# Patient Record
Sex: Male | Born: 1970 | Race: White | Hispanic: No | Marital: Single
Health system: Southern US, Academic
[De-identification: ages and names within clinical notes are randomized; demographics above are authoritative.]

## PROBLEM LIST (undated history)

## (undated) ENCOUNTER — Ambulatory Visit

## (undated) ENCOUNTER — Encounter

## (undated) ENCOUNTER — Encounter: Attending: Orthopaedic Surgery | Primary: Orthopaedic Surgery

## (undated) ENCOUNTER — Telehealth

## (undated) ENCOUNTER — Encounter: Attending: Rheumatology | Primary: Rheumatology

## (undated) ENCOUNTER — Encounter
Attending: Student in an Organized Health Care Education/Training Program | Primary: Student in an Organized Health Care Education/Training Program

## (undated) ENCOUNTER — Ambulatory Visit: Payer: PRIVATE HEALTH INSURANCE

## (undated) ENCOUNTER — Encounter: Attending: Internal Medicine | Primary: Internal Medicine

## (undated) ENCOUNTER — Telehealth
Attending: Student in an Organized Health Care Education/Training Program | Primary: Student in an Organized Health Care Education/Training Program

## (undated) ENCOUNTER — Ambulatory Visit: Attending: Rheumatology | Primary: Rheumatology

## (undated) ENCOUNTER — Ambulatory Visit: Attending: Critical Care Medicine | Primary: Critical Care Medicine

## (undated) ENCOUNTER — Ambulatory Visit
Payer: MEDICARE | Attending: Student in an Organized Health Care Education/Training Program | Primary: Student in an Organized Health Care Education/Training Program

## (undated) ENCOUNTER — Telehealth: Attending: Orthopaedic Surgery | Primary: Orthopaedic Surgery

## (undated) ENCOUNTER — Ambulatory Visit: Attending: Orthopaedic Surgery | Primary: Orthopaedic Surgery

## (undated) ENCOUNTER — Ambulatory Visit: Payer: MEDICARE

## (undated) ENCOUNTER — Telehealth: Attending: Ambulatory Care | Primary: Ambulatory Care

## (undated) ENCOUNTER — Ambulatory Visit: Attending: Pharmacist | Primary: Pharmacist

## (undated) ENCOUNTER — Encounter: Attending: Otolaryngology | Primary: Otolaryngology

## (undated) ENCOUNTER — Encounter: Attending: Ambulatory Care | Primary: Ambulatory Care

## (undated) ENCOUNTER — Ambulatory Visit: Payer: Medicaid (Managed Care)

## (undated) ENCOUNTER — Ambulatory Visit: Payer: MEDICAID | Attending: Medical | Primary: Medical

## (undated) ENCOUNTER — Encounter: Payer: MEDICARE | Attending: Internal Medicine | Primary: Internal Medicine

## (undated) ENCOUNTER — Ambulatory Visit: Payer: MEDICARE | Attending: Orthopaedic Surgery | Primary: Orthopaedic Surgery

## (undated) ENCOUNTER — Encounter: Attending: Critical Care Medicine | Primary: Critical Care Medicine

## (undated) ENCOUNTER — Ambulatory Visit
Attending: Student in an Organized Health Care Education/Training Program | Primary: Student in an Organized Health Care Education/Training Program

## (undated) ENCOUNTER — Ambulatory Visit: Attending: Ambulatory Care | Primary: Ambulatory Care

## (undated) ENCOUNTER — Ambulatory Visit: Payer: MEDICARE | Attending: Internal Medicine | Primary: Internal Medicine

## (undated) DIAGNOSIS — M0520 Rheumatoid vasculitis with rheumatoid arthritis of unspecified site: Secondary | ICD-10-CM

## (undated) DIAGNOSIS — F419 Anxiety disorder, unspecified: Secondary | ICD-10-CM

## (undated) DIAGNOSIS — B1920 Unspecified viral hepatitis C without hepatic coma: Secondary | ICD-10-CM

## (undated) DIAGNOSIS — T7840XA Allergy, unspecified, initial encounter: Secondary | ICD-10-CM

## (undated) DIAGNOSIS — J449 Chronic obstructive pulmonary disease, unspecified: Secondary | ICD-10-CM

## (undated) DIAGNOSIS — M8700 Idiopathic aseptic necrosis of unspecified bone: Secondary | ICD-10-CM

## (undated) DIAGNOSIS — F1010 Alcohol abuse, uncomplicated: Secondary | ICD-10-CM

## (undated) DIAGNOSIS — G4730 Sleep apnea, unspecified: Secondary | ICD-10-CM

## (undated) DIAGNOSIS — I4891 Unspecified atrial fibrillation: Secondary | ICD-10-CM

## (undated) DIAGNOSIS — F1910 Other psychoactive substance abuse, uncomplicated: Secondary | ICD-10-CM

## (undated) DIAGNOSIS — E785 Hyperlipidemia, unspecified: Secondary | ICD-10-CM

## (undated) DIAGNOSIS — K219 Gastro-esophageal reflux disease without esophagitis: Secondary | ICD-10-CM

## (undated) DIAGNOSIS — G8929 Other chronic pain: Secondary | ICD-10-CM

## (undated) DIAGNOSIS — R569 Unspecified convulsions: Secondary | ICD-10-CM

## (undated) DIAGNOSIS — J45909 Unspecified asthma, uncomplicated: Secondary | ICD-10-CM

## (undated) DIAGNOSIS — Z87898 Personal history of other specified conditions: Secondary | ICD-10-CM

## (undated) DIAGNOSIS — M549 Dorsalgia, unspecified: Secondary | ICD-10-CM

## (undated) DIAGNOSIS — G629 Polyneuropathy, unspecified: Secondary | ICD-10-CM

## (undated) DIAGNOSIS — F32A Depression, unspecified: Secondary | ICD-10-CM

## (undated) DIAGNOSIS — I10 Essential (primary) hypertension: Secondary | ICD-10-CM

## (undated) DIAGNOSIS — I714 Abdominal aortic aneurysm, without rupture, unspecified: Secondary | ICD-10-CM

## (undated) DIAGNOSIS — F329 Major depressive disorder, single episode, unspecified: Secondary | ICD-10-CM

## (undated) DIAGNOSIS — Z5189 Encounter for other specified aftercare: Secondary | ICD-10-CM

## (undated) HISTORY — PX: APPENDECTOMY: SHX54

## (undated) HISTORY — DX: Encounter for other specified aftercare: Z51.89

## (undated) HISTORY — PX: CARPAL TUNNEL RELEASE: SHX101

## (undated) HISTORY — PX: CERVICAL FUSION: SHX112

## (undated) HISTORY — DX: Hyperlipidemia, unspecified: E78.5

## (undated) HISTORY — DX: Abdominal aortic aneurysm, without rupture, unspecified: I71.40

## (undated) HISTORY — DX: Sleep apnea, unspecified: G47.30

## (undated) HISTORY — DX: Chronic obstructive pulmonary disease, unspecified: J44.9

## (undated) HISTORY — DX: Allergy, unspecified, initial encounter: T78.40XA

## (undated) MED ORDER — BD TUBERCULIN SYRINGE 1 ML 25 GAUGE X 5/8": each | 0 refills | 0 days

---
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
## 1998-10-08 ENCOUNTER — Emergency Department (HOSPITAL_COMMUNITY): Admission: EM | Admit: 1998-10-08 | Discharge: 1998-10-08 | Payer: Self-pay | Admitting: Emergency Medicine

## 1998-10-08 ENCOUNTER — Encounter: Payer: Self-pay | Admitting: Emergency Medicine

## 1998-10-15 ENCOUNTER — Encounter: Payer: Self-pay | Admitting: Emergency Medicine

## 1998-10-15 ENCOUNTER — Emergency Department (HOSPITAL_COMMUNITY): Admission: EM | Admit: 1998-10-15 | Discharge: 1998-10-15 | Payer: Self-pay | Admitting: Emergency Medicine

## 1999-05-22 ENCOUNTER — Emergency Department (HOSPITAL_COMMUNITY): Admission: EM | Admit: 1999-05-22 | Discharge: 1999-05-22 | Payer: Self-pay | Admitting: Emergency Medicine

## 1999-05-22 ENCOUNTER — Encounter: Payer: Self-pay | Admitting: Emergency Medicine

## 1999-05-23 ENCOUNTER — Emergency Department (HOSPITAL_COMMUNITY): Admission: EM | Admit: 1999-05-23 | Discharge: 1999-05-23 | Payer: Self-pay | Admitting: *Deleted

## 2003-09-19 ENCOUNTER — Emergency Department (HOSPITAL_COMMUNITY): Admission: EM | Admit: 2003-09-19 | Discharge: 2003-09-19 | Payer: Self-pay | Admitting: Emergency Medicine

## 2005-03-31 ENCOUNTER — Emergency Department (HOSPITAL_COMMUNITY): Admission: AD | Admit: 2005-03-31 | Discharge: 2005-03-31 | Payer: Self-pay | Admitting: Family Medicine

## 2006-03-06 ENCOUNTER — Ambulatory Visit: Payer: Self-pay | Admitting: Internal Medicine

## 2006-03-06 ENCOUNTER — Inpatient Hospital Stay (HOSPITAL_COMMUNITY): Admission: EM | Admit: 2006-03-06 | Discharge: 2006-03-07 | Payer: Self-pay | Admitting: Family Medicine

## 2006-03-07 ENCOUNTER — Encounter: Payer: Self-pay | Admitting: Cardiology

## 2006-03-07 HISTORY — PX: CARDIOVERSION: SHX1299

## 2006-04-14 ENCOUNTER — Ambulatory Visit: Payer: Self-pay | Admitting: Internal Medicine

## 2006-06-15 ENCOUNTER — Emergency Department (HOSPITAL_COMMUNITY): Admission: EM | Admit: 2006-06-15 | Discharge: 2006-06-15 | Payer: Self-pay | Admitting: Emergency Medicine

## 2007-12-09 ENCOUNTER — Emergency Department (HOSPITAL_COMMUNITY): Admission: EM | Admit: 2007-12-09 | Discharge: 2007-12-09 | Payer: Self-pay | Admitting: Emergency Medicine

## 2008-07-06 ENCOUNTER — Emergency Department (HOSPITAL_COMMUNITY): Admission: EM | Admit: 2008-07-06 | Discharge: 2008-07-06 | Payer: Self-pay | Admitting: Family Medicine

## 2009-06-08 DIAGNOSIS — I4891 Unspecified atrial fibrillation: Secondary | ICD-10-CM | POA: Insufficient documentation

## 2009-06-26 ENCOUNTER — Emergency Department (HOSPITAL_COMMUNITY): Admission: EM | Admit: 2009-06-26 | Discharge: 2009-06-26 | Payer: Self-pay | Admitting: Emergency Medicine

## 2009-08-16 ENCOUNTER — Emergency Department (HOSPITAL_COMMUNITY): Admission: EM | Admit: 2009-08-16 | Discharge: 2009-08-16 | Payer: Self-pay | Admitting: Emergency Medicine

## 2009-10-25 ENCOUNTER — Emergency Department (HOSPITAL_COMMUNITY): Admission: EM | Admit: 2009-10-25 | Discharge: 2009-10-25 | Payer: Self-pay | Admitting: Emergency Medicine

## 2009-10-27 ENCOUNTER — Emergency Department (HOSPITAL_BASED_OUTPATIENT_CLINIC_OR_DEPARTMENT_OTHER): Admission: EM | Admit: 2009-10-27 | Discharge: 2009-10-27 | Payer: Self-pay | Admitting: Emergency Medicine

## 2009-10-27 ENCOUNTER — Ambulatory Visit: Payer: Self-pay | Admitting: Diagnostic Radiology

## 2009-11-06 ENCOUNTER — Telehealth (INDEPENDENT_AMBULATORY_CARE_PROVIDER_SITE_OTHER): Payer: Self-pay

## 2009-12-10 ENCOUNTER — Emergency Department (HOSPITAL_COMMUNITY): Admission: EM | Admit: 2009-12-10 | Discharge: 2009-12-10 | Payer: Self-pay | Admitting: Emergency Medicine

## 2010-01-10 ENCOUNTER — Emergency Department (HOSPITAL_COMMUNITY)
Admission: EM | Admit: 2010-01-10 | Discharge: 2010-01-10 | Payer: Self-pay | Source: Home / Self Care | Admitting: Emergency Medicine

## 2010-02-09 ENCOUNTER — Ambulatory Visit: Payer: Self-pay | Admitting: Internal Medicine

## 2010-02-09 ENCOUNTER — Encounter: Payer: Self-pay | Admitting: Internal Medicine

## 2010-02-09 DIAGNOSIS — F17200 Nicotine dependence, unspecified, uncomplicated: Secondary | ICD-10-CM | POA: Insufficient documentation

## 2010-02-09 DIAGNOSIS — F1910 Other psychoactive substance abuse, uncomplicated: Secondary | ICD-10-CM | POA: Insufficient documentation

## 2010-02-09 HISTORY — DX: Other psychoactive substance abuse, uncomplicated: F19.10

## 2010-02-13 ENCOUNTER — Encounter: Payer: Self-pay | Admitting: Internal Medicine

## 2010-03-15 NOTE — Progress Notes (Signed)
  Phone Note Other Incoming   Request: Send information Summary of Call: Request for records received from Disability Determination Services. Forwarded to [employer].

## 2010-03-15 NOTE — Assessment & Plan Note (Signed)
Summary: ec6  Medications Added ASPIRIN 325 MG  TABS (ASPIRIN) as needed BENADRYL 25 MG CAPS (DIPHENHYDRAMINE HCL) 1 tab by mouth at bedtime        History of Present Illness: patient is a 40 year old with a history of atrial fibrillation in past (2008, underwent D/C cardioverisoin).  Also has a history of cocain use, pain pilss.  Has been through rehab.  Admits to using cocaine recently. The patient presents for continued care.  He notes occasional fast heart beat.  Longest lasts 30 minutes.  No dizziness.   No signif SOB.  No chest pains.  Still smoking.  Drinks 12 beers per day.  Current Medications (verified): 1)  Aspirin 325 Mg  Tabs (Aspirin) .... As Needed 2)  Benadryl 25 Mg Caps (Diphenhydramine Hcl) .[name].. 1 Tab By Mouth At Bedtime  Allergies: No Known Drug Allergies  Past History:  Past Medical History: Last updated: 06/08/2009  Atrial Fibrillation  alcohol abuse  cocaine abuse   Current Problems:  FIBRILLATION, ATRIAL (ICD-427.31)  Past Surgical History: Last updated: 06/08/2009 NONE  Family History: Last updated: 06/08/2009  Negative for A fib.  Social History: Last updated: 06/08/2009 Tobacco Use - Yes. 2010, trying to quit Alcohol Use - yes  heavy drinker 2010 Drug Use - yes,  last cocaine use 6-7 months ago May 2010  Review of Systems       All systems reviewed.  Neg  to the above problem except as noted above.  Vital Signs:  Patient profile:   40 year old male Height:      68 inches Weight:      169 pounds BMI:     25.79 Pulse rate:   100 / minute Resp:     14 per minute BP sitting:   145 / 85  (left arm)  Vitals Entered By: Kem Parkinson (February 09, 2010 2:53 PM)  Physical Exam  Additional Exam:  Patient is in NAD HEENT:  Normocephalic, atraumatic. EOMI, PERRLA.  Neck: JVP is normal. No thyromegaly. No bruits.  Lungs: clear to auscultation. No rales no wheezes.  Heart: Regular rate and rhythm. Normal S1, S2. No S3.   No  significant murmurs. PMI not displaced.  Abdomen:  Supple, nontender. Normal bowel sounds. No masses. No hepatomegaly.  Extremities:   Good distal pulses throughout. No lower extremity edema.  Musculoskeletal :moving all extremities.  Neuro:   alert and oriented x3.    EKG  Procedure date:  02/09/2010  Findings:      Sinus rhythm.   99 bpm.  Anterior MI  Impression & Recommendations:  Problem # 1:  FIBRILLATION, ATRIAL (ICD-427.31) Patient with infrequent spells by history.  I would keep him on ASA alone.  Cut back on ETOH.  No cocaine. His updated medication list for this problem includes:    Aspirin 325 Mg Tabs (Aspirin) .[name]... As needed  Problem # 2:  SUBSTANCE ABUSE (ICD-305.90) Counselled.  Problem # 3:  TOBACCO ABUSE (ICD-305.1) Counselled on cessation.  Patient Instructions: 1)  Availble as needed.  Otherwise f/u in 1 year.

## 2010-03-27 ENCOUNTER — Emergency Department (HOSPITAL_COMMUNITY): Payer: Self-pay

## 2010-03-27 ENCOUNTER — Emergency Department (HOSPITAL_COMMUNITY)
Admission: EM | Admit: 2010-03-27 | Discharge: 2010-03-28 | Disposition: A | Discharge status: Home / Self Care | Payer: Self-pay | Attending: Emergency Medicine | Admitting: Emergency Medicine

## 2010-03-27 DIAGNOSIS — F329 Major depressive disorder, single episode, unspecified: Secondary | ICD-10-CM | POA: Insufficient documentation

## 2010-03-27 DIAGNOSIS — R059 Cough, unspecified: Secondary | ICD-10-CM | POA: Insufficient documentation

## 2010-03-27 DIAGNOSIS — I4891 Unspecified atrial fibrillation: Secondary | ICD-10-CM | POA: Insufficient documentation

## 2010-03-27 DIAGNOSIS — R05 Cough: Secondary | ICD-10-CM | POA: Insufficient documentation

## 2010-03-27 DIAGNOSIS — R079 Chest pain, unspecified: Secondary | ICD-10-CM | POA: Insufficient documentation

## 2010-03-27 DIAGNOSIS — F3289 Other specified depressive episodes: Secondary | ICD-10-CM | POA: Insufficient documentation

## 2010-03-27 DIAGNOSIS — R1013 Epigastric pain: Secondary | ICD-10-CM | POA: Insufficient documentation

## 2010-03-27 DIAGNOSIS — E871 Hypo-osmolality and hyponatremia: Secondary | ICD-10-CM | POA: Insufficient documentation

## 2010-03-27 DIAGNOSIS — R0989 Other specified symptoms and signs involving the circulatory and respiratory systems: Secondary | ICD-10-CM | POA: Insufficient documentation

## 2010-03-27 DIAGNOSIS — Z8619 Personal history of other infectious and parasitic diseases: Secondary | ICD-10-CM | POA: Insufficient documentation

## 2010-03-27 DIAGNOSIS — M549 Dorsalgia, unspecified: Secondary | ICD-10-CM | POA: Insufficient documentation

## 2010-03-27 DIAGNOSIS — R197 Diarrhea, unspecified: Secondary | ICD-10-CM | POA: Insufficient documentation

## 2010-03-27 DIAGNOSIS — R0609 Other forms of dyspnea: Secondary | ICD-10-CM | POA: Insufficient documentation

## 2010-03-27 DIAGNOSIS — G8929 Other chronic pain: Secondary | ICD-10-CM | POA: Insufficient documentation

## 2010-03-27 DIAGNOSIS — F1910 Other psychoactive substance abuse, uncomplicated: Secondary | ICD-10-CM | POA: Insufficient documentation

## 2010-03-27 DIAGNOSIS — R112 Nausea with vomiting, unspecified: Secondary | ICD-10-CM | POA: Insufficient documentation

## 2010-03-27 LAB — BASIC METABOLIC PANEL
CO2: 15 mEq/L — ABNORMAL LOW (ref 19–32)
Calcium: 8.7 mg/dL (ref 8.4–10.5)
Chloride: 93 mEq/L — ABNORMAL LOW (ref 96–112)
Creatinine, Ser: 0.94 mg/dL (ref 0.4–1.5)
GFR calc Af Amer: >60 mL/min (ref >60)
GFR calc non Af Amer: >60 mL/min (ref >60)
Sodium: 127 mEq/L — ABNORMAL LOW (ref 135–145)

## 2010-03-27 LAB — HEPATIC FUNCTION PANEL
ALT: 45 U/L (ref 0–53)
AST: 41 U/L — ABNORMAL HIGH (ref 0–37)
Albumin: 4.2 g/dL (ref 3.5–5.2)
Alkaline Phosphatase: 57 U/L (ref 39–117)
Total Bilirubin: 0.7 mg/dL (ref 0.3–1.2)

## 2010-03-27 LAB — POCT I-STAT, CHEM 8
Calcium, Ion: 0.95 mmol/L — ABNORMAL LOW (ref 1.12–1.32)
Chloride: 101 mEq/L (ref 96–112)
Glucose, Bld: 79 mg/dL (ref 70–99)
HCT: 57 % — ABNORMAL HIGH (ref 39.0–52.0)
Hemoglobin: 19.4 g/dL — ABNORMAL HIGH (ref 13.0–17.0)

## 2010-03-27 LAB — RAPID URINE DRUG SCREEN, HOSP PERFORMED
Barbiturates: NOT DETECTED
Benzodiazepines: NOT DETECTED
Cocaine: POSITIVE — AB

## 2010-03-27 LAB — APTT: aPTT: 26 seconds (ref 24–37)

## 2010-03-27 LAB — ACETAMINOPHEN LEVEL: Acetaminophen (Tylenol), Serum: <10 ug/mL — ABNORMAL LOW (ref 10–30)

## 2010-03-27 LAB — DIFFERENTIAL
Basophils Absolute: 0 10*3/uL (ref 0.0–0.1)
Basophils Relative: 0 % (ref 0–1)
Neutrophils Relative %: 80 % — ABNORMAL HIGH (ref 43–77)

## 2010-03-27 LAB — CBC
MCH: 33.8 pg (ref 26.0–34.0)
Platelets: 192 10*3/uL (ref 150–400)
RDW: 12.8 % (ref 11.5–15.5)

## 2010-03-27 LAB — POCT CARDIAC MARKERS: CKMB, poc: 1 ng/mL (ref 1.0–8.0)

## 2010-03-27 LAB — PROTIME-INR: Prothrombin Time: 12.4 seconds (ref 11.6–15.2)

## 2010-03-28 LAB — URINALYSIS, ROUTINE W REFLEX MICROSCOPIC
Bilirubin Urine: NEGATIVE
Ketones, ur: 15 mg/dL — AB
Nitrite: NEGATIVE
Urine Glucose, Fasting: NEGATIVE mg/dL
Urobilinogen, UA: 1 mg/dL (ref 0.0–1.0)
pH: 5.5 (ref 5.0–8.0)

## 2010-04-24 LAB — URINALYSIS, ROUTINE W REFLEX MICROSCOPIC
Glucose, UA: NEGATIVE mg/dL
Hgb urine dipstick: NEGATIVE
pH: 7.5 (ref 5.0–8.0)

## 2010-04-24 LAB — DIFFERENTIAL
Lymphocytes Relative: 20 % (ref 12–46)
Monocytes Relative: 8 % (ref 3–12)
Neutro Abs: 8 10*3/uL — ABNORMAL HIGH (ref 1.7–7.7)
Neutrophils Relative %: 70 % (ref 43–77)

## 2010-04-24 LAB — COMPREHENSIVE METABOLIC PANEL
ALT: 30 U/L (ref 0–53)
Alkaline Phosphatase: 65 U/L (ref 39–117)
Creatinine, Ser: 0.83 mg/dL (ref 0.4–1.5)
GFR calc Af Amer: >60 mL/min (ref >60)
Potassium: 3.7 mEq/L (ref 3.5–5.1)
Total Protein: 7.5 g/dL (ref 6.0–8.3)

## 2010-04-24 LAB — CBC: WBC: 11.4 10*3/uL — ABNORMAL HIGH (ref 4.0–10.5)

## 2010-04-25 LAB — URINALYSIS, ROUTINE W REFLEX MICROSCOPIC
Bilirubin Urine: NEGATIVE
Glucose, UA: NEGATIVE mg/dL
Hgb urine dipstick: NEGATIVE
Ketones, ur: NEGATIVE mg/dL
Protein, ur: NEGATIVE mg/dL
Urobilinogen, UA: 0.2 mg/dL (ref 0.0–1.0)

## 2010-04-25 LAB — POCT CARDIAC MARKERS
CKMB, poc: <1 ng/mL — ABNORMAL LOW (ref 1.0–8.0)
Myoglobin, poc: 36.7 ng/mL (ref 12–200)
Troponin i, poc: <0.05 ng/mL (ref 0.00–0.09)

## 2010-04-25 LAB — DIFFERENTIAL
Basophils Absolute: 0 10*3/uL (ref 0.0–0.1)
Basophils Relative: 0 % (ref 0–1)
Eosinophils Absolute: 0.3 10*3/uL (ref 0.0–0.7)
Eosinophils Relative: 4 % (ref 0–5)
Lymphocytes Relative: 22 % (ref 12–46)
Monocytes Absolute: 0.7 10*3/uL (ref 0.1–1.0)

## 2010-04-25 LAB — CBC
HCT: 50.5 % (ref 39.0–52.0)
MCH: 33.3 pg (ref 26.0–34.0)
MCHC: 34.7 g/dL (ref 30.0–36.0)
MCV: 96 fL (ref 78.0–100.0)
Platelets: 188 10*3/uL (ref 150–400)
RDW: 13 % (ref 11.5–15.5)
WBC: 9.2 10*3/uL (ref 4.0–10.5)

## 2010-04-25 LAB — POCT I-STAT, CHEM 8
Chloride: 108 mEq/L (ref 96–112)
Creatinine, Ser: 1.1 mg/dL (ref 0.4–1.5)
Glucose, Bld: 107 mg/dL — ABNORMAL HIGH (ref 70–99)
HCT: 53 % — ABNORMAL HIGH (ref 39.0–52.0)
Hemoglobin: 18 g/dL — ABNORMAL HIGH (ref 13.0–17.0)
Potassium: 4 mEq/L (ref 3.5–5.1)
Sodium: 139 mEq/L (ref 135–145)

## 2010-04-25 LAB — RAPID URINE DRUG SCREEN, HOSP PERFORMED
Barbiturates: NOT DETECTED
Benzodiazepines: NOT DETECTED

## 2010-04-26 LAB — COMPREHENSIVE METABOLIC PANEL
AST: 33 U/L (ref 0–37)
Albumin: 4.1 g/dL (ref 3.5–5.2)
Alkaline Phosphatase: 66 U/L (ref 39–117)
Alkaline Phosphatase: 60 U/L (ref 39–117)
BUN: 5 mg/dL — ABNORMAL LOW (ref 6–23)
BUN: 14 mg/dL (ref 6–23)
CO2: 25 mEq/L (ref 19–32)
Chloride: 108 mEq/L (ref 96–112)
Chloride: 106 mEq/L (ref 96–112)
GFR calc Af Amer: >60 mL/min (ref >60)
GFR calc non Af Amer: >60 mL/min (ref >60)
Glucose, Bld: 79 mg/dL (ref 70–99)
Potassium: 3.9 mEq/L (ref 3.5–5.1)
Potassium: 4.1 mEq/L (ref 3.5–5.1)
Total Bilirubin: 0.6 mg/dL (ref 0.3–1.2)
Total Bilirubin: 0.7 mg/dL (ref 0.3–1.2)

## 2010-04-26 LAB — URINALYSIS, ROUTINE W REFLEX MICROSCOPIC
Bilirubin Urine: NEGATIVE
Hgb urine dipstick: NEGATIVE
Nitrite: NEGATIVE
Protein, ur: NEGATIVE mg/dL
Urobilinogen, UA: 0.2 mg/dL (ref 0.0–1.0)

## 2010-04-26 LAB — DIFFERENTIAL
Basophils Absolute: 0.2 10*3/uL — ABNORMAL HIGH (ref 0.0–0.1)
Basophils Relative: 3 % — ABNORMAL HIGH (ref 0–1)
Monocytes Absolute: 0.6 10*3/uL (ref 0.1–1.0)
Neutro Abs: 3.8 10*3/uL (ref 1.7–7.7)
Neutrophils Relative %: 51 % (ref 43–77)

## 2010-04-26 LAB — POCT CARDIAC MARKERS
CKMB, poc: 2 ng/mL (ref 1.0–8.0)
Troponin i, poc: <0.05 ng/mL (ref 0.00–0.09)

## 2010-04-26 LAB — CBC
HCT: 46.5 % (ref 39.0–52.0)
MCH: 33.1 pg (ref 26.0–34.0)
MCV: 94.5 fL (ref 78.0–100.0)
MCV: 93 fL (ref 78.0–100.0)
Platelets: 210 10*3/uL (ref 150–400)
RBC: 4.92 MIL/uL (ref 4.22–5.81)
RBC: 5.16 MIL/uL (ref 4.22–5.81)
WBC: 7.5 10*3/uL (ref 4.0–10.5)
WBC: 10.1 10*3/uL (ref 4.0–10.5)

## 2010-04-26 LAB — POCT TOXICOLOGY PANEL

## 2010-04-26 LAB — RAPID URINE DRUG SCREEN, HOSP PERFORMED: Barbiturates: NOT DETECTED

## 2010-05-22 LAB — POCT URINALYSIS DIP (DEVICE)
Protein, ur: NEGATIVE mg/dL
Urobilinogen, UA: 0.2 mg/dL (ref 0.0–1.0)

## 2010-05-22 LAB — GLUCOSE, CAPILLARY: Glucose-Capillary: 100 mg/dL — ABNORMAL HIGH (ref 70–99)

## 2010-06-29 NOTE — Assessment & Plan Note (Signed)
[facility]                            CARDIOLOGY OFFICE NOTE   NAME:Sick, MCCOY TESTA                     MRN:          [id number]  DATE:04/14/2006                            DOB:          27-Sep-1970    IDENTIFICATION:  Mr. Usery is a 40 year old gentleman who was admitted  in January to [facility] for atrial fibrillation.  He has a  history of polysubstance abuse.  Woke up feeling bad.  Came to the  emergency room, found to be in atrial fibrillation with a rate of 148  beats per minute.  He actually underwent cardioversion electrically for  this to sinus rhythm.  Sent home on Diltiazem.   In the interval, he has felt very well.  He has cut back on his smoking  and also on his drinking.  He is only drinking 12 beers per day at most,  previously quite a bit more.  He denies palpitations.  No dizziness.  No  significant shortness of breath.  He works in Data processing manager.  Is  active.   PAST MEDICAL HISTORY:  1. Tobacco use.  2. Atrial fibrillation.  3. History of cocaine use, post rehab 10 years ago.  4. ETOH abuse, cutting back.   ALLERGIES:  None.   FAMILY HISTORY:  Negative for A fib.   PHYSICAL EXAMINATION:  GENERAL:  On exam, the patient is in no distress.  VITAL SIGNS:  Blood pressure 126/81, pulse 78 and regular.  Weight 206.  NECK:  JVP is normal.  No thyromegaly.  No bruits.  LUNGS:  Clear to auscultation.  CARDIAC:  Regular rate and rhythm.  S1 and S2.  No S3.  No murmurs.  ABDOMEN:  Benign.  EXTREMITIES:  No edema.  Good pulses.    Echocardiogram done on January 25th:  Normal LV function.  Left atrium  is upper limits of normal.  RV function normal.  No significant valvular  disease.   A 12-lead EKG today, sinus rhythm at 76 beats per minute.  LVH by  voltage.   IMPRESSION:  1. Atrial fibrillation:  Patient had an alcohol binge before      admission.  He has cut significantly back on this.  I would still      keep  him on aspirin and Diltiazem for now.  At some point, may      consider backing off, but he is tolerating it well.  2. Health-care maintenance:  Will check a fasting lipid panel at his      convenience.  I encouraged him to stay active again.  I applauded      him on his tobacco and alcohol      reduction.  3. Follow up in January, sooner if problems develop.     Pricilla Riffle, MD, [facility]  Electronically Signed    PVR/MedQ  DD: 04/14/2006  DT: 04/15/2006  Job #: [id number]

## 2010-06-29 NOTE — Op Note (Signed)
NAMEJAIVIAN, BATTAGLINI              ACCOUNT NO.:  [id number]   MEDICAL RECORD NO.:  [id number]          PATIENT TYPE:  INP   LOCATION:  6533                         FACILITY:  [facility]   PHYSICIAN:  Luis Abed, MD, FACCDATE OF BIRTH:  [birth year]   DATE OF PROCEDURE:  DATE OF DISCHARGE:                               OPERATIVE REPORT   PROCEDURE PERFORMED:  Cardioversion.   The patient is to have cardioversion today and this was all range  appropriately.  Anesthesia was present.  The patient was given 250 mg of  IV Pentothal.  Anterior and posterior pads were in place.  The biphasic  defibrillator was used.  The patient received 100 joules of energy and  converted to normal sinus rhythm.  The patient tolerated the procedure  well.  He has converted to sinus rhythm.      Luis Abed, MD, [facility]  Electronically Signed     JDK/MEDQ  D:  03/07/2006  T:  03/07/2006  Job:  [id number]   cc:   Pricilla Riffle, MD, [facility]

## 2010-06-29 NOTE — Discharge Summary (Signed)
Blake Arnold, STREY NO.:  [id number]   MEDICAL RECORD NO.:  [id number]          PATIENT TYPE:  INP   LOCATION:  6533                         FACILITY:  [facility]   PHYSICIAN:  Salvadore Farber, MD  DATE OF BIRTH:  04-16-1970   DATE OF ADMISSION:  03/06/2006  DATE OF DISCHARGE:  03/07/2006                               DISCHARGE SUMMARY   PRIMARY CARDIOLOGIST:  Pricilla Riffle, MD, [facility].   The patient does not have a primary care [place].   PRINCIPAL DIAGNOSIS:  Atrial fibrillation with rapid ventricular  response.   SECONDARY DIAGNOSES:  1. Ongoing tobacco abuse, currently smoking one pack per day with a 50      pack-year history.  2. EtOH abuse, currently drinking one to two fifths of liquor per      night during the week and one to two cases on the weekend.  3. History of cocaine abuse, status post rehabilitation approximately      10 years ago.   ALLERGIES:  NO KNOWN DRUG ALLERGIES.   PROCEDURE:  Successful DC cardioversion and 2-D echocardiogram.   HISTORY OF PRESENT ILLNESS:  A 40 year old Caucasian male with prior  history of polysubstance abuse, last using cocaine about 10 -11 years  ago.  He continues to drink and smoke fairly heavily.  He awoke at 6:30  a.m. on the morning of March 06, 2006, with tachy palpitations, mild  chest pain, and shortness of breath.  He thought perhaps he was hungover  from drinking the night before and went into the shower and felt fairly  lightheaded.  Symptoms persisted for approximately 6 hours, prompting  him to present to the [facility] ED, where he was found to be in AFib  with rapid ventricular response at a rate of 148 beats per minute.  He  was treated with IV diltiazem, followed by IV diltiazem infusion, with  rates down into the 90s.  Despite improved rate control, he remained  symptomatic.  He was admitted for further evaluation.   HOSPITAL COURSE:  We placed him on Lovenox therapy and maintained IV  diltiazem and also added low dose oral beta blocker.  With this, his  rate came down into the 50s and 60s.  He was less symptomatic with rate  controlled.  A 2-D echocardiogram was performed on the morning of  March 07, 2006, revealing normal LV size and function without valvular  abnormalities and the decision was made to pursue cardioversion.  As  duration was less than 48 hours and he has been anticoagulated, he did  not require a TEE.  Cardioversion was performed with 1-100 joules  biphasic defibrillation successfully converting the patient to sinus  rhythm.  Anesthesia was on hand throughout.  Post cardioversion ECG  reveals sinus rhythm with a first-degree AV block.  The patient offers  no additional complaints of chest pain, shortness of breath,  palpitations or lightheadedness.  He is being discharged home today in  satisfactory condition.   The patient has been counseled on importance of both smoking and alcohol  cessation.  We have asked social  work to see him.  However, the patient  prefers to be discharged rather than wait to see social work.  He says  he is aware of what outpatient resources are available to him and will  pursue alcohol cessation through those resources.  He is willing to  accept a prescription for Chantix therapy.   As Mr. Ruz is less than 41 years of age without a prior history of  diabetes, stroke, vascular disease, or heart failure, he will not  require long-term Coumadin anticoagulation at this point and will plan  to use aspirin only.   DISCHARGE LABORATORY:  Hemoglobin 15.6, hematocrit 45.5, WBC 7.7,  platelets 232, MCV 90.6.  Sodium 141, potassium 3.5, chloride 108, CO2  27, BUN 13, creatinine 0.9, glucose 124.  PT 13.4, INR 1, PTT 34.  Total  bilirubin 0.6, alkaline phosphatase 69, AST 17, ALT 38, albumin 3.2.  Cardiac enzymes negative x3.  Total cholesterol 154, triglycerides 201,  HDL 35, LDL 79, calcium 8.9, magnesium 2.1.  TSH pending.   Free T4 is  pending   DISPOSITION:  The patient is being discharged home today in good  condition.   FOLLOWUP PLANS AND APPOINTMENTS:  1. The patient is asked to obtain primary care followup.  He does not      currently have a primary care physician.  2. We have arranged for him to follow up with Dr. Dietrich Pates on March      3 at 4 p.m.   DISCHARGE MEDICATIONS:  1. Aspirin 325 mg daily.  2. Diltiazem ER 240 mg daily.   PENDING LABORATORY STUDIES:  PFTs are pending.   DURATION DISCHARGE ENCOUNTER:  Forty-five minutes including physician  time.      Nicolasa Ducking, ANP      Salvadore Farber, MD  Electronically Signed    CB/MEDQ  D:  03/07/2006  T:  03/07/2006  Job:  [id number]

## 2010-06-29 NOTE — Consult Note (Signed)
NAME:  Blake Arnold, Blake Arnold NO.:  [id number]   MEDICAL RECORD NO.:  [id number]          PATIENT TYPE:  EMS   LOCATION:  MAJO                         FACILITY:  [facility]   PHYSICIAN:  Pricilla Riffle, MD, FACCDATE OF BIRTH:  08-08-1970   DATE OF CONSULTATION:  DATE OF DISCHARGE:                                 CONSULTATION   PRIMARY CARE PHYSICIAN:  The patient has none.   PRIMARY CARDIOLOGIST:  The patient is new to [facility] cardiology, being  seen by Dr. Dietrich Pates.   PATIENT PROFILE:  A 40 year old Caucasian male with history of cocaine,  alcohol and tobacco abuse (no cocaine x10 years) who presented this  afternoon with tachy palpitations found to be in a-fib with RVR.  1. A-fib with RVR.  2. Tobacco abuse.      a.     Approximately 50 pack-year history, is currently smoking one       pack per day, down from 2 to 3 packs a day.  3. History of cocaine abuse, status post rehab approximately 10 years      ago.      a.     Patient reports that he had chest pain and tachy       palpitations approximately 10 years ago in the setting of cocaine       use and was told that he may have had a small MI.  No ischemic       evaluation performed per patient.  4. ETOH abuse, currently drinking 1 to 2 fifths of with liquor per      night during the week and one to two cases of beer on the weekend.   HISTORY OF PRESENT ILLNESS:  A 40 year old Caucasian male with a history  of cocaine use and questionable history of MI approximately 10 to 11  years ago in the setting of cocaine use.  At that time, he had tachy  palpitations and chest pain.  Per patient, there was no ischemic  evaluation.  Over the years, he went to rehab for cocaine and has been  off cocaine for about 10 years.  He continues to smoke and drink  however.  He drank fairly heavily last night and this morning of awoke  at 6:30 a.m. with tachy palpitations.  He went into a shower thinking  that he was hung over and had  lightheaded and dizziness, mild chest pain  and shortness of breath.  Symptoms persisted for approximately 6 hours,  prompting him to present to the [facility] ED at about 12 or 12:30.  He  was found to be a-fib with rapid ventricular response at a rate of 148  beats per minute.  He is treated with diltiazem 20 mg IV push and  followed by diltiazem infusion at 15 mg an hour.  His rate is now down  into the low 100s and even in the 90s at times.  He feels better,  although notes some mild palpitations with mild dyspnea.  He had some  chest discomfort associated with a with his rapid response earlier, but  this  has now resolved.  He denies any PND, orthopnea, syncope, edema or  early satiety.   ALLERGIES:  NO KNOWN DRUG ALLERGIES.   HOME MEDICATIONS:  None.  In the ED, he received diltiazem as outlined  above.   FAMILY HISTORY:  Mother is age 13 and has a history of MI.  Father is  alive and well at age 13, does not know much about his health history.  He has 7brothers and 3 sisters.  All are alive and well.   SOCIAL HISTORY:  He lives in [place] with his mother.  He is  refurbishes office furniture for a living.  He has been smoking since  the age of 30, and average 2 to 3 packs per day over that period of  time, currently smoking about a pack a day.  He drinks 1 to 2 fifths of  liquor per night during the week and then will drink 1 to 2 cases of  beer on the weekends.  He last used cocaine about 10 years ago.  He does  not routinely exercise.   REVIEW OF SYSTEMS:  Positive for mild chest pain and shortness of breath  associated with tachy palpitations.  Positive for lightheadedness.  All  other systems reviewed and negative.   PHYSICAL EXAM:  VITAL SIGNS:  Temperature 97.6, heart rate is currently  103, down from 148.  Respirations 16.  Blood pressure is 103/62, pulse  ox 97% on 2 liters per minute.  GENERAL:  Pleasant white male in no acute distress, awake, alert and   oriented x3.  NECK:  Normal carotid upstrokes.  No bruits or JVD.  LUNGS:  Respirations regular, unlabored, clear to auscultation.  CARDIAC:  Regular S1, S2. Irregularly irregular S1, S2.  No S3, S4 or  murmurs.  ABDOMEN:  Round, soft, nontender, nondistended.  Bowel sounds present  x4.  EXTREMITIES:  Warm, dry, pink.  No clubbing, cyanosis or edema.  Dorsalis pedis, posterior tibial pulses 2+ and equal bilaterally.   ACCESSORY CLINICAL FINDINGS:  EKG shows a-fib at a rate of 132 beats per  minute with a normal axis, no acute ST-T changes.  Repeat EKG shows a-  fib at 97 beats per minute.   LAB WORK:  Hemoglobin 16.2, hematocrit 47.2, WBC 10.1, platelets 252,  sodium 141,1 potassium 4.1, chloride 108, CO2 26.2, BUN 19, creatinine  1.1, glucose 80, CK-MB 1.3, troponin-I less than 0.05.  BNP 61.   ASSESSMENT/PLAN:  1. Atrial fibrillation with RPR, onset approximately 6:30 a.m.  Rate      has decreased with IV diltiazem to the low 120s and even to the 90s      at times.  Plan to admit and cycle enzymes, as he did have some      chest pain.  Check 2-D echocardiogram to rule out structural      abnormality, as well as TFTs and magnesium.  His other electrolytes      are within normal limits.  Will add Lovenox and beta blocker, and      if he does not convert by the a.m., will have  to strongly consider      DC CV, as he presents with onset less than 48 hours.  There is no      history of CAD, CVA, hypertension, diabetes, CHF, and his age is      less than 43 and thus likely does not need long-term Coumadin.      However, may need short term  Coumadin if cardioverted.  His ETOH      abuse may be prohibited with regards to the use of Coumadin.  2. ETOH abuse.  Cessation strongly advised.  He says he is feeling a      little edgy now.  Will write for DT prophylaxis and ask for social      work to provide with outpatient resource. 3. Tobacco abuse.  Smoking cessation strongly advised.  He  says he      knows he needs to quit.  Will write for a nicotine patch and ask      for a cessation consult.      Nicolasa Ducking, ANP      Pricilla Riffle, MD, [facility]  Electronically Signed    CB/MEDQ  D:  03/06/2006  T:  03/06/2006  Job:  [id number]

## 2010-07-31 ENCOUNTER — Emergency Department (HOSPITAL_COMMUNITY)
Admission: EM | Admit: 2010-07-31 | Discharge: 2010-08-01 | Disposition: A | Discharge status: Home / Self Care | Payer: Self-pay | Attending: Emergency Medicine | Admitting: Emergency Medicine

## 2010-07-31 DIAGNOSIS — I4891 Unspecified atrial fibrillation: Secondary | ICD-10-CM | POA: Insufficient documentation

## 2010-07-31 DIAGNOSIS — F1910 Other psychoactive substance abuse, uncomplicated: Secondary | ICD-10-CM | POA: Insufficient documentation

## 2010-07-31 DIAGNOSIS — F17200 Nicotine dependence, unspecified, uncomplicated: Secondary | ICD-10-CM | POA: Insufficient documentation

## 2010-07-31 LAB — ETHANOL: Alcohol, Ethyl (B): 16 mg/dL — ABNORMAL HIGH (ref 0–11)

## 2010-07-31 LAB — URINALYSIS, ROUTINE W REFLEX MICROSCOPIC
Hgb urine dipstick: NEGATIVE
Ketones, ur: NEGATIVE mg/dL
Protein, ur: NEGATIVE mg/dL
Urobilinogen, UA: 0.2 mg/dL (ref 0.0–1.0)

## 2010-07-31 LAB — CBC
HCT: 47.2 % (ref 39.0–52.0)
MCHC: 35.6 g/dL (ref 30.0–36.0)
RDW: 12.2 % (ref 11.5–15.5)

## 2010-07-31 LAB — COMPREHENSIVE METABOLIC PANEL
ALT: 28 U/L (ref 0–53)
AST: 19 U/L (ref 0–37)
Albumin: 3.7 g/dL (ref 3.5–5.2)
Alkaline Phosphatase: 63 U/L (ref 39–117)
BUN: 4 mg/dL — ABNORMAL LOW (ref 6–23)
Chloride: 101 mEq/L (ref 96–112)
Potassium: 3.8 mEq/L (ref 3.5–5.1)
Sodium: 139 mEq/L (ref 135–145)
Total Bilirubin: 0.4 mg/dL (ref 0.3–1.2)

## 2010-07-31 LAB — DIFFERENTIAL
Basophils Absolute: 0.1 10*3/uL (ref 0.0–0.1)
Basophils Relative: 1 % (ref 0–1)
Eosinophils Relative: 1 % (ref 0–5)
Monocytes Absolute: 0.6 10*3/uL (ref 0.1–1.0)

## 2010-07-31 LAB — RAPID URINE DRUG SCREEN, HOSP PERFORMED
Opiates: NOT DETECTED
Tetrahydrocannabinol: NOT DETECTED

## 2010-08-04 ENCOUNTER — Emergency Department (HOSPITAL_COMMUNITY)
Admission: EM | Admit: 2010-08-04 | Discharge: 2010-08-05 | Disposition: A | Discharge status: Home / Self Care | Payer: Self-pay | Attending: Emergency Medicine | Admitting: Emergency Medicine

## 2010-08-04 DIAGNOSIS — F1010 Alcohol abuse, uncomplicated: Secondary | ICD-10-CM | POA: Insufficient documentation

## 2010-08-04 DIAGNOSIS — F17200 Nicotine dependence, unspecified, uncomplicated: Secondary | ICD-10-CM | POA: Insufficient documentation

## 2010-08-04 DIAGNOSIS — Z8619 Personal history of other infectious and parasitic diseases: Secondary | ICD-10-CM | POA: Insufficient documentation

## 2010-08-04 LAB — URINALYSIS, ROUTINE W REFLEX MICROSCOPIC
Bilirubin Urine: NEGATIVE
Hgb urine dipstick: NEGATIVE
Ketones, ur: NEGATIVE mg/dL
Nitrite: NEGATIVE
Protein, ur: NEGATIVE mg/dL
Urobilinogen, UA: 0.2 mg/dL (ref 0.0–1.0)
pH: 6.5 (ref 5.0–8.0)

## 2010-08-04 LAB — DIFFERENTIAL
Basophils Absolute: 0 10*3/uL (ref 0.0–0.1)
Basophils Relative: 1 % (ref 0–1)
Eosinophils Relative: 1 % (ref 0–5)
Monocytes Absolute: 0.6 10*3/uL (ref 0.1–1.0)
Monocytes Relative: 7 % (ref 3–12)

## 2010-08-04 LAB — COMPREHENSIVE METABOLIC PANEL
AST: 23 U/L (ref 0–37)
Albumin: 4.1 g/dL (ref 3.5–5.2)
Alkaline Phosphatase: 63 U/L (ref 39–117)
BUN: 6 mg/dL (ref 6–23)
CO2: 26 mEq/L (ref 19–32)
Chloride: 100 mEq/L (ref 96–112)
Creatinine, Ser: 0.69 mg/dL (ref 0.50–1.35)
GFR calc non Af Amer: >60 mL/min (ref >60)
Potassium: 3.3 mEq/L — ABNORMAL LOW (ref 3.5–5.1)
Total Bilirubin: 0.4 mg/dL (ref 0.3–1.2)

## 2010-08-04 LAB — ETHANOL: Alcohol, Ethyl (B): <11 mg/dL (ref 0–11)

## 2010-08-04 LAB — RAPID URINE DRUG SCREEN, HOSP PERFORMED
Barbiturates: NOT DETECTED
Cocaine: NOT DETECTED
Opiates: NOT DETECTED

## 2010-08-04 LAB — CBC
MCH: 32 pg (ref 26.0–34.0)
MCHC: 34.8 g/dL (ref 30.0–36.0)
RDW: 12.5 % (ref 11.5–15.5)

## 2010-08-06 ENCOUNTER — Emergency Department (HOSPITAL_COMMUNITY)
Admission: EM | Admit: 2010-08-06 | Discharge: 2010-08-06 | Disposition: A | Discharge status: Home / Self Care | Payer: Self-pay | Attending: Emergency Medicine | Admitting: Emergency Medicine

## 2010-08-06 ENCOUNTER — Emergency Department (HOSPITAL_COMMUNITY): Payer: Self-pay

## 2010-08-06 DIAGNOSIS — Z8619 Personal history of other infectious and parasitic diseases: Secondary | ICD-10-CM | POA: Insufficient documentation

## 2010-08-06 DIAGNOSIS — M549 Dorsalgia, unspecified: Secondary | ICD-10-CM | POA: Insufficient documentation

## 2010-08-06 DIAGNOSIS — Z79899 Other long term (current) drug therapy: Secondary | ICD-10-CM | POA: Insufficient documentation

## 2010-08-06 DIAGNOSIS — I4891 Unspecified atrial fibrillation: Secondary | ICD-10-CM | POA: Insufficient documentation

## 2010-08-06 DIAGNOSIS — G8929 Other chronic pain: Secondary | ICD-10-CM | POA: Insufficient documentation

## 2010-08-06 DIAGNOSIS — R079 Chest pain, unspecified: Secondary | ICD-10-CM | POA: Insufficient documentation

## 2010-08-06 LAB — CBC
HCT: 43.8 % (ref 39.0–52.0)
MCH: 32.9 pg (ref 26.0–34.0)
MCHC: 35.6 g/dL (ref 30.0–36.0)
MCV: 92.4 fL (ref 78.0–100.0)
Platelets: 171 10*3/uL (ref 150–400)
RDW: 12.5 % (ref 11.5–15.5)

## 2010-08-06 LAB — DIFFERENTIAL
Eosinophils Absolute: 0.2 10*3/uL (ref 0.0–0.7)
Eosinophils Relative: 2 % (ref 0–5)
Lymphocytes Relative: 22 % (ref 12–46)
Lymphs Abs: 2.2 10*3/uL (ref 0.7–4.0)
Monocytes Absolute: 0.7 10*3/uL (ref 0.1–1.0)
Monocytes Relative: 7 % (ref 3–12)

## 2010-08-06 LAB — POCT I-STAT, CHEM 8
Calcium, Ion: 1.17 mmol/L (ref 1.12–1.32)
Chloride: 104 mEq/L (ref 96–112)
Creatinine, Ser: 0.9 mg/dL (ref 0.50–1.35)
Glucose, Bld: 97 mg/dL (ref 70–99)
HCT: 47 % (ref 39.0–52.0)
Potassium: 3.8 mEq/L (ref 3.5–5.1)

## 2010-08-06 LAB — CK TOTAL AND CKMB (NOT AT ARMC): Relative Index: INVALID (ref 0.0–2.5)

## 2010-08-21 ENCOUNTER — Emergency Department (HOSPITAL_COMMUNITY): Payer: Self-pay

## 2010-08-21 ENCOUNTER — Inpatient Hospital Stay (HOSPITAL_COMMUNITY)
Admission: EM | Admit: 2010-08-21 | Discharge: 2010-08-24 | DRG: 066 | Discharge status: Against Medical Advice | Payer: Self-pay | Attending: Family Medicine | Admitting: Family Medicine

## 2010-08-21 DIAGNOSIS — I6350 Cerebral infarction due to unspecified occlusion or stenosis of unspecified cerebral artery: Principal | ICD-10-CM | POA: Diagnosis present

## 2010-08-21 DIAGNOSIS — Z7982 Long term (current) use of aspirin: Secondary | ICD-10-CM

## 2010-08-21 DIAGNOSIS — I4891 Unspecified atrial fibrillation: Secondary | ICD-10-CM | POA: Diagnosis present

## 2010-08-21 DIAGNOSIS — F17200 Nicotine dependence, unspecified, uncomplicated: Secondary | ICD-10-CM | POA: Diagnosis present

## 2010-08-21 DIAGNOSIS — F1020 Alcohol dependence, uncomplicated: Secondary | ICD-10-CM | POA: Diagnosis present

## 2010-08-21 DIAGNOSIS — F1010 Alcohol abuse, uncomplicated: Secondary | ICD-10-CM

## 2010-08-21 LAB — RAPID URINE DRUG SCREEN, HOSP PERFORMED
Barbiturates: NOT DETECTED
Benzodiazepines: NOT DETECTED
Cocaine: NOT DETECTED
Opiates: NOT DETECTED

## 2010-08-21 LAB — CK TOTAL AND CKMB (NOT AT ARMC)
Relative Index: 2.3 (ref 0.0–2.5)
Total CK: 173 U/L (ref 7–232)

## 2010-08-21 LAB — COMPREHENSIVE METABOLIC PANEL
ALT: 30 U/L (ref 0–53)
AST: 27 U/L (ref 0–37)
Albumin: 3.9 g/dL (ref 3.5–5.2)
CO2: 24 mEq/L (ref 19–32)
Calcium: 8.8 mg/dL (ref 8.4–10.5)
Chloride: 106 mEq/L (ref 96–112)
Creatinine, Ser: 0.61 mg/dL (ref 0.50–1.35)
GFR calc non Af Amer: >60 mL/min (ref >60)
Sodium: 143 mEq/L (ref 135–145)

## 2010-08-21 LAB — URINALYSIS, ROUTINE W REFLEX MICROSCOPIC
Bilirubin Urine: NEGATIVE
Glucose, UA: NEGATIVE mg/dL
Hgb urine dipstick: NEGATIVE
Ketones, ur: NEGATIVE mg/dL
Specific Gravity, Urine: 1.012 (ref 1.005–1.030)
pH: 5 (ref 5.0–8.0)

## 2010-08-21 LAB — DIFFERENTIAL
Eosinophils Relative: 1 % (ref 0–5)
Lymphocytes Relative: 29 % (ref 12–46)
Lymphs Abs: 2.6 10*3/uL (ref 0.7–4.0)
Monocytes Relative: 7 % (ref 3–12)

## 2010-08-21 LAB — POCT I-STAT, CHEM 8
BUN: 3 mg/dL — ABNORMAL LOW (ref 6–23)
Calcium, Ion: 1.06 mmol/L — ABNORMAL LOW (ref 1.12–1.32)
Chloride: 106 mEq/L (ref 96–112)
Glucose, Bld: 84 mg/dL (ref 70–99)
HCT: 52 % (ref 39.0–52.0)
Potassium: 3.7 mEq/L (ref 3.5–5.1)

## 2010-08-21 LAB — CBC
HCT: 47.1 % (ref 39.0–52.0)
MCH: 33.3 pg (ref 26.0–34.0)
MCV: 91.8 fL (ref 78.0–100.0)
RBC: 5.13 MIL/uL (ref 4.22–5.81)
RDW: 13.3 % (ref 11.5–15.5)
WBC: 8.7 10*3/uL (ref 4.0–10.5)

## 2010-08-21 LAB — GLUCOSE, CAPILLARY: Glucose-Capillary: 91 mg/dL (ref 70–99)

## 2010-08-21 LAB — APTT: aPTT: 27 seconds (ref 24–37)

## 2010-08-21 LAB — TROPONIN I: Troponin I: <0.3 ng/mL (ref <0.30)

## 2010-08-22 DIAGNOSIS — I6789 Other cerebrovascular disease: Secondary | ICD-10-CM

## 2010-08-22 LAB — CARDIAC PANEL(CRET KIN+CKTOT+MB+TROPI)
CK, MB: 2.6 ng/mL (ref 0.3–4.0)
Relative Index: 2.3 (ref 0.0–2.5)
Total CK: 115 U/L (ref 7–232)
Troponin I: <0.3 ng/mL (ref <0.30)

## 2010-08-22 LAB — LIPID PANEL
Cholesterol: 172 mg/dL (ref 0–200)
HDL: 73 mg/dL (ref >39)
Triglycerides: 67 mg/dL (ref <150)
VLDL: 13 mg/dL (ref 0–40)

## 2010-08-22 LAB — COMPREHENSIVE METABOLIC PANEL
AST: 26 U/L (ref 0–37)
Albumin: 3.2 g/dL — ABNORMAL LOW (ref 3.5–5.2)
BUN: 12 mg/dL (ref 6–23)
Calcium: 8.7 mg/dL (ref 8.4–10.5)
Chloride: 101 mEq/L (ref 96–112)
Creatinine, Ser: 0.75 mg/dL (ref 0.50–1.35)
GFR calc non Af Amer: >60 mL/min (ref >60)
Total Bilirubin: 0.8 mg/dL (ref 0.3–1.2)

## 2010-08-22 LAB — APTT: aPTT: 28 seconds (ref 24–37)

## 2010-08-22 LAB — CBC
HCT: 45.7 % (ref 39.0–52.0)
Hemoglobin: 15.9 g/dL (ref 13.0–17.0)
MCV: 93.5 fL (ref 78.0–100.0)
RBC: 4.89 MIL/uL (ref 4.22–5.81)
RDW: 13.4 % (ref 11.5–15.5)
WBC: 7.4 10*3/uL (ref 4.0–10.5)

## 2010-08-22 LAB — HEMOGLOBIN A1C: Mean Plasma Glucose: 97 mg/dL (ref <117)

## 2010-08-22 LAB — PROTIME-INR: INR: 1 (ref 0.00–1.49)

## 2010-08-23 ENCOUNTER — Inpatient Hospital Stay (HOSPITAL_COMMUNITY): Payer: Self-pay

## 2010-08-23 LAB — BASIC METABOLIC PANEL
BUN: 8 mg/dL (ref 6–23)
Chloride: 100 mEq/L (ref 96–112)
Creatinine, Ser: 0.75 mg/dL (ref 0.50–1.35)
Glucose, Bld: 120 mg/dL — ABNORMAL HIGH (ref 70–99)
Potassium: 3.7 mEq/L (ref 3.5–5.1)

## 2010-08-24 LAB — CBC
HCT: 46.7 % (ref 39.0–52.0)
Hemoglobin: 16.3 g/dL (ref 13.0–17.0)
MCH: 32.3 pg (ref 26.0–34.0)
MCHC: 34.9 g/dL (ref 30.0–36.0)
RDW: 13 % (ref 11.5–15.5)

## 2010-08-24 LAB — BASIC METABOLIC PANEL
BUN: 7 mg/dL (ref 6–23)
Calcium: 9 mg/dL (ref 8.4–10.5)
Creatinine, Ser: 0.6 mg/dL (ref 0.50–1.35)
GFR calc non Af Amer: >60 mL/min (ref >60)
Glucose, Bld: 94 mg/dL (ref 70–99)

## 2010-08-28 ENCOUNTER — Emergency Department (HOSPITAL_COMMUNITY)
Admission: EM | Admit: 2010-08-28 | Discharge: 2010-08-28 | Disposition: A | Discharge status: Home / Self Care | Payer: Self-pay | Attending: Emergency Medicine | Admitting: Emergency Medicine

## 2010-08-28 DIAGNOSIS — Z79899 Other long term (current) drug therapy: Secondary | ICD-10-CM | POA: Insufficient documentation

## 2010-08-28 DIAGNOSIS — F17200 Nicotine dependence, unspecified, uncomplicated: Secondary | ICD-10-CM | POA: Insufficient documentation

## 2010-08-28 DIAGNOSIS — F1020 Alcohol dependence, uncomplicated: Secondary | ICD-10-CM | POA: Insufficient documentation

## 2010-08-28 DIAGNOSIS — Z7982 Long term (current) use of aspirin: Secondary | ICD-10-CM | POA: Insufficient documentation

## 2010-08-28 DIAGNOSIS — B1920 Unspecified viral hepatitis C without hepatic coma: Secondary | ICD-10-CM | POA: Insufficient documentation

## 2010-08-28 DIAGNOSIS — I4891 Unspecified atrial fibrillation: Secondary | ICD-10-CM | POA: Insufficient documentation

## 2010-08-28 DIAGNOSIS — I10 Essential (primary) hypertension: Secondary | ICD-10-CM | POA: Insufficient documentation

## 2010-08-28 DIAGNOSIS — R209 Unspecified disturbances of skin sensation: Secondary | ICD-10-CM | POA: Insufficient documentation

## 2010-08-28 DIAGNOSIS — Z8673 Personal history of transient ischemic attack (TIA), and cerebral infarction without residual deficits: Secondary | ICD-10-CM | POA: Insufficient documentation

## 2010-08-28 LAB — BASIC METABOLIC PANEL
BUN: 9 mg/dL (ref 6–23)
Calcium: 8.9 mg/dL (ref 8.4–10.5)
Creatinine, Ser: 0.74 mg/dL (ref 0.50–1.35)
GFR calc non Af Amer: >60 mL/min (ref >60)
Glucose, Bld: 99 mg/dL (ref 70–99)

## 2010-08-31 ENCOUNTER — Emergency Department (HOSPITAL_COMMUNITY)
Admission: EM | Admit: 2010-08-31 | Discharge: 2010-09-01 | Discharge status: Against Medical Advice | Payer: Self-pay | Attending: Emergency Medicine | Admitting: Emergency Medicine

## 2010-08-31 DIAGNOSIS — I10 Essential (primary) hypertension: Secondary | ICD-10-CM | POA: Insufficient documentation

## 2010-08-31 DIAGNOSIS — F1910 Other psychoactive substance abuse, uncomplicated: Secondary | ICD-10-CM | POA: Insufficient documentation

## 2010-08-31 DIAGNOSIS — Z8619 Personal history of other infectious and parasitic diseases: Secondary | ICD-10-CM | POA: Insufficient documentation

## 2010-08-31 DIAGNOSIS — Z8673 Personal history of transient ischemic attack (TIA), and cerebral infarction without residual deficits: Secondary | ICD-10-CM | POA: Insufficient documentation

## 2010-08-31 LAB — DIFFERENTIAL
Basophils Absolute: 0.1 10*3/uL (ref 0.0–0.1)
Basophils Relative: 1 % (ref 0–1)
Eosinophils Absolute: 0.2 10*3/uL (ref 0.0–0.7)
Eosinophils Relative: 2 % (ref 0–5)
Lymphocytes Relative: 24 % (ref 12–46)
Monocytes Absolute: 1 10*3/uL (ref 0.1–1.0)

## 2010-08-31 LAB — ETHANOL
Alcohol, Ethyl (B): 248 mg/dL — ABNORMAL HIGH (ref 0–11)
Alcohol, Ethyl (B): <11 mg/dL (ref 0–11)

## 2010-08-31 LAB — RAPID URINE DRUG SCREEN, HOSP PERFORMED: Benzodiazepines: NOT DETECTED

## 2010-08-31 LAB — CBC
MCH: 32.3 pg (ref 26.0–34.0)
MCV: 94.7 fL (ref 78.0–100.0)
Platelets: 251 10*3/uL (ref 150–400)
RBC: 5.14 MIL/uL (ref 4.22–5.81)

## 2010-08-31 LAB — COMPREHENSIVE METABOLIC PANEL
Albumin: 4 g/dL (ref 3.5–5.2)
BUN: 8 mg/dL (ref 6–23)
Chloride: 103 mEq/L (ref 96–112)
Creatinine, Ser: 0.73 mg/dL (ref 0.50–1.35)
GFR calc non Af Amer: >60 mL/min (ref >60)
Total Bilirubin: 0.3 mg/dL (ref 0.3–1.2)

## 2010-08-31 LAB — SALICYLATE LEVEL: Salicylate Lvl: <2 mg/dL — ABNORMAL LOW (ref 2.8–20.0)

## 2010-09-04 NOTE — Consult Note (Signed)
  NAMEADIAN, JABLONOWSKI NO.:  [id number]  MEDICAL RECORD NO.:  [id number]  LOCATION:                                 FACILITY:  PHYSICIAN:  Levert Feinstein, MD          DATE OF BIRTH:  05/25/1970  DATE OF CONSULTATION: DATE OF DISCHARGE:                                CONSULTATION   Consult is from Dr. Carleene Cooper.  CHIEF COMPLAINT:  Left-sided weakness and numbness.  HISTORY OF PRESENT ILLNESS:  The patient is a 40 year old right-handed Caucasian male, presented to the hospital with acute onset of left side numbness.  He has past medical history of alcohol abuse, cocaine abuse, reported most recent use 4 days ago, also has a history of paroxysmal atrial fibrillation, yesterday around 8:30 p.m., while drinking beers, he noted sudden onset of left facial arm, trunk, leg numbness, he went to sleep afterwards, wake up in the morning, noticed consistent numbness, also mild slurred speech, and weakness of left hand, prompted his ER visit.  PERTINENT REVIEW OF SYSTEM:  He denied chest pain.  Has bifrontal headache.  PAST MEDICAL HISTORY:  Paroxysmal atrial fibrillation and was told related to his alcohol and cocaine use, was not treated, hepatitis C, and multi substance abuse including prescription medications, cocaine, and alcohol.  MEDICATIONS:  Clonazepam p.r.n.  SURGICAL HISTORY:  None.  FAMILY HISTORY:  Noncontributory.  SOCIAL HISTORY:  He is unemployed, smoke a pack a day, drink beers.  PHYSICAL EXAMINATION:  VITAL SIGNS:  Temperature is afebrile, blood pressure 129/90, heart rate of 89, respirations 18. GENERAL:  He is awake, alert, anxious, following commands. CARDIAC:  Regular rate and rhythm. HEENT:  Cranial nerves II-XII.  Pupils are equal, round, and reactive to light.  Extraocular movements were full.  Facial sensation and strength was normal.  Uvula and tongue midline.  Head turning and shoulder shrugging were normal and symmetric. MOTOR:   He has mild to weak grip of his left hand. SENSORY:  Decreased light touch on the left side.  No dysmetria.  Deep tendon reflexes were brisk and symmetric.  Plantar responses were flexor.  CT of the brain without contrast, no acute lesion.  Chest x-ray was negative.  LABORATORY:  UDS was negative.  Alcohol level was high at 299.  CBC shows elevated hemoglobin 17.1.  Normal CMP.  CPK was 84.  ASSESSMENT/PLAN:  A 40 year old right-handed, Caucasian male, with history of alcohol abuse, cocaine abuse, and now presenting with left side weakness, numbness, likely right internal capsule/thalamus versus right subcortical white matter small vessel disease. 1. MRI of the brain. 2. MRA of the brain. 3. Ultrasound of carotid artery. 4. Echocardiogram. 5. Aspirin 81 mg. 6. Alcohol withdrawal precaution. 7. Laboratory evaluation for stroke stratification.     Levert Feinstein, MD     YY/MEDQ  D:  08/22/2010  T:  08/22/2010  Job:  [id number]  Electronically Signed by Levert Feinstein MD on 09/04/2010 08:42:42 AM

## 2010-09-11 ENCOUNTER — Emergency Department (HOSPITAL_COMMUNITY): Payer: Self-pay

## 2010-09-11 ENCOUNTER — Emergency Department (HOSPITAL_COMMUNITY)
Admission: EM | Admit: 2010-09-11 | Discharge: 2010-09-11 | Disposition: A | Discharge status: Home / Self Care | Payer: Self-pay | Attending: Emergency Medicine | Admitting: Emergency Medicine

## 2010-09-11 DIAGNOSIS — F1010 Alcohol abuse, uncomplicated: Secondary | ICD-10-CM | POA: Insufficient documentation

## 2010-09-11 DIAGNOSIS — F1910 Other psychoactive substance abuse, uncomplicated: Secondary | ICD-10-CM | POA: Insufficient documentation

## 2010-09-11 DIAGNOSIS — F17200 Nicotine dependence, unspecified, uncomplicated: Secondary | ICD-10-CM | POA: Insufficient documentation

## 2010-09-11 DIAGNOSIS — R079 Chest pain, unspecified: Secondary | ICD-10-CM | POA: Insufficient documentation

## 2010-09-11 DIAGNOSIS — I4891 Unspecified atrial fibrillation: Secondary | ICD-10-CM | POA: Insufficient documentation

## 2010-09-11 DIAGNOSIS — I10 Essential (primary) hypertension: Secondary | ICD-10-CM | POA: Insufficient documentation

## 2010-09-11 DIAGNOSIS — F1020 Alcohol dependence, uncomplicated: Secondary | ICD-10-CM | POA: Insufficient documentation

## 2010-09-11 LAB — CBC
HCT: 46 % (ref 39.0–52.0)
Hemoglobin: 15.8 g/dL (ref 13.0–17.0)
MCH: 32.4 pg (ref 26.0–34.0)
MCHC: 34.3 g/dL (ref 30.0–36.0)
MCV: 94.3 fL (ref 78.0–100.0)
Platelets: 225 10*3/uL (ref 150–400)
RBC: 4.88 MIL/uL (ref 4.22–5.81)
RDW: 13.5 % (ref 11.5–15.5)
WBC: 11.8 10*3/uL — ABNORMAL HIGH (ref 4.0–10.5)

## 2010-09-11 LAB — COMPREHENSIVE METABOLIC PANEL
ALT: 40 U/L (ref 0–53)
AST: 36 U/L (ref 0–37)
Calcium: 9.3 mg/dL (ref 8.4–10.5)
GFR calc Af Amer: >60 mL/min (ref >60)
Sodium: 139 mEq/L (ref 135–145)
Total Protein: 8.3 g/dL (ref 6.0–8.3)

## 2010-09-11 LAB — ETHANOL
Alcohol, Ethyl (B): 204 mg/dL — ABNORMAL HIGH (ref 0–11)
Alcohol, Ethyl (B): 75 mg/dL — ABNORMAL HIGH (ref 0–11)

## 2010-09-11 LAB — DIFFERENTIAL
Basophils Absolute: 0 10*3/uL (ref 0.0–0.1)
Basophils Relative: 0 % (ref 0–1)
Eosinophils Absolute: 0.1 10*3/uL (ref 0.0–0.7)
Eosinophils Relative: 1 % (ref 0–5)
Lymphocytes Relative: 17 % (ref 12–46)
Lymphs Abs: 2 10*3/uL (ref 0.7–4.0)
Monocytes Absolute: 0.8 10*3/uL (ref 0.1–1.0)
Monocytes Relative: 7 % (ref 3–12)
Neutro Abs: 8.8 10*3/uL — ABNORMAL HIGH (ref 1.7–7.7)
Neutrophils Relative %: 75 % (ref 43–77)

## 2010-09-11 LAB — RAPID URINE DRUG SCREEN, HOSP PERFORMED
Amphetamines: NOT DETECTED
Benzodiazepines: NOT DETECTED
Opiates: NOT DETECTED

## 2010-09-11 NOTE — Discharge Summary (Signed)
Blake Arnold, SCHUELLER              ACCOUNT NO.:  [id number]  MEDICAL RECORD NO.:  [id number]  LOCATION:  3036                         FACILITY:  [facility]  PHYSICIAN:  Leighton Roach Maureena Dabbs, M.D.DATE OF BIRTH:  October 20, 1970  DATE OF ADMISSION:  08/21/2010 DATE OF DISCHARGE:  08/24/2010                              DISCHARGE SUMMARY   DISCHARGE DIAGNOSES: 1. Left-sided weakness. 2. Alcohol drug abuse. 3. History of atrial fibrillation.  DISCHARGE MEDICATIONS:  We recommended aspirin - 81 mg daily, but the patient left against medical advice.  CONSULTS:  Neurology with Dr. Terrace Arabia, we ordered MRI, MRA, carotid ultrasound, and echo.  LABORATORY DATA:  Hemoglobin 17.7 after hydration 15.9.  Urine drug screen negative.  INR 0.98.  Sodium 142, potassium 3.7.  PERTINENT STUDIES: 1. Chest x-ray with no acute cardiopulmonary abnormality. 2. CT of the head without contrast, stable.  Normal contrast CT     appearance of the brain. 3. MRI no acute intracranial abnormality. Age nonspecific     white matter signal changes without evidence of new or previous     cortical infarction.  4. MRA, negative intracranial MRA except for     tortuosity of the vertebral or vascular function. 5. Echo with mild left ventricular hypertrophy, systolic function was     normal with an ejection fraction of 55%-60%, wall motion was normal     and no cardiac source of emboli. 6. Carotid Dopplers with no significant intracranial carotid artery     stenosis demonstrated.  Vertebrals are patent with antegrade flow.  BRIEF HOSPITAL COURSE:  This is a 40 year old male with a history of alcohol and drug abuse that came with weakness of his left upper and lower extremities. 1. Left-sided weakness.  The patient was positive on physical exam of     4/5 strength on left arm and leg and overall sensation decreased.     There was no pronator drift and coordination was intact.  The     patient was on stroke protocol, all  procedures were negative for     ischemia or bleeding.  The patient wanted to go home and left AMA. 2. Alcohol/drug addiction.  The patient drinks on daily basis until he     "passes out" and does use cocaine and pills.  He wanted to get     inpatient rehabilitation.  This was starting to be set up     by social work but the patient left. 3. The patient reports history of AFib in the past with no treatment     or followup.  EKG positive for ectopic atrial rhythm.       The patient was on aspirin while hospitalized, not     a good candidate for anticoagulation due to his alcoholism and     increased risk of falls/hemorrhage.  DISCHARGE INSTRUCTIONS:  There were no discharge instructions as the patient left AMA.    ______________________________ Wayne Both, MD   ______________________________ Etta Grandchild, M.D.    DP/MEDQ  D:  09/01/2010  T:  09/01/2010  Job:  [id number]  Electronically Signed by Lillia Abed DE LA PAZ  on 09/02/2010 01:29:31 AM Electronically Signed by  Zamirah Denny M.D. on 09/11/2010 01:50:08 PM

## 2010-09-18 NOTE — H&P (Signed)
NAMEFODAY, CONE NO.:  [id number]  MEDICAL RECORD NO.:  [id number]  LOCATION:  3036                         FACILITY:  [facility]  PHYSICIAN:  Santiago Bumpers. Hensel, M.D.DATE OF BIRTH:  14-Nov-1970  DATE OF ADMISSION:  08/21/2010 DATE OF DISCHARGE:                             HISTORY & PHYSICAL   PCP:  The patient is unassigned.  ZO:[id number] weakness.  HISTORY OF PRESENT ILLNESS:  A 40 year old male with the history of being a heavy drinker and AFib in the past with poor f/u. After an episode of binge drinking yesterday, he noticed some weakness on his left side accompanied with some chest pain.  His brother gave him clonidine and he went to sleep.  In the morning today after another drink, he felt worse about his left side weakness now including his arm and leg, this is associated with dizziness no nausea or vomitting. No chest pain today, no shortness of breath, no fever or chills,  PAST MEDICAL HISTORY:  AFib that was cardioverted by the patient comments.  He mentioned he had used aspirin but is not compliant.  PAST SURGICAL HISTORY:  None.  SOCIAL HISTORY:  The patient lives with his mother.  He does not have any occupation.  He smokes tobacco 1 pack a day for 25 years.  He drinks alcohol, liquor at first now beer per patient until he passes out on a daily basis.  DRUGS:  The patient takes cocaine crack and last time that he took that was 6 days ago.  Also, the patient abuses Percocet, Lyrica, and Xanax.  FAMILY HISTORY: 1. He lives with his mother.  She has history of heart attack in the past. 2. His father lives and he is healthy.  REVIEW OF SYSTEMS: Please refer to HPI  ALLERGIES:  NKDA.  PHYSICAL EXAMINATION:  VITAL SIGNS:  Pulse 98, respirations 18, blood pressure 120/90. GENERAL:  NAD. CARDIOVASCULAR:  RRR.  No murmurs. LUNGS:  Breath sounds normal bilaterally.  No rales. ABDOMEN:  Soft, nontender, nondistended. EXTREMITIES:  No  edema. NEURO:  Oriented x3, 4/5 strength on left upper and lower extremities. Coordination:  Finger-to-nose and finger-to-finger normal. Rapid alternate movements normal.We could no deambulate the patient due to excesive dizziness.  LABORATORY DATA AND STUDIES:  Sodium 142, potassium 3.7, chloride 106, bicarb 23, BUN 3, creatinine 0.90, glucose 84. Hemoglobin 17.7, white count 8.7, and platelets 228.  INR 0.88 and PT 27 seconds normal.  Pertinent studies:  CT of the head that showed stable and normal. Noncontrast CT appearance of the brain.  ASSESSMENT AND PLAN:  This is a 40 year old male with a history of alcohol abuse that is admitted after having left-sided weakness. 1. Stroke.  Positive neurologic findings for more than 24 hours.  No     meningeal findings and CT negative.  We will start stroke protocol. 2. History of Atrial fibrillation.  Not compliant, no f/u.     We will order EKG and stratified him accordingly with Chads2     score. 3. Alcohol and drug abuser.  The patient wants to go to inpatient     rehab.  We will involve social worker to help [place] in this  matter and     he was started on CIWA protocol. 4. Chest pain negative today, but as part of stroke protocol, we will     run cardiac enzymes x3. 5. Fluids, electrolytes, nutrition.  Heart-healthy diet. 6. Prophylaxis. Lovenox. 7. Disposition.  Pending and patient improvement.    ______________________________ Wayne Both, MD   ______________________________ Santiago Bumpers Leveda Anna, M.D.    DP/MEDQ  D:  08/21/2010  T:  08/22/2010  Job:  [id number]  Electronically Signed by Delorse Lek PAZ  on 09/11/2010 09:40:44 PM Electronically Signed by Doralee Albino M.D. on 09/18/2010 10:04:31 AM

## 2010-09-24 ENCOUNTER — Emergency Department (HOSPITAL_BASED_OUTPATIENT_CLINIC_OR_DEPARTMENT_OTHER)
Admission: EM | Admit: 2010-09-24 | Discharge: 2010-09-25 | Disposition: A | Discharge status: Home / Self Care | Payer: Self-pay | Attending: Emergency Medicine | Admitting: Emergency Medicine

## 2010-09-24 DIAGNOSIS — F17200 Nicotine dependence, unspecified, uncomplicated: Secondary | ICD-10-CM | POA: Insufficient documentation

## 2010-09-24 DIAGNOSIS — F1010 Alcohol abuse, uncomplicated: Secondary | ICD-10-CM

## 2010-09-24 HISTORY — DX: Unspecified atrial fibrillation: I48.91

## 2010-09-24 HISTORY — DX: Dorsalgia, unspecified: M54.9

## 2010-09-24 HISTORY — DX: Other chronic pain: G89.29

## 2010-09-24 HISTORY — DX: Unspecified viral hepatitis C without hepatic coma: B19.20

## 2010-09-24 LAB — CBC
HCT: 48.2 % (ref 39.0–52.0)
MCV: 92.7 fL (ref 78.0–100.0)
Platelets: 185 10*3/uL (ref 150–400)
RBC: 5.2 MIL/uL (ref 4.22–5.81)
WBC: 8.7 10*3/uL (ref 4.0–10.5)

## 2010-09-24 LAB — COMPREHENSIVE METABOLIC PANEL
ALT: 32 U/L (ref 0–53)
AST: 34 U/L (ref 0–37)
Alkaline Phosphatase: 65 U/L (ref 39–117)
CO2: 23 mEq/L (ref 19–32)
Chloride: 98 mEq/L (ref 96–112)
Creatinine, Ser: 0.6 mg/dL (ref 0.50–1.35)
GFR calc non Af Amer: >60 mL/min (ref >60)
Potassium: 4 mEq/L (ref 3.5–5.1)
Total Bilirubin: 0.5 mg/dL (ref 0.3–1.2)

## 2010-09-24 LAB — URINALYSIS, ROUTINE W REFLEX MICROSCOPIC
Bilirubin Urine: NEGATIVE
Glucose, UA: NEGATIVE mg/dL
Hgb urine dipstick: NEGATIVE
Ketones, ur: NEGATIVE mg/dL
Protein, ur: NEGATIVE mg/dL
pH: 6 (ref 5.0–8.0)

## 2010-09-24 LAB — RAPID URINE DRUG SCREEN, HOSP PERFORMED
Amphetamines: NOT DETECTED
Barbiturates: NOT DETECTED
Benzodiazepines: NOT DETECTED
Tetrahydrocannabinol: NOT DETECTED

## 2010-09-24 LAB — ETHANOL: Alcohol, Ethyl (B): 103 mg/dL — ABNORMAL HIGH (ref 0–11)

## 2010-09-24 MED ORDER — LORAZEPAM 1 MG PO TABS
1.0000 mg | ORAL_TABLET | Freq: Once | ORAL | Status: AC
  Administered 2010-09-24: 1 mg via ORAL
  Filled 2010-09-24: qty 1

## 2010-09-24 NOTE — ED Notes (Signed)
Pt requested assist with ETOH abuse-last drank case of beer this am

## 2010-09-24 NOTE — ED Notes (Signed)
Pt resting quietly.

## 2010-09-24 NOTE — ED Notes (Signed)
[facility] working on admit to in pt facility, pt cont to rest w/o complaint

## 2010-09-24 NOTE — ED Provider Notes (Signed)
History     CSN: [id number] Arrival date & time: 09/24/2010  3:11 PM  Chief Complaint  Patient presents with  . Alcohol Intoxication   Patient is a 40 y.o. male presenting with intoxication. The history is provided by the patient.  Alcohol Intoxication This is a new problem. The current episode started more than 1 month ago. The problem occurs constantly. The problem has been unchanged. The symptoms are aggravated by drinking. He has tried nothing for the symptoms. The treatment provided no relief.  Alcohol Intoxication This is a new problem. The current episode started more than 1 month ago. The problem occurs constantly. The problem has been unchanged. The symptoms are aggravated by drinking. He has tried nothing for the symptoms. The treatment provided no relief.  Pt request admission to an alcohol detox facility.  Pt was at [facility]  A month ago.  Pt reports he wants to go back.    [facility] here to see pt and [facility] has agreed to accept   Past Medical History  Diagnosis Date  . Hepatitis C   . Chronic back pain   . A-fib     History reviewed. No pertinent past surgical history.  No family history on file.  History  Substance Use Topics  . Smoking status: Current Everyday Smoker  . Smokeless tobacco: Not on file  . Alcohol Use: 14.4 oz/week    24 Cans of beer per week      Review of Systems  Psychiatric/Behavioral: Negative for suicidal ideas, hallucinations and self-injury.  All other systems reviewed and are negative.    Physical Exam  BP 144/94  Pulse 92  Temp(Src) 98 F (36.7 C) (Oral)  Resp 16  Ht 5\' 8"  (1.727 m)  Wt 175 lb (79.379 kg)  BMI 26.61 kg/m2  SpO2 97%  Physical Exam  Nursing note and vitals reviewed. Constitutional: He is oriented to person, place, and time. He appears well-developed and well-nourished.  HENT:  Head: Normocephalic and atraumatic.  Eyes: Conjunctivae and EOM are normal. Pupils are equal, round, and reactive to  light.  Neck: Normal range of motion. Neck supple.  Cardiovascular: Normal rate.   Pulmonary/Chest: Effort normal.  Abdominal: Soft.  Musculoskeletal: Normal range of motion.  Neurological: He is alert and oriented to person, place, and time. He has normal reflexes.  Skin: Skin is warm and dry.  Psychiatric: He has a normal mood and affect.    ED Course  Procedures  Pt to go to [facility] for alcohol treatment.     Results for orders placed during the hospital encounter of 09/24/10  CBC      Component Value Range   WBC 8.7  4.0 - 10.5 (K/uL)   RBC 5.20  4.22 - 5.81 (MIL/uL)   Hemoglobin 17.3 (*) 13.0 - 17.0 (g/dL)   HCT 62.1  30.8 - 65.7 (%)   MCV 92.7  78.0 - 100.0 (fL)   MCH 33.3  26.0 - 34.0 (pg)   MCHC 35.9  30.0 - 36.0 (g/dL)   RDW 84.6  96.2 - 95.2 (%)   Platelets 185  150 - 400 (K/uL)  COMPREHENSIVE METABOLIC PANEL      Component Value Range   Sodium 136  135 - 145 (mEq/L)   Potassium 4.0  3.5 - 5.1 (mEq/L)   Chloride 98  96 - 112 (mEq/L)   CO2 23  19 - 32 (mEq/L)   Glucose, Bld 75  70 - 99 (mg/dL)   BUN 5 (*)  6 - 23 (mg/dL)   Creatinine, Ser 9.14  0.50 - 1.35 (mg/dL)   Calcium 9.6  8.4 - 78.2 (mg/dL)   Total Protein 8.5 (*) 6.0 - 8.3 (g/dL)   Albumin 4.2  3.5 - 5.2 (g/dL)   AST 34  0 - 37 (U/L)   ALT 32  0 - 53 (U/L)   Alkaline Phosphatase 65  39 - 117 (U/L)   Total Bilirubin 0.5  0.3 - 1.2 (mg/dL)   GFR calc non Af Amer >60  >60 (mL/min)   GFR calc Af Amer >60  >60 (mL/min)  ETHANOL      Component Value Range   Alcohol, Ethyl (B) 103 (*) 0 - 11 (mg/dL)  URINALYSIS, ROUTINE W REFLEX MICROSCOPIC      Component Value Range   Color, Urine YELLOW  YELLOW    Appearance CLEAR  CLEAR    Specific Gravity, Urine 1.004 (*) 1.005 - 1.030    pH 6.0  5.0 - 8.0    Glucose, UA NEGATIVE  NEGATIVE (mg/dL)   Hgb urine dipstick NEGATIVE  NEGATIVE    Bilirubin Urine NEGATIVE  NEGATIVE    Ketones, ur NEGATIVE  NEGATIVE (mg/dL)   Protein, ur NEGATIVE  NEGATIVE (mg/dL)    Urobilinogen, UA 0.2  0.0 - 1.0 (mg/dL)   Nitrite NEGATIVE  NEGATIVE    Leukocytes, UA NEGATIVE  NEGATIVE   URINE RAPID DRUG SCREEN (HOSP PERFORMED)      Component Value Range   Opiates NONE DETECTED  NONE DETECTED    Cocaine NONE DETECTED  NONE DETECTED    Benzodiazepines NONE DETECTED  NONE DETECTED    Amphetamines NONE DETECTED  NONE DETECTED    Tetrahydrocannabinol NONE DETECTED  NONE DETECTED    Barbiturates NONE DETECTED  NONE DETECTED    Dg Chest 2 View  09/11/2010  *RADIOLOGY REPORT*  Clinical Data: Chest pain and shortness of breath.  Smoker.  CHEST - 2 VIEW  Comparison: 08/21/2010  Findings: The heart size and pulmonary vascularity are normal. The lungs appear clear and expanded without focal air space disease or consolidation. No blunting of the costophrenic angles.  No significant change since previous study.  IMPRESSION: No evidence of active pulmonary disease.  Original Report Authenticated By: Marlon Pel, M.D.    9:22 AM Pt's case reviewed.  He has a long history of alcohol and substance abuse.  He also has had a recent stroke and has a history of atrial fibrillations.  At present he is resting peacefully, waiting for placement in an alcohol treatment program. Carleene Cooper III, M.D.   3:57 PM Pt being considered for admission at a facility in [facility]  He is medically cleared for psychiatric treatment. Osvaldo Human, M.D.    Medical screening examination/treatment/procedure(s) were performed by non-physician practitioner and as supervising physician I was immediately available for consultation/collaboration.  I participated in arranging his transfer to a detox facility.  Osvaldo Human, M.D.   [place], [place] 09/24/10 1740  [place], [place] 09/24/10 1805  Carleene Cooper III, MD 09/25/10 [phone]

## 2010-09-25 MED ORDER — LORAZEPAM 1 MG PO TABS
1.0000 mg | ORAL_TABLET | Freq: Once | ORAL | Status: AC
  Administered 2010-09-25: 1 mg via ORAL

## 2010-09-25 MED ORDER — LORAZEPAM 1 MG PO TABS
ORAL_TABLET | ORAL | Status: AC
  Administered 2010-09-25: 1 mg via ORAL
  Filled 2010-09-25: qty 1

## 2010-09-25 MED ORDER — LORAZEPAM 1 MG PO TABS
1.0000 mg | ORAL_TABLET | Freq: Once | ORAL | Status: AC
  Administered 2010-09-25 (×2): 1 mg via ORAL

## 2010-09-25 NOTE — ED Notes (Signed)
[facility] called and spoke with charge nurse Lawson Fiscal. Attempting to find placement for pt.

## 2010-09-25 NOTE — ED Notes (Signed)
Jamie with [facility] in department.

## 2010-09-25 NOTE — ED Provider Notes (Signed)
History     CSN: [id number] Arrival date & time: 09/24/2010  3:11 PM  Chief Complaint  Patient presents with  . Alcohol Intoxication   HPI  Past Medical History  Diagnosis Date  . Hepatitis C   . Chronic back pain   . A-fib     History reviewed. No pertinent past surgical history.  No family history on file.  History  Substance Use Topics  . Smoking status: Current Everyday Smoker  . Smokeless tobacco: Not on file  . Alcohol Use: 14.4 oz/week    24 Cans of beer per week      Review of Systems  Physical Exam  BP 132/86  Pulse 91  Temp(Src) 98 F (36.7 C) (Oral)  Resp 16  Ht 5\' 8"  (1.727 m)  Wt 175 lb (79.379 kg)  BMI 26.61 kg/m2  SpO2 100%  Physical Exam  ED Course  Procedures  MDM Medical screening examination/treatment/procedure(s) were performed by non-physician practitioner and as supervising physician I was immediately available for consultation/collaboration. Awaiting placement by [facility] for etoh detox. No SI/HI     Forbes Cellar, MD 09/25/10 0010

## 2010-09-25 NOTE — ED Notes (Signed)
Pt given cereal and milk per pt request.

## 2010-09-25 NOTE — ED Notes (Signed)
Pt continues to wait for inpt placement for detox. No complaints noted or voiced.

## 2010-09-25 NOTE — ED Notes (Signed)
Per Misty Stanley from [facility] they will provide transport for pt- report called to Rosanna Randy, RN

## 2010-09-25 NOTE — ED Notes (Signed)
Janie from [employer] called and sts she will be here to meet with pt about 10:30 today.

## 2010-09-25 NOTE — ED Notes (Signed)
Call received from Bishop with [facility] requesting additional info from patient on past history of stroke. Pt reports that he was diagnosed with a TIA a couple of months ago - pt denies any deficits or current symptoms- Asher Muir updated on pt's report-

## 2010-09-25 NOTE — ED Notes (Signed)
Call placed to [facility] in regards to pt's wait for an inpt bed. Spoke to Peebles who states that he is waiting on a return call from [facility] and will let us know plan of care. Pt made aware.

## 2010-09-25 NOTE — ED Notes (Signed)
paper

## 2010-09-25 NOTE — ED Notes (Signed)
Spoke with Misty Stanley from [facility]- pt has been accepted at [facility] but does not have transportation at this time- they will call back and advise when pt has transportation

## 2010-09-25 NOTE — ED Notes (Signed)
Update that pt is medically cleared per EDP Davidson faxed to [facility] [phone]- fax confirmation received

## 2010-09-25 NOTE — ED Notes (Signed)
rec'd call from [facility], labs faxed per request for possible admission.

## 2010-09-25 NOTE — ED Notes (Signed)
Patient is resting comfortably. Pt informed [facility] will be here. No needs at this time.

## 2010-09-25 NOTE — ED Notes (Signed)
Spoke with Misty Stanley from [facility]- states pt is accepted at [facility]- contact # for pt's family given to Misty Stanley to arrange transport- Mother's cell # [phone] or [phone]

## 2010-09-25 NOTE — ED Notes (Signed)
Pt sts he has been "in and out of detox several times for the past 2 months". Pt sts he was at [facility] and left after 3 days. Pt sts his drinking has "gotten worse" and wants to stop drinking. Pt sts he is a pack a day smoker and occasional cocaine user. Pt behavior is appropriate and cooperative. Pt denies SI/HI.

## 2010-09-25 NOTE — ED Notes (Signed)
Blake Arnold, [facility] here to transport pt to [facility]

## 2010-09-25 NOTE — ED Notes (Signed)
Pt requested/given ativan

## 2010-11-10 ENCOUNTER — Emergency Department (HOSPITAL_BASED_OUTPATIENT_CLINIC_OR_DEPARTMENT_OTHER)
Admission: EM | Admit: 2010-11-10 | Discharge: 2010-11-11 | Disposition: A | Discharge status: Other Acute Inpatient Hospital | Payer: Self-pay | Attending: Emergency Medicine | Admitting: Emergency Medicine

## 2010-11-10 ENCOUNTER — Encounter (HOSPITAL_BASED_OUTPATIENT_CLINIC_OR_DEPARTMENT_OTHER): Payer: Self-pay | Admitting: *Deleted

## 2010-11-10 ENCOUNTER — Other Ambulatory Visit: Payer: Self-pay

## 2010-11-10 DIAGNOSIS — F1010 Alcohol abuse, uncomplicated: Secondary | ICD-10-CM | POA: Insufficient documentation

## 2010-11-10 DIAGNOSIS — F17200 Nicotine dependence, unspecified, uncomplicated: Secondary | ICD-10-CM | POA: Insufficient documentation

## 2010-11-10 DIAGNOSIS — G8929 Other chronic pain: Secondary | ICD-10-CM | POA: Insufficient documentation

## 2010-11-10 LAB — DIFFERENTIAL
Eosinophils Relative: 2 % (ref 0–5)
Lymphocytes Relative: 30 % (ref 12–46)
Lymphs Abs: 2.3 10*3/uL (ref 0.7–4.0)
Monocytes Absolute: 0.6 10*3/uL (ref 0.1–1.0)

## 2010-11-10 LAB — CBC
HCT: 47.7 % (ref 39.0–52.0)
MCH: 32.4 pg (ref 26.0–34.0)
MCV: 91.9 fL (ref 78.0–100.0)
RBC: 5.19 MIL/uL (ref 4.22–5.81)
RDW: 13 % (ref 11.5–15.5)
WBC: 7.6 10*3/uL (ref 4.0–10.5)

## 2010-11-10 LAB — COMPREHENSIVE METABOLIC PANEL
BUN: 7 mg/dL (ref 6–23)
CO2: 23 mEq/L (ref 19–32)
Calcium: 9 mg/dL (ref 8.4–10.5)
Creatinine, Ser: 0.5 mg/dL (ref 0.50–1.35)
GFR calc Af Amer: >60 mL/min (ref >60)
GFR calc non Af Amer: >60 mL/min (ref >60)
Glucose, Bld: 85 mg/dL (ref 70–99)

## 2010-11-10 LAB — RAPID URINE DRUG SCREEN, HOSP PERFORMED
Benzodiazepines: NOT DETECTED
Cocaine: NOT DETECTED
Opiates: NOT DETECTED

## 2010-11-10 NOTE — ED Notes (Signed)
Patient states he does not know who dropped him off her at the hospital and has been drinking liquor for some time, poor historian, VSS, pleasant, wants to sleep, no c/o of pain at this time

## 2010-11-10 NOTE — ED Provider Notes (Signed)
History     CSN: [id number] Arrival date & time: 11/10/2010  5:35 PM  Chief Complaint  Patient presents with  . Alcohol Intoxication    (Consider location/radiation/quality/duration/timing/severity/associated sxs/prior treatment) Patient is a 40 y.o. male presenting with intoxication.  Alcohol Intoxication This is a recurrent problem. The current episode started today. The problem occurs constantly. The problem has been gradually worsening. Pertinent negatives include no abdominal pain or fever. The symptoms are aggravated by drinking. He has tried nothing for the symptoms. The treatment provided moderate relief.  Pt has been in a treatment program recently.  Pt is here for medical clearance.  (Pt states he doesn't know where he is or why he is here)   Past Medical History  Diagnosis Date  . Hepatitis C   . Chronic back pain   . A-fib     History reviewed. No pertinent past surgical history.  No family history on file.  History  Substance Use Topics  . Smoking status: Current Everyday Smoker  . Smokeless tobacco: Not on file  . Alcohol Use: 14.4 oz/week    24 Cans of beer per week      Review of Systems  Constitutional: Negative for fever.  Gastrointestinal: Negative for abdominal pain.  All other systems reviewed and are negative.    Allergies  Codeine and Hydrocodone  Home Medications   Current Outpatient Rx  Name Route Sig Dispense Refill  . ASPIRIN 81 MG PO TABS Oral Take 81 mg by mouth daily.      [name] FOLIC ACID 1 MG PO TABS Oral Take 1 mg by mouth daily.      . THIAMINE HCL 100 MG PO TABS Oral Take 100 mg by mouth daily.        BP 117/81  Pulse 86  Temp(Src) 97.5 F (36.4 C) (Oral)  Resp 17  SpO2 98%  Physical Exam  Nursing note and vitals reviewed. Constitutional: He appears well-developed and well-nourished.  HENT:  Head: Normocephalic and atraumatic.  Eyes: Conjunctivae and EOM are normal. Pupils are equal, round, and reactive to light.    Neck: Normal range of motion. Neck supple.  Cardiovascular: Normal rate.   Abdominal: Soft.  Musculoskeletal: Normal range of motion.  Neurological: He is alert.  Skin: Skin is warm and dry.  Psychiatric:       intoxicated    ED Course  Procedures (including critical care time)   Labs Reviewed  CBC  DIFFERENTIAL  ETHANOL  COMPREHENSIVE METABOLIC PANEL  URINE RAPID DRUG SCREEN (HOSP PERFORMED)   No results found.   No diagnosis found.    MDM  Pt's alcohol is 419.  Pt will be observed until decreased alcohol level.  [facility] assessed pt at home and here.  They will return in am to try to place pt.   Date: 11/10/2010  Rate: 79  Rhythm: normal sinus rhythm  QRS Axis: normal  Intervals: normal  ST/T Wave abnormalities: normal  Conduction Disutrbances:none  Narrative Interpretation:   Old EKG Reviewed: unchanged       Langston Masker, [place] 11/10/10 2215  Langston Masker, [place] 11/15/10 1458

## 2010-11-11 ENCOUNTER — Encounter (HOSPITAL_BASED_OUTPATIENT_CLINIC_OR_DEPARTMENT_OTHER): Payer: Self-pay

## 2010-11-11 ENCOUNTER — Inpatient Hospital Stay (HOSPITAL_COMMUNITY)
Admission: RE | Admit: 2010-11-11 | Discharge: 2010-11-13 | DRG: 897 | Disposition: A | Discharge status: Home / Self Care | Payer: PRIVATE HEALTH INSURANCE | Source: Ambulatory Visit | Attending: Psychiatry | Admitting: Psychiatry

## 2010-11-11 DIAGNOSIS — I4891 Unspecified atrial fibrillation: Secondary | ICD-10-CM

## 2010-11-11 DIAGNOSIS — F1210 Cannabis abuse, uncomplicated: Secondary | ICD-10-CM

## 2010-11-11 DIAGNOSIS — G8929 Other chronic pain: Secondary | ICD-10-CM

## 2010-11-11 DIAGNOSIS — F1110 Opioid abuse, uncomplicated: Secondary | ICD-10-CM

## 2010-11-11 DIAGNOSIS — M549 Dorsalgia, unspecified: Secondary | ICD-10-CM

## 2010-11-11 DIAGNOSIS — Z6379 Other stressful life events affecting family and household: Secondary | ICD-10-CM

## 2010-11-11 DIAGNOSIS — F329 Major depressive disorder, single episode, unspecified: Secondary | ICD-10-CM

## 2010-11-11 DIAGNOSIS — F3289 Other specified depressive episodes: Secondary | ICD-10-CM

## 2010-11-11 DIAGNOSIS — R45851 Suicidal ideations: Secondary | ICD-10-CM

## 2010-11-11 DIAGNOSIS — F1020 Alcohol dependence, uncomplicated: Principal | ICD-10-CM

## 2010-11-11 DIAGNOSIS — Z818 Family history of other mental and behavioral disorders: Secondary | ICD-10-CM

## 2010-11-11 DIAGNOSIS — B1920 Unspecified viral hepatitis C without hepatic coma: Secondary | ICD-10-CM

## 2010-11-11 DIAGNOSIS — F411 Generalized anxiety disorder: Secondary | ICD-10-CM

## 2010-11-11 LAB — ETHANOL: Alcohol, Ethyl (B): 93 mg/dL — ABNORMAL HIGH (ref 0–11)

## 2010-11-11 MED ORDER — NICOTINE 21 MG/24HR TD PT24
21.0000 mg | MEDICATED_PATCH | Freq: Every day | TRANSDERMAL | Status: DC
  Filled 2010-11-11: qty 1

## 2010-11-11 MED ORDER — LORAZEPAM 1 MG PO TABS
2.0000 mg | ORAL_TABLET | ORAL | Status: DC | PRN
  Administered 2010-11-11 (×2): 2 mg via ORAL
  Filled 2010-11-11 (×2): qty 2

## 2010-11-11 MED ORDER — NICOTINE 14 MG/24HR TD PT24
MEDICATED_PATCH | TRANSDERMAL | Status: AC
  Administered 2010-11-11: 10:00:00 via TRANSDERMAL
  Filled 2010-11-11: qty 1

## 2010-11-11 MED ORDER — IBUPROFEN 400 MG PO TABS
600.0000 mg | ORAL_TABLET | Freq: Three times a day (TID) | ORAL | Status: DC | PRN
  Administered 2010-11-11: 600 mg via ORAL
  Filled 2010-11-11: qty 1

## 2010-11-11 MED ORDER — ONDANSETRON HCL 8 MG PO TABS: 4.0000 mg | ORAL_TABLET | Freq: Three times a day (TID) | ORAL | Status: DC | PRN

## 2010-11-11 MED ORDER — ONDANSETRON 4 MG PO TBDP
ORAL_TABLET | ORAL | Status: AC
  Administered 2010-11-11: 10:00:00
  Filled 2010-11-11: qty 1

## 2010-11-11 NOTE — ED Notes (Signed)
Called mobile crisis and requested callback regarding placement of patient

## 2010-11-11 NOTE — ED Provider Notes (Addendum)
Care the patient was taken over at approximately midnight.  The patient is resting comfortably in his bed through the night.  When spoke to the patient at this time he currently reports feeling good has no complaints.  He does report he doesn't have any recollection of coming to the emergency department last time.  He does report suicidal thoughts about suicide attempts.  He reports long-term use of EtOH.  He continues to express a desire for alcohol detox.  The mobile crisis team will return to the emergency department this morning to help with disposition.  Repeat alcohol level obtained at this time.  Care transfer to oncoming physician to be provided at 7 AM [id number].   Results for orders placed during the hospital encounter of 11/10/10  ETHANOL      Component Value Range   Alcohol, Ethyl (B) 419 (*) 0 - 11 (mg/dL)  CBC      Component Value Range   WBC 7.6  4.0 - 10.5 (K/uL)   RBC 5.19  4.22 - 5.81 (MIL/uL)   Hemoglobin 16.8  13.0 - 17.0 (g/dL)   HCT 16.1  09.6 - 04.5 (%)   MCV 91.9  78.0 - 100.0 (fL)   MCH 32.4  26.0 - 34.0 (pg)   MCHC 35.2  30.0 - 36.0 (g/dL)   RDW 40.9  81.1 - 91.4 (%)   Platelets 152  150 - 400 (K/uL)  DIFFERENTIAL      Component Value Range   Neutrophils Relative 60  43 - 77 (%)   Neutro Abs 4.5  1.7 - 7.7 (K/uL)   Lymphocytes Relative 30  12 - 46 (%)   Lymphs Abs 2.3  0.7 - 4.0 (K/uL)   Monocytes Relative 8  3 - 12 (%)   Monocytes Absolute 0.6  0.1 - 1.0 (K/uL)   Eosinophils Relative 2  0 - 5 (%)   Eosinophils Absolute 0.2  0.0 - 0.7 (K/uL)   Basophils Relative 0  0 - 1 (%)   Basophils Absolute 0.0  0.0 - 0.1 (K/uL)  COMPREHENSIVE METABOLIC PANEL      Component Value Range   Sodium 141  135 - 145 (mEq/L)   Potassium 3.7  3.5 - 5.1 (mEq/L)   Chloride 104  96 - 112 (mEq/L)   CO2 23  19 - 32 (mEq/L)   Glucose, Bld 85  70 - 99 (mg/dL)   BUN 7  6 - 23 (mg/dL)   Creatinine, Ser 7.82  0.50 - 1.35 (mg/dL)   Calcium 9.0  8.4 - 95.6 (mg/dL)   Total Protein 8.3   6.0 - 8.3 (g/dL)   Albumin 4.0  3.5 - 5.2 (g/dL)   AST 48 (*) 0 - 37 (U/L)   ALT 49  0 - 53 (U/L)   Alkaline Phosphatase 67  39 - 117 (U/L)   Total Bilirubin 0.3  0.3 - 1.2 (mg/dL)   GFR calc non Af Amer >60  >60 (mL/min)   GFR calc Af Amer >60  >60 (mL/min)  URINE RAPID DRUG SCREEN (HOSP PERFORMED)      Component Value Range   Opiates NONE DETECTED  NONE DETECTED    Cocaine NONE DETECTED  NONE DETECTED    Benzodiazepines NONE DETECTED  NONE DETECTED    Amphetamines NONE DETECTED  NONE DETECTED    Tetrahydrocannabinol NONE DETECTED  NONE DETECTED    Barbiturates NONE DETECTED  NONE DETECTED      Lyanne Co, MD 11/11/10 [phone]  Supervising physician  for Ms. Trisha Mangle was Dr. Mikle Bosworth, MD 11/11/10 [phone]

## 2010-11-11 NOTE — ED Provider Notes (Signed)
Patient sleeping but when awoken he has baseline tremor He reports long history of alcohol abuse We'll start Ativan while waiting placement He did admit to suicidality to previous physician, but not currently suicidal Waiting placement by mobile crisis Patient stable at this time  Joya Gaskins, MD 11/11/10 [phone]

## 2010-11-11 NOTE — ED Provider Notes (Signed)
History     CSN: [id number] Arrival date & time: 11/10/2010  5:35 PM  Chief Complaint  Patient presents with  . Alcohol Intoxication    (Consider location/radiation/quality/duration/timing/severity/associated sxs/prior treatment) HPI  Past Medical History  Diagnosis Date  . Hepatitis C   . Chronic back pain   . A-fib     History reviewed. No pertinent past surgical history.  History reviewed. No pertinent family history.  History  Substance Use Topics  . Smoking status: Current Everyday Smoker  . Smokeless tobacco: Not on file  . Alcohol Use: 14.4 oz/week    24 Cans of beer per week      Review of Systems  Allergies  Codeine and Hydrocodone  Home Medications  No current outpatient prescriptions on file.  BP 139/86  Pulse 89  Temp(Src) 98.3 F (36.8 C) (Oral)  Resp 16  SpO2 98%  Physical Exam  ED Course  Procedures (including critical care time)  Labs Reviewed  ETHANOL - Abnormal; Notable for the following:    Alcohol, Ethyl (B) 419 (*)    All other components within normal limits  COMPREHENSIVE METABOLIC PANEL - Abnormal; Notable for the following:    AST 48 (*)    All other components within normal limits  ETHANOL - Abnormal; Notable for the following:    Alcohol, Ethyl (B) 93 (*)    All other components within normal limits  CBC  DIFFERENTIAL  URINE RAPID DRUG SCREEN (HOSP PERFORMED)   No results found.   1. Alcohol abuse       MDM  Medical screening examination/treatment/procedure(s) were performed by non-physician practitioner and as supervising physician I was immediately available for consultation/collaboration.         Nat Christen, MD 11/11/10 [phone]

## 2010-11-11 NOTE — ED Notes (Signed)
Gave patient something to drink 

## 2010-11-11 NOTE — ED Notes (Signed)
Patient states that his mother is coming to pick him up, no thoughts or plan to hurt himself at this time, nor weapons in the house, given ativan prior to discharge

## 2010-11-12 DIAGNOSIS — F1020 Alcohol dependence, uncomplicated: Secondary | ICD-10-CM

## 2010-11-12 DIAGNOSIS — F1920 Other psychoactive substance dependence, uncomplicated: Secondary | ICD-10-CM

## 2010-11-13 LAB — COMPREHENSIVE METABOLIC PANEL
ALT: 35
AST: 37 U/L (ref 0–37)
AST: 22
BUN: 7 mg/dL (ref 6–23)
CO2: 29 mEq/L (ref 19–32)
CO2: 26
Calcium: 9.2
Chloride: 103 mEq/L (ref 96–112)
Creatinine, Ser: 0.75 mg/dL (ref 0.50–1.35)
GFR calc Af Amer: >60
GFR calc non Af Amer: >90 mL/min (ref >90)
GFR calc non Af Amer: >60
Glucose, Bld: 101 mg/dL — ABNORMAL HIGH (ref 70–99)
Sodium: 139
Total Bilirubin: 1 mg/dL (ref 0.3–1.2)
Total Protein: 7.2

## 2010-11-13 LAB — URINALYSIS, ROUTINE W REFLEX MICROSCOPIC
Bilirubin Urine: NEGATIVE
Glucose, UA: NEGATIVE
Hgb urine dipstick: NEGATIVE
Ketones, ur: NEGATIVE
pH: 5.5

## 2010-11-13 LAB — DIFFERENTIAL
Eosinophils Absolute: 0.2
Eosinophils Relative: 1
Lymphs Abs: 1.6
Monocytes Absolute: 0.4
Monocytes Relative: 4

## 2010-11-13 LAB — CBC
MCHC: 34.1
RBC: 5.04

## 2010-11-21 NOTE — Assessment & Plan Note (Signed)
Blake Arnold, Blake Arnold NO.:  [id number]  MEDICAL RECORD NO.:  [id number]  LOCATION:  0302                          FACILITY:  [facility]  PHYSICIAN:  Orson Aloe, MD       DATE OF BIRTH:  04-May-1970  DATE OF ADMISSION:  11/11/2010 DATE OF DISCHARGE:                      PSYCHIATRIC ADMISSION ASSESSMENT   HISTORY OF PRESENT ILLNESS:  This is a 40 year old Caucasian male.  The patient got a call from his ex "old lady" that "set him off."  He adds "I have been clear for 7 months at a time and had no contact with her or her family.  I called her father and her new boyfriend called me back twice and that was it.  I went looking for a liquor store supply.".  PAST PSYCHIATRIC:  The patient states he has had upward of 20 detoxes. He stayed 25 days at [facility] recently and 12 days at [facility].  He was recommended to go to IOP three times and never went because he relapsed or got enjailed.  AA meetings are just a place for him to hook up with old drinking buddies and get back into drinking.  SOCIAL HISTORY:  He is single, never married.  He has been in and out of a relationship with this particular lady for 13 years and has had only 2 months sober together with her.  FAMILY HISTORY:  His brother's daughter has bipolar disorder.  His paternal aunt has schizophrenia and all of his brothers had alcohol and drug problems.  His sisters are not afflicted with any kind of substance abuse problems.  ALCOHOL AND DRUG HISTORY:  He began using caffeine at age 38, nicotine at age 30, cannabis at age 9, alcohol age 51 to 67, cocaine between age 44 and 55, benzodiazepines at age 55, painkillers at age 61 but has never used heroin or any inhalants.  He does not have a primary care provider.  MEDICAL PROBLEMS:  He has hepatitis C, atrial fibrillation and chronic back pain.  MEDICATIONS:  He is on Prozac 40 mg, Neurontin what he believes to be 25 mg twice a day, Antabuse as well as  Pepcid.  ALLERGIES:  Unspecified.  POSITIVE FINDINGS:  The patient's BAL was 419.  His CBC was within normal limits.  CMP is not retrievable from the Epic system at this time.  Mental status:  The patient wanted to hurt himself because he had been drinking the night before he came in.  He denies now any suicidal or homicidal ideations.  Denies hallucination, illusion, delusions.  He had good eye contact.  He had clear, goal-directed thoughts.  He was able to focus well in a group and individual settings.  He has natural conversational speech volume, rate and tone.  He is oriented x4 and had recent remote memory that is intact.  His judgment was unable to sustain sobriety without significant help or supervision or a structured setting.  His insight is very limited.  DIAGNOSES:  Axis I:  Polysubstance abuse alcohol, cocaine and opiates. Axis II:  Deferred. Axis III:  Hepatitis C, atrial fibrillation and chronic back pain. Axis IV:  Moderate with housing and economic problems. Axis V:  Basic 35.          ______________________________ Orson Aloe, MD     EW/MEDQ  D:  11/13/2010  T:  11/13/2010  Job:  [id number]  Electronically Signed by Orson Aloe  on 11/21/2010 08:15:13 PM

## 2010-11-24 NOTE — ED Provider Notes (Signed)
Medical screening examination/treatment/procedure(s) were performed by non-physician practitioner and as supervising physician I was immediately available for consultation/collaboration.  Nat Christen, MD 11/24/10 [phone]

## 2010-12-05 NOTE — Discharge Summary (Signed)
NAME:  BLANCA, THORNTON NO.:  [id number]  MEDICAL RECORD NO.:  [id number]  LOCATION:                                 FACILITY:  PHYSICIAN:  Orson Aloe, MD       DATE OF BIRTH:  1970-07-28  DATE OF ADMISSION: DATE OF DISCHARGE:                              DISCHARGE SUMMARY   Blake Arnold is a 40 year old Caucasian male.  The patient got a call from his "ex old lady that set me off".  He adds "I have been clear for 7 months at a time and had no contact with her or her family.  I called her family and her boyfriend called me back twice and that was it.  I went looking for a liquor store supply".  POSITIVE FINDINGS:  BAL was 419 on admission.  On arrival at the emergency room his CBC was within normal limits.  CMP was not retrievable.  DIAGNOSES:  AXIS I:  On admission was polysubstance abuse, alcohol, cocaine and opiates. AXIS II:  Deferred. AXIS III:  Hepatitis C, atrial fibrillation, chronic back pain. AXIS IV:  Moderate with  and economic problems. AXIS V:  35.  The patient was admitted and started on a Librium detox, which he completed without success.  He received hepatitis A and B, third shots and get the record from [facility] is what we ordered.  Also reports CMP was within normal limits.  He was instructed to call the health department at 7:00 a.m. to get the A/V vaccinations.  The number was given to him.  Discharged home.  On progress notes it was noted that he was discharged home to his mother's home and picked up by his uncle.  CONDITION ON DISCHARGE:  The patient denies suicidal or homicidal ideation, denied hallucinations, illusions or delusions.  He had good eye contact and able to focus adequately, one-to-one group settings, had clear goal-directed thoughts.  He was alert and oriented x4.  His recent and remote memory intact.  Natural conversation, speech, volume, rate and tone.  His judgment was able to construct his own discharge  plan that seems safe and insight was improved slightly from admission.  DIAGNOSES AT DISCHARGE:  AXIS I:  Polysubstance abuse alcohol, cocaine and opiates. AXIS II:  Deferred. AXIS III:  Hepatitis C, atrial fibrillation, chronic back pain. AXIS IV:  Moderate housing, economic and psychosocial issues related to substance abuse. AXIS V:  50.  Also was instructed to follow up in the a.m.  RECOMMENDATION:  Resume typical diet, resume typical activity.  Continue Librium twice on second, twice on the third, once in the morning of the fourth as directed 25 mg, multivitamins, thiamine, nicotine patches, Neurontin/gabapentin continue his own that he had at home.  Prozac, he is supposed to be on this medicine but he does not know the doses, he states he has received samples from [facility] and to continue those samples. Also he was given Extra Strength Tylenol.  He is to follow up at [facility] on [address] on the fourth at 8:00 a.m. as well as call the health department at 7:30 a.m. at [phone] and when he is at [facility]  he is supposed to ask for IOP at [facility].          ______________________________ Orson Aloe, MD     EW/MEDQ  D:  11/30/2010  T:  11/30/2010  Job:  [id number]  Electronically Signed by Orson Aloe  on 12/05/2010 10:09:18 AM

## 2010-12-14 ENCOUNTER — Emergency Department (HOSPITAL_COMMUNITY)
Admission: EM | Admit: 2010-12-14 | Discharge: 2010-12-15 | Disposition: A | Discharge status: Home / Self Care | Payer: Self-pay | Attending: Emergency Medicine | Admitting: Emergency Medicine

## 2010-12-14 ENCOUNTER — Emergency Department (HOSPITAL_COMMUNITY): Payer: Self-pay

## 2010-12-14 DIAGNOSIS — I4891 Unspecified atrial fibrillation: Secondary | ICD-10-CM | POA: Insufficient documentation

## 2010-12-14 DIAGNOSIS — Z79899 Other long term (current) drug therapy: Secondary | ICD-10-CM | POA: Insufficient documentation

## 2010-12-14 DIAGNOSIS — R569 Unspecified convulsions: Secondary | ICD-10-CM | POA: Insufficient documentation

## 2010-12-14 DIAGNOSIS — R0602 Shortness of breath: Secondary | ICD-10-CM | POA: Insufficient documentation

## 2010-12-14 DIAGNOSIS — R079 Chest pain, unspecified: Secondary | ICD-10-CM | POA: Insufficient documentation

## 2010-12-14 DIAGNOSIS — T424X5A Adverse effect of benzodiazepines, initial encounter: Secondary | ICD-10-CM | POA: Insufficient documentation

## 2010-12-14 DIAGNOSIS — F1410 Cocaine abuse, uncomplicated: Secondary | ICD-10-CM | POA: Insufficient documentation

## 2010-12-14 DIAGNOSIS — R404 Transient alteration of awareness: Secondary | ICD-10-CM | POA: Insufficient documentation

## 2010-12-14 DIAGNOSIS — F1010 Alcohol abuse, uncomplicated: Secondary | ICD-10-CM | POA: Insufficient documentation

## 2010-12-14 LAB — URINALYSIS, ROUTINE W REFLEX MICROSCOPIC
Nitrite: NEGATIVE
Specific Gravity, Urine: 1.005 (ref 1.005–1.030)
Urobilinogen, UA: 0.2 mg/dL (ref 0.0–1.0)
pH: 5 (ref 5.0–8.0)

## 2010-12-14 LAB — DIFFERENTIAL
Lymphocytes Relative: 41 % (ref 12–46)
Lymphs Abs: 2.8 10*3/uL (ref 0.7–4.0)
Monocytes Absolute: 0.8 10*3/uL (ref 0.1–1.0)
Monocytes Relative: 11 % (ref 3–12)
Neutro Abs: 3.1 10*3/uL (ref 1.7–7.7)
Neutrophils Relative %: 44 % (ref 43–77)

## 2010-12-14 LAB — CBC
HCT: 50.1 % (ref 39.0–52.0)
Hemoglobin: 16.9 g/dL (ref 13.0–17.0)
MCH: 33.5 pg (ref 26.0–34.0)
MCV: 99.2 fL (ref 78.0–100.0)
RBC: 5.05 MIL/uL (ref 4.22–5.81)

## 2010-12-14 LAB — PROTIME-INR: Prothrombin Time: 12.9 seconds (ref 11.6–15.2)

## 2010-12-14 LAB — RAPID URINE DRUG SCREEN, HOSP PERFORMED
Benzodiazepines: POSITIVE — AB
Cocaine: POSITIVE — AB
Opiates: NOT DETECTED

## 2010-12-15 LAB — ETHANOL: Alcohol, Ethyl (B): 83 mg/dL — ABNORMAL HIGH (ref 0–11)

## 2010-12-15 LAB — COMPREHENSIVE METABOLIC PANEL
ALT: 85 U/L — ABNORMAL HIGH (ref 0–53)
Chloride: 103 mEq/L (ref 96–112)
GFR calc non Af Amer: >90 mL/min (ref >90)
Glucose, Bld: 92 mg/dL (ref 70–99)
Potassium: 3.6 mEq/L (ref 3.5–5.1)
Sodium: 142 mEq/L (ref 135–145)
Total Protein: 7.5 g/dL (ref 6.0–8.3)

## 2011-01-11 ENCOUNTER — Encounter: Payer: Self-pay | Admitting: Internal Medicine

## 2011-01-14 ENCOUNTER — Ambulatory Visit: Payer: Self-pay | Admitting: Internal Medicine

## 2011-01-22 ENCOUNTER — Encounter: Payer: Self-pay | Admitting: Internal Medicine

## 2011-01-22 ENCOUNTER — Other Ambulatory Visit: Payer: Self-pay

## 2011-01-22 ENCOUNTER — Emergency Department (HOSPITAL_COMMUNITY): Payer: Self-pay

## 2011-01-22 ENCOUNTER — Encounter (HOSPITAL_COMMUNITY): Payer: Self-pay | Admitting: *Deleted

## 2011-01-22 ENCOUNTER — Inpatient Hospital Stay (HOSPITAL_COMMUNITY)
Admission: EM | Admit: 2011-01-22 | Discharge: 2011-01-25 | DRG: 309 | Disposition: A | Discharge status: Home / Self Care | Payer: Self-pay | Attending: Internal Medicine | Admitting: Internal Medicine

## 2011-01-22 DIAGNOSIS — F1010 Alcohol abuse, uncomplicated: Secondary | ICD-10-CM

## 2011-01-22 DIAGNOSIS — B1920 Unspecified viral hepatitis C without hepatic coma: Secondary | ICD-10-CM | POA: Diagnosis present

## 2011-01-22 DIAGNOSIS — J219 Acute bronchiolitis, unspecified: Secondary | ICD-10-CM | POA: Diagnosis present

## 2011-01-22 DIAGNOSIS — R7402 Elevation of levels of lactic acid dehydrogenase (LDH): Secondary | ICD-10-CM | POA: Diagnosis present

## 2011-01-22 DIAGNOSIS — F10239 Alcohol dependence with withdrawal, unspecified: Secondary | ICD-10-CM

## 2011-01-22 DIAGNOSIS — R079 Chest pain, unspecified: Secondary | ICD-10-CM

## 2011-01-22 DIAGNOSIS — F17200 Nicotine dependence, unspecified, uncomplicated: Secondary | ICD-10-CM | POA: Diagnosis present

## 2011-01-22 DIAGNOSIS — F1910 Other psychoactive substance abuse, uncomplicated: Secondary | ICD-10-CM | POA: Diagnosis present

## 2011-01-22 DIAGNOSIS — F10931 Alcohol use, unspecified with withdrawal delirium: Secondary | ICD-10-CM | POA: Diagnosis present

## 2011-01-22 DIAGNOSIS — T65891A Toxic effect of other specified substances, accidental (unintentional), initial encounter: Secondary | ICD-10-CM | POA: Diagnosis present

## 2011-01-22 DIAGNOSIS — R7401 Elevation of levels of liver transaminase levels: Secondary | ICD-10-CM | POA: Diagnosis present

## 2011-01-22 DIAGNOSIS — J40 Bronchitis, not specified as acute or chronic: Secondary | ICD-10-CM | POA: Diagnosis present

## 2011-01-22 DIAGNOSIS — F10231 Alcohol dependence with withdrawal delirium: Secondary | ICD-10-CM | POA: Diagnosis present

## 2011-01-22 DIAGNOSIS — T510X4A Toxic effect of ethanol, undetermined, initial encounter: Secondary | ICD-10-CM | POA: Diagnosis present

## 2011-01-22 DIAGNOSIS — F1020 Alcohol dependence, uncomplicated: Secondary | ICD-10-CM | POA: Diagnosis present

## 2011-01-22 DIAGNOSIS — I4891 Unspecified atrial fibrillation: Principal | ICD-10-CM | POA: Diagnosis present

## 2011-01-22 HISTORY — DX: Anxiety disorder, unspecified: F41.9

## 2011-01-22 HISTORY — DX: Polyneuropathy, unspecified: G62.9

## 2011-01-22 HISTORY — DX: Other psychoactive substance abuse, uncomplicated: F19.10

## 2011-01-22 HISTORY — DX: Depression, unspecified: F32.A

## 2011-01-22 HISTORY — DX: Personal history of other specified conditions: Z87.898

## 2011-01-22 HISTORY — DX: Gastro-esophageal reflux disease without esophagitis: K21.9

## 2011-01-22 HISTORY — DX: Major depressive disorder, single episode, unspecified: F32.9

## 2011-01-22 HISTORY — DX: Essential (primary) hypertension: I10

## 2011-01-22 LAB — COMPREHENSIVE METABOLIC PANEL
ALT: 111 U/L — ABNORMAL HIGH (ref 0–53)
AST: 99 U/L — ABNORMAL HIGH (ref 0–37)
Albumin: 3.8 g/dL (ref 3.5–5.2)
Alkaline Phosphatase: 61 U/L (ref 39–117)
CO2: 24 mEq/L (ref 19–32)
Chloride: 104 mEq/L (ref 96–112)
Creatinine, Ser: 0.62 mg/dL (ref 0.50–1.35)
GFR calc non Af Amer: >90 mL/min (ref >90)
Potassium: 3.6 mEq/L (ref 3.5–5.1)
Total Bilirubin: 0.2 mg/dL — ABNORMAL LOW (ref 0.3–1.2)

## 2011-01-22 LAB — PROTIME-INR
INR: 0.94 (ref 0.00–1.49)
Prothrombin Time: 12.8 seconds (ref 11.6–15.2)

## 2011-01-22 LAB — RAPID URINE DRUG SCREEN, HOSP PERFORMED
Amphetamines: NOT DETECTED
Barbiturates: NOT DETECTED
Opiates: NOT DETECTED
Tetrahydrocannabinol: NOT DETECTED

## 2011-01-22 LAB — CBC
MCV: 97.4 fL (ref 78.0–100.0)
Platelets: 147 10*3/uL — ABNORMAL LOW (ref 150–400)
RBC: 4.95 MIL/uL (ref 4.22–5.81)
RDW: 12.8 % (ref 11.5–15.5)
WBC: 5.2 10*3/uL (ref 4.0–10.5)

## 2011-01-22 LAB — CARDIAC PANEL(CRET KIN+CKTOT+MB+TROPI)
CK, MB: 2.2 ng/mL (ref 0.3–4.0)
Relative Index: INVALID (ref 0.0–2.5)
Troponin I: <0.3 ng/mL (ref <0.30)

## 2011-01-22 LAB — PHOSPHORUS: Phosphorus: 3.8 mg/dL (ref 2.3–4.6)

## 2011-01-22 MED ORDER — LORAZEPAM 1 MG PO TABS
1.0000 mg | ORAL_TABLET | Freq: Four times a day (QID) | ORAL | Status: DC | PRN
  Administered 2011-01-22: 1 mg via ORAL
  Filled 2011-01-22: qty 1

## 2011-01-22 MED ORDER — THERA M PLUS PO TABS: 1.0000 | ORAL_TABLET | Freq: Every day | ORAL | Status: DC

## 2011-01-22 MED ORDER — LORAZEPAM 2 MG/ML IJ SOLN: 1.0000 mg | Freq: Four times a day (QID) | INTRAMUSCULAR | Status: DC | PRN

## 2011-01-22 MED ORDER — LORAZEPAM 1 MG PO TABS
1.0000 mg | ORAL_TABLET | Freq: Four times a day (QID) | ORAL | Status: DC | PRN
  Administered 2011-01-23 – 2011-01-24 (×4): 1 mg via ORAL
  Filled 2011-01-22 (×4): qty 1

## 2011-01-22 MED ORDER — SODIUM CHLORIDE 0.9 % IV SOLN
INTRAVENOUS | Status: DC
  Administered 2011-01-22 – 2011-01-23 (×2): via INTRAVENOUS

## 2011-01-22 MED ORDER — GABAPENTIN 300 MG PO CAPS
600.0000 mg | ORAL_CAPSULE | Freq: Two times a day (BID) | ORAL | Status: DC
  Administered 2011-01-22 – 2011-01-25 (×6): 600 mg via ORAL
  Filled 2011-01-22 (×8): qty 2

## 2011-01-22 MED ORDER — THERA M PLUS PO TABS
1.0000 | ORAL_TABLET | Freq: Every day | ORAL | Status: DC
  Administered 2011-01-22: 1 via ORAL
  Filled 2011-01-22 (×2): qty 1

## 2011-01-22 MED ORDER — THIAMINE HCL 100 MG/ML IJ SOLN
100.0000 mg | Freq: Every day | INTRAMUSCULAR | Status: DC
  Administered 2011-01-24: 100 mg via INTRAVENOUS
  Filled 2011-01-22 (×3): qty 2

## 2011-01-22 MED ORDER — VITAMIN B-1 100 MG PO TABS
100.0000 mg | ORAL_TABLET | Freq: Every day | ORAL | Status: DC
  Administered 2011-01-23 – 2011-01-25 (×2): 100 mg via ORAL
  Filled 2011-01-22 (×4): qty 1

## 2011-01-22 MED ORDER — ONDANSETRON HCL 4 MG/2ML IJ SOLN: 4.0000 mg | Freq: Four times a day (QID) | INTRAMUSCULAR | Status: DC | PRN

## 2011-01-22 MED ORDER — GABAPENTIN 600 MG PO TABS: 600.0000 mg | ORAL_TABLET | Freq: Two times a day (BID) | ORAL | Status: DC

## 2011-01-22 MED ORDER — FOLIC ACID 1 MG PO TABS
1.0000 mg | ORAL_TABLET | Freq: Every day | ORAL | Status: DC
  Administered 2011-01-23 – 2011-01-25 (×3): 1 mg via ORAL
  Filled 2011-01-22 (×3): qty 1

## 2011-01-22 MED ORDER — THIAMINE HCL 100 MG/ML IJ SOLN
Freq: Once | INTRAVENOUS | Status: DC
  Filled 2011-01-22: qty 1000

## 2011-01-22 MED ORDER — THIAMINE HCL 100 MG/ML IJ SOLN
Freq: Once | INTRAVENOUS | Status: AC
  Administered 2011-01-22: 13:00:00 via INTRAVENOUS
  Filled 2011-01-22: qty 1000

## 2011-01-22 MED ORDER — BACITRACIN-NEOMYCIN-POLYMYXIN 400-5-5000 EX OINT
1.0000 "application " | TOPICAL_OINTMENT | Freq: Three times a day (TID) | CUTANEOUS | Status: DC
  Administered 2011-01-22 – 2011-01-25 (×5): 1 via TOPICAL
  Filled 2011-01-22: qty 1

## 2011-01-22 MED ORDER — FLUOXETINE HCL 20 MG PO CAPS
40.0000 mg | ORAL_CAPSULE | Freq: Every day | ORAL | Status: DC
  Administered 2011-01-22 – 2011-01-25 (×4): 40 mg via ORAL
  Filled 2011-01-22 (×4): qty 2

## 2011-01-22 MED ORDER — CHLORDIAZEPOXIDE HCL 25 MG PO CAPS
25.0000 mg | ORAL_CAPSULE | Freq: Two times a day (BID) | ORAL | Status: DC
  Administered 2011-01-22 – 2011-01-25 (×6): 25 mg via ORAL
  Filled 2011-01-22 (×6): qty 1

## 2011-01-22 MED ORDER — ONDANSETRON HCL 4 MG PO TABS: 4.0000 mg | ORAL_TABLET | Freq: Four times a day (QID) | ORAL | Status: DC | PRN

## 2011-01-22 NOTE — H&P (Signed)
Hospital Admission Note Date: 01/22/2011  PCP: No primary provider on file.  Chief Complaint: He wants to detox from alcohol, chest pain.  History of Present Illness: This is a 40 year old with past medical history of alcohol abuse, hepatitis C, Seizure  from alcohol withdrawal. He presented to the emergency department because he want to quit drinking alcohol. His last alcohol drink was at 4 AM the day of admission. He is also complaining of chest pain, specially when he cough. He has cough for last 2 weeks. He relates night sweat. He is tremorous.   Allergies: Codeine; Hydrocodone; and Percocet Past Medical History  Diagnosis Date  . Hepatitis C   . Chronic back pain   . A-fib   . Depression   . Anxiety   . Hepatitis C     history of  . Chronic back pain   . Acid reflux   . History of urinary frequency   . Peripheral neuropathy     hands and feet  . Hypertension    Prior to Admission medications   Medication Sig Start Date End Date Taking? Authorizing Provider  chlordiazePOXIDE (LIBRIUM) 25 MG capsule Take 25 mg by mouth 3 (three) times daily.     Yes Historical Provider, MD  FLUoxetine (PROZAC) 40 MG capsule Take 40 mg by mouth daily.     Yes Historical Provider, MD  gabapentin (NEURONTIN) 600 MG tablet Take 600 mg by mouth 2 (two) times daily.     Yes Historical Provider, MD  guaiFENesin (ROBITUSSIN) 100 MG/5ML SOLN Take 20 mLs by mouth every 4 (four) hours as needed. For cough    Yes Historical Provider, MD  ibuprofen (ADVIL,MOTRIN) 200 MG tablet Take 400 mg by mouth every 6 (six) hours as needed. For pain    Yes Historical Provider, MD  neomycin-bacitracin-polymyxin (NEOSPORIN) ointment Apply 1 application topically 3 (three) times daily. Applied to tattoo on back    Yes Historical Provider, MD   Past Surgical History  Procedure Date  . Cardioversion 03/07/2006   History reviewed. No pertinent family history. History   Social History  . Marital Status: Single   Spouse Name: N/A    Number of Children: N/A  . Years of Education: N/A   Occupational History  . Not on file.   Social History Main Topics  . Smoking status: Current Everyday Smoker -- 1.0 packs/day for 25 years    Types: Cigarettes  . Smokeless tobacco: Never Used  . Alcohol Use: 14.4 oz/week    24 Cans of beer per week  . Drug Use: Yes    Special: Cocaine, "Crack" cocaine  . Sexually Active: No   Other Topics Concern  . Not on file   Social History Narrative  . No narrative on file   Review of Systems: Pertinent items are noted in HPI. Physical Exam: Filed Vitals:   01/22/11 1022 01/22/11 1128  BP: 141/86 123/76  Pulse: 89   Temp: 97.9 F (36.6 C) 97.7 F (36.5 C)  TempSrc: Oral Oral  Resp: 18 22  SpO2: 98%    BP 123/76  Pulse 89  Temp(Src) 97.7 F (36.5 C) (Oral)  Resp 22  SpO2 98%  General Appearance:    Alert, cooperative, no distress, appears stated age  Head:    Normocephalic, without obvious abnormality, atraumatic  Eyes:    PERRL, conjunctiva/corneas clear, EOM's intact, fundi    benign, both eyes       Ears:    Normal TM's and  external ear canals, both ears  Nose:   Nares normal, septum midline, mucosa normal, no drainage    or sinus tenderness  Throat:   Lips, mucosa, and tongue normal; teeth and gums normal  Neck:   Supple, symmetrical, trachea midline, no adenopathy;       thyroid:  No enlargement/tenderness/nodules; no carotid   bruit or JVD  Back:     Symmetric, no curvature, ROM normal, no CVA tenderness  Lungs:    rhonchi bilaterally, respirations unlabored  Chest wall:    No tenderness or deformity  Heart:    Regular rate and rhythm, S1 and S2 normal, no murmur, rub   or gallop  Abdomen:     Soft, non-tender, bowel sounds active all four quadrants,    no masses, no organomegaly        Extremities:   Extremities normal, atraumatic, no cyanosis or edema  Pulses:   2+ and symmetric all extremities  Skin:   Skin color, texture, turgor  normal, no rashes or lesions     Neurologic:   CNII-XII intact. Normal strength, sensation and reflexes      Throughout, fine tremors of hands.   Lab results:  [facility] 01/22/11 1115  NA 141  K 3.6  CL 104  CO2 24  GLUCOSE 99  BUN 4*  CREATININE 0.62  CALCIUM 9.0  MG --  PHOS --    Basename 01/22/11 1115  AST 99*  ALT 111*  ALKPHOS 61  BILITOT 0.2*  PROT 7.8  ALBUMIN 3.8    Basename 01/22/11 1115  WBC 5.2  NEUTROABS --  HGB 16.9  HCT 48.2  MCV 97.4  PLT 147*   Imaging results:  No results found. Other results: ZOX:WRUEA, no st elevation.    Patient Active Hospital Problem List  FIBRILLATION, ATRIAL (06/08/2009) EKG done earlier today showed sinus rhythm. Patient is not on any medication. I he  needs  medication for rate control we can consider Cardizem. Avoid B- blockers due to to his history of cocaine abuse.  TOBACCO ABUSE (02/09/2010)  I will consult smoking cessation counseling.   Alcohol abuse (01/22/2011)/Withdrawal.   Patient started to have some signs of alcohol withdrawal ,  tremors of his hands.  I will continue with thiamine and folate . I will continue with Librium  25 mg by mouth twice a day. I will continue with CIWA protocol. I asked emergency department Physician to consult psychiatric.  He has significant history of seizure related to alcohol withdrawal. Seizure precaution.   Chest pain (01/22/2011) UDS was negative for cocaine. I will check cardiac enzymes, chest x-ray. Chest pain is probably related to cough.   Transaminases: Probably related to alcohol use and history of hepatitis C. I will check HIV.   Florentina Marquart M.D. [facility] [phone] 01/22/2011, 1:27 PM

## 2011-01-22 NOTE — ED Notes (Signed)
Bed:[facility]<BR> Expected date:01/22/11<BR> Expected time:<BR> Means of arrival:Ambulance<BR> Comments:<BR> Medical Clearance

## 2011-01-22 NOTE — ED Notes (Signed)
Pt states last drink 4am today, requests detox and assist w ETOH abuse. No tremors at this time, reports poss seizure last pm. MD requests room for poss med admit.

## 2011-01-22 NOTE — Progress Notes (Signed)
[facility] with assist from [facility] CM but pt confirms he did not attend the appointment.  CM confirmed with [facility] that pt is eligible for indigent medication assistance upon discharge if needed.  Pt states agreed to referral to [facility] for possible assist with [facility] and given list of [place] self pay pcps Pt interested in going to [facility] for follow up care if unable to return to [facility]

## 2011-01-22 NOTE — ED Notes (Signed)
HIV test added to blood already in lab per Daisy, phleb

## 2011-01-22 NOTE — ED Provider Notes (Signed)
History     CSN: [id number] Arrival date & time: 01/22/2011  9:44 AM   First MD Initiated Contact with Patient 01/22/11 [phone]      Chief Complaint  Patient presents with  . Medical Clearance  . Delirium Tremens (DTS)    (Consider location/radiation/quality/duration/timing/severity/associated sxs/prior treatment) The history is provided by the patient.   patient is requesting detox off alcohol. He states he drinks about a fifth of liquor or 7 40s a day. He last drank at about 4 in the morning. He states he started to feel withdrawal period. He states that he has had seizures with this withdrawal in the past. She's not suicidal or homicidal. He states he's had nausea and vomiting. States this is more chronic in onset associated with this withdrawal. States he also has a history of atrial fibrillation and states he feels as if he is in A. fib right now. He states he also has hepatitis C, he's not sure how he got up. He states he is not aware of other liver problems though. He states he has had some seizures are not associated with this withdrawal period he states he is mostly on Librium, but cannot afford. Past Medical History  Diagnosis Date  . Hepatitis C   . Chronic back pain   . A-fib     History reviewed. No pertinent past surgical history.  No family history on file.  History  Substance Use Topics  . Smoking status: Current Everyday Smoker  . Smokeless tobacco: Not on file  . Alcohol Use: 14.4 oz/week    24 Cans of beer per week      Review of Systems  Constitutional: Negative for activity change and appetite change.  HENT: Negative for neck stiffness.   Eyes: Negative for pain.  Respiratory: Negative for chest tightness and shortness of breath.   Cardiovascular: Negative for chest pain and leg swelling.  Gastrointestinal: Positive for nausea. Negative for vomiting, abdominal pain and diarrhea.  Genitourinary: Negative for flank pain.  Musculoskeletal: Negative for  back pain.  Skin: Negative for rash.  Neurological: Positive for tremors. Negative for weakness, numbness and headaches.  Psychiatric/Behavioral: Negative for behavioral problems and confusion.    Allergies  Codeine; Hydrocodone; and Percocet  Home Medications   Current Outpatient Rx  Name Route Sig Dispense Refill  . CHLORDIAZEPOXIDE HCL 25 MG PO CAPS Oral Take 25 mg by mouth 3 (three) times daily.      [name] FLUOXETINE HCL 40 MG PO CAPS Oral Take 40 mg by mouth daily.      [name] GABAPENTIN 600 MG PO TABS Oral Take 600 mg by mouth 2 (two) times daily.      . GUAIFENESIN 100 MG/5ML PO SOLN Oral Take 20 mLs by mouth every 4 (four) hours as needed. For cough     . IBUPROFEN 200 MG PO TABS Oral Take 400 mg by mouth every 6 (six) hours as needed. For pain     . BACITRACIN-NEOMYCIN-POLYMYXIN [id number] EX OINT Topical Apply 1 application topically 3 (three) times daily. Applied to tattoo on back       BP 123/76  Pulse 89  Temp(Src) 97.7 F (36.5 C) (Oral)  Resp 22  SpO2 98%  Physical Exam  Nursing note and vitals reviewed. Constitutional: He is oriented to person, place, and time. He appears well-developed and well-nourished.  HENT:  Head: Normocephalic and atraumatic.  Eyes: EOM are normal. Pupils are equal, round, and reactive to light.  Neck: Normal range of  motion. Neck supple.  Cardiovascular: Normal rate and normal heart sounds.   No murmur heard.      irregular pulse  Pulmonary/Chest: Effort normal and breath sounds normal.  Abdominal: Soft. Bowel sounds are normal. He exhibits no distension and no mass. There is no tenderness. There is no rebound and no guarding.  Musculoskeletal: Normal range of motion. He exhibits no edema.  Neurological: He is alert and oriented to person, place, and time. No cranial nerve deficit.  Skin: Skin is warm and dry.  Psychiatric: He has a normal mood and affect.    ED Course  Procedures (including critical care time)  Labs Reviewed  CBC -  Abnormal; Notable for the following:    MCH 34.1 (*)    Platelets 147 (*)    All other components within normal limits  COMPREHENSIVE METABOLIC PANEL - Abnormal; Notable for the following:    BUN 4 (*)    AST 99 (*)    ALT 111 (*)    Total Bilirubin 0.2 (*)    All other components within normal limits  ETHANOL - Abnormal; Notable for the following:    Alcohol, Ethyl (B) 223 (*)    All other components within normal limits  PROTIME-INR  URINE RAPID DRUG SCREEN (HOSP PERFORMED)   No results found.   1. Alcohol withdrawal     Date: 01/22/2011  Rate: 80  Rhythm: normal sinus rhythm  QRS Axis: normal  Intervals: normal  ST/T Wave abnormalities: normal  Conduction Disutrbances:none  Narrative Interpretation:   Old EKG Reviewed: unchanged     MDM  Patient has had alcohol abuse for years. He is requesting detox. He states he drinks about a fifth a day. He has had seizures in the past. He last drank about 4 in the morning. He states he started to feel more anxious. Laboratories overall reassuring him somewhat stable. Due to the history of seizures he'll be admitted to medicine.        Juliet Rude. Rubin Payor, MD 01/22/11 1249

## 2011-01-22 NOTE — ED Notes (Signed)
Pt given a sprite and then taken to restroom.  Seizure precautions initiated.

## 2011-01-22 NOTE — Progress Notes (Signed)
ED CM noted CM consult from Admission RN Pt having trouble paying bills ED CM spoke with pt who confirmed he is having trouble paying his hospital and home bills.  CM discussed referral to financial counselor and sw referral Pt agreed to referrals to financial counselor and sw.  Pt states he has applied to social services and has been denied for disability.  Has another appointment at DSS on 01/28/11.

## 2011-01-22 NOTE — ED Notes (Signed)
Pt brought in by [facility]. Requesting detox from ETOH. C/o tremors.

## 2011-01-22 NOTE — Progress Notes (Signed)
ED CM spoke with [place] at [facility], [employer] [address]. Made eligibility appointment for Friday February 29 1428 & one time follow up appointment for Friday March 29, 2011 at  1415 with Dr Clelia Croft Upon d/c please Fax d/c summary to [phone] per [facility]

## 2011-01-22 NOTE — ED Notes (Signed)
Report attempted and unable to take

## 2011-01-23 ENCOUNTER — Encounter (HOSPITAL_COMMUNITY): Payer: Self-pay | Admitting: *Deleted

## 2011-01-23 ENCOUNTER — Other Ambulatory Visit: Payer: Self-pay

## 2011-01-23 LAB — COMPREHENSIVE METABOLIC PANEL
ALT: 90 U/L — ABNORMAL HIGH (ref 0–53)
AST: 68 U/L — ABNORMAL HIGH (ref 0–37)
CO2: 28 mEq/L (ref 19–32)
Calcium: 9.1 mg/dL (ref 8.4–10.5)
Chloride: 104 mEq/L (ref 96–112)
Creatinine, Ser: 0.82 mg/dL (ref 0.50–1.35)
GFR calc Af Amer: >90 mL/min (ref >90)
GFR calc non Af Amer: >90 mL/min (ref >90)
Glucose, Bld: 96 mg/dL (ref 70–99)
Sodium: 137 mEq/L (ref 135–145)
Total Bilirubin: 0.9 mg/dL (ref 0.3–1.2)

## 2011-01-23 LAB — CBC
Hemoglobin: 15.2 g/dL (ref 13.0–17.0)
MCH: 33.2 pg (ref 26.0–34.0)
MCHC: 33.6 g/dL (ref 30.0–36.0)
Platelets: 127 10*3/uL — ABNORMAL LOW (ref 150–400)

## 2011-01-23 LAB — CARDIAC PANEL(CRET KIN+CKTOT+MB+TROPI)
CK, MB: 2.2 ng/mL (ref 0.3–4.0)
Troponin I: <0.3 ng/mL (ref <0.30)

## 2011-01-23 MED ORDER — AZITHROMYCIN 250 MG PO TABS
250.0000 mg | ORAL_TABLET | Freq: Every day | ORAL | Status: DC
  Administered 2011-01-24 – 2011-01-25 (×2): 250 mg via ORAL
  Filled 2011-01-23 (×3): qty 1

## 2011-01-23 MED ORDER — AZITHROMYCIN 500 MG PO TABS
500.0000 mg | ORAL_TABLET | Freq: Every day | ORAL | Status: AC
  Administered 2011-01-23: 500 mg via ORAL
  Filled 2011-01-23: qty 1

## 2011-01-23 NOTE — Progress Notes (Signed)
Subjective: No specific complaints.  Interested in wanting to be detoxed off of alcohol.  Objective: Vital signs in last 24 hours: Filed Vitals:   01/23/11 0029 01/23/11 0548 01/23/11 0800 01/23/11 1412  BP: 134/77 136/91 136/91 126/90  Pulse: 74 57 62 61  Temp: 97.8 F (36.6 C) 98.3 F (36.8 C)  98 F (36.7 C)  TempSrc: Oral Oral  Oral  Resp: 18 18  20   Height: 5\' 8"  (1.727 m)     Weight: 78.1 kg (172 lb 2.9 oz)     SpO2: 96% 100%  100%   Weight change:   Intake/Output Summary (Last 24 hours) at 01/23/11 1708 Last data filed at 01/23/11 1500  Gross per 24 hour  Intake 1378.75 ml  Output    900 ml  Net 478.75 ml    Physical Exam: General: Awake, Oriented, No acute distress. HEENT: EOMI. Neck: Supple CV: S1 and S2 Lungs: Clear to ascultation bilaterally Abdomen: Soft, Nontender, Nondistended, +bowel sounds. Ext: Good pulses. Trace edema.   Lab Results:  [facility] 01/23/11 0500 01/22/11 2120 01/22/11 1115  NA 137 -- 141  K 3.9 -- 3.6  CL 104 -- 104  CO2 28 -- 24  GLUCOSE 96 -- 99  BUN 9 -- 4*  CREATININE 0.82 -- 0.62  CALCIUM 9.1 -- 9.0  MG -- 1.8 --  PHOS -- 3.8 --    Basename 01/23/11 0500 01/22/11 1115  AST 68* 99*  ALT 90* 111*  ALKPHOS 61 61  BILITOT 0.9 0.2*  PROT 6.5 7.8  ALBUMIN 3.2* 3.8   No results found for this basename: LIPASE:2,AMYLASE:2 in the last 72 hours  Basename 01/23/11 0500 01/22/11 1115  WBC 6.2 5.2  NEUTROABS -- --  HGB 15.2 16.9  HCT 45.3 48.2  MCV 98.9 97.4  PLT 127* 147*    Basename 01/23/11 0500 01/22/11 2120 01/22/11 1115  CKTOTAL 68 81 110  CKMB 2.2 2.2 2.3  CKMBINDEX -- -- --  TROPONINI <0.30 <0.30 <0.30   No components found with this basename: POCBNP:3 No results found for this basename: DDIMER:2 in the last 72 hours No results found for this basename: HGBA1C:2 in the last 72 hours No results found for this basename: CHOL:2,HDL:2,LDLCALC:2,TRIG:2,CHOLHDL:2,LDLDIRECT:2 in the last 72 hours No results found  for this basename: TSH,T4TOTAL,FREET3,T3FREE,THYROIDAB in the last 72 hours No results found for this basename: VITAMINB12:2,FOLATE:2,FERRITIN:2,TIBC:2,IRON:2,RETICCTPCT:2 in the last 72 hours  Micro Results: No results found for this or any previous visit (from the past 240 hour(s)).  Studies/Results: Dg Chest 2 View  01/22/2011  *RADIOLOGY REPORT*  Clinical Data: Cough and chest pain.  CHEST - 2 VIEW  Comparison: Chest x-ray 12/14/2010.  Findings: The cardiac silhouette, mediastinal and hilar contours are within normal limits and stable.  The lungs are clear.  No pleural effusion.  The bony thorax is intact.  IMPRESSION: No acute cardiopulmonary findings.  Original Report Authenticated By: P. Loralie Champagne, M.D.    Medications: I have reviewed the patient's current medications. Scheduled Meds:   . chlordiazePOXIDE  25 mg Oral BID WC  . FLUoxetine  40 mg Oral Daily  . folic acid  1 mg Oral Daily  . gabapentin  600 mg Oral BID  . neomycin-bacitracin-polymyxin  1 application Topical TID  . thiamine  100 mg Oral Daily   Or  . thiamine  100 mg Intravenous Daily  . DISCONTD: gabapentin  600 mg Oral BID  . DISCONTD: multivitamins ther. w/minerals  1 tablet Oral Daily  . DISCONTD:  general admission iv infusion   Intravenous Once   Continuous Infusions:   . sodium chloride 125 mL/hr at 01/23/11 0600   PRN Meds:.LORazepam, LORazepam, ondansetron (ZOFRAN) IV, ondansetron  Assessment/Plan: 1. FIBRILLATION, ATRIAL, currently in sinus rhythm.  Also not on any medication at this time.  Rate controlled.  Would continue to avoid beta blockers given patient's history of cocaine abuse.  2.  Polysubstance abuse.  Patient is interested in alcohol cessation.  3. Tobacco use.  Counseled on cessation.  4. Chest pain.  Ruled out for acute coronary syndrome.  Troponins negative.  Suspect is likely due to cough.  5.  Cough.  Will start the patient on azithromycin for possible bronchitis.  6.   Transaminitis.  Likely due to alcohol use and history of hepatitis C.  7.  Alcohol abuse.  Currently on CIWA protocol.  Psychiatry consultation to help make further recommendations.  8. Disposition pending.   LOS: 1 day  Katlynn Naser A, MD 01/23/2011, 5:08 PM
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
## 2011-01-24 NOTE — Progress Notes (Signed)
01/24/11 Neshia Mckenzie RN,BSN NCM [phone].QUALIFIES FOR INDIGENT FUNDS,INFORMED OF PHARMACY POLICY.WILL NEED 2 SETS SCRIPTS.ALSO PROVIDED W[facility].ELIGIBILITY/[facility] APPT SET.

## 2011-01-24 NOTE — Discharge Planning (Signed)
Discharge instructions given to pt, verbalized understanding, left the unit in stable condition. 

## 2011-01-24 NOTE — Progress Notes (Signed)
Subjective: No specific complaints.  Still has intermittent cough.  Objective: Vital signs in last 24 hours: Filed Vitals:   01/23/11 1412 01/23/11 2121 01/24/11 0542 01/24/11 1437  BP: 126/90 130/86 127/90 136/89  Pulse: 61 61 73 80  Temp: 98 F (36.7 C) 98.5 F (36.9 C) 98.1 F (36.7 C) 97.6 F (36.4 C)  TempSrc: Oral Oral Oral Oral  Resp: 20 18 18 18   Height:      Weight:      SpO2: 100% 100% 100% 99%   Weight change:   Intake/Output Summary (Last 24 hours) at 01/24/11 1724 Last data filed at 01/24/11 1300  Gross per 24 hour  Intake   3480 ml  Output   3525 ml  Net    -45 ml    Physical Exam: General: Awake, Oriented, No acute distress. HEENT: EOMI. Neck: Supple CV: S1 and S2 Lungs: Clear to ascultation bilaterally Abdomen: Soft, Nontender, Nondistended, +bowel sounds. Ext: Good pulses. Trace edema.   Lab Results:  [facility] 01/23/11 0500 01/22/11 2120 01/22/11 1115  NA 137 -- 141  K 3.9 -- 3.6  CL 104 -- 104  CO2 28 -- 24  GLUCOSE 96 -- 99  BUN 9 -- 4*  CREATININE 0.82 -- 0.62  CALCIUM 9.1 -- 9.0  MG -- 1.8 --  PHOS -- 3.8 --    Basename 01/23/11 0500 01/22/11 1115  AST 68* 99*  ALT 90* 111*  ALKPHOS 61 61  BILITOT 0.9 0.2*  PROT 6.5 7.8  ALBUMIN 3.2* 3.8   No results found for this basename: LIPASE:2,AMYLASE:2 in the last 72 hours  Basename 01/23/11 0500 01/22/11 1115  WBC 6.2 5.2  NEUTROABS -- --  HGB 15.2 16.9  HCT 45.3 48.2  MCV 98.9 97.4  PLT 127* 147*    Basename 01/23/11 0500 01/22/11 2120 01/22/11 1115  CKTOTAL 68 81 110  CKMB 2.2 2.2 2.3  CKMBINDEX -- -- --  TROPONINI <0.30 <0.30 <0.30   No components found with this basename: POCBNP:3 No results found for this basename: DDIMER:2 in the last 72 hours No results found for this basename: HGBA1C:2 in the last 72 hours No results found for this basename: CHOL:2,HDL:2,LDLCALC:2,TRIG:2,CHOLHDL:2,LDLDIRECT:2 in the last 72 hours No results found for this basename:  TSH,T4TOTAL,FREET3,T3FREE,THYROIDAB in the last 72 hours No results found for this basename: VITAMINB12:2,FOLATE:2,FERRITIN:2,TIBC:2,IRON:2,RETICCTPCT:2 in the last 72 hours  Micro Results: No results found for this or any previous visit (from the past 240 hour(s)).  Studies/Results: No results found.  Medications: I have reviewed the patient's current medications. Scheduled Meds:    . azithromycin  500 mg Oral Daily   Followed by  . azithromycin  250 mg Oral Daily  . chlordiazePOXIDE  25 mg Oral BID WC  . FLUoxetine  40 mg Oral Daily  . folic acid  1 mg Oral Daily  . gabapentin  600 mg Oral BID  . neomycin-bacitracin-polymyxin  1 application Topical TID  . thiamine  100 mg Oral Daily   Or  . thiamine  100 mg Intravenous Daily   Continuous Infusions:    . DISCONTD: sodium chloride 125 mL/hr at 01/23/11 0600   PRN Meds:.LORazepam, LORazepam, ondansetron (ZOFRAN) IV, ondansetron  Assessment/Plan: 1. History of Arnold.fib. Stable. Not on any medication at this time.  Rate controlled.  Would continue to avoid beta blockers given patient's history of cocaine abuse.  2.  Polysubstance abuse.  Patient is interested in alcohol cessation.  3. Tobacco use.  Counseled on cessation.  4. Chest  pain.  Ruled out for acute coronary syndrome.  Troponins negative.  Suspect is likely due to cough.  5.  Cough.  Will start the patient on azithromycin for possible bronchitis.  6.  Transaminitis.  Likely due to alcohol use and history of hepatitis C.  7.  Alcohol abuse.  Currently on CIWA protocol.  Appreciate Child psychotherapist input.  8. Disposition pending.  Consider discharge tomorrow with outpatient mental health treatment.   LOS: 2 days  Blake Kamer A, MD 01/24/2011, 5:24 PM

## 2011-01-24 NOTE — Progress Notes (Signed)
UR review completed. 

## 2011-01-25 ENCOUNTER — Encounter (HOSPITAL_COMMUNITY): Payer: Self-pay | Admitting: Internal Medicine

## 2011-01-25 DIAGNOSIS — F10239 Alcohol dependence with withdrawal, unspecified: Secondary | ICD-10-CM

## 2011-01-25 DIAGNOSIS — F1910 Other psychoactive substance abuse, uncomplicated: Secondary | ICD-10-CM

## 2011-01-25 DIAGNOSIS — J219 Acute bronchiolitis, unspecified: Secondary | ICD-10-CM | POA: Diagnosis present

## 2011-01-25 HISTORY — DX: Other psychoactive substance abuse, uncomplicated: F19.10

## 2011-01-25 MED ORDER — FOLIC ACID 1 MG PO TABS: 1.0000 mg | ORAL_TABLET | Freq: Every day | ORAL | Status: DC

## 2011-01-25 MED ORDER — CHLORDIAZEPOXIDE HCL 25 MG PO CAPS: 25.0000 mg | ORAL_CAPSULE | Freq: Every day | ORAL | Status: AC

## 2011-01-25 MED ORDER — AZITHROMYCIN 250 MG PO TABS: 250.0000 mg | ORAL_TABLET | Freq: Every day | ORAL | Status: DC

## 2011-01-25 MED ORDER — ASPIRIN 81 MG PO TBEC: 81.0000 mg | DELAYED_RELEASE_TABLET | Freq: Every day | ORAL | Status: DC

## 2011-01-25 MED ORDER — FLUOXETINE HCL 40 MG PO CAPS: 40.0000 mg | ORAL_CAPSULE | Freq: Every day | ORAL | Status: DC

## 2011-01-25 MED ORDER — THIAMINE HCL 100 MG PO TABS: 100.0000 mg | ORAL_TABLET | Freq: Every day | ORAL | Status: DC

## 2011-01-25 NOTE — Progress Notes (Signed)
Pt reported feeling better today and stated that he's ready to d/c.  Provided Pt with outpt tx information for mental health and offered to schedule an appt on his behalf.  Pt declined, stating that he needs to coordinate this with his mom who will be providing transportation for him.  Pt to be d/c'd today.  CSW to sign off.  Providence Crosby, LCSWA Clinical Social Work [phone]

## 2011-01-25 NOTE — Progress Notes (Signed)
Discharge instructions given to pt, verbalized understanding. Left the unit in stable condition. 
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
## 2011-01-25 NOTE — Progress Notes (Signed)
Subjective: No specific complaints, wants to go home today.  Objective: Vital signs in last 24 hours: Filed Vitals:   01/24/11 0542 01/24/11 1437 01/24/11 2137 01/25/11 0532  BP: 127/90 136/89 133/89 132/87  Pulse: 73 80 70 65  Temp: 98.1 F (36.7 C) 97.6 F (36.4 C) 97.7 F (36.5 C) 97.3 F (36.3 C)  TempSrc: Oral Oral Oral Oral  Resp: 18 18 16 18   Height:      Weight:      SpO2: 100% 99% 99% 98%   Weight change:   Intake/Output Summary (Last 24 hours) at 01/25/11 1224 Last data filed at 01/24/11 1300  Gross per 24 hour  Intake    240 ml  Output    700 ml  Net   -460 ml    Physical Exam: General: Awake, Oriented, No acute distress. HEENT: EOMI. Neck: Supple CV: S1 and S2 Lungs: Clear to ascultation bilaterally Abdomen: Soft, Nontender, Nondistended, +bowel sounds. Ext: Good pulses. Trace edema.   Lab Results:  Basename 01/23/11 0500 01/22/11 2120  NA 137 --  K 3.9 --  CL 104 --  CO2 28 --  GLUCOSE 96 --  BUN 9 --  CREATININE 0.82 --  CALCIUM 9.1 --  MG -- 1.8  PHOS -- 3.8    Basename 01/23/11 0500  AST 68*  ALT 90*  ALKPHOS 61  BILITOT 0.9  PROT 6.5  ALBUMIN 3.2*   No results found for this basename: LIPASE:2,AMYLASE:2 in the last 72 hours  Basename 01/23/11 0500  WBC 6.2  NEUTROABS --  HGB 15.2  HCT 45.3  MCV 98.9  PLT 127*    Basename 01/23/11 0500 01/22/11 2120  CKTOTAL 68 81  CKMB 2.2 2.2  CKMBINDEX -- --  TROPONINI <0.30 <0.30   No components found with this basename: POCBNP:3 No results found for this basename: DDIMER:2 in the last 72 hours No results found for this basename: HGBA1C:2 in the last 72 hours No results found for this basename: CHOL:2,HDL:2,LDLCALC:2,TRIG:2,CHOLHDL:2,LDLDIRECT:2 in the last 72 hours No results found for this basename: TSH,T4TOTAL,FREET3,T3FREE,THYROIDAB in the last 72 hours No results found for this basename: VITAMINB12:2,FOLATE:2,FERRITIN:2,TIBC:2,IRON:2,RETICCTPCT:2 in the last 72  hours  Micro Results: No results found for this or any previous visit (from the past 240 hour(s)).  Studies/Results: No results found.  Medications: I have reviewed the patient's current medications. Scheduled Meds:    . azithromycin  250 mg Oral Daily  . chlordiazePOXIDE  25 mg Oral BID WC  . FLUoxetine  40 mg Oral Daily  . folic acid  1 mg Oral Daily  . gabapentin  600 mg Oral BID  . neomycin-bacitracin-polymyxin  1 application Topical TID  . thiamine  100 mg Oral Daily   Or  . thiamine  100 mg Intravenous Daily   Continuous Infusions:   PRN Meds:.LORazepam, LORazepam, ondansetron (ZOFRAN) IV, ondansetron  Assessment/Plan: 1. History of a.fib. Stable. Not on any medication at this time.  Rate controlled.  Would continue to avoid beta blockers given patient's history of cocaine abuse.  2.  Polysubstance abuse.  Patient is interested in alcohol cessation.  3. Tobacco use.  Counseled on cessation.  4. Chest pain.  Ruled out for acute coronary syndrome.  Troponins negative.  Suspect is likely due to cough.  5.  Cough.  On azithromycin for possible bronchitis.  6.  Transaminitis.  Likely due to alcohol use and history of hepatitis C.  7.  Alcohol abuse.  Currently on CIWA protocol.  Appreciate Child psychotherapist  input.  8. Disposition. Discharge home outpatient mental health treatment.   LOS: 3 days  Nasiya Pascual A, MD 01/25/2011, 12:24 PM

## 2011-01-25 NOTE — Discharge Summary (Signed)
Discharge Summary  Blake Arnold MR#: [id number]  DOB:12-May-1970  Date of Admission: 01/22/2011 Date of Discharge: 01/25/2011  Patient's PCP: No primary provider on file.  Attending Physician:Jashun Puertas A  Consults: Dr. Rogers Blocker - Psychiatry.  Discharge Diagnoses: Active Problems:  FIBRILLATION, ATRIAL  TOBACCO ABUSE  Alcohol abuse  Chest pain Bronchitis Polysubstance abuse  Brief Admitting History and Physical 40 year old male with history of alcohol abuse, hepatitis C, seizure from alcohol withdrawal presented on 01/22/2011 with complaints of wanting detox from alcohol and chest pain.  Discharge Medications Current Discharge Medication List    START taking these medications   Details  aspirin 81 MG EC tablet Take 1 tablet (81 mg total) by mouth daily. Swallow whole. Qty: 30 tablet, Refills: 0    azithromycin (ZITHROMAX) 250 MG tablet Take 1 tablet (250 mg total) by mouth daily. Qty: 4 each, Refills: 0    folic acid (FOLVITE) 1 MG tablet Take 1 tablet (1 mg total) by mouth daily. Qty: 30 tablet, Refills: 0    thiamine 100 MG tablet Take 1 tablet (100 mg total) by mouth daily. Qty: 30 tablet, Refills: 0      CONTINUE these medications which have CHANGED   Details  chlordiazePOXIDE (LIBRIUM) 25 MG capsule Take 1 capsule (25 mg total) by mouth at bedtime. For 4 days. Qty: 4 capsule, Refills: 0    FLUoxetine (PROZAC) 40 MG capsule Take 1 capsule (40 mg total) by mouth daily. Qty: 30 capsule, Refills: 0      CONTINUE these medications which have NOT CHANGED   Details  gabapentin (NEURONTIN) 600 MG tablet Take 600 mg by mouth 2 (two) times daily.      guaiFENesin (ROBITUSSIN) 100 MG/5ML SOLN Take 20 mLs by mouth every 4 (four) hours as needed. For cough     ibuprofen (ADVIL,MOTRIN) 200 MG tablet Take 400 mg by mouth every 6 (six) hours as needed. For pain     neomycin-bacitracin-polymyxin (NEOSPORIN) ointment Apply 1 application topically 3 (three) times  daily. Applied to tattoo on back         Hospital Course: 1. History of a.fib. Not on any medication at this time. Rate controlled.  Patient was in sinus during the course of hospital stay.  Would avoid beta blockers in the near future given patient's history of cocaine abuse.  Patient will be on baby aspirin at discharge.  Patient has low risk factors to be started on anticoagulation.  Patient also has alcohol usage as a result will not be a good candidate for Coumadin regardless.  2. Polysubstance abuse.  Patient was evaluated by psychiatry and he expressed interest in alcohol cessation.  With the help of social worker and psychiatry will arrange for outpatient mental health followup.  3. Tobacco use. Counseled on cessation.   4. Chest pain. Ruled out for acute coronary syndrome. Troponins negative. Suspect is likely due to cough.   5. Cough. On azithromycin for bronchitis.  Will complete a five-day course of azithromycin, prescription for azithromycin given at discharge.  6. Transaminitis. Likely due to alcohol use and history of hepatitis C.   7. Alcohol abuse. Currently on CIWA protocol. Appreciate Child psychotherapist and psychiatry input, management as indicated above.  Thiamine and folate given at discharge.   Day of Discharge BP 132/87  Pulse 65  Temp(Src) 97.3 F (36.3 C) (Oral)  Resp 18  Ht 5\' 8"  (1.727 m)  Wt 78.1 kg (172 lb 2.9 oz)  BMI 26.18 kg/m2  SpO2 98%  No  results found for this or any previous visit (from the past 48 hour(s)).  Dg Chest 2 View  01/22/2011  *RADIOLOGY REPORT*  Clinical Data: Cough and chest pain.  CHEST - 2 VIEW  Comparison: Chest x-ray 12/14/2010.  Findings: The cardiac silhouette, mediastinal and hilar contours are within normal limits and stable.  The lungs are clear.  No pleural effusion.  The bony thorax is intact.  IMPRESSION: No acute cardiopulmonary findings.  Original Report Authenticated By: P. Loralie Champagne, M.D.     Disposition: Home  with outpatient followup to mental health clinic to be arranged by patient.  Patient declined Child psychotherapist helping arrange for the followup appointment.  Diet: Heart healthy.  Activity: Resume as tolerate.   Follow-up Appts: Discharge Orders    Future Orders Please Complete By Expires   Diet - low sodium heart healthy      Increase activity slowly      Discharge instructions      Comments:   Followup with outpatient mental health clinic. Please call to arrange for followup. Take librium daily at bedtime for sleep for 4 days then discontinue.      TESTS THAT NEED FOLLOW-UP None  Time spent on discharge, talking to the patient, and coordinating care: 25 mins.   Signed: Cristal Ford, MD 01/25/2011, 12:35 PM

## 2011-02-18 ENCOUNTER — Encounter: Payer: Self-pay | Admitting: Internal Medicine

## 2011-04-17 ENCOUNTER — Other Ambulatory Visit: Payer: Self-pay

## 2011-04-17 ENCOUNTER — Encounter (HOSPITAL_COMMUNITY): Payer: Self-pay | Admitting: Emergency Medicine

## 2011-04-17 ENCOUNTER — Emergency Department (HOSPITAL_COMMUNITY)
Admission: EM | Admit: 2011-04-17 | Discharge: 2011-04-19 | Disposition: A | Discharge status: Other Acute Inpatient Hospital | Payer: Self-pay | Attending: Emergency Medicine | Admitting: Emergency Medicine

## 2011-04-17 DIAGNOSIS — Z7982 Long term (current) use of aspirin: Secondary | ICD-10-CM | POA: Insufficient documentation

## 2011-04-17 DIAGNOSIS — F1010 Alcohol abuse, uncomplicated: Secondary | ICD-10-CM | POA: Insufficient documentation

## 2011-04-17 DIAGNOSIS — R10816 Epigastric abdominal tenderness: Secondary | ICD-10-CM | POA: Insufficient documentation

## 2011-04-17 DIAGNOSIS — R079 Chest pain, unspecified: Secondary | ICD-10-CM | POA: Insufficient documentation

## 2011-04-17 DIAGNOSIS — F17200 Nicotine dependence, unspecified, uncomplicated: Secondary | ICD-10-CM | POA: Insufficient documentation

## 2011-04-17 DIAGNOSIS — I4891 Unspecified atrial fibrillation: Secondary | ICD-10-CM | POA: Insufficient documentation

## 2011-04-17 DIAGNOSIS — R109 Unspecified abdominal pain: Secondary | ICD-10-CM | POA: Insufficient documentation

## 2011-04-17 HISTORY — DX: Alcohol abuse, uncomplicated: F10.10

## 2011-04-17 LAB — COMPREHENSIVE METABOLIC PANEL
AST: 30 U/L (ref 0–37)
Albumin: 3.7 g/dL (ref 3.5–5.2)
Alkaline Phosphatase: 76 U/L (ref 39–117)
BUN: 9 mg/dL (ref 6–23)
Chloride: 107 mEq/L (ref 96–112)
Potassium: 4.1 mEq/L (ref 3.5–5.1)
Sodium: 143 mEq/L (ref 135–145)
Total Protein: 8 g/dL (ref 6.0–8.3)

## 2011-04-17 LAB — CBC
HCT: 47.9 % (ref 39.0–52.0)
MCHC: 35.1 g/dL (ref 30.0–36.0)
Platelets: 229 10*3/uL (ref 150–400)
RDW: 14.1 % (ref 11.5–15.5)
WBC: 6.1 10*3/uL (ref 4.0–10.5)

## 2011-04-17 LAB — URINALYSIS, ROUTINE W REFLEX MICROSCOPIC
Glucose, UA: NEGATIVE mg/dL
Hgb urine dipstick: NEGATIVE
Ketones, ur: NEGATIVE mg/dL
Protein, ur: NEGATIVE mg/dL
Urobilinogen, UA: 1 mg/dL (ref 0.0–1.0)

## 2011-04-17 LAB — RAPID URINE DRUG SCREEN, HOSP PERFORMED
Amphetamines: NOT DETECTED
Benzodiazepines: NOT DETECTED
Tetrahydrocannabinol: NOT DETECTED

## 2011-04-17 LAB — ETHANOL: Alcohol, Ethyl (B): 322 mg/dL — ABNORMAL HIGH (ref 0–11)

## 2011-04-17 LAB — URINE MICROSCOPIC-ADD ON

## 2011-04-17 MED ORDER — LORAZEPAM 1 MG PO TABS
2.0000 mg | ORAL_TABLET | Freq: Once | ORAL | Status: AC
  Administered 2011-04-17: 2 mg via ORAL
  Filled 2011-04-17: qty 2

## 2011-04-17 MED ORDER — ONDANSETRON HCL 8 MG PO TABS
4.0000 mg | ORAL_TABLET | Freq: Three times a day (TID) | ORAL | Status: DC | PRN
  Administered 2011-04-18: 4 mg via ORAL
  Filled 2011-04-17: qty 1
  Filled 2011-04-17: qty 2

## 2011-04-17 MED ORDER — NICOTINE 21 MG/24HR TD PT24
21.0000 mg | MEDICATED_PATCH | Freq: Every day | TRANSDERMAL | Status: DC
  Administered 2011-04-17 – 2011-04-19 (×2): 21 mg via TRANSDERMAL
  Filled 2011-04-17 (×2): qty 1

## 2011-04-17 MED ORDER — IBUPROFEN 200 MG PO TABS
600.0000 mg | ORAL_TABLET | Freq: Three times a day (TID) | ORAL | Status: DC | PRN
  Administered 2011-04-18 – 2011-04-19 (×3): 600 mg via ORAL
  Filled 2011-04-17 (×3): qty 3

## 2011-04-17 MED ORDER — LORAZEPAM 1 MG PO TABS
1.0000 mg | ORAL_TABLET | ORAL | Status: DC | PRN
  Administered 2011-04-18 – 2011-04-19 (×3): 1 mg via ORAL
  Filled 2011-04-17 (×3): qty 1

## 2011-04-17 MED ORDER — ASPIRIN EC 81 MG PO TBEC
81.0000 mg | DELAYED_RELEASE_TABLET | Freq: Every day | ORAL | Status: DC
  Administered 2011-04-17 – 2011-04-19 (×3): 81 mg via ORAL
  Filled 2011-04-17 (×3): qty 1

## 2011-04-17 NOTE — ED Notes (Signed)
Patient wanting detox from alcohol; patient was referred here by family services for detox.  Patient had staring seizure an hour ago; last alcoholic drink around an hour ago.
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
## 2011-04-17 NOTE — ED Notes (Signed)
Patient states have been drinking for years 5th of vodka almost every day last drank today along with using cocaine one day ago.  Patient calm cooperative sent year for placement for long term treatment.  States last detox was 4-5 months ago. Airway intact bilateral equal chest rise and fall.

## 2011-04-17 NOTE — ED Provider Notes (Signed)
History     CSN: [id number]  Arrival date & time 04/17/11  1642   First MD Initiated Contact with Patient 04/17/11 1731      Chief Complaint  Patient presents with  . Medical Clearance     Patient is a 41 y.o. male presenting with alcohol problem. The history is provided by the patient.  Alcohol Problem This is a chronic problem. The current episode started more than 1 week ago. The problem occurs daily. The problem has been gradually worsening. Associated symptoms include chest pain and abdominal pain. The symptoms are aggravated by nothing. The symptoms are relieved by nothing.  nothing improves his symptoms Nothing worsens his symptoms  PT presents for ETOH detox.  He reports 20 yr h/o ETOH abuse.  He reports that his last drink was over 10 hrs ago. He reports he was told he had a seizure today.  He reports h/o seizures.   He also reports abdominal pain and also chest pain, but he reports chest pain is mostly when smoking cigarettes   He has h/o atrial fibrillation but not currently on therapy He admits to recent cocaine use He also reports he is on house arrest and has ankle bracelet Past Medical History  Diagnosis Date  . Hepatitis C   . Chronic back pain   . A-fib   . Depression   . Anxiety   . Hepatitis C     history of  . Chronic back pain   . Acid reflux   . History of urinary frequency   . Peripheral neuropathy     hands and feet  . Hypertension   . Polysubstance abuse 01/25/2011  . Alcohol abuse     Past Surgical History  Procedure Date  . Cardioversion 03/07/2006    History reviewed. No pertinent family history.  History  Substance Use Topics  . Smoking status: Current Everyday Smoker -- 1.0 packs/day for 25 years    Types: Cigarettes  . Smokeless tobacco: Never Used  . Alcohol Use: 14.4 oz/week    24 Cans of beer per week      Review of Systems  Cardiovascular: Positive for chest pain.  Gastrointestinal: Positive for abdominal pain.  All  other systems reviewed and are negative.    Allergies  Codeine; Hydrocodone; and Percocet  Home Medications   Current Outpatient Rx  Name Route Sig Dispense Refill  . ASPIRIN EC 81 MG PO TBEC Oral Take 81 mg by mouth daily.      BP 117/76  Pulse 111  Temp(Src) 98.2 F (36.8 C) (Oral)  Resp 16  SpO2 98%  Physical Exam CONSTITUTIONAL: Well developed/well nourished, pt sleeping, no distress, easily arousable HEAD AND FACE: Normocephalic/atraumatic EYES: EOMI/PERRL ENMT: Mucous membranes moist, no oral trauma noted, no tongue laceration noted NECK: supple no meningeal signs SPINE:entire spine nontender, No bruising/crepitance/stepoffs noted to spine CV: S1/S2 noted, no murmurs/rubs/gallops noted LUNGS: Lungs are clear to auscultation bilaterally, no apparent distress ABDOMEN: soft, epigastric tenderness that is mild, no rebound or guarding GU:no cva tenderness NEURO: Pt is awake/alert, moves all extremitiesx4.  No tremor EXTREMITIES: pulses normal, full ROM.  Ankle bracelet noted.  No signs of trauma SKIN: warm, color normal PSYCH: no abnormalities of mood noted  ED Course  Procedures   Labs Reviewed  COMPREHENSIVE METABOLIC PANEL - Abnormal; Notable for the following:    Glucose, Bld 110 (*)    All other components within normal limits  ETHANOL - Abnormal; Notable for the following:  Alcohol, Ethyl (B) 322 (*)    All other components within normal limits  URINE RAPID DRUG SCREEN (HOSP PERFORMED) - Abnormal; Notable for the following:    Cocaine POSITIVE (*)    All other components within normal limits  URINALYSIS, ROUTINE W REFLEX MICROSCOPIC - Abnormal; Notable for the following:    Leukocytes, UA MODERATE (*)    All other components within normal limits  CBC  URINE MICROSCOPIC-ADD ON   6:42 PM Pt with long h/o ETOH use.  He reports last drink was this AM.  He reports seizure, but none available on my exam to corroborate this.  He was admitted previously for  withdrawal and seizure.  He also reports chest pain with smoking cigarettes, no CP at this time  7:29 PM D/w ACT Will see patient If he sobers overnight and no signs of withdrawal, may be amenable to outpatient management    MDM  Nursing notes reviewed and considered in documentation All labs/vitals reviewed and considered Previous records reviewed and considered        Date: 04/17/2011  Rate: 88  Rhythm: normal sinus rhythm  QRS Axis: normal  Intervals: normal  ST/T Wave abnormalities: nonspecific ST changes  Conduction Disutrbances:none     Joya Gaskins, MD 04/17/11 1930
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
## 2011-04-18 NOTE — ED Notes (Signed)
Ordered food tray for patient. Patient verbalized understanding. Resting comfortably on stretcher. Ax4.

## 2011-04-18 NOTE — ED Notes (Signed)
Patient accepted to [facility] has to be on cardiac monitor for 24 hours.

## 2011-04-18 NOTE — ED Notes (Signed)
Called house coverage able to take patient to 4100 for a shower.  Called Security to assist.

## 2011-04-18 NOTE — ED Notes (Signed)
Patient awake watching TV. Ax4 calm cooperative. Stated when asked if patient wants to shower.  Will eat first then shower. Food tray given to patient.
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
## 2011-04-18 NOTE — ED Notes (Signed)
Patient took shower without incident.

## 2011-04-18 NOTE — ED Provider Notes (Signed)
Discussed with ACT. Patient will be poorly run at behavioral health again this morning.  Juliet Rude. Rubin Payor, MD 04/18/11 [phone]

## 2011-04-18 NOTE — ED Notes (Signed)
Patient resting comfortably on stretcher. Ax4 Bed locked on lowest position. Call light in place.

## 2011-04-18 NOTE — ED Notes (Signed)
Food tray ordered

## 2011-04-19 ENCOUNTER — Inpatient Hospital Stay (HOSPITAL_COMMUNITY)
Admission: AD | Admit: 2011-04-19 | Discharge: 2011-04-23 | DRG: 897 | Disposition: A | Discharge status: Home / Self Care | Payer: PRIVATE HEALTH INSURANCE | Source: Ambulatory Visit | Attending: Psychiatry | Admitting: Psychiatry

## 2011-04-19 DIAGNOSIS — F411 Generalized anxiety disorder: Secondary | ICD-10-CM

## 2011-04-19 DIAGNOSIS — Z560 Unemployment, unspecified: Secondary | ICD-10-CM

## 2011-04-19 DIAGNOSIS — I4891 Unspecified atrial fibrillation: Secondary | ICD-10-CM

## 2011-04-19 DIAGNOSIS — F3289 Other specified depressive episodes: Secondary | ICD-10-CM

## 2011-04-19 DIAGNOSIS — Z7982 Long term (current) use of aspirin: Secondary | ICD-10-CM

## 2011-04-19 DIAGNOSIS — Z653 Problems related to other legal circumstances: Secondary | ICD-10-CM

## 2011-04-19 DIAGNOSIS — G8929 Other chronic pain: Secondary | ICD-10-CM

## 2011-04-19 DIAGNOSIS — Z6379 Other stressful life events affecting family and household: Secondary | ICD-10-CM

## 2011-04-19 DIAGNOSIS — F329 Major depressive disorder, single episode, unspecified: Secondary | ICD-10-CM

## 2011-04-19 DIAGNOSIS — Z888 Allergy status to other drugs, medicaments and biological substances status: Secondary | ICD-10-CM

## 2011-04-19 DIAGNOSIS — M549 Dorsalgia, unspecified: Secondary | ICD-10-CM

## 2011-04-19 DIAGNOSIS — B1920 Unspecified viral hepatitis C without hepatic coma: Secondary | ICD-10-CM

## 2011-04-19 DIAGNOSIS — J219 Acute bronchiolitis, unspecified: Secondary | ICD-10-CM

## 2011-04-19 DIAGNOSIS — F1020 Alcohol dependence, uncomplicated: Principal | ICD-10-CM

## 2011-04-19 DIAGNOSIS — K219 Gastro-esophageal reflux disease without esophagitis: Secondary | ICD-10-CM

## 2011-04-19 DIAGNOSIS — F17200 Nicotine dependence, unspecified, uncomplicated: Secondary | ICD-10-CM

## 2011-04-19 DIAGNOSIS — G609 Hereditary and idiopathic neuropathy, unspecified: Secondary | ICD-10-CM

## 2011-04-19 DIAGNOSIS — F1910 Other psychoactive substance abuse, uncomplicated: Secondary | ICD-10-CM | POA: Diagnosis present

## 2011-04-19 DIAGNOSIS — F1010 Alcohol abuse, uncomplicated: Secondary | ICD-10-CM

## 2011-04-19 MED ORDER — ACETAMINOPHEN 325 MG PO TABS
650.0000 mg | ORAL_TABLET | Freq: Four times a day (QID) | ORAL | Status: DC | PRN
  Administered 2011-04-19 – 2011-04-21 (×4): 650 mg via ORAL

## 2011-04-19 MED ORDER — CHLORDIAZEPOXIDE HCL 25 MG PO CAPS: 25.0000 mg | ORAL_CAPSULE | Freq: Every day | ORAL | Status: DC

## 2011-04-19 MED ORDER — ASPIRIN 81 MG PO CHEW
81.0000 mg | CHEWABLE_TABLET | Freq: Every day | ORAL | Status: DC
  Administered 2011-04-20 – 2011-04-23 (×4): 81 mg via ORAL
  Filled 2011-04-19 (×6): qty 1

## 2011-04-19 MED ORDER — MAGNESIUM HYDROXIDE 400 MG/5ML PO SUSP: 30.0000 mL | Freq: Every day | ORAL | Status: DC | PRN

## 2011-04-19 MED ORDER — CHLORDIAZEPOXIDE HCL 25 MG PO CAPS
25.0000 mg | ORAL_CAPSULE | ORAL | Status: AC
  Administered 2011-04-22 – 2011-04-23 (×2): 25 mg via ORAL
  Filled 2011-04-19 (×2): qty 1

## 2011-04-19 MED ORDER — CHLORDIAZEPOXIDE HCL 25 MG PO CAPS
50.0000 mg | ORAL_CAPSULE | Freq: Once | ORAL | Status: AC
  Administered 2011-04-19: 50 mg via ORAL
  Filled 2011-04-19: qty 2

## 2011-04-19 MED ORDER — THIAMINE HCL 100 MG/ML IJ SOLN: 100.0000 mg | Freq: Once | INTRAMUSCULAR | Status: DC

## 2011-04-19 MED ORDER — ALUM & MAG HYDROXIDE-SIMETH 200-200-20 MG/5ML PO SUSP
30.0000 mL | ORAL | Status: DC | PRN
  Administered 2011-04-21: 30 mL via ORAL

## 2011-04-19 MED ORDER — CHLORDIAZEPOXIDE HCL 25 MG PO CAPS
25.0000 mg | ORAL_CAPSULE | Freq: Four times a day (QID) | ORAL | Status: AC
  Administered 2011-04-19 – 2011-04-21 (×6): 25 mg via ORAL
  Filled 2011-04-19 (×6): qty 1

## 2011-04-19 MED ORDER — CHLORDIAZEPOXIDE HCL 25 MG PO CAPS
25.0000 mg | ORAL_CAPSULE | Freq: Three times a day (TID) | ORAL | Status: AC
  Administered 2011-04-21 – 2011-04-22 (×3): 25 mg via ORAL
  Filled 2011-04-19 (×3): qty 1

## 2011-04-19 MED ORDER — HYDROXYZINE HCL 25 MG PO TABS
25.0000 mg | ORAL_TABLET | Freq: Four times a day (QID) | ORAL | Status: AC | PRN
  Administered 2011-04-19: 25 mg via ORAL

## 2011-04-19 MED ORDER — VITAMIN B-1 100 MG PO TABS
100.0000 mg | ORAL_TABLET | Freq: Every day | ORAL | Status: DC
Start: 2011-04-20 — End: 2011-04-23
  Administered 2011-04-20 – 2011-04-23 (×4): 100 mg via ORAL
  Filled 2011-04-19 (×6): qty 1

## 2011-04-19 MED ORDER — ADULT MULTIVITAMIN W/MINERALS CH
1.0000 | ORAL_TABLET | Freq: Every day | ORAL | Status: DC
  Administered 2011-04-20 – 2011-04-23 (×4): 1 via ORAL
  Filled 2011-04-19 (×6): qty 1

## 2011-04-19 MED ORDER — ONDANSETRON 4 MG PO TBDP: 4.0000 mg | ORAL_TABLET | Freq: Four times a day (QID) | ORAL | Status: AC | PRN

## 2011-04-19 MED ORDER — NICOTINE 21 MG/24HR TD PT24
21.0000 mg | MEDICATED_PATCH | Freq: Every day | TRANSDERMAL | Status: DC
  Administered 2011-04-20 – 2011-04-23 (×4): 21 mg via TRANSDERMAL
  Filled 2011-04-19 (×7): qty 1

## 2011-04-19 MED ORDER — HYDROXYZINE HCL 50 MG PO TABS
50.0000 mg | ORAL_TABLET | Freq: Every evening | ORAL | Status: DC | PRN
  Administered 2011-04-20 – 2011-04-23 (×3): 50 mg via ORAL

## 2011-04-19 MED ORDER — CHLORDIAZEPOXIDE HCL 25 MG PO CAPS
25.0000 mg | ORAL_CAPSULE | Freq: Four times a day (QID) | ORAL | Status: AC | PRN
  Administered 2011-04-20 – 2011-04-21 (×2): 25 mg via ORAL
  Filled 2011-04-19: qty 1

## 2011-04-19 MED ORDER — LOPERAMIDE HCL 2 MG PO CAPS
2.0000 mg | ORAL_CAPSULE | ORAL | Status: AC | PRN
  Administered 2011-04-22: 4 mg via ORAL
  Administered 2011-04-22: 2 mg via ORAL

## 2011-04-19 NOTE — Progress Notes (Signed)
Pt is a 41 year old Caucasion male admitted to the care of Dr. Koren Shiver for detox from ETOH.  Pt reports drinking at least a fifth of liquor daily as well as beer.  He occasionally uses cocaine.  Pt came into the ED after having a "staring seizure".  Pt reports that he has had these before and that sometimes they are worse than the last one he had.  Pt has a history of Hep C, Afib, GERD.  He was cooperative on admission with minimal signs of withdrawal at present.  Pt appears motivated at this time for treatment, he has been experiencing blackouts and this has really began to concern him.  Pt lists list mother, Blake Arnold, as his emergency contact person [phone]

## 2011-04-19 NOTE — Tx Team (Signed)
Initial Interdisciplinary Treatment Plan  PATIENT STRENGTHS: (choose at least two) Ability for insight Active sense of humor Average or above average intelligence General fund of knowledge Motivation for treatment/growth Supportive family/friends  PATIENT STRESSORS: Financial difficulties Health problems Legal issue Marital or family conflict Substance abuse   PROBLEM LIST: Problem List/Patient Goals Date to be addressed Date deferred Reason deferred Estimated date of resolution  Substance abuse (ETOH) 04/19/11                                                      DISCHARGE CRITERIA:  Improved stabilization in mood, thinking, and/or behavior Need for constant or close observation no longer present Verbal commitment to aftercare and medication compliance Withdrawal symptoms are absent or subacute and managed without 24-hour nursing intervention  PRELIMINARY DISCHARGE PLAN: Attend 12-step recovery group Outpatient therapy  PATIENT/FAMIILY INVOLVEMENT: This treatment plan has been presented to and reviewed with the patient, [name].  The patient and family have been given the opportunity to ask questions and make suggestions.  Juliann Pares 04/19/2011, 8:56 PM

## 2011-04-19 NOTE — ED Notes (Signed)
Reg/no sharps b'fast requested

## 2011-04-19 NOTE — BH Assessment (Signed)
Assessment Note   Blake Arnold is an 41 y.o. male. Blake Arnold is an 41 y.o. male that was reassessed this day. Pt presents requesting detox for alcohol. Pt denies SI/HI/psychosis. Per last clinician on shift, pt referral sent to [facility] to run for possible admission. Called [facility] to check on referral status, and per [facility], pt accepted by PA Shelda Jakes if pt has a cardiac monitor on for 24 hrs. Consulted with EDP Jeraldine Loots, who agreed to order cardiac monitor for 24 hrs. Per last clinician on call, pt cannot got detox at any other facility, as he has a house arrest bracelet on his ankle. Writer called [employer] to determine if this was the case, or if pt could be referred to other facilities for detox in the meantime, as pt may be accepted by another facility sooner. [employer] to call back. Completed reassessment, assessment notification and faxed to [facility] to log. Updated ED staff.  Updated:Pt accepted to [facility] .This Writer assigned to Follow up and complete support paperwork and precert. Consulted with EDP Dr.Tucich who is agreeable to pt transfer to [facility]. Pt currently awaiting transfer to [facility].    Axis I: Substance Induced Mood Disorder, Alcohol Dependence Axis II: Deferred Axis III:  Past Medical History  Diagnosis Date  . Hepatitis C   . Chronic back pain   . A-fib   . Depression   . Anxiety   . Hepatitis C     history of  . Chronic back pain   . Acid reflux   . History of urinary frequency   . Peripheral neuropathy     hands and feet  . Hypertension   . Polysubstance abuse 01/25/2011  . Alcohol abuse    Axis IV: other psychosocial or environmental problems, problems related to legal system/crime and problems related to social environment Axis V: 21-30 behavior considerably influenced by delusions or hallucinations OR serious impairment in judgment, communication OR inability to function in almost all areas  Past Medical History:  Past Medical History  Diagnosis Date  . Hepatitis C   .  Chronic back pain   . A-fib   . Depression   . Anxiety   . Hepatitis C     history of  . Chronic back pain   . Acid reflux   . History of urinary frequency   . Peripheral neuropathy     hands and feet  . Hypertension   . Polysubstance abuse 01/25/2011  . Alcohol abuse     Past Surgical History  Procedure Date  . Cardioversion 03/07/2006    Family History: No family history on file.  Social History:  reports that he has been smoking Cigarettes.  He has a 25 pack-year smoking history. He has never used smokeless tobacco. He reports that he drinks about 14.4 ounces of alcohol per week. He reports that he uses illicit drugs (Cocaine and "Crack" cocaine).  Additional Social History:    Allergies:  Allergies  Allergen Reactions  . Codeine Swelling  . Hydrocodone Swelling  . Percocet (Oxycodone-Acetaminophen) Swelling    Home Medications:  Medications Prior to Admission  Medication Dose Route Frequency Provider Last Rate Last Dose  . aspirin EC tablet 81 mg  81 mg Oral Daily Joya Gaskins, MD   81 mg at 04/19/11 1407  . ibuprofen (ADVIL,MOTRIN) tablet 600 mg  600 mg Oral Q8H PRN Joya Gaskins, MD   600 mg at 04/19/11 0853  . LORazepam (ATIVAN) tablet 1 mg  1  mg Oral Q1H PRN Joya Gaskins, MD   1 mg at 04/19/11 0853  . nicotine (NICODERM CQ - dosed in mg/24 hours) patch 21 mg  21 mg Transdermal Daily Joya Gaskins, MD   21 mg at 04/19/11 0856  . ondansetron (ZOFRAN) tablet 4 mg  4 mg Oral Q8H PRN Joya Gaskins, MD   4 mg at 04/18/11 1610   Medications Prior to Admission  Medication Sig Dispense Refill  . aspirin EC 81 MG tablet Take 81 mg by mouth daily.        OB/GYN Status:  No LMP for male patient.  General Assessment Data Location of Assessment: [facility] ED ACT Assessment: Yes                                                   Disposition:  Disposition Disposition of Patient: Inpatient treatment program Type of inpatient  treatment program: Adult Patient referred to: Other (Comment)  On Site Evaluation by:   Reviewed with Physician:     Bjorn Pippin 04/19/2011 4:15 PM

## 2011-04-19 NOTE — ED Notes (Signed)
Pt states that he wants his parole officer to have his number on the chart. [phone]

## 2011-04-19 NOTE — ED Notes (Signed)
Dinner tray delivered.

## 2011-04-19 NOTE — ED Notes (Signed)
Dinner tray ordered, regular nonsharp 

## 2011-04-20 DIAGNOSIS — F1020 Alcohol dependence, uncomplicated: Principal | ICD-10-CM

## 2011-04-20 NOTE — Progress Notes (Signed)
Patient ID: Blake Arnold, male   DOB: [birth year], 41 y.o.   MRN: [id number] Pt. attended and participated in aftercare planning group. Pt. accepted information on suicide prevention, warning signs to look for with suicide and crisis line numbers to use. The pt. agreed to call crisis line numbers if having warning signs or having thoughts of suicide. Pt. listed their current anxiety level as high.  Pt. accepted AA meeting schedule.

## 2011-04-20 NOTE — Progress Notes (Signed)
Pt bright on approach, having some withdrawal symptoms but does not want prn.  Pt pleased that he will be tapering off of the librium, states it makes him feel tired.  Positive for group, denies SI/HI/hallucinations.  No complaints voiced.  Support and encouragement offered, will continue to monitor.

## 2011-04-20 NOTE — Progress Notes (Signed)
[facility] Group Notes:  (Counselor/Nursing/MHT/Case Management/Adjunct)  04/20/2011 1:15 PM  Type of Therapy:  Group Therapy, Dance/Movement Therapy   Participation Level:  Did Not Attend    Clennon Nasca  
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
## 2011-04-20 NOTE — H&P (Signed)
Psychiatric Admission Assessment Adult  Patient Identification:  Blake Arnold Date of Evaluation:  04/20/2011 41 yo SWM has an ankle bracelet L   History of Present Illness: Was drinking and PO brought him to [employer]. Due to needing detox he was sent to [facility] ED. ETOH was 322 and UDS +cocaine.He has had withdrawal seizures recently  And may have had one prior to going to ED. Says first one was 6-8 mos ago when he was admitted to [facility]. Today he wants to get better as his Hep C scares him and he doesn't want to continue to worry his mother.    Past Psychiatric History: Alcohol issues for at least 20 years. Many rehabs including [facility] etc.  Substance Abuse History:  Social History: Went to 10th grade never married has no children. 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 Currently receiving unemployment but it will finish soon.     reports that he has been smoking Cigarettes.  He has a 25 pack-year smoking history. He has never used smokeless tobacco. He reports that he drinks about 14.4 ounces of alcohol per week. He reports that he uses illicit drugs (Cocaine and "Crack" cocaine).  Family Psych History: Most of his brothers have alcohol issues has 7 some are full some are half.   Past Medical History:     Past Medical History  Diagnosis Date  . Hepatitis C   . Chronic back pain   . A-fib   . Depression   . Anxiety   . Hepatitis C     history of  . Chronic back pain   . Acid reflux   . History of urinary frequency   . Peripheral neuropathy     hands and feet  . Hypertension   . Polysubstance abuse 01/25/2011  . Alcohol abuse        Past Surgical History  Procedure Date  . Cardioversion 03/07/2006    Allergies:  Allergies  Allergen Reactions  . Codeine Swelling  . Hydrocodone Swelling  . Percocet (Oxycodone-Acetaminophen) Swelling    Current Medications:  Prior to Admission medications   Medication Sig Start Date End  Date Taking? Authorizing Provider  aspirin EC 81 MG tablet Take 81 mg by mouth daily.    Historical Provider, MD    Mental Status Examination/Evaluation: Objective:  Appearance: Neat  Psychomotor Activity:  Normal  Eye Contact::  Good  Speech:  Normal Rate  Volume:  Normal  Mood: no withdrawal    Affect:  Congruent  Thought Process:clear rational goal oriented     Orientation:  Full  Thought Content:  No AVH or psychosis   Suicidal Thoughts:  No  Homicidal Thoughts:  No  Judgement:  Fair  Insight:  Fair    DIAGNOSIS:    AXIS I alcohol dependence  AXIS II Deferred  AXIS III See medical history.  AXIS IV economic problems, educational problems, housing problems, occupational problems, problems related to legal system/crime and problems with primary support group court date 05/23/11  AXIS V 31-40 impairment in reality testing when drinking      Treatment Plan Summary: Admit for safety & stabilization  Help to withdraw using Librium protocol  Agree with H&P from ED

## 2011-04-20 NOTE — Progress Notes (Signed)
Pt is quiet and withdrawn on unit.  Attending groups with minimal participation.  Rates depression and hopelessness at 5.  Withdrawal symptoms of tremors,chilling,cravings and agitation reported and pt is compliant with scheduled protocol with no prn's requested.  Reports fair sleep, good appetite and low energy. Support and encouragement given and 15' checks cont for safety.
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
## 2011-04-21 DIAGNOSIS — F1910 Other psychoactive substance abuse, uncomplicated: Secondary | ICD-10-CM

## 2011-04-21 NOTE — Progress Notes (Signed)
Pt is out in milieu interacting with peers and attending groups.  Cont to have moderate withdrawal of tremors,chilling,cravings and "ringing in ears". Compliant with scheduled medication protocol and no prn's requested.  Rates depression at 5 and hopelessness at 3.  Denies SI. Good group participation.  Is being proactive with d/c plans and desires long term tx.  Support offered and 15' checks cont for safety.

## 2011-04-21 NOTE — H&P (Signed)
Patient was seen along with the PA and reviewed the chart. Agree with the treatment plan.  

## 2011-04-21 NOTE — Progress Notes (Signed)
  KOHLER PELLERITO is a 41 y.o. male [id number] [birth year]  04/19/2011 Active Problems:  Polysubstance abuse   Mental Status: Sleepy seen in room . Denies SI/HI/AVH .  Subjective/Objective: Declines prns for withdrawal. No major issues with withdrawal.    Filed Vitals:   04/21/11 0813  BP: 127/84  Pulse: 88  Temp: 98.1 F (36.7 C)  Resp: 20    Lab Results:   BMET    Component Value Date/Time   NA 143 04/17/2011 1750   K 4.1 04/17/2011 1750   CL 107 04/17/2011 1750   CO2 25 04/17/2011 1750   GLUCOSE 110* 04/17/2011 1750   BUN 9 04/17/2011 1750   CREATININE 0.70 04/17/2011 1750   CALCIUM 8.8 04/17/2011 1750   GFRNONAA >90 04/17/2011 1750   GFRAA >90 04/17/2011 1750    Medications:  Scheduled:     . aspirin  81 mg Oral Daily  . chlordiazePOXIDE  25 mg Oral QID   Followed by  . chlordiazePOXIDE  25 mg Oral TID   Followed by  . chlordiazePOXIDE  25 mg Oral BH-qamhs   Followed by  . chlordiazePOXIDE  25 mg Oral Daily  . mulitivitamin with minerals  1 tablet Oral Daily  . nicotine  21 mg Transdermal Q0600  . thiamine  100 mg Intramuscular Once  . thiamine  100 mg Oral Daily     PRN Meds acetaminophen, alum & mag hydroxide-simeth, chlordiazePOXIDE, hydrOXYzine, hydrOXYzine, loperamide, magnesium hydroxide, ondansetron  Plan: Work with PO tomorrow about what treatment goals they have in mind. Ankle bracelet limits options.   Hector Taft,MICKIE D. 04/21/2011

## 2011-04-21 NOTE — Progress Notes (Signed)
Pt sitting in room, pleasant on approach.  Complaint of gas, no other complaints voiced.  Pt denies SI/HI/hallucinations.  Denies any acute withdrawal symptoms at this time.  Support and encouragement offered, medication given as ordered, will continue to monitor.

## 2011-04-21 NOTE — Progress Notes (Signed)
Patient ID: Blake Arnold, male   DOB: 1970/07/11, 41 y.o.   MRN: [id number]  [facility] Group Notes:  (Counselor/Nursing/MHT/Case Management/Adjunct)  04/21/2011 1:15 PM  Type of Therapy:  Group Therapy, Dance/Movement Therapy   Participation Level:  Minimal  Participation Quality:  Appropriate  Affect:  Appropriate  Cognitive:  Oriented  Insight:  Limited  Engagement in Group:  Limited  Engagement in Therapy:  Limited  Modes of Intervention:  Clarification, Problem-solving, Role-play, Socialization and Support  Summary of Progress/Problems:Therapist explored with group methods of handling problems by means of establishing a healthy strategy through the use of positive affirmations.  Patient stated that they will utilize the positive affirmation of "You're as sick as your secrets".                Rhunette Croft

## 2011-04-22 DIAGNOSIS — Z653 Problems related to other legal circumstances: Secondary | ICD-10-CM

## 2011-04-22 NOTE — Progress Notes (Signed)
Pt. Pleasant and cooperative.   Denies SI/HI and denies A/V hallucinations.  Pt. Focused on going to a long term treatment facility.  Encouragement and support given.

## 2011-04-22 NOTE — Progress Notes (Signed)
[facility] MD Progress Note  04/22/2011 3:26 PM  Diagnosis:  Axis I: Alcohol Abuse  ADL's:  Intact  Sleep: Fair  Appetite:  Fair  Suicidal Ideation:  denies Homicidal Ideation:  denies  AEB (as evidenced by): Subjective: Pt. States he would like to be discharged today or tomorrow sighting that he has no withdrawal symptoms currently.  He also states he has an appointment with his Case worker [name]. At [phone].  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  He reports his only symptoms are of sedation from the detox medication. Mental Status Examination/Evaluation: Objective:  Appearance: Disheveled  Eye Contact::  Good  Speech:  Clear and Coherent  Volume:  Normal  Mood:  Anxious  Affect:  Appropriate  Thought Process:  Goal Directed  Orientation:  Full  Thought Content:  WDL  Suicidal Thoughts:  No  Homicidal Thoughts:  No  Memory:  Immediate;   Fair  Judgement:  Poor  Insight:  Lacking  Psychomotor Activity:  Normal  Concentration:  Fair  Recall:  Fair  Akathisia:  No  Handed:    AIMS (if indicated):     Assets:  Desire for Improvement  Sleep:  Number of Hours: 6    Vital Signs:Blood pressure 115/78, pulse 77, temperature 97.4 F (36.3 C), temperature source Oral, resp. rate 16, height 5\' 8"  (1.727 m), weight 80.74 kg (178 lb). Current Medications: Current Facility-Administered Medications  Medication Dose Route Frequency Provider Last Rate Last Dose  . acetaminophen (TYLENOL) tablet 650 mg  650 mg Oral Q6H PRN Nehemiah Settle, MD   650 mg at 04/21/11 2129  . alum & mag hydroxide-simeth (MAALOX/MYLANTA) 200-200-20 MG/5ML suspension 30 mL  30 mL Oral Q4H PRN Nehemiah Settle, MD   30 mL at 04/21/11 1951  . aspirin chewable tablet 81 mg  81 mg Oral Daily Nehemiah Settle, MD   81 mg at 04/22/11 0831  . chlordiazePOXIDE (LIBRIUM) capsule 25 mg  25 mg Oral Q6H PRN Nehemiah Settle, MD   25 mg  at 04/21/11 2130  . chlordiazePOXIDE (LIBRIUM) capsule 25 mg  25 mg Oral TID Nehemiah Settle, MD   25 mg at 04/22/11 0831   Followed by  . chlordiazePOXIDE (LIBRIUM) capsule 25 mg  25 mg Oral BH-qamhs Nehemiah Settle, MD       Followed by  . chlordiazePOXIDE (LIBRIUM) capsule 25 mg  25 mg Oral Daily Nehemiah Settle, MD      . hydrOXYzine (ATARAX/VISTARIL) tablet 25 mg  25 mg Oral Q6H PRN Nehemiah Settle, MD   25 mg at 04/19/11 2140  . hydrOXYzine (ATARAX/VISTARIL) tablet 50 mg  50 mg Oral QHS PRN Nehemiah Settle, MD   50 mg at 04/21/11 2129  . loperamide (IMODIUM) capsule 2-4 mg  2-4 mg Oral PRN Nehemiah Settle, MD   2 mg at 04/22/11 0833  . magnesium hydroxide (MILK OF MAGNESIA) suspension 30 mL  30 mL Oral Daily PRN Nehemiah Settle, MD      . mulitivitamin with minerals tablet 1 tablet  1 tablet Oral Daily Nehemiah Settle, MD   1 tablet at 04/22/11 0831  . nicotine (NICODERM CQ - dosed in mg/24 hours) patch 21 mg  21 mg Transdermal Q0600 Nehemiah Settle, MD   21 mg at 04/22/11 0602  . ondansetron (ZOFRAN-ODT) disintegrating tablet 4 mg  4 mg Oral Q6H PRN Randal Buba  Jonnalagadda, MD      . thiamine (B-1) injection 100 mg  100 mg Intramuscular Once Nehemiah Settle, MD      . thiamine (VITAMIN B-1) tablet 100 mg  100 mg Oral Daily Nehemiah Settle, MD   100 mg at 04/22/11 0831    Lab Results: No results found for this or any previous visit (from the past 48 hour(s)).   CIWA:  CIWA-Ar Total: 1  COWS:        Plan:  Blake Arnold 04/22/2011, 3:26 PM

## 2011-04-22 NOTE — Progress Notes (Signed)
[facility] Group Notes:  (Counselor/Nursing/MHT/Case Management/Adjunct)  04/22/2011 1:07 PM  Type of Therapy:  Group Therapy  Participation Level:  Minimal  Participation Quality:  Appropriate, Attentive and Sharing  Affect:  Depressed  Cognitive:  Alert and Oriented  Insight:  Limited  Engagement in Group:  Limited  Engagement in Therapy:  Limited  Modes of Intervention:  Clarification, Socialization and Support  Summary of Progress/Problems:  Blake Arnold shared his biggest obstacles to recovery historically have included habits, negative thinking and emotional relationships (ie, girlfriend). Blake Arnold shared also that not going to meetings has been pointed out by others to play a role in relapse.  Patient was attentive to entire discussion   Blake Arnold 04/22/2011,   Blake Arnold Group Notes:  (Counselor/Nursing/MHT/Case Management/Adjunct)  04/22/2011 1:40 PM  Type of Therapy:  Group Therapy  Participation Level: Active  Participation Quality:  Appropriate and Sharing  Affect:  Appropriate  Cognitive:  Alert and Appropriate  Insight:  Good  Engagement in Group:  Good  Modes of Intervention:  Education, Socialization and Support  Summary of Progress/Problems:  Counselor facilitated group usually presented by volunteer from [employer] ([employer] [place]).  Discussion included multiple classes and support groups offered by [facility].  [name] shared that he had in fact used the Mobile Crisis Management Service before and was meet by patient and professional people at his home who were able to get him into treatment.     Blake Arnold 04/22/2011, 1:46 PM
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
## 2011-04-23 NOTE — Progress Notes (Signed)
04/23/2011         Time: 1415      Group Topic/Focus: The focus of this group is on discussing various styles of communication and communicating assertively using 'I' (feeling) statements.  Participation Level: Active  Participation Quality: Appropriate, Attentive  Affect: Blunted  Cognitive: Oriented   Additional Comments: Patient reports looking forward to discharge today.    Velora Horstman 04/23/2011 4:13 PM

## 2011-04-23 NOTE — Treatment Plan (Signed)
Interdisciplinary Treatment Plan Update (Adult)  Date: 04/23/2011  Time Reviewed: 7:53 AM   Progress in Treatment: Attending groups: Yes Participating in groups: Yes Taking medication as prescribed: Yes Tolerating medication: Yes   Family/Significant other contact made:  None identified Patient understands diagnosis:  Yes  As evidenced by asking for help with detox Discussing patient identified problems/goals with staff:  Yes  See below Medical problems stabilized or resolved:  Yes Denies suicidal/homicidal ideation: Yes  In tx team Issues/concerns per patient self-inventory:  None noted  Requests d/c Other:  New problem(s) identified: N/A  Reason for Continuation of Hospitalization: Other; describe D/C today  Interventions implemented related to continuation of hospitalization:  None  Additional comments:  Estimated length of stay:  D/C today  Discharge Plan: Return home, follow up Family Services  New goal(s): N/A  Review of initial/current patient goals per problem list:   1.  Goal(s):Safely detox from alcohol  Met:  Yes  Target date:3/12  As evidenced ZH:YQMVHQIO in CIWA to 0, stable vitals  2.  Goal (s):Iddentify comprehensive  Met:  Yes  Target date:3/12  As evidenced NG:EXBM report  3.  Goal(s):  Met:  Yes  Target date:  As evidenced by:  4.  Goal(s):  Met:  Yes  Target date:  As evidenced by:  Attendees: Patient:  Blake Arnold 04/23/2011 7:53 AM  Family:     Physician:  Lupe Carney 04/23/2011 7:53 AM   Nursing: Roswell Miners   04/23/2011 7:53 AM   Case Manager:  Richelle Ito, LCSW 04/23/2011 7:53 AM   Counselor:  Ronda Fairly, LCSWA 04/23/2011 7:53 AM   Other:     Other:     Other:     Other:      Scribe for Treatment Team:   Ida Rogue, 04/23/2011 7:53 AM  r

## 2011-04-23 NOTE — Progress Notes (Signed)
Pt requesting med to help him get back to sleep.  His roommate is having a difficult detox and is restless in the room, so it woke up pt.  Pt denies any other concerns or discomfort at this time.  Safety maintained with q15 minute checks.

## 2011-04-23 NOTE — Progress Notes (Signed)
Patient ID: Blake Arnold, male   DOB: 1971-02-03, 41 y.o.   MRN: [id number] He has been up and about and to groups, interacting with peers and staff. He is going to be discharged this afternoon.   Has been pleasant and cooperative.

## 2011-04-23 NOTE — Progress Notes (Signed)
Pt. Denies SI,HI, & AVH. D/C instructions given. Pt. D/c,d home with mother. Belongings returned. Pt. Verbalized understanding of all d/c instructions.

## 2011-04-23 NOTE — Progress Notes (Signed)
[facility] Case Management Discharge Plan:  Will you be returning to the same living situation after discharge: Yes,  home At discharge, do you have transportation home?:Yes,  bus pass Do you have the ability to pay for your medications:Yes,  no meds  Interagency Information:     Release of information consent forms completed and in the chart;  Patient's signature needed at discharge.  Patient to Follow up at:  Follow-up Information    Follow up with [facility] on 04/23/2011. (Today for whatever time you rescheduled for)    Contact information:   [address] [phone]         Patient denies SI/HI:   Yes,  yes    Safety Planning and Suicide Prevention discussed:  Yes,  yes  Barrier to discharge identified:No.  Summary and Recommendations:   Blake Arnold 04/23/2011, 11:39 AM
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
## 2011-04-25 NOTE — Discharge Summary (Signed)
Physician Discharge Summary Note  Patient:  Blake Arnold is an 41 y.o., male MRN:  [id number] DOB:  [birth year] Patient phone:  [phone] (home)  Patient address:   [address]  [place] [address],   Date of Admission:  04/19/2011 Date of Discharge: 04/24/2011  Reason for Admission: Detox Discharge Diagnoses: Active Problems:  Legal problem  Polysubstance abuse   Axis Diagnosis:   AXIS I: Alcohol Dependence; Cocaine Abuse; HepC; Hx Afib; Hx w/d seizures   AXIS II:  deferred AXIS III:   Past Medical History  Diagnosis Date  . Hepatitis C   . Chronic back pain   . A-fib   . Depression   . Anxiety   . Hepatitis C     history of  . Chronic back pain   . Acid reflux   . History of urinary frequency   . Peripheral neuropathy     hands and feet  . Hypertension   . Polysubstance abuse 01/25/2011  . Alcohol abuse    AXIS IV: problems with primary support  AXIS V:  GAF: 55 Level of Care:  Outpatient Hospital Course:  The patient was admitted for stabilization and crisis management.  He was seen by MD, PAC, RN, Social worker, Case Manager.  His physical problems were identified and treated appropriately.  Emilio had an uncomplicated detox and did well with the medication.  He worked with a Surveyor, quantity to facilitate his release from [facility]. Jameil wanted to have residential rehab when he was discharged but due to his current charges and his ankle bracelet for pending charges, he ws not accepted into any facility.  He responded well to medication, crisis management, and supportive therapy. After meeting with the treatment team Robbi felt that he would be better served if he went before the judge in 3 days and asked for the monitor to be removed.  He would reapply after his monitor was removed and showed no evidence that discharging him would not be a problem. The patient was seen by the treatment team and felt to be readily able to be comfortable without  medications at this time. Consults:  none Significant Diagnostic Studies:  none Discharge Vitals:   Blood pressure 135/89, pulse 88, temperature 96.4 F (35.8 C), temperature source Oral, resp. rate 18, height 5\' 8"  (1.727 m), weight 80.74 kg (178 lb). Mental Status Exam: See Mental Status Examination and Suicide Risk Assessment completed by Attending Physician prior to discharge.  Discharge destination:  Home Is patient on multiple antipsychotic therapies at discharge:  no   Has Patient had three or more failed trials of antipsychotic monotherapy by history:  no Recommended Plan for Multiple Antipsychotic Therapies:none  Discharge Orders    Future Orders Please Complete By Expires   Diet - low sodium heart healthy      Increase activity slowly      Discharge instructions      Comments:   Keep all scheduled appointments as planned to get your medications.     Medication List  As of 04/25/2011 11:28 PM   TAKE these medications      Indication    aspirin EC 81 MG tablet   Take 81 mg by mouth daily.            Follow-up Information    Follow up with [facility] on 04/23/2011. (Today for whatever time you rescheduled for)    Contact information:   [address] [phone]         Follow-up recommendations:  Pt. Is encouraged to keep in touch with his  Engineer, drilling and to follow up at his regular health care provider as documented. Comments: none  Signed: [name] T. Cortlandt Capuano PAC For Dr. Nadara Mode Kuroski=Mazzei 04/25/2011, 11:28 PM

## 2011-04-26 NOTE — Progress Notes (Signed)
Patient Discharge Instructions:  Psychiatric Admission Assessment Note Provided,  04/26/2011 Discharge Summary Note Provided,   04/26/2011 After Visit Summary (AVS) Provided,  04/26/2011 Face Sheet Provided, 04/26/2011 Faxed/Sent to the Next Level Care provider:  04/26/2011  Faxed to [facility] @ [phone]  Wandra Scot, 04/26/2011, 12:35 PM

## 2011-11-29 IMAGING — CR DG CHEST 2V
3 series · 3 of 3 positions shown · non-contrast
Comparison: 09/11/2010

CLINICAL DATA: Left-sided chest pain, shortness of breath.

CHEST - 2 VIEW

[w chest lat (1 of 2)]
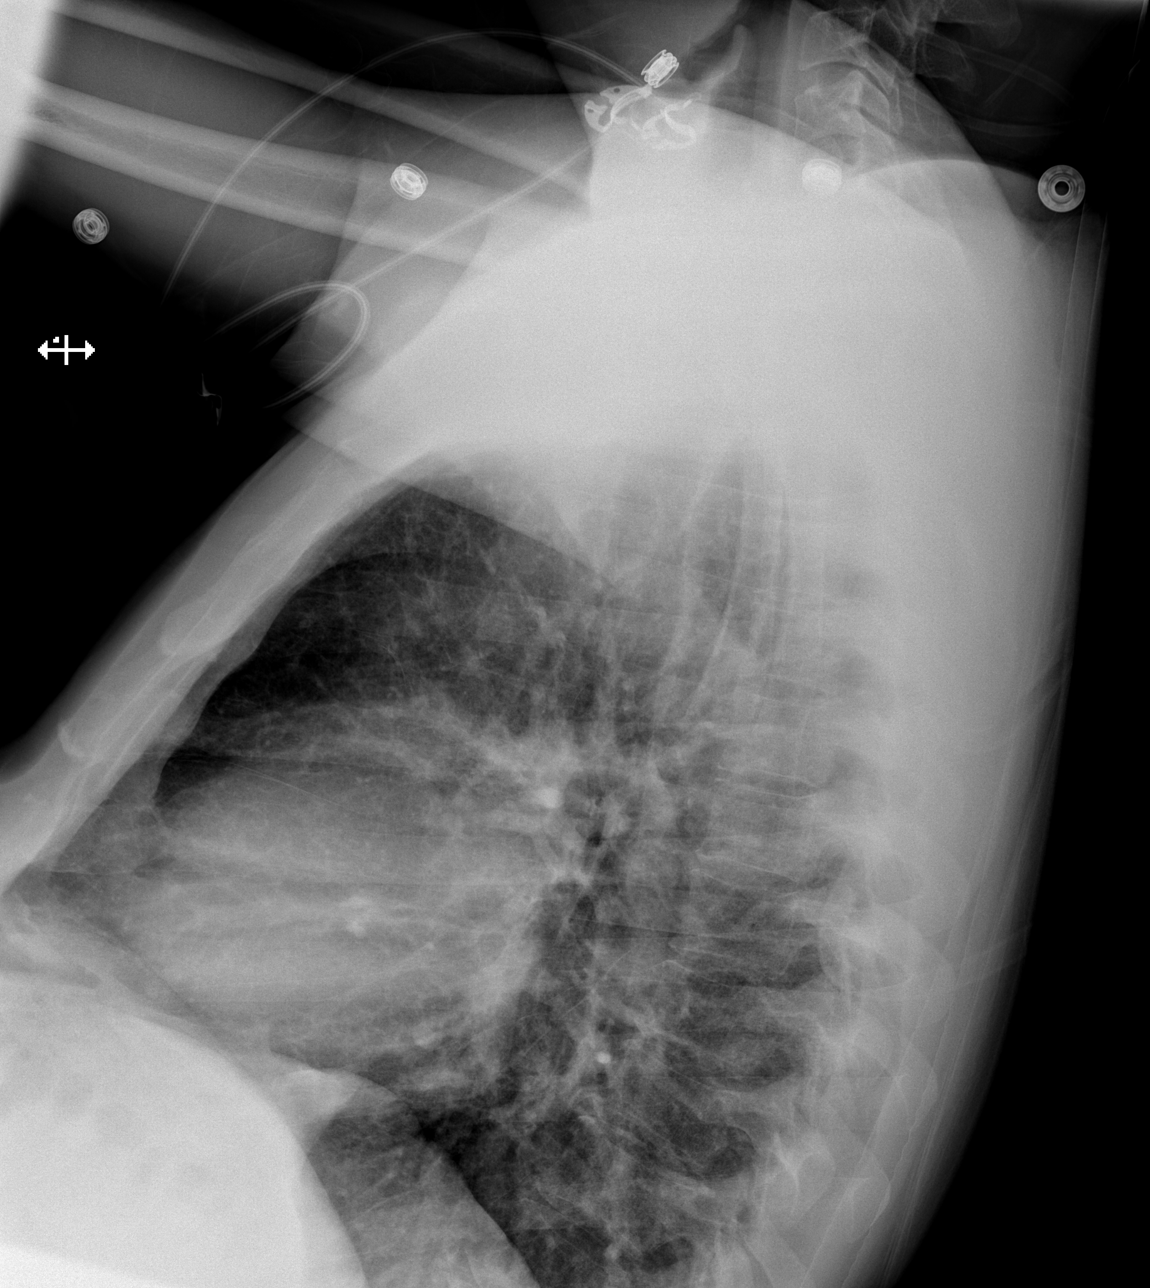

[w chest lat (2 of 2)]
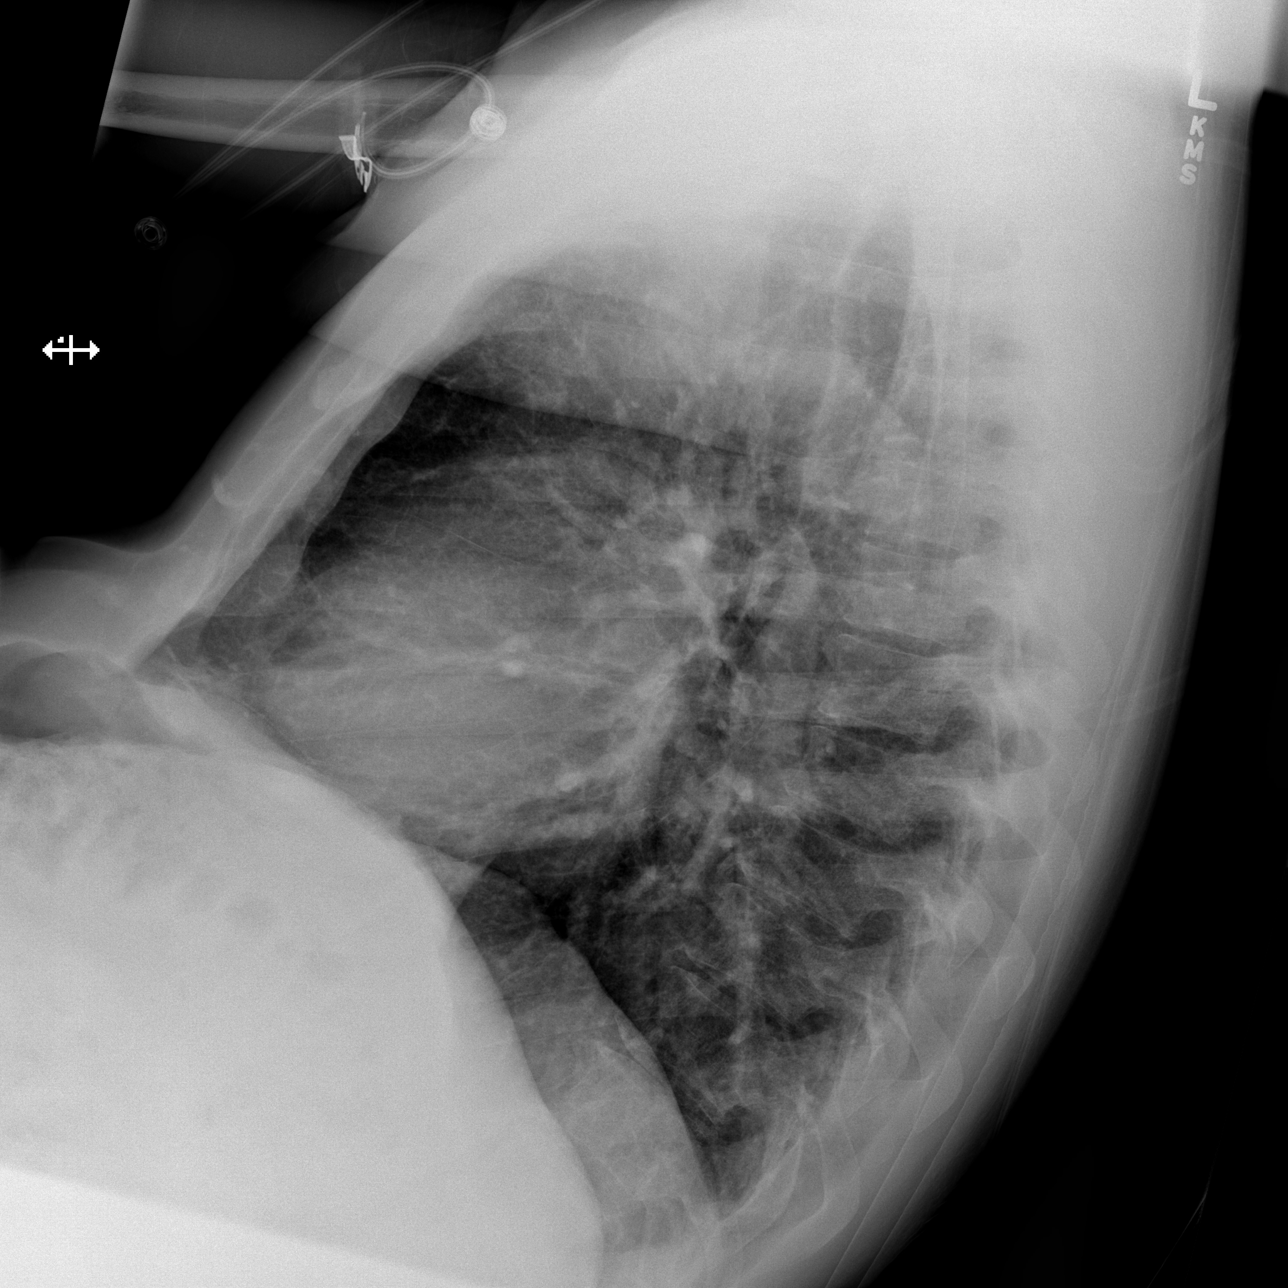

[x chest ap]
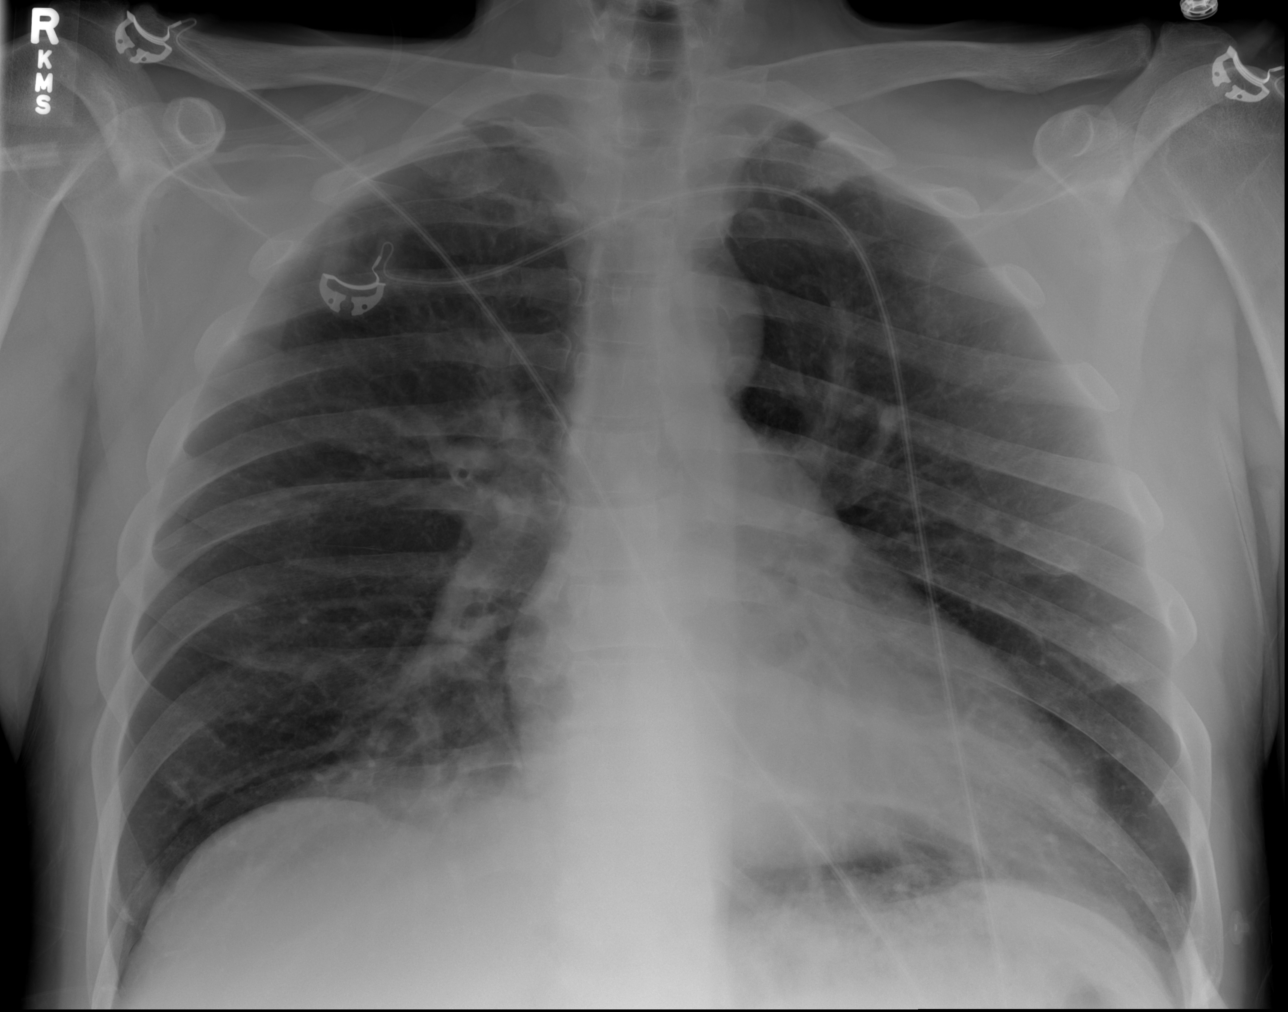

[3 of 3 positions shown; findings below may reference images not displayed]

FINDINGS: Interstitial prominence and mild right middle lung
opacity.  No pleural effusion or pneumothorax. Heart size upper
normal limits to mildly enlarged, with mild central
congestion/peribronchial fullness.  Mediastinal contours otherwise
within normal limits. No acute osseous abnormality identified.
IMPRESSION: Interstitial prominence with mild right middle lung opacity;
atelectasis versus infiltrate.

Heart size upper normal limits to mildly enlarged, with mild
central vascular fullness.

## 2011-11-29 IMAGING — CT CT HEAD W/O CM
2 series · 16 of 30 positions shown, 20 images · non-contrast
Comparison: Previous CT and MR examinations.

CLINICAL DATA: Seizure.

CT HEAD WITHOUT CONTRAST
TECHNIQUE: Contiguous axial images were obtained from the base of
the skull through the vertex without contrast.

[Series 2: head w/o · axial · non-contrast · 0.43mm/px · z∈[-174,-49]mm · 13 of 31 slices shown, 17 images]
[im 3/31  brain]
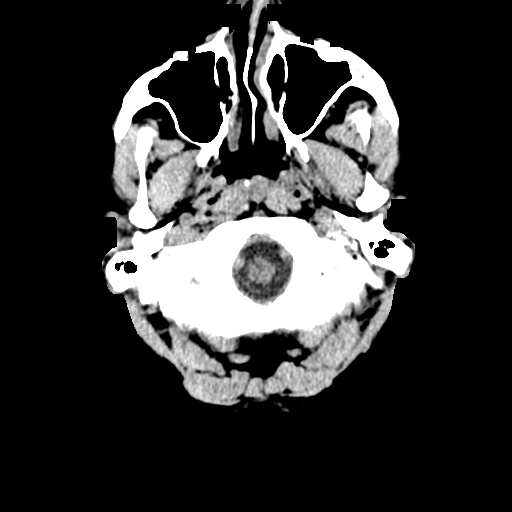
[im 3/31  bone]
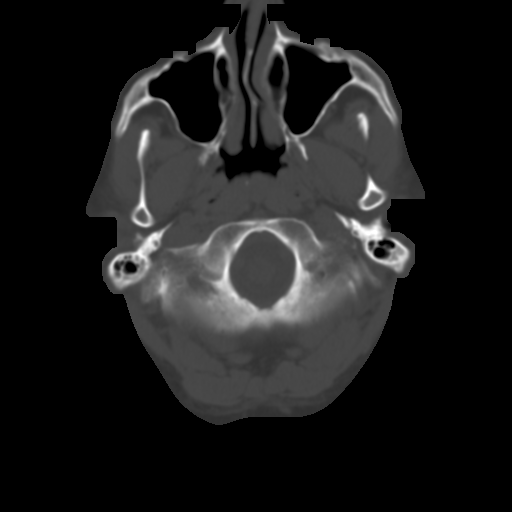
[im 5/31  brain]
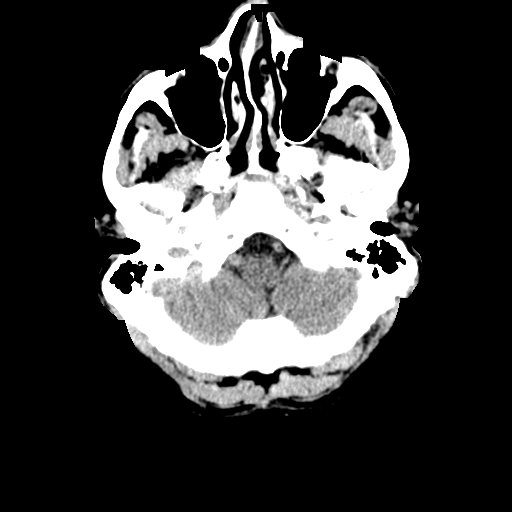
[im 7/31  brain]
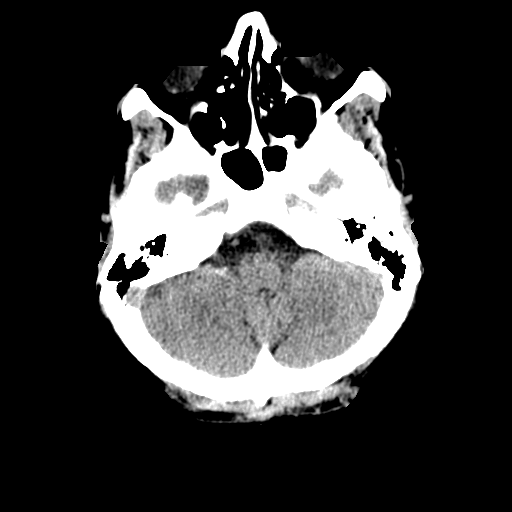
[im 9/31  brain]
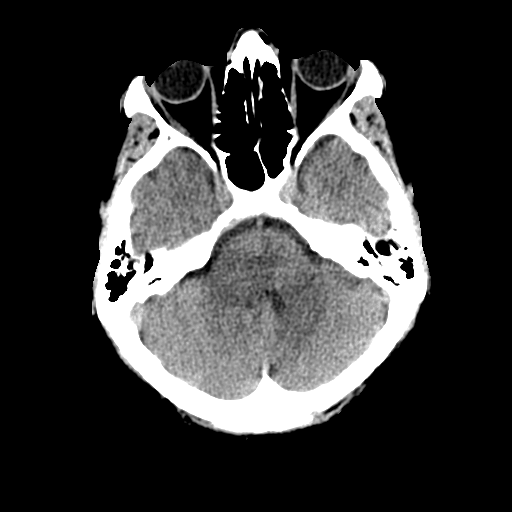
[im 11/31  brain]
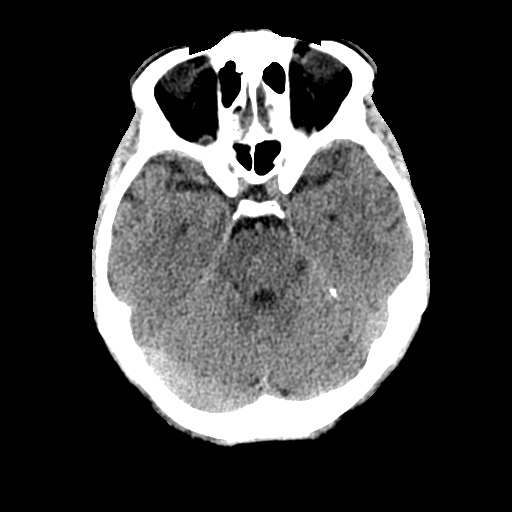
[im 11/31  bone]
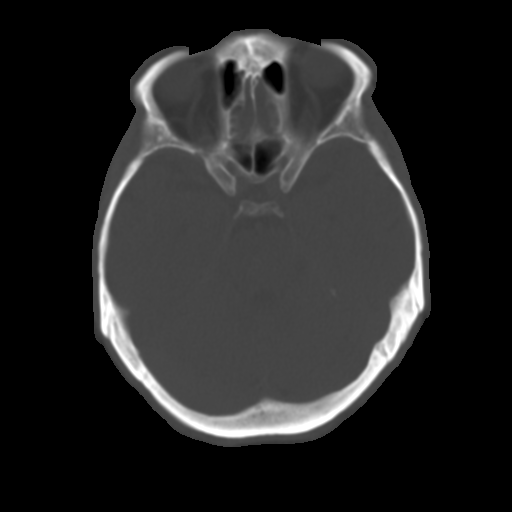
[im 13/31  brain]
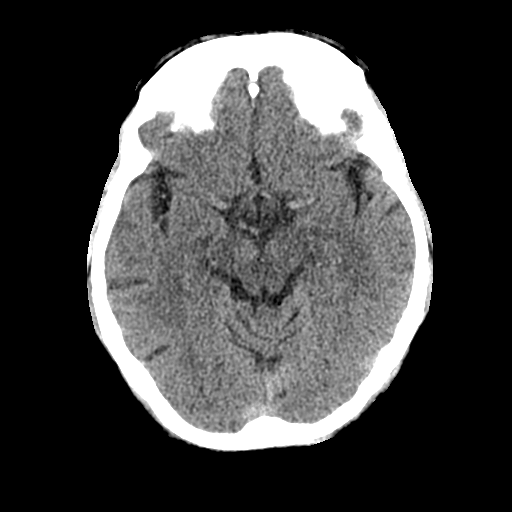
[im 16/31  brain]
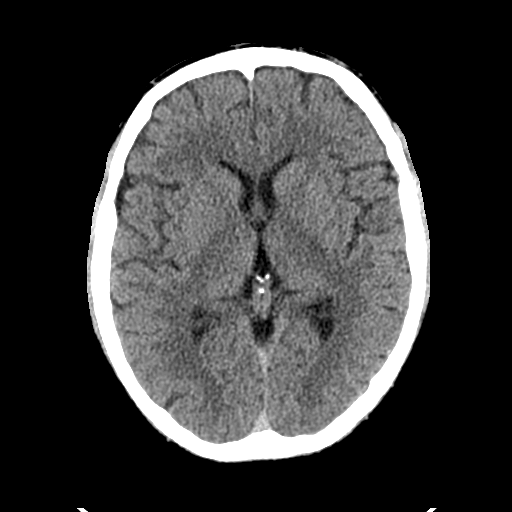
[im 18/31  brain]
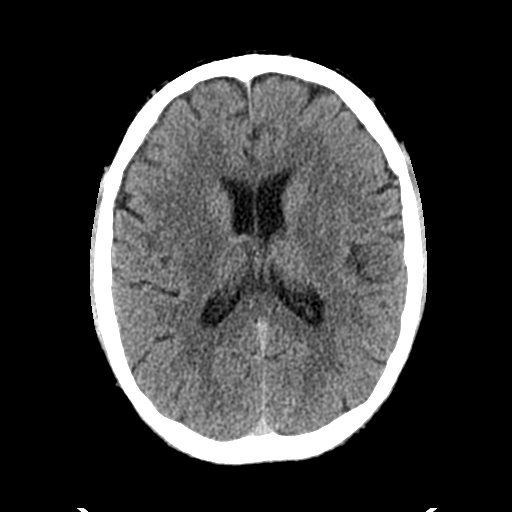
[im 20/31  brain]
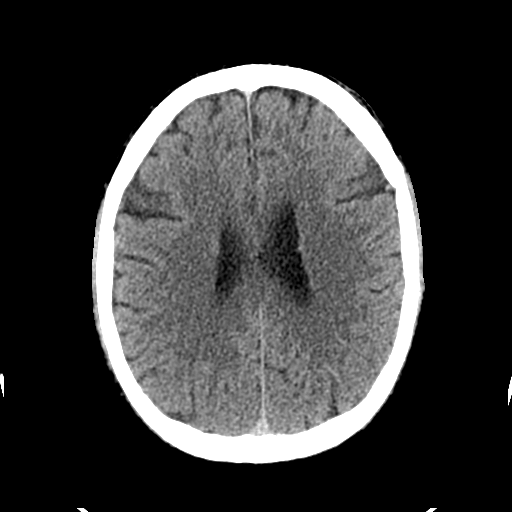
[im 20/31  bone]
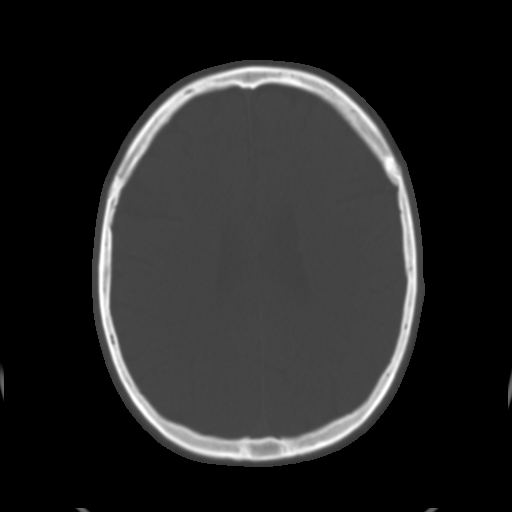
[im 22/31  brain]
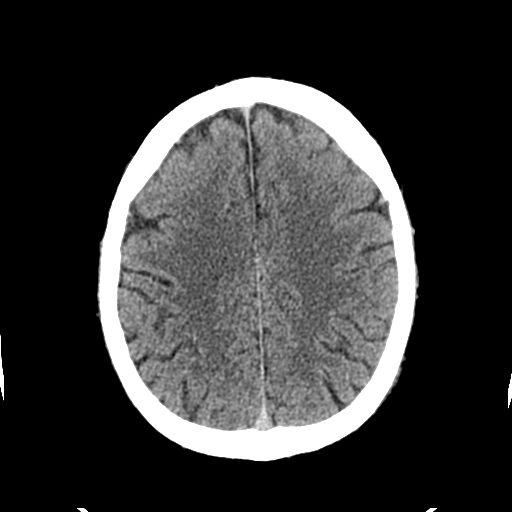
[im 24/31  brain]
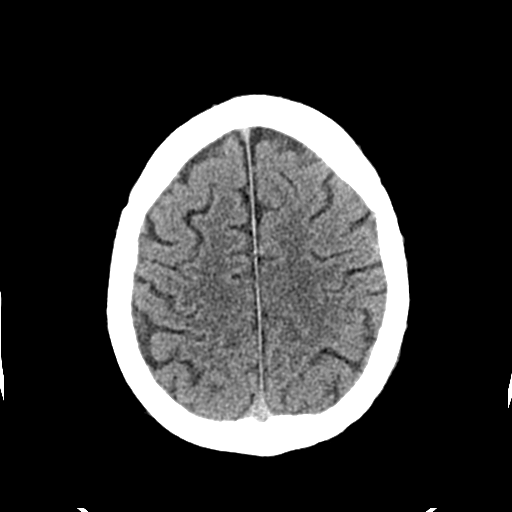
[im 26/31  brain]
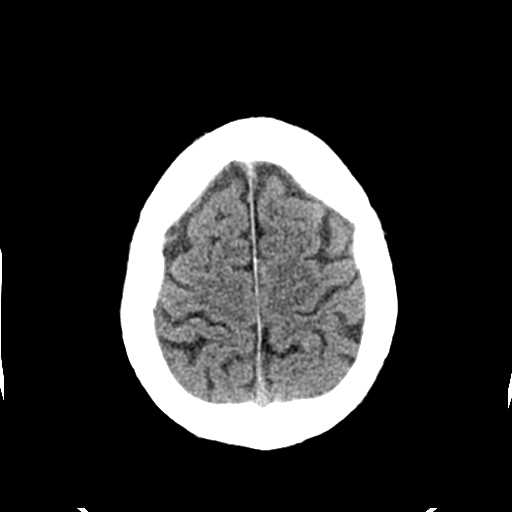
[im 28/31  brain]
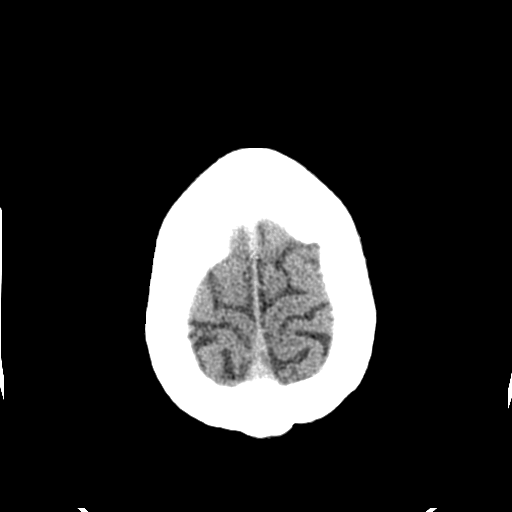
[im 28/31  bone]
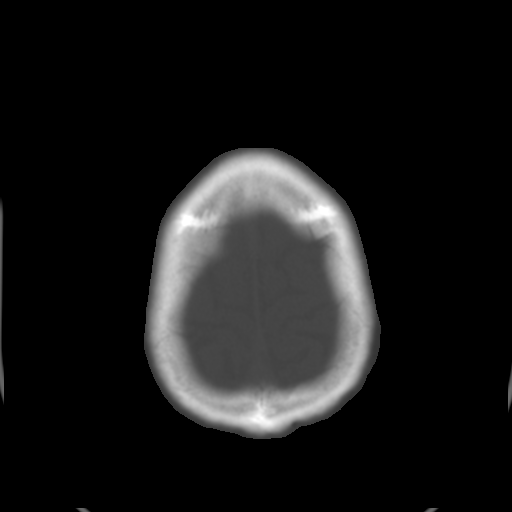

[Series 3: bone windows · axial · 0.43mm/px · z∈[-174,-134]mm · 3 of 31 slices shown]
[im 3/31  bone]
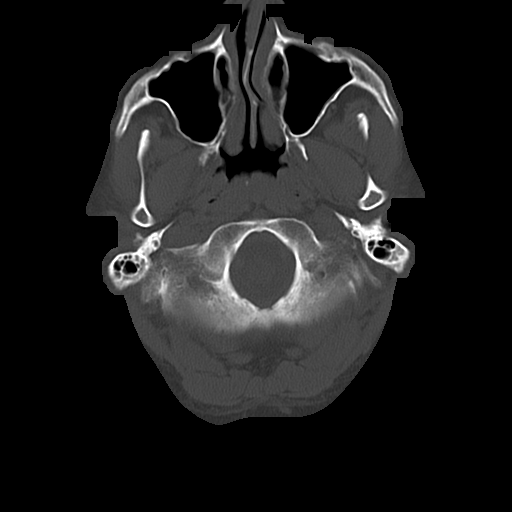
[im 7/31  bone]
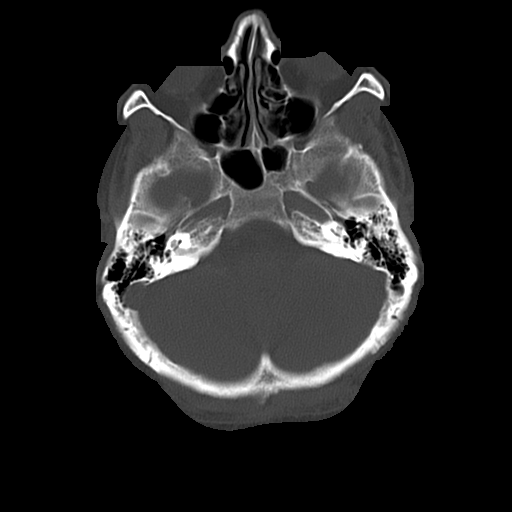
[im 11/31  bone]
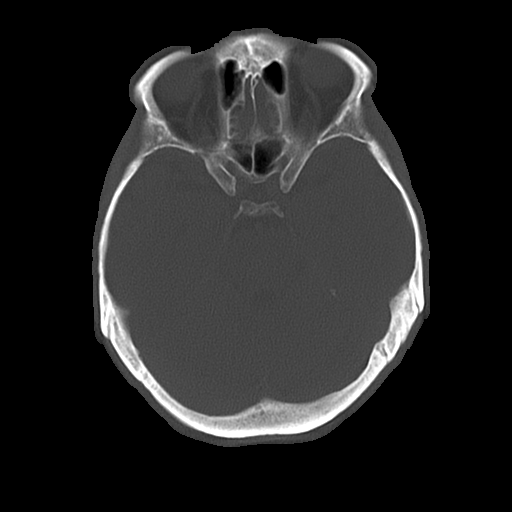

[16 of 30 positions shown; findings below may reference images not displayed]

FINDINGS: The ventricles and subarachnoid spaces remain slightly
prominent for the age of the patient.  Otherwise, normal appearing
cerebral hemispheres and posterior fossa structures.  Normal size
and position of the ventricles.  No intracranial hemorrhage, mass
lesion or CT evidence of acute infarction.
IMPRESSION: Stable minimal atrophy.  No acute abnormality.

## 2012-01-07 IMAGING — CR DG CHEST 2V
2 series · 2 of 2 positions shown · non-contrast
Comparison: Chest x-ray 12/14/2010.

CLINICAL DATA: Cough and chest pain.

CHEST - 2 VIEW

[w chest pa]
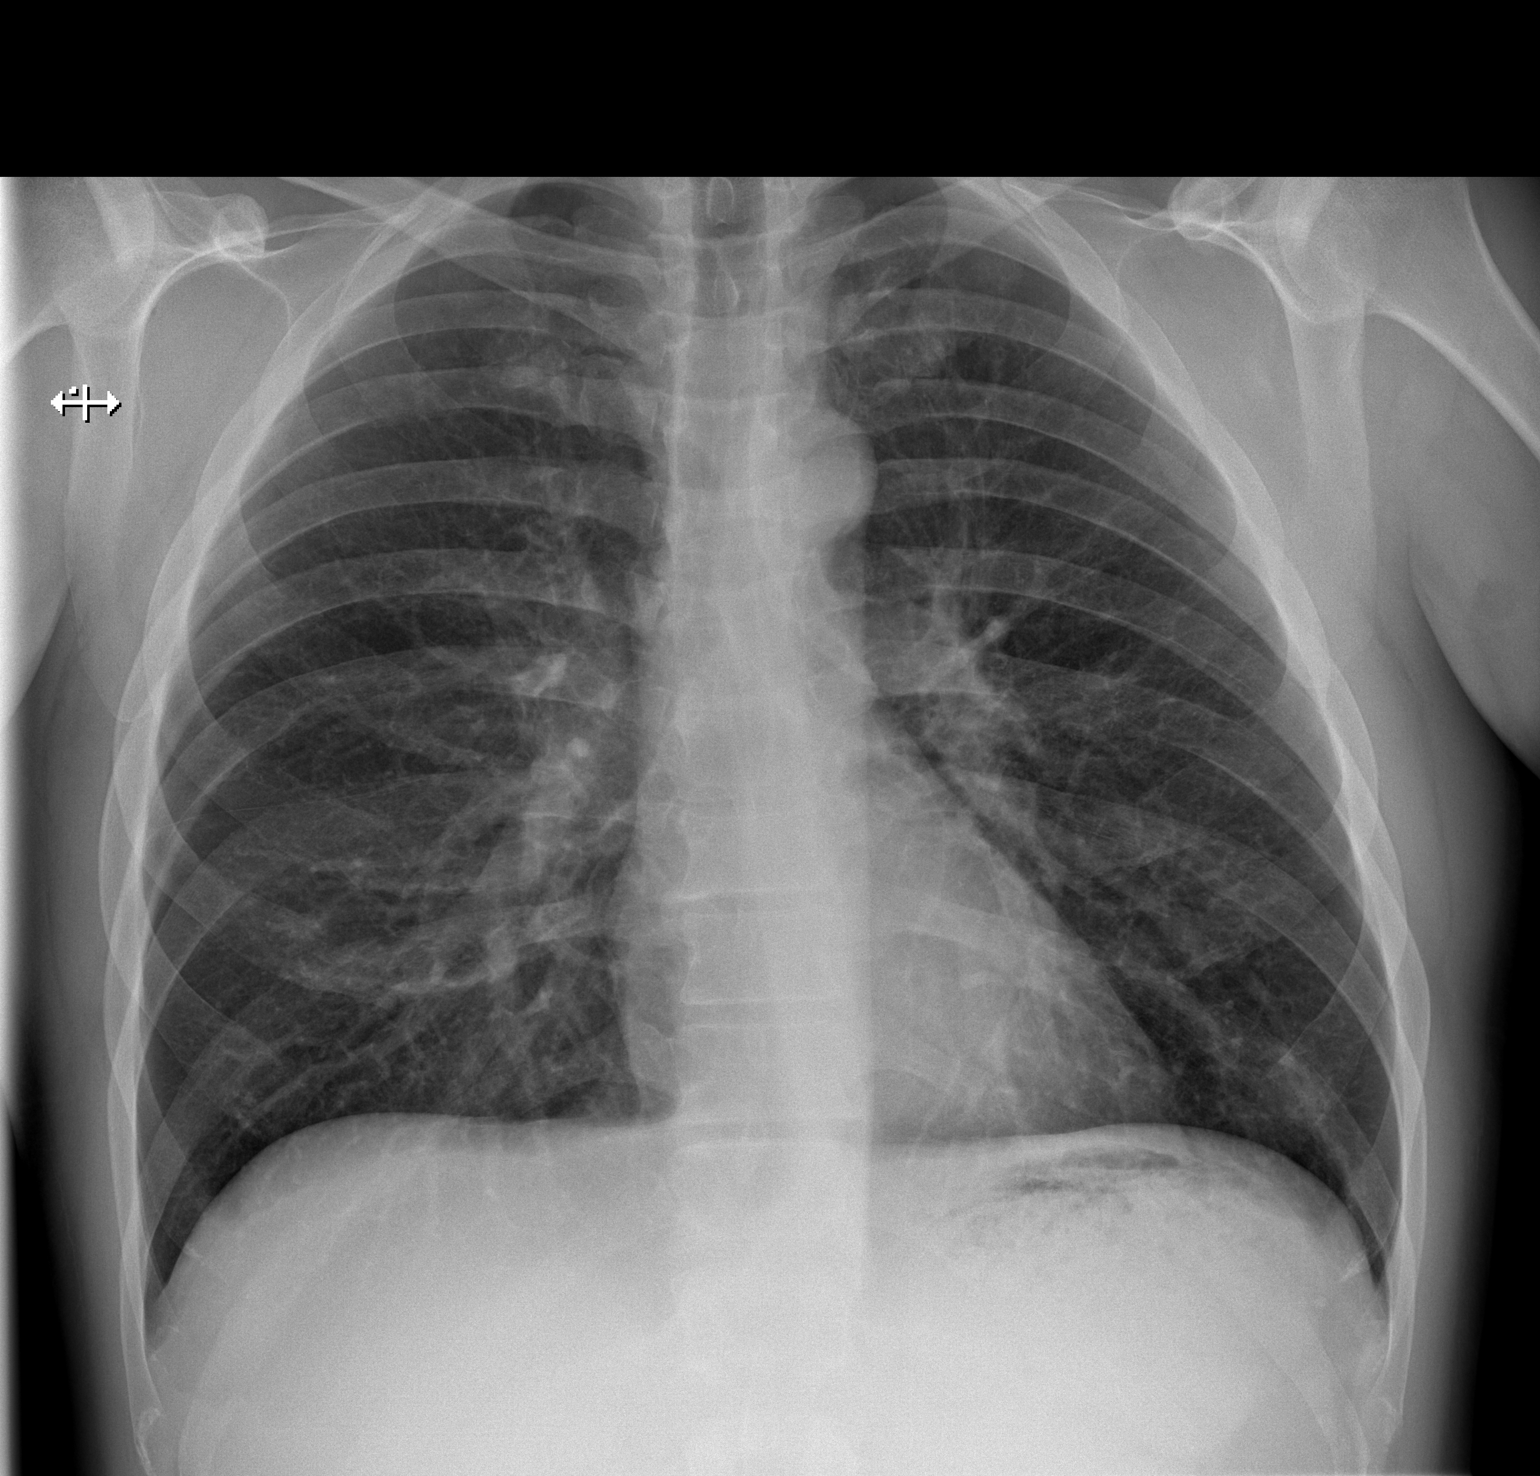

[w chest lat]
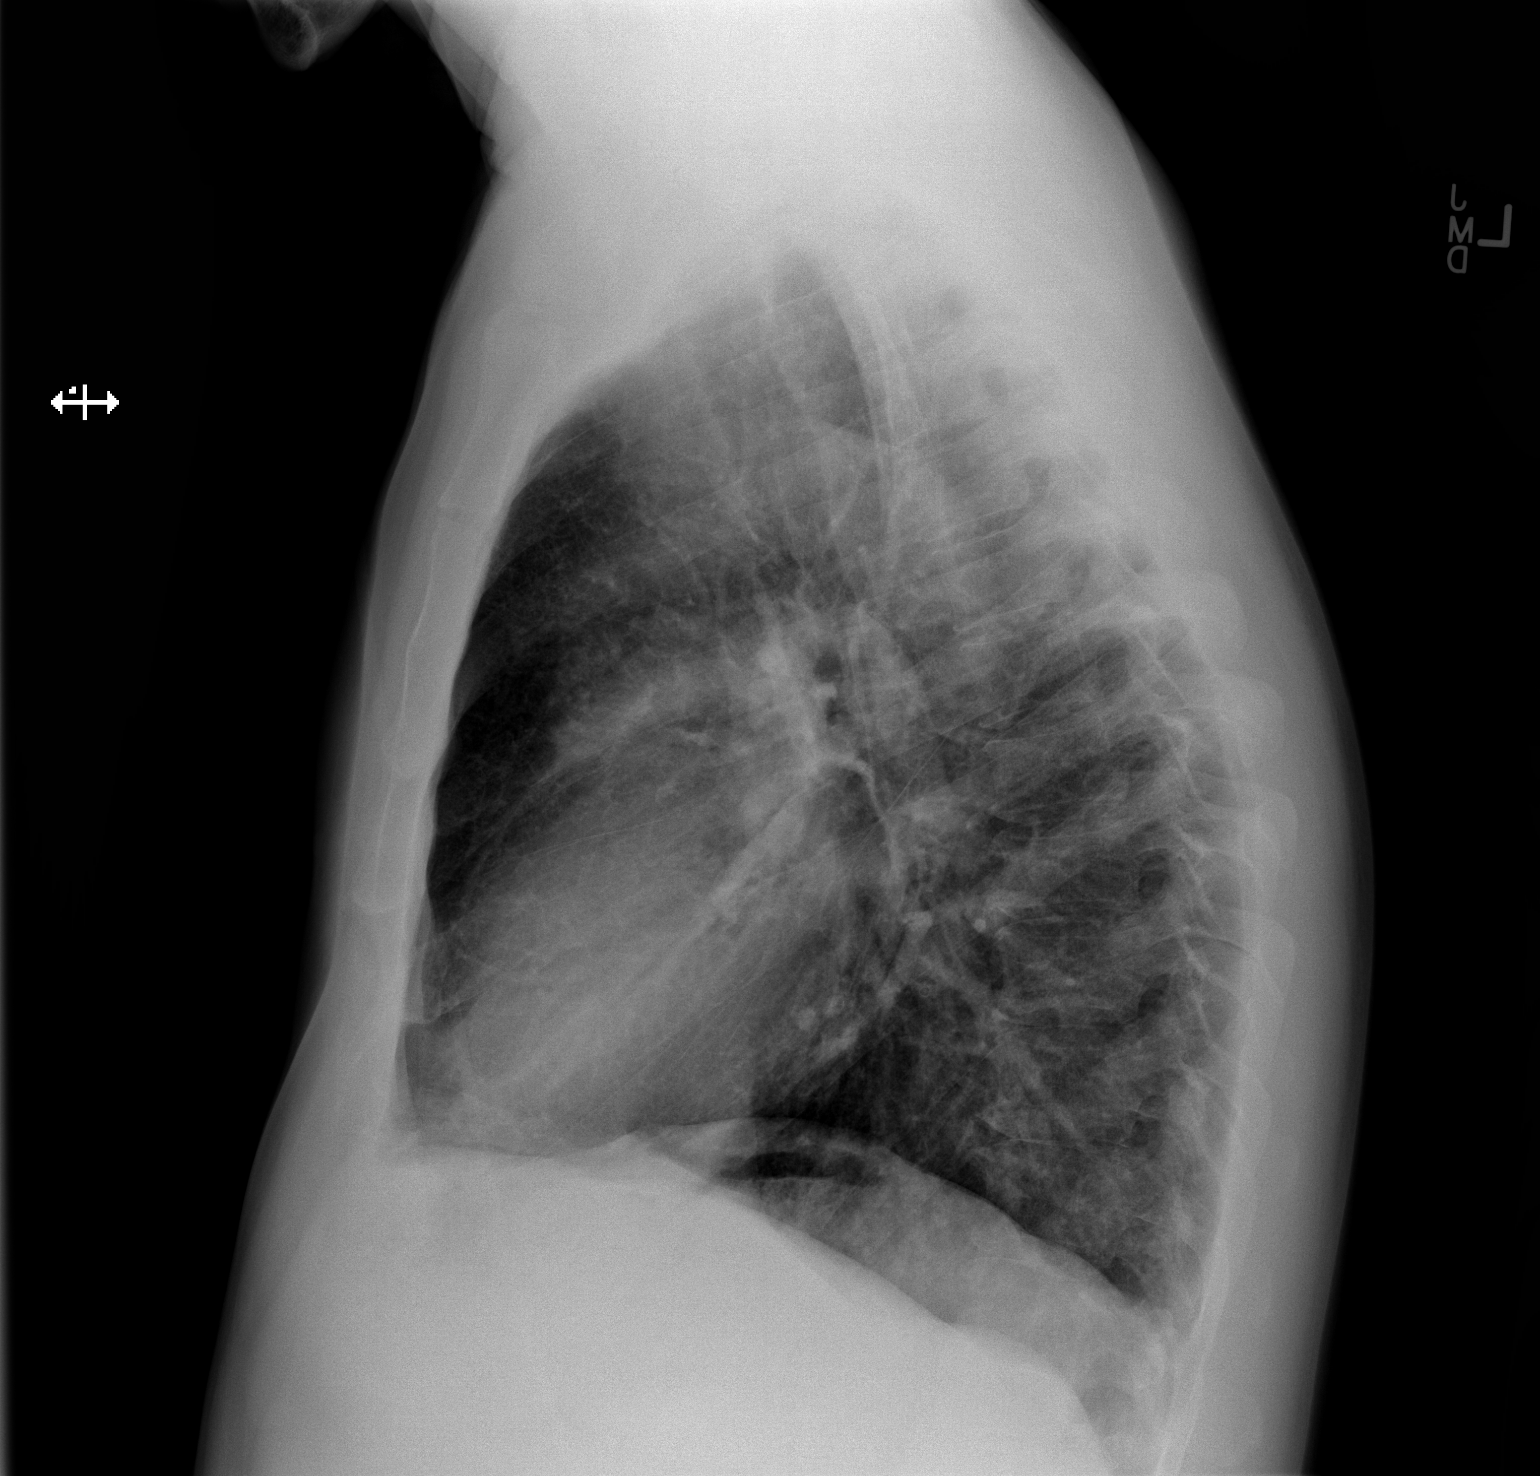

[2 of 2 positions shown; findings below may reference images not displayed]

FINDINGS: The cardiac silhouette, mediastinal and hilar contours
are within normal limits and stable.  The lungs are clear.  No
pleural effusion.  The bony thorax is intact.
IMPRESSION: No acute cardiopulmonary findings.

## 2012-02-24 ENCOUNTER — Encounter (HOSPITAL_COMMUNITY): Payer: Self-pay | Admitting: *Deleted

## 2012-02-24 ENCOUNTER — Emergency Department (HOSPITAL_COMMUNITY)
Admission: EM | Admit: 2012-02-24 | Discharge: 2012-02-25 | Disposition: A | Discharge status: Home Health | Payer: Self-pay | Attending: Emergency Medicine | Admitting: Emergency Medicine

## 2012-02-24 DIAGNOSIS — I10 Essential (primary) hypertension: Secondary | ICD-10-CM | POA: Insufficient documentation

## 2012-02-24 DIAGNOSIS — F1021 Alcohol dependence, in remission: Secondary | ICD-10-CM | POA: Insufficient documentation

## 2012-02-24 DIAGNOSIS — Z79899 Other long term (current) drug therapy: Secondary | ICD-10-CM | POA: Insufficient documentation

## 2012-02-24 DIAGNOSIS — Z8669 Personal history of other diseases of the nervous system and sense organs: Secondary | ICD-10-CM | POA: Insufficient documentation

## 2012-02-24 DIAGNOSIS — Z8719 Personal history of other diseases of the digestive system: Secondary | ICD-10-CM | POA: Insufficient documentation

## 2012-02-24 DIAGNOSIS — F3289 Other specified depressive episodes: Secondary | ICD-10-CM | POA: Insufficient documentation

## 2012-02-24 DIAGNOSIS — G40909 Epilepsy, unspecified, not intractable, without status epilepticus: Secondary | ICD-10-CM | POA: Insufficient documentation

## 2012-02-24 DIAGNOSIS — Z8679 Personal history of other diseases of the circulatory system: Secondary | ICD-10-CM | POA: Insufficient documentation

## 2012-02-24 DIAGNOSIS — F411 Generalized anxiety disorder: Secondary | ICD-10-CM | POA: Insufficient documentation

## 2012-02-24 DIAGNOSIS — G8929 Other chronic pain: Secondary | ICD-10-CM | POA: Insufficient documentation

## 2012-02-24 DIAGNOSIS — R109 Unspecified abdominal pain: Secondary | ICD-10-CM | POA: Insufficient documentation

## 2012-02-24 DIAGNOSIS — Z7982 Long term (current) use of aspirin: Secondary | ICD-10-CM | POA: Insufficient documentation

## 2012-02-24 DIAGNOSIS — F17200 Nicotine dependence, unspecified, uncomplicated: Secondary | ICD-10-CM | POA: Insufficient documentation

## 2012-02-24 DIAGNOSIS — F329 Major depressive disorder, single episode, unspecified: Secondary | ICD-10-CM | POA: Insufficient documentation

## 2012-02-24 DIAGNOSIS — M549 Dorsalgia, unspecified: Secondary | ICD-10-CM | POA: Insufficient documentation

## 2012-02-24 DIAGNOSIS — Z8619 Personal history of other infectious and parasitic diseases: Secondary | ICD-10-CM | POA: Insufficient documentation

## 2012-02-24 HISTORY — DX: Unspecified convulsions: R56.9

## 2012-02-24 LAB — CBC WITH DIFFERENTIAL/PLATELET
Basophils Absolute: 0.1 10*3/uL (ref 0.0–0.1)
Eosinophils Absolute: 0.2 10*3/uL (ref 0.0–0.7)
Eosinophils Relative: 2 % (ref 0–5)
HCT: 47.3 % (ref 39.0–52.0)
Hemoglobin: 16.2 g/dL (ref 13.0–17.0)
Lymphocytes Relative: 33 % (ref 12–46)
Lymphs Abs: 3 10*3/uL (ref 0.7–4.0)
MCHC: 34.2 g/dL (ref 30.0–36.0)
Neutro Abs: 5 10*3/uL (ref 1.7–7.7)
Neutrophils Relative %: 56 % (ref 43–77)
RBC: 5.21 MIL/uL (ref 4.22–5.81)
WBC: 9 10*3/uL (ref 4.0–10.5)

## 2012-02-24 LAB — COMPREHENSIVE METABOLIC PANEL
ALT: 34 U/L (ref 0–53)
BUN: 13 mg/dL (ref 6–23)
CO2: 25 mEq/L (ref 19–32)
Calcium: 9.6 mg/dL (ref 8.4–10.5)
Creatinine, Ser: 0.95 mg/dL (ref 0.50–1.35)
GFR calc Af Amer: >90 mL/min (ref >90)
GFR calc non Af Amer: >90 mL/min (ref >90)
Glucose, Bld: 88 mg/dL (ref 70–99)
Sodium: 140 mEq/L (ref 135–145)
Total Protein: 7.5 g/dL (ref 6.0–8.3)

## 2012-02-24 LAB — URINALYSIS, MICROSCOPIC ONLY
Bilirubin Urine: NEGATIVE
Hgb urine dipstick: NEGATIVE
Ketones, ur: NEGATIVE mg/dL
Nitrite: NEGATIVE
pH: 8 (ref 5.0–8.0)

## 2012-02-24 LAB — LIPASE, BLOOD: Lipase: 47 U/L (ref 11–59)

## 2012-02-24 NOTE — ED Notes (Signed)
Pt is seeking tx for hepatitis C.  He states he was told when he was in rehab from drugs/etoh that he had hep C and he continues to have R sided abd.  Denies nv, but states diarrhea.

## 2012-02-24 NOTE — ED Notes (Signed)
Pt states that he is supposed to be on dilantin and is trying to find a place for medical care

## 2012-02-25 ENCOUNTER — Emergency Department (HOSPITAL_COMMUNITY): Payer: Self-pay

## 2012-02-25 ENCOUNTER — Encounter (HOSPITAL_COMMUNITY): Payer: Self-pay | Admitting: Radiology

## 2012-02-25 MED ORDER — DICYCLOMINE HCL 20 MG PO TABS: 20.0000 mg | ORAL_TABLET | Freq: Two times a day (BID) | ORAL | Status: DC

## 2012-02-25 MED ORDER — ESOMEPRAZOLE MAGNESIUM 40 MG PO CPDR: 40.0000 mg | DELAYED_RELEASE_CAPSULE | Freq: Every day | ORAL | Status: DC

## 2012-02-25 MED ORDER — PHENYTOIN SODIUM EXTENDED 100 MG PO CAPS: 100.0000 mg | ORAL_CAPSULE | Freq: Three times a day (TID) | ORAL | Status: DC

## 2012-02-25 MED ORDER — HYDROCODONE-ACETAMINOPHEN 5-325 MG PO TABS
1.0000 | ORAL_TABLET | Freq: Once | ORAL | Status: AC
  Administered 2012-02-25: 1 via ORAL
  Filled 2012-02-25: qty 1

## 2012-02-25 NOTE — ED Notes (Signed)
Patient transported to CT 

## 2012-02-25 NOTE — ED Notes (Signed)
States he is feeling better and is ready to go home.  Asked for note because his missed his "mandatory Meeting"

## 2012-02-25 NOTE — ED Provider Notes (Addendum)
History     CSN: [id number]  Arrival date & time 02/24/12  1730   First MD Initiated Contact with Patient 02/24/12 2352      Chief Complaint  Patient presents with  . Abdominal Pain    (Consider location/radiation/quality/duration/timing/severity/associated sxs/prior treatment) Patient is a 42 y.o. male presenting with abdominal pain. The history is provided by the patient.  Abdominal Pain The primary symptoms of the illness include abdominal pain. Episode onset: ago. The onset of the illness was gradual. The problem has been gradually worsening.  Associated medical issues comments: hep c.    Past Medical History  Diagnosis Date  . Hepatitis C   . Chronic back pain   . A-fib   . Depression   . Anxiety   . Hepatitis C     history of  . Chronic back pain   . Acid reflux   . History of urinary frequency   . Peripheral neuropathy     hands and feet  . Hypertension   . Polysubstance abuse 01/25/2011  . Alcohol abuse   . Seizures     Past Surgical History  Procedure Date  . Cardioversion 03/07/2006    No family history on file.  History  Substance Use Topics  . Smoking status: Current Every Day Smoker -- 1.0 packs/day for 25 years    Types: Cigarettes  . Smokeless tobacco: Never Used  . Alcohol Use: 14.4 oz/week    24 Cans of beer per week     Comment: stopped 05/20/2011      Review of Systems  Gastrointestinal: Positive for abdominal pain.  All other systems reviewed and are negative.    Allergies  Review of patient's allergies indicates no known allergies.  Home Medications   Current Outpatient Rx  Name  Route  Sig  Dispense  Refill  . ASPIRIN EC 81 MG PO TBEC   Oral   Take 81 mg by mouth daily.         [name] PHENYTOIN SODIUM EXTENDED 100 MG PO CAPS   Oral   Take 300 mg by mouth 3 (three) times daily.           BP 135/84  Pulse 101  Temp 98.5 F (36.9 C) (Oral)  Resp 18  SpO2 97%  Physical Exam  Abdominal:      ED Course    Procedures (including critical care time)  Labs Reviewed  URINALYSIS, MICROSCOPIC ONLY - Abnormal; Notable for the following:    APPearance CLOUDY (*)     All other components within normal limits  COMPREHENSIVE METABOLIC PANEL  CBC WITH DIFFERENTIAL  LIPASE, BLOOD   No results found.   No diagnosis found.    MDM  + abd pain,  Will reassess  Ct neg.  Will dc to fu        Tishie Altmann Lytle Michaels, MD 02/25/12 4098  Rosanne Ashing, MD 02/25/12 1191

## 2012-03-28 ENCOUNTER — Other Ambulatory Visit: Payer: Self-pay

## 2012-04-01 ENCOUNTER — Emergency Department (HOSPITAL_COMMUNITY)
Admission: EM | Admit: 2012-04-01 | Discharge: 2012-04-02 | Disposition: A | Discharge status: Home / Self Care | Payer: Self-pay | Attending: Emergency Medicine | Admitting: Emergency Medicine

## 2012-04-01 ENCOUNTER — Encounter (HOSPITAL_COMMUNITY): Payer: Self-pay | Admitting: Emergency Medicine

## 2012-04-01 DIAGNOSIS — Z8619 Personal history of other infectious and parasitic diseases: Secondary | ICD-10-CM | POA: Insufficient documentation

## 2012-04-01 DIAGNOSIS — G8929 Other chronic pain: Secondary | ICD-10-CM | POA: Insufficient documentation

## 2012-04-01 DIAGNOSIS — F1910 Other psychoactive substance abuse, uncomplicated: Secondary | ICD-10-CM | POA: Insufficient documentation

## 2012-04-01 DIAGNOSIS — Z8679 Personal history of other diseases of the circulatory system: Secondary | ICD-10-CM | POA: Insufficient documentation

## 2012-04-01 DIAGNOSIS — F3289 Other specified depressive episodes: Secondary | ICD-10-CM | POA: Insufficient documentation

## 2012-04-01 DIAGNOSIS — F1010 Alcohol abuse, uncomplicated: Secondary | ICD-10-CM | POA: Insufficient documentation

## 2012-04-01 DIAGNOSIS — I10 Essential (primary) hypertension: Secondary | ICD-10-CM | POA: Insufficient documentation

## 2012-04-01 DIAGNOSIS — G40909 Epilepsy, unspecified, not intractable, without status epilepticus: Secondary | ICD-10-CM | POA: Insufficient documentation

## 2012-04-01 DIAGNOSIS — Z8669 Personal history of other diseases of the nervous system and sense organs: Secondary | ICD-10-CM | POA: Insufficient documentation

## 2012-04-01 DIAGNOSIS — M549 Dorsalgia, unspecified: Secondary | ICD-10-CM | POA: Insufficient documentation

## 2012-04-01 DIAGNOSIS — F17200 Nicotine dependence, unspecified, uncomplicated: Secondary | ICD-10-CM | POA: Insufficient documentation

## 2012-04-01 DIAGNOSIS — Z79899 Other long term (current) drug therapy: Secondary | ICD-10-CM | POA: Insufficient documentation

## 2012-04-01 LAB — URINALYSIS, ROUTINE W REFLEX MICROSCOPIC
Bilirubin Urine: NEGATIVE
Glucose, UA: NEGATIVE mg/dL
Hgb urine dipstick: NEGATIVE
Ketones, ur: NEGATIVE mg/dL
Protein, ur: NEGATIVE mg/dL

## 2012-04-01 LAB — COMPREHENSIVE METABOLIC PANEL
Albumin: 3.7 g/dL (ref 3.5–5.2)
BUN: 10 mg/dL (ref 6–23)
Calcium: 8.9 mg/dL (ref 8.4–10.5)
Creatinine, Ser: 0.71 mg/dL (ref 0.50–1.35)
GFR calc Af Amer: >90 mL/min (ref >90)
Glucose, Bld: 82 mg/dL (ref 70–99)
Total Protein: 7.5 g/dL (ref 6.0–8.3)

## 2012-04-01 LAB — CBC WITH DIFFERENTIAL/PLATELET
Basophils Relative: 1 % (ref 0–1)
Eosinophils Absolute: 0.2 10*3/uL (ref 0.0–0.7)
Eosinophils Relative: 1 % (ref 0–5)
Hemoglobin: 16.5 g/dL (ref 13.0–17.0)
Lymphs Abs: 3.3 10*3/uL (ref 0.7–4.0)
MCH: 31.2 pg (ref 26.0–34.0)
MCHC: 35.4 g/dL (ref 30.0–36.0)
MCV: 88.1 fL (ref 78.0–100.0)
Monocytes Relative: 6 % (ref 3–12)
Neutrophils Relative %: 62 % (ref 43–77)
RBC: 5.29 MIL/uL (ref 4.22–5.81)

## 2012-04-01 LAB — RAPID URINE DRUG SCREEN, HOSP PERFORMED
Amphetamines: NOT DETECTED
Benzodiazepines: NOT DETECTED
Cocaine: NOT DETECTED
Opiates: NOT DETECTED
Tetrahydrocannabinol: NOT DETECTED

## 2012-04-01 LAB — ETHANOL: Alcohol, Ethyl (B): 58 mg/dL — ABNORMAL HIGH (ref 0–11)

## 2012-04-01 MED ORDER — THIAMINE HCL 100 MG/ML IJ SOLN: 100.0000 mg | Freq: Every day | INTRAMUSCULAR | Status: DC

## 2012-04-01 MED ORDER — IBUPROFEN 600 MG PO TABS: 600.0000 mg | ORAL_TABLET | Freq: Three times a day (TID) | ORAL | Status: DC | PRN

## 2012-04-01 MED ORDER — LORAZEPAM 1 MG PO TABS
1.0000 mg | ORAL_TABLET | Freq: Four times a day (QID) | ORAL | Status: DC | PRN
  Administered 2012-04-01: 1 mg via ORAL
  Filled 2012-04-01: qty 1

## 2012-04-01 MED ORDER — NICOTINE 21 MG/24HR TD PT24
21.0000 mg | MEDICATED_PATCH | Freq: Every day | TRANSDERMAL | Status: DC
  Filled 2012-04-01: qty 1

## 2012-04-01 MED ORDER — LORAZEPAM 2 MG/ML IJ SOLN: 1.0000 mg | Freq: Four times a day (QID) | INTRAMUSCULAR | Status: DC | PRN

## 2012-04-01 MED ORDER — VITAMIN B-1 100 MG PO TABS
100.0000 mg | ORAL_TABLET | Freq: Every day | ORAL | Status: DC
  Administered 2012-04-01: 100 mg via ORAL
  Filled 2012-04-01: qty 1

## 2012-04-01 MED ORDER — ADULT MULTIVITAMIN W/MINERALS CH
1.0000 | ORAL_TABLET | Freq: Every day | ORAL | Status: DC
  Administered 2012-04-01: 1 via ORAL
  Filled 2012-04-01: qty 1

## 2012-04-01 MED ORDER — ONDANSETRON HCL 4 MG PO TABS
4.0000 mg | ORAL_TABLET | Freq: Three times a day (TID) | ORAL | Status: DC | PRN
  Administered 2012-04-01: 4 mg via ORAL
  Filled 2012-04-01: qty 1

## 2012-04-01 MED ORDER — ACETAMINOPHEN 325 MG PO TABS
650.0000 mg | ORAL_TABLET | ORAL | Status: DC | PRN
  Administered 2012-04-01: 650 mg via ORAL
  Filled 2012-04-01: qty 2

## 2012-04-01 MED ORDER — ALUM & MAG HYDROXIDE-SIMETH 200-200-20 MG/5ML PO SUSP: 30.0000 mL | ORAL | Status: DC | PRN

## 2012-04-01 MED ORDER — FOLIC ACID 1 MG PO TABS
1.0000 mg | ORAL_TABLET | Freq: Every day | ORAL | Status: DC
  Administered 2012-04-01: 1 mg via ORAL
  Filled 2012-04-01: qty 1

## 2012-04-01 MED ORDER — ZOLPIDEM TARTRATE 5 MG PO TABS: 5.0000 mg | ORAL_TABLET | Freq: Every evening | ORAL | Status: DC | PRN

## 2012-04-01 NOTE — ED Notes (Signed)
Pt states he wants to detox from ETOH, states last drink 3 hours ago, 40oz beer and he drank a 1/5th of liqueur this morning.  Pt states he drinks at least a 1/5th of liqueur everyday.  Pt states last sober from May 20, 2011 until about a month ago.  Pt states he had a lot of stressors at that time, including currently being on parole.  Pt states thoughts of SI "come and go because of the alcohol"

## 2012-04-01 NOTE — ED Provider Notes (Signed)
History     CSN: [id number]  Arrival date & time 04/01/12  1884   First MD Initiated Contact with Patient 04/01/12 2059      Chief Complaint  Patient presents with  . Medical Clearance    detox    (Consider location/radiation/quality/duration/timing/severity/associated sxs/prior treatment) The history is provided by the patient.   patient here requesting detox and alcohol abuse. Has been drinking liquor as well as beer every day. States he does feel suicidal when his trunk. Denies any history of suicide attempt in the past. Does have a history of alcohol abuse. No homicidal ideations. No auditory or visual hallucinations. Notes some increased depression. Patient is currently on parole   Past Medical History  Diagnosis Date  . Hepatitis C   . Chronic back pain   . A-fib   . Depression   . Anxiety   . Hepatitis C     history of  . Chronic back pain   . Acid reflux   . History of urinary frequency   . Peripheral neuropathy     hands and feet  . Hypertension   . Polysubstance abuse 01/25/2011  . Alcohol abuse   . Seizures     Past Surgical History  Procedure Laterality Date  . Cardioversion  03/07/2006    History reviewed. No pertinent family history.  History  Substance Use Topics  . Smoking status: Current Every Day Smoker -- 1.00 packs/day for 25 years    Types: Cigarettes  . Smokeless tobacco: Never Used  . Alcohol Use: 0.0 oz/week     Comment: 1/5th liqueur x22mo      Review of Systems  All other systems reviewed and are negative.    Allergies  Review of patient's allergies indicates no known allergies.  Home Medications   Current Outpatient Rx  Name  Route  Sig  Dispense  Refill  . aspirin EC 81 MG tablet   Oral   Take 81 mg by mouth daily.         . phenytoin (DILANTIN) 100 MG ER capsule   Oral   Take 300 mg by mouth daily.            BP 148/88  Pulse 78  Temp(Src) 98.7 F (37.1 C) (Oral)  Resp 18  Ht 5\' 8"  (1.727 m)  Wt 200 lb  (90.719 kg)  BMI 30.42 kg/m2  SpO2 97%  Physical Exam  Nursing note and vitals reviewed. Constitutional: He is oriented to person, place, and time. He appears well-developed and well-nourished.  Non-toxic appearance. No distress.  HENT:  Head: Normocephalic and atraumatic.  Eyes: Conjunctivae, EOM and lids are normal. Pupils are equal, round, and reactive to light.  Neck: Normal range of motion. Neck supple. No tracheal deviation present. No mass present.  Cardiovascular: Normal rate, regular rhythm and normal heart sounds.  Exam reveals no gallop.   No murmur heard. Pulmonary/Chest: Effort normal and breath sounds normal. No stridor. No respiratory distress. He has no decreased breath sounds. He has no wheezes. He has no rhonchi. He has no rales.  Abdominal: Soft. Normal appearance and bowel sounds are normal. He exhibits no distension. There is no tenderness. There is no rebound and no CVA tenderness.  Musculoskeletal: Normal range of motion. He exhibits no edema and no tenderness.  Neurological: He is alert and oriented to person, place, and time. He has normal strength. No cranial nerve deficit or sensory deficit. GCS eye subscore is 4. GCS verbal subscore is 5.  GCS motor subscore is 6.  Skin: Skin is warm and dry. No abrasion and no rash noted.  Psychiatric: His speech is normal and behavior is normal. His affect is blunt. Thought content is not paranoid and not delusional. He expresses no homicidal and no suicidal ideation. He expresses no suicidal plans.    ED Course  Procedures (including critical care time)  Labs Reviewed  CBC WITH DIFFERENTIAL  COMPREHENSIVE METABOLIC PANEL  URINE RAPID DRUG SCREEN (HOSP PERFORMED)  URINALYSIS, ROUTINE W REFLEX MICROSCOPIC  ETHANOL  PHENYTOIN LEVEL, TOTAL   No results found.   No diagnosis found.    MDM  patient to be medically cleared and to be seen by behavior health        Toy Baker, MD 04/01/12 2119

## 2012-04-02 ENCOUNTER — Encounter (HOSPITAL_COMMUNITY): Payer: Self-pay

## 2012-04-02 ENCOUNTER — Inpatient Hospital Stay (HOSPITAL_COMMUNITY)
Admission: EM | Admit: 2012-04-02 | Discharge: 2012-04-07 | DRG: 897 | Disposition: A | Discharge status: Home / Self Care | Payer: Federal, State, Local not specified - Other | Source: Intra-hospital | Attending: Psychiatry | Admitting: Psychiatry

## 2012-04-02 ENCOUNTER — Encounter (HOSPITAL_COMMUNITY): Payer: Self-pay | Admitting: *Deleted

## 2012-04-02 DIAGNOSIS — I4891 Unspecified atrial fibrillation: Secondary | ICD-10-CM | POA: Diagnosis present

## 2012-04-02 DIAGNOSIS — F10239 Alcohol dependence with withdrawal, unspecified: Principal | ICD-10-CM | POA: Diagnosis present

## 2012-04-02 DIAGNOSIS — Z79899 Other long term (current) drug therapy: Secondary | ICD-10-CM

## 2012-04-02 DIAGNOSIS — F329 Major depressive disorder, single episode, unspecified: Secondary | ICD-10-CM | POA: Diagnosis present

## 2012-04-02 DIAGNOSIS — I10 Essential (primary) hypertension: Secondary | ICD-10-CM | POA: Diagnosis present

## 2012-04-02 DIAGNOSIS — G609 Hereditary and idiopathic neuropathy, unspecified: Secondary | ICD-10-CM | POA: Diagnosis present

## 2012-04-02 DIAGNOSIS — F1020 Alcohol dependence, uncomplicated: Secondary | ICD-10-CM | POA: Diagnosis present

## 2012-04-02 DIAGNOSIS — F10939 Alcohol use, unspecified with withdrawal, unspecified: Principal | ICD-10-CM | POA: Diagnosis present

## 2012-04-02 MED ORDER — TRAZODONE HCL 50 MG PO TABS
50.0000 mg | ORAL_TABLET | Freq: Every evening | ORAL | Status: DC | PRN
  Administered 2012-04-02 – 2012-04-06 (×5): 50 mg via ORAL
  Filled 2012-04-02: qty 1
  Filled 2012-04-02: qty 14
  Filled 2012-04-02 (×4): qty 1

## 2012-04-02 MED ORDER — CHLORDIAZEPOXIDE HCL 25 MG PO CAPS
25.0000 mg | ORAL_CAPSULE | Freq: Every day | ORAL | Status: AC
  Administered 2012-04-05: 25 mg via ORAL
  Filled 2012-04-02: qty 1

## 2012-04-02 MED ORDER — ACETAMINOPHEN 325 MG PO TABS
650.0000 mg | ORAL_TABLET | Freq: Four times a day (QID) | ORAL | Status: DC | PRN
  Administered 2012-04-04: 650 mg via ORAL

## 2012-04-02 MED ORDER — CHLORDIAZEPOXIDE HCL 25 MG PO CAPS
25.0000 mg | ORAL_CAPSULE | Freq: Three times a day (TID) | ORAL | Status: AC
  Administered 2012-04-03 (×3): 25 mg via ORAL
  Filled 2012-04-02 (×3): qty 1

## 2012-04-02 MED ORDER — THIAMINE HCL 100 MG/ML IJ SOLN
100.0000 mg | Freq: Once | INTRAMUSCULAR | Status: AC
  Administered 2012-04-02: 100 mg via INTRAMUSCULAR

## 2012-04-02 MED ORDER — ONDANSETRON 4 MG PO TBDP: 4.0000 mg | ORAL_TABLET | Freq: Four times a day (QID) | ORAL | Status: AC | PRN

## 2012-04-02 MED ORDER — NICOTINE 21 MG/24HR TD PT24
21.0000 mg | MEDICATED_PATCH | Freq: Every day | TRANSDERMAL | Status: DC
  Administered 2012-04-02: 21 mg via TRANSDERMAL
  Filled 2012-04-02 (×4): qty 1

## 2012-04-02 MED ORDER — PHENYTOIN SODIUM EXTENDED 100 MG PO CAPS
300.0000 mg | ORAL_CAPSULE | Freq: Every day | ORAL | Status: DC
  Administered 2012-04-02: 300 mg via ORAL
  Filled 2012-04-02: qty 3

## 2012-04-02 MED ORDER — CHLORDIAZEPOXIDE HCL 25 MG PO CAPS
25.0000 mg | ORAL_CAPSULE | ORAL | Status: AC
  Administered 2012-04-04 (×2): 25 mg via ORAL
  Filled 2012-04-02 (×2): qty 1

## 2012-04-02 MED ORDER — MAGNESIUM HYDROXIDE 400 MG/5ML PO SUSP: 30.0000 mL | Freq: Every day | ORAL | Status: DC | PRN

## 2012-04-02 MED ORDER — VITAMIN B-1 100 MG PO TABS
100.0000 mg | ORAL_TABLET | Freq: Every day | ORAL | Status: DC
  Administered 2012-04-03 – 2012-04-07 (×5): 100 mg via ORAL
  Filled 2012-04-02 (×7): qty 1

## 2012-04-02 MED ORDER — LOPERAMIDE HCL 2 MG PO CAPS: 2.0000 mg | ORAL_CAPSULE | ORAL | Status: AC | PRN

## 2012-04-02 MED ORDER — CHLORDIAZEPOXIDE HCL 25 MG PO CAPS
25.0000 mg | ORAL_CAPSULE | Freq: Four times a day (QID) | ORAL | Status: AC
  Administered 2012-04-02 (×3): 25 mg via ORAL
  Filled 2012-04-02 (×3): qty 1

## 2012-04-02 MED ORDER — ALUM & MAG HYDROXIDE-SIMETH 200-200-20 MG/5ML PO SUSP: 30.0000 mL | ORAL | Status: DC | PRN

## 2012-04-02 MED ORDER — ASPIRIN EC 81 MG PO TBEC
81.0000 mg | DELAYED_RELEASE_TABLET | Freq: Every day | ORAL | Status: DC
  Administered 2012-04-02 – 2012-04-07 (×6): 81 mg via ORAL
  Filled 2012-04-02 (×2): qty 1
  Filled 2012-04-02: qty 14
  Filled 2012-04-02 (×5): qty 1

## 2012-04-02 MED ORDER — PHENYTOIN SODIUM EXTENDED 100 MG PO CAPS
300.0000 mg | ORAL_CAPSULE | Freq: Every day | ORAL | Status: DC
  Administered 2012-04-02 – 2012-04-07 (×6): 300 mg via ORAL
  Filled 2012-04-02 (×7): qty 3
  Filled 2012-04-02: qty 42

## 2012-04-02 MED ORDER — ADULT MULTIVITAMIN W/MINERALS CH
1.0000 | ORAL_TABLET | Freq: Every day | ORAL | Status: DC
  Administered 2012-04-02 – 2012-04-07 (×6): 1 via ORAL
  Filled 2012-04-02 (×8): qty 1

## 2012-04-02 MED ORDER — HYDROXYZINE HCL 25 MG PO TABS
25.0000 mg | ORAL_TABLET | Freq: Four times a day (QID) | ORAL | Status: AC | PRN
  Administered 2012-04-03: 25 mg via ORAL

## 2012-04-02 MED ORDER — CHLORDIAZEPOXIDE HCL 25 MG PO CAPS
25.0000 mg | ORAL_CAPSULE | Freq: Four times a day (QID) | ORAL | Status: AC | PRN
  Administered 2012-04-03 – 2012-04-04 (×2): 25 mg via ORAL
  Filled 2012-04-02 (×2): qty 1

## 2012-04-02 MED ORDER — CHLORDIAZEPOXIDE HCL 25 MG PO CAPS
50.0000 mg | ORAL_CAPSULE | Freq: Once | ORAL | Status: AC
  Administered 2012-04-02: 50 mg via ORAL
  Filled 2012-04-02: qty 2

## 2012-04-02 NOTE — [facility] Counselor (Signed)
Patient accepted by Donell Sievert, PA to Dr. Dub Mikes and will be going into 300-1.

## 2012-04-02 NOTE — Progress Notes (Signed)
[facility] LCSW Group Therapy  04/02/2012 2:21 PM  Type of Therapy:  Group Therapy  Participation Level:  Minimal  Participation Quality:  Appropriate and Attentive  Affect:  Blunted  Cognitive:  Alert and Oriented  Insight:  Developing/Improving  Engagement in Therapy:  Limited  Modes of Intervention:  Education, Exploration, Rapport Building and Support  Summary of Progress/Problems: Topic-Balance: The topic for group was balance in life. Pt participated in the discussion about when their life was in balance and out of balance and how this feels. Pt discussed ways to get back in balance and short term goals they can work on to get where they want to be.  Pt attended group and listened attentively.  He only spoke once prompted however, was very open with his disclosure.  Pt chose the card "One thing you could do to improve your life."  He stated that he has done this recently in separating from his girlfriend.  He reported that they engaged substance abuse behaviors together and that there was no way that they could be together and sober.  Pt showed insight saying that he has gone through this process long enough has come to the conclusion that "being with her and staying alive and sober is not possible." Pt shared that he relapsed on ETOH after being clean for 9 months.  He reports that he plans to move in with his mother following d/c in order to live in a clean environment.  Geoffry Bannister L 04/02/2012, 2:21 PM

## 2012-04-02 NOTE — Progress Notes (Addendum)
8:07am- original paperwork given to RN.  Per ACT, pt accepted at [facility] by Donell Sievert, PA to Dr. Dub Mikes ([phone]).  CSW completed support paperwork and faxed to [facility].  Pt signed consent to release for his mother, Leilani Able and also signed voluntary admission and consent for tx form.  CSW faxed pre-cert to [facility] and received a fax confirmation.  CSW made EDP and RN aware.  Vickii Penna, LCSWA [phone]  Clinical Social Work

## 2012-04-02 NOTE — [facility] Suicide Risk Assessment (Signed)
Suicide Risk Assessment  Admission Assessment     Nursing information obtained from:  Patient Demographic factors:  Male;Unemployed Current Mental Status:  NA Loss Factors:  Decrease in vocational status;Loss of significant relationship Historical Factors:  Family history of mental illness or substance abuse Risk Reduction Factors:  Sense of responsibility to family;Living with another person, especially a relative;Positive social support;Positive therapeutic relationship  CLINICAL FACTORS:   Alcohol/Substance Abuse/Dependencies  COGNITIVE FEATURES THAT CONTRIBUTE TO RISK:  Closed-mindedness Thought constriction (tunnel vision)    SUICIDE RISK:   Moderate:  Frequent suicidal ideation with limited intensity, and duration, some specificity in terms of plans, no associated intent, good self-control, limited dysphoria/symptomatology, some risk factors present, and identifiable protective factors, including available and accessible social support.  PLAN OF CARE: Supportive approach/coping skills/relapse prevention                              Address the co morbdities  I certify that inpatient services furnished can reasonably be expected to improve the patient's condition.  Bevelyn Arriola A 04/02/2012, 2:35 PM

## 2012-04-02 NOTE — H&P (Signed)
Psychiatric Admission Assessment Adult  Patient Identification:  Blake Arnold Date of Evaluation:  04/02/2012 Chief Complaint:  Alcohol Dependence History of Present Illness:: He was here a year ago. After he was discharged he went to prison for previous charges. He was there for 6 months. He got out Nov 12. First drink Christmas Day then February. Last two weeks a fifth of liquor a day plus.Staying with his mother. He is on parole.  Elements:  Location:  in patient. Quality:  Unable to function. Severity:  moderate to severe. Timing:  every day. Duration:  every day. Context:  alcohol dependent underlying depression. Associated Signs/Synptoms: Depression Symptoms:  depressed mood, suicidal thoughts without plan, anxiety, loss of energy/fatigue, disturbed sleep, (Hypo) Manic Symptoms:  Impulsivity, Labiality of Mood, Anxiety Symptoms:  Excessive Worry, Psychotic Symptoms:  Denies PTSD Symptoms: NA  Psychiatric Specialty Exam: Physical Exam  Review of Systems  Constitutional: Negative.   HENT: Positive for neck pain.   Eyes: Positive for blurred vision.  Respiratory:       Smoking one pack  Cardiovascular: Positive for chest pain and palpitations.  Gastrointestinal: Positive for nausea.  Genitourinary: Positive for urgency.  Musculoskeletal: Positive for back pain and joint pain.  Skin: Negative.   Neurological: Positive for tremors, seizures and headaches.  Endo/Heme/Allergies: Negative.   Psychiatric/Behavioral: Positive for suicidal ideas and substance abuse. The patient is nervous/anxious and has insomnia.     There were no vitals taken for this visit.There is no weight on file to calculate BMI.  General Appearance: Disheveled  Eye Solicitor::  Fair  Speech:  Clear and Coherent  Volume:  Normal  Mood:  Anxious, Depressed and worried  Affect:  Appropriate  Thought Process:  Coherent and Goal Directed  Orientation:  Full (Time, Place, and Person)  Thought  Content:  worries, concerns  Suicidal Thoughts:  Yes.  without intent/plan  Homicidal Thoughts:  No  Memory:  Immediate;   Fair Recent;   Fair Remote;   Fair  Judgement:  Fair  Insight:  superficial  Psychomotor Activity:  Restlessness  Concentration:  Fair  Recall:  Fair  Akathisia:  No  Handed:  Right  AIMS (if indicated):     Assets:  Desire for Improvement  Sleep:       Past Psychiatric History: Diagnosis:  Hospitalizations: [facility] for detox  Outpatient Care: [facility]  Substance Abuse Care: [facility], [facility], [facility]  Self-Mutilation:  Suicidal Attempts:  Violent Behaviors:   Past Medical History:   Past Medical History  Diagnosis Date  . Hepatitis C   . Chronic back pain   . A-fib   . Depression   . Anxiety   . Hepatitis C     history of  . Chronic back pain   . Acid reflux   . History of urinary frequency   . Peripheral neuropathy     hands and feet  . Hypertension   . Polysubstance abuse 01/25/2011  . Alcohol abuse   . Seizures    Seizure History:  withdrawal but also not alcohol related Allergies:  No Known Allergies PTA Medications: Prescriptions prior to admission  Medication Sig Dispense Refill  . aspirin EC 81 MG tablet Take 81 mg by mouth daily.      . phenytoin (DILANTIN) 100 MG ER capsule Take 300 mg by mouth daily.         Previous Psychotropic Medications:  Medication/Dose  Prozac, Zoloft, Neurontin  Substance Abuse History in the last 12 months:  yes  Consequences of Substance Abuse: Medical Consequences:  seizures Legal Consequences:  DWI, Braking and entering Family Consequences:  separated Blackouts:   Withdrawal Symptoms:   Nausea Tremors Vomiting seeing things  Social History:  reports that he has been smoking Cigarettes.  He has a 25 pack-year smoking history. He has never used smokeless tobacco. He reports that  drinks alcohol. He reports that he uses illicit drugs ("Crack" cocaine and  Cocaine). Additional Social History:                      Current Place of Residence:  Living with mother Place of Birth:   Family Members: Marital Status:  Single Children:  Sons:  Daughters: Relationships: Education:  10 th grade, then alcohol Educational Problems/Performance: Religious Beliefs/Practices: History of Abuse (Emotional/Phsycial/Sexual) Occupational Experiences; Community education officer History:  None. Legal History: Multiple charges Hobbies/Interests:  Family History:  No family history on file. Alcohol and Drugs, Depression, Anxiety  Results for orders placed during the hospital encounter of 04/01/12 (from the past 72 hour(s))  URINE RAPID DRUG SCREEN (HOSP PERFORMED)     Status: None   Collection Time    04/01/12  8:39 PM      Result Value Range   Opiates NONE DETECTED  NONE DETECTED   Cocaine NONE DETECTED  NONE DETECTED   Benzodiazepines NONE DETECTED  NONE DETECTED   Amphetamines NONE DETECTED  NONE DETECTED   Tetrahydrocannabinol NONE DETECTED  NONE DETECTED   Barbiturates NONE DETECTED  NONE DETECTED   Comment:            DRUG SCREEN FOR MEDICAL PURPOSES     ONLY.  IF CONFIRMATION IS NEEDED     FOR ANY PURPOSE, NOTIFY LAB     WITHIN 5 DAYS.                LOWEST DETECTABLE LIMITS     FOR URINE DRUG SCREEN     Drug Class       Cutoff (ng/mL)     Amphetamine      1000     Barbiturate      200     Benzodiazepine   200     Tricyclics       300     Opiates          300     Cocaine          300     THC              50  URINALYSIS, ROUTINE W REFLEX MICROSCOPIC     Status: None   Collection Time    04/01/12  8:39 PM      Result Value Range   Color, Urine YELLOW  YELLOW   APPearance CLEAR  CLEAR   Specific Gravity, Urine 1.005  1.005 - 1.030   pH 6.0  5.0 - 8.0   Glucose, UA NEGATIVE  NEGATIVE mg/dL   Hgb urine dipstick NEGATIVE  NEGATIVE   Bilirubin Urine NEGATIVE  NEGATIVE   Ketones, ur NEGATIVE  NEGATIVE mg/dL   Protein, ur NEGATIVE   NEGATIVE mg/dL   Urobilinogen, UA 0.2  0.0 - 1.0 mg/dL   Nitrite NEGATIVE  NEGATIVE   Leukocytes, UA NEGATIVE  NEGATIVE   Comment: MICROSCOPIC NOT DONE ON URINES WITH NEGATIVE PROTEIN, BLOOD, LEUKOCYTES, NITRITE, OR GLUCOSE <1000 mg/dL.  CBC WITH DIFFERENTIAL     Status: Abnormal  Collection Time    04/01/12  9:00 PM      Result Value Range   WBC 11.0 (*) 4.0 - 10.5 K/uL   RBC 5.29  4.22 - 5.81 MIL/uL   Hemoglobin 16.5  13.0 - 17.0 g/dL   HCT 96.0  45.4 - 09.8 %   MCV 88.1  78.0 - 100.0 fL   MCH 31.2  26.0 - 34.0 pg   MCHC 35.4  30.0 - 36.0 g/dL   RDW 11.9  14.7 - 82.9 %   Platelets 219  150 - 400 K/uL   Neutrophils Relative 62  43 - 77 %   Neutro Abs 6.8  1.7 - 7.7 K/uL   Lymphocytes Relative 30  12 - 46 %   Lymphs Abs 3.3  0.7 - 4.0 K/uL   Monocytes Relative 6  3 - 12 %   Monocytes Absolute 0.7  0.1 - 1.0 K/uL   Eosinophils Relative 1  0 - 5 %   Eosinophils Absolute 0.2  0.0 - 0.7 K/uL   Basophils Relative 1  0 - 1 %   Basophils Absolute 0.1  0.0 - 0.1 K/uL  COMPREHENSIVE METABOLIC PANEL     Status: None   Collection Time    04/01/12  9:00 PM      Result Value Range   Sodium 139  135 - 145 mEq/L   Potassium 3.8  3.5 - 5.1 mEq/L   Chloride 101  96 - 112 mEq/L   CO2 24  19 - 32 mEq/L   Glucose, Bld 82  70 - 99 mg/dL   BUN 10  6 - 23 mg/dL   Creatinine, Ser 5.62  0.50 - 1.35 mg/dL   Calcium 8.9  8.4 - 13.0 mg/dL   Total Protein 7.5  6.0 - 8.3 g/dL   Albumin 3.7  3.5 - 5.2 g/dL   AST 24  0 - 37 U/L   ALT 31  0 - 53 U/L   Alkaline Phosphatase 72  39 - 117 U/L   Total Bilirubin 0.3  0.3 - 1.2 mg/dL   GFR calc non Af Amer >90  >90 mL/min   GFR calc Af Amer >90  >90 mL/min   Comment:            The eGFR has been calculated     using the CKD EPI equation.     This calculation has not been     validated in all clinical     situations.     eGFR's persistently     <90 mL/min signify     possible Chronic Kidney Disease.  ETHANOL     Status: Abnormal   Collection Time     04/01/12  9:00 PM      Result Value Range   Alcohol, Ethyl (B) 58 (*) 0 - 11 mg/dL   Comment:            LOWEST DETECTABLE LIMIT FOR     SERUM ALCOHOL IS 11 mg/dL     FOR MEDICAL PURPOSES ONLY  PHENYTOIN LEVEL, TOTAL     Status: Abnormal   Collection Time    04/01/12  9:00 PM      Result Value Range   Phenytoin Lvl <2.5 (*) 10.0 - 20.0 ug/mL   Psychological Evaluations:  Assessment:   AXIS I:  Alcohol Dependence/Withdrawal, Depressive Disorder NOS AXIS II:  Deferred AXIS III:   Past Medical History  Diagnosis Date  . Hepatitis C   .  Chronic back pain   . A-fib   . Depression   . Anxiety   . Hepatitis C     history of  . Chronic back pain   . Acid reflux   . History of urinary frequency   . Peripheral neuropathy     hands and feet  . Hypertension   . Polysubstance abuse 01/25/2011  . Alcohol abuse   . Seizures    AXIS IV:  economic problems, housing problems, occupational problems and problems related to legal system/crime AXIS V:  51-60 moderate symptoms  Treatment Plan/Recommendations:  Supportive approach/coping skills/relapse prevention                                                                 [facility] Detox/reassess co morbidities  Treatment Plan Summary: Daily contact with patient to assess and evaluate symptoms and progress in treatment Medication management Current Medications:  No current facility-administered medications for this encounter.    Observation Level/Precautions:  Detox  Laboratory:  AS per the ED  Psychotherapy:  Individual/group  Medications:  [facility] Detox, reassess co morbidites  Consultations:    Discharge Concerns:    Estimated LOS: 7 days  Other:     I certify that inpatient services furnished can reasonably be expected to improve the patient's condition.   Kimori Tartaglia A 2/20/20149:33 AM

## 2012-04-02 NOTE — Progress Notes (Signed)
Recreation Therapy Notes   Date: 02.20.2014  Time: 3:00pm     Group Topic/Focus: Leisure Education  Participation Level: Active  Participation Quality: Appropriate  Affect: Flat  Cognitive: Appropriate   Additional Comments: Patient with peers played On Deck, a card game combining charades and pictionary. Patient successfully acted out or drew leisure and recreation activities for peers to guess. Patient participated in group discussion regarding using positive recreation and leisure as a coping mechanism post discharge.  Marykay Lex Sarahjane Matherly, LRT/CTRS       Nikiah Goin L 04/02/2012 4:01 PM

## 2012-04-02 NOTE — ED Notes (Signed)
Spoke to Lab about Phenytoin level, stated it should be running at [facility] at this time.

## 2012-04-02 NOTE — Progress Notes (Signed)
D   Pt has been appropriate and pleasant   He attended group and has interacted appropriately with others  Pt complains of some tremors and mild anxiety and problems sleeping  A   Verbal support given  Medications administered and effectiveness monitored   Q 15 min checks R   Pt safe at present

## 2012-04-02 NOTE — Tx Team (Signed)
Initial Interdisciplinary Treatment Plan  PATIENT STRENGTHS: (choose at least two) Ability for insight Communication skills General fund of knowledge Motivation for treatment/growth  PATIENT STRESSORS: Marital or family conflict Medication change or noncompliance Occupational concerns Substance abuse   PROBLEM LIST: Problem List/Patient Goals Date to be addressed Date deferred Reason deferred Estimated date of resolution  Substance Abuse      Depression                                                 DISCHARGE CRITERIA:  Improved stabilization in mood, thinking, and/or behavior Need for constant or close observation no longer present Verbal commitment to aftercare and medication compliance  PRELIMINARY DISCHARGE PLAN: Attend aftercare/continuing care group  PATIENT/FAMIILY INVOLVEMENT: This treatment plan has been presented to and reviewed with the patient, Blake Arnold, and/or family member, .  The patient and family have been given the opportunity to ask questions and make suggestions.  [name] 04/02/2012, 10:11 AM

## 2012-04-02 NOTE — BH Assessment (Signed)
Assessment Note   Blake Arnold is a 42 y.o. male presenting [facility] for [facility] from alcohol.  Pt denies SI/HI/Psych.  Pt consumes 1/5 of Vodka and 1-40oz beer, daily, last intake was 04/01/12.  Pt says he 's been drinking heavily x65month.  Pt told this Clinical research associate that he purposely broke the law so that he could be incarcerated in order to receive treatment for his alcoholism.  Pt says he's been out of prison for 4 mos and has been drinking since his release.  Pt c/o w/d sxs: nausea, headache and mild hallucinations(says he thought he say bugs crawling on the wall).  Pt suffers from seizures due to withdrawals and blackouts, last seizure was 1 wk ago. Pt says he is prescribed dilantin but doesn't take it because he can't afford it.  Pt has inpt hx with [facility], [facility], [facility], [facility], [facility](2004-2012).  Pt has current legal charges, however on probation for B&E and Larceny.  This Clinical research associate contacted [facility] regarding possible inpt admission for pt, per Dennie Bible, sue to seizure w/d hx and no meds, pt will not be accepted for detox treatment.    Axis I: Alcohol Dependence  Axis II: Deferred Axis III:  Past Medical History  Diagnosis Date  . Hepatitis C   . Chronic back pain   . A-fib   . Depression   . Anxiety   . Hepatitis C     history of  . Chronic back pain   . Acid reflux   . History of urinary frequency   . Peripheral neuropathy     hands and feet  . Hypertension   . Polysubstance abuse 01/25/2011  . Alcohol abuse   . Seizures    Axis IV: other psychosocial or environmental problems, problems related to legal system/crime, problems related to social environment and problems with primary support group Axis V: 51-60 moderate symptoms  Past Medical History:  Past Medical History  Diagnosis Date  . Hepatitis C   . Chronic back pain   . A-fib   . Depression   . Anxiety   . Hepatitis C     history of  . Chronic back pain   . Acid reflux   . History of urinary frequency   . Peripheral  neuropathy     hands and feet  . Hypertension   . Polysubstance abuse 01/25/2011  . Alcohol abuse   . Seizures     Past Surgical History  Procedure Laterality Date  . Cardioversion  03/07/2006    Family History: History reviewed. No pertinent family history.  Social History:  reports that he has been smoking Cigarettes.  He has a 25 pack-year smoking history. He has never used smokeless tobacco. He reports that  drinks alcohol. He reports that he uses illicit drugs ("Crack" cocaine and Cocaine).  Additional Social History:  Alcohol / Drug Use Pain Medications: See MAR  Prescriptions: See MAR  Over the Counter: See MAR History of alcohol / drug use?: Yes Longest period of sobriety (when/how long): Only when in detox treatment Negative Consequences of Use: Personal relationships;Legal;Work / School;Financial Withdrawal Symptoms: Nausea / Vomiting (Headache; Hallucinations ) Substance #1 Name of Substance 1: Alcohol--Vodka; Beer  1 - Age of First Use: 14 YOM  1 - Amount (size/oz): 1/5 Vodka; 1-40oz Beer 1 - Frequency: Daily  1 - Duration: On-going  1 - Last Use / Amount: 04/01/12  CIWA: CIWA-Ar BP: 135/83 mmHg Pulse Rate: 83 Nausea and Vomiting: mild nausea with no vomiting  Tactile Disturbances: none Tremor: no tremor Auditory Disturbances: not present Paroxysmal Sweats: no sweat visible Visual Disturbances: very mild sensitivity Anxiety: no anxiety, at ease Headache, Fullness in Head: mild Agitation: normal activity Orientation and Clouding of Sensorium: oriented and can do serial additions CIWA-Ar Total: 4 COWS:    Allergies: No Known Allergies  Home Medications:  (Not in a hospital admission)  OB/GYN Status:  No LMP for male patient.  General Assessment Data [facility] of Assessment: [facility] Living Arrangements: Alone Can pt return to current living arrangement?: Yes Admission Status: Voluntary Is patient capable of signing voluntary admission?: Yes Transfer  from: Acute Hospital Referral Source: MD  Education Status Is patient currently in school?: No Current Grade: None  Highest grade of school patient has completed: None  Name of school: None  Contact person: None   Risk to self Suicidal Ideation: No Suicidal Intent: No Is patient at risk for suicide?: No Suicidal Plan?: No Access to Means: No What has been your use of drugs/alcohol within the last 12 months?: Abusing: alcohol  Previous Attempts/Gestures: No How many times?: 0 Other Self Harm Risks: None  Triggers for Past Attempts: None known Intentional Self Injurious Behavior: None Family Suicide History: No Recent stressful life event(s): Other (Comment);Legal Issues (Chronic SA) Persecutory voices/beliefs?: No Depression: Yes Depression Symptoms: Loss of interest in usual pleasures Substance abuse history and/or treatment for substance abuse?: Yes Suicide prevention information given to non-admitted patients: Not applicable  Risk to Others Homicidal Ideation: No Thoughts of Harm to Others: No Current Homicidal Intent: No Current Homicidal Plan: No Access to Homicidal Means: No Identified Victim: None  History of harm to others?: No Assessment of Violence: None Noted Violent Behavior Description: None  Does patient have access to weapons?: No Criminal Charges Pending?: No Does patient have a court date: No  Psychosis Hallucinations: Visual Delusions: None noted  Mental Status Report Appear/Hygiene: Disheveled Eye Contact: Fair Motor Activity: Unremarkable Speech: Logical/coherent Level of Consciousness: Alert Mood:  (Appropriate ) Affect: Appropriate to circumstance Anxiety Level: None Thought Processes: Coherent;Relevant Judgement: Unimpaired Orientation: Person;Place;Time;Situation Obsessive Compulsive Thoughts/Behaviors: None  Cognitive Functioning Concentration: Normal Memory: Recent Intact;Remote Intact IQ: Average Insight: Fair Impulse  Control: Fair Appetite: Good Weight Loss: 0 Weight Gain: 0 Sleep: No Change Total Hours of Sleep: 8 Vegetative Symptoms: None  ADLScreening [facility] Assessment Services) Patient's cognitive ability adequate to safely complete daily activities?: Yes Patient able to express need for assistance with ADLs?: Yes Independently performs ADLs?: Yes (appropriate for developmental age)  Abuse/Neglect [facility]) Physical Abuse: Denies Verbal Abuse: Denies Sexual Abuse: Denies  Prior Inpatient Therapy Prior Inpatient Therapy: Yes Prior Therapy Dates: 2004-2012 Prior Therapy Facilty/Provider(s): [facility], [facility], [facility], [facility], [facility] Reason for Treatment: Detox/Rehab  Prior Outpatient Therapy Prior Outpatient Therapy: No Prior Therapy Dates: None  Prior Therapy Facilty/Provider(s): None  Reason for Treatment: None   ADL Screening (condition at time of admission) Patient's cognitive ability adequate to safely complete daily activities?: Yes Patient able to express need for assistance with ADLs?: Yes Independently performs ADLs?: Yes (appropriate for developmental age) Weakness of Legs: None Weakness of Arms/Hands: None  Home Assistive Devices/Equipment Home Assistive Devices/Equipment: None  Therapy Consults (therapy consults require a physician order) PT Evaluation Needed: No OT Evalulation Needed: No SLP Evaluation Needed: No Abuse/Neglect Assessment (Assessment to be complete while patient is alone) Physical Abuse: Denies Verbal Abuse: Denies Sexual Abuse: Denies Exploitation of patient/patient's resources: Denies Self-Neglect: Denies Values / Beliefs Cultural Requests During Hospitalization: None Spiritual Requests During  Hospitalization: None Consults Spiritual Care Consult Needed: No Social Work Consult Needed: No Merchant navy officer (For Healthcare) Advance Directive: Patient does not have advance directive;Patient would not like information Pre-existing out of facility DNR order  (yellow form or pink MOST form): No Nutrition Screen- [facility] Patient's home diet: Regular Have you recently lost weight without trying?: No Have you been eating poorly because of a decreased appetite?: No Malnutrition Screening Tool Score: 0  Additional Information 1:1 In Past 12 Months?: No CIRT Risk: No Elopement Risk: No Does patient have medical clearance?: Yes     Disposition:  Disposition Disposition of Patient: Inpatient treatment program;Referred to [facility] ) Type of inpatient treatment program: Adult Patient referred to: Other (Comment) [facility] )  On Site Evaluation by:   Reviewed with Physician:     Murrell Redden 04/02/2012 2:31 AM

## 2012-04-02 NOTE — Progress Notes (Signed)
Patient appropriate and cooperative during admission assessment. Patient states 'I am here because I had been clean and sober for 10 months until about a month ago when I started drinking and using drugs." Patient verbalizes "I started catching myself getting up and drinking first thing in the morning and I knew I needed to stop." Patient verbalizes "I was drinking up to one half gallon of liquor a day, I was also using cocaine, pain medications and lyrica without a prescription." Patient states he was recently "in prison for 6 months for breaking and entering and strong arm robbery." Patient verbalizes "now I am on probation and I am stressed out because I cannot find a job." Patient states "I have not been taking my seizure medication because I cannot afford it and I feel like it causes bad side effects." Patient verbalizes he had a seizure "about a week and a half ago." Patient states an additional stressor is he recently broke up with girlfriend of 13 years. Patient would like to have long term treatment if possible. Patient had been living with sister who is "addicted to pain medications," patient plans to live with mother once discharged from [facility] Patient denies SI, HI and AVH. Patient advised of increased risk for falls r/t seizure history, patient verbalizes understanding. Patient oriented to room/unit/staff. Patient verbalizes no questions/concerns at this time. Patient safe on unit with Q15 minute checks for safety. Will continue to monitor.

## 2012-04-02 NOTE — ED Provider Notes (Addendum)
Patient sleeping on AM rounds.  Filed Vitals:   04/02/12 0216  BP: 135/83  Pulse: 83  Temp:   Resp:    Placement pending  Gerhard Munch, MD 04/02/12 0755  7:57 AM Patient accepted to [facility] - Dr. Franchot Gallo, MD 04/02/12 [phone]

## 2012-04-03 NOTE — Progress Notes (Signed)
[facility] LCSW Group Therapy  04/03/2012 2:49 PM  Type of Therapy:  Group Therapy  Participation Level:  Active  Participation Quality:  Appropriate and Attentive  Affect:  Blunted, Depressed and Flat  Cognitive:  Alert, Appropriate and Oriented  Insight:  Limited  Engagement in Therapy:  Engaged  Modes of Intervention:  Discussion, Education, Problem-solving and Support  Summary of Progress/Problems:RELAPSE: The topic for today was feelings about relapse. Pt discussed what relapse prevention is to them and identified triggers that they are on the path to relapse. Pt processed their feeling towards relapse and was able to relate to peers. Pt discussed coping skills that can be used for relapse prevention.    Pt attended d/c planning group.  He shared that his perception of relapse is to go back to using substances.  He identified being around family members and friends that use as a trigger.  He also processed that a desire to numb feelings of anger and resentment as a trigger for relapse.  Pt processed that he has a strong connection to his family however, he needs to distance himself from them in order to stay on the path to recovery.  Pt identified a lack of outside activities like work or hobbies an excess free time as a contributor to his relapse.  SW encouraged pt to think of productive ways to utilize his time to strengthen his changes to stay sober.   Denita Lun L 04/03/2012, 2:49 PM

## 2012-04-03 NOTE — Progress Notes (Signed)
  D) Patient pleasant and cooperative upon my assessment. Patient completed Patient Self Inventory, reports slept "okay," and  appetite is "good." Patient rates depression as   4/10, patient rates hopeless feelings as 6 /10. Patient endorses passive SI, contracts verbally for safety with staff. Patient denies HI, denies A/V hallucinations.   A) Patient offered support and encouragement, patient encouraged to discuss feelings/concerns with staff. Patient verbalized understanding. Patient monitored Q15 minutes for safety. Patient met with MD  to discuss today's goals and plan of care.  R) Patient visible in milieu, attending groups in day room and meals in dining room. Patient appropriate with staff and peers.   Patient taking medications as ordered. Patient has a plan to "not drink" moving forward. Will continue to monitor.
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
## 2012-04-03 NOTE — Progress Notes (Signed)
Adult Psychoeducational Group Note  Date:  04/03/2012 Time:  9:26 PM  Group Topic/Focus:  AA group  Participation Level:  Minimal  Participation Quality:  Appropriate  Affect:  Appropriate  Cognitive:  Alert  Insight: Appropriate  Engagement in Group:  Improving  Modes of Intervention:  Support  Additional Comments:    Flonnie Hailstone 04/03/2012, 9:26 PM

## 2012-04-03 NOTE — Progress Notes (Signed)
Patient did attend the evening karaoke group.  

## 2012-04-03 NOTE — Treatment Plan (Signed)
Interdisciplinary Treatment Plan Update  Date Reviewed: 04/03/2012  Time Reviewed: 10:39 AM  Progress in Treatment:  Attending groups: Yes  Participating in groups: Yes  Taking medication as prescribed: Yes  Tolerating medication: Yes  Family/Significant other contact made: Not yet  Patient understands diagnosis: Yes, as evidenced by seeking detox from alcohol. Discussing patient identified problems/goals with staff: Yes, in aftercare planning group.   Medical problems stabilized or resolved: Yes  Denies suicidal/homicidal ideation: Yes, in aftercare planning group, upon admission, Blake Arnold did not voice SI. Patient has not harmed self or others: Yes  For review of initial/current patient goals, please see plan of care.  Estimated Length of Stay: 3-5 days  Reasons for Continued Hospitalization:  Detox Questionable medication trial for depression Development of comprehensive aftercare plan--explores aftercare treatment. New Problems/Goals identified: n/a Discharge Plan or Barriers: no plan in place at this. Researching referrals for long-term treatment.  Additional Comments: Blake Arnold has long history of i/p rehab admissions: [facility], [facility], [facility], [facility], [facility] and was recently released from prison approximately  four months ago, where he quickly relapsed. Blake Arnold is experiencing extreme difficulties in finding work due to felony record and is interested in long-term treatment outside of [place] if possible.  Attendees:  Signature:  Geoffery Lyons, MD 04/03/2012 10:39 AM   Signature:    Signature: Brittney RN 04/03/2012 10:39 AM  Signature:       Signature:   Signature: Herbert Seta Venancio Chenier MSW Intern 04/03/2012 10:39 AM  Signature: Foye Clock MSW Intern  04/03/2012 10:39AM  Signature:    Signature:    Signature:    Signature:    Signature:    Scribe for Treatment Team:  Trula Slade, 04/03/2012 10:39 AM

## 2012-04-03 NOTE — Progress Notes (Signed)
D.  Pt pleasant and appropriate on approach, denies complaints other than insomnia and some anxiety.  Pt was positive for evening AA group.  Interacting appropriately within the milieu.  Denies SI/HI/hallucinations at this time.  A.  Support and encouragement offered, medications given as ordered  R.  Will continue to monitor.

## 2012-04-03 NOTE — Progress Notes (Signed)
[facility] LCSW Aftercare Discharge Planning Group Note  04/03/2012 9:36 AM  Participation Quality:  Appropriate  Affect:  Depressed  Cognitive:  Appropriate  Insight:  Engaged  Engagement in Group:  Engaged  Modes of Intervention:  Confrontation, Discussion, Problem-solving, Socialization and Support  Summary of Progress/Problems: Blake Arnold explained that he is ready for long-term treatment for his alcohol addiction and would like to go as far from [facility] as possible. His family are addicts as well and he feels that they are not good for him to be around (they live in [place]). He rated depression as 3, anxiety as 5, hopelessness is "high." Pt feels that it is impossible for him to get a job since he has a felony on his record. Blake Arnold also expressed that he feels passive SI but can contract for safety.   Blake Arnold, Blake Arnold 04/03/2012, 9:36 AM
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
## 2012-04-03 NOTE — Progress Notes (Signed)
Adult Psychoeducational Group Note  Date:  04/03/2012 Time:  10:48 AM  Group Topic/Focus:  Relapse Prevention Planning:   The focus of this group is to define relapse and discuss the need for planning to combat relapse.  Participation Level:  Active  Participation Quality:  Attentive  Affect:  Appropriate  Cognitive:  Alert  Insight: Good  Engagement in Group:  Engaged  Modes of Intervention:  Discussion, Education and Support  Additional Comments:  Pts goal for today is to get to know self better. Pt states he came in for recovery from alcohol and depression.  Blake Arnold T 04/03/2012, 10:48 AM

## 2012-04-04 LAB — PHENYTOIN LEVEL, TOTAL: Phenytoin Lvl: 4.4 ug/mL — ABNORMAL LOW (ref 10.0–20.0)

## 2012-04-04 MED ORDER — FLUOXETINE HCL 20 MG PO CAPS
20.0000 mg | ORAL_CAPSULE | Freq: Every day | ORAL | Status: DC
  Administered 2012-04-04 – 2012-04-07 (×4): 20 mg via ORAL
  Filled 2012-04-04 (×5): qty 1
  Filled 2012-04-04: qty 14
  Filled 2012-04-04 (×3): qty 1

## 2012-04-04 NOTE — Progress Notes (Signed)
Patient did attend the evening speaker AA meeting.  

## 2012-04-04 NOTE — Progress Notes (Addendum)
Patient ID: Blake Arnold, male   DOB: Oct 12, 1970, 42 y.o.   MRN: [id number] D: Pt is awake and active on the unit this AM. Pt endorses passive SI but he is able to contract for safety. Pt is participating in the milieu with encouragement and is cooperative with staff. Pt rates their depression at 4 and hopelessness at 6. Pt's most recent CIWA score was 14. Writer administered PRN medications. Pt writes that he wishes to stay sober after discharge, but he insists on being admitted to a long term facility rather than attend AA meetings independently. Writer encouraged pt to participate with AA and reminded pt that many long term programs have a recovery piece that is required. Pt seems to avoid taking responsibility for his own sobriety by stating that "nothing works" and has excuses as to why he cannot stay sober.   A: Writer utilized therapeutic communication, encouraged pt to discuss feelings with staff and administered medication per MD orders. Writer also encouraged pt to attend groups.  R: Pt is attending groups and tolerating medications well. Writer will continue to monitor. 15 minute checks are ongoing for safety.

## 2012-04-04 NOTE — Clinical Social Work Note (Signed)
[facility] Group Notes:  (Clinical Social Work)  04/04/2012     10-11AM  Summary of Progress/Problems:   The main focus of today's process group was for the patient to identify ways in which they have in the past sabotaged their own recovery. Motivational Interviewing was utilized to ask the group members what they get out of their substance use, and what they want to change.  The Stages of Change were explained, and members identified where they currently are with regard to stages of change.  The patient did not join the group until toward the end, and expressed that he drinks alcohol to take away his pain.  He had 10 months clean recently, but later admitted this is because he was in jail 3 months followed by prison 6 months.  He is going to an outpatient program called [facility]" and states he wants long-term rehab now.  He stated "I'd rather be dead than drinking."  Type of Therapy:  Group Therapy - Process   Participation Level:  Active  Participation Quality:  Attentive and Sharing  Affect:  Blunted and Irritable  Cognitive:  Oriented  Insight:  Engaged  Engagement in Therapy:  Engaged  Modes of Intervention:  Education, Teacher, English as a foreign language, Exploration, Discussion, Motivational Interviewing   Ambrose Mantle, LCSW 04/04/2012, 12:10 PM

## 2012-04-04 NOTE — Progress Notes (Signed)
[facility] Group Notes:  (Nursing/MHT/Case Management/Adjunct)  Date:  04/04/2012  Time:  4:14 PM  Type of Therapy:  Group Therapy  Participation Level:  Did Not Attend  Participation Quality:  Did not Attend  Affect:  Did not Attend  Cognitive:  Did not Attend  Insight:  None  Engagement in Group:  Did not Attend  Modes of Intervention:  Did not Attend  Summary of Progress/Problems: Pt. Was resting in bed and did not attend.  Sondra Come 04/04/2012, 4:14 PM

## 2012-04-04 NOTE — Progress Notes (Signed)
[facility], [place] MD Progress Note  04/04/2012 1:01 PM Blake Arnold  MRN:  [id number] Subjective:  Blake Arnold reports that overall he is doing well. He denies any real withdrawal symptoms, but comments that if he was to leave today he would likely drink. He endorses some anxiety and depression, and rates them both as a 4 on a scale of 1-10 where 10 is the worst. He denies any suicidal or homicidal ideation. He denies any auditory or visual hallucinations. He reports that prior to going to jail he was on Prozac, and he would like to resume that as he felt it helped his depression.  Diagnosis:   Axis I: Alcohol Dependence, Withdrawal, Major Depression, Mood Disorder NOS Axis II: Deferred Axis III:  Past Medical History  Diagnosis Date  . Hepatitis C   . Chronic back pain   . A-fib   . Depression   . Anxiety   . Hepatitis C     history of  . Chronic back pain   . Acid reflux   . History of urinary frequency   . Peripheral neuropathy     hands and feet  . Hypertension   . Polysubstance abuse 01/25/2011  . Alcohol abuse   . Seizures     ADL's:  Intact  Sleep: Good  Appetite:  Good  Suicidal Ideation:  Patient denies any thought, plan, or intent Homicidal Ideation:  Patient denies any thought, plan, or intent AEB (as evidenced by):  Psychiatric Specialty Exam: Review of Systems  Constitutional: Negative.   HENT: Negative.   Eyes: Negative.   Respiratory: Negative.   Gastrointestinal: Negative.   Genitourinary: Negative.   Musculoskeletal: Negative.   Skin: Negative.   Neurological: Positive for seizures.  Psychiatric/Behavioral: Positive for depression and substance abuse. Negative for suicidal ideas and hallucinations. The patient is nervous/anxious. The patient does not have insomnia.     Blood pressure 128/76, pulse 98, temperature 97.6 F (36.4 C), temperature source Oral, resp. rate 18, height 5\' 7"  (1.702 m), weight 88.451 kg (195 lb), SpO2 97.00%.Body mass index is 30.53  kg/(m^2).  General Appearance: Disheveled  Eye Contact::  Minimal  Speech:  Clear and Coherent  Volume:  Normal  Mood:  Anxious and Dysphoric  Affect:  Congruent  Thought Process:  Tangential  Orientation:  Full (Time, Place, and Person)  Thought Content:  WDL  Suicidal Thoughts:  No  Homicidal Thoughts:  No  Memory:  Immediate;   Good Recent;   Good Remote;   Fair  Judgement:  Fair  Insight:  Fair  Psychomotor Activity:  Normal  Concentration:  Good  Recall:  Good  Akathisia:  No  Handed:  Right  AIMS (if indicated):     Assets:  Communication Skills Desire for Improvement  Sleep:  Number of Hours: 6.5   Current Medications: Current Facility-Administered Medications  Medication Dose Route Frequency Provider Last Rate Last Dose  . acetaminophen (TYLENOL) tablet 650 mg  650 mg Oral Q6H PRN Sanjuana Kava, NP   650 mg at 04/04/12 1044  . alum & mag hydroxide-simeth (MAALOX/MYLANTA) 200-200-20 MG/5ML suspension 30 mL  30 mL Oral Q4H PRN Sanjuana Kava, NP      . aspirin EC tablet 81 mg  81 mg Oral Daily Sanjuana Kava, NP   81 mg at 04/04/12 0845  . chlordiazePOXIDE (LIBRIUM) capsule 25 mg  25 mg Oral Q6H PRN Sanjuana Kava, NP   25 mg at 04/04/12 1044  . chlordiazePOXIDE (LIBRIUM) capsule 25  mg  25 mg Oral BH-qamhs Sanjuana Kava, NP   25 mg at 04/04/12 0845   Followed by  . [START ON 04/05/2012] chlordiazePOXIDE (LIBRIUM) capsule 25 mg  25 mg Oral Daily Sanjuana Kava, NP      . hydrOXYzine (ATARAX/VISTARIL) tablet 25 mg  25 mg Oral Q6H PRN Sanjuana Kava, NP   25 mg at 04/03/12 2134  . loperamide (IMODIUM) capsule 2-4 mg  2-4 mg Oral PRN Sanjuana Kava, NP      . magnesium hydroxide (MILK OF MAGNESIA) suspension 30 mL  30 mL Oral Daily PRN Sanjuana Kava, NP      . multivitamin with minerals tablet 1 tablet  1 tablet Oral Daily Sanjuana Kava, NP   1 tablet at 04/04/12 0845  . ondansetron (ZOFRAN-ODT) disintegrating tablet 4 mg  4 mg Oral Q6H PRN Sanjuana Kava, NP      . phenytoin  (DILANTIN) ER capsule 300 mg  300 mg Oral Daily Sanjuana Kava, NP   300 mg at 04/04/12 0845  . thiamine (VITAMIN B-1) tablet 100 mg  100 mg Oral Daily Sanjuana Kava, NP   100 mg at 04/04/12 0845  . traZODone (DESYREL) tablet 50 mg  50 mg Oral QHS PRN Sanjuana Kava, NP   50 mg at 04/03/12 2134    Lab Results: No results found for this or any previous visit (from the past 48 hour(s)).  Physical Findings: AIMS: Facial and Oral Movements Muscles of Facial Expression: None, normal Lips and Perioral Area: None, normal Jaw: None, normal Tongue: None, normal,Extremity Movements Upper (arms, wrists, hands, fingers): None, normal Lower (legs, knees, ankles, toes): None, normal, Trunk Movements Neck, shoulders, hips: None, normal, Overall Severity Severity of abnormal movements (highest score from questions above): None, normal Incapacitation due to abnormal movements: None, normal Patient's awareness of abnormal movements (rate only patient's report): No Awareness, Dental Status Current problems with teeth and/or dentures?: No Does patient usually wear dentures?: No  CIWA:  CIWA-Ar Total: 14 COWS:     Treatment Plan Summary: Daily contact with patient to assess and evaluate symptoms and progress in treatment Medication management Referral for appropriate followup care We will order a Dilantin level. We will resume his Prozac at 20 mg daily.  Plan:  Medical Decision Making Problem Points:  Established problem, stable/improving (1) and Review of psycho-social stressors (1) Data Points:  Review or order medicine tests (1) Review of medication regiment & side effects (2) Review of new medications or change in dosage (2)  I certify that inpatient services furnished can reasonably be expected to improve the patient's condition.   Blake Arnold 04/04/2012, 1:01 PM

## 2012-04-04 NOTE — Progress Notes (Signed)
Adult Psychoeducational Group Note  Date: 04/04/2012  Time: 0910  Group Topic/Focus:  Goals Group: The focus of this group is to help patients establish daily goals to achieve during treatment and discuss how the patient can incorporate goal setting into their daily lives to aide in recovery.  Participation Level: Did Not Attend  Participation Quality:  Affect:  Cognitive:  Insight:  Engagement in Group:  Modes of Intervention:  Additional Comments:  Schamberg, Eric Sean  04/04/2012, 10:37 AM  

## 2012-04-05 MED ORDER — NALTREXONE HCL 50 MG PO TABS
25.0000 mg | ORAL_TABLET | Freq: Once | ORAL | Status: AC
  Administered 2012-04-05: 25 mg via ORAL
  Filled 2012-04-05: qty 1

## 2012-04-05 MED ORDER — NALTREXONE HCL 50 MG PO TABS
50.0000 mg | ORAL_TABLET | Freq: Every day | ORAL | Status: DC
  Administered 2012-04-06 – 2012-04-07 (×2): 50 mg via ORAL
  Filled 2012-04-05: qty 14
  Filled 2012-04-05 (×3): qty 1

## 2012-04-05 NOTE — Progress Notes (Signed)
Patient ID: Blake Arnold, male   DOB: [birth year], 42 y.o.   MRN: [id number]   D: Pt observed sleeping in bed with eyes closed. RR even and unlabored. No distress noted  .  A: Q 15 minute checks were done for safety.  R: safety maintained on unit.

## 2012-04-05 NOTE — Progress Notes (Signed)
D:  Patient's self inventory sheet, patient has fair sleep, good appetite, low energy level, improving attention span.  Rated depression and hopelessness #4.  Has experienced tremors, chilling, cravings, agitation in past 24 hours.  SI off/on, contracts for safety.  Has been lightheaded, pain, dizzy, headache in past 24 hours.  Worst pain #5.  After discharge, "no drinking.  I want long term treatment in [facility]."  No discharge plans.  No problems taking medications after discharge. A:  Staff will monitor every 15 minutes for safety.  Emotional support and encouragement given patient.  Scheduled medications administered per MD order. R:  Denied HI.   Denied A/V hallucinations.  SI off/on, contracts for safety.  Patient remains safe on unit.

## 2012-04-05 NOTE — Progress Notes (Signed)
[facility] MD Progress Note  04/05/2012 10:04 AM Blake Arnold  MRN:  [id number] Subjective:  Blake Arnold is lying in the bed this morning about 10:00. He reports that he continues to have regular withdrawal symptoms, any cravings alcohol every day. He also endorses that he has suicidal thoughts daily, but denies that he has any plan or any intention on harming himself. He denies any homicidal ideations or auditory or visual hallucinations. He reports that his sleep is okay and his appetite is good. He is tolerating the Prozac but was resumed yesterday without problem. He hopes to go to a long-term treatment facility. He is willing to try a medication for cravings.  Diagnosis:   Axis I: Alcohol Dependence, Withdrawal, Major Depression, Mood Disorder NOS Axis II: Deferred Axis III:  Past Medical History  Diagnosis Date  . Hepatitis C   . Chronic back pain   . A-fib   . Depression   . Anxiety   . Hepatitis C     history of  . Chronic back pain   . Acid reflux   . History of urinary frequency   . Peripheral neuropathy     hands and feet  . Hypertension   . Polysubstance abuse 01/25/2011  . Alcohol abuse   . Seizures     ADL's:  Intact  Sleep: Fair  Appetite:  Good  Suicidal Ideation:  Patient endorses thoughts, but denies any plan or intent. Homicidal Ideation:  Patient denies thoughts, plan, or intent AEB (as evidenced by):  Psychiatric Specialty Exam: Review of Systems  Constitutional: Negative.   HENT: Negative.   Eyes: Negative.   Respiratory: Negative.   Gastrointestinal: Negative.   Genitourinary: Negative.   Musculoskeletal: Negative.   Skin: Negative.   Neurological: Positive for tremors.  Endo/Heme/Allergies: Negative.   Psychiatric/Behavioral: Positive for depression, suicidal ideas and substance abuse. Negative for hallucinations. The patient is nervous/anxious.     Blood pressure 115/78, pulse 84, temperature 97.6 F (36.4 C), temperature source Oral, resp.  rate 20, height 5\' 7"  (1.702 m), weight 88.451 kg (195 lb), SpO2 97.00%.Body mass index is 30.53 kg/(m^2).  General Appearance: Disheveled  Eye Contact::  Minimal  Speech:  Clear and Coherent  Volume:  Normal  Mood:  Anxious and Depressed  Affect:  Congruent  Thought Process:  Linear  Orientation:  Full (Time, Place, and Person)  Thought Content:  WDL  Suicidal Thoughts:  Yes.  without intent/plan  Homicidal Thoughts:  No  Memory:  Immediate;   Good Recent;   Good Remote;   Good  Judgement:  Fair  Insight:  Fair  Psychomotor Activity:  Normal  Concentration:  Good  Recall:  Good  Akathisia:  No  Handed:  Right  AIMS (if indicated):     Assets:  Communication Skills Desire for Improvement  Sleep:  Number of Hours: 6.5   Current Medications: Current Facility-Administered Medications  Medication Dose Route Frequency Provider Last Rate Last Dose  . acetaminophen (TYLENOL) tablet 650 mg  650 mg Oral Q6H PRN Sanjuana Kava, NP   650 mg at 04/04/12 1044  . alum & mag hydroxide-simeth (MAALOX/MYLANTA) 200-200-20 MG/5ML suspension 30 mL  30 mL Oral Q4H PRN Sanjuana Kava, NP      . aspirin EC tablet 81 mg  81 mg Oral Daily Sanjuana Kava, NP   81 mg at 04/05/12 0847  . chlordiazePOXIDE (LIBRIUM) capsule 25 mg  25 mg Oral Q6H PRN Sanjuana Kava, NP   25 mg  at 04/04/12 1044  . FLUoxetine (PROZAC) capsule 20 mg  20 mg Oral Daily Jorje Guild, PA-C   20 mg at 04/05/12 0848  . hydrOXYzine (ATARAX/VISTARIL) tablet 25 mg  25 mg Oral Q6H PRN Sanjuana Kava, NP   25 mg at 04/03/12 2134  . loperamide (IMODIUM) capsule 2-4 mg  2-4 mg Oral PRN Sanjuana Kava, NP      . magnesium hydroxide (MILK OF MAGNESIA) suspension 30 mL  30 mL Oral Daily PRN Sanjuana Kava, NP      . multivitamin with minerals tablet 1 tablet  1 tablet Oral Daily Sanjuana Kava, NP   1 tablet at 04/05/12 0848  . ondansetron (ZOFRAN-ODT) disintegrating tablet 4 mg  4 mg Oral Q6H PRN Sanjuana Kava, NP      . phenytoin (DILANTIN) ER capsule  300 mg  300 mg Oral Daily Sanjuana Kava, NP   300 mg at 04/05/12 0849  . thiamine (VITAMIN B-1) tablet 100 mg  100 mg Oral Daily Sanjuana Kava, NP   100 mg at 04/05/12 0848  . traZODone (DESYREL) tablet 50 mg  50 mg Oral QHS PRN Sanjuana Kava, NP   50 mg at 04/04/12 2153    Lab Results:  Results for orders placed during the hospital encounter of 04/02/12 (from the past 48 hour(s))  PHENYTOIN LEVEL, TOTAL     Status: Abnormal   Collection Time    04/04/12  7:22 PM      Result Value Range   Phenytoin Lvl 4.4 (*) 10.0 - 20.0 ug/mL    Physical Findings: AIMS: Facial and Oral Movements Muscles of Facial Expression: None, normal Lips and Perioral Area: None, normal Jaw: None, normal Tongue: None, normal,Extremity Movements Upper (arms, wrists, hands, fingers): None, normal Lower (legs, knees, ankles, toes): None, normal, Trunk Movements Neck, shoulders, hips: None, normal, Overall Severity Severity of abnormal movements (highest score from questions above): None, normal Incapacitation due to abnormal movements: None, normal Patient's awareness of abnormal movements (rate only patient's report): No Awareness, Dental Status Current problems with teeth and/or dentures?: No Does patient usually wear dentures?: No  CIWA:  CIWA-Ar Total: 1 COWS:  COWS Total Score: 1  Treatment Plan Summary: Daily contact with patient to assess and evaluate symptoms and progress in treatment Medication management Research possible treatment facilities for long-term treatment . We will start him on naltrexone for alcohol cravings We will follow his Dilantin level as Prozac may inhibit its metabolism. Plan:  Medical Decision Making Problem Points:  Established problem, stable/improving (1), Review of last therapy session (1) and Review of psycho-social stressors (1) Data Points:  Review or order clinical lab tests (1) Review of medication regiment & side effects (2) Review of new medications or change in  dosage (2)  I certify that inpatient services furnished can reasonably be expected to improve the patient's condition.   Deniqua Perry 04/05/2012, 10:04 AM
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
## 2012-04-05 NOTE — Progress Notes (Addendum)
Patient ID: Blake Arnold, male   DOB: 03-03-1970, 42 y.o.   MRN: [id number] D)  Has been out on the hall this evening, watching tv, attended group, has been pleasant and cooperative.  Stated he felt good that he hadn't let his relapse go on for too long and didn't plan to "go down that road again".  Felt trazadone may be all he needed to sleep and help the mild anxiety he felt, no other c/o's voiced. Denies SI/HI. A)  Will continue to monitor q 15 minutes for safety,continue POC. R)  Safety maintained, feeling positive about detox.

## 2012-04-06 NOTE — Progress Notes (Signed)
D:  Patient reported that he slept ok and his appetite is good.  He is rating depression and hopelessness at 4/10.  He states that he is having tremors and sweating at night.  He has passive SI, but contracts for safety.  He denies HI/AVH at this time.  He is attending groups and is interacting appropriately with staff and other patients. A:  Medication administered as ordered.  Safety checks q 15 minutes.  Emotional support provided.' R:  Safety maintained on unit.

## 2012-04-06 NOTE — Progress Notes (Signed)
[facility] LCSW Aftercare Discharge Planning Group Note  04/06/2012 8:45 AM  Participation Quality: Appropriate  Affect: Appropriate  Cognitive:  Alert and oriented  Insight: Developing  Engagement in Group:  Good  Modes of Intervention: Orientation, rapport building, exploration, clarification and support  Summary of Progress/Problems:  Pt denies both suicidal ideation and homicidal ideation.  On a scale of 1 to 10 with ten being the most ever experienced, the patient rates depression at a 3 to 4;  and anxiety at a 3 to 4. Patient reports that recently he was released from prison and has had difficulty finding employment.  "I think I got bored and started drinking agin but wanted to stop it before I started using pills."  Patient is interested in referral to [facility].   Clide Dales

## 2012-04-06 NOTE — Progress Notes (Signed)
Recreation Therapy Notes   Date: 02.24.2014  Time: 2:45pm  Location: [address] Day Room   Group Topic/Focus: Musician (AAA/T)   Participation Level:  Active   Participation Quality:  Appropriate   Affect:  Appropriate   Cognitive:  Appropriate   Additional Comments: AAA/T group session 02.24.2014 included AAA dog team. Patient with peers visited with dog team. Patient got to pet [place], the dog. Patient interacted appropriately with dog team, peers, and LRT.   Marykay Lex Sharmaine Bain, LRT/CTRS   Jearl Klinefelter 04/06/2012 3:57 PM

## 2012-04-06 NOTE — Progress Notes (Signed)
D.  Pt. Denies SI/HI and denies A/V hallucinations. Reports that he was sober for 10 months before he relapsed.  Reports that he is feeling better and hopes to help others. A.  Encouragement and support given. R.  Pt. Receptive.

## 2012-04-06 NOTE — Progress Notes (Signed)
Patient did attend the evening speaker AA meeting.  

## 2012-04-06 NOTE — Progress Notes (Signed)
[facility] LCSW Group Therapy - Late Entry  04/06/2012 1:15 PM  Type of Therapy:  Group Therapy 1:15 to 2:30 PM  Participation Level:  Limited  Participation Quality:  Appropriate  Affect: Appropriate  Cognitive: Alert and Oriented  Insight: Developing Improving  Engagement in Therapy: Limited  Modes of Intervention: Orientation, exploration, problem solving, discussion and support  Summary of Progress/Problems:  Group discussion focused on obstacles individual patients may face upon discharge.  Patients were able to identify their obstacles and process how they might better deal with the obstacles as they are likely to confront them again.  Blake Arnold shared that he feels he can avoid drinking/drugging again by avoiding friends who use; yet when guided he was able to process how that has not worked before.  Patient encouraged to explore resistance to treatment program discharge.   Clide Dales

## 2012-04-06 NOTE — Progress Notes (Signed)
Adult Psychoeducational Group Note  Date:  04/06/2012 Time:  10:53 AM  Group Topic/Focus: SMART goals group. Group focused on how to create a smart goal. Leader went over each aspect and explained them in detail. Pts prompted to fill out form and then asked to share what their goal is for the day.  Participation Level:  Minimal  Participation Quality:  Attentive  Affect:  Appropriate  Cognitive:  Oriented  Insight: Good  Engagement in Group:  Engaged  Modes of Intervention:  Discussion and Education  Additional Comments:  Pt indicated that he is here for alcohol dependance.  Icis Budreau T 04/06/2012, 10:53 AM

## 2012-04-06 NOTE — Progress Notes (Signed)
Patient ID: [name], male   DOB: 10-Mar-1970, 42 y.o.   MRN: [id number] [facility] MD Progress Note  04/06/2012 12:23 PM ZEDRICK SPRINGSTEEN  MRN:  [id number]  Subjective:  Mr. Ishler reports today that he came in for alcohol detoxification treatment after being sober x 10 months. He states that it was life stressors that drove him to drinking again. He adds that he is a chronic alcoholic who always crave alcohol. Rated his depression at #3, Mr. Debord denies any SIHI, AVH. He states that what will help keep him away from alcohol after discharge will be staying busy at all times. He plans on going back to school after discharge since he can't get anyone to hire due him for a job because he is a felon. Denies any withdrawal symptoms.  Diagnosis:   Axis I: Alcohol Dependence, Withdrawal, Major Depression, Mood Disorder NOS Axis II: Deferred Axis III:  Past Medical History  Diagnosis Date  . Hepatitis C   . Chronic back pain   . A-fib   . Depression   . Anxiety   . Hepatitis C     history of  . Chronic back pain   . Acid reflux   . History of urinary frequency   . Peripheral neuropathy     hands and feet  . Hypertension   . Polysubstance abuse 01/25/2011  . Alcohol abuse   . Seizures     ADL's:  Intact  Sleep: Good  Appetite:  Good  Suicidal Ideation:  Patient endorses thoughts, but denies any plan or intent.  Homicidal Ideation:  Patient denies thoughts, plan, or intent  AEB (as evidenced by): Per patient's reports.  Psychiatric Specialty Exam: Review of Systems  Constitutional: Negative.   HENT: Negative.   Eyes: Negative.   Respiratory: Negative.   Gastrointestinal: Negative.   Genitourinary: Negative.   Musculoskeletal: Negative.   Skin: Negative.   Neurological: Positive for tremors.  Endo/Heme/Allergies: Negative.   Psychiatric/Behavioral: Positive for depression, suicidal ideas and substance abuse. Negative for hallucinations. The patient is nervous/anxious.      Blood pressure 119/78, pulse 89, temperature 97.8 F (36.6 C), temperature source Oral, resp. rate 18, height 5\' 7"  (1.702 m), weight 88.451 kg (195 lb), SpO2 97.00%.Body mass index is 30.53 kg/(m^2).  General Appearance: Disheveled  Eye Contact::  Minimal  Speech:  Clear and Coherent  Volume:  Normal  Mood:  Anxious and Depressed  Affect:  Congruent  Thought Process:  Linear  Orientation:  Full (Time, Place, and Person)  Thought Content:  WDL  Suicidal Thoughts:  Yes.  without intent/plan  Homicidal Thoughts:  No  Memory:  Immediate;   Good Recent;   Good Remote;   Good  Judgement:  Fair  Insight:  Fair  Psychomotor Activity:  Normal  Concentration:  Good  Recall:  Good  Akathisia:  No  Handed:  Right  AIMS (if indicated):     Assets:  Communication Skills Desire for Improvement  Sleep:  Number of Hours: 6   Current Medications: Current Facility-Administered Medications  Medication Dose Route Frequency Provider Last Rate Last Dose  . acetaminophen (TYLENOL) tablet 650 mg  650 mg Oral Q6H PRN Sanjuana Kava, NP   650 mg at 04/04/12 1044  . alum & mag hydroxide-simeth (MAALOX/MYLANTA) 200-200-20 MG/5ML suspension 30 mL  30 mL Oral Q4H PRN Sanjuana Kava, NP      . aspirin EC tablet 81 mg  81 mg Oral Daily Sanjuana Kava, NP  81 mg at 04/06/12 0749  . FLUoxetine (PROZAC) capsule 20 mg  20 mg Oral Daily Jorje Guild, PA-C   20 mg at 04/06/12 0749  . magnesium hydroxide (MILK OF MAGNESIA) suspension 30 mL  30 mL Oral Daily PRN Sanjuana Kava, NP      . multivitamin with minerals tablet 1 tablet  1 tablet Oral Daily Sanjuana Kava, NP   1 tablet at 04/06/12 0749  . naltrexone (DEPADE) tablet 50 mg  50 mg Oral Daily Jorje Guild, PA-C   50 mg at 04/06/12 0749  . phenytoin (DILANTIN) ER capsule 300 mg  300 mg Oral Daily Sanjuana Kava, NP   300 mg at 04/06/12 0749  . thiamine (VITAMIN B-1) tablet 100 mg  100 mg Oral Daily Sanjuana Kava, NP   100 mg at 04/06/12 0749  . traZODone (DESYREL)  tablet 50 mg  50 mg Oral QHS PRN Sanjuana Kava, NP   50 mg at 04/05/12 2231    Lab Results:  Results for orders placed during the hospital encounter of 04/02/12 (from the past 48 hour(s))  PHENYTOIN LEVEL, TOTAL     Status: Abnormal   Collection Time    04/04/12  7:22 PM      Result Value Range   Phenytoin Lvl 4.4 (*) 10.0 - 20.0 ug/mL    Physical Findings: AIMS: Facial and Oral Movements Muscles of Facial Expression: None, normal Lips and Perioral Area: None, normal Jaw: None, normal Tongue: None, normal,Extremity Movements Upper (arms, wrists, hands, fingers): None, normal Lower (legs, knees, ankles, toes): None, normal, Trunk Movements Neck, shoulders, hips: None, normal, Overall Severity Severity of abnormal movements (highest score from questions above): None, normal Incapacitation due to abnormal movements: None, normal Patient's awareness of abnormal movements (rate only patient's report): No Awareness, Dental Status Current problems with teeth and/or dentures?: No Does patient usually wear dentures?: No  CIWA:  CIWA-Ar Total: 0 COWS:  COWS Total Score: 1  Treatment Plan Summary: Daily contact with patient to assess and evaluate symptoms and progress in treatment Medication management Research possible treatment facilities for long-term treatment .  Plan: Supportive approach/coping skills/relapse prevention. Encouraged out of room, participation in group sessions and application of coping skills when distressed. Will continue to monitor response to/adverse effects of medications in use to assure effectiveness. Continue to monitor mood, behavior and interaction with staff and other patients. Continue current plan of care.  Medical Decision Making Problem Points:  Established problem, stable/improving (1), Review of last therapy session (1) and Review of psycho-social stressors (1) Data Points:  Review or order clinical lab tests (1) Review of medication regiment &  side effects (2) Review of new medications or change in dosage (2)  I certify that inpatient services furnished can reasonably be expected to improve the patient's condition.   Armandina Stammer I 04/06/2012, 12:23 PM

## 2012-04-06 NOTE — Tx Team (Signed)
Interdisciplinary Treatment Plan Update (Adult)  Date: 04/06/2012   Time Reviewed: 9:59 AM   Progress in Treatment:  Attending groups: Yes  Participating in groups: Yes  Taking medication as prescribed: Yes  Tolerating medication: Yes  Family/Significant othe contact made: No  Patient understands diagnosis: Yes  Discussing patient identified problems/goals with staff: Yes  Medical problems stabilized or resolved: Yes  Denies suicidal/homicidal ideation: Yes  Patient has not harmed self or Others: Yes   New problem(s) identified: None Identified   Discharge Plan or Barriers: CSW referring patient to [facility].   Additional comments: N/A   Reason for Continuation of Hospitalization:  Medication stabilization  Withdrawal symptoms   Estimated length of stay: 2-3 days   For review of initial/current patient goals, please see plan of care.   Attendees:  Patient:    Family:    Physician: Geoffery Lyons  04/06/2012 9:59 AM   Nursing: Roswell Miners, RN  04/06/2012 9:59 AM   Clinical Social Worker Ronda Fairly  04/06/2012 9:59 AM   Other: Chinita Greenland, RN  04/06/2012 9:59 AM   Other: Linton Rump, RN  04/06/2012 9:59 AM   Other: Olivia Mackie, Psych Intern  04/06/2012 9:59 AM   Other:  04/06/2012 9:59 AM    Scribe for Treatment Team:  Carney Bern, LCSWA 04/06/2012 9:59 AM

## 2012-04-07 MED ORDER — FLUOXETINE HCL 20 MG PO CAPS
20.0000 mg | ORAL_CAPSULE | Freq: Every day | ORAL | Status: DC
Start: 2012-04-07 — End: 2012-04-07

## 2012-04-07 MED ORDER — TRAZODONE HCL 50 MG PO TABS: 50.0000 mg | ORAL_TABLET | Freq: Every evening | ORAL | Status: DC | PRN

## 2012-04-07 MED ORDER — ASPIRIN EC 81 MG PO TBEC: 81.0000 mg | DELAYED_RELEASE_TABLET | Freq: Every day | ORAL | Status: DC

## 2012-04-07 MED ORDER — NALTREXONE HCL 50 MG PO TABS: 50.0000 mg | ORAL_TABLET | Freq: Every day | ORAL | Status: DC

## 2012-04-07 MED ORDER — FLUOXETINE HCL 20 MG PO CAPS
20.0000 mg | ORAL_CAPSULE | Freq: Every day | ORAL | Status: DC
Start: 2012-04-07 — End: 2012-06-06

## 2012-04-07 MED ORDER — PHENYTOIN SODIUM EXTENDED 100 MG PO CAPS: 300.0000 mg | ORAL_CAPSULE | Freq: Every day | ORAL | Status: DC

## 2012-04-07 NOTE — Progress Notes (Signed)
[facility] LCSW Aftercare Discharge Planning Group Note  04/07/2012 8:45 AM  Participation Quality:  Appropriate  Affect:  Appropriate  Cognitive:  Alert and Oriented  Insight:  Improving  Engagement in Group:  Engaged  Modes of Intervention:  Clarification, Exploration, Rapport Building and Support  Summary of Progress/Problems: Pt denies suicidal ideation and homicidal ideation.  On a scale of 1 to 10 with ten being the most ever experienced, the patient rates depression at a 1 and anxiety at a 1. Patient continues to report no interest in further treatment.  Patient request letter for his probation officer, will make total of  3 copies for other service providers he requests, GEO instructor and Voc Rehab case worker   Clide Dales 04/07/2012, 12:48 PM

## 2012-04-07 NOTE — Progress Notes (Signed)
Pt was discharged home today.  He denied any S/I H/I or A/V hallucinations.    He was given f/u appointment, rx, sample medications, hotline info booklet, bus pass, and letter provided by the case manager.  He voiced understanding to all instructions provided.  He declined the need for smoking cessation materials.  He removed his nicotine patch before he left.

## 2012-04-07 NOTE — Progress Notes (Signed)
D: Pt in bed resting with eyes closed. Respirations even and unlabored. Pt appears to be in no signs of distress at this time. A: Q15min checks remains for this pt. R: Pt remains safe at this time.   

## 2012-04-07 NOTE — Discharge Summary (Addendum)
Physician Discharge Summary Note  Patient:  Blake Arnold is an 42 y.o., male MRN:  [id number] DOB:  June 04, 1970 Patient phone:  [phone] (home)  Patient address:   [address] [place] [address],   Date of Admission:  04/02/2012  Date of Discharge: 04/07/12  Reason for Admission:  Alcohol dependence  Discharge Diagnoses: Active Problems:   Alcohol dependence   Alcohol withdrawal   Major depression  Review of Systems  Constitutional: Negative.   HENT: Negative.   Eyes: Negative.   Respiratory: Negative.   Cardiovascular: Negative.   Gastrointestinal: Negative.   Genitourinary: Negative.   Musculoskeletal: Negative.   Skin: Negative.   Neurological: Negative.   Endo/Heme/Allergies: Negative.   Psychiatric/Behavioral: Positive for depression (Stabilized with medication prior to discharge) and substance abuse. Negative for suicidal ideas and hallucinations. The patient has insomnia (Stabilized with medication prior to discharge). The patient is not nervous/anxious.    Axis Diagnosis:   AXIS I:  Alcohol dependence, Major depression AXIS II:  Deferred AXIS III:   Past Medical History  Diagnosis Date  . Hepatitis C   . Chronic back pain   . A-fib   . Depression   . Anxiety   . Hepatitis C     history of  . Chronic back pain   . Acid reflux   . History of urinary frequency   . Peripheral neuropathy     hands and feet  . Hypertension   . Polysubstance abuse 01/25/2011  . Alcohol abuse   . Seizures    AXIS IV:  other psychosocial or environmental problems AXIS V:  63  Level of Care:  OP  Hospital Course:  He was here a year ago. After he was discharged he went to prison for previous charges. He was there for 6 months. He got out Nov 12. First drink Christmas Day then February. Last two weeks a fifth of liquor a day plus.Staying with his mother. He is on parole.  Upon admission into this hospital and after admission assessment/evaluation, it was  determined that Mr. Artesia was intoxicated and need detoxification treatment to stabilize his system and combat the withdrawal symptoms of alcohol. He was then started on Librium protocol for his alcohol detoxification. He was also enrolled in group counseling sessions and activities to learn coping skills that should help him after discharge to cope better and manage his substance abuse issues for a much longer sobriety. He also was enrolled/attended in the AA/NA meetings being offered and held on this unit. He has some previous and or identifiable medical conditions that required treatment and or monitoring. He received medication management for all those health issues as well. He was monitored closely for any potential problems that may arise as a result of and or during detoxification treatment. Patient tolerated his treatment regimen and detoxification treatment without any significant adverse effects and or reactions presented.  Besides the Librium treatment protocol, Mr. Umscheid also was ordered and received Fluoxetine 20 mg daily for depression, Naltrexone 50 mg for prevention of alcohol abuse and Trazodone 50 mg Q bedtime for sleep. He attended treatment team meeting this am and met with the treatment team members. His symptoms, substance abuse issues, response to to treatment and discharge plans discussed. Patient endorsed that he is doing well and stable for discharge to pursue the next phase of his substance abuse treatment. It was then agreed upon between patient and the team that he will be discharged to his home. However, he will  continue psychiatric care on outpatient basis at [facility] here in [place] on 04/09/12 between the hours of 08:00 am and 12:00 pm. Patient is instructed that this is a walk-in appointment and should make every effort to make this appointment timely. The address, date and time for this appointment provided for patient in writing.  He was encouraged to join/attend AA/NA  meetings being offered and held in his community. Mr. Krupka is instructed to get a trusted sponsor from the advise of others or from whomever within the AA/NA meetings seems to make sense, and has a proven track record, and will hold him responsible for his sobriety, and both expects and insists on total his abstinence from alcohol.    Upon discharge, patient adamantly denies suicidal, homicidal ideations, auditory, visual hallucinations, delusional thinking, paranoia and or withdrawal symptoms. Patient left [facility] with all personal belongings in no apparent distress. He received 2 weeks worth samples of his discharge medications. Transportation per family.   Consults:  None  Significant Diagnostic Studies:  labs: CBC with diff, CMP, UDS, Toxicology tests  Discharge Vitals:   Blood pressure 126/77, pulse 85, temperature 97.7 F (36.5 C), temperature source Oral, resp. rate 16, height 5\' 7"  (1.702 m), weight 88.451 kg (195 lb), SpO2 97.00%. Body mass index is 30.53 kg/(m^2). Lab Results:   Results for orders placed during the hospital encounter of 04/02/12 (from the past 72 hour(s))  PHENYTOIN LEVEL, TOTAL     Status: Abnormal   Collection Time    04/04/12  7:22 PM      Result Value Range   Phenytoin Lvl 4.4 (*) 10.0 - 20.0 ug/mL  PHENYTOIN LEVEL, TOTAL     Status: Abnormal   Collection Time    04/06/12  7:34 PM      Result Value Range   Phenytoin Lvl 5.6 (*) 10.0 - 20.0 ug/mL    Physical Findings: AIMS: Facial and Oral Movements Muscles of Facial Expression: None, normal Lips and Perioral Area: None, normal Jaw: None, normal Tongue: None, normal,Extremity Movements Upper (arms, wrists, hands, fingers): None, normal Lower (legs, knees, ankles, toes): None, normal, Trunk Movements Neck, shoulders, hips: None, normal, Overall Severity Severity of abnormal movements (highest score from questions above): None, normal Incapacitation due to abnormal movements: None, normal Patient's  awareness of abnormal movements (rate only patient's report): No Awareness, Dental Status Current problems with teeth and/or dentures?: No Does patient usually wear dentures?: No  CIWA:  CIWA-Ar Total: 0 COWS:  COWS Total Score: 1  Psychiatric Specialty Exam: See Psychiatric Specialty Exam and Suicide Risk Assessment completed by Attending Physician prior to discharge. In full contact with reality. There are no suicidal ideas plans or intent. His mood is euthymic. His affect is appropriate Discharge destination:  Home  Is patient on multiple antipsychotic therapies at discharge:  No   Has Patient had three or more failed trials of antipsychotic monotherapy by history:  No  Recommended Plan for Multiple Antipsychotic Therapies: NA     Medication List    TAKE these medications     Indication   aspirin EC 81 MG tablet  Take 1 tablet (81 mg total) by mouth daily. Blood thinner for heart health   Indication:  Blood thinner     FLUoxetine 20 MG capsule  Commonly known as:  PROZAC  Take 1 capsule (20 mg total) by mouth daily. For depression   Indication:  Major Depressive Disorder     naltrexone 50 MG tablet  Commonly known as:  DEPADE  Take 1 tablet (50 mg total) by mouth daily. For prevention of alcohol abuse   Indication:  Excessive Use of Alcohol     phenytoin 100 MG ER capsule  Commonly known as:  DILANTIN  Take 3 capsules (300 mg total) by mouth daily. For seizure disorder   Indication:  Tonic-Clonic Seizures     traZODone 50 MG tablet  Commonly known as:  DESYREL  Take 1 tablet (50 mg total) by mouth at bedtime as needed for sleep or depression. For depression/sleep   Indication:  Trouble Sleeping, Major Depressive Disorder       Follow-up Information   Follow up with [facility]. (Followup at [facility] clinic on Thursday 04/09/12 between 8AM & 12PM OR 1:15PM  and 4:15PM; you will have assessment that day after which they will schedule you for medication  management)    Contact information:   [address] [place] [address] [phone]      Follow-up recommendations: Activity:  as tolerated Other:  Keep all scheduled follow-up appointments as recommended.    Comments:  Take all your medications as prescribed by your mental healthcare provider. Report any adverse effects and or reactions from your medicines to your outpatient provider promptly. Patient is instructed and cautioned to not engage in alcohol and or illegal drug use while on prescription medicines. In the event of worsening symptoms, patient is instructed to call the crisis hotline, 911 and or go to the nearest ED for appropriate evaluation and treatment of symptoms. Follow-up with your primary care provider for your other medical issues, concerns and or health care needs.    Total Discharge Time:  Greater than 30 minutes  Signed: Armandina Stammer I 04/07/2012, 1:05 PM

## 2012-04-07 NOTE — [facility] Suicide Risk Assessment (Signed)
Suicide Risk Assessment  Discharge Assessment     Demographic Factors:  Male and Caucasian  Mental Status Per Nursing Assessment::   On Admission:  NA  Current Mental Status by Physician: Denies suicidal ideas, plans or intent. He is in full contact with reality. His mood is euthymic, his affect is appropriate. He is wanting to be discharged today. He is going to be going home, staying with his mother. He plans to eventually get into a long term program   Loss Factors: Legal issues and Financial problems/change in socioeconomic status  Historical Factors: In full contact with reality. There are no suicidal ideas, plans or intent. His mood is euthymic. His affect is appropriate. He is willing and motivated to pursue further outpatient treatment. He is committed to long term abstinence.   Risk Reduction Factors:   Sense of responsibility to family, Living with another person, especially a relative and Positive social support  Continued Clinical Symptoms:  Depression:   Comorbid alcohol abuse/dependence Alcohol/Substance Abuse/Dependencies  Cognitive Features That Contribute To Risk: None identified   Suicide Risk:  Minimal: No identifiable suicidal ideation.  Patients presenting with no risk factors but with morbid ruminations; may be classified as minimal risk based on the severity of the depressive symptoms  Discharge Diagnoses:   AXIS I:  Alcohol Dependence/withdrawal, Major Depression AXIS II:  Deferred AXIS III:   Past Medical History  Diagnosis Date  . Hepatitis C   . Chronic back pain   . A-fib   . Depression   . Anxiety   . Hepatitis C     history of  . Chronic back pain   . Acid reflux   . History of urinary frequency   . Peripheral neuropathy     hands and feet  . Hypertension   . Polysubstance abuse 01/25/2011  . Alcohol abuse   . Seizures    AXIS IV:  other psychosocial or environmental problems AXIS V:  61-70 mild symptoms  Plan Of Care/Follow-up  recommendations:  Activity:  As tolerated Diet:  regular Follow up outpatient basis Is patient on multiple antipsychotic therapies at discharge:  No   Has Patient had three or more failed trials of antipsychotic monotherapy by history:  No  Recommended Plan for Multiple Antipsychotic Therapies: N/A   Blake Arnold A 04/07/2012, 12:48 PM

## 2012-04-07 NOTE — Progress Notes (Signed)
Adult Psychoeducational Group Note  Date:  04/07/2012 Time:  1:04 PM  Group Topic/Focus:  Recovery Goals:   The focus of this group is to identify appropriate goals for recovery and establish a plan to achieve them.  Participation Level:  Active  Participation Quality:  Appropriate, Attentive and Sharing  Affect:  Appropriate  Cognitive:  Alert and Appropriate  Insight: Appropriate  Engagement in Group:  Engaged  Modes of Intervention:  Discussion  Additional Comments:  Pt was appropriate and sharing while attending group. Pt stated that he thinks of the word "Healed" when talking about recovery. Pt also stated that a obstacle between him and recovery is the 13 year relationship that he was in and has to now end because he wants to change and she does not.   Sharyn Lull 04/07/2012, 1:04 PM

## 2012-04-10 NOTE — Progress Notes (Signed)
Patient Discharge Instructions:  After Visit Summary (AVS):   Faxed to:  04/10/12 Discharge Summary Note:   Faxed to:  04/10/12 Psychiatric Admission Assessment Note:   Faxed to:  04/10/12 Suicide Risk Assessment - Discharge Assessment:   Faxed to:  04/10/12 Faxed/Sent to the Next Level Care provider:  04/10/12 Faxed to [facility] @ [phone]  Jerelene Redden, 04/10/2012, 4:14 PM

## 2012-06-06 ENCOUNTER — Emergency Department (HOSPITAL_COMMUNITY)
Admission: EM | Admit: 2012-06-06 | Discharge: 2012-06-07 | Disposition: A | Discharge status: Other Acute Inpatient Hospital | Payer: Self-pay | Attending: Emergency Medicine | Admitting: Emergency Medicine

## 2012-06-06 ENCOUNTER — Encounter (HOSPITAL_COMMUNITY): Payer: Self-pay | Admitting: Physical Medicine and Rehabilitation

## 2012-06-06 DIAGNOSIS — R109 Unspecified abdominal pain: Secondary | ICD-10-CM | POA: Insufficient documentation

## 2012-06-06 DIAGNOSIS — F1010 Alcohol abuse, uncomplicated: Secondary | ICD-10-CM | POA: Insufficient documentation

## 2012-06-06 DIAGNOSIS — F17200 Nicotine dependence, unspecified, uncomplicated: Secondary | ICD-10-CM | POA: Insufficient documentation

## 2012-06-06 DIAGNOSIS — F1910 Other psychoactive substance abuse, uncomplicated: Secondary | ICD-10-CM | POA: Insufficient documentation

## 2012-06-06 DIAGNOSIS — Z8719 Personal history of other diseases of the digestive system: Secondary | ICD-10-CM | POA: Insufficient documentation

## 2012-06-06 DIAGNOSIS — F329 Major depressive disorder, single episode, unspecified: Secondary | ICD-10-CM | POA: Insufficient documentation

## 2012-06-06 DIAGNOSIS — F3289 Other specified depressive episodes: Secondary | ICD-10-CM | POA: Insufficient documentation

## 2012-06-06 DIAGNOSIS — I10 Essential (primary) hypertension: Secondary | ICD-10-CM | POA: Insufficient documentation

## 2012-06-06 DIAGNOSIS — Z8669 Personal history of other diseases of the nervous system and sense organs: Secondary | ICD-10-CM | POA: Insufficient documentation

## 2012-06-06 DIAGNOSIS — F1020 Alcohol dependence, uncomplicated: Secondary | ICD-10-CM

## 2012-06-06 DIAGNOSIS — Z79899 Other long term (current) drug therapy: Secondary | ICD-10-CM | POA: Insufficient documentation

## 2012-06-06 DIAGNOSIS — Z7982 Long term (current) use of aspirin: Secondary | ICD-10-CM | POA: Insufficient documentation

## 2012-06-06 DIAGNOSIS — M549 Dorsalgia, unspecified: Secondary | ICD-10-CM | POA: Insufficient documentation

## 2012-06-06 DIAGNOSIS — G40909 Epilepsy, unspecified, not intractable, without status epilepticus: Secondary | ICD-10-CM | POA: Insufficient documentation

## 2012-06-06 DIAGNOSIS — F411 Generalized anxiety disorder: Secondary | ICD-10-CM | POA: Insufficient documentation

## 2012-06-06 DIAGNOSIS — Z8679 Personal history of other diseases of the circulatory system: Secondary | ICD-10-CM | POA: Insufficient documentation

## 2012-06-06 DIAGNOSIS — Z8619 Personal history of other infectious and parasitic diseases: Secondary | ICD-10-CM | POA: Insufficient documentation

## 2012-06-06 LAB — COMPREHENSIVE METABOLIC PANEL
AST: 28 U/L (ref 0–37)
BUN: 9 mg/dL (ref 6–23)
CO2: 24 mEq/L (ref 19–32)
Calcium: 9.3 mg/dL (ref 8.4–10.5)
Creatinine, Ser: 0.77 mg/dL (ref 0.50–1.35)
GFR calc non Af Amer: >90 mL/min (ref >90)
Total Bilirubin: 0.4 mg/dL (ref 0.3–1.2)

## 2012-06-06 LAB — CBC WITH DIFFERENTIAL/PLATELET
Basophils Absolute: 0.1 10*3/uL (ref 0.0–0.1)
Basophils Relative: 1 % (ref 0–1)
Eosinophils Relative: 2 % (ref 0–5)
HCT: 49.3 % (ref 39.0–52.0)
Hemoglobin: 17.9 g/dL — ABNORMAL HIGH (ref 13.0–17.0)
MCHC: 36.3 g/dL — ABNORMAL HIGH (ref 30.0–36.0)
MCV: 90 fL (ref 78.0–100.0)
Monocytes Absolute: 0.6 10*3/uL (ref 0.1–1.0)
Monocytes Relative: 7 % (ref 3–12)
RDW: 14 % (ref 11.5–15.5)

## 2012-06-06 LAB — RAPID URINE DRUG SCREEN, HOSP PERFORMED
Barbiturates: NOT DETECTED
Benzodiazepines: NOT DETECTED

## 2012-06-06 LAB — PHENYTOIN LEVEL, TOTAL: Phenytoin Lvl: 0.6 ug/mL — ABNORMAL LOW (ref 10.0–20.0)

## 2012-06-06 LAB — ETHANOL: Alcohol, Ethyl (B): 52 mg/dL — ABNORMAL HIGH (ref 0–11)

## 2012-06-06 LAB — SALICYLATE LEVEL: Salicylate Lvl: <2 mg/dL — ABNORMAL LOW (ref 2.8–20.0)

## 2012-06-06 LAB — ACETAMINOPHEN LEVEL: Acetaminophen (Tylenol), Serum: <15 ug/mL (ref 10–30)

## 2012-06-06 MED ORDER — FLUOXETINE HCL 20 MG PO CAPS
20.0000 mg | ORAL_CAPSULE | Freq: Every day | ORAL | Status: DC
  Administered 2012-06-06 – 2012-06-07 (×2): 20 mg via ORAL
  Filled 2012-06-06 (×2): qty 1

## 2012-06-06 MED ORDER — ADULT MULTIVITAMIN W/MINERALS CH
1.0000 | ORAL_TABLET | Freq: Every day | ORAL | Status: DC
  Administered 2012-06-06 – 2012-06-07 (×2): 1 via ORAL
  Filled 2012-06-06 (×2): qty 1

## 2012-06-06 MED ORDER — IBUPROFEN 200 MG PO TABS
600.0000 mg | ORAL_TABLET | Freq: Three times a day (TID) | ORAL | Status: DC | PRN
  Administered 2012-06-06 – 2012-06-07 (×3): 600 mg via ORAL
  Filled 2012-06-06 (×2): qty 3

## 2012-06-06 MED ORDER — LORAZEPAM 1 MG PO TABS: 0.0000 mg | ORAL_TABLET | Freq: Two times a day (BID) | ORAL | Status: DC

## 2012-06-06 MED ORDER — ONDANSETRON HCL 8 MG PO TABS
4.0000 mg | ORAL_TABLET | Freq: Three times a day (TID) | ORAL | Status: DC | PRN
  Administered 2012-06-07: 4 mg via ORAL
  Filled 2012-06-06: qty 2

## 2012-06-06 MED ORDER — PHENYTOIN SODIUM EXTENDED 100 MG PO CAPS
300.0000 mg | ORAL_CAPSULE | Freq: Every day | ORAL | Status: DC
  Administered 2012-06-06 – 2012-06-07 (×2): 300 mg via ORAL
  Filled 2012-06-06 (×2): qty 3

## 2012-06-06 MED ORDER — TRAZODONE HCL 50 MG PO TABS: 50.0000 mg | ORAL_TABLET | Freq: Every evening | ORAL | Status: DC | PRN

## 2012-06-06 MED ORDER — VITAMIN B-1 100 MG PO TABS
100.0000 mg | ORAL_TABLET | Freq: Every day | ORAL | Status: DC
  Administered 2012-06-06 – 2012-06-07 (×2): 100 mg via ORAL
  Filled 2012-06-06 (×2): qty 1

## 2012-06-06 MED ORDER — FOLIC ACID 1 MG PO TABS
1.0000 mg | ORAL_TABLET | Freq: Every day | ORAL | Status: DC
  Administered 2012-06-06 – 2012-06-07 (×2): 1 mg via ORAL
  Filled 2012-06-06 (×2): qty 1

## 2012-06-06 MED ORDER — THIAMINE HCL 100 MG/ML IJ SOLN: 100.0000 mg | Freq: Every day | INTRAMUSCULAR | Status: DC

## 2012-06-06 MED ORDER — ASPIRIN EC 81 MG PO TBEC
81.0000 mg | DELAYED_RELEASE_TABLET | Freq: Every day | ORAL | Status: DC
  Administered 2012-06-06 – 2012-06-07 (×2): 81 mg via ORAL
  Filled 2012-06-06 (×2): qty 1

## 2012-06-06 MED ORDER — LORAZEPAM 1 MG PO TABS
0.0000 mg | ORAL_TABLET | Freq: Four times a day (QID) | ORAL | Status: DC
  Administered 2012-06-06 – 2012-06-07 (×4): 1 mg via ORAL
  Filled 2012-06-06 (×4): qty 1

## 2012-06-06 MED ORDER — LORAZEPAM 2 MG/ML IJ SOLN: 1.0000 mg | Freq: Four times a day (QID) | INTRAMUSCULAR | Status: DC | PRN

## 2012-06-06 MED ORDER — LORAZEPAM 1 MG PO TABS
1.0000 mg | ORAL_TABLET | Freq: Four times a day (QID) | ORAL | Status: DC | PRN
  Administered 2012-06-07 (×2): 1 mg via ORAL
  Filled 2012-06-06 (×2): qty 1

## 2012-06-06 MED ORDER — SODIUM CHLORIDE 0.9 % IV SOLN
1000.0000 mg | Freq: Once | INTRAVENOUS | Status: AC
  Administered 2012-06-06: 1000 mg via INTRAVENOUS
  Filled 2012-06-06: qty 20

## 2012-06-06 NOTE — BH Assessment (Signed)
[facility] Assessment Progress Note   This clinician sent referral material to [facility].  Brenda from [facility] called back at 23:32 and said that they could not take patient because of his seizure d/o.  Clinician to try [facility] to see if they have any bed availability.

## 2012-06-06 NOTE — [facility] Group Notes (Signed)
Patient declined at [facility]. Writer spoke to [facility] and there is bed aviability. Incoming staff will need to send referral information to [facility].

## 2012-06-06 NOTE — ED Notes (Signed)
Pt up at desk making phone call 

## 2012-06-06 NOTE — BH Assessment (Addendum)
Assessment Note      Patient is a 42 year old white male that requesting detox from alcohol.   Patient reports that he drinks 6-8 40 ounce beers daily.  Patient reports that his last drink was last night.  Patient reports that he has been drinking  heavily for 3 weeks.  Patient has a BAL of 52.  Patient UDS was positive for cocaine.   Patient reports that he has been addicted to alcohol for over 10 years. Patient reports that he tried cocaine yesterday.  Patient reports that he only tried cocaine because he was drunk.   Patient reports that his longest period of sobriety was for one year in 2013.  Patient reports that he has been in several treatment facilities from 2010 until 2013.  Patient reports withdrawal symptoms that include agitation, sweats, mild anxiety, sweats, irritability, cramps and weakness.  Patient reports a past history of mental illness.  Patient reports that he has been diagnosed with Depressive Disorder and prescribed medication in 2013 from [place].  Patient reports that he has been non compliant with taking his medication.  Patient denies SI/HI.  Patient denies psychosis.  Patient denies any prior psychiatric hospitalizations.  Patient reports feelings of depression and hopelessness.  Patient reports that he is currently on probation. Patient has a CIWA score of 8.          Axis I: Alcohol Dependence and Depressive Disorder  Axis II: Deferred Axis III:  Past Medical History  Diagnosis Date  . Hepatitis C   . Chronic back pain   . A-fib   . Depression   . Anxiety   . Hepatitis C     history of  . Chronic back pain   . Acid reflux   . History of urinary frequency   . Peripheral neuropathy     hands and feet  . Hypertension   . Polysubstance abuse 01/25/2011  . Alcohol abuse   . Seizures    Axis IV: economic problems, housing problems, occupational problems, other psychosocial or environmental problems, problems related to legal system/crime, problems related to  social environment, problems with access to health care services and problems with primary support group Axis V: 31-40 impairment in reality testing  Past Medical History:  Past Medical History  Diagnosis Date  . Hepatitis C   . Chronic back pain   . A-fib   . Depression   . Anxiety   . Hepatitis C     history of  . Chronic back pain   . Acid reflux   . History of urinary frequency   . Peripheral neuropathy     hands and feet  . Hypertension   . Polysubstance abuse 01/25/2011  . Alcohol abuse   . Seizures     Past Surgical History  Procedure Laterality Date  . Cardioversion  03/07/2006    Family History: History reviewed. No pertinent family history.  Social History:  reports that he has been smoking Cigarettes.  He has a 25 pack-year smoking history. He has never used smokeless tobacco. He reports that  drinks alcohol. He reports that he uses illicit drugs ("Crack" cocaine and Cocaine).  Additional Social History:  Alcohol / Drug Use Pain Medications: See Mar Prescriptions: See Mar Over the Counter: See Mar  History of alcohol / drug use?: Yes Longest period of sobriety (when/how long): 1 year (2013) Negative Consequences of Use: Financial;Legal;Personal relationships;Work / Programmer, multimedia Withdrawal Symptoms: Agitation;Cramps;Fever / Chills;Irritability;Nausea / Vomiting;Patient aware of relationship between  substance abuse and physical/medical complications;Sweats;Weakness Substance #1 Name of Substance 1: Alcohol 1 - Age of First Use: 14 1 - Amount (size/oz): 7 (40oz) of beer daily  1 - Frequency: Daily 1 - Duration: For the past year  1 - Last Use / Amount: Yesterday   CIWA: CIWA-Ar BP: 124/75 mmHg Pulse Rate: 89 Nausea and Vomiting: mild nausea with no vomiting Tactile Disturbances: none Tremor: no tremor Auditory Disturbances: not present Paroxysmal Sweats: barely perceptible sweating, palms moist Visual Disturbances: not present Anxiety: mildly  anxious Headache, Fullness in Head: moderate Agitation: two Orientation and Clouding of Sensorium: oriented and can do serial additions CIWA-Ar Total: 8 COWS:    Allergies: No Known Allergies  Home Medications:  (Not in a hospital admission)  OB/GYN Status:  No LMP for male patient.  General Assessment Data Location of Assessment: [facility] ED ACT Assessment: Yes Living Arrangements: Alone Can pt return to current living arrangement?: Yes Admission Status: Voluntary Is patient capable of signing voluntary admission?: Yes Transfer from: Acute Hospital Referral Source: Self/Family/Friend     Risk to self Suicidal Ideation: No Suicidal Intent: No Is patient at risk for suicide?: No Suicidal Plan?: No Access to Means: No What has been your use of drugs/alcohol within the last 12 months?: Alcohol Previous Attempts/Gestures: No How many times?: 0 Other Self Harm Risks: No Triggers for Past Attempts: None known Intentional Self Injurious Behavior: None Family Suicide History: No Recent stressful life event(s): Job Loss;Financial Problems;Legal Issues;Other (Comment) Persecutory voices/beliefs?: No Depression: Yes Depression Symptoms: Fatigue;Guilt;Loss of interest in usual pleasures;Feeling worthless/self pity Substance abuse history and/or treatment for substance abuse?: Yes Suicide prevention information given to non-admitted patients: Not applicable  Risk to Others Homicidal Ideation: No Thoughts of Harm to Others: No Current Homicidal Intent: No Current Homicidal Plan: No Access to Homicidal Means: No Identified Victim: None  History of harm to others?: No Assessment of Violence: None Noted Violent Behavior Description: none  Does patient have access to weapons?: No Criminal Charges Pending?: No Does patient have a court date: No  Psychosis Hallucinations: None noted Delusions: None noted  Mental Status Report Appear/Hygiene: Disheveled Eye Contact: Fair Motor  Activity: Freedom of movement Speech: Logical/coherent Level of Consciousness: Alert Mood: Depressed Affect: Depressed Anxiety Level: None Thought Processes: Coherent Judgement: Unimpaired Orientation: Person;Place;Time;Situation Obsessive Compulsive Thoughts/Behaviors: None  Cognitive Functioning Concentration: Decreased Memory: Recent Intact;Remote Intact IQ: Average Insight: Fair Impulse Control: Poor Appetite: Poor Weight Loss: 0 Weight Gain: 0 Sleep: Decreased Total Hours of Sleep: 5 Vegetative Symptoms: Decreased grooming  ADLScreening [facility] Assessment Services) Patient's cognitive ability adequate to safely complete daily activities?: Yes Patient able to express need for assistance with ADLs?: Yes Independently performs ADLs?: Yes (appropriate for developmental age)  Abuse/Neglect [facility]) Physical Abuse: Denies Verbal Abuse: Denies Sexual Abuse: Denies  Prior Inpatient Therapy Prior Inpatient Therapy: Yes Prior Therapy Dates: 2010,2011,2012 Prior Therapy Facilty/Provider(s): [facility]; [facility]; [facility]; [facility], Reason for Treatment: SA  Prior Outpatient Therapy Prior Outpatient Therapy: Yes Prior Therapy Dates: ongoing Prior Therapy Facilty/Provider(s): [facility]  Reason for Treatment: Medication Management   ADL Screening (condition at time of admission) Patient's cognitive ability adequate to safely complete daily activities?: Yes Patient able to express need for assistance with ADLs?: Yes Independently performs ADLs?: Yes (appropriate for developmental age)       Abuse/Neglect Assessment (Assessment to be complete while patient is alone) Physical Abuse: Denies Verbal Abuse: Denies Sexual Abuse: Denies Values / Beliefs Cultural Requests During Hospitalization: None Spiritual Requests During Hospitalization: None  Additional Information 1:1 In Past 12 Months?: No CIRT Risk: No Elopement Risk: No Does patient have medical clearance?: Yes      Disposition: Pending [facility] Disposition Initial Assessment Completed for this Encounter: Yes Disposition of Patient: Referred to Patient referred to: Other (Comment)  On Site Evaluation by:   Reviewed with Physician:     Phillip Heal LaVerne 06/06/2012 4:08 PM

## 2012-06-06 NOTE — ED Provider Notes (Signed)
History     CSN: [id number]  Arrival date & time 06/06/12  1103   First MD Initiated Contact with Patient 06/06/12 1113      Chief Complaint  Patient presents with  . Alcohol Intoxication    (Consider location/radiation/quality/duration/timing/severity/associated sxs/prior treatment) Patient is a 41 y.o. male presenting with intoxication. The history is provided by the patient.  Alcohol Intoxication This is a chronic problem. Pertinent negatives include no chest pain, no abdominal pain, no headaches and no shortness of breath.   Patient presents requesting detox off of alcohol. He states he drinks 6-8 40 ounce beers a day he last drank last night. His previous history of withdrawal seizures and possibly other seizures 2. He states his hepatitis C.he denies other drug use currently. States that he previously abused Percocet. He has some depression but no suicidal thoughts. He's been draining heavily for 3 weeks.  Past Medical History  Diagnosis Date  . Hepatitis C   . Chronic back pain   . A-fib   . Depression   . Anxiety   . Hepatitis C     history of  . Chronic back pain   . Acid reflux   . History of urinary frequency   . Peripheral neuropathy     hands and feet  . Hypertension   . Polysubstance abuse 01/25/2011  . Alcohol abuse   . Seizures     Past Surgical History  Procedure Laterality Date  . Cardioversion  03/07/2006    History reviewed. No pertinent family history.  History  Substance Use Topics  . Smoking status: Current Every Day Smoker -- 1.00 packs/day for 25 years    Types: Cigarettes  . Smokeless tobacco: Never Used  . Alcohol Use: 0.0 oz/week     Comment: 1/5 Vodka; 1-40oz Daily       Review of Systems  Constitutional: Negative for activity change and appetite change.  HENT: Negative for neck stiffness.   Eyes: Negative for pain.  Respiratory: Negative for chest tightness and shortness of breath.   Cardiovascular: Negative for chest pain  and leg swelling.  Gastrointestinal: Negative for nausea, vomiting, abdominal pain and diarrhea.  Genitourinary: Positive for flank pain.  Musculoskeletal: Positive for back pain.  Skin: Negative for rash.  Neurological: Negative for weakness, numbness and headaches.  Psychiatric/Behavioral: Negative for behavioral problems.    Allergies  Review of patient's allergies indicates no known allergies.  Home Medications   Current Outpatient Rx  Name  Route  Sig  Dispense  Refill  . aspirin EC 81 MG tablet   Oral   Take 81 mg by mouth daily.         [name] FLUoxetine (PROZAC) 20 MG capsule   Oral   Take 20 mg by mouth daily.         . traZODone (DESYREL) 50 MG tablet   Oral   Take 1 tablet (50 mg total) by mouth at bedtime as needed for sleep or depression. For depression/sleep   30 tablet   0     BP 124/75  Pulse 89  Temp(Src) 98.1 F (36.7 C) (Oral)  Resp 18  SpO2 93%  Physical Exam  Nursing note and vitals reviewed. Constitutional: He is oriented to person, place, and time. He appears well-developed and well-nourished.  HENT:  Head: Normocephalic and atraumatic.  Eyes: EOM are normal. Pupils are equal, round, and reactive to light.  Neck: Normal range of motion. Neck supple.  Cardiovascular: Normal rate, regular rhythm and  normal heart sounds.   No murmur heard. Pulmonary/Chest: Effort normal and breath sounds normal.  Abdominal: Soft. Bowel sounds are normal. He exhibits no distension and no mass. There is no tenderness. There is no rebound and no guarding.  Musculoskeletal: Normal range of motion. He exhibits no edema.  Neurological: He is alert and oriented to person, place, and time. No cranial nerve deficit.  Skin: Skin is warm and dry.  Psychiatric: He has a normal mood and affect.    ED Course  Procedures (including critical care time)  Labs Reviewed  CBC WITH DIFFERENTIAL - Abnormal; Notable for the following:    Hemoglobin 17.9 (*)    MCHC 36.3 (*)     All other components within normal limits  COMPREHENSIVE METABOLIC PANEL - Abnormal; Notable for the following:    Glucose, Bld 108 (*)    All other components within normal limits  ETHANOL - Abnormal; Notable for the following:    Alcohol, Ethyl (B) 52 (*)    All other components within normal limits  SALICYLATE LEVEL - Abnormal; Notable for the following:    Salicylate Lvl <2.0 (*)    All other components within normal limits  URINE RAPID DRUG SCREEN (HOSP PERFORMED) - Abnormal; Notable for the following:    Cocaine POSITIVE (*)    All other components within normal limits  PHENYTOIN LEVEL, TOTAL - Abnormal; Notable for the following:    Phenytoin Lvl 0.6 (*)    All other components within normal limits  ACETAMINOPHEN LEVEL   No results found.   1. Alcohol dependence       MDM  Patient requesting alcohol detox. Also cocaine positive. He said previous withdrawal seizures but appears to be doing well at this time. He was started on the CIWA protocol and will be seen by ACT team. He is not suicidal        Juliet Rude. Rubin Payor, MD 06/06/12 1545

## 2012-06-06 NOTE — ED Notes (Signed)
Pt presents to department for evaluation of detox from ETOH. States he drinks (8) 40ox beers a day. Recent ETOH use last night, also states recent use of cocaine. Pt anxious and shaking upon arrival to ED. Pt is alert and oriented x4.

## 2012-06-07 ENCOUNTER — Encounter (HOSPITAL_COMMUNITY): Payer: Self-pay | Admitting: Unknown Physician Specialty

## 2012-06-07 NOTE — ED Notes (Signed)
Pt requested a happy meal with sprite to drink. Pt given a happy meal, sprite and a cup of ice.

## 2012-06-07 NOTE — BH Assessment (Signed)
Assessment Note   Blake Arnold is an 42 y.o. male. Reassessment of pt awaiting detox placement.  Pt continues to report withdrawal symptoms and CIWA is 13, as pt is reporting that he feels things crawling on his arm currently.  Pt also reports history or alcohol related siiezures, most recently 3 months ago.  Pt continues to report drinking 7-8 40 oz beers daily for past 3-4 weeks.  Pt reports stressors of contact with ex wife and inability to find employment due to 3 prior felony convictions.  Pt was in prison not too long ago.  Pt reports depression but denies SI/HI/AV.  Axis I: alcohol dependence Axis II: Deferred Axis III:  Past Medical History  Diagnosis Date  . Hepatitis C   . Chronic back pain   . A-fib   . Depression   . Anxiety   . Hepatitis C     history of  . Chronic back pain   . Acid reflux   . History of urinary frequency   . Peripheral neuropathy     hands and feet  . Hypertension   . Polysubstance abuse 01/25/2011  . Alcohol abuse   . Seizures    Axis IV: economic problems and problems with primary support group Axis V: 41-50 serious symptoms  Past Medical History:  Past Medical History  Diagnosis Date  . Hepatitis C   . Chronic back pain   . A-fib   . Depression   . Anxiety   . Hepatitis C     history of  . Chronic back pain   . Acid reflux   . History of urinary frequency   . Peripheral neuropathy     hands and feet  . Hypertension   . Polysubstance abuse 01/25/2011  . Alcohol abuse   . Seizures     Past Surgical History  Procedure Laterality Date  . Cardioversion  03/07/2006    Family History: History reviewed. No pertinent family history.  Social History:  reports that he has been smoking Cigarettes.  He has a 25 pack-year smoking history. He has never used smokeless tobacco. He reports that  drinks alcohol. He reports that he uses illicit drugs ("Crack" cocaine and Cocaine).  Additional Social History:  Alcohol / Drug Use Pain  Medications: Pt denies Prescriptions: Pt denies Over the Counter: Pt denies History of alcohol / drug use?: Yes Longest period of sobriety (when/how long): 1 month: Feb 2014 Negative Consequences of Use: Financial;Legal;Personal relationships Withdrawal Symptoms: Seizures;Tremors;Sweats;Blackouts Date of most recent seizure: 3 months ago Substance #1 Name of Substance 1: alcohol 1 - Age of First Use: 14 1 - Amount (size/oz): 7-8 40 oz beers 1 - Frequency: daily 1 - Duration: 3-4 weeks 1 - Last Use / Amount: 4/25, 8 40 oz beers Substance #2 Name of Substance 2: cocaine 2 - Age of First Use: 15 2 - Amount (size/oz): Pt denies regular use 2 - Last Use / Amount: 2 weeks ago, 1 line  CIWA: CIWA-Ar BP: 121/77 mmHg Pulse Rate: 85 Nausea and Vomiting: mild nausea with no vomiting Tactile Disturbances: moderately severe hallucinations Tremor: no tremor Auditory Disturbances: not present Paroxysmal Sweats: two Visual Disturbances: moderate sensitivity Anxiety: mildly anxious Headache, Fullness in Head: mild Agitation: normal activity Orientation and Clouding of Sensorium: oriented and can do serial additions CIWA-Ar Total: 13 COWS:    Allergies: No Known Allergies  Home Medications:  (Not in a hospital admission)  OB/GYN Status:  No LMP for male patient.  General  Assessment Data Location of Assessment: [facility] ED ACT Assessment: Yes Living Arrangements: Parent Can pt return to current living arrangement?: Yes Admission Status: Voluntary Is patient capable of signing voluntary admission?: Yes Transfer from: Acute Hospital Referral Source: Self/Family/Friend     Risk to self Suicidal Ideation: No Suicidal Intent: No Is patient at risk for suicide?: No Suicidal Plan?: No Access to Means: No What has been your use of drugs/alcohol within the last 12 months?: current significant alcohol use Previous Attempts/Gestures: No How many times?: 0 Other Self Harm Risks:  No Triggers for Past Attempts: None known Intentional Self Injurious Behavior: None Family Suicide History: No Recent stressful life event(s): Conflict (Comment);Financial Problems (problems with ex-wife, unable to find job due to criminal re) Persecutory voices/beliefs?: No Depression: Yes Depression Symptoms: Despondent;Isolating;Fatigue;Guilt;Loss of interest in usual pleasures;Feeling worthless/self pity;Feeling angry/irritable Substance abuse history and/or treatment for substance abuse?: Yes Suicide prevention information given to non-admitted patients: Not applicable  Risk to Others Homicidal Ideation: No Thoughts of Harm to Others: No Current Homicidal Intent: No Current Homicidal Plan: No Access to Homicidal Means: No Identified Victim: None  History of harm to others?: No Assessment of Violence: None Noted Violent Behavior Description: none  Does patient have access to weapons?: No Criminal Charges Pending?: No Does patient have a court date: No  Psychosis Hallucinations: None noted Delusions: None noted  Mental Status Report Appear/Hygiene: Other (Comment) (casual) Eye Contact: Good Motor Activity: Unremarkable Speech: Logical/coherent Level of Consciousness: Alert Mood: Other (Comment) (pleasant, cooperative) Affect: Appropriate to circumstance Anxiety Level: Minimal Thought Processes: Coherent;Relevant Judgement: Unimpaired Orientation: Person;Place;Time;Situation Obsessive Compulsive Thoughts/Behaviors: None  Cognitive Functioning Concentration: Normal Memory: Recent Intact;Remote Intact IQ: Average Insight: Good Impulse Control: Poor Appetite: Good Weight Loss: 0 Weight Gain: 0 Sleep: No Change Total Hours of Sleep: 5 Vegetative Symptoms: None  ADLScreening [facility] Assessment Services) Patient's cognitive ability adequate to safely complete daily activities?: Yes Patient able to express need for assistance with ADLs?: Yes Independently performs  ADLs?: Yes (appropriate for developmental age)  Abuse/Neglect [facility]) Physical Abuse: Denies Verbal Abuse: Denies Sexual Abuse: Denies  Prior Inpatient Therapy Prior Inpatient Therapy: Yes Prior Therapy Dates: 2010,2011,2012 Prior Therapy Facilty/Provider(s): [facility]; [facility]; [facility]; [facility], Reason for Treatment: SA  Prior Outpatient Therapy Prior Outpatient Therapy: Yes Prior Therapy Dates: ongoing Prior Therapy Facilty/Provider(s): [facility]  Reason for Treatment: Medication Management   ADL Screening (condition at time of admission) Patient's cognitive ability adequate to safely complete daily activities?: Yes Patient able to express need for assistance with ADLs?: Yes Independently performs ADLs?: Yes (appropriate for developmental age) Weakness of Legs: None Weakness of Arms/Hands: None  Home Assistive Devices/Equipment Home Assistive Devices/Equipment: None    Abuse/Neglect Assessment (Assessment to be complete while patient is alone) Physical Abuse: Denies Verbal Abuse: Denies Sexual Abuse: Denies Exploitation of patient/patient's resources: Denies Self-Neglect: Denies Values / Beliefs Cultural Requests During Hospitalization: None Spiritual Requests During Hospitalization: None   Advance Directives (For Healthcare) Advance Directive: Patient does not have advance directive;Patient would like information Patient requests advance directive information: Advance directive packet given Nutrition Screen- [facility] Adult/[facility]/AP Patient's home diet: Regular  Additional Information 1:1 In Past 12 Months?: No CIRT Risk: No Elopement Risk: No Does patient have medical clearance?: Yes     Disposition:  Disposition Initial Assessment Completed for this Encounter: Yes Disposition of Patient: Referred to Patient referred to: Other (Comment)  On Site Evaluation by:   Reviewed with Physician:     Lorri Frederick 06/07/2012 9:46 PM

## 2012-06-07 NOTE — [facility] Counselor (Signed)
Blake Arnold, ACT counselor at [facility], submitted Pt for admission to [facility]. Gave clinical report to Dr. Ceasar Mons who accepted Pt to the service of Dr. Jola Baptist, room 300-1. Notified Blake Arnold of acceptance.  Harlin Rain Patsy Baltimore, LPC, [facility] Assessment Counselor

## 2012-06-07 NOTE — ED Notes (Signed)
Called report to [place] at [facility]. Security paged for transport.

## 2012-06-08 ENCOUNTER — Encounter (HOSPITAL_COMMUNITY): Payer: Self-pay

## 2012-06-08 ENCOUNTER — Inpatient Hospital Stay (HOSPITAL_COMMUNITY)
Admission: AD | Admit: 2012-06-08 | Discharge: 2012-06-11 | DRG: 897 | Disposition: A | Discharge status: Home / Self Care | Payer: No Typology Code available for payment source | Source: Intra-hospital | Attending: Psychiatry | Admitting: Psychiatry

## 2012-06-08 DIAGNOSIS — F1410 Cocaine abuse, uncomplicated: Secondary | ICD-10-CM

## 2012-06-08 DIAGNOSIS — M549 Dorsalgia, unspecified: Secondary | ICD-10-CM | POA: Diagnosis present

## 2012-06-08 DIAGNOSIS — I10 Essential (primary) hypertension: Secondary | ICD-10-CM | POA: Diagnosis present

## 2012-06-08 DIAGNOSIS — F329 Major depressive disorder, single episode, unspecified: Secondary | ICD-10-CM | POA: Diagnosis present

## 2012-06-08 DIAGNOSIS — I4891 Unspecified atrial fibrillation: Secondary | ICD-10-CM | POA: Diagnosis present

## 2012-06-08 DIAGNOSIS — Z79899 Other long term (current) drug therapy: Secondary | ICD-10-CM

## 2012-06-08 DIAGNOSIS — G8929 Other chronic pain: Secondary | ICD-10-CM | POA: Diagnosis present

## 2012-06-08 DIAGNOSIS — F10239 Alcohol dependence with withdrawal, unspecified: Secondary | ICD-10-CM

## 2012-06-08 DIAGNOSIS — B1920 Unspecified viral hepatitis C without hepatic coma: Secondary | ICD-10-CM | POA: Diagnosis present

## 2012-06-08 DIAGNOSIS — F1020 Alcohol dependence, uncomplicated: Principal | ICD-10-CM | POA: Diagnosis present

## 2012-06-08 MED ORDER — CHLORDIAZEPOXIDE HCL 25 MG PO CAPS: 25.0000 mg | ORAL_CAPSULE | Freq: Every day | ORAL | Status: DC

## 2012-06-08 MED ORDER — ACETAMINOPHEN 325 MG PO TABS
650.0000 mg | ORAL_TABLET | Freq: Four times a day (QID) | ORAL | Status: DC | PRN
  Administered 2012-06-08: 650 mg via ORAL

## 2012-06-08 MED ORDER — CHLORDIAZEPOXIDE HCL 25 MG PO CAPS
25.0000 mg | ORAL_CAPSULE | Freq: Three times a day (TID) | ORAL | Status: AC
  Administered 2012-06-09 – 2012-06-10 (×3): 25 mg via ORAL
  Filled 2012-06-08 (×3): qty 1

## 2012-06-08 MED ORDER — THIAMINE HCL 100 MG/ML IJ SOLN
100.0000 mg | Freq: Once | INTRAMUSCULAR | Status: AC
  Administered 2012-06-08: 100 mg via INTRAMUSCULAR

## 2012-06-08 MED ORDER — MAGNESIUM HYDROXIDE 400 MG/5ML PO SUSP: 30.0000 mL | Freq: Every day | ORAL | Status: DC | PRN

## 2012-06-08 MED ORDER — ASPIRIN 81 MG PO CHEW
81.0000 mg | CHEWABLE_TABLET | Freq: Every day | ORAL | Status: DC
  Administered 2012-06-08 – 2012-06-11 (×4): 81 mg via ORAL
  Filled 2012-06-08 (×7): qty 1

## 2012-06-08 MED ORDER — ONDANSETRON 4 MG PO TBDP: 4.0000 mg | ORAL_TABLET | Freq: Four times a day (QID) | ORAL | Status: AC | PRN

## 2012-06-08 MED ORDER — ALUM & MAG HYDROXIDE-SIMETH 200-200-20 MG/5ML PO SUSP
30.0000 mL | ORAL | Status: DC | PRN
  Administered 2012-06-10: 30 mL via ORAL

## 2012-06-08 MED ORDER — TRAZODONE HCL 50 MG PO TABS
50.0000 mg | ORAL_TABLET | Freq: Every evening | ORAL | Status: DC | PRN
  Administered 2012-06-08 – 2012-06-10 (×4): 50 mg via ORAL
  Filled 2012-06-08: qty 1
  Filled 2012-06-08: qty 14
  Filled 2012-06-08 (×3): qty 1

## 2012-06-08 MED ORDER — LOPERAMIDE HCL 2 MG PO CAPS: 2.0000 mg | ORAL_CAPSULE | ORAL | Status: AC | PRN

## 2012-06-08 MED ORDER — FLUOXETINE HCL 20 MG PO CAPS
20.0000 mg | ORAL_CAPSULE | Freq: Every day | ORAL | Status: DC
  Administered 2012-06-08: 20 mg via ORAL
  Filled 2012-06-08 (×3): qty 1

## 2012-06-08 MED ORDER — VITAMIN B-1 100 MG PO TABS
100.0000 mg | ORAL_TABLET | Freq: Every day | ORAL | Status: DC
  Administered 2012-06-09 – 2012-06-11 (×3): 100 mg via ORAL
  Filled 2012-06-08 (×5): qty 1

## 2012-06-08 MED ORDER — CHLORDIAZEPOXIDE HCL 25 MG PO CAPS
25.0000 mg | ORAL_CAPSULE | ORAL | Status: AC
  Administered 2012-06-10 – 2012-06-11 (×2): 25 mg via ORAL
  Filled 2012-06-08 (×2): qty 1

## 2012-06-08 MED ORDER — CHLORDIAZEPOXIDE HCL 25 MG PO CAPS
25.0000 mg | ORAL_CAPSULE | Freq: Four times a day (QID) | ORAL | Status: AC
  Administered 2012-06-08 – 2012-06-09 (×6): 25 mg via ORAL
  Filled 2012-06-08 (×6): qty 1

## 2012-06-08 MED ORDER — CHLORDIAZEPOXIDE HCL 25 MG PO CAPS: 25.0000 mg | ORAL_CAPSULE | Freq: Four times a day (QID) | ORAL | Status: AC | PRN

## 2012-06-08 MED ORDER — CHLORDIAZEPOXIDE HCL 25 MG PO CAPS
25.0000 mg | ORAL_CAPSULE | Freq: Once | ORAL | Status: AC
  Administered 2012-06-08: 25 mg via ORAL
  Filled 2012-06-08: qty 1

## 2012-06-08 MED ORDER — HYDROXYZINE HCL 25 MG PO TABS
25.0000 mg | ORAL_TABLET | Freq: Four times a day (QID) | ORAL | Status: AC | PRN
  Administered 2012-06-08: 25 mg via ORAL

## 2012-06-08 MED ORDER — ADULT MULTIVITAMIN W/MINERALS CH
1.0000 | ORAL_TABLET | Freq: Every day | ORAL | Status: DC
  Administered 2012-06-08 – 2012-06-11 (×4): 1 via ORAL
  Filled 2012-06-08 (×7): qty 1

## 2012-06-08 NOTE — Progress Notes (Signed)
Pt attended the latter half of the AA group after first refusing to go.

## 2012-06-08 NOTE — [facility] Group Notes (Signed)
[facility] LCSW Group Therapy  06/08/2012 1:15 PM  Type of Therapy:  Group Therapy 1:15 to 2:30 PM  Participation Level:  Did Not Attend    Blake Arnold  

## 2012-06-08 NOTE — Tx Team (Signed)
Initial Interdisciplinary Treatment Plan  PATIENT STRENGTHS: (choose at least two) Ability for insight Active sense of humor Motivation for treatment/growth Supportive family/friends  PATIENT STRESSORS: Financial difficulties Legal issue Medication change or noncompliance   PROBLEM LIST: Problem List/Patient Goals Date to be addressed Date deferred Reason deferred Estimated date of resolution  Etoh abuse 06/08/2012     Depression                  Goal- Pt wants to find treatment for his alcohol and depression, plans to continue with [facility] and family services. Patient reports [facility] a state program has been helpful in the past. Patient reports he would like to find employment.                               DISCHARGE CRITERIA:  Ability to meet basic life and health needs Adequate post-discharge living arrangements Improved stabilization in mood, thinking, and/or behavior Medical problems require only outpatient monitoring Motivation to continue treatment in a less acute level of care Need for constant or close observation no longer present Safe-care adequate arrangements made Withdrawal symptoms are absent or subacute and managed without 24-hour nursing intervention  PRELIMINARY DISCHARGE PLAN: Outpatient therapy  PATIENT/FAMIILY INVOLVEMENT: This treatment plan has been presented to and reviewed with the patient, [name], and/or family member.  The patient and family have been given the opportunity to ask questions and make suggestions.  Floyce Stakes 06/08/2012, 12:42 AM

## 2012-06-08 NOTE — B[facility] Counselor (Signed)
Adult Psychosocial Assessment Update Interdisciplinary Team  Previous [facility] admissions/discharges:  Admissions Discharges  Date: 04/02/12 Date:04/07/12  Date: Date:  Date: Date:  Date: Date:  Date: Date:   Changes since the last Psychosocial Assessment (including adherence to outpatient mental health and/or substance abuse treatment, situational issues contributing to decompensation and/or relapse). Patient was discharged in February of this year to followup at [facility] after refusing  Options of inpatient or IOP. Patient reports he did follow up at [facility] and relapses on  Alcohol in mid March. He also goes to [facility] service of the [place] where he sees  Malachi Pro for therapy. He has been working odd jobs to support self and drinking 7+  40 oz beers daily. Patient reports he has a PO officer and is also attending GO program     Discharge Plan 1. Will you be returning to the same living situation after discharge?   Yes: No:  X    If no, what is your plan?    Patient wants to go to treatment before returning home       2. Would you like a referral for services when you are discharged? Yes:  X   If yes, for what services?  No:       Patient reports desire to go to [facility]; also reports PO and GO instructor are both   Trying to get him admitted.     Summary and Recommendations (to be completed by the evaluator) Patient is 42 YO unemployeed seperated caucasian male admitted with diagnosis of   Alcohol Dependence and Depressive Disorder. Patient will benefit from crisis stabili-  Zation, medication evaluation, group therapy and psychoeducation in addition to case  Management for discharge planning.                 Signature:  Clide Dales, 06/09/2012 12:20 PM

## 2012-06-08 NOTE — Progress Notes (Signed)
Patient admitted voluntarily requesting ETOH detox. Patient reports that he drinks daily and averages 6-7 (40 oz). Patient reports he has been non compliant with his medication reports that it makes him feel very jittery. Patient reports feeling depressed due to his unemployment. Patient reoprted that his trigger for drinking recently was that his ex-wife contacted him. Patient is currently on probation and is aware that he is here. Patient was pleasant and cooperative. Patient denies si/hi/a/v hallucinations. Medical hx include Hep C, htn, seizures, chronic back pain, a-fib, and hx of urinary frequency. Patient oriented to unit, meal provided and medications given. Safety maintained with 15 min checks.

## 2012-06-08 NOTE — H&P (Signed)
Psychiatric Admission Assessment Adult  Patient Identification:  Blake Arnold Date of Evaluation:  06/08/2012 Chief Complaint:  ETOH DEPENDENCE History of Present Illness:: Let this unit February the 25 th. Staid abstinent for two or three weeks. We to [facility], the [facility] program.Talked to his partner she told him she anted to be back together. She was drinking and using drugs. States that this interaction and a friend asking him to drink "just one airplane bottle.." build up to 7-8 40 ounces a day. States he was hiding it from his family. Eventually they found out. He wen to the [facility] program blew positive. He might be violated. He states the probation officer is willing to work with him. Last drink Friday night. States he did a line of cocaine. States he felt he was ready to have a seizure when he was in the ED. Has had withdrawal seizures before, Went off the Dilantin. He was "shaky," thought it was the Prozac or the Trazodone, went off both.  Elements:  Location:  in patient. Quality:  unbale to function. Severity:  moderate to severe. Timing:  every day. Duration: last four weeks Context: Alcohol dependent, relapsed Associated Signs/Synptoms: Depression Symptoms:  depressed mood, anhedonia, insomnia, fatigue, anxiety, panic attacks, disturbed sleep, (Hypo) Manic Symptoms:  Denies Anxiety Symptoms:  Excessive Worry, Panic Symptoms, Psychotic Symptoms:  Denies PTSD Symptoms: Denies   Psychiatric Specialty Exam: Physical Exam  Review of Systems  Constitutional: Positive for diaphoresis.  HENT: Positive for neck pain.   Eyes: Positive for blurred vision.  Respiratory: Positive for cough.        Pack a day  Cardiovascular: Positive for chest pain and palpitations.  Gastrointestinal: Positive for nausea.  Genitourinary: Negative.   Musculoskeletal: Positive for back pain and joint pain.  Skin: Negative.   Neurological: Positive for headaches.  Endo/Heme/Allergies: Negative.    Psychiatric/Behavioral: Positive for depression and substance abuse. The patient is nervous/anxious and has insomnia.     Blood pressure 143/88, pulse 76, temperature 98 F (36.7 C), temperature source Oral, resp. rate 22, height 5\' 8"  (1.727 m), weight 88.451 kg (195 lb).Body mass index is 29.66 kg/(m^2).  General Appearance: Disheveled  Eye Solicitor::  Fair  Speech:  Clear and Coherent and Slow  Volume:  Decreased  Mood:  Anxious and Depressed  Affect:  Restricted  Thought Process:  Coherent and Goal Directed  Orientation:  Full (Time, Place, and Person)  Thought Content:  worreis, concerns  Suicidal Thoughts:  No  Homicidal Thoughts:  No  Memory:  Immediate;   Fair  Judgement:  Fair  Insight:  Shallow  Psychomotor Activity:  Restlessness  Concentration:  Fair  Recall:  Fair  Akathisia:  No  Handed:  Right  AIMS (if indicated):     Assets:  Desire for Improvement  Sleep:  Number of Hours: 3.5    Past Psychiatric History: Diagnosis: Alcohol Dependence, Cocaine Abuse, Major Depression, Anxiety Disorder NOS  Hospitalizations: [facility]  Outpatient Care: [facility]  Substance Abuse Care: [facility], [facility], [facility], [facility]  Self-Mutilation: Denies  Suicidal Attempts:Denies  Violent Behaviors:When drinking or doing cocaine   Past Medical History:   Past Medical History  Diagnosis Date  . Hepatitis C   . Chronic back pain   . A-fib   . Depression   . Anxiety   . Hepatitis C     history of  . Chronic back pain   . Acid reflux   . History of urinary frequency   . Peripheral neuropathy  hands and feet  . Hypertension   . Polysubstance abuse 01/25/2011  . Alcohol abuse   . Seizures    Seizure History:  withdrawal, was on Dilantin Allergies:  No Known Allergies PTA Medications: Prescriptions prior to admission  Medication Sig Dispense Refill  . aspirin EC 81 MG tablet Take 81 mg by mouth daily.      [name] FLUoxetine (PROZAC) 20 MG capsule Take 20 mg by mouth daily.      .  traZODone (DESYREL) 50 MG tablet Take 1 tablet (50 mg total) by mouth at bedtime as needed for sleep or depression. For depression/sleep  30 tablet  0    Previous Psychotropic Medications:  Medication/Dose  Prozac, Trazodone, Dilantin               Substance Abuse History in the last 12 months:  yes  Consequences of Substance Abuse: Legal Consequences:  DWI, other drug related Withdrawal Symptoms:   Diaphoresis Headaches Nausea Tremors Vomiting  Social History:  reports that he has been smoking Cigarettes.  He has a 25 pack-year smoking history. He has never used smokeless tobacco. He reports that  drinks alcohol. He reports that he uses illicit drugs ("Crack" cocaine and Cocaine). Additional Social History:                      Current Place of Residence:   Place of Birth:   Family Members: Marital Status:  common law marriage Children: Common in Social worker wife's kids  Sons:  Daughters: Relationships: Education:  10 th grade Educational Problems/Performance: Religious Beliefs/Practices: History of Abuse (Emotional/Phsycial/Sexual) Occupational Experiences; Holiday representative, office Chartered certified accountant History:  None. Legal History:B and E, Larceny, Violation of Probation ( 6 months prison) Hobbies/Interests:  Family History:  History reviewed. No pertinent family history.  Results for orders placed during the hospital encounter of 06/06/12 (from the past 72 hour(s))  CBC WITH DIFFERENTIAL     Status: Abnormal   Collection Time    06/06/12 11:22 AM      Result Value Range   WBC 9.1  4.0 - 10.5 K/uL   RBC 5.48  4.22 - 5.81 MIL/uL   Hemoglobin 17.9 (*) 13.0 - 17.0 g/dL   HCT 16.1  09.6 - 04.5 %   MCV 90.0  78.0 - 100.0 fL   MCH 32.7  26.0 - 34.0 pg   MCHC 36.3 (*) 30.0 - 36.0 g/dL   RDW 40.9  81.1 - 91.4 %   Platelets 237  150 - 400 K/uL   Neutrophils Relative 60  43 - 77 %   Neutro Abs 5.5  1.7 - 7.7 K/uL   Lymphocytes Relative 30  12 - 46 %    Lymphs Abs 2.7  0.7 - 4.0 K/uL   Monocytes Relative 7  3 - 12 %   Monocytes Absolute 0.6  0.1 - 1.0 K/uL   Eosinophils Relative 2  0 - 5 %   Eosinophils Absolute 0.2  0.0 - 0.7 K/uL   Basophils Relative 1  0 - 1 %   Basophils Absolute 0.1  0.0 - 0.1 K/uL  COMPREHENSIVE METABOLIC PANEL     Status: Abnormal   Collection Time    06/06/12 11:22 AM      Result Value Range   Sodium 135  135 - 145 mEq/L   Potassium 4.3  3.5 - 5.1 mEq/L   Chloride 98  96 - 112 mEq/L   CO2 24  19 - 32 mEq/L  Glucose, Bld 108 (*) 70 - 99 mg/dL   BUN 9  6 - 23 mg/dL   Creatinine, Ser 1.61  0.50 - 1.35 mg/dL   Calcium 9.3  8.4 - 09.6 mg/dL   Total Protein 8.3  6.0 - 8.3 g/dL   Albumin 4.1  3.5 - 5.2 g/dL   AST 28  0 - 37 U/L   ALT 33  0 - 53 U/L   Alkaline Phosphatase 82  39 - 117 U/L   Total Bilirubin 0.4  0.3 - 1.2 mg/dL   GFR calc non Af Amer >90  >90 mL/min   GFR calc Af Amer >90  >90 mL/min   Comment:            The eGFR has been calculated     using the CKD EPI equation.     This calculation has not been     validated in all clinical     situations.     eGFR's persistently     <90 mL/min signify     possible Chronic Kidney Disease.  ETHANOL     Status: Abnormal   Collection Time    06/06/12 11:22 AM      Result Value Range   Alcohol, Ethyl (B) 52 (*) 0 - 11 mg/dL   Comment:            LOWEST DETECTABLE LIMIT FOR     SERUM ALCOHOL IS 11 mg/dL     FOR MEDICAL PURPOSES ONLY  ACETAMINOPHEN LEVEL     Status: None   Collection Time    06/06/12 11:22 AM      Result Value Range   Acetaminophen (Tylenol), Serum <15.0  10 - 30 ug/mL   Comment:            THERAPEUTIC CONCENTRATIONS VARY     SIGNIFICANTLY. A RANGE OF 10-30     ug/mL MAY BE AN EFFECTIVE     CONCENTRATION FOR MANY PATIENTS.     HOWEVER, SOME ARE BEST TREATED     AT CONCENTRATIONS OUTSIDE THIS     RANGE.     ACETAMINOPHEN CONCENTRATIONS     >150 ug/mL AT 4 HOURS AFTER     INGESTION AND >50 ug/mL AT 12     HOURS AFTER  INGESTION ARE     OFTEN ASSOCIATED WITH TOXIC     REACTIONS.  SALICYLATE LEVEL     Status: Abnormal   Collection Time    06/06/12 11:22 AM      Result Value Range   Salicylate Lvl <2.0 (*) 2.8 - 20.0 mg/dL  PHENYTOIN LEVEL, TOTAL     Status: Abnormal   Collection Time    06/06/12 11:22 AM      Result Value Range   Phenytoin Lvl 0.6 (*) 10.0 - 20.0 ug/mL  URINE RAPID DRUG SCREEN (HOSP PERFORMED)     Status: Abnormal   Collection Time    06/06/12 11:31 AM      Result Value Range   Opiates NONE DETECTED  NONE DETECTED   Cocaine POSITIVE (*) NONE DETECTED   Benzodiazepines NONE DETECTED  NONE DETECTED   Amphetamines NONE DETECTED  NONE DETECTED   Tetrahydrocannabinol NONE DETECTED  NONE DETECTED   Barbiturates NONE DETECTED  NONE DETECTED   Comment:            DRUG SCREEN FOR MEDICAL PURPOSES     ONLY.  IF CONFIRMATION IS NEEDED     FOR ANY PURPOSE, NOTIFY LAB  WITHIN 5 DAYS.                LOWEST DETECTABLE LIMITS     FOR URINE DRUG SCREEN     Drug Class       Cutoff (ng/mL)     Amphetamine      1000     Barbiturate      200     Benzodiazepine   200     Tricyclics       300     Opiates          300     Cocaine          300     THC              50   Psychological Evaluations:  Assessment:   AXIS I:  Alcohol Dependence, withdrawal, cocaine abuse, depressive disorder NOS AXIS II:  Deferred AXIS III:   Past Medical History  Diagnosis Date  . Hepatitis C   . Chronic back pain   . A-fib   . Depression   . Anxiety   . Hepatitis C     history of  . Chronic back pain   . Acid reflux   . History of urinary frequency   . Peripheral neuropathy     hands and feet  . Hypertension   . Polysubstance abuse 01/25/2011  . Alcohol abuse   . Seizures    AXIS IV:  other psychosocial or environmental problems, problems related to legal system/crime and problems with primary support group AXIS V:  41-50 serious symptoms  Treatment Plan/Recommendations:  Supportive  approach/coping skills/relapse prevention                                                                 Reassess the comorbidities  Treatment Plan Summary: Daily contact with patient to assess and evaluate symptoms and progress in treatment Medication management Current Medications:  Current Facility-Administered Medications  Medication Dose Route Frequency Provider Last Rate Last Dose  . acetaminophen (TYLENOL) tablet 650 mg  650 mg Oral Q6H PRN Larena Sox, MD   650 mg at 06/08/12 0102  . alum & mag hydroxide-simeth (MAALOX/MYLANTA) 200-200-20 MG/5ML suspension 30 mL  30 mL Oral Q4H PRN Larena Sox, MD      . aspirin chewable tablet 81 mg  81 mg Oral Daily Larena Sox, MD   81 mg at 06/08/12 0811  . chlordiazePOXIDE (LIBRIUM) capsule 25 mg  25 mg Oral Q6H PRN Larena Sox, MD      . chlordiazePOXIDE (LIBRIUM) capsule 25 mg  25 mg Oral QID Larena Sox, MD   25 mg at 06/08/12 9629   Followed by  . [START ON 06/09/2012] chlordiazePOXIDE (LIBRIUM) capsule 25 mg  25 mg Oral TID Larena Sox, MD       Followed by  . [START ON 06/10/2012] chlordiazePOXIDE (LIBRIUM) capsule 25 mg  25 mg Oral BH-qamhs Larena Sox, MD       Followed by  . [START ON 06/12/2012] chlordiazePOXIDE (LIBRIUM) capsule 25 mg  25 mg Oral Daily Larena Sox, MD      . FLUoxetine (PROZAC) capsule 20 mg  20 mg Oral Daily Larena Sox, MD  20 mg at 06/08/12 0812  . hydrOXYzine (ATARAX/VISTARIL) tablet 25 mg  25 mg Oral Q6H PRN Larena Sox, MD   25 mg at 06/08/12 0102  . loperamide (IMODIUM) capsule 2-4 mg  2-4 mg Oral PRN Larena Sox, MD      . magnesium hydroxide (MILK OF MAGNESIA) suspension 30 mL  30 mL Oral Daily PRN Larena Sox, MD      . multivitamin with minerals tablet 1 tablet  1 tablet Oral Daily Larena Sox, MD   1 tablet at 06/08/12 [phone]  . ondansetron (ZOFRAN-ODT) disintegrating tablet 4 mg  4 mg Oral Q6H PRN Larena Sox, MD      . Melene Muller ON  06/09/2012] thiamine (VITAMIN B-1) tablet 100 mg  100 mg Oral Daily Larena Sox, MD      . traZODone (DESYREL) tablet 50 mg  50 mg Oral QHS PRN Larena Sox, MD   50 mg at 06/08/12 0101    Observation Level/Precautions:  Detox 15 minute checks  Laboratory:  As per the ED  Psychotherapy:  Individual/group  Medications:  Librium Detox protocol, reassess co morbidites  Consultations:    Discharge Concerns:    Estimated LOS: 3-5 days  Other:     I certify that inpatient services furnished can reasonably be expected to improve the patient's condition.   Gennie Dib A 4/28/201411:35 AM

## 2012-06-08 NOTE — BHH Suicide Risk Assessment (Signed)
Suicide Risk Assessment  Admission Assessment     Nursing information obtained from:  Patient Demographic factors:  Male;Caucasian;Low socioeconomic status;Unemployed Current Mental Status:  NA Loss Factors:  Decrease in vocational status;Loss of significant relationship;Decline in physical health;Legal issues;Financial problems / change in socioeconomic status Historical Factors:  Family history of mental illness or substance abuse Risk Reduction Factors:  Sense of responsibility to family;Religious beliefs about death;Living with another person, especially a relative;Positive social support;Positive coping skills or problem solving skills  CLINICAL FACTORS:   Alcohol/Substance Abuse/Dependencies  COGNITIVE FEATURES THAT CONTRIBUTE TO RISK:  Closed-mindedness Polarized thinking Thought constriction (tunnel vision)    SUICIDE RISK:   Moderate:  Frequent suicidal ideation with limited intensity, and duration, some specificity in terms of plans, no associated intent, good self-control, limited dysphoria/symptomatology, some risk factors present, and identifiable protective factors, including available and accessible social support.  PLAN OF CARE: Supportive approach/coping skills/relapse prevention                               Reassess co morbidities  I certify that inpatient services furnished can reasonably be expected to improve the patient's condition.  Domenik Trice A 06/08/2012, 3:43 PM

## 2012-06-08 NOTE — Progress Notes (Signed)
Adult Psychoeducational Group Note  Date:  06/08/2012 Time:  1:40 PM  Group Topic/Focus:  Self Care:   The focus of this group is to help patients understand the importance of self-care in order to improve or restore emotional, physical, spiritual, interpersonal, and financial health.  Participation Level:  Active  Participation Quality:  Appropriate, Attentive and Sharing  Affect:  Appropriate  Cognitive:  Appropriate  Insight: Appropriate and Good  Engagement in Group:  Engaged  Modes of Intervention:  Activity, Discussion and Education  Additional Comments:  Pt. Participated in self-care assessment activity and recognized his strengths and areas for growth. Pt. Recognized changes he could make to improve his self-care.   Ruta Hinds [facility] 06/08/2012, 1:40 PM

## 2012-06-08 NOTE — [facility] Group Notes (Signed)
[facility] LCSW Aftercare Discharge Planning Group Note   06/08/2012 8:45 AM  Participation Quality:  Did Not Attend   Clide Dales

## 2012-06-09 DIAGNOSIS — F329 Major depressive disorder, single episode, unspecified: Secondary | ICD-10-CM

## 2012-06-09 MED ORDER — MELOXICAM 7.5 MG PO TABS
7.5000 mg | ORAL_TABLET | Freq: Two times a day (BID) | ORAL | Status: DC
  Administered 2012-06-09 – 2012-06-11 (×5): 7.5 mg via ORAL
  Filled 2012-06-09 (×2): qty 1
  Filled 2012-06-09: qty 28
  Filled 2012-06-09 (×4): qty 1
  Filled 2012-06-09: qty 28
  Filled 2012-06-09 (×2): qty 1

## 2012-06-09 MED ORDER — DIVALPROEX SODIUM ER 250 MG PO TB24
250.0000 mg | ORAL_TABLET | Freq: Every day | ORAL | Status: DC
  Administered 2012-06-09 – 2012-06-11 (×3): 250 mg via ORAL
  Filled 2012-06-09 (×3): qty 1
  Filled 2012-06-09: qty 56
  Filled 2012-06-09 (×2): qty 1

## 2012-06-09 MED ORDER — DIVALPROEX SODIUM ER 500 MG PO TB24
500.0000 mg | ORAL_TABLET | Freq: Every day | ORAL | Status: DC
  Administered 2012-06-09: 500 mg via ORAL
  Filled 2012-06-09 (×3): qty 1

## 2012-06-09 NOTE — Progress Notes (Signed)
Adult Psychoeducational Group Note  Date:  06/09/2012 Time:  10:10 PM  Group Topic/Focus:  Wrap-Up Group:   The focus of this group is to help patients review their daily goal of treatment and discuss progress on daily workbooks.  Participation Level:  Active  Participation Quality:  Appropriate and Attentive  Affect:  Appropriate  Cognitive:  Appropriate  Insight: Good  Engagement in Group:  Engaged  Modes of Intervention:  Discussion and Exploration  Additional Comments:  Pt went into detail about his triggers which are issues with his family. Pt talked about family history of addiction and how he has to stay away from certain family members in order to stay sober.   Humberto Seals Monique 06/09/2012, 10:10 PM

## 2012-06-09 NOTE — Progress Notes (Signed)
Adult Psychoeducational Group Note  Date:  06/09/2012 Time:  10:58 AM  Group Topic/Focus:  Recovery Goals:   The focus of this group is to identify appropriate goals for recovery and establish a plan to achieve them.  Participation Level:  Did Not Attend  Participation Quality:    Affect:    Cognitive:    Insight:   Engagement in Group:    Modes of Intervention:    Additional Comments:  Pt was in the bed, pt refused to attend.  Isla Pence M 06/09/2012, 10:58 AM

## 2012-06-09 NOTE — Progress Notes (Signed)
[facility] LCSW Aftercare Discharge Planning Group Note   06/09/2012 8:45 AM  Participation Quality:  Did Not Attend  Harrill, Catherine Campbell    

## 2012-06-09 NOTE — Progress Notes (Signed)
D: Patient denies SI/HI and A/V hallucinations; patient reports sleep is well after getting the medication to help him sleep; reports appetite isgood  ; reports energy level is low ; reports ability to pay attention is good; rates depression as 3/10; rates hopelessness 4/10; complains of tremors, chills, and agitation  A: Monitored q 15 minutes; patient encouraged to attend groups; patient educated about medications; patient given medications per physician orders; patient encouraged to express feelings and/or concerns  R: Patient is cooperative, appropriate to circumstances and reports some decrease in withdrawal symptoms;  patient's interaction with staff and peers is appropriate; patient is taking medications as prescribed and tolerating medications; patient is attending some groups

## 2012-06-09 NOTE — [facility] Group Notes (Signed)
[facility] LCSW Group Therapy  06/09/2012 1:15 PM  Type of Therapy:  Group Therapy 1:15 to 2:30 PM  Participation Level:  Active  Participation Quality:  Appropriate, Attentive and Sharing  Affect:  Appropriate  Cognitive:  Appropriate  Insight:  Developing/Improving  Engagement in Therapy:  Developing/Improving  Modes of Intervention:  Discussion, Exploration, Socialization and Support  Summary of Progress/Problems: Patient attended group presentation by staff member of  [facility] ([facility]). Kyheem was attentive and appropriate during session.  He was able to process how help had been offered before he wanted it and shared "desire to have the same now that I really want it."  Jo shared several of his past rationalizations to use that he cannot even understand today.   Clide Dales

## 2012-06-09 NOTE — Progress Notes (Signed)
Pt reports he is doing better this evening.  He has been up this afternoon.  He is observed in the dayroom watching TV and interacting with his peers.  He says his withdrawal symptoms are decreasing.  He denies SI/HI/AV.  He says he will probably go to rehab from here.  He says he wants to stay sober this time.  He was started on medications today for his chronic back pain and hx seizures.  He has been pleasant/cooperative.  He was encouraged to make his needs known to staff.  Pt voiced understanding.  Pt voices no needs/concerns at this time.  Support and encouragement offered.  Safety maintained with q15 minute checks.

## 2012-06-09 NOTE — Progress Notes (Signed)
[facility] MD Progress Note  06/09/2012 4:32 PM Blake Arnold  MRN:  [id number] Subjective:  Blake Arnold endorses that he is having a hard time with back pain. He had discontinued the Dilantin as he reports side effects. He states that he used to have seizures even before he started drinking and started having drug withdrawal ones. He is willing to try another anticonvulsant. He is trying to coordinate with the probation offices where to go from here. He is being requested to go to rehab by the legal system Diagnosis:  Alcohol Dependence, Major Depression  ADL's:  Intact  Sleep: Fair  Appetite:  Poor  Suicidal Ideation:  Plan:  denies Intent:  denies Means:  denies Homicidal Ideation:  Plan:  denies Intent:  denies Means:  denies AEB (as evidenced by):  Psychiatric Specialty Exam: Review of Systems  Constitutional: Negative.   HENT: Negative.   Eyes: Negative.   Respiratory: Negative.   Cardiovascular: Negative.   Gastrointestinal: Negative.   Genitourinary: Negative.   Musculoskeletal: Positive for back pain and joint pain.  Skin: Negative.   Neurological: Negative.   Endo/Heme/Allergies: Negative.   Psychiatric/Behavioral: Positive for substance abuse. The patient is nervous/anxious and has insomnia.     Blood pressure 123/85, pulse 79, temperature 98 F (36.7 C), temperature source Oral, resp. rate 16, height 5\' 8"  (1.727 m), weight 88.451 kg (195 lb).Body mass index is 29.66 kg/(m^2).  General Appearance: Disheveled  Eye Solicitor::  Fair  Speech:  Clear and Coherent  Volume:  fluctuates  Mood:  Anxious, Depressed and worried  Affect:  Restricted  Thought Process:  Coherent and Goal Directed  Orientation:  Full (Time, Place, and Person)  Thought Content:  worries, concerns  Suicidal Thoughts:  No  Homicidal Thoughts:  No  Memory:  Immediate;   Fair Recent;   Fair Remote;   Fair  Judgement:  Fair  Insight:  Shallow  Psychomotor Activity:  Restlessness  Concentration:   Fair  Recall:  Fair  Akathisia:  No  Handed:  Right  AIMS (if indicated):     Assets:  Communication Skills  Sleep:  Number of Hours: 6.5   Current Medications: Current Facility-Administered Medications  Medication Dose Route Frequency Provider Last Rate Last Dose  . acetaminophen (TYLENOL) tablet 650 mg  650 mg Oral Q6H PRN Larena Sox, MD   650 mg at 06/08/12 0102  . alum & mag hydroxide-simeth (MAALOX/MYLANTA) 200-200-20 MG/5ML suspension 30 mL  30 mL Oral Q4H PRN Larena Sox, MD      . aspirin chewable tablet 81 mg  81 mg Oral Daily Larena Sox, MD   81 mg at 06/09/12 0753  . chlordiazePOXIDE (LIBRIUM) capsule 25 mg  25 mg Oral Q6H PRN Larena Sox, MD      . chlordiazePOXIDE (LIBRIUM) capsule 25 mg  25 mg Oral TID Larena Sox, MD       Followed by  . [START ON 06/10/2012] chlordiazePOXIDE (LIBRIUM) capsule 25 mg  25 mg Oral BH-qamhs Larena Sox, MD       Followed by  . [START ON 06/12/2012] chlordiazePOXIDE (LIBRIUM) capsule 25 mg  25 mg Oral Daily Larena Sox, MD      . divalproex (DEPAKOTE ER) 24 hr tablet 250 mg  250 mg Oral Daily Rachael Fee, MD   250 mg at 06/09/12 1205  . divalproex (DEPAKOTE ER) 24 hr tablet 500 mg  500 mg Oral QHS Rachael Fee, MD      .  hydrOXYzine (ATARAX/VISTARIL) tablet 25 mg  25 mg Oral Q6H PRN Larena Sox, MD   25 mg at 06/08/12 0102  . loperamide (IMODIUM) capsule 2-4 mg  2-4 mg Oral PRN Larena Sox, MD      . magnesium hydroxide (MILK OF MAGNESIA) suspension 30 mL  30 mL Oral Daily PRN Larena Sox, MD      . meloxicam (MOBIC) tablet 7.5 mg  7.5 mg Oral BID Rachael Fee, MD   7.5 mg at 06/09/12 1205  . multivitamin with minerals tablet 1 tablet  1 tablet Oral Daily Larena Sox, MD   1 tablet at 06/09/12 0753  . ondansetron (ZOFRAN-ODT) disintegrating tablet 4 mg  4 mg Oral Q6H PRN Larena Sox, MD      . thiamine (VITAMIN B-1) tablet 100 mg  100 mg Oral Daily Larena Sox, MD   100 mg at  06/09/12 0753  . traZODone (DESYREL) tablet 50 mg  50 mg Oral QHS PRN Larena Sox, MD   50 mg at 06/08/12 2143    Lab Results: No results found for this or any previous visit (from the past 48 hour(s)).  Physical Findings: AIMS: Facial and Oral Movements Muscles of Facial Expression: None, normal Lips and Perioral Area: None, normal Jaw: None, normal Tongue: None, normal,Extremity Movements Upper (arms, wrists, hands, fingers): None, normal Lower (legs, knees, ankles, toes): None, normal, Trunk Movements Neck, shoulders, hips: None, normal, Overall Severity Severity of abnormal movements (highest score from questions above): None, normal Incapacitation due to abnormal movements: None, normal, Dental Status Current problems with teeth and/or dentures?: No Does patient usually wear dentures?: No  CIWA:  CIWA-Ar Total: 0 COWS:  COWS Total Score: 2  Treatment Plan Summary: Daily contact with patient to assess and evaluate symptoms and progress in treatment Medication management  Plan: Supportive approach/coping skills/relapse prevention           Continue Librium detox protocol           Mobic 7.5 mg BID           Start Depakote ER 500 mg to 750           Facilitate placement in a rehab facility Medical Decision Making Problem Points:  Review of psycho-social stressors (1) Data Points:  Review of medication regiment & side effects (2) Review of new medications or change in dosage (2)  I certify that inpatient services furnished can reasonably be expected to improve the patient's condition.   Chai Routh A 06/09/2012, 4:32 PM

## 2012-06-09 NOTE — Progress Notes (Signed)
Pt reports he is feeling better this evening.  He has been in the bed most of the day.  He says his withdrawal symptoms are moderate.  He is c/o "ringing in my ears".  He did attend part of the AA group tonight and stayed in the dayroom to eat a snack.  He voices no other complaints.  Reviewed meds with him.  Pt denies SI/HI/AV.  Support and encouragement offered.  Pt unsure about his discharge plans. Safety maintained with q15 minute checks.

## 2012-06-09 NOTE — Progress Notes (Signed)
Recreation Therapy Notes  Date: 04.29.2014 Time: 3:00pm Location: [address] Dayroom      Group Topic/Focus: Communication, Problem Solving, Leisure Edcuation  Participation Level: Active  Participation Quality: Appropriate and Redirectable  Affect: Euthymic  Cognitive: Oriented   Additional Comments: Activity: Human Knot & Leisure Alphabet ; Explanation: Human Knot - Patients are asked to stand in a circle and join hands with the person across from them. As a group patients are then asked to untangle the knot they have created using their hands. Leisure Alphabet - Patients were given a worksheet with each letter of the alphabet, patients were then asked to identify a positive recreation/leisure activity for each letter of the alphabet.   Patient with peers successfully untangled themselves. Patient completed Leisure Information systems manager. Peers assisted patient with letters he was not able to identify himself. Patient participated in group discussion about using problem solving and communication to stay sober. Patient participated in discussion about using leisure and recreation to stay sober and assist with communication and problem solving skills.   Marykay Lex Rylan Kaufmann, LRT/CTRS  Glorie Dowlen L 06/09/2012 5:11 PM

## 2012-06-10 DIAGNOSIS — F39 Unspecified mood [affective] disorder: Secondary | ICD-10-CM

## 2012-06-10 MED ORDER — DIVALPROEX SODIUM ER 250 MG PO TB24
750.0000 mg | ORAL_TABLET | Freq: Every day | ORAL | Status: DC
  Administered 2012-06-10: 750 mg via ORAL
  Filled 2012-06-10 (×3): qty 3

## 2012-06-10 NOTE — [facility] Group Notes (Signed)
[facility] LCSW Aftercare Discharge Planning Group Note   06/10/2012 8:45 AM  Participation Quality:  Appropriate  Mood/Affect:  Appropriate  Depression Rating:  4  Anxiety Rating:  4  Thoughts of Suicide:  No Will you contract for safety?   NA  Current AVH:  No  Plan for Discharge/Comments:  Patient will follow up with [facility] Assessment Parole officer has arranged on Monday May 5th and follow up at [employer]: Parents  Supports: Family  Ryane Canavan, Julious Payer

## 2012-06-10 NOTE — Progress Notes (Signed)
Adult Psychoeducational Group Note  Date:  06/10/2012 Time:  9:29 PM  Group Topic/Focus:  NA group  Participation Level:  Active  Participation Quality:  Appropriate  Affect:  Appropriate  Cognitive:  Alert  Insight: Appropriate  Engagement in Group:  Engaged  Modes of Intervention:  Discussion  Additional Comments:    Flonnie Hailstone 06/10/2012, 9:29 PM

## 2012-06-10 NOTE — Progress Notes (Signed)
[facility] MD Progress Note  06/10/2012 2:21 PM Blake Arnold  MRN:  [id number] Subjective:  Blake Arnold is anticipating being discharged tomorrow. He is reassured by the fact that the Depakote can help with both the seizures and the mood. He states he is committed to abstinence. He wants to make this work for himself as he knows drinking is not getting him anywhere and is going to continue to cause problems for him Diagnosis:  Alcohol Dependence, Mood Disorder NOS  ADL's:  Intact  Sleep: Fair  Appetite:  Fair  Suicidal Ideation:  Plan:  denies Intent:  denies Means:  denies Homicidal Ideation:  Plan:  denies Intent:  denies Means:  denies AEB (as evidenced by):  Psychiatric Specialty Exam: Review of Systems  Constitutional: Negative.   HENT: Negative.   Eyes: Negative.   Respiratory: Negative.   Cardiovascular: Negative.   Gastrointestinal: Negative.   Genitourinary: Negative.   Musculoskeletal: Positive for back pain.  Skin: Negative.   Neurological: Negative.   Endo/Heme/Allergies: Negative.   Psychiatric/Behavioral: Positive for substance abuse. The patient is nervous/anxious.     Blood pressure 132/87, pulse 61, temperature 97.8 F (36.6 C), temperature source Oral, resp. rate 18, height 5\' 8"  (1.727 m), weight 88.451 kg (195 lb).Body mass index is 29.66 kg/(m^2).  General Appearance: Fairly Groomed  Patent attorney::  Fair  Speech:  Clear and Coherent  Volume:  Normal  Mood:  Anxious and worried  Affect:  anxious  Thought Process:  Coherent and Goal Directed  Orientation:  Full (Time, Place, and Person)  Thought Content:  worries, concerns  Suicidal Thoughts:  No  Homicidal Thoughts:  No  Memory:  Immediate;   Fair Recent;   Fair Remote;   Fair  Judgement:  Fair  Insight:  Present  Psychomotor Activity:  Restlessness  Concentration:  Fair  Recall:  Fair  Akathisia:  No  Handed:  Right  AIMS (if indicated):     Assets:  Desire for Improvement  Sleep:  Number of  Hours: 6.5   Current Medications: Current Facility-Administered Medications  Medication Dose Route Frequency Provider Last Rate Last Dose  . acetaminophen (TYLENOL) tablet 650 mg  650 mg Oral Q6H PRN Larena Sox, MD   650 mg at 06/08/12 0102  . alum & mag hydroxide-simeth (MAALOX/MYLANTA) 200-200-20 MG/5ML suspension 30 mL  30 mL Oral Q4H PRN Larena Sox, MD   30 mL at 06/10/12 1201  . aspirin chewable tablet 81 mg  81 mg Oral Daily Larena Sox, MD   81 mg at 06/10/12 0755  . chlordiazePOXIDE (LIBRIUM) capsule 25 mg  25 mg Oral Q6H PRN Larena Sox, MD      . chlordiazePOXIDE (LIBRIUM) capsule 25 mg  25 mg Oral BH-qamhs Larena Sox, MD       Followed by  . [START ON 06/12/2012] chlordiazePOXIDE (LIBRIUM) capsule 25 mg  25 mg Oral Daily Larena Sox, MD      . divalproex (DEPAKOTE ER) 24 hr tablet 250 mg  250 mg Oral Daily Rachael Fee, MD   250 mg at 06/10/12 0756  . divalproex (DEPAKOTE ER) 24 hr tablet 750 mg  750 mg Oral QHS Rachael Fee, MD      . hydrOXYzine (ATARAX/VISTARIL) tablet 25 mg  25 mg Oral Q6H PRN Larena Sox, MD   25 mg at 06/08/12 0102  . loperamide (IMODIUM) capsule 2-4 mg  2-4 mg Oral PRN Larena Sox, MD      .  magnesium hydroxide (MILK OF MAGNESIA) suspension 30 mL  30 mL Oral Daily PRN Larena Sox, MD      . meloxicam (MOBIC) tablet 7.5 mg  7.5 mg Oral BID Rachael Fee, MD   7.5 mg at 06/10/12 0755  . multivitamin with minerals tablet 1 tablet  1 tablet Oral Daily Larena Sox, MD   1 tablet at 06/10/12 0756  . ondansetron (ZOFRAN-ODT) disintegrating tablet 4 mg  4 mg Oral Q6H PRN Larena Sox, MD      . thiamine (VITAMIN B-1) tablet 100 mg  100 mg Oral Daily Larena Sox, MD   100 mg at 06/10/12 0755  . traZODone (DESYREL) tablet 50 mg  50 mg Oral QHS PRN Larena Sox, MD   50 mg at 06/09/12 2152    Lab Results: No results found for this or any previous visit (from the past 48 hour(s)).  Physical  Findings: AIMS: Facial and Oral Movements Muscles of Facial Expression: None, normal Lips and Perioral Area: None, normal Jaw: None, normal Tongue: None, normal,Extremity Movements Upper (arms, wrists, hands, fingers): None, normal Lower (legs, knees, ankles, toes): None, normal, Trunk Movements Neck, shoulders, hips: None, normal, Overall Severity Severity of abnormal movements (highest score from questions above): None, normal Incapacitation due to abnormal movements: None, normal, Dental Status Current problems with teeth and/or dentures?: No Does patient usually wear dentures?: No  CIWA:  CIWA-Ar Total: 2 COWS:  COWS Total Score: 2  Treatment Plan Summary: Daily contact with patient to assess and evaluate symptoms and progress in treatment Medication management  Plan: Supportive approach/coping skills/relapse prevention           Increase Depakote to 1,000 mg HS       Medical Decision Making Problem Points:  Review of last therapy session (1) and Review of psycho-social stressors (1) Data Points:  Review of medication regiment & side effects (2)  I certify that inpatient services furnished can reasonably be expected to improve the patient's condition.   Adler Chartrand A 06/10/2012, 2:21 PM

## 2012-06-10 NOTE — Progress Notes (Signed)
D: Patient denies SI/HI and A/V hallucinations; patient reports sleep is well and restful with the medication; reports appetite is good ; reports energy level is normal ; reports ability to pay attention is good; rates depression as 4/10; rates hopelessness 4/10; patient complains of sone withdrawal symptoms of chills, agitation, and cravings  A: Monitored q 15 minutes; patient encouraged to attend groups; patient educated about medications; patient given medications per physician orders; patient encouraged to express feelings and/or concerns  R: Patient reports that his withdrawal symptoms are improving; patient is calm and cooperative;   patient's interaction with staff and peers is appropriate and observed laughing and joking ; ; patient is taking medications as prescribed and tolerating medications; patient is attending all groups and engaging in the activities; patient reports a relief in his withdrawal symptoms even though he still has some withdrawal symptoms

## 2012-06-10 NOTE — [facility] Group Notes (Signed)
[facility] LCSW Group Therapy  06/10/2012 1:15 PM  Type of Therapy:  Group Therapy 1:15  Participation Level:  Minimal  Participation Quality:  Attentive and sharing  Affect:  Flat  Cognitive:  Alert and Oriented  Insight:  Developing/Improving  Engagement in Therapy:  Developing/Improving  Modes of Intervention:  Clarification, Discussion, Exploration, Socialization and Support  Summary of Progress/Problems: The focus of this group session was to process how we deal with difficult emotions and share with others the patterns that play out when we are reacting to the emotion verses the situation.  Jahmeer shared that one of the most difficult emotions he deals with is the "shame and guilt associated with alcohol use and believe it or not that often makes me use more." Others in group were able to relate to patient and offer support.    Clide Dales

## 2012-06-10 NOTE — Tx Team (Signed)
Interdisciplinary Treatment Plan Update (Adult)  Date: 06/10/2012  Time Reviewed: 9:50 AM   Progress in Treatment: Attending groups: Yes Participating in groups: Yes Taking medication as prescribed:  Yes Tolerating medication:  Yes Family/Significant othe contact made: No Patient understands diagnosis: Yes Discussing patient identified problems/goals with staff: Yes Medical problems stabilized or resolved:  Yes Denies suicidal/homicidal ideation: Yes Patient has not harmed self or Others: Yes  New problem(s) identified: None Identified  Discharge Plan or Barriers:  Patient's Probation officer has appointment for patient to go to Assessment at [facility] this week.  Patient will also follow up with [facility] for medication management.   Additional comments: N/A  Reason for Continuation of Hospitalization: Medication stabilization   Estimated length of stay: 1 day  For review of initial/current patient goals, please see plan of care.  Attendees: Patient:     Family:     Physician:  Geoffery Lyons 06/10/2012 9:50 AM   Nursing:   Roswell Miners, RN 06/10/2012 9:50 AM   Clinical Social Worker Ronda Fairly 06/10/2012 9:50 AM   Other:  Dorinda Hill, [place] PA Student 06/10/2012 9:50 AM   Other:  Jacolyn Reedy Psych Student 06/10/2012 9:50 AM   Other:  Serena Colonel, PA 06/10/2012 9:50 AM   Other:  Carney Living,  06/10/2012 9:50 AM    Scribe for Treatment Team:   Carney Bern, LCSWA  06/10/2012 9:50 AM

## 2012-06-10 NOTE — Progress Notes (Signed)
Pt reports he has had a good day.  He says his withdrawal symptoms are minimal.  He has showered and groomed and appears to feel better than yesterday evening.  He voices no needs or complaints at this time.  He denies SI/HI/AV.  He has been going to groups and feels they are helpful.  He says he is discharging tomorrow and feels he is ready to go.  Pt makes his needs known to staff.  Support and encouragement offered.  Safety maintained with q15 minute checks.

## 2012-06-11 MED ORDER — MELOXICAM 7.5 MG PO TABS: 7.5000 mg | ORAL_TABLET | Freq: Two times a day (BID) | ORAL | Status: DC

## 2012-06-11 MED ORDER — ASPIRIN EC 81 MG PO TBEC: 81.0000 mg | DELAYED_RELEASE_TABLET | Freq: Every day | ORAL | Status: DC

## 2012-06-11 MED ORDER — DIVALPROEX SODIUM ER 250 MG PO TB24: ORAL_TABLET | ORAL | Status: DC

## 2012-06-11 MED ORDER — ASPIRIN EC 81 MG PO TBEC
81.0000 mg | DELAYED_RELEASE_TABLET | Freq: Every day | ORAL | Status: DC
  Filled 2012-06-11 (×2): qty 14

## 2012-06-11 MED ORDER — TRAZODONE HCL 50 MG PO TABS: 50.0000 mg | ORAL_TABLET | Freq: Every evening | ORAL | Status: DC | PRN

## 2012-06-11 NOTE — Progress Notes (Signed)
Discharge Note:  Patient discharged home with his family member.  Denied SI and HI.  Denied A/V hallucinations.  Denied pain.  Suicide prevention information given to patient and discussed with patient, who stated he understood and has no questions.  Patient received all his belongings, clothing, prescriptions, medications.  Patient stated he appreciated all assistance received from [facility].

## 2012-06-11 NOTE — [facility] Suicide Risk Assessment (Signed)
Suicide Risk Assessment  Discharge Assessment     Demographic Factors:  Male, Caucasian and Unemployed  Mental Status Per Nursing Assessment::   On Admission:  NA  Current Mental Status by Physician: In full contact with reality. There are no suicidal ideas, plans or intent. His mood is euthymic, his affect is appropriate. He has an appointment Monday to be assessed at [facility] for admission to rehab. His probation officer is spearheading the plan. He is holding him accountable   Loss Factors: NA  Historical Factors: NA  Risk Reduction Factors:   Positive social support  Continued Clinical Symptoms:  Alcohol/Substance Abuse/Dependencies  Cognitive Features That Contribute To Risk:  Closed-mindedness Polarized thinking Thought constriction (tunnel vision)    Suicide Risk:  Minimal: No identifiable suicidal ideation.  Patients presenting with no risk factors but with morbid ruminations; may be classified as minimal risk based on the severity of the depressive symptoms  Discharge Diagnoses:   AXIS I:  Alcohol Dependence, Depressive Disorder NOS AXIS II:  Deferred AXIS III:   Past Medical History  Diagnosis Date  . Hepatitis C   . Chronic back pain   . A-fib   . Depression   . Anxiety   . Hepatitis C     history of  . Chronic back pain   . Acid reflux   . History of urinary frequency   . Peripheral neuropathy     hands and feet  . Hypertension   . Polysubstance abuse 01/25/2011  . Alcohol abuse   . Seizures    AXIS IV:  other psychosocial or environmental problems AXIS V:  61-70 mild symptoms  Plan Of Care/Follow-up recommendations:  Activity:  as tolerated Diet:  regular Follow up [facility] Is patient on multiple antipsychotic therapies at discharge:  No   Has Patient had three or more failed trials of antipsychotic monotherapy by history:  No  Recommended Plan for Multiple Antipsychotic Therapies: N/A   Blake Arnold A 06/11/2012, 11:49 AM

## 2012-06-11 NOTE — Progress Notes (Signed)
D:  Patient's self inventory sheet, patient sleeps well, has good appetite, normal energy level, good attention span.  Rated depression #1, hopelessness #2.  Has experienced cravings and agitation.  Denied SI.  Physical problems in past 24 hours are lightheaded, pain, dizzy, blurred vision. A:  Medications administered per MD orders.  Support and encouragement given throughout day.   R:  Following treatment plan.  Denied SI & HI.  Denied A/V hallucinations.  Denied pain.  Will continue every 15 minute safety checks per MD order for safety. Safety maintained.

## 2012-06-11 NOTE — Progress Notes (Addendum)
[facility] LCSW Aftercare Discharge Planning Group Note   06/11/2012  8:45 AM  Participation Quality:  Appropriate  Mood/Affect:  Appropriate  Depression Rating:  1  Anxiety Rating:  3  Thoughts of Suicide:  No Will you contract for safety?   NA  Current AVH:  No  Plan for Discharge/Comments:  Patient to follow up with [facility] program (SA program required by terms of probation) and attend assessment at [facility] arranged by his probation officer. Patient will be staying at father's home where there is no alcohol present vs returning to mother's home.  Transportation Means:  Stepfather  Supports: Family, Nurse, adult.   Blake Arnold

## 2012-06-11 NOTE — Discharge Summary (Signed)
Physician Discharge Summary Note  Patient:  Blake Arnold is an 42 y.o., male MRN:  [id number] DOB:  Feb 06, 1971 Patient phone:  [phone] (home)  Patient address:   [address] [place] [address],   Date of Admission:  06/08/2012 Date of Discharge: 06/11/12  Reason for Admission:  Alcohol abuse  Discharge Diagnoses: Active Problems:   * No active hospital problems. *  Review of Systems  Constitutional: Negative.   HENT: Negative.   Eyes: Negative.   Respiratory: Negative.   Cardiovascular: Negative.   Gastrointestinal: Negative.   Genitourinary: Negative.   Musculoskeletal: Negative.   Skin: Negative.   Neurological: Positive for seizures (Hx of).  Psychiatric/Behavioral: Positive for substance abuse. Negative for depression, suicidal ideas, hallucinations and memory loss. The patient has insomnia (Stabilized with medication prior to discharge). The patient is not nervous/anxious.    Axis Diagnosis:   AXIS I:  Alcohol dependence, major depression AXIS II:  Deferred AXIS III:   Past Medical History  Diagnosis Date  . Hepatitis C   . Chronic back pain   . A-fib   . Depression   . Anxiety   . Hepatitis C     history of  . Chronic back pain   . Acid reflux   . History of urinary frequency   . Peripheral neuropathy     hands and feet  . Hypertension   . Polysubstance abuse 01/25/2011  . Alcohol abuse   . Seizures    AXIS IV:  Substance abuse issues, legal problems. AXIS V:  63  Level of Care:  OP  Hospital Course: Let this unit February the 25 th. Staid abstinent for two or three weeks. We to [facility], the [facility] program.Talked to his partner she told him she anted to be back together. She was drinking and using drugs. States that this interaction and a friend asking him to drink "just one airplane bottle.." build up to 7-8 40 ounces a day. States he was hiding it from his family. Eventually they found out. He wen to the [facility] program blew positive. He might  be violated. He states the probation officer is willing to work with him. Last drink Friday night. States he did a line of cocaine. States he felt he was ready to have a seizure when he was in the ED. Has had withdrawal seizures before, Went off the Dilantin. He was "shaky," thought it was the Prozac or the Trazodone, went off both.   Upon admission into this hospital, and after admission assessment/evaluation, it was determined that patient will need detoxification treatment to stabilize his system of alcohol intoxication and to combat the withdrawal symptoms as well. And his discharge plans included a referral to an outpatient psychiatric clinic for post hospitalization follow-up care, routine psychiatric treatment and medication management. [name] was then started on Librium protocol for his alcohol detoxification. He was also enrolled in group counseling sessions and activities to learn coping skills that should help him after discharge to cope better, manage his substance abuse problems to maintain a much longer sobriety. He also was enrolled and attended AA/NA meetings being offered and held on this unit. He has some previously existing and or identifiable medical conditions that required treatment and or monitoring. He received medication management for all those health issues as well. He was monitored closely for any potential problems that may arise as a result of and or during detoxification treatment. Patient tolerated his treatment regimen and detoxification treatment without any significant  adverse effects and or reactions reported.  Patient attended treatment team meeting this am and met with the team. His symptoms, substance abuse issues, response to treatment and discharge plans discussed. Patient endorsed that he is doing well and stable for discharge to pursue the next phase of his substance abuse treatment. It agreed upon that he will follow-up care at the [facility] here  in [place], [place] within the next 5 business days between the hours of 08:30 am and 03:00 pm. The address, date, time and contact information for [facility] provided for patient in writing. He was also instructed that this is a walk-in appointment.   Besides detoxification treatment protocol, [name] was also prescribed and received Depakote ER 250 mg for mood stabilization and Trazodone 50 mg Q bedtime for sleep. He was encouraged to join/attend AA/NA meetings being offered and held within his community to help with his sobriety.  Patient is currently being discharged to his home with family. Upon discharge, patient adamantly denies suicidal, homicidal ideations, auditory, visual hallucinations, delusional thinking and or withdrawal symptoms. Patient left [facility] with all personal belongings in no apparent distress. He received 2 weeks worth samples of his discharge medications. Transportation per family.   Consults:  None  Significant Diagnostic Studies:  labs: CBC with diff, CMP, UDS, Toxicology tests, U/A  Discharge Vitals:   Blood pressure 159/98, pulse 63, temperature 97.7 F (36.5 C), temperature source Oral, resp. rate 16, height 5\' 8"  (1.727 m), weight 88.451 kg (195 lb). Body mass index is 29.66 kg/(m^2). Lab Results:   No results found for this or any previous visit (from the past 72 hour(s)).  Physical Findings: AIMS: Facial and Oral Movements Muscles of Facial Expression: None, normal Lips and Perioral Area: None, normal Jaw: None, normal Tongue: None, normal,Extremity Movements Upper (arms, wrists, hands, fingers): None, normal Lower (legs, knees, ankles, toes): None, normal, Trunk Movements Neck, shoulders, hips: None, normal, Overall Severity Severity of abnormal movements (highest score from questions above): None, normal Incapacitation due to abnormal movements: None, normal, Dental Status Current problems with teeth and/or dentures?: No Does patient usually wear  dentures?: No  CIWA:  CIWA-Ar Total: 0 COWS:  COWS Total Score: 2  Psychiatric Specialty Exam: See Psychiatric Specialty Exam and Suicide Risk Assessment completed by Attending Physician prior to discharge.  Discharge destination:  Home  Is patient on multiple antipsychotic therapies at discharge:  No   Has Patient had three or more failed trials of antipsychotic monotherapy by history:  No  Recommended Plan for Multiple Antipsychotic Therapies: NA     Medication List    STOP taking these medications       FLUoxetine 20 MG capsule  Commonly known as:  PROZAC      TAKE these medications     Indication   aspirin EC 81 MG tablet  Take 1 tablet (81 mg total) by mouth daily. Blood thinner for heart health   Indication:  Blood thinner     divalproex 250 MG 24 hr tablet  Commonly known as:  DEPAKOTE ER  Take 250 mg daily and 750 mg at bedtime for mood stabilization   Indication:  For mood stabilization     meloxicam 7.5 MG tablet  Commonly known as:  MOBIC  Take 1 tablet (7.5 mg total) by mouth 2 (two) times daily. For arthritis pain   Indication:  Joint Damage causing Pain and Loss of Function     traZODone 50 MG tablet  Commonly known as:  DESYREL  Take 1 tablet (50 mg total) by mouth at bedtime as needed for sleep (May repeat x1). For sleep   Indication:  Trouble Sleeping       Follow-up Information   Follow up with [facility]. (Go to walk in clinic at [facility] within the next five business days; the walkin clinic is open from 8:30 AM until 3 PM; the earlier you arrive the earlier you are likely to be seen. )    Contact information:   [address]  [place], [place] [address] [facility] [phone] FAX [phone]     Follow-up recommendations:  Activity:  As tolerated Diet: As recommended by your primary care doctor. Keep all scheduled follow-up appointments as recommended. Continue to work the relapse prevention plan Comments:  Take all your medications as prescribed by  your mental healthcare provider. Report any adverse effects and or reactions from your medicines to your outpatient provider promptly. Patient is instructed and cautioned to not engage in alcohol and or illegal drug use while on prescription medicines. In the event of worsening symptoms, patient is instructed to call the crisis hotline, 911 and or go to the nearest ED for appropriate evaluation and treatment of symptoms. Follow-up with your primary care provider for your other medical issues, concerns and or health care needs.   Total Discharge Time:  Greater than 30 minutes.  SignedArmandina Stammer I 06/11/2012, 9:27 AM

## 2012-06-11 NOTE — Progress Notes (Signed)
[facility] Adult Case Management Discharge Plan :  Will you be returning to the same living situation after discharge: No.Patient discharging to alcohol free environment at father's home.  At discharge, do you have transportation home?:Yes,  patient's step father Do you have the ability to pay for your medications:Yes,  through [facility]  Release of information consent forms completed and in the chart;  Patient's signature needed at discharge.  Patient to Follow up at: Follow-up Information   Follow up with [facility]. (Go to walk in clinic at [facility] within the next five business days; the walkin clinic is open from 8:30 AM until 3 PM; the earlier you arrive the earlier you are likely to be seen. )    Contact information:   [address]  [place], [place] [address] [facility] [phone] Valinda Hoar [phone]      Patient denies SI/HI:   Yes,  denies both    Safety Planning and Suicide Prevention discussed:  No.Suicidal ideation was not present at patient's admit or during stay with [place].   Clide Dales 06/11/2012, 6:14 PM

## 2012-06-16 NOTE — Progress Notes (Signed)
Patient Discharge Instructions:  After Visit Summary (AVS):   Faxed to:  06/16/12 Discharge Summary Note:   Faxed to:  06/16/12 Psychiatric Admission Assessment Note:   Faxed to:  06/16/12 Suicide Risk Assessment - Discharge Assessment:   Faxed to:  06/16/12 Faxed/Sent to the Next Level Care provider:  06/16/12 Faxed to [facility] @ [phone]  Jerelene Redden, 06/16/2012, 4:16 PM

## 2012-06-30 ENCOUNTER — Emergency Department (HOSPITAL_COMMUNITY)
Admission: EM | Admit: 2012-06-30 | Discharge: 2012-07-02 | Disposition: A | Discharge status: Home / Self Care | Payer: Self-pay | Attending: Emergency Medicine | Admitting: Emergency Medicine

## 2012-06-30 ENCOUNTER — Encounter (HOSPITAL_COMMUNITY): Payer: Self-pay | Admitting: Emergency Medicine

## 2012-06-30 DIAGNOSIS — Z8669 Personal history of other diseases of the nervous system and sense organs: Secondary | ICD-10-CM | POA: Insufficient documentation

## 2012-06-30 DIAGNOSIS — F329 Major depressive disorder, single episode, unspecified: Secondary | ICD-10-CM

## 2012-06-30 DIAGNOSIS — I4891 Unspecified atrial fibrillation: Secondary | ICD-10-CM | POA: Insufficient documentation

## 2012-06-30 DIAGNOSIS — Z8659 Personal history of other mental and behavioral disorders: Secondary | ICD-10-CM | POA: Insufficient documentation

## 2012-06-30 DIAGNOSIS — R259 Unspecified abnormal involuntary movements: Secondary | ICD-10-CM | POA: Insufficient documentation

## 2012-06-30 DIAGNOSIS — I10 Essential (primary) hypertension: Secondary | ICD-10-CM | POA: Insufficient documentation

## 2012-06-30 DIAGNOSIS — R110 Nausea: Secondary | ICD-10-CM | POA: Insufficient documentation

## 2012-06-30 DIAGNOSIS — G40909 Epilepsy, unspecified, not intractable, without status epilepticus: Secondary | ICD-10-CM | POA: Insufficient documentation

## 2012-06-30 DIAGNOSIS — Z7982 Long term (current) use of aspirin: Secondary | ICD-10-CM | POA: Insufficient documentation

## 2012-06-30 DIAGNOSIS — Z8719 Personal history of other diseases of the digestive system: Secondary | ICD-10-CM | POA: Insufficient documentation

## 2012-06-30 DIAGNOSIS — Z87448 Personal history of other diseases of urinary system: Secondary | ICD-10-CM | POA: Insufficient documentation

## 2012-06-30 DIAGNOSIS — G8929 Other chronic pain: Secondary | ICD-10-CM | POA: Insufficient documentation

## 2012-06-30 DIAGNOSIS — Z8619 Personal history of other infectious and parasitic diseases: Secondary | ICD-10-CM | POA: Insufficient documentation

## 2012-06-30 DIAGNOSIS — F17200 Nicotine dependence, unspecified, uncomplicated: Secondary | ICD-10-CM | POA: Insufficient documentation

## 2012-06-30 DIAGNOSIS — F1010 Alcohol abuse, uncomplicated: Secondary | ICD-10-CM

## 2012-06-30 DIAGNOSIS — F3289 Other specified depressive episodes: Secondary | ICD-10-CM | POA: Insufficient documentation

## 2012-06-30 DIAGNOSIS — F1020 Alcohol dependence, uncomplicated: Secondary | ICD-10-CM | POA: Insufficient documentation

## 2012-06-30 DIAGNOSIS — M549 Dorsalgia, unspecified: Secondary | ICD-10-CM | POA: Insufficient documentation

## 2012-06-30 LAB — COMPREHENSIVE METABOLIC PANEL
ALT: 46 U/L (ref 0–53)
AST: 28 U/L (ref 0–37)
Albumin: 4.2 g/dL (ref 3.5–5.2)
CO2: 23 mEq/L (ref 19–32)
Calcium: 9.7 mg/dL (ref 8.4–10.5)
Chloride: 100 mEq/L (ref 96–112)
Creatinine, Ser: 0.93 mg/dL (ref 0.50–1.35)
Sodium: 137 mEq/L (ref 135–145)
Total Bilirubin: 1 mg/dL (ref 0.3–1.2)

## 2012-06-30 LAB — CBC
MCV: 92.6 fL (ref 78.0–100.0)
Platelets: 239 10*3/uL (ref 150–400)
RBC: 5.44 MIL/uL (ref 4.22–5.81)
RDW: 13.2 % (ref 11.5–15.5)
WBC: 8.7 10*3/uL (ref 4.0–10.5)

## 2012-06-30 LAB — RAPID URINE DRUG SCREEN, HOSP PERFORMED
Amphetamines: NOT DETECTED
Benzodiazepines: POSITIVE — AB
Opiates: NOT DETECTED
Tetrahydrocannabinol: NOT DETECTED

## 2012-06-30 LAB — SALICYLATE LEVEL: Salicylate Lvl: <2 mg/dL — ABNORMAL LOW (ref 2.8–20.0)

## 2012-06-30 MED ORDER — GABAPENTIN 600 MG PO TABS: 300.0000 mg | ORAL_TABLET | Freq: Two times a day (BID) | ORAL | Status: DC

## 2012-06-30 MED ORDER — ADULT MULTIVITAMIN W/MINERALS CH
1.0000 | ORAL_TABLET | Freq: Every day | ORAL | Status: DC
  Administered 2012-06-30 – 2012-07-01 (×2): 1 via ORAL
  Filled 2012-06-30 (×2): qty 1

## 2012-06-30 MED ORDER — ONDANSETRON HCL 4 MG PO TABS
4.0000 mg | ORAL_TABLET | Freq: Three times a day (TID) | ORAL | Status: DC | PRN
  Administered 2012-06-30: 4 mg via ORAL
  Filled 2012-06-30: qty 1

## 2012-06-30 MED ORDER — NICOTINE 21 MG/24HR TD PT24
21.0000 mg | MEDICATED_PATCH | Freq: Every day | TRANSDERMAL | Status: DC
  Filled 2012-06-30 (×2): qty 1

## 2012-06-30 MED ORDER — LORAZEPAM 2 MG/ML IJ SOLN: 1.0000 mg | Freq: Four times a day (QID) | INTRAMUSCULAR | Status: DC | PRN

## 2012-06-30 MED ORDER — IBUPROFEN 400 MG PO TABS
600.0000 mg | ORAL_TABLET | Freq: Three times a day (TID) | ORAL | Status: DC | PRN
  Administered 2012-07-01 – 2012-07-02 (×2): 600 mg via ORAL
  Filled 2012-06-30 (×2): qty 1

## 2012-06-30 MED ORDER — ACETAMINOPHEN 325 MG PO TABS
650.0000 mg | ORAL_TABLET | ORAL | Status: DC | PRN
  Administered 2012-06-30: 650 mg via ORAL
  Filled 2012-06-30: qty 2

## 2012-06-30 MED ORDER — THIAMINE HCL 100 MG/ML IJ SOLN: 100.0000 mg | Freq: Every day | INTRAMUSCULAR | Status: DC

## 2012-06-30 MED ORDER — TRAZODONE HCL 50 MG PO TABS: 50.0000 mg | ORAL_TABLET | Freq: Every evening | ORAL | Status: DC | PRN

## 2012-06-30 MED ORDER — ALUM & MAG HYDROXIDE-SIMETH 200-200-20 MG/5ML PO SUSP: 30.0000 mL | ORAL | Status: DC | PRN

## 2012-06-30 MED ORDER — GABAPENTIN 300 MG PO CAPS
300.0000 mg | ORAL_CAPSULE | Freq: Two times a day (BID) | ORAL | Status: DC
  Administered 2012-06-30 – 2012-07-01 (×4): 300 mg via ORAL
  Filled 2012-06-30 (×4): qty 1

## 2012-06-30 MED ORDER — LORAZEPAM 1 MG PO TABS: 1.0000 mg | ORAL_TABLET | Freq: Four times a day (QID) | ORAL | Status: DC | PRN

## 2012-06-30 MED ORDER — DIVALPROEX SODIUM ER 250 MG PO TB24
250.0000 mg | ORAL_TABLET | Freq: Every day | ORAL | Status: DC
  Administered 2012-06-30 – 2012-07-01 (×2): 250 mg via ORAL
  Filled 2012-06-30 (×3): qty 1

## 2012-06-30 MED ORDER — VITAMIN B-1 100 MG PO TABS
100.0000 mg | ORAL_TABLET | Freq: Every day | ORAL | Status: DC
Start: 2012-06-30 — End: 2012-07-02
  Administered 2012-06-30 – 2012-07-01 (×2): 100 mg via ORAL
  Filled 2012-06-30 (×2): qty 1

## 2012-06-30 MED ORDER — ZOLPIDEM TARTRATE 5 MG PO TABS
10.0000 mg | ORAL_TABLET | Freq: Every evening | ORAL | Status: DC | PRN
  Administered 2012-06-30 – 2012-07-01 (×2): 10 mg via ORAL
  Filled 2012-06-30: qty 2
  Filled 2012-06-30 (×2): qty 1

## 2012-06-30 MED ORDER — FOLIC ACID 1 MG PO TABS
1.0000 mg | ORAL_TABLET | Freq: Every day | ORAL | Status: DC
  Administered 2012-06-30 – 2012-07-01 (×2): 1 mg via ORAL
  Filled 2012-06-30 (×2): qty 1

## 2012-06-30 MED ORDER — ASPIRIN EC 81 MG PO TBEC
81.0000 mg | DELAYED_RELEASE_TABLET | Freq: Every day | ORAL | Status: DC
  Administered 2012-06-30 – 2012-07-01 (×2): 81 mg via ORAL
  Filled 2012-06-30 (×3): qty 1

## 2012-06-30 NOTE — ED Notes (Signed)
Pt wanded and transferred to Pod C. Report given Jerry Caras, RN

## 2012-06-30 NOTE — BH Assessment (Addendum)
[facility] Assessment Progress Note      Update @ 1844:  [employer] and per [place], beds available @ [phone].  Referral faxed for review.  Called [facility] [place] and left message @ 1650.  Called [place] and no [place] beds per [place] @ [phone].  Called [place] and beds available per [facility] @ 1650.  Referral faxed for review.  Called [facility] and no beds per [place] @ 1652.  [facility] and they don't do detox per [facility] @ 1654.  Called [place] and Rudell Cobb told Clinical research associate to fax referral @ [phone].  However, received call from [facility] stating [facility] called there and that per Thayer Ohm, pt was declined by Dr. Mardee Postin.  Then, received call from [facility] @ [phone] stating they do not do detox.    Update:  There are no detox beds at [facility] per Thurman Coyer, [facility].  Called [facility] and no beds per [facility] @ 1520.  Called [facility] and beds available per Liborio Nixon at 1521.  Prescreen completed and referral faxed for review.  Received call from [place] at [facility] at 1640 stating pt declined there due to having seizure disorder.  This clinician will continue bed finding for pt.

## 2012-06-30 NOTE — BH Assessment (Signed)
Assessment Note   Blake Arnold is an 42 y.o. male  that was assessed this day.  Pt presents to [facility] requesting detox from alcohol.  Pt reports he drinks 3-5 40 oz beers per day and last use was 06/29/12 at 10 AM.  Pt reported he had 2 40 oz beers after going to [facility] for his scheduled residential treatment appt and having alcohol on his breath.  Pt was turned away and went home and drank.  Pt stated he only had one sip of beer before presenting to [facility] yesterday.  Pt stated he has been drinking for one week after being sober for 2 weeks following his recent discharge from [facility].  Pt denies drug use.  Pt admits to depressive sx.  Pt reports current withdrawal sx are headache, anxiety, and tremor.  Pt has a hx of blackouts and seizures.  Pt reports last seizure was 3 months ago.  Pt is motivated for detox and wants treatment.  Pt stated he is also in a state mandated SA program that he attends 3 x/week.  Pt stated he took the last of his prescribed psychotropic medications yesterday, but did have a Trazadone last night for sleep.  Pt denies SI/HI or psychosis.  Pt appears depressed and anxious.  Pt reports current stressors are living with his mother, conflict with his ex-wife, and being unable to find a job due to being incarcerated and currently being on parole.  Pt is oriented X 4, has logical and coherent thought processes and normal speech.  Pt has not had any MH or SA treatment since recent discharge from [facility].  Completed assessment and admission request and faxed to [facility] to run for possible admission.  Updated ED staff.   Axis I: 296.33 Major Depressive Disorder, Recurrent, Severe Without Psychotic Features, 303.90 Alcohol Dependence Axis II: Deferred Axis III:  Past Medical History  Diagnosis Date  . Hepatitis C   . Chronic back pain   . A-fib   . Depression   . Anxiety   . Hepatitis C     history of  . Chronic back pain   . Acid reflux   . History of urinary frequency   . Peripheral  neuropathy     hands and feet  . Hypertension   . Polysubstance abuse 01/25/2011  . Alcohol abuse   . Seizures    Axis IV: economic problems, housing problems, occupational problems, other psychosocial or environmental problems, problems related to legal system/crime, problems related to social environment, problems with access to health care services and problems with primary support group Axis V: 21-30 behavior considerably influenced by delusions or hallucinations OR serious impairment in judgment, communication OR inability to function in almost all areas  Past Medical History:  Past Medical History  Diagnosis Date  . Hepatitis C   . Chronic back pain   . A-fib   . Depression   . Anxiety   . Hepatitis C     history of  . Chronic back pain   . Acid reflux   . History of urinary frequency   . Peripheral neuropathy     hands and feet  . Hypertension   . Polysubstance abuse 01/25/2011  . Alcohol abuse   . Seizures     Past Surgical History  Procedure Laterality Date  . Cardioversion  03/07/2006    Family History: History reviewed. No pertinent family history.  Social History:  reports that he has been smoking Cigarettes.  He has a  25 pack-year smoking history. He has never used smokeless tobacco. He reports that  drinks alcohol. He reports that he uses illicit drugs ("Crack" cocaine and Cocaine).  Additional Social History:  Alcohol / Drug Use Pain Medications: see MAR Prescriptions: see MAR Over the Counter: see MAR History of alcohol / drug use?: Yes Longest period of sobriety (when/how long): 1 month: Feb 2014 Negative Consequences of Use: Financial;Legal;Personal relationships Withdrawal Symptoms: Blackouts;Tremors;Other (Comment) (headache) Date of most recent seizure: 3 months ago Substance #1 Name of Substance 1: alcohol 1 - Age of First Use: 14 1 - Amount (size/oz): 3-5 40 oz beers 1 - Frequency: daily 1 - Duration: 1 week 1 - Last Use / Amount: 06/29/12 -  10 AM - 2 40 oz beers Substance #2 Name of Substance 2: cocaine 2 - Age of First Use: 15 2 - Amount (size/oz): Pt denies regular use 2 - Frequency: pt denies use 2 - Duration: ongoing for years 2 - Last Use / Amount: last month - 1 line per pt   CIWA: CIWA-Ar BP: 128/85 mmHg Pulse Rate: 79 Nausea and Vomiting: no nausea and no vomiting Tactile Disturbances: none Tremor: not visible, but can be felt fingertip to fingertip Auditory Disturbances: not present Paroxysmal Sweats: no sweat visible Visual Disturbances: not present Anxiety: two Headache, Fullness in Head: moderately severe Agitation: normal activity Orientation and Clouding of Sensorium: oriented and can do serial additions CIWA-Ar Total: 7 COWS:    Allergies: No Known Allergies  Home Medications:  (Not in a hospital admission)  OB/GYN Status:  No LMP for male patient.  General Assessment Data Location of Assessment: [facility] ED Living Arrangements: Parent Can pt return to current living arrangement?: Yes Admission Status: Voluntary Is patient capable of signing voluntary admission?: Yes Transfer from: Acute Hospital Referral Source: Self/Family/Friend  Education Status Is patient currently in school?: No  Risk to self Suicidal Ideation: No Suicidal Intent: No Is patient at risk for suicide?: No Suicidal Plan?: No Access to Means: No What has been your use of drugs/alcohol within the last 12 months?: Pt reports daily alcohol use Previous Attempts/Gestures: No How many times?: 0 Other Self Harm Risks: pt denies Triggers for Past Attempts: None known Intentional Self Injurious Behavior: Damaging Comment - Self Injurious Behavior: Ongoing SA Family Suicide History: No Recent stressful life event(s): Conflict (Comment);Other (Comment);Financial Problems;Recent negative physical changes (conflict w/ ex-wife, can't find work due to legal hx) Persecutory voices/beliefs?: No Depression: Yes Depression Symptoms:  Despondent;Insomnia;Fatigue;Guilt;Loss of interest in usual pleasures;Feeling worthless/self pity Substance abuse history and/or treatment for substance abuse?: Yes Suicide prevention information given to non-admitted patients: Not applicable  Risk to Others Homicidal Ideation: No Thoughts of Harm to Others: No Current Homicidal Intent: No Current Homicidal Plan: No Access to Homicidal Means: No Identified Victim: pt denies History of harm to others?: No Assessment of Violence: None Noted Violent Behavior Description: na - pt calm, cooperative Does patient have access to weapons?: No Criminal Charges Pending?: No (pt is on parole) Does patient have a court date: No  Psychosis Hallucinations: None noted Delusions: None noted  Mental Status Report Appear/Hygiene: Other (Comment) (casual in scrubs) Eye Contact: Good Motor Activity: Tremors Speech: Logical/coherent Level of Consciousness: Alert Mood: Depressed;Anxious Affect: Appropriate to circumstance Anxiety Level: Moderate Thought Processes: Coherent;Relevant Judgement: Unimpaired Orientation: Person;Place;Time;Situation Obsessive Compulsive Thoughts/Behaviors: None  Cognitive Functioning Concentration: Normal Memory: Recent Intact;Remote Intact IQ: Average Insight: Fair Impulse Control: Poor Appetite: Good Weight Loss: 0 Weight Gain: 0 Sleep: Decreased Total Hours  of Sleep:  (Has to take medication to sleep) Vegetative Symptoms: None  ADLScreening [facility] Assessment Services) Patient's cognitive ability adequate to safely complete daily activities?: Yes Patient able to express need for assistance with ADLs?: Yes Independently performs ADLs?: Yes (appropriate for developmental age)  Abuse/Neglect [facility]) Physical Abuse: Denies Verbal Abuse: Denies Sexual Abuse: Denies  Prior Inpatient Therapy Prior Inpatient Therapy: Yes Prior Therapy Dates: 2010, 2011, 2012, 2014 Prior Therapy Facilty/Provider(s): [facility]; [facility];  [facility]; [facility], Reason for Treatment: SA  Prior Outpatient Therapy Prior Outpatient Therapy: Yes Prior Therapy Dates: ongoing Prior Therapy Facilty/Provider(s[facility] Reason for Treatment: Medication Management   ADL Screening (condition at time of admission) Patient's cognitive ability adequate to safely complete daily activities?: Yes Patient able to express need for assistance with ADLs?: Yes Independently performs ADLs?: Yes (appropriate for developmental age) Weakness of Legs: None Weakness of Arms/Hands: None  Home Assistive Devices/Equipment Home Assistive Devices/Equipment: None    Abuse/Neglect Assessment (Assessment to be complete while patient is alone) Physical Abuse: Denies Verbal Abuse: Denies Sexual Abuse: Denies Exploitation of patient/patient's resources: Denies Self-Neglect: Denies Values / Beliefs Cultural Requests During Hospitalization: None Spiritual Requests During Hospitalization: None Consults Spiritual Care Consult Needed: No Social Work Consult Needed: No Merchant navy officer (For Healthcare) Advance Directive: Patient does not have advance directive;Patient would not like information    Additional Information 1:1 In Past 12 Months?: No CIRT Risk: No Elopement Risk: No Does patient have medical clearance?: Yes     Disposition:  Disposition Initial Assessment Completed for this Encounter: Yes Disposition of Patient: Referred to;Inpatient treatment program Type of inpatient treatment program: Adult Patient referred to: Other (Comment) (Pending [facility])  On Site Evaluation by:   Reviewed with Physician: Pat Patrick, Rennis Harding 06/30/2012 12:56 PM

## 2012-06-30 NOTE — ED Notes (Signed)
Dr.Knapp at bedside  

## 2012-06-30 NOTE — ED Notes (Signed)
Message pharmacy to send medications to Pod E/CDU

## 2012-06-30 NOTE — ED Provider Notes (Signed)
History     CSN: [id number]  Arrival date & time 06/30/12  1023   First MD Initiated Contact with Patient 06/30/12 1139      Chief Complaint  Patient presents with  . Alcohol Problem    (Consider location/radiation/quality/duration/timing/severity/associated sxs/prior treatment) HPI  Patient reports he has been abusing alcohol for over 20 years, however he relates he was not aware he was an alcoholic until the  last 4-5 years. He states he has a seizure disorder for the past 5 years and he has had 2 seizures that he relates to alcohol use. He relates one seizure was from drinking too much alcohol and the other was from not drinking alcohol when trying to get sober. He states he's been in detox over 10 times. He states the longest he was sober was the 9 months when he was in prison. He reports his last detox was about a month ago at behavioral health. He relates he was sober for 3 weeks however he has been drinking the last one to 2 weeks. He states he is drinking 20 ounces of beer a day and about a week ago he did drink a pint of liquor. He states he has some depression but feels he's depressed about his drinking. He denies suicidal or homicidal ideation. He states he did have a bed at [facility] yesterday however he had a positive breathalyzer so they would not let him stay. He states his last drink was about 24 hours ago. He states he feels shaky and has some mild nausea right now. He states his last seizure was 2-3 months ago.  Pt also has chronic back pain and is on neurontin for it. States he was started on mobic for his back pain but he isn't taking it anymore states "It's like taking aspirin".   PCP none Psychiatrist Dr. Christin Fudge of family services  Past Medical History  Diagnosis Date  . Hepatitis C   . Chronic back pain   . A-fib   . Depression   . Anxiety   . Hepatitis C     history of  . Chronic back pain   . Acid reflux   . History of urinary frequency   . Peripheral  neuropathy     hands and feet  . Hypertension   . Polysubstance abuse 01/25/2011  . Alcohol abuse   . Seizures     Past Surgical History  Procedure Laterality Date  . Cardioversion  03/07/2006    History reviewed. No pertinent family history.  History  Substance Use Topics  . Smoking status: Current Every Day Smoker -- 1.00 packs/day for 25 years    Types: Cigarettes  . Smokeless tobacco: Never Used  . Alcohol Use: 0.0 oz/week     Comment: 1/5 Vodka; 1-40oz Daily    Unemployed Last crack was one month ago   Review of Systems  All other systems reviewed and are negative.    Allergies  Review of patient's allergies indicates no known allergies.  Home Medications   Current Outpatient Rx  Name  Route  Sig  Dispense  Refill  . aspirin EC 81 MG tablet   Oral   Take 1 tablet (81 mg total) by mouth daily. Blood thinner for heart health         . divalproex (DEPAKOTE ER) 250 MG 24 hr tablet      Take 250 mg daily and 750 mg at bedtime for mood stabilization   120 tablet   0   .  meloxicam (MOBIC) 7.5 MG tablet   Oral   Take 1 tablet (7.5 mg total) by mouth 2 (two) times daily. For arthritis pain   60 tablet  NOT TAKING  0   . traZODone (DESYREL) 50 MG tablet   Oral   Take 1 tablet (50 mg total) by mouth at bedtime as needed for sleep (May repeat x1). For sleep   60 tablet   0   neurontin 300 mg BID  BP 128/85  Pulse 79  Temp(Src) 99 F (37.2 C) (Oral)  Resp 24  Wt 199 lb (90.266 kg)  BMI 30.26 kg/m2  SpO2 96%  Vital signs normal    Physical Exam  Nursing note and vitals reviewed. Constitutional: He is oriented to person, place, and time. He appears well-developed and well-nourished.  Non-toxic appearance. He does not appear ill. No distress.  HENT:  Head: Normocephalic and atraumatic.  Right Ear: External ear normal.  Left Ear: External ear normal.  Nose: Nose normal. No mucosal edema or rhinorrhea.  Mouth/Throat: Oropharynx is clear and  moist and mucous membranes are normal. No dental abscesses or edematous.  Eyes: Conjunctivae and EOM are normal. Pupils are equal, round, and reactive to light.  Neck: Normal range of motion and full passive range of motion without pain. Neck supple.  Cardiovascular: Normal rate, regular rhythm and normal heart sounds.  Exam reveals no gallop and no friction rub.   No murmur heard. Pulmonary/Chest: Effort normal and breath sounds normal. No respiratory distress. He has no wheezes. He has no rhonchi. He has no rales. He exhibits no tenderness and no crepitus.  Abdominal: Soft. Normal appearance and bowel sounds are normal. He exhibits no distension. There is no tenderness. There is no rebound and no guarding.  Musculoskeletal: Normal range of motion. He exhibits no edema and no tenderness.  Moves all extremities well.   Neurological: He is alert and oriented to person, place, and time. He has normal strength. No cranial nerve deficit.  Mild tremor  Skin: Skin is warm, dry and intact. No rash noted. No erythema. No pallor.  Psychiatric: He has a normal mood and affect. His speech is normal and behavior is normal. His mood appears not anxious.    ED Course  Procedures (including critical care time)  Pt moved to Pod C  12:16 Kristen, ACT will evaluate patient.    Results for orders placed during the hospital encounter of 06/30/12  ACETAMINOPHEN LEVEL      Result Value Range   Acetaminophen (Tylenol), Serum <15.0  10 - 30 ug/mL  CBC      Result Value Range   WBC 8.7  4.0 - 10.5 K/uL   RBC 5.44  4.22 - 5.81 MIL/uL   Hemoglobin 17.8 (*) 13.0 - 17.0 g/dL   HCT 81.1  91.4 - 78.2 %   MCV 92.6  78.0 - 100.0 fL   MCH 32.7  26.0 - 34.0 pg   MCHC 35.3  30.0 - 36.0 g/dL   RDW 95.6  21.3 - 08.6 %   Platelets 239  150 - 400 K/uL  COMPREHENSIVE METABOLIC PANEL      Result Value Range   Sodium 137  135 - 145 mEq/L   Potassium 4.5  3.5 - 5.1 mEq/L   Chloride 100  96 - 112 mEq/L   CO2 23  19 -  32 mEq/L   Glucose, Bld 126 (*) 70 - 99 mg/dL   BUN 13  6 - 23 mg/dL  Creatinine, Ser 0.93  0.50 - 1.35 mg/dL   Calcium 9.7  8.4 - 16.1 mg/dL   Total Protein 8.7 (*) 6.0 - 8.3 g/dL   Albumin 4.2  3.5 - 5.2 g/dL   AST 28  0 - 37 U/L   ALT 46  0 - 53 U/L   Alkaline Phosphatase 74  39 - 117 U/L   Total Bilirubin 1.0  0.3 - 1.2 mg/dL   GFR calc non Af Amer >90  >90 mL/min   GFR calc Af Amer >90  >90 mL/min  ETHANOL      Result Value Range   Alcohol, Ethyl (B) <11  0 - 11 mg/dL  SALICYLATE LEVEL      Result Value Range   Salicylate Lvl <2.0 (*) 2.8 - 20.0 mg/dL  URINE RAPID DRUG SCREEN (HOSP PERFORMED)      Result Value Range   Opiates NONE DETECTED  NONE DETECTED   Cocaine NONE DETECTED  NONE DETECTED   Benzodiazepines POSITIVE (*) NONE DETECTED   Amphetamines NONE DETECTED  NONE DETECTED   Tetrahydrocannabinol NONE DETECTED  NONE DETECTED   Barbiturates NONE DETECTED  NONE DETECTED  VALPROIC ACID LEVEL      Result Value Range   Valproic Acid Lvl 18.6 (*) 50.0 - 100.0 ug/mL   Laboratory interpretation all normal except +UDS, subtherapeutic valproic acid       1. Alcohol abuse   2. Depression   3. Seizure disorder     Plan per ACT  Devoria Albe, MD, FACEP   MDM          Ward Givens, MD 06/30/12 [phone]

## 2012-06-30 NOTE — ED Notes (Signed)
Baxter Hire, ACT team at bedside

## 2012-06-30 NOTE — ED Notes (Signed)
Pt arrives requesting detox from alcohol. States he was here 1 mo ago. Had 3 weeks of sobriety and last week started drinking beer again with family. States last drink was yesterday. States drank 2-40oz beers and 25oz beer. Took his medicine and went to sleep. States hx of DT's with seizures.

## 2012-07-01 NOTE — ED Notes (Signed)
Ambulated to bathroom--no tremors- requesting to shower-- and shave

## 2012-07-02 NOTE — BH Assessment (Signed)
[facility] Assessment Progress Note      SPoke to patient about his symptoms.  He reports some shakiness sometimes and a headache currently but speculates it could be related the way he slept.  This Clinical research associate explained that we have been unable to find an available bed for him at this time and he stated that his probation officer wants him to go straight to treatment from here.  This Clinical research associate gave him a list of facilities so that he might call this morning.

## 2012-07-02 NOTE — ED Provider Notes (Signed)
Pt with chronic alcohol abuse.  Unable to place.  Will d/c with parole officer.  No SI.  Rolan Bucco, MD 07/02/12 0930

## 2012-07-02 NOTE — BH Assessment (Signed)
Assessment Note  Per last clinician on shift, unable to find pt a bed at this time and he was given a list of facilities to call for SA treatment.  EDP Belfi discharged pt with his parole officer.  Updated assessment disposition and ED staff.    Disposition:  Disposition Initial Assessment Completed for this Encounter: Yes Disposition of Patient: Referred to;Outpatient treatment Type of inpatient treatment program: Adult Type of outpatient treatment: [facility] - Intensive Outpatient;Adult Patient referred to: Outpatient clinic referral  On Site Evaluation by:   Reviewed with Physician:  Oris Drone 07/02/2012 12:35 PM

## 2012-09-04 ENCOUNTER — Emergency Department (HOSPITAL_COMMUNITY)
Admission: EM | Admit: 2012-09-04 | Discharge: 2012-09-04 | Disposition: A | Discharge status: Home / Self Care | Payer: No Typology Code available for payment source | Attending: Emergency Medicine | Admitting: Emergency Medicine

## 2012-09-04 ENCOUNTER — Encounter (HOSPITAL_COMMUNITY): Payer: Self-pay | Admitting: *Deleted

## 2012-09-04 DIAGNOSIS — F17200 Nicotine dependence, unspecified, uncomplicated: Secondary | ICD-10-CM | POA: Insufficient documentation

## 2012-09-04 DIAGNOSIS — F329 Major depressive disorder, single episode, unspecified: Secondary | ICD-10-CM | POA: Insufficient documentation

## 2012-09-04 DIAGNOSIS — Z7982 Long term (current) use of aspirin: Secondary | ICD-10-CM | POA: Insufficient documentation

## 2012-09-04 DIAGNOSIS — G8929 Other chronic pain: Secondary | ICD-10-CM | POA: Insufficient documentation

## 2012-09-04 DIAGNOSIS — M549 Dorsalgia, unspecified: Secondary | ICD-10-CM | POA: Insufficient documentation

## 2012-09-04 DIAGNOSIS — I10 Essential (primary) hypertension: Secondary | ICD-10-CM | POA: Insufficient documentation

## 2012-09-04 DIAGNOSIS — Z8619 Personal history of other infectious and parasitic diseases: Secondary | ICD-10-CM | POA: Insufficient documentation

## 2012-09-04 DIAGNOSIS — Z8669 Personal history of other diseases of the nervous system and sense organs: Secondary | ICD-10-CM | POA: Insufficient documentation

## 2012-09-04 DIAGNOSIS — F411 Generalized anxiety disorder: Secondary | ICD-10-CM | POA: Insufficient documentation

## 2012-09-04 DIAGNOSIS — Z8679 Personal history of other diseases of the circulatory system: Secondary | ICD-10-CM | POA: Insufficient documentation

## 2012-09-04 DIAGNOSIS — Z87828 Personal history of other (healed) physical injury and trauma: Secondary | ICD-10-CM | POA: Insufficient documentation

## 2012-09-04 DIAGNOSIS — Z8719 Personal history of other diseases of the digestive system: Secondary | ICD-10-CM | POA: Insufficient documentation

## 2012-09-04 DIAGNOSIS — Z79899 Other long term (current) drug therapy: Secondary | ICD-10-CM | POA: Insufficient documentation

## 2012-09-04 DIAGNOSIS — Z87448 Personal history of other diseases of urinary system: Secondary | ICD-10-CM | POA: Insufficient documentation

## 2012-09-04 DIAGNOSIS — F3289 Other specified depressive episodes: Secondary | ICD-10-CM | POA: Insufficient documentation

## 2012-09-04 MED ORDER — KETOROLAC TROMETHAMINE 60 MG/2ML IM SOLN
60.0000 mg | Freq: Once | INTRAMUSCULAR | Status: AC
  Administered 2012-09-04: 60 mg via INTRAMUSCULAR
  Filled 2012-09-04: qty 2

## 2012-09-04 MED ORDER — TRAMADOL HCL 50 MG PO TABS
50.0000 mg | ORAL_TABLET | Freq: Once | ORAL | Status: AC
  Administered 2012-09-04: 50 mg via ORAL
  Filled 2012-09-04: qty 1

## 2012-09-04 MED ORDER — DEXAMETHASONE SODIUM PHOSPHATE 10 MG/ML IJ SOLN
10.0000 mg | Freq: Once | INTRAMUSCULAR | Status: AC
  Administered 2012-09-04: 10 mg via INTRAMUSCULAR
  Filled 2012-09-04: qty 1

## 2012-09-04 MED ORDER — TRAMADOL HCL 50 MG PO TABS: 50.0000 mg | ORAL_TABLET | Freq: Four times a day (QID) | ORAL | Status: DC | PRN

## 2012-09-04 NOTE — ED Notes (Signed)
Reports hx of back problems but has been doing some work lately and now having more severe pain to entire spine x 1 month. Ambulatory at triage.

## 2012-09-04 NOTE — ED Provider Notes (Signed)
CSN: [id number]     Arrival date & time 09/04/12  1807 History  This chart was scribed for non-physician practitioner working with Suzi Roots, MD, by Ardelia Mems ED Scribe. This patient was seen in room TR09C/TR09C and the patient's care was started at 8:10 PM.   First MD Initiated Contact with Patient 09/04/12 1935     Chief Complaint  Patient presents with  . Back Pain    The history is provided by the patient. No language interpreter was used.   HPI Comments: Blake Arnold is a 42 y.o. male with a history of hepatitis C and chronic back pain who presents to the Emergency Department complaining of 1 month of gradual onset, gradually worsening, constant, moderate back pain which radiates to his legs which became severe today. He states that his hips occasionally "lock up" due to the pain. He states that his pain is worse with sitting, and slightly improved with walking. He states that he has a history of back injury in 2011, but he states that he has not injured his back since, and he has not had significant pain until a month ago. He states that he has not been seen by his PCP or in the ED this month, but he states that he called his PCP, who increased his dosages of Neurontin and Diclofenac. He denies bladder or bowel incontinence, fever, chills, nausea, vomiting or any other symptoms. He states that he has a history of smoking and snorting crack and cocaine, along with heavy alcohol drinking, but he states that he quit all of this in May 2014.   PCP- Dr. Dartha Lodge  Past Medical History  Diagnosis Date  . Hepatitis C   . Chronic back pain   . A-fib   . Depression   . Anxiety   . Hepatitis C     history of  . Chronic back pain   . Acid reflux   . History of urinary frequency   . Peripheral neuropathy     hands and feet  . Hypertension   . Polysubstance abuse 01/25/2011  . Alcohol abuse   . Seizures    Past Surgical History  Procedure Laterality Date  .  Cardioversion  03/07/2006   History reviewed. No pertinent family history. History  Substance Use Topics  . Smoking status: Current Every Day Smoker -- 1.00 packs/day for 25 years    Types: Cigarettes  . Smokeless tobacco: Never Used  . Alcohol Use: 0.0 oz/week     Comment: 1/5 Vodka; 1-40oz Daily     Review of Systems  Constitutional: Negative for fever and chills.  Gastrointestinal: Negative for nausea and vomiting.       Denies bowel incontinence.  Genitourinary:       Denies bladder incontinence.  Musculoskeletal: Positive for back pain.  All other systems reviewed and are negative.    Allergies  Review of patient's allergies indicates no known allergies.  Home Medications   Current Outpatient Rx  Name  Route  Sig  Dispense  Refill  . acamprosate (CAMPRAL) 333 MG tablet   Oral   Take 666 mg by mouth 3 (three) times daily.         [name] aspirin EC 81 MG tablet   Oral   Take 81 mg by mouth daily.         . diclofenac (VOLTAREN) 50 MG EC tablet   Oral   Take 50 mg by mouth 3 (three) times daily.         [name]  divalproex (DEPAKOTE) 500 MG DR tablet   Oral   Take 500 mg by mouth 2 (two) times daily.         [name] gabapentin (NEURONTIN) 300 MG capsule   Oral   Take 300 mg by mouth 4 (four) times daily.          Triage Vitals: BP 162/99  Pulse 101  Temp(Src) 98.5 F (36.9 C) (Oral)  Resp 17  Ht 5\' 8"  (1.727 m)  Wt 204 lb (92.534 kg)  BMI 31.03 kg/m2  SpO2 98%  Physical Exam  Nursing note and vitals reviewed. Constitutional: He is oriented to person, place, and time. He appears well-developed and well-nourished. No distress.  HENT:  Head: Normocephalic and atraumatic.  Right Ear: External ear normal.  Left Ear: External ear normal.  Nose: Nose normal.  Eyes: Conjunctivae are normal.  Neck: Normal range of motion. No tracheal deviation present.  Cardiovascular: Normal rate, regular rhythm and normal heart sounds.   Pulmonary/Chest: Effort normal and  breath sounds normal. No stridor.  Abdominal: Soft. He exhibits no distension. There is no tenderness.  Musculoskeletal: Normal range of motion.       Cervical back: He exhibits tenderness. He exhibits no bony tenderness.       Thoracic back: He exhibits tenderness. He exhibits no bony tenderness.       Lumbar back: He exhibits tenderness. He exhibits no bony tenderness.  Tender to palpation along paraspinal muscles.  No deformity or step offs  Neurological: He is alert and oriented to person, place, and time.  Strength 5/5 in all extremities. Neurovascularly intact.  Skin: Skin is warm and dry. He is not diaphoretic.  Psychiatric: He has a normal mood and affect. His behavior is normal.    ED Course   Procedures (including critical care time)  DIAGNOSTIC STUDIES: Oxygen Saturation is 98% on RA, normal by my interpretation.    COORDINATION OF CARE: 8:11 PM- Pt advised of plan to receive medications to relieve his pain in the ED as well as a prescription for Tramadol upon discharge and pt agrees.  Medications  ketorolac (TORADOL) injection 60 mg (not administered)  dexamethasone (DECADRON) injection 10 mg (not administered)  traMADol (ULTRAM) tablet 50 mg (not administered)     Labs Reviewed - No data to display  No results found.  1. Back pain     MDM  Patient with back pain.  No neurological deficits and normal neuro exam.  Patient can walk but states is painful.  No loss of bowel or bladder control.  No concern for cauda equina.  No fever, night sweats, weight loss, h/o cancer, IVDU.  RICE protocol and pain medicine indicated and discussed with patient.     I personally performed the services described in this documentation, which was scribed in my presence. The recorded information has been reviewed and is accurate.    Mora Bellman, PA-C 09/04/12 2355

## 2012-09-08 NOTE — ED Provider Notes (Signed)
Medical screening examination/treatment/procedure(s) were performed by non-physician practitioner and as supervising physician I was immediately available for consultation/collaboration.   Suzi Roots, MD 09/08/12 [phone]

## 2012-10-23 ENCOUNTER — Emergency Department (HOSPITAL_COMMUNITY)
Admission: EM | Admit: 2012-10-23 | Discharge: 2012-10-24 | Disposition: A | Discharge status: Other Acute Inpatient Hospital | Payer: No Typology Code available for payment source | Attending: Emergency Medicine | Admitting: Emergency Medicine

## 2012-10-23 ENCOUNTER — Encounter (HOSPITAL_COMMUNITY): Payer: Self-pay | Admitting: Family Medicine

## 2012-10-23 DIAGNOSIS — I10 Essential (primary) hypertension: Secondary | ICD-10-CM | POA: Insufficient documentation

## 2012-10-23 DIAGNOSIS — Z8719 Personal history of other diseases of the digestive system: Secondary | ICD-10-CM | POA: Insufficient documentation

## 2012-10-23 DIAGNOSIS — Z8669 Personal history of other diseases of the nervous system and sense organs: Secondary | ICD-10-CM | POA: Insufficient documentation

## 2012-10-23 DIAGNOSIS — Z8739 Personal history of other diseases of the musculoskeletal system and connective tissue: Secondary | ICD-10-CM | POA: Insufficient documentation

## 2012-10-23 DIAGNOSIS — Z8679 Personal history of other diseases of the circulatory system: Secondary | ICD-10-CM | POA: Insufficient documentation

## 2012-10-23 DIAGNOSIS — Z79899 Other long term (current) drug therapy: Secondary | ICD-10-CM | POA: Insufficient documentation

## 2012-10-23 DIAGNOSIS — F329 Major depressive disorder, single episode, unspecified: Secondary | ICD-10-CM

## 2012-10-23 DIAGNOSIS — F3289 Other specified depressive episodes: Secondary | ICD-10-CM | POA: Insufficient documentation

## 2012-10-23 DIAGNOSIS — F32A Depression, unspecified: Secondary | ICD-10-CM

## 2012-10-23 DIAGNOSIS — F1010 Alcohol abuse, uncomplicated: Secondary | ICD-10-CM | POA: Insufficient documentation

## 2012-10-23 DIAGNOSIS — F1910 Other psychoactive substance abuse, uncomplicated: Secondary | ICD-10-CM

## 2012-10-23 DIAGNOSIS — Z87448 Personal history of other diseases of urinary system: Secondary | ICD-10-CM | POA: Insufficient documentation

## 2012-10-23 DIAGNOSIS — Z7982 Long term (current) use of aspirin: Secondary | ICD-10-CM | POA: Insufficient documentation

## 2012-10-23 DIAGNOSIS — Z8619 Personal history of other infectious and parasitic diseases: Secondary | ICD-10-CM | POA: Insufficient documentation

## 2012-10-23 DIAGNOSIS — F411 Generalized anxiety disorder: Secondary | ICD-10-CM | POA: Insufficient documentation

## 2012-10-23 DIAGNOSIS — R45851 Suicidal ideations: Secondary | ICD-10-CM

## 2012-10-23 LAB — COMPREHENSIVE METABOLIC PANEL
ALT: 39 U/L (ref 0–53)
Albumin: 3.6 g/dL (ref 3.5–5.2)
Alkaline Phosphatase: 54 U/L (ref 39–117)
Calcium: 9 mg/dL (ref 8.4–10.5)
Potassium: 4.1 mEq/L (ref 3.5–5.1)
Sodium: 139 mEq/L (ref 135–145)
Total Protein: 7.5 g/dL (ref 6.0–8.3)

## 2012-10-23 LAB — RAPID URINE DRUG SCREEN, HOSP PERFORMED
Amphetamines: NOT DETECTED
Barbiturates: NOT DETECTED
Benzodiazepines: NOT DETECTED
Cocaine: POSITIVE — AB
Opiates: NOT DETECTED
Tetrahydrocannabinol: NOT DETECTED

## 2012-10-23 LAB — COMPREHENSIVE METABOLIC PANEL WITH GFR
AST: 27 U/L (ref 0–37)
BUN: 11 mg/dL (ref 6–23)
CO2: 25 meq/L (ref 19–32)
Chloride: 102 meq/L (ref 96–112)
Creatinine, Ser: 0.91 mg/dL (ref 0.50–1.35)
GFR calc Af Amer: >90 mL/min (ref >90)
GFR calc non Af Amer: >90 mL/min (ref >90)
Glucose, Bld: 81 mg/dL (ref 70–99)
Total Bilirubin: 0.2 mg/dL — ABNORMAL LOW (ref 0.3–1.2)

## 2012-10-23 LAB — CBC
HCT: 46 % (ref 39.0–52.0)
Hemoglobin: 16.6 g/dL (ref 13.0–17.0)
MCH: 34.7 pg — ABNORMAL HIGH (ref 26.0–34.0)
MCHC: 36.1 g/dL — ABNORMAL HIGH (ref 30.0–36.0)
MCV: 96.2 fL (ref 78.0–100.0)
Platelets: 196 K/uL (ref 150–400)
RBC: 4.78 MIL/uL (ref 4.22–5.81)
RDW: 13.5 % (ref 11.5–15.5)
WBC: 8.6 K/uL (ref 4.0–10.5)

## 2012-10-23 LAB — ETHANOL: Alcohol, Ethyl (B): 179 mg/dL — ABNORMAL HIGH (ref 0–11)

## 2012-10-23 LAB — VALPROIC ACID LEVEL: Valproic Acid Lvl: 50.6 ug/mL (ref 50.0–100.0)

## 2012-10-23 LAB — SALICYLATE LEVEL: Salicylate Lvl: <2 mg/dL — ABNORMAL LOW (ref 2.8–20.0)

## 2012-10-23 LAB — ACETAMINOPHEN LEVEL: Acetaminophen (Tylenol), Serum: <15 ug/mL (ref 10–30)

## 2012-10-23 MED ORDER — LORAZEPAM 1 MG PO TABS
0.0000 mg | ORAL_TABLET | Freq: Four times a day (QID) | ORAL | Status: DC
  Administered 2012-10-23: 1 mg via ORAL
  Filled 2012-10-23: qty 1

## 2012-10-23 MED ORDER — IBUPROFEN 400 MG PO TABS: 600.0000 mg | ORAL_TABLET | Freq: Three times a day (TID) | ORAL | Status: DC | PRN

## 2012-10-23 MED ORDER — ACETAMINOPHEN 325 MG PO TABS
650.0000 mg | ORAL_TABLET | Freq: Four times a day (QID) | ORAL | Status: DC | PRN
  Administered 2012-10-23: 650 mg via ORAL
  Filled 2012-10-23: qty 2

## 2012-10-23 MED ORDER — LORAZEPAM 1 MG PO TABS: 0.0000 mg | ORAL_TABLET | Freq: Two times a day (BID) | ORAL | Status: DC

## 2012-10-23 MED ORDER — VITAMIN B-1 100 MG PO TABS
100.0000 mg | ORAL_TABLET | Freq: Every day | ORAL | Status: DC
  Administered 2012-10-23 – 2012-10-24 (×2): 100 mg via ORAL
  Filled 2012-10-23 (×2): qty 1

## 2012-10-23 MED ORDER — MAGNESIUM HYDROXIDE 400 MG/5ML PO SUSP: 30.0000 mL | Freq: Every day | ORAL | Status: DC | PRN

## 2012-10-23 MED ORDER — LORAZEPAM 1 MG PO TABS
1.0000 mg | ORAL_TABLET | Freq: Four times a day (QID) | ORAL | Status: DC | PRN
  Administered 2012-10-24: 1 mg via ORAL
  Filled 2012-10-23: qty 1

## 2012-10-23 MED ORDER — LORAZEPAM 1 MG PO TABS
0.0000 mg | ORAL_TABLET | Freq: Four times a day (QID) | ORAL | Status: DC
  Filled 2012-10-23: qty 1

## 2012-10-23 MED ORDER — NICOTINE 21 MG/24HR TD PT24: 21.0000 mg | MEDICATED_PATCH | Freq: Every day | TRANSDERMAL | Status: DC

## 2012-10-23 MED ORDER — LORAZEPAM 2 MG/ML IJ SOLN: 1.0000 mg | Freq: Four times a day (QID) | INTRAMUSCULAR | Status: DC | PRN

## 2012-10-23 MED ORDER — ADULT MULTIVITAMIN W/MINERALS CH
1.0000 | ORAL_TABLET | Freq: Every day | ORAL | Status: DC
  Administered 2012-10-23 – 2012-10-24 (×2): 1 via ORAL
  Filled 2012-10-23 (×2): qty 1

## 2012-10-23 MED ORDER — ONDANSETRON HCL 4 MG PO TABS
4.0000 mg | ORAL_TABLET | Freq: Three times a day (TID) | ORAL | Status: DC | PRN
  Administered 2012-10-23: 4 mg via ORAL
  Filled 2012-10-23: qty 1

## 2012-10-23 MED ORDER — ALUM & MAG HYDROXIDE-SIMETH 200-200-20 MG/5ML PO SUSP: 30.0000 mL | ORAL | Status: DC | PRN

## 2012-10-23 MED ORDER — THIAMINE HCL 100 MG/ML IJ SOLN: 100.0000 mg | Freq: Every day | INTRAMUSCULAR | Status: DC

## 2012-10-23 MED ORDER — FOLIC ACID 1 MG PO TABS
1.0000 mg | ORAL_TABLET | Freq: Every day | ORAL | Status: DC
  Administered 2012-10-23 – 2012-10-24 (×2): 1 mg via ORAL
  Filled 2012-10-23 (×2): qty 1

## 2012-10-23 MED ORDER — LORAZEPAM 1 MG PO TABS
0.0000 mg | ORAL_TABLET | Freq: Four times a day (QID) | ORAL | Status: DC
  Administered 2012-10-23 (×2): 1 mg via ORAL
  Filled 2012-10-23: qty 1

## 2012-10-23 NOTE — ED Notes (Addendum)
ACT team telepsy will be at 1545hrs.

## 2012-10-23 NOTE — ED Provider Notes (Signed)
TTS recs InPt due to SI with plan and EtOH abuse. 1630  Hurman Horn, MD 10/23/12 2322

## 2012-10-23 NOTE — ED Notes (Signed)
Report received from Chris, RN

## 2012-10-23 NOTE — ED Notes (Signed)
Pt talking with ACT team via telepsych

## 2012-10-23 NOTE — BH Assessment (Signed)
Tele Assessment Note   Blake Arnold is an 42 y.o. male that was assessed this day via tele assessment after presenting to [facility] reporting SI with plans to hang or shoot self.  Pt stated he came to the ED because of SI as well as because he relapsed 2 weeks ago and has been drinking 3 40 oz beers per day, last drink was this morning and pt had one 40 oz beer.  Pt stated he also used i gram of cocaine yesterday, and that he only occasionally uses that.  Pt stated he has been sober since the end of May of this year, but continues to struggle with his living situation (he lives with his mother), having no work, and having conflict with his ex-wife.  Pt stated she came back and left him again over the weekend.  Pt denies HI or psychosis.  Pt does have a hx of seizures after trying to detox before, last seizure was at [facility] of [facility].  Pt has been in detox several times in the past.  Pt currently is followed by [facility] [place].  Pt was last seen at [facility] at the end of May and was discharged home to follow up with outpatient services.  Pt stated his only withdrawal sx currently is feeling sweaty and clammy.  Pt denies HI or psychosis.  Upon completion of tele assessment, consulted with EDP Bednar @ [phone], who was in agreement with pt receiving inpatient services for stabilization.  Pt to be ran at [facility] when beds available per Berneice Heinrich, [facility], at [facility].  TTS staff will search for beds elsewhere as well.  Updated TTS staff and [facility] staff.  Axis I: 296.33 Major Depressive Disorder, Recurrent, Severe Without Psychotic Features, 305.00 Alcohol Abuse Axis II: Deferred Axis III:  Past Medical History  Diagnosis Date  . Hepatitis C   . Chronic back pain   . A-fib   . Depression   . Anxiety   . Hepatitis C     history of  . Chronic back pain   . Acid reflux   . History of urinary frequency   . Peripheral neuropathy     hands and feet  . Hypertension   . Polysubstance abuse 01/25/2011  . Alcohol  abuse   . Seizures    Axis IV: economic problems, housing problems, occupational problems, other psychosocial or environmental problems, problems related to social environment, problems with access to health care services and problems with primary support group Axis V: 21-30 behavior considerably influenced by delusions or hallucinations OR serious impairment in judgment, communication OR inability to function in almost all areas  Past Medical History:  Past Medical History  Diagnosis Date  . Hepatitis C   . Chronic back pain   . A-fib   . Depression   . Anxiety   . Hepatitis C     history of  . Chronic back pain   . Acid reflux   . History of urinary frequency   . Peripheral neuropathy     hands and feet  . Hypertension   . Polysubstance abuse 01/25/2011  . Alcohol abuse   . Seizures     Past Surgical History  Procedure Laterality Date  . Cardioversion  03/07/2006    Family History: History reviewed. No pertinent family history.  Social History:  reports that he has been smoking Cigarettes.  He has a 25 pack-year smoking history. He has never used smokeless tobacco. He reports that  drinks alcohol. He reports that he uses illicit drugs ("Crack" cocaine and Cocaine).  Additional Social History:  Alcohol / Drug Use Pain Medications: see MAR Prescriptions: see MAR Over the Counter: see MAR History of alcohol / drug use?: Yes Longest period of sobriety (when/how long): 3 months until 2 weeks ago when relapsed Negative Consequences of Use: Financial;Personal relationships;Work / Programmer, multimedia Withdrawal Symptoms: Sweats;Patient aware of relationship between substance abuse and physical/medical complications Substance #1 Name of Substance 1: ETOH 1 - Age of First Use: 14 1 - Amount (size/oz): 3 40 oz beers 1 - Frequency: daily 1 - Duration: 2 weeks 1 - Last Use / Amount: Today - 1 40 oz beer Substance #2 Name of Substance 2: Crack cocaine 2 - Age of First Use: 15 2 - Amount  (size/oz): 1 gram 2 - Frequency: once 2 - Duration: once - used to use for years 2 - Last Use / Amount: Yesterday - 1 gram  CIWA: CIWA-Ar BP: 120/73 mmHg Pulse Rate: 78 Nausea and Vomiting: mild nausea with no vomiting Tactile Disturbances: very mild itching, pins and needles, burning or numbness Tremor: not visible, but can be felt fingertip to fingertip Auditory Disturbances: not present Paroxysmal Sweats: barely perceptible sweating, palms moist Visual Disturbances: very mild sensitivity Anxiety: mildly anxious Headache, Fullness in Head: very mild Agitation: somewhat more than normal activity Orientation and Clouding of Sensorium: oriented and can do serial additions CIWA-Ar Total: 8 COWS:    Allergies: No Known Allergies  Home Medications:  (Not in a hospital admission)  OB/GYN Status:  No LMP for male patient.  General Assessment Data Location of Assessment: [facility] ED Is this a Tele or Face-to-Face Assessment?: Tele Assessment Is this an Initial Assessment or a Re-assessment for this encounter?: Initial Assessment Living Arrangements: Parent Can pt return to current living arrangement?: Yes Admission Status: Voluntary Is patient capable of signing voluntary admission?: Yes Transfer from: Acute Hospital Referral Source: Self/Family/Friend  Medical Screening Exam [facility] ONLY) Medical Exam completed:  (na)  [facility] Crisis Care Plan Living Arrangements: Parent Name of Psychiatrist: [facility] [place] Name of Therapist: [facility]  Education Status Is patient currently in school?: No  Risk to self Suicidal Ideation: Yes-Currently Present Suicidal Intent: Yes-Currently Present Is patient at risk for suicide?: Yes Suicidal Plan?: Yes-Currently Present Specify Current Suicidal Plan: to hang, shoot self or "drink myself to death" Access to Means: Yes Specify Access to Suicidal Means: can access sharps and alcohol What has been  your use of drugs/alcohol within the last 12 months?: pt relapsed on alcohol 2 weeks ago Previous Attempts/Gestures: No How many times?: 0 Other Self Harm Risks: pt denies Triggers for Past Attempts: None known Intentional Self Injurious Behavior: Damaging Comment - Self Injurious Behavior: ongoing SA Family Suicide History: No Recent stressful life event(s): Conflict (Comment);Loss (Comment);Financial Problems;Recent negative physical changes;Turmoil (Comment) (living arrangements, SA, conflict with ex-wife, SI) Persecutory voices/beliefs?: No Depression: Yes Depression Symptoms: Despondent;Insomnia;Isolating;Loss of interest in usual pleasures;Feeling worthless/self pity;Feeling angry/irritable Substance abuse history and/or treatment for substance abuse?: Yes Suicide prevention information given to non-admitted patients: Not applicable  Risk to Others Homicidal Ideation: No Thoughts of Harm to Others: No Current Homicidal Intent: No Current Homicidal Plan: No Access to Homicidal Means: No Identified Victim: pt denies History of harm to others?: No Assessment of Violence: None Noted Violent Behavior Description: na - pt calm, cooperative Does patient have access to weapons?: No Criminal Charges Pending?: No Does patient have a  court date: No  Psychosis Hallucinations: None noted Delusions: None noted  Mental Status Report Appear/Hygiene: Disheveled Eye Contact: Good Motor Activity: Freedom of movement;Unremarkable Speech: Logical/coherent Level of Consciousness: Alert Mood: Depressed;Anxious Affect: Depressed;Anxious Anxiety Level: Moderate Thought Processes: Coherent;Relevant Judgement: Unimpaired Orientation: Person;Place;Time;Situation;Appropriate for developmental age Obsessive Compulsive Thoughts/Behaviors: None  Cognitive Functioning Concentration: Decreased Memory: Recent Intact;Remote Intact IQ: Average Insight: Poor Impulse Control: Poor Appetite:  Poor Weight Loss: 0 Weight Gain: 0 Sleep: Decreased Total Hours of Sleep:  (Reprots trouble sleeping) Vegetative Symptoms: None  ADLScreening [facility] Assessment Services) Patient's cognitive ability adequate to safely complete daily activities?: Yes Patient able to express need for assistance with ADLs?: No Independently performs ADLs?: Yes (appropriate for developmental age)  Prior Inpatient Therapy Prior Inpatient Therapy: Yes Prior Therapy Dates: 2011, 2012, 2014 Prior Therapy Facilty/Provider(s): [facility], [facility], [facility], [facility] Reason for Treatment: SA  Prior Outpatient Therapy Prior Outpatient Therapy: Yes Prior Therapy Facilty/Provider(s): [facility] [place] Reason for Treatment: med mgnt  ADL Screening (condition at time of admission) Patient's cognitive ability adequate to safely complete daily activities?: Yes Is the patient deaf or have difficulty hearing?: No Does the patient have difficulty seeing, even when wearing glasses/contacts?: No Does the patient have difficulty concentrating, remembering, or making decisions?: No Patient able to express need for assistance with ADLs?: No Does the patient have difficulty dressing or bathing?: No Independently performs ADLs?: Yes (appropriate for developmental age) Does the patient have difficulty walking or climbing stairs?: No  Home Assistive Devices/Equipment Home Assistive Devices/Equipment: None    Abuse/Neglect Assessment (Assessment to be complete while patient is alone) Physical Abuse: Denies Verbal Abuse: Denies Sexual Abuse: Denies Exploitation of patient/patient's resources: Denies Self-Neglect: Denies Values / Beliefs Cultural Requests During Hospitalization: None Spiritual Requests During Hospitalization: None Consults Spiritual Care Consult Needed: No Social Work Consult Needed: No Merchant navy officer (For Healthcare) Advance Directive: Patient does not have advance directive;Patient would not  like information    Additional Information 1:1 In Past 12 Months?: No CIRT Risk: No Elopement Risk: No Does patient have medical clearance?: Yes     Disposition:  Disposition Initial Assessment Completed for this Encounter: Yes Disposition of Patient: Referred to;Inpatient treatment program Type of inpatient treatment program: Adult Patient referred to: Other (Comment) (Pending [facility])  Caryl Comes 10/23/2012 4:58 PM

## 2012-10-23 NOTE — ED Provider Notes (Addendum)
CSN: [id number]     Arrival date & time 10/23/12  1034 History   First MD Initiated Contact with Patient 10/23/12 1059     Chief Complaint  Patient presents with  . Suicidal  . Medical Clearance   (Consider location/radiation/quality/duration/timing/severity/associated sxs/prior Treatment) HPI Comments: Pt endorses stress due to family and also health, has constant neuropathy in hands and feet despite taking neurontin.  Has had thoughts of drinking himself to death, hanging himself.  Pt reports he took cocaine 2 days ago.  No CP.    Patient is a 42 y.o. male presenting with mental health disorder. The history is provided by the patient.  Mental Health Problem Presenting symptoms: depression and suicidal thoughts   Onset quality:  Gradual Duration:  4 weeks Timing:  Constant Progression:  Worsening Chronicity:  Recurrent Context: alcohol use and noncompliance   Treatment compliance:  Most of the time Time since last psychoactive medication taken:  1 day Relieved by:  Nothing Worsened by:  Alcohol and family interactions Ineffective treatments:  Anti-anxiety medications and mood stabilizers Associated symptoms: no abdominal pain and no chest pain   Risk factors: hx of mental illness     Past Medical History  Diagnosis Date  . Hepatitis C   . Chronic back pain   . A-fib   . Depression   . Anxiety   . Hepatitis C     history of  . Chronic back pain   . Acid reflux   . History of urinary frequency   . Peripheral neuropathy     hands and feet  . Hypertension   . Polysubstance abuse 01/25/2011  . Alcohol abuse   . Seizures    Past Surgical History  Procedure Laterality Date  . Cardioversion  03/07/2006   History reviewed. No pertinent family history. History  Substance Use Topics  . Smoking status: Current Every Day Smoker -- 1.00 packs/day for 25 years    Types: Cigarettes  . Smokeless tobacco: Never Used  . Alcohol Use: 0.0 oz/week     Comment: 1/5 Vodka; 1-40oz  Daily     Review of Systems  Unable to perform ROS: Psychiatric disorder  Constitutional: Negative for fever.  Respiratory: Negative for cough.   Cardiovascular: Negative for chest pain.  Gastrointestinal: Negative for abdominal pain.  Psychiatric/Behavioral: Positive for suicidal ideas.    Allergies  Review of patient's allergies indicates no known allergies.  Home Medications   Current Outpatient Rx  Name  Route  Sig  Dispense  Refill  . aspirin EC 81 MG tablet   Oral   Take 81 mg by mouth daily.         . diclofenac (VOLTAREN) 50 MG EC tablet   Oral   Take 50 mg by mouth 3 (three) times daily.         . divalproex (DEPAKOTE) 500 MG DR tablet   Oral   Take 500 mg by mouth 2 (two) times daily.         [name] gabapentin (NEURONTIN) 300 MG capsule   Oral   Take 300 mg by mouth 4 (four) times daily.          BP 120/73  Pulse 78  Temp(Src) 98 F (36.7 C)  Resp 18  SpO2 98% Physical Exam  Nursing note and vitals reviewed. Constitutional: He is oriented to person, place, and time. He appears well-developed and well-nourished. No distress.  HENT:  Head: Normocephalic and atraumatic.  Eyes: EOM are normal.  Neck:  Normal range of motion. Neck supple.  Cardiovascular: Normal rate, regular rhythm and intact distal pulses.   No murmur heard. Pulmonary/Chest: Effort normal. No respiratory distress. He has no wheezes. He has no rales.  Abdominal: Soft. There is no tenderness.  Musculoskeletal: He exhibits no tenderness.  Neurological: He is alert and oriented to person, place, and time. No cranial nerve deficit. He exhibits normal muscle tone. Coordination normal.  Skin: Skin is warm. He is not diaphoretic.    ED Course  Procedures (including critical care time) Labs Review Labs Reviewed  CBC - Abnormal; Notable for the following:    MCH 34.7 (*)    MCHC 36.1 (*)    All other components within normal limits  COMPREHENSIVE METABOLIC PANEL - Abnormal; Notable for  the following:    Total Bilirubin 0.2 (*)    All other components within normal limits  ETHANOL - Abnormal; Notable for the following:    Alcohol, Ethyl (B) 179 (*)    All other components within normal limits  SALICYLATE LEVEL - Abnormal; Notable for the following:    Salicylate Lvl <2.0 (*)    All other components within normal limits  URINE RAPID DRUG SCREEN (HOSP PERFORMED) - Abnormal; Notable for the following:    Cocaine POSITIVE (*)    All other components within normal limits  ACETAMINOPHEN LEVEL  VALPROIC ACID LEVEL   Imaging Review No results found.  RA sat is 95% and I interpret to be adequate  ECG at time 12:02 shows SR at rate 85, poor r wave progression leads V2-V3, normal axis, abn ECG.  No sig change from ECG on 04/17/11.  MDM   1. Suicidal ideation   2. Depression   3. Polysubstance abuse     Pt is depressed, some SI, worsening.  Alcoholic, h/o post withdrawal seizrues in the past.  Will put on CIWA protocol, check labs, consult TTS to evaluate for substance abuse, depression and SI.  Sitter ordered.  Based on history and exam, pt is medically cleared for now pending labs.  However he is at risk of withdrawal seizures, possibly DT's.  Pt will need close monitoring since pt last drank heavy yesterday and reports only 1 drink this AM.  Pt can be placed in pod C for psych holding.       Gavin Pound. Oletta Lamas, MD 10/23/12 1135  Gavin Pound. Dulse Rutan, MD 10/23/12 1255

## 2012-10-23 NOTE — ED Notes (Signed)
Per pt sts he is badly depressed and started drinking again 2 weeks ago. sts also did some cocaine 2 days ago. sts SI. Last drink this am.

## 2012-10-24 ENCOUNTER — Encounter (HOSPITAL_COMMUNITY): Payer: Self-pay | Admitting: *Deleted

## 2012-10-24 ENCOUNTER — Inpatient Hospital Stay (HOSPITAL_COMMUNITY)
Admission: AD | Admit: 2012-10-24 | Discharge: 2012-10-28 | DRG: 897 | Disposition: A | Discharge status: Home / Self Care | Payer: No Typology Code available for payment source | Source: Intra-hospital | Attending: Psychiatry | Admitting: Psychiatry

## 2012-10-24 DIAGNOSIS — I10 Essential (primary) hypertension: Secondary | ICD-10-CM | POA: Diagnosis present

## 2012-10-24 DIAGNOSIS — J219 Acute bronchiolitis, unspecified: Secondary | ICD-10-CM

## 2012-10-24 DIAGNOSIS — R569 Unspecified convulsions: Secondary | ICD-10-CM | POA: Diagnosis present

## 2012-10-24 DIAGNOSIS — F332 Major depressive disorder, recurrent severe without psychotic features: Secondary | ICD-10-CM | POA: Diagnosis present

## 2012-10-24 DIAGNOSIS — F411 Generalized anxiety disorder: Secondary | ICD-10-CM | POA: Diagnosis present

## 2012-10-24 DIAGNOSIS — Z653 Problems related to other legal circumstances: Secondary | ICD-10-CM

## 2012-10-24 DIAGNOSIS — F17200 Nicotine dependence, unspecified, uncomplicated: Secondary | ICD-10-CM | POA: Diagnosis present

## 2012-10-24 DIAGNOSIS — F10239 Alcohol dependence with withdrawal, unspecified: Secondary | ICD-10-CM

## 2012-10-24 DIAGNOSIS — F329 Major depressive disorder, single episode, unspecified: Secondary | ICD-10-CM

## 2012-10-24 DIAGNOSIS — G609 Hereditary and idiopathic neuropathy, unspecified: Secondary | ICD-10-CM | POA: Diagnosis present

## 2012-10-24 DIAGNOSIS — F1010 Alcohol abuse, uncomplicated: Secondary | ICD-10-CM | POA: Diagnosis present

## 2012-10-24 DIAGNOSIS — R45851 Suicidal ideations: Secondary | ICD-10-CM

## 2012-10-24 DIAGNOSIS — I4891 Unspecified atrial fibrillation: Secondary | ICD-10-CM | POA: Diagnosis present

## 2012-10-24 DIAGNOSIS — G8929 Other chronic pain: Secondary | ICD-10-CM | POA: Diagnosis present

## 2012-10-24 DIAGNOSIS — K219 Gastro-esophageal reflux disease without esophagitis: Secondary | ICD-10-CM | POA: Diagnosis present

## 2012-10-24 DIAGNOSIS — F1020 Alcohol dependence, uncomplicated: Principal | ICD-10-CM | POA: Diagnosis present

## 2012-10-24 DIAGNOSIS — B1920 Unspecified viral hepatitis C without hepatic coma: Secondary | ICD-10-CM | POA: Diagnosis present

## 2012-10-24 DIAGNOSIS — F322 Major depressive disorder, single episode, severe without psychotic features: Secondary | ICD-10-CM

## 2012-10-24 DIAGNOSIS — F319 Bipolar disorder, unspecified: Secondary | ICD-10-CM | POA: Diagnosis present

## 2012-10-24 DIAGNOSIS — Z5987 Material hardship due to limited financial resources, not elsewhere classified: Secondary | ICD-10-CM

## 2012-10-24 DIAGNOSIS — Z598 Other problems related to housing and economic circumstances: Secondary | ICD-10-CM

## 2012-10-24 DIAGNOSIS — Z5689 Other problems related to employment: Secondary | ICD-10-CM

## 2012-10-24 DIAGNOSIS — M549 Dorsalgia, unspecified: Secondary | ICD-10-CM | POA: Diagnosis present

## 2012-10-24 DIAGNOSIS — F1910 Other psychoactive substance abuse, uncomplicated: Secondary | ICD-10-CM

## 2012-10-24 MED ORDER — HYDROXYZINE HCL 25 MG PO TABS
25.0000 mg | ORAL_TABLET | Freq: Four times a day (QID) | ORAL | Status: AC | PRN
  Administered 2012-10-24 – 2012-10-26 (×3): 25 mg via ORAL
  Filled 2012-10-24: qty 1

## 2012-10-24 MED ORDER — CHLORDIAZEPOXIDE HCL 25 MG PO CAPS
25.0000 mg | ORAL_CAPSULE | Freq: Once | ORAL | Status: AC
  Administered 2012-10-24: 25 mg via ORAL
  Filled 2012-10-24: qty 1

## 2012-10-24 MED ORDER — CHLORDIAZEPOXIDE HCL 25 MG PO CAPS
25.0000 mg | ORAL_CAPSULE | ORAL | Status: AC
  Administered 2012-10-27 (×2): 25 mg via ORAL
  Filled 2012-10-24 (×2): qty 1

## 2012-10-24 MED ORDER — LITHIUM CARBONATE ER 300 MG PO TBCR
300.0000 mg | EXTENDED_RELEASE_TABLET | Freq: Two times a day (BID) | ORAL | Status: DC
  Administered 2012-10-24 – 2012-10-28 (×8): 300 mg via ORAL
  Filled 2012-10-24 (×11): qty 1

## 2012-10-24 MED ORDER — CHLORDIAZEPOXIDE HCL 25 MG PO CAPS
25.0000 mg | ORAL_CAPSULE | Freq: Every day | ORAL | Status: AC
  Administered 2012-10-28: 25 mg via ORAL
  Filled 2012-10-24: qty 1

## 2012-10-24 MED ORDER — CHLORDIAZEPOXIDE HCL 25 MG PO CAPS
25.0000 mg | ORAL_CAPSULE | Freq: Three times a day (TID) | ORAL | Status: AC
  Administered 2012-10-26 (×3): 25 mg via ORAL
  Filled 2012-10-24 (×2): qty 1

## 2012-10-24 MED ORDER — CHLORDIAZEPOXIDE HCL 25 MG PO CAPS
25.0000 mg | ORAL_CAPSULE | Freq: Four times a day (QID) | ORAL | Status: AC | PRN
  Administered 2012-10-24 – 2012-10-25 (×2): 25 mg via ORAL
  Filled 2012-10-24: qty 1
  Filled 2012-10-24: qty 2
  Filled 2012-10-24: qty 1

## 2012-10-24 MED ORDER — IBUPROFEN 600 MG PO TABS
600.0000 mg | ORAL_TABLET | Freq: Four times a day (QID) | ORAL | Status: DC | PRN
  Administered 2012-10-24 – 2012-10-26 (×3): 600 mg via ORAL
  Filled 2012-10-24 (×3): qty 1

## 2012-10-24 MED ORDER — LOPERAMIDE HCL 2 MG PO CAPS: 2.0000 mg | ORAL_CAPSULE | ORAL | Status: AC | PRN

## 2012-10-24 MED ORDER — CHLORDIAZEPOXIDE HCL 25 MG PO CAPS
25.0000 mg | ORAL_CAPSULE | Freq: Four times a day (QID) | ORAL | Status: AC
  Administered 2012-10-24 – 2012-10-25 (×6): 25 mg via ORAL
  Filled 2012-10-24 (×6): qty 1

## 2012-10-24 MED ORDER — ONDANSETRON 4 MG PO TBDP: 4.0000 mg | ORAL_TABLET | Freq: Four times a day (QID) | ORAL | Status: AC | PRN

## 2012-10-24 NOTE — [facility] Suicide Risk Assessment (Signed)
Suicide Risk Assessment  Admission Assessment     Nursing information obtained from:  Patient Demographic factors:  Male;Divorced or widowed;Caucasian;Low socioeconomic status;Unemployed Current Mental Status:  NA Loss Factors:  Decrease in vocational status;Loss of significant relationship;Decline in physical health;Legal issues;Financial problems / change in socioeconomic status Historical Factors:  Prior suicide attempts;Impulsivity Risk Reduction Factors:  Sense of responsibility to family  CLINICAL FACTORS:   Depression:   Anhedonia Severe Alcohol/Substance Abuse/Dependencies  COGNITIVE FEATURES THAT CONTRIBUTE TO RISK:  Polarized thinking Thought constriction (tunnel vision)    SUICIDE RISK:   Severe:  Frequent, intense, and enduring suicidal ideation, specific plan, no subjective intent, but some objective markers of intent (i.e., choice of lethal method), the method is accessible, some limited preparatory behavior, evidence of impaired self-control, severe dysphoria/symptomatology, multiple risk factors present, and few if any protective factors, particularly a lack of social support.  PLAN OF CARE: AXIS I:   Substance/Addictive Disorders:  Alcohol Related Disorder - Severe (303.90) Depressive Disorders:  Major Depressive Disorder - Severe (296.23) AXIS II:  Cluster B Traits AXIS III:   Past Medical History  Diagnosis Date  . Hepatitis C   . Chronic back pain   . A-fib   . Depression   . Anxiety   . Hepatitis C     history of  . Chronic back pain   . Acid reflux   . History of urinary frequency   . Peripheral neuropathy     hands and feet  . Hypertension   . Polysubstance abuse 01/25/2011  . Alcohol abuse   . Seizures    AXIS IV:  economic problems and other psychosocial or environmental problems AXIS V:  21-30 behavior considerably influenced by delusions or hallucinations OR serious impairment in judgment, communication OR inability to function in almost all  areas  Treatment Plan/Recommendations:  None  Treatment Plan Summary: Daily contact with patient to assess and evaluate symptoms and progress in treatment Medication management Current Medications:  Current Facility-Administered Medications  Medication Dose Route Frequency Provider Last Rate Last Dose  . chlordiazePOXIDE (LIBRIUM) capsule 25 mg  25 mg Oral Q6H PRN Court Joy, PA-C      . chlordiazePOXIDE (LIBRIUM) capsule 25 mg  25 mg Oral QID Court Joy, PA-C   25 mg at 10/24/12 1630   Followed by  . [START ON 10/26/2012] chlordiazePOXIDE (LIBRIUM) capsule 25 mg  25 mg Oral TID Court Joy, PA-C       Followed by  . [START ON 10/27/2012] chlordiazePOXIDE (LIBRIUM) capsule 25 mg  25 mg Oral BH-qamhs Court Joy, PA-C       Followed by  . [START ON 10/28/2012] chlordiazePOXIDE (LIBRIUM) capsule 25 mg  25 mg Oral Daily Court Joy, PA-C      . hydrOXYzine (ATARAX/VISTARIL) tablet 25 mg  25 mg Oral Q6H PRN Court Joy, PA-C      . loperamide (IMODIUM) capsule 2-4 mg  2-4 mg Oral PRN Court Joy, PA-C      . ondansetron (ZOFRAN-ODT) disintegrating tablet 4 mg  4 mg Oral Q6H PRN Court Joy, PA-C        Observation Level/Precautions:  15 minute checks  Laboratory:  Reviewed.  Psychotherapy: Patient to attend group and AA meeting.  Medications:  Start Lithium 300 mg.  Consultations:  None  Discharge Concerns:  Relapse potential  Estimated LOS: 5-7 days.  Other:  Option for referral to long term treatment.     I certify  that inpatient services furnished can reasonably be expected to improve the patient's condition.  Wanna Gully 10/24/2012, 7:02 PM

## 2012-10-24 NOTE — [facility] Group Notes (Signed)
[facility] Group Notes:  (Nursing/MHT/Case Management/Adjunct) Type of Therapy: Psychoeducational Skills  Participation Level: Did Not Attend  Participation Quality: na  Affect: na  Cognitive: na  Insight: None  Engagement in Group: na  Modes of Intervention: na  Summary of Progress/Problems:  Blake Arnold  10/24/2012, 4:04 PM   Blake Arnold 10/24/2012, 4:06 PM

## 2012-10-24 NOTE — ED Provider Notes (Signed)
Pt has been accepted to [facility].  Rolan Bucco, MD 10/24/12 [phone]

## 2012-10-24 NOTE — Progress Notes (Signed)
Patient ID: Blake Arnold, male   DOB: [birth year], 42 y.o.   MRN: [id number]    [name] Admit note  This is a voluntary admission for this caucasian 42 yr old gentleman who comes to [facility] from [facility] ED,, where he presented Thursday, 10/22/12, requesting assisstance with his detox from alcohol ( he reports relapsing 2 wks ago ). He says he was here last May, 2014 ( for same thing) , that he has depression, most recently been dealing with fleating thoughts of suicidal ideationn, suffers ffrom atrial fib, HTN, peripheral neuropathy, hepatitis C  And chronic back and knee pain. He is homeless, jobless and says he is stressed about where he ws living prior to admission ( with his mother) because his alcoholic brother ( and the brother's children) were living there also and that this is why he relapsed.[name] He reports his last alcohol intake was " early" Friday morning, states he has had withdrawal seizures " in the past" and is hoping " this will work this time". After his admission is complete, orders are obtained and pt is oriented to unit. He denies active SI and contracts with this wirter for safety.

## 2012-10-24 NOTE — Tx Team (Signed)
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 10/24/12     Atrial Fibrillation 10/24/12     Peripheral Neuropathy 10/24/12     Chronic Back Pain 10/24/12     Hepatitis C 10/24/12                        DISCHARGE CRITERIA:  Ability to meet basic life and health needs Adequate post-discharge living arrangements Improved stabilization in mood, thinking, and/or behavior Medical problems require only outpatient monitoring  PRELIMINARY DISCHARGE PLAN: Attend aftercare/continuing care group Attend PHP/IOP Attend 12-step recovery group Outpatient therapy Participate in family therapy  PATIENT/FAMIILY INVOLVEMENT: This treatment plan has been presented to and reviewed with the patient, Blake Arnold, and/or family member, .  The patient and family have been given the opportunity to ask questions and make suggestions.  Rich Brave 10/24/2012, 12:10 PM

## 2012-10-24 NOTE — ED Notes (Signed)
Call received from [facility]. Pt. Has been accepted at [facility] pending a bed.

## 2012-10-24 NOTE — [facility] Group Notes (Addendum)
      [facility] Group Notes: (Clinical Social Work)   10/24/2012      Type of Therapy:  Group Therapy   Participation Level:  Did Not Attend    Ambrose Mantle, LCSW 10/24/2012, 5:32 PM
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
## 2012-10-24 NOTE — Progress Notes (Signed)
[facility] Group Notes:  (Nursing/MHT/Case Management/Adjunct)  Date:  10/24/2012  Time:  2000  Type of Therapy:  Psychoeducational Skills  Participation Level:  Minimal  Participation Quality:  Attentive  Affect:  Depressed  Cognitive:  Oriented  Insight:  Lacking  Engagement in Group:  Limited  Modes of Intervention:  Education  Summary of Progress/Problems: The patient shared with the group that he slept for much of the day. His goal for tomorrow is to see if the medication is effective.   [name] S 10/24/2012, 11:36 PM

## 2012-10-24 NOTE — Progress Notes (Signed)
Patient's nurse notified of transfer to [facility], voluntary paper work signed and faxed to TTS.  Tomi Bamberger, MHT

## 2012-10-25 DIAGNOSIS — F332 Major depressive disorder, recurrent severe without psychotic features: Secondary | ICD-10-CM

## 2012-10-25 DIAGNOSIS — F1010 Alcohol abuse, uncomplicated: Secondary | ICD-10-CM

## 2012-10-25 MED ORDER — FLUOXETINE HCL 10 MG PO CAPS
10.0000 mg | ORAL_CAPSULE | Freq: Every day | ORAL | Status: DC
  Administered 2012-10-25 – 2012-10-28 (×4): 10 mg via ORAL
  Filled 2012-10-25 (×5): qty 1

## 2012-10-25 NOTE — Progress Notes (Signed)
[facility] MD Progress Note  10/25/2012 4:30 PM Blake Arnold  MRN:  [id number] Subjective:  Patient states "I am very anxious and depressed today. They are a seven. I had some more people move in with my mother and they are stressing me out. I relapsed again and had been clean since May of this year. I need a new place. I have filed for Disability. I still have thoughts of wanting to hurt myself. I can't take the stress and my mother doesn't care. She will not do anything about it."  Objective:  Patient appears very anxious. He appears to be ruminating about his living situation. He remains at risk for self harm due to numerous stressors.   Diagnosis:   DSM5:  Axis I: Alcohol Abuse and Major Depression, Recurrent severe Axis II: Cluster B Traits Axis III:  Past Medical History  Diagnosis Date  . Hepatitis C   . Chronic back pain   . A-fib   . Depression   . Anxiety   . Hepatitis C     history of  . Chronic back pain   . Acid reflux   . History of urinary frequency   . Peripheral neuropathy     hands and feet  . Hypertension   . Polysubstance abuse 01/25/2011  . Alcohol abuse   . Seizures    Axis IV: economic problems and other psychosocial or environmental problems Axis V: 41-50 serious symptoms  ADL's:  Intact  Sleep: Fair  Appetite:  Fair  Suicidal Ideation:  Denies Homicidal Ideation:  Denies AEB (as evidenced by):  Psychiatric Specialty Exam: Review of Systems  Constitutional: Negative.   HENT: Negative.   Eyes: Negative.   Respiratory: Negative.   Cardiovascular: Negative.   Gastrointestinal: Negative.   Genitourinary: Negative.   Musculoskeletal: Negative.   Skin: Negative.   Neurological: Negative.   Endo/Heme/Allergies: Negative.   Psychiatric/Behavioral: Positive for depression, suicidal ideas and substance abuse. Negative for hallucinations and memory loss. The patient is nervous/anxious and has insomnia.     Blood pressure 134/84, pulse 70,  temperature 97.8 F (36.6 C), temperature source Oral, resp. rate 16, height 5\' 6"  (1.676 m), weight 87 kg (191 lb 12.8 oz), SpO2 98.00%.Body mass index is 30.97 kg/(m^2).  General Appearance: Casual  Eye Contact::  Good  Speech:  Clear and Coherent  Volume:  Normal  Mood:  Anxious and Depressed  Affect:  Flat  Thought Process:  Goal Directed  Orientation:  Full (Time, Place, and Person)  Thought Content:  Rumination  Suicidal Thoughts:  Yes.  with intent/plan  Homicidal Thoughts:  No  Memory:  Immediate;   Good Recent;   Good Remote;   Good  Judgement:  Fair  Insight:  Fair  Psychomotor Activity:  Restlessness  Concentration:  Fair  Recall:  Fair  Akathisia:  No  Handed:  Right  AIMS (if indicated):     Assets:  Communication Skills Desire for Improvement Leisure Time Physical Health Resilience  Sleep:  Number of Hours: 6.5   Current Medications: Current Facility-Administered Medications  Medication Dose Route Frequency Provider Last Rate Last Dose  . chlordiazePOXIDE (LIBRIUM) capsule 25 mg  25 mg Oral Q6H PRN Court Joy, PA-C   25 mg at 10/25/12 0810  . chlordiazePOXIDE (LIBRIUM) capsule 25 mg  25 mg Oral QID Court Joy, PA-C   25 mg at 10/25/12 1310   Followed by  . [START ON 10/26/2012] chlordiazePOXIDE (LIBRIUM) capsule 25 mg  25 mg Oral  TID Court Joy, PA-C       Followed by  . [START ON 10/27/2012] chlordiazePOXIDE (LIBRIUM) capsule 25 mg  25 mg Oral BH-qamhs Court Joy, PA-C       Followed by  . [START ON 10/28/2012] chlordiazePOXIDE (LIBRIUM) capsule 25 mg  25 mg Oral Daily Court Joy, PA-C      . hydrOXYzine (ATARAX/VISTARIL) tablet 25 mg  25 mg Oral Q6H PRN Court Joy, PA-C   25 mg at 10/25/12 0009  . ibuprofen (ADVIL,MOTRIN) tablet 600 mg  600 mg Oral Q6H PRN Larena Sox, MD   600 mg at 10/24/12 2000  . lithium carbonate (LITHOBID) CR tablet 300 mg  300 mg Oral Q12H Larena Sox, MD   300 mg at 10/25/12 0811  . loperamide  (IMODIUM) capsule 2-4 mg  2-4 mg Oral PRN Court Joy, PA-C      . ondansetron (ZOFRAN-ODT) disintegrating tablet 4 mg  4 mg Oral Q6H PRN Court Joy, PA-C        Lab Results: No results found for this or any previous visit (from the past 48 hour(s)).  Physical Findings: AIMS: Facial and Oral Movements Muscles of Facial Expression: None, normal Lips and Perioral Area: None, normal Jaw: None, normal Tongue: None, normal,Extremity Movements Upper (arms, wrists, hands, fingers): None, normal Lower (legs, knees, ankles, toes): None, normal, Trunk Movements Neck, shoulders, hips: None, normal, Overall Severity Severity of abnormal movements (highest score from questions above): None, normal Incapacitation due to abnormal movements: None, normal Patient's awareness of abnormal movements (rate only patient's report): No Awareness, Dental Status Current problems with teeth and/or dentures?: No Does patient usually wear dentures?: No  CIWA:  CIWA-Ar Total: 5 COWS:  COWS Total Score: 6  Treatment Plan Summary: Review of medication regiment & side effects (2)  Plan: Continue crisis management and stabilization.  Medication management: Reviewed with patient who stated no untoward effects. Continue librium detox protocol for ETOH detox. Continue Lithium 300 mg every twelve hours for improved mood stability. Start Prozac 10 mg daily for depressive symptoms.  Encouraged patient to attend groups and participate in group counseling sessions and activities.  Discharge plan in progress.  Continue current treatment plan.  Address health issues: Vitals reviewed and stable.   Medical Decision Making Problem Points:  Established problem, stable/improving (1) and Review of psycho-social stressors (1) Data Points:  Review of medication regiment & side effects (2)  I certify that inpatient services furnished can reasonably be expected to improve the patient's condition.   DAVIS, LAURA  NP-C 10/25/2012, 4:30 PM  Reviewed note, agree with findings and plan.  Jacqulyn Cane, M.D.  10/26/2012 6:22 AM

## 2012-10-25 NOTE — Progress Notes (Signed)
Patient ID: Blake Arnold, male   DOB: 1970/04/23, 42 y.o.   MRN: [id number] D. Pt presents with depressed, anxious mood , affect blunted. Patient states '' Man I'm having a lot of withdrawal symptoms, last night and this morning I was drenched in sweat, to where my clothes were soaked. I still feel shaky and light headed and pretty bad today. I want to just rest '' Patient CIWA assessed, prn medication given as indicated (see eMAR). Patient continues to endorse feeling depressed, completed self inventory and rated depression 8/10 on depression scale, 10 being worst depression, 1 being least. Patient also endorses vague fleeting suicidal thoughts but is able to contract for safety. A. Medications given as ordered. Support and encouragement provided. Patient encouraged to drink fluids, gatorade provided. R. Patient denies any further acute concerns at this time. Patient has been resting quietly in bed throughout most of shift. Will continue to monitor q 15 minutes for safety.

## 2012-10-25 NOTE — Progress Notes (Signed)
Writer observed patient lying in bed resting, reports that he has been sleeping quite a bit today. Writer informed patient of medication due and he came to window to take his lithium. Patient given snacks, pitcher of gatorade along with his med and he attended group and reported that he was going to see how his new medication was going to work for him. Support and encouragement offered, safety maintained on unit, will continue to monitor.

## 2012-10-25 NOTE — Progress Notes (Signed)
[facility] Group Notes:  (Nursing/MHT/Case Management/Adjunct)  Date:  10/25/2012  Time:  2000  Type of Therapy:  Psychoeducational Skills  Participation Level:  Minimal  Participation Quality:  Attentive  Affect:  Blunted  Cognitive:  Appropriate  Insight:  Improving  Engagement in Group:  Lacking  Modes of Intervention:  Education  Summary of Progress/Problems: The patient verbalized that he didn't have a good day since he slept for the majority of the day. He also mentioned that he is feeling somewhat better than yesterday. His goal for tomorrow is to try to get to know some of his peers.    [name] S 10/25/2012, 11:38 PM

## 2012-10-25 NOTE — Progress Notes (Signed)
Writer spoke with patient 1:1 and he reports that his day has been a little bit better. He c/o his left knee continuously hurting and he plans to see his doctor about this once discharged. Patient received ibuprofen for pain he rated a 5. Patient attended group this evening and reports that he does not care to be around a lot of people but attended group. Writer praised patient for staying out of his room as much as possible. Patient denies si/hi/a/v hallucinations. Safety maintained on unit, pt compliant with meds, will continue to monitor.

## 2012-10-25 NOTE — [facility] Group Notes (Signed)
[facility] Group Notes: (Clinical Social Work)   10/25/2012      Type of Therapy:  Group Therapy   Participation Level:  Did Not Attend    Ambrose Mantle, LCSW 10/25/2012, 4:35 PM

## 2012-10-25 NOTE — [facility] Counselor (Signed)
Adult Psychosocial Assessment Update Interdisciplinary Team  Previous [facility] admissions/discharges:  Admissions Discharges  Date:04/02/2012 Date:04/07/2012  Date:04/19/2011 Date:04/23/2011  Date: Date:  Date: Date:  Date: Date:   Changes since the last Psychosocial Assessment (including adherence to outpatient mental health and/or substance abuse treatment, situational issues contributing to decompensation and/or relapse). Pt has been following up with [facility] [place] for medication management.  He reports that prior to relapse two weeks ago he has been sober from ETOH since last admission in April.  Pt shares that he can not identify any factors that contributed to his relapse though he admits to continuing to associate and spend time with people that drink.              Discharge Plan 1. Will you be returning to the same living situation after discharge?   Yes:X No:      If no, what is your plan?    Pt lives with his mother and will be returning there following DC.       2. Would you like a referral for services when you are discharged? Yes: X    If yes, for what services?  No:       Therapy and Medication Management with [facility].       Summary and Recommendations (to be completed by the evaluator) Blake Arnold is an 42 y.o. male that was assessed this day via tele assessment after presenting to [facility] reporting SI with plans to hang or shoot self. Pt stated he came to the ED because of SI as well as because he relapsed 2 weeks ago and has been drinking 3 40 oz beers per day, last drink was this morning and pt had one 40 oz beer. Pt stated he also used i gram of cocaine yesterday, and that he only occasionally uses that. Pt stated he has been sober since the end of May of this year, but continues to struggle with his living situation (he lives with his mother), having no work, and having conflict with his ex-wife.   Pt will benefit from  crisis stabilization, medication management, psychoeducation, group therapy, and aftercare planning.  It is anticipated that pt will have increased coping skills and decreased depressive symptoms.                      Signature:  Foye Clock, 10/25/2012 5:10 PM

## 2012-10-26 NOTE — Progress Notes (Signed)
Patient ID: Blake Arnold, male   DOB: 03/29/1970, 42 y.o.   MRN: [id number] D: Pt. Lying in bed, reports "tired from medicine". Pt. Reports depression at "6" of 10. Pt. Up to get medicine. Pt. Denies SI. A: Writer introduced self to client and reviewed med administration times. Writer encouraged pt. To attend group. Staff will monitor q31min for safety. R: Pt. Is safe on the unit and attended group.

## 2012-10-26 NOTE — [facility] Group Notes (Signed)
[facility] LCSW Aftercare Discharge Planning Group Note   10/26/2012 8:45 AM  Participation Quality:  Alert and Appropriate   Mood/Affect:  Appropriate, Flat and Depressed  Depression Rating:  5  Anxiety Rating:  5  Thoughts of Suicide:  Pt denies SI/HI  Will you contract for safety?   Yes  Current AVH:  Pt denies   Plan for Discharge/Comments:  Pt attended discharge planning group and actively participated in group.  CSW provided pt with today's workbook.  Pt reports coming to the hospital for depression and alcohol abuse.  Pt states that he feels better today.  Pt states that he lives in [place].  Pt states that he is followed by [facility] [place] for medication management and therapy.  CSW will refer pt back there.  No further needs voiced by pt at this time.    Transportation Means:  Pt reports access to transportation - pt reports family will pick pt up  Supports: No supports mentioned  Blake Arnold, LCSWA 10/26/2012 9:55 AM

## 2012-10-26 NOTE — Tx Team (Signed)
Interdisciplinary Treatment Plan Update (Adult)  Date: 10/26/2012  Time Reviewed:  9:45 AM  Progress in Treatment: Attending groups: Yes Participating in groups:  Yes Taking medication as prescribed:  Yes Tolerating medication:  Yes Family/Significant othe contact made: CSW assessing  Patient understands diagnosis:  Yes Discussing patient identified problems/goals with staff:  Yes Medical problems stabilized or resolved:  Yes Denies suicidal/homicidal ideation: Yes Issues/concerns per patient self-inventory:  Yes Other:  New problem(s) identified: N/A  Discharge Plan or Barriers: CSW assessing for appropriate referrals.  Reason for Continuation of Hospitalization: Anxiety Depression Medication Stabilization  Comments: N/A  Estimated length of stay: 3-5 days  For review of initial/current patient goals, please see plan of care.  Attendees: Patient:     Family:     Physician:  Dr. Johnalagadda 10/26/2012 12:40 PM   Nursing:   Carol Davis, RN 10/26/2012 12:40 PM   Clinical Social Worker:  Johnica Armwood Horton, LCSWA 10/26/2012 12:40 PM   Other: Neil Mashburn, PA 10/26/2012 12:40 PM   Other:   Other:    Other:     Other:    Other:    Other:    Other:    Other:    Other:     Scribe for Treatment Team:   Horton, Porscha Axley Nicole, 10/26/2012 12:40 PM   

## 2012-10-26 NOTE — Progress Notes (Signed)
Recreation Therapy Notes  Date: 09.15.2014 Time: 3:00pm Location: [address] Dayroom  Group Topic: Coping Skills  Goal Area(s) Addresses:  Patient will verbalize importance of recognizing emotions. Patient will identify at least one emotion. Patient will successfully represent varying emotions in pictures or words.   Behavioral Response: Appropriate, Attentive, Engaged  Intervention: Art  Activity: Emotion Wheel. As a group patients identified 8 emotions. Using the provided worksheet patients were asked to represent emotions identified by group in pictures of words.    Education: Emotional Recognition, Emotional Regulation, Coping Skills  Education Outcome: Acknowledges Understanding  Clinical Observations/Feedback: Patient contributed to group discussion, identifying emotions with group members. Patient actively participated in depicting identified emotions on his worksheet. Patient participated in group discussion, discussing the importance of being able to identify specific emotions, as well as identifying a coping mechanism he can use when needed. Patient verbalized understanding of relationships of emotions on worksheet to each other, as well as emotional balance as it contributes to whole wellness.   Marykay Lex Hillarie Harrigan, LRT/CTRS  Maurice Fotheringham L 10/26/2012 5:03 PM

## 2012-10-26 NOTE — Progress Notes (Signed)
Patient ID: Blake Arnold, male   DOB: 12/02/1970, 42 y.o.   MRN: [id number] D-Patient reports he slept well and that his appetite id improving.  He denies any detox symptoms.  He rates his depression at 5/10.  Says he wants to go back to school.  A- Supported patient.  R- Patient has been attending groups.

## 2012-10-26 NOTE — [facility] Group Notes (Signed)
[facility] LCSW Group Therapy  10/26/2012  1:15 PM   Type of Therapy:  Group Therapy  Participation Level:  Active  Participation Quality:  Appropriate and Attentive  Affect:  Appropriate and Calm  Cognitive:  Alert and Appropriate  Insight:  Developing/Improving and Engaged  Engagement in Therapy:  Developing/Improving and Engaged  Modes of Intervention:  Clarification, Confrontation, Discussion, Education, Exploration, Limit-setting, Orientation, Problem-solving, Rapport Building, Dance movement psychotherapist, Socialization and Support  Summary of Progress/Problems: Pt identified obstacles faced currently and processed barriers involved in overcoming these obstacles. Pt identified steps necessary for overcoming these obstacles and explored motivation (internal and external) for facing these difficulties head on. Pt further identified one area of concern in their lives and chose a goal to focus on for today.   Pt shared that his biggest obstacle is accepting his declining health, and how this impacts his ability to do things, his mood and substance use.  Pt states that he feels worthless because he is unable to do the things he used to do, such as certain hobbies (hiking, outdoors).  Pt states that he knows his environment is also negative, as he reports it encourages him to use.  Pt states that he plans to have the willpower to not use even though it is going on around him.  Pt states that he plans to surround himself with positive people.  Pt actively participated and was engaged in group discussion.    Reyes Ivan, LCSWA 10/26/2012 2:42 PM

## 2012-10-26 NOTE — Progress Notes (Signed)
Adult Psychoeducational Group Note  Date:  10/26/2012 Time:  10:33 PM  Group Topic/Focus:  Goals Group:   The focus of this group is to help patients establish daily goals to achieve during treatment and discuss how the patient can incorporate goal setting into their daily lives to aide in recovery.  Participation Level:  Active  Participation Quality:  Appropriate  Affect:  Appropriate  Cognitive:  Appropriate  Insight: Appropriate  Engagement in Group:  Engaged  Modes of Intervention:  Discussion  Additional Comments:  Pt stated that day is ok but is concentrating on leaving.  Terie Purser R 10/26/2012, 10:33 PM

## 2012-10-26 NOTE — Progress Notes (Signed)
Patient ID: Blake Arnold, male   DOB: [birth year], 42 y.o.   MRN: [id number] [facility] MD Progress Note  10/26/2012 11:15 AM Blake Arnold  MRN:  [id number] Subjective:  Patient has been compliant with his medications. He has no reported side effects. he stated that he is taking prozac for depression and lithium of mood swings. He is able to get out of his bed this morning and able to attend group meetings. He has rated depression 5/10 and anxiety 5/10. He endorses being alcoholic and abusing cocaine and sober about four months and than relapsed.  Reportedly her nieces and their BF moved into his mom's home and constantly argumentative which led to relapse of alcoholism and drug abuse. He says he hopes people leave his mom's house and he can stay away from his stresses. He has charges for B&E and went to jail and other charge in the past. He will be going back to his mom's home on discharge and [facility].    Diagnosis:   DSM5:  Axis I: Alcohol Abuse and Major Depression, Recurrent severe Axis II: Cluster B Traits Axis III:  Past Medical History  Diagnosis Date  . Hepatitis C   . Chronic back pain   . A-fib   . Depression   . Anxiety   . Hepatitis C     history of  . Chronic back pain   . Acid reflux   . History of urinary frequency   . Peripheral neuropathy     hands and feet  . Hypertension   . Polysubstance abuse 01/25/2011  . Alcohol abuse   . Seizures    Axis IV: economic problems and other psychosocial or environmental problems Axis V: 41-50 serious symptoms  ADL's:  Intact  Sleep: Fair  Appetite:  Fair  Suicidal Ideation:  Denies Homicidal Ideation:  Denies AEB (as evidenced by):  Psychiatric Specialty Exam: Review of Systems  Constitutional: Negative.   HENT: Negative.   Eyes: Negative.   Respiratory: Negative.   Cardiovascular: Negative.   Gastrointestinal: Negative.   Genitourinary: Negative.   Musculoskeletal: Negative.    Skin: Negative.   Neurological: Negative.   Endo/Heme/Allergies: Negative.   Psychiatric/Behavioral: Positive for depression, suicidal ideas and substance abuse. Negative for hallucinations and memory loss. The patient is nervous/anxious and has insomnia.     Blood pressure 120/83, pulse 67, temperature 98 F (36.7 C), temperature source Oral, resp. rate 18, height 5\' 6"  (1.676 m), weight 87 kg (191 lb 12.8 oz), SpO2 98.00%.Body mass index is 30.97 kg/(m^2).  General Appearance: Casual  Eye Contact::  Good  Speech:  Clear and Coherent  Volume:  Normal  Mood:  Anxious and Depressed  Affect:  Flat  Thought Process:  Goal Directed  Orientation:  Full (Time, Place, and Person)  Thought Content:  Rumination  Suicidal Thoughts:  Yes.  with intent/plan  Homicidal Thoughts:  No  Memory:  Immediate;   Good Recent;   Good Remote;   Good  Judgement:  Fair  Insight:  Fair  Psychomotor Activity:  Restlessness  Concentration:  Fair  Recall:  Fair  Akathisia:  No  Handed:  Right  AIMS (if indicated):     Assets:  Communication Skills Desire for Improvement Leisure Time Physical Health Resilience  Sleep:  Number of Hours: 6.75   Current Medications: Current Facility-Administered Medications  Medication Dose Route Frequency Provider Last Rate Last Dose  . chlordiazePOXIDE (LIBRIUM) capsule 25 mg  25 mg Oral  Q6H PRN Court Joy, PA-C   25 mg at 10/25/12 0810  . chlordiazePOXIDE (LIBRIUM) capsule 25 mg  25 mg Oral TID Court Joy, PA-C   25 mg at 10/26/12 0810   Followed by  . [START ON 10/27/2012] chlordiazePOXIDE (LIBRIUM) capsule 25 mg  25 mg Oral BH-qamhs Court Joy, PA-C       Followed by  . [START ON 10/28/2012] chlordiazePOXIDE (LIBRIUM) capsule 25 mg  25 mg Oral Daily Court Joy, PA-C      . FLUoxetine (PROZAC) capsule 10 mg  10 mg Oral Daily Fransisca Kaufmann, NP   10 mg at 10/26/12 0809  . hydrOXYzine (ATARAX/VISTARIL) tablet 25 mg  25 mg Oral Q6H PRN Court Joy,  PA-C   25 mg at 10/25/12 0009  . ibuprofen (ADVIL,MOTRIN) tablet 600 mg  600 mg Oral Q6H PRN Larena Sox, MD   600 mg at 10/25/12 2030  . lithium carbonate (LITHOBID) CR tablet 300 mg  300 mg Oral Q12H Larena Sox, MD   300 mg at 10/26/12 0810  . loperamide (IMODIUM) capsule 2-4 mg  2-4 mg Oral PRN Court Joy, PA-C      . ondansetron (ZOFRAN-ODT) disintegrating tablet 4 mg  4 mg Oral Q6H PRN Court Joy, PA-C        Lab Results: No results found for this or any previous visit (from the past 48 hour(s)).  Physical Findings: AIMS: Facial and Oral Movements Muscles of Facial Expression: None, normal Lips and Perioral Area: None, normal Jaw: None, normal Tongue: None, normal,Extremity Movements Upper (arms, wrists, hands, fingers): None, normal Lower (legs, knees, ankles, toes): None, normal, Trunk Movements Neck, shoulders, hips: None, normal, Overall Severity Severity of abnormal movements (highest score from questions above): None, normal Incapacitation due to abnormal movements: None, normal Patient's awareness of abnormal movements (rate only patient's report): No Awareness, Dental Status Current problems with teeth and/or dentures?: No Does patient usually wear dentures?: No  CIWA:  CIWA-Ar Total: 0 COWS:  COWS Total Score: 6  Treatment Plan Summary: Review of medication regiment & side effects (2)  Plan: Continue crisis management and stabilization.  Medication management: Reviewed with patient who stated no untoward effects.  Continue librium detox protocol for ETOH detox.  Continue Lithium 300 mg BID for improved mood stability and Prozac 10 mg daily for depression.  Encouraged patient to attend groups and participate in group counseling sessions and activities.  Discharge plan in progress.  Continue current treatment plan.  Address health issues: Vitals reviewed and stable.   Medical Decision Making Problem Points:  Established problem,  stable/improving (1) and Review of psycho-social stressors (1) Data Points:  Review of medication regiment & side effects (2)  I certify that inpatient services furnished can reasonably be expected to improve the patient's condition.   Mansel Strother,JANARDHAHA R.  10/26/2012, 11:15 AM

## 2012-10-27 NOTE — Progress Notes (Signed)
Patient ID: Blake Arnold, male   DOB: [birth year], 42 y.o.   MRN: [id number] D- Patient reports he slept well and that he feels ready to go home.  "I'm finished with detox and I'm ready to go.  Says his appetite is good and his energy level is normal.  He denies depression or thoughts of self harm.  A- Talked with patient about his goals and his recovery.  Says that he has relapsed multiple times but he has learned to seek out treatment to stop the relapsse from continuing or worsening.  A- Supported patient.  Patient getting impatient with waiting to find out about going home but  his behavior is appropriate.

## 2012-10-27 NOTE — [facility] Suicide Risk Assessment (Signed)
[facility] INPATIENT:  Family/Significant Other Suicide Prevention Education  Suicide Prevention Education:  Education Completed; Blake Arnold - mother [phone]),  (name of family member/significant other) has been identified by the patient as the family member/significant other with whom the patient will be residing, and identified as the person(s) who will aid the patient in the event of a mental health crisis (suicidal ideations/suicide attempt).  With written consent from the patient, the family member/significant other has been provided the following suicide prevention education, prior to the and/or following the discharge of the patient.  The suicide prevention education provided includes the following:  Suicide risk factors  Suicide prevention and interventions  National Suicide Hotline telephone number  [facility] assessment telephone number  [facility] Emergency Assistance 911  [facility] and/or Residential Mobile Crisis Unit telephone number  Request made of family/significant other to:  Remove weapons (e.g., guns, rifles, knives), all items previously/currently identified as safety concern.    Remove drugs/medications (over-the-counter, prescriptions, illicit drugs), all items previously/currently identified as a safety concern.  The family member/significant other verbalizes understanding of the suicide prevention education information provided.  The family member/significant other agrees to remove the items of safety concern listed above.  Blake Arnold 10/27/2012, 8:46 AM

## 2012-10-27 NOTE — Progress Notes (Signed)
Patient ID: Blake Arnold, male   DOB: [birth year], 42 y.o.   MRN: [id number] [facility] MD Progress Note  10/27/2012 3:32 PM Blake Arnold  MRN:  [id number] Subjective:  Blake Arnold is up and active on the unit milieu today. He states he is feeling better and is ready to leave. He reports his depression is much better and his anxiety is way down. His withdrawal symptoms are resolved and he would like to discharge home to his mother's home on Wednesday. He did endorse that he may have forgotten to take his depakote some before he relapsed, and that there are a lot of alcohol and drugs with his family, but he hopes to return to [facility] upon discharge.   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 Hepatitis C   . Chronic back pain   . A-fib   . Depression   . Anxiety   . Hepatitis C     history of  . Chronic back pain   . Acid reflux   . History of urinary frequency   . Peripheral neuropathy     hands and feet  . Hypertension   . Polysubstance abuse 01/25/2011  . Alcohol abuse   . Seizures    Axis IV: economic problems and other psychosocial or environmental problems Axis V: 41-50 serious symptoms  ADL's:  Intact  Sleep: "excellent!"  Appetite:  "never slowed down!"  Suicidal Ideation:  Denies Homicidal Ideation:  Denies AEB (as evidenced by):  Psychiatric Specialty Exam: Review of Systems  Constitutional: Negative.   HENT: Negative.   Eyes: Negative.   Respiratory: Negative.   Cardiovascular: Negative.   Gastrointestinal: Negative.   Genitourinary: Negative.   Musculoskeletal: Negative.   Skin: Negative.   Neurological: Negative.   Endo/Heme/Allergies: Negative.   Psychiatric/Behavioral: Positive for depression, suicidal ideas and substance abuse. Negative for hallucinations and memory loss. The patient is nervous/anxious and has  insomnia.     Blood pressure 130/86, pulse 60, temperature 97.5 F (36.4 C), temperature source Oral, resp. rate 16, height 5\' 6"  (1.676 m), weight 87 kg (191 lb 12.8 oz), SpO2 98.00%.Body mass index is 30.97 kg/(m^2).  General Appearance: Casual  Eye Contact::  Good  Speech:  Clear and Coherent  Volume:  Normal  Mood:  Anxious and Depressed  Affect:  Flat  Thought Process:  Goal Directed  Orientation:  Full (Time, Place, and Person)  Thought Content:  Linear and goal directed  Suicidal Thoughts:  Denies   Homicidal Thoughts:  No  Memory:  Immediate;   Good Recent;   Good Remote;   Good  Judgement:  Fair  Insight:  Fair  Psychomotor Activity:  Restlessness  Concentration:  Fair  Recall:  Fair  Akathisia:  No  Handed:  Right  AIMS (if indicated):     Assets:  Communication Skills Desire for Improvement Leisure Time Physical Health Resilience  Sleep:  Number of Hours: 6.25   Current Medications: Current Facility-Administered Medications  Medication Dose Route Frequency Provider Last Rate Last Dose  . chlordiazePOXIDE (LIBRIUM) capsule 25 mg  25 mg Oral BH-qamhs Court Joy, PA-C   25 mg at 10/27/12 9528   Followed by  . [START ON 10/28/2012] chlordiazePOXIDE (LIBRIUM) capsule 25 mg  25 mg Oral Daily Court Joy, PA-C      . FLUoxetine (PROZAC) capsule 10 mg  10 mg Oral Daily Fransisca Kaufmann, NP   10 mg at 10/27/12 0806  . ibuprofen (ADVIL,MOTRIN) tablet 600 mg  600 mg Oral Q6H PRN Larena Sox, MD   600 mg at 10/26/12 1701  . lithium carbonate (LITHOBID) CR tablet 300 mg  300 mg Oral Q12H Larena Sox, MD   300 mg at 10/27/12 4540    Lab Results: No results found for this or any previous visit (from the past 48 hour(s)).  Physical Findings: AIMS: Facial and Oral Movements Muscles of Facial Expression: None, normal Lips and Perioral Area: None, normal Jaw: None, normal Tongue: None, normal,Extremity Movements Upper (arms, wrists, hands, fingers): None,  normal Lower (legs, knees, ankles, toes): None, normal, Trunk Movements Neck, shoulders, hips: None, normal, Overall Severity Severity of abnormal movements (highest score from questions above): None, normal Incapacitation due to abnormal movements: None, normal Patient's awareness of abnormal movements (rate only patient's report): No Awareness, Dental Status Current problems with teeth and/or dentures?: No Does patient usually wear dentures?: No  CIWA:  CIWA-Ar Total: 0 COWS:  COWS Total Score: 6  Treatment Plan Summary: Review of medication regiment & side effects (2)  Plan: Continue crisis management and stabilization.  Medication management: Reviewed with patient who stated no untoward effects.  Continue librium detox protocol for ETOH detox.  Continue Lithium 300 mg BID for improved mood stability and Prozac 10 mg daily for depression.  Encouraged patient to attend groups and participate in group counseling sessions and activities.  Discharge plan in progress.  Continue current treatment plan.  Address health issues: Vitals reviewed and stable.  ELOS: D/c for tomorrow. Medical Decision Making Problem Points:  Established problem, stable/improving (1) and Review of psycho-social stressors (1) Data Points:  Review of medication regiment & side effects (2)  I certify that inpatient services furnished can reasonably be expected to improve the patient's condition.  Blake Arnold RPAC 3:38 PM 10/27/2012  Met with the patient personally, evaluated for suicidal risk assessment, case discussed with the treatment team and formulated the discharge plan.Reviewed the information documented and agree with the treatment plan.  Blake Arnold,JANARDHAHA R. 10/28/2012 12:59 PM

## 2012-10-27 NOTE — [facility] Group Notes (Signed)
[facility] LCSW Group Therapy      Feelings About Diagnosis 1:15 - 2:30 PM         10/27/2012 2:59 PM    Type of Therapy:  Group Therapy  Participation Level:  Minimal  Participation Quality:  Appropriate  Affect:  Appropriate  Cognitive:  Alert and Appropriate  Insight:  Developing/Improving   Engagement in Therapy:  Developing/Improving  Modes of Intervention:  Discussion, Education, Exploration, Problem-Solving, Rapport Building, Support  Summary of Progress/Problems:  Patient listened attentively and nodded in agreement to statements by peers but did not participate in discussion.  Wynn Banker 10/27/2012 2:59 PM

## 2012-10-27 NOTE — Progress Notes (Signed)
Adult Psychoeducational Group Note  Date:  10/27/2012 Time:  10:53 PM  Group Topic/Focus:  Goals Group:   The focus of this group is to help patients establish daily goals to achieve during treatment and discuss how the patient can incorporate goal setting into their daily lives to aide in recovery.  Participation Level:  Active  Participation Quality:  Appropriate  Affect:  Appropriate  Cognitive:  Appropriate  Insight: Appropriate  Engagement in Group:  Engaged  Modes of Intervention:  Discussion  Additional Comments:  Pt. Stated that she had a good day overall.  Terie Purser R 10/27/2012, 10:53 PM

## 2012-10-27 NOTE — Progress Notes (Signed)
The focus of this group is to educate the patient on the purpose and policies of crisis stabilization and provide a format to answer questions about their admission.  The group details unit policies and expectations of patients while admitted. Patient set a short term goal of maintaining his sobriety by doing what he needs to to avoid triggers.  His long term goal is to work with other addicts to maintain sobriety.  He was engaged and gave others feedback.

## 2012-10-27 NOTE — Progress Notes (Signed)
Adult Psychoeducational Group Note  Date:  10/27/2012 Time:  11:00am Group Topic/Focus:  Recovery Goals:   The focus of this group is to identify appropriate goals for recovery and establish a plan to achieve them.  Participation Level:  Active  Participation Quality:  Appropriate and Attentive  Affect:  Appropriate  Cognitive:  Alert and Appropriate  Insight: Appropriate and Good  Engagement in Group:  Engaged  Modes of Intervention:  Discussion and Education  Additional Comments:  Pt attended and participated in group. When ask what recovery meant to him pt stated  Reforming my life to something different and not to use alcohol and drugs anymore.  Shelly Bombard D 10/27/2012, 1:36 PM

## 2012-10-27 NOTE — Progress Notes (Signed)
Patient ID: Blake Arnold, male   DOB: May 07, 1970, 42 y.o.   MRN: [id number]  D: Pt denies SI/HI/AVH. Pt is pleasant and cooperative.  A: Pt was offered support and encouragement. Pt was given scheduled medications. Pt was encourage to attend groups. Q 15 minute checks were done for safety.   R:Pt  interacts well with peers and staff. Pt is taking medication. Pt has no complaints at this time.Pt receptive to treatment and safety maintained on unit.

## 2012-10-27 NOTE — Progress Notes (Signed)
Recreation Therapy Notes  Date: 09.16.2014 Time: 2:45pm Location: [address] Dayroom  Group Topic: Software engineer Activities (AAA)  Behavioral Response: Engaged, Appropriate  Affect: Euthymic  Clinical Observations/Feedback: Dog Team: [employer] Patient interacted appropriately with peer, dog team, LRT and MHT.   Marykay Lex Teegan Guinther, LRT/CTRS  Jearl Klinefelter 10/27/2012 4:49 PM

## 2012-10-28 DIAGNOSIS — F319 Bipolar disorder, unspecified: Secondary | ICD-10-CM | POA: Diagnosis present

## 2012-10-28 MED ORDER — DICLOFENAC SODIUM 50 MG PO TBEC: 50.0000 mg | DELAYED_RELEASE_TABLET | Freq: Three times a day (TID) | ORAL | Status: DC

## 2012-10-28 MED ORDER — FLUOXETINE HCL 10 MG PO CAPS: 10.0000 mg | ORAL_CAPSULE | Freq: Every day | ORAL | Status: DC

## 2012-10-28 MED ORDER — LITHIUM CARBONATE ER 300 MG PO TBCR: 300.0000 mg | EXTENDED_RELEASE_TABLET | Freq: Two times a day (BID) | ORAL | Status: DC

## 2012-10-28 NOTE — Progress Notes (Signed)
[facility] Adult Case Management Discharge Plan :  Will you be returning to the same living situation after discharge: Yes,  returning home At discharge, do you have transportation home?:Yes,  provided pt with a bus pass Do you have the ability to pay for your medications:Yes,  access to meds  Release of information consent forms completed and in the chart;  Patient's signature needed at discharge.  Patient to Follow up at: Follow-up Information   Follow up with [facility] [place] On 10/30/2012. (Walk in on this date for hospital discharge appointment, for medication management and therapy.  Walk in clinic Monday - Friday 8 am - 3 pm. )    Contact information:   [address], [place] [address] Phone: [phone] Fax: [phone]      Patient denies SI/HI:   Yes,  denies SI/HI    Safety Planning and Suicide Prevention discussed:  Yes,  discussed with pt and pt's mother.  See suicide prevention education note.   Carmina Miller 10/28/2012, 10:26 AM

## 2012-10-28 NOTE — BHH Suicide Risk Assessment (Signed)
Suicide Risk Assessment  Discharge Assessment     Demographic Factors:  Male, Adolescent or young adult, Caucasian, Low socioeconomic status and Unemployed  Mental Status Per Nursing Assessment::   On Admission:  NA  Current Mental Status by Physician: NA  Loss Factors: Financial problems/change in socioeconomic status  Historical Factors: Impulsivity  Risk Reduction Factors:   Sense of responsibility to family, Religious beliefs about death, Living with another person, especially a relative, Positive social support, Positive therapeutic relationship and Positive coping skills or problem solving skills  Continued Clinical Symptoms:  Bipolar Disorder:   Mixed State Depression:   Impulsivity Recent sense of peace/wellbeing Alcohol/Substance Abuse/Dependencies Unstable or Poor Therapeutic Relationship Previous Psychiatric Diagnoses and Treatments  Cognitive Features That Contribute To Risk:  Polarized thinking    Suicide Risk:  Minimal: No identifiable suicidal ideation.  Patients presenting with no risk factors but with morbid ruminations; may be classified as minimal risk based on the severity of the depressive symptoms  Discharge Diagnoses:   AXIS I:  Bipolar, Depressed, Substance Induced Mood Disorder and Polysubstance abuse AXIS II:  Deferred AXIS III:   Past Medical History  Diagnosis Date  . Hepatitis C   . Chronic back pain   . A-fib   . Depression   . Anxiety   . Hepatitis C     history of  . Chronic back pain   . Acid reflux   . History of urinary frequency   . Peripheral neuropathy     hands and feet  . Hypertension   . Polysubstance abuse 01/25/2011  . Alcohol abuse   . Seizures    AXIS IV:  economic problems, other psychosocial or environmental problems, problems related to social environment and problems with primary support group AXIS V:  61-70 mild symptoms  Plan Of Care/Follow-up recommendations:   Activity as tolerated and diet  regular  Is patient on multiple antipsychotic therapies at discharge:  No   Has Patient had three or more failed trials of antipsychotic monotherapy by history:  No  Recommended Plan for Multiple Antipsychotic Therapies: NA  Mersadie Kavanaugh,JANARDHAHA R. 10/28/2012, 12:52 PM

## 2012-10-28 NOTE — BHH Group Notes (Signed)
[facility] LCSW Aftercare Discharge Planning Group Note   10/28/2012 8:45 AM  Participation Quality:  Alert and Appropriate   Mood/Affect:  Appropriate and Bright  Depression Rating:  1  Anxiety Rating:  1  Thoughts of Suicide:  Pt denies SI/HI  Will you contract for safety?   Yes  Current AVH:  Pt denies  Plan for Discharge/Comments:  Pt attended discharge planning group and actively participated in group.  CSW provided pt with today's workbook.  Pt reports feeling stable to d/c today.  Pt states that he plans to go back to school to stay busy.  Pt will return home in [place].  Pt will follow up at [facility] for medication management and therapy.  No further needs voiced by pt at this time.    Transportation Means: Pt reports access to transportation - provided pt with bus pass  Supports: No supports mentioned at this time  Blake Arnold, LCSWA 10/28/2012 9:40 AM

## 2012-10-28 NOTE — Discharge Summary (Signed)
Physician Discharge Summary Note  Patient:  Blake Arnold is an 42 y.o., male MRN:  [id number] DOB:  Dec 06, 1970 Patient phone:  [phone] (home)  Patient address:   [address] [place] [address],   Date of Admission:  10/24/2012 Date of Discharge: 10/28/2012   Reason for Admission:  Suicidal ideation with plans to hang or shoot himself  Discharge Diagnoses: Active Problems:   * No active hospital problems. *  ROS  DSM5: LeAssessment:  DSM5:  Substance/Addictive Disorders: Alcohol Related Disorder - Severe (303.90)  Depressive Disorders: Major Depressive Disorder - Severe (296.23)  AXIS I:  Substance/Addictive Disorders: Alcohol Related Disorder - Severe (303.90)  Depressive Disorders: Major Depressive Disorder - Severe (296.23)  AXIS II: Cluster B Traits  AXIS III:  Past Medical History   Diagnosis  Date   .  Hepatitis C    .  Chronic back pain    .  A-fib    .  Depression    .  Anxiety    .  Hepatitis C      history of   .  Chronic back pain    .  Acid reflux    .  History of urinary frequency    .  Peripheral neuropathy      hands and feet   .  Hypertension    .  Polysubstance abuse  01/25/2011   .  Alcohol abuse    .  Seizures     AXIS IV: economic problems and other psychosocial or environmental problems  AXIS V: 21-30 behavior considerably influenced by delusions or hallucinations OR serious impairment in judgment, communication OR inability to function in almost all areas    vel of Care:  OP  Hospital Course:  Blake Arnold was admitted for crisis management and stabilization after he presented to the ED reporting suicidal ideation with plans to hang or shoot himself. He endorsed worsening depression and hopelessness due to his living arrangements and not having work. He had been sober since the end of May and due to his frustration with his situation relapsed on alcohol 2 weeks prior to this admission. He also relapsed on cocaine for 1 day.  Upon arrival at the unit, Blake Arnold was evaluated and his symptoms were identified.  His UDS was + for cocaine, and his BAL was 179. Medication management was initiated with a Librium detox protocol. His depakote was discontinued, as was his gabapentin, since he reported he often forgot his medication.      Prozac was initiated for his symtpoms of depression and for his mania, Lithium CR was started.  Blake Arnold was oriented to the unit and encouraged to participate in unit programming.      He was evaluated each day to assess his response to treatment and to monitor his response to the detox protocol. He had declining CIWA scores and reported very few withdrawal symptoms.  Blake Arnold was cooperative and responded well to the staff. He did not require any 1:1 observation and his behavior was appropriate.       Blake Arnold reported no side effects to his medication and seemed to respond well to a supportive therapeutic environment.  By the day of discharge he was in much improved condition, denied SI/HI and voiced no AVH. He was motivated to continue his medication and agreed to follow up at [facility] [place] as planned.       He was discharged out with the follow up plans as noted below.  Consults:  None  Significant Diagnostic Studies:  labs: CMP, CBC, UA, Valproic Acid Level, UDS  Discharge Vitals:   Blood pressure 130/87, pulse 60, temperature 97.7 F (36.5 C), temperature source Oral, resp. rate 20, height 5\' 6"  (1.676 m), weight 87 kg (191 lb 12.8 oz), SpO2 98.00%. Body mass index is 30.97 kg/(m^2). Lab Results:   No results found for this or any previous visit (from the past 72 hour(s)).  Physical Findings: AIMS: Facial and Oral Movements Muscles of Facial Expression: None, normal Lips and Perioral Area: None, normal Jaw: None, normal Tongue: None, normal,Extremity Movements Upper (arms, wrists, hands, fingers): None, normal Lower (legs, knees, ankles, toes): None, normal, Trunk  Movements Neck, shoulders, hips: None, normal, Overall Severity Severity of abnormal movements (highest score from questions above): None, normal Incapacitation due to abnormal movements: None, normal Patient's awareness of abnormal movements (rate only patient's report): No Awareness, Dental Status Current problems with teeth and/or dentures?: No Does patient usually wear dentures?: No  CIWA:  CIWA-Ar Total: 0 COWS:  COWS Total Score: 6  Psychiatric Specialty Exam: See Psychiatric Specialty Exam and Suicide Risk Assessment completed by Attending Physician prior to discharge.  Discharge destination:  Home  Is patient on multiple antipsychotic therapies at discharge:  No   Has Patient had three or more failed trials of antipsychotic monotherapy by history:  No  Recommended Plan for Multiple Antipsychotic Therapies: NA  Discharge Orders   Future Orders Complete By Expires   Diet - low sodium heart healthy  As directed    Discharge instructions  As directed    Comments:     Take all of your medications as directed. Be sure to keep all of your follow up appointments.  If you are unable to keep your follow up appointment, call your Doctor's office to let them know, and reschedule.  Make sure that you have enough medication to last until your appointment. Be sure to get plenty of rest. Going to bed at the same time each night will help. Try to avoid sleeping during the day.  Increase your activity as tolerated. Regular exercise will help you to sleep better and improve your mental health. Eating a heart healthy diet is recommended. Try to avoid salty or fried foods. Be sure to avoid all alcohol and illegal drugs.   Increase activity slowly  As directed        Medication List    STOP taking these medications       aspirin EC 81 MG tablet     divalproex 500 MG DR tablet  Commonly known as:  DEPAKOTE     gabapentin 300 MG capsule  Commonly known as:  NEURONTIN      TAKE these  medications     Indication   diclofenac 50 MG EC tablet  Commonly known as:  VOLTAREN  Take 1 tablet (50 mg total) by mouth 3 (three) times daily. For joint pain and inflammation.   Indication:  Joint Damage causing Pain and Loss of Function     FLUoxetine 10 MG capsule  Commonly known as:  PROZAC  Take 1 capsule (10 mg total) by mouth daily. For depression.   Indication:  Excessive Use of Alcohol, Depression     lithium carbonate 300 MG CR tablet  Commonly known as:  LITHOBID  Take 1 tablet (300 mg total) by mouth every 12 (twelve) hours. For mood stabilization.   Indication:  Manic-Depression  Follow-up Information   Follow up with [facility] [place] On 10/30/2012. (Walk in on this date for hospital discharge appointment, for medication management and therapy.  Walk in clinic Monday - Friday 8 am - 3 pm. )    Contact information:   [address], [place] [address] Phone: [phone] Fax: [phone]      Follow-up recommendations:   Activities: Resume activity as tolerated. Diet: Heart healthy low sodium diet Tests: You will need to have regular blood work to evaluate your Lithium level. Your out patient provider will schedule this for you. Comments:    Total Discharge Time:  Greater than 30 minutes.  Signed: Rona Ravens. Mashburn RPAC 11:51 AM 10/28/2012   Met with the patient personally, evaluated for suicidal risk assessment, case discussed with the treatment team and formulated the discharge plan.Reviewed the information documented and agree with the treatment plan.  Estiven Kohan,JANARDHAHA R. 10/28/2012 1:01 PM

## 2012-10-28 NOTE — Progress Notes (Signed)
Adult Psychoeducational Group Note  Date:  10/28/2012 Time:  10:00 11:00am Group Topic/Focus:  Therapeutic Activity and Personal Development  Participation Level:  Active  Participation Quality:  Appropriate and Attentive  Affect:  Appropriate  Cognitive:  Appropriate  Insight: Appropriate  Engagement in Group:  Engaged  Modes of Intervention:  Discussion and Education  Additional Comments:  Pt attended and participated in groups. Pt was asked what was his favorite pet? Pt stated his favorite pet was a lab. Pt stated he can't stand liars especially with people he know. That is his biggest trigger.  Shelly Bombard D 10/28/2012, 11:47 AM

## 2012-10-28 NOTE — Tx Team (Signed)
Interdisciplinary Treatment Plan Update (Adult)  Date: 10/28/2012  Time Reviewed:  9:45 AM  Progress in Treatment: Attending groups: Yes Participating in groups:  Yes Taking medication as prescribed:  Yes Tolerating medication:  Yes Family/Significant othe contact made: Yes Patient understands diagnosis:  Yes Discussing patient identified problems/goals with staff:  Yes Medical problems stabilized or resolved:  Yes Denies suicidal/homicidal ideation: Yes Issues/concerns per patient self-inventory:  Yes Other:  New problem(s) identified: N/A  Discharge Plan or Barriers: Pt will follow up at [facility] the [place] for medication management and therapy.    Reason for Continuation of Hospitalization: Stable to d/c today  Comments: N/A  Estimated length of stay: D/C today  For review of initial/current patient goals, please see plan of care.  Attendees: Patient:  Blake Arnold  10/28/2012 10:22 AM   Family:     Physician:  Dr. Javier Glazier 10/28/2012 10:22 AM   Nursing:   Burnetta Sabin, RN 10/28/2012 10:22 AM   Clinical Social Worker:  Reyes Ivan, LCSWA 10/28/2012 10:22 AM   Other: Verne Spurr, PA 10/28/2012 10:22 AM   Other:  Frankey Shown, MA care coordination 10/28/2012 10:22 AM   Other:  Juline Patch, LCSW 10/28/2012 10:22 AM   Other:  Nestor Ramp, RN 10/28/2012 10:22 AM   Other: Onnie Boer, RN case manager 10/28/2012 10:22 AM   Other:    Other:    Other:    Other:      Scribe for Treatment Team:   Carmina Miller, 10/28/2012 , 10:22 AM

## 2012-10-28 NOTE — Progress Notes (Signed)
Pt discharged per MD orders; pt currently denies SI/HI and auditory/visual hallucinations; pt was given education by RN regarding follow-up appointments and medications and pt denied any questions or concerns about these instructions; pt was then escorted to search room to retrieve his belongings by RN before being discharged to hospital lobby. 

## 2012-11-02 NOTE — Progress Notes (Signed)
Patient Discharge Instructions:  After Visit Summary (AVS):   Faxed to:  11/02/12 Discharge Summary Note:   Faxed to:  11/02/12 Psychiatric Admission Assessment Note:   Faxed to:  11/02/12 Suicide Risk Assessment - Discharge Assessment:   Faxed to:  11/02/12 Faxed/Sent to the Next Level Care provider:  11/02/12 Faxed to [facility] the [facility] @ [phone]  Jerelene Redden, 11/02/2012, 3:03 PM

## 2012-11-27 ENCOUNTER — Encounter (HOSPITAL_COMMUNITY): Payer: Self-pay | Admitting: Emergency Medicine

## 2012-11-27 ENCOUNTER — Inpatient Hospital Stay (HOSPITAL_COMMUNITY)
Admission: AD | Admit: 2012-11-27 | Discharge: 2012-11-30 | DRG: 897 | Disposition: A | Discharge status: Home / Self Care | Payer: No Typology Code available for payment source | Source: Intra-hospital | Attending: Psychiatry | Admitting: Psychiatry

## 2012-11-27 ENCOUNTER — Encounter (HOSPITAL_COMMUNITY): Payer: Self-pay | Admitting: *Deleted

## 2012-11-27 ENCOUNTER — Emergency Department (HOSPITAL_COMMUNITY)
Admission: EM | Admit: 2012-11-27 | Discharge: 2012-11-27 | Disposition: A | Discharge status: Other Acute Inpatient Hospital | Payer: Self-pay | Attending: Emergency Medicine | Admitting: Emergency Medicine

## 2012-11-27 DIAGNOSIS — F329 Major depressive disorder, single episode, unspecified: Secondary | ICD-10-CM | POA: Diagnosis present

## 2012-11-27 DIAGNOSIS — R5381 Other malaise: Secondary | ICD-10-CM | POA: Insufficient documentation

## 2012-11-27 DIAGNOSIS — F1020 Alcohol dependence, uncomplicated: Secondary | ICD-10-CM | POA: Diagnosis present

## 2012-11-27 DIAGNOSIS — I10 Essential (primary) hypertension: Secondary | ICD-10-CM | POA: Diagnosis present

## 2012-11-27 DIAGNOSIS — F17200 Nicotine dependence, unspecified, uncomplicated: Secondary | ICD-10-CM

## 2012-11-27 DIAGNOSIS — Z8719 Personal history of other diseases of the digestive system: Secondary | ICD-10-CM | POA: Insufficient documentation

## 2012-11-27 DIAGNOSIS — I4891 Unspecified atrial fibrillation: Secondary | ICD-10-CM

## 2012-11-27 DIAGNOSIS — F10239 Alcohol dependence with withdrawal, unspecified: Principal | ICD-10-CM

## 2012-11-27 DIAGNOSIS — R000 Tachycardia, unspecified: Secondary | ICD-10-CM | POA: Insufficient documentation

## 2012-11-27 DIAGNOSIS — R112 Nausea with vomiting, unspecified: Secondary | ICD-10-CM | POA: Insufficient documentation

## 2012-11-27 DIAGNOSIS — B1920 Unspecified viral hepatitis C without hepatic coma: Secondary | ICD-10-CM | POA: Diagnosis present

## 2012-11-27 DIAGNOSIS — F1010 Alcohol abuse, uncomplicated: Secondary | ICD-10-CM

## 2012-11-27 DIAGNOSIS — Z79899 Other long term (current) drug therapy: Secondary | ICD-10-CM

## 2012-11-27 DIAGNOSIS — F411 Generalized anxiety disorder: Secondary | ICD-10-CM | POA: Insufficient documentation

## 2012-11-27 DIAGNOSIS — F1910 Other psychoactive substance abuse, uncomplicated: Secondary | ICD-10-CM

## 2012-11-27 DIAGNOSIS — Z87448 Personal history of other diseases of urinary system: Secondary | ICD-10-CM | POA: Insufficient documentation

## 2012-11-27 DIAGNOSIS — F1410 Cocaine abuse, uncomplicated: Secondary | ICD-10-CM | POA: Insufficient documentation

## 2012-11-27 DIAGNOSIS — F319 Bipolar disorder, unspecified: Secondary | ICD-10-CM | POA: Diagnosis present

## 2012-11-27 DIAGNOSIS — Z8619 Personal history of other infectious and parasitic diseases: Secondary | ICD-10-CM | POA: Insufficient documentation

## 2012-11-27 DIAGNOSIS — R450 Nervousness: Secondary | ICD-10-CM | POA: Insufficient documentation

## 2012-11-27 DIAGNOSIS — R259 Unspecified abnormal involuntary movements: Secondary | ICD-10-CM | POA: Insufficient documentation

## 2012-11-27 DIAGNOSIS — Z653 Problems related to other legal circumstances: Secondary | ICD-10-CM

## 2012-11-27 DIAGNOSIS — Z8669 Personal history of other diseases of the nervous system and sense organs: Secondary | ICD-10-CM | POA: Insufficient documentation

## 2012-11-27 DIAGNOSIS — R45851 Suicidal ideations: Secondary | ICD-10-CM | POA: Insufficient documentation

## 2012-11-27 DIAGNOSIS — Z8739 Personal history of other diseases of the musculoskeletal system and connective tissue: Secondary | ICD-10-CM | POA: Insufficient documentation

## 2012-11-27 DIAGNOSIS — J219 Acute bronchiolitis, unspecified: Secondary | ICD-10-CM

## 2012-11-27 DIAGNOSIS — R197 Diarrhea, unspecified: Secondary | ICD-10-CM | POA: Insufficient documentation

## 2012-11-27 DIAGNOSIS — F10939 Alcohol use, unspecified with withdrawal, unspecified: Principal | ICD-10-CM

## 2012-11-27 LAB — COMPREHENSIVE METABOLIC PANEL
ALT: 38 U/L (ref 0–53)
Alkaline Phosphatase: 59 U/L (ref 39–117)
BUN: 10 mg/dL (ref 6–23)
CO2: 26 mEq/L (ref 19–32)
Chloride: 100 mEq/L (ref 96–112)
GFR calc Af Amer: >90 mL/min (ref >90)
Glucose, Bld: 74 mg/dL (ref 70–99)
Potassium: 4.5 mEq/L (ref 3.5–5.1)
Sodium: 138 mEq/L (ref 135–145)
Total Bilirubin: 0.4 mg/dL (ref 0.3–1.2)
Total Protein: 8.3 g/dL (ref 6.0–8.3)

## 2012-11-27 LAB — CBC WITH DIFFERENTIAL/PLATELET
Eosinophils Absolute: 0.2 10*3/uL (ref 0.0–0.7)
Hemoglobin: 16.6 g/dL (ref 13.0–17.0)
Lymphocytes Relative: 23 % (ref 12–46)
Lymphs Abs: 1.6 10*3/uL (ref 0.7–4.0)
Monocytes Relative: 6 % (ref 3–12)
Neutro Abs: 4.6 10*3/uL (ref 1.7–7.7)
Neutrophils Relative %: 67 % (ref 43–77)
Platelets: 190 10*3/uL (ref 150–400)
RBC: 4.96 MIL/uL (ref 4.22–5.81)
WBC: 6.8 10*3/uL (ref 4.0–10.5)

## 2012-11-27 LAB — RAPID URINE DRUG SCREEN, HOSP PERFORMED
Barbiturates: NOT DETECTED
Benzodiazepines: NOT DETECTED

## 2012-11-27 LAB — URINALYSIS, ROUTINE W REFLEX MICROSCOPIC
Ketones, ur: NEGATIVE mg/dL
Leukocytes, UA: NEGATIVE
Nitrite: NEGATIVE
Protein, ur: 30 mg/dL — AB
pH: 5.5 (ref 5.0–8.0)

## 2012-11-27 LAB — URINE MICROSCOPIC-ADD ON

## 2012-11-27 MED ORDER — LAMOTRIGINE 25 MG PO TABS
50.0000 mg | ORAL_TABLET | Freq: Every day | ORAL | Status: DC
  Filled 2012-11-27 (×2): qty 2

## 2012-11-27 MED ORDER — FOLIC ACID 1 MG PO TABS
1.0000 mg | ORAL_TABLET | Freq: Every day | ORAL | Status: DC
  Administered 2012-11-27: 1 mg via ORAL
  Filled 2012-11-27: qty 1

## 2012-11-27 MED ORDER — FOLIC ACID 1 MG PO TABS
1.0000 mg | ORAL_TABLET | Freq: Every day | ORAL | Status: DC
  Administered 2012-11-28 – 2012-11-30 (×3): 1 mg via ORAL
  Filled 2012-11-27 (×5): qty 1

## 2012-11-27 MED ORDER — ALUM & MAG HYDROXIDE-SIMETH 200-200-20 MG/5ML PO SUSP: 30.0000 mL | ORAL | Status: DC | PRN

## 2012-11-27 MED ORDER — VITAMIN B-1 100 MG PO TABS
100.0000 mg | ORAL_TABLET | Freq: Every day | ORAL | Status: DC
  Administered 2012-11-28 – 2012-11-30 (×3): 100 mg via ORAL
  Filled 2012-11-27 (×5): qty 1

## 2012-11-27 MED ORDER — ADULT MULTIVITAMIN W/MINERALS CH
1.0000 | ORAL_TABLET | Freq: Every day | ORAL | Status: DC
  Administered 2012-11-28 – 2012-11-30 (×3): 1 via ORAL
  Filled 2012-11-27 (×5): qty 1

## 2012-11-27 MED ORDER — THIAMINE HCL 100 MG/ML IJ SOLN: 100.0000 mg | Freq: Every day | INTRAMUSCULAR | Status: DC

## 2012-11-27 MED ORDER — MAGNESIUM HYDROXIDE 400 MG/5ML PO SUSP: 30.0000 mL | Freq: Every day | ORAL | Status: DC | PRN

## 2012-11-27 MED ORDER — FLUOXETINE HCL 10 MG PO CAPS
10.0000 mg | ORAL_CAPSULE | Freq: Every day | ORAL | Status: DC
  Administered 2012-11-28 – 2012-11-30 (×3): 10 mg via ORAL
  Filled 2012-11-27 (×5): qty 1

## 2012-11-27 MED ORDER — ADULT MULTIVITAMIN W/MINERALS CH
1.0000 | ORAL_TABLET | Freq: Every day | ORAL | Status: DC
  Administered 2012-11-27: 1 via ORAL
  Filled 2012-11-27: qty 1

## 2012-11-27 MED ORDER — VITAMIN B-1 100 MG PO TABS
100.0000 mg | ORAL_TABLET | Freq: Every day | ORAL | Status: DC
  Administered 2012-11-27: 100 mg via ORAL
  Filled 2012-11-27: qty 1

## 2012-11-27 MED ORDER — INFLUENZA VAC SPLIT QUAD 0.5 ML IM SUSP
0.5000 mL | INTRAMUSCULAR | Status: AC
  Filled 2012-11-27: qty 0.5

## 2012-11-27 MED ORDER — HYDROXYZINE HCL 25 MG PO TABS
25.0000 mg | ORAL_TABLET | Freq: Four times a day (QID) | ORAL | Status: DC | PRN
  Administered 2012-11-27: 25 mg via ORAL
  Filled 2012-11-27: qty 10
  Filled 2012-11-27: qty 1

## 2012-11-27 MED ORDER — CHLORDIAZEPOXIDE HCL 25 MG PO CAPS
25.0000 mg | ORAL_CAPSULE | Freq: Three times a day (TID) | ORAL | Status: AC
  Administered 2012-11-28 (×3): 25 mg via ORAL
  Filled 2012-11-27 (×3): qty 1

## 2012-11-27 MED ORDER — DICLOFENAC SODIUM 50 MG PO TBEC
50.0000 mg | DELAYED_RELEASE_TABLET | Freq: Three times a day (TID) | ORAL | Status: DC | PRN
  Filled 2012-11-27: qty 1

## 2012-11-27 MED ORDER — CHLORDIAZEPOXIDE HCL 25 MG PO CAPS
25.0000 mg | ORAL_CAPSULE | Freq: Four times a day (QID) | ORAL | Status: DC | PRN
  Administered 2012-11-27: 25 mg via ORAL
  Filled 2012-11-27 (×2): qty 1

## 2012-11-27 MED ORDER — ACETAMINOPHEN 325 MG PO TABS
650.0000 mg | ORAL_TABLET | Freq: Four times a day (QID) | ORAL | Status: DC | PRN
  Administered 2012-11-29: 650 mg via ORAL

## 2012-11-27 MED ORDER — CHLORDIAZEPOXIDE HCL 25 MG PO CAPS
25.0000 mg | ORAL_CAPSULE | Freq: Every day | ORAL | Status: AC
  Administered 2012-11-30: 25 mg via ORAL

## 2012-11-27 MED ORDER — DICLOFENAC SODIUM 25 MG PO TBEC: 50.0000 mg | DELAYED_RELEASE_TABLET | Freq: Three times a day (TID) | ORAL | Status: DC | PRN

## 2012-11-27 MED ORDER — GABAPENTIN 300 MG PO CAPS
300.0000 mg | ORAL_CAPSULE | Freq: Three times a day (TID) | ORAL | Status: DC
  Administered 2012-11-27 – 2012-11-30 (×9): 300 mg via ORAL
  Filled 2012-11-27 (×15): qty 1

## 2012-11-27 MED ORDER — LOPERAMIDE HCL 2 MG PO CAPS: 2.0000 mg | ORAL_CAPSULE | ORAL | Status: DC | PRN

## 2012-11-27 MED ORDER — CHLORDIAZEPOXIDE HCL 25 MG PO CAPS
50.0000 mg | ORAL_CAPSULE | Freq: Once | ORAL | Status: AC
  Administered 2012-11-27: 25 mg via ORAL
  Filled 2012-11-27: qty 2

## 2012-11-27 MED ORDER — LORAZEPAM 1 MG PO TABS: 1.0000 mg | ORAL_TABLET | Freq: Four times a day (QID) | ORAL | Status: DC | PRN

## 2012-11-27 MED ORDER — LORAZEPAM 2 MG/ML IJ SOLN: 1.0000 mg | Freq: Four times a day (QID) | INTRAMUSCULAR | Status: DC | PRN

## 2012-11-27 MED ORDER — LORAZEPAM 1 MG PO TABS: 0.0000 mg | ORAL_TABLET | Freq: Four times a day (QID) | ORAL | Status: DC

## 2012-11-27 MED ORDER — THIAMINE HCL 100 MG/ML IJ SOLN: 100.0000 mg | Freq: Once | INTRAMUSCULAR | Status: DC

## 2012-11-27 MED ORDER — CHLORDIAZEPOXIDE HCL 25 MG PO CAPS
25.0000 mg | ORAL_CAPSULE | ORAL | Status: AC
  Administered 2012-11-29 (×2): 25 mg via ORAL
  Filled 2012-11-27 (×2): qty 1

## 2012-11-27 MED ORDER — CHLORDIAZEPOXIDE HCL 25 MG PO CAPS
25.0000 mg | ORAL_CAPSULE | Freq: Four times a day (QID) | ORAL | Status: AC
  Administered 2012-11-27: 25 mg via ORAL
  Filled 2012-11-27: qty 1

## 2012-11-27 MED ORDER — FLUOXETINE HCL 10 MG PO CAPS
10.0000 mg | ORAL_CAPSULE | Freq: Every day | ORAL | Status: DC
  Filled 2012-11-27: qty 1

## 2012-11-27 MED ORDER — TRAZODONE HCL 50 MG PO TABS
50.0000 mg | ORAL_TABLET | Freq: Every evening | ORAL | Status: DC | PRN
  Administered 2012-11-27 – 2012-11-29 (×3): 50 mg via ORAL
  Filled 2012-11-27 (×3): qty 1

## 2012-11-27 MED ORDER — LORAZEPAM 1 MG PO TABS: 0.0000 mg | ORAL_TABLET | Freq: Two times a day (BID) | ORAL | Status: DC

## 2012-11-27 MED ORDER — LORAZEPAM 2 MG/ML IJ SOLN
2.0000 mg | Freq: Once | INTRAMUSCULAR | Status: AC
  Administered 2012-11-27: 2 mg via INTRAMUSCULAR
  Filled 2012-11-27: qty 1

## 2012-11-27 MED ORDER — GABAPENTIN 300 MG PO CAPS
300.0000 mg | ORAL_CAPSULE | Freq: Three times a day (TID) | ORAL | Status: DC
  Administered 2012-11-27: 300 mg via ORAL
  Filled 2012-11-27: qty 1

## 2012-11-27 MED ORDER — ONDANSETRON 4 MG PO TBDP: 4.0000 mg | ORAL_TABLET | Freq: Four times a day (QID) | ORAL | Status: DC | PRN

## 2012-11-27 MED ORDER — DICLOFENAC SODIUM 50 MG PO TBEC
50.0000 mg | DELAYED_RELEASE_TABLET | Freq: Three times a day (TID) | ORAL | Status: DC
  Filled 2012-11-27 (×3): qty 1

## 2012-11-27 MED ORDER — LAMOTRIGINE 25 MG PO TABS
50.0000 mg | ORAL_TABLET | Freq: Every day | ORAL | Status: DC
  Administered 2012-11-27 – 2012-11-29 (×3): 50 mg via ORAL
  Filled 2012-11-27 (×6): qty 2

## 2012-11-27 NOTE — ED Notes (Signed)
Security at bedside wanding pt at this time 

## 2012-11-27 NOTE — ED Provider Notes (Signed)
CSN: [id number]     Arrival date & time 11/27/12  0813 History   First MD Initiated Contact with Patient 11/27/12 0825     No chief complaint on file.  (Consider location/radiation/quality/duration/timing/severity/associated sxs/prior Treatment) HPI Comments: Patient complains of one week of suicidal thoughts and alcohol abuse. He states he's having thoughts of hurting himself. He drinks 6-7 40 ounce beers daily. His last drink was 10:30 last night. He is feeling slightly shaky. He said alcohol withdrawal seizures in the past. It hurt anyone else. He is to using cocaine 1 week ago. He said some episodes of vomiting and loose stools. Denies abdominal pain or chest pain.  The history is provided by the patient.    Past Medical History  Diagnosis Date  . Hepatitis C   . Chronic back pain   . A-fib   . Depression   . Anxiety   . Hepatitis C     history of  . Chronic back pain   . Acid reflux   . History of urinary frequency   . Peripheral neuropathy     hands and feet  . Hypertension   . Polysubstance abuse 01/25/2011  . Alcohol abuse   . Seizures    Past Surgical History  Procedure Laterality Date  . Cardioversion  03/07/2006   History reviewed. No pertinent family history. History  Substance Use Topics  . Smoking status: Current Every Day Smoker -- 1.00 packs/day for 25 years    Types: Cigarettes  . Smokeless tobacco: Former Neurosurgeon    Quit date: 10/24/2012  . Alcohol Use: 0.0 oz/week     Comment: 1/5 Vodka; 1-40oz Daily     Review of Systems  Constitutional: Positive for activity change and fatigue. Negative for fever.  Respiratory: Negative for chest tightness.   Cardiovascular: Negative for chest pain.  Gastrointestinal: Positive for nausea and vomiting. Negative for abdominal pain.  Genitourinary: Negative for dysuria and hematuria.  Musculoskeletal: Negative for back pain.  Skin: Negative for rash.  Neurological: Negative for dizziness and headaches.   Psychiatric/Behavioral: Positive for suicidal ideas and self-injury. The patient is nervous/anxious.   A complete 10 system review of systems was obtained and all systems are negative except as noted in the HPI and PMH.    Allergies  Review of patient's allergies indicates no known allergies.  Home Medications   No current outpatient prescriptions on file. BP 125/78  Pulse 82  Temp(Src) 98.1 F (36.7 C) (Oral)  Resp 16  SpO2 97% Physical Exam  Constitutional: He is oriented to person, place, and time. He appears well-developed and well-nourished.  HENT:  Head: Normocephalic and atraumatic.  Mouth/Throat: Oropharynx is clear and moist. No oropharyngeal exudate.  Eyes: Conjunctivae and EOM are normal. Pupils are equal, round, and reactive to light.  Neck: Normal range of motion. Neck supple.  Cardiovascular: Normal rate, regular rhythm and normal heart sounds.   No murmur heard. tachycardia  Pulmonary/Chest: Effort normal and breath sounds normal. No respiratory distress.  Abdominal: Soft. There is no tenderness. There is no rebound and no guarding.  Musculoskeletal: Normal range of motion. He exhibits no edema and no tenderness.  Neurological: He is alert and oriented to person, place, and time. No cranial nerve deficit. He exhibits normal muscle tone. Coordination normal.  mildy tremulous.  Skin: Skin is warm.    ED Course  Procedures (including critical care time) Labs Review Labs Reviewed  ETHANOL - Abnormal; Notable for the following:    Alcohol, Ethyl (B)  18 (*)    All other components within normal limits  URINALYSIS, ROUTINE W REFLEX MICROSCOPIC - Abnormal; Notable for the following:    Protein, ur 30 (*)    All other components within normal limits  URINE MICROSCOPIC-ADD ON - Abnormal; Notable for the following:    Casts GRANULAR CAST (*)    All other components within normal limits  CBC WITH DIFFERENTIAL  COMPREHENSIVE METABOLIC PANEL  URINE RAPID DRUG  SCREEN (HOSP PERFORMED)   Imaging Review No results found.  EKG Interpretation   None       MDM   1. Suicidal ideation   2. Alcohol abuse    Suicidal thoughts with history of alcohol abuse. Mild tachycardia and tremors on exam. Concern for early withdrawal. We'll give Ativan  CIWA protocol initiated and holding orders placed. Labs unremarkable.  Heart rate has improved to the 80s. Patient sleeping comfortably without evidence tremors.  Patient [facility] at behavioral health [facility] by Dr. Dub Mikes. . no evidence of active alcohol withdrawal or DTs.  BP 125/78  Pulse 82  Temp(Src) 98.1 F (36.7 C) (Oral)  Resp 16  SpO2 97%   Glynn Octave, MD 11/27/12 1616

## 2012-11-27 NOTE — ED Notes (Signed)
Patient is resting comfortably, sitter at the bedside. 

## 2012-11-27 NOTE — ED Notes (Signed)
Rancour, MD at bedside. 

## 2012-11-27 NOTE — BH Assessment (Signed)
Assessment Note  Blake Arnold is an 42 y.o. male with history of alcohol abuse and depression.  Patient complains of  suicidal thoughts x1 week.  [name] He states he's having thoughts of hurting himself. He has a plan to hang himself, drinks himself to death, and/or overdose. He has no history of previous suicide attempts. Says that he is stressed b/c his girl friend comes into his life and then breaks up with him over and over again. He is also sad b/c he relapsed on alcohol. He drinks 6-7 40 ounce beers daily and/or 2 fifths of liqour. His last drink was 10:30 last night. He is feeling slightly shaky. He has a history of seizures. He also uses crack cocaine occasionally. He last used 1 week ago. Patient does not have a current psychiatrist or therapist. However, received services from [facility] in the past. He has received inpatient treatment here at [facility]. He was placed Prozax and has remained compliant. No HI. He has visual hallucinations of "things crawling on the floor". No auditory hallucinations.    Axis I: MDD, recurrent, Severe with psychotic features Axis II: Deferred Axis III:  Past Medical History  Diagnosis Date  . Hepatitis C   . Chronic back pain   . A-fib   . Depression   . Anxiety   . Hepatitis C     history of  . Chronic back pain   . Acid reflux   . History of urinary frequency   . Peripheral neuropathy     hands and feet  . Hypertension   . Polysubstance abuse 01/25/2011  . Alcohol abuse   . Seizures    Axis IV: other psychosocial or environmental problems, problems related to social environment, problems with access to health care services and problems with primary support group Axis V: 31-40 impairment in reality testing  Past Medical History:  Past Medical History  Diagnosis Date  . Hepatitis C   . Chronic back pain   . A-fib   . Depression   . Anxiety   . Hepatitis C     history of  . Chronic back pain   . Acid reflux   . History of urinary  frequency   . Peripheral neuropathy     hands and feet  . Hypertension   . Polysubstance abuse 01/25/2011  . Alcohol abuse   . Seizures     Past Surgical History  Procedure Laterality Date  . Cardioversion  03/07/2006    Family History: History reviewed. No pertinent family history.  Social History:  reports that he has been smoking Cigarettes.  He has a 30 pack-year smoking history. He quit smokeless tobacco use about 4 weeks ago. He reports that he drinks alcohol. He reports that he uses illicit drugs ("Crack" cocaine and Cocaine).  Additional Social History:  Alcohol / Drug Use Pain Medications: SEE MAR Prescriptions: SEE MAR Over the Counter: SEE MAR Substance #1 Name of Substance 1: Alcohol 1 - Age of First Use: 43 yrs old  1 - Amount (size/oz): fifth of liqour or 7-8 40oz beers  1 - Frequency: daily  1 - Duration: 2 weeks daily  1 - Last Use / Amount: last night approx. 1030 Substance #2 Name of Substance 2: Cocaine  2 - Age of First Use: 22 or 42 yrs old  2 - Amount (size/oz): "quarter of a gram" 2 - Frequency: occaisional use; pt sts "I don't use that often" 2 - Duration: 1x in the past  week  2 - Last Use / Amount: last week  CIWA: CIWA-Ar BP: 125/78 mmHg Pulse Rate: 82 Nausea and Vomiting: no nausea and no vomiting Tactile Disturbances: none Tremor: not visible, but can be felt fingertip to fingertip Auditory Disturbances: not present Paroxysmal Sweats: no sweat visible Visual Disturbances: not present Anxiety: no anxiety, at ease Headache, Fullness in Head: very mild Agitation: normal activity Orientation and Clouding of Sensorium: oriented and can do serial additions CIWA-Ar Total: 2 COWS:    Allergies: No Known Allergies  Home Medications:  (Not in a hospital admission)  OB/GYN Status:  No LMP for male patient.  General Assessment Data Location of Assessment: [facility] ED Is this a Tele or Face-to-Face Assessment?: Tele Assessment Is this an Initial  Assessment or a Re-assessment for this encounter?: Initial Assessment Living Arrangements: Spouse/significant other Can pt return to current living arrangement?: Yes Admission Status: Voluntary Is patient capable of signing voluntary admission?: Yes Transfer from: Acute Hospital Referral Source: Self/Family/Friend  Medical Screening Exam [facility] Walk-in ONLY) Medical Exam completed: No Reason for MSE not completed: Other: (patient is currently in the ED)  [facility] Crisis Care Plan Living Arrangements: Spouse/significant other Name of Psychiatrist:  (no current povider; previously at [facility] ) Name of Therapist:  (no current provider; previous going to [facility])  Education Status Is patient currently in school?: No  Risk to self Suicidal Ideation: Yes-Currently Present Suicidal Intent: Yes-Currently Present Is patient at risk for suicide?: Yes Suicidal Plan?: Yes-Currently Present Specify Current Suicidal Plan:  (hang self, overdose, drink self to death) Access to Means: Yes Specify Access to Suicidal Means:  (access to materials needed to hang self, OTC and RX, ETOH) What has been your use of drugs/alcohol within the last 12 months?:  (patient reports alcohol and cocaine use) Previous Attempts/Gestures: No How many times?:  (0) Other Self Harm Risks:  (n/a) Triggers for Past Attempts:  (no previous attempts and/or gestures) Intentional Self Injurious Behavior: None Comment - Self Injurious Behavior:  (patient denies ) Family Suicide History: No Recent stressful life event(s): Other (Comment) ("My ex comes and my life then leaves me") Persecutory voices/beliefs?: No Depression: Yes Depression Symptoms: Feeling angry/irritable;Feeling worthless/self pity;Loss of interest in usual pleasures;Guilt;Isolating;Fatigue;Insomnia;Despondent;Tearfulness Substance abuse history and/or treatment for substance abuse?: No Suicide prevention information given to non-admitted  patients: Not applicable  Risk to Others Homicidal Ideation: No Thoughts of Harm to Others: No Current Homicidal Intent: No Current Homicidal Plan: No Access to Homicidal Means: No Identified Victim:  (n/a) History of harm to others?: No Assessment of Violence: None Noted Violent Behavior Description:  (patient currently calm and cooperative ) Does patient have access to weapons?: No Criminal Charges Pending?: No Does patient have a court date: No  Psychosis Hallucinations: None noted Delusions: None noted  Mental Status Report Appear/Hygiene: Disheveled Eye Contact: Fair Motor Activity: Freedom of movement Speech: Logical/coherent Level of Consciousness: Alert Mood: Depressed Affect: Depressed;Anxious Anxiety Level: None Thought Processes: Coherent Judgement: Unimpaired Orientation: Person;Place;Time;Situation;Appropriate for developmental age Obsessive Compulsive Thoughts/Behaviors: None  Cognitive Functioning Concentration: Decreased Memory: Recent Intact;Remote Intact IQ: Average Insight: Poor Impulse Control: Poor Appetite: Fair Weight Loss:  (none reported ) Weight Gain:  (none reported) Sleep: Decreased Total Hours of Sleep:  (varies) Vegetative Symptoms: None  ADLScreening [facility] Assessment Services) Patient's cognitive ability adequate to safely complete daily activities?: Yes Patient able to express need for assistance with ADLs?: Yes Independently performs ADLs?: Yes (appropriate for developmental age)  Prior Inpatient Therapy Prior Inpatient  Therapy: Yes Prior Therapy Dates: 2011, 2012, 2014 Prior Therapy Facilty/Provider(s): [facility], [facility], [facility], [facility] Reason for Treatment: SA  Prior Outpatient Therapy Prior Outpatient Therapy: Yes Prior Therapy Dates:  (past ) Prior Therapy Facilty/Provider(s): [facility] [place] Reason for Treatment: med mgnt  ADL Screening (condition at time of admission) Patient's cognitive ability  adequate to safely complete daily activities?: Yes Is the patient deaf or have difficulty hearing?: No Does the patient have difficulty seeing, even when wearing glasses/contacts?: No Does the patient have difficulty concentrating, remembering, or making decisions?: No Patient able to express need for assistance with ADLs?: Yes Does the patient have difficulty dressing or bathing?: No Independently performs ADLs?: Yes (appropriate for developmental age) Communication: Independent Dressing (OT): Independent Grooming: Independent Feeding: Independent Bathing: Independent Toileting: Independent In/Out Bed: Independent Does the patient have difficulty walking or climbing stairs?: No Weakness of Legs: None Weakness of Arms/Hands: None  Home Assistive Devices/Equipment Home Assistive Devices/Equipment: None    Abuse/Neglect Assessment (Assessment to be complete while patient is alone) Physical Abuse: Denies Verbal Abuse: Denies Sexual Abuse: Denies Exploitation of patient/patient's resources: Denies Self-Neglect: Denies Values / Beliefs Cultural Requests During Hospitalization: None Spiritual Requests During Hospitalization: None   Advance Directives (For Healthcare) Advance Directive: Patient does not have advance directive Nutrition Screen- [facility] Adult/WL/AP Patient's home diet: Regular  Additional Information 1:1 In Past 12 Months?: No CIRT Risk: No Elopement Risk: No Does patient have medical clearance?: Yes     Disposition:  Disposition Initial Assessment Completed for this Encounter: Yes Disposition of Patient: Inpatient treatment program (Accepted by Verne Spurr, NP. to [facility] [id number]) Type of inpatient treatment program: Adult  On Site Evaluation by:   Reviewed with Physician:    [facility] 11/27/2012 2:02 PM

## 2012-11-27 NOTE — ED Notes (Signed)
Patient did say he was here a month ago and had thoughts of hanging himself.  He also mentioned he is going to drink himself to death.  He has recently gone through a separation and feels like living at home with his brother is not safe.  He says they drink a lot and he is around it and does not want to be there.

## 2012-11-27 NOTE — ED Notes (Signed)
Registration at bedside.

## 2012-11-27 NOTE — ED Notes (Signed)
Pt watching tv

## 2012-11-27 NOTE — ED Notes (Signed)
pharmacy tech at bedside 

## 2012-11-27 NOTE — ED Notes (Signed)
Transport has arrived.  

## 2012-11-27 NOTE — ED Notes (Signed)
Sitting with pt until sitter arrives 

## 2012-11-27 NOTE — ED Notes (Signed)
Pharmacy tech at bedside again

## 2012-11-27 NOTE — ED Notes (Signed)
Pt using an urinal

## 2012-11-27 NOTE — Progress Notes (Signed)
Patient ID: [name], male   DOB: [birth year], 42 y.o.   MRN: [id number] 11-27-12 @ 1606  Pt came to [facility] voluntary with some passive si and he was able to verbally contract for the si. He also has been abusing etoh and had a ciwa of "0" on adm. He has a hx of cocaine use. He had a negative uds. Patient complains of one week of suicidal thoughts and alcohol abuse. He states he's having thoughts of hurting himself. He drinks 6-7 40 ounce beers daily. His last drink was 10:30 last night on 11-26-12. He is feeling slightly shaky. He said alcohol withdrawal seizures in the past. He has used cocaine in the last week.  He said some episodes of vomiting and loose stools, but not presently.  He has a medical hx of seizures, htn, dysrhythmia, gerd, hep c and neuropathy. He had no pain on adm and denies any allergies. He was escorted to the 300 hall and report was given to patty,rn.

## 2012-11-27 NOTE — ED Notes (Signed)
[facility] CALLED AND WILL PICK UP PT AT 1330

## 2012-11-27 NOTE — Progress Notes (Signed)
D.  Pt pleasant on approach, lying in bed.  Feeling poorly due to withdrawal symptoms.  Did not attend evening AA group due to not feeling well.  He is a new admission.  Denies SI/HI/hallucinations at this time.  A.  Support and encouragement offered, medication given as ordered for withdrawal symptoms.  R.  Pt remains safe, lying in bed awake, no acute distress noted.  Will continue to monitor.

## 2012-11-27 NOTE — ED Notes (Signed)
Sitter has arrived and with pt at this time

## 2012-11-27 NOTE — ED Notes (Signed)
Phlebotomist at bedside.

## 2012-11-27 NOTE — BH Assessment (Signed)
TA scheduled for 1100. EPD-Dr. Manus Gunning contacted to obtain clinicals before seeing patient.

## 2012-11-27 NOTE — ED Notes (Signed)
telepsych machine  placed in room  

## 2012-11-28 ENCOUNTER — Encounter (HOSPITAL_COMMUNITY): Payer: Self-pay | Admitting: Psychiatry

## 2012-11-28 DIAGNOSIS — F1994 Other psychoactive substance use, unspecified with psychoactive substance-induced mood disorder: Secondary | ICD-10-CM

## 2012-11-28 DIAGNOSIS — F39 Unspecified mood [affective] disorder: Secondary | ICD-10-CM

## 2012-11-28 MED ORDER — LITHIUM CARBONATE ER 300 MG PO TBCR
300.0000 mg | EXTENDED_RELEASE_TABLET | Freq: Two times a day (BID) | ORAL | Status: DC
  Administered 2012-11-28 – 2012-11-30 (×5): 300 mg via ORAL
  Filled 2012-11-28 (×9): qty 1

## 2012-11-28 NOTE — Progress Notes (Signed)
D.  Pt. Denies SI/HI and denies A/V hallucinations.  Pt. Has been in bed sleeping most of the evening and was awakened for  To come to the window for 1700 medications. A.  Pt. Encouraged to attend groups. R.  Pt. Attended group.

## 2012-11-28 NOTE — [facility] Counselor (Signed)
Adult Psychosocial Assessment Update Interdisciplinary Team  Previous [facility] admissions/discharges:  Admissions Discharges  Date:  10/24/12 Date:  10/28/12  Date:  06/08/12 Date:  06/11/12  Date:  04/02/12 Date:  04/07/12  Date:  04/19/11 Date:  04/23/11  Date:  11/11/10 Date:  11/13/10   Changes since the last Psychosocial Assessment (including adherence to outpatient mental health and/or substance abuse treatment, situational issues contributing to decompensation and/or relapse). Says that he is stressed because his girlfriend comes into his life and then breaks up with him over and over again. He is also sad because he relapsed on alcohol. He drinks 6-7 40-ounce beers daily and/or 2 fifths of liqour. His last drink was 10:30 last night. He is feeling slightly shaky. He has a history of seizures. He also uses crack cocaine occasionally. He last used 1 week ago             Discharge Plan 1. Will you be returning to the same living situation after discharge?   Yes:  X No:      If no, what is your plan?    Living with mother and step father but now nieces have moved in along with their BF.  There is currently a lot of arguing and discord in the home.       2. Would you like a referral for services when you are discharged? Yes: XX    If yes, for what services?  Med Mgmt and counseling  No:       Patient does not have a current psychiatrist or therapist. However, received services from [facility] in the past.       Summary and Recommendations (to be completed by the evaluator) This is a 42yo male admitted for detox and suicidal ideation.   He has been having visual hallucinations of "things crawling on the floor".  He has been here 5 times previously and also at [facility] and [facility].  He used to go to [facility] [place] but is not currently connected with providers.  He lives with his mother and stepfather, but more stress has entered into the home with his nieces  and a boyfriend moving in.  He also has gotten back together with his girlfriend, but then broken up several times.  He would benefit from safety monitoring, medication evaluation, psychoeducation, group therapy, and discharge planning to link with ongoing resources.                         Signature:  Sarina Ser, 11/28/2012 12:13 PM

## 2012-11-28 NOTE — Progress Notes (Signed)
Adult Psychoeducational Group Note  Date:  11/28/2012 Time:  3:12 PM  Group Topic/Focus:  Healthy Communication:   The focus of this group is to discuss communication, barriers to communication, as well as healthy ways to communicate with others.  Participation Level:  Did Not Attend   Additional Comments:  Pts are encouraged to attend all group sessions. Pt notified of group beginning but stayed in bed to sleep.   Roanna Banning, [place] N 11/28/2012, 3:12 PM

## 2012-11-28 NOTE — [facility] Group Notes (Signed)
[facility] Group Notes: (Clinical Social Work)   11/28/2012      Type of Therapy:  Group Therapy   Participation Level:  Did Not Attend    Ambrose Mantle, LCSW 11/28/2012, 12:50 PM

## 2012-11-28 NOTE — [facility] Group Notes (Signed)
[facility] Group Notes:  (Nursing/MHT/Case Management/Adjunct)  Date:  11/28/2012  Time:  1:31 PM  Type of Therapy:  Psychoeducational Skills  Participation Level:  Did Not Attend    Blake Arnold 11/28/2012, 1:31 PM

## 2012-11-28 NOTE — Progress Notes (Signed)
Patient ID: Blake Arnold, male   DOB: 09/05/1970, 42 y.o.   MRN: [id number]  D: Pt has been very flat and depressed on the unit, pt has not attended any groups and has not engaged in treatment. Pt was in the bed most of the day and only got up for medication. Pt reported that he was negative SI/HI, no AH/VH noted. A: 15 min checks continued for pt safety. R: Pt safety maintained.

## 2012-11-28 NOTE — H&P (Signed)
Psychiatric Admission Assessment Adult  Patient Identification:  Blake Arnold Date of Evaluation:  11/28/2012 Chief Complaint:  MDD, Alcohol abuse History of Present Illness:: 42 Y/O male who was here in September. States that his ex girlfriend tried to come back in his life twice. This caused some stress for him. Started drinking couple of beers and his drinking escalated. States the there are two nieces living at the house, a lot arguments, they are there with their BF who do not work. He was on Lithium and his outpatient provider took him off as told him it was too complicated as he needed blood levels. He states that the Lithium was working well for him. Has gotten increasingly more depresses with suicidal ideas Elements:  Location:  in patient. Quality:  unbale to function. Severity:  every day. Timing:  building up since he left a months ago. Duration:  lat month. Context:  alcohol dependence/underlying mood anxiety disorder. Associated Signs/Synptoms: Depression Symptoms:  depressed mood, anhedonia, fatigue, feelings of worthlessness/guilt, difficulty concentrating, impaired memory, suicidal thoughts with specific plan, anxiety, insomnia, loss of energy/fatigue, disturbed sleep, (Hypo) Manic Symptoms:  Distractibility, Irritable Mood, Labiality of Mood, Anxiety Symptoms:  Excessive Worry, Panic Symptoms, Psychotic Symptoms:  Paranoia, "million people talking in my head" PTSD Symptoms: Negative  Psychiatric Specialty Exam: Physical Exam  Review of Systems  HENT: Negative.   Eyes: Negative.   Respiratory: Negative.   Cardiovascular: Negative.   Gastrointestinal: Negative.   Genitourinary: Negative.   Musculoskeletal: Negative.   Skin: Negative.   Neurological: Positive for weakness.  Endo/Heme/Allergies: Negative.   Psychiatric/Behavioral: Positive for depression, suicidal ideas and substance abuse.    Blood pressure 125/88, pulse 86, temperature 97.1 F  (36.2 C), temperature source Oral, resp. rate 20, height 5\' 8"  (1.727 m), weight 92.534 kg (204 lb), SpO2 95.00%.Body mass index is 31.03 kg/(m^2).  General Appearance: Disheveled  Eye Contact::  Minimal  Speech:  Clear and Coherent, Slow and not spontaneous  Volume:  fluctuates  Mood:  Anxious, Depressed and Irritable  Affect:  Restricted  Thought Process:  Coherent and Goal Directed  Orientation:  Full (Time, Place, and Person)  Thought Content:  worreis, concerns, symptoms  Suicidal Thoughts:  Yes.  without intent/plan  Homicidal Thoughts:  No  Memory:  Immediate;   Fair Recent;   Fair Remote;   Fair  Judgement:  Fair  Insight:  Present, superficial  Psychomotor Activity:  Restlessness  Concentration:  Fair  Recall:  Poor  Akathisia:  No  Handed:  denies  AIMS (if indicated):     Assets:  Desire for Improvement  Sleep:       Past Psychiatric History: Diagnosis:MDD, Mood Disorder NOS, Alcohol Dependence  Hospitalizations: [facility],  [facility]  Substance Abuse Care: [facility], [facility], was supposed to go to [facility] but blew up alcohol, was not taken  Self-Mutilation: Denies  Suicidal Attempts: Yes  Violent Behaviors: Yes   Past Medical History:   Past Medical History  Diagnosis Date  . Hepatitis C   . Chronic back pain   . A-fib   . Depression   . Anxiety   . Hepatitis C     history of  . Chronic back pain   . Acid reflux   . History of urinary frequency   . Peripheral neuropathy     hands and feet  . Hypertension   . Polysubstance abuse 01/25/2011  . Alcohol abuse   . Seizures    Seizure History:  withdrawal  Traumatic Brain Injury:  fall Allergies:  No Known Allergies PTA Medications: Prescriptions prior to admission  Medication Sig Dispense Refill  . diclofenac (VOLTAREN) 50 MG EC tablet Take 50 mg by mouth 3 (three) times daily. For joint pain and inflammation.      [name] FLUoxetine (PROZAC) 10 MG capsule Take 10 mg by mouth daily. For  depression.      . gabapentin (NEURONTIN) 300 MG capsule Take 300 mg by mouth 3 (three) times daily.      [name] lamoTRIgine (LAMICTAL) 100 MG tablet Take 50 mg by mouth daily.      . [DISCONTINUED] diclofenac (VOLTAREN) 50 MG EC tablet Take 1 tablet (50 mg total) by mouth 3 (three) times daily. For joint pain and inflammation.      . [DISCONTINUED] FLUoxetine (PROZAC) 10 MG capsule Take 1 capsule (10 mg total) by mouth daily. For depression.  30 capsule  0    Previous Psychotropic Medications:  Medication/Dose  Prozac, Neurontin, Campral, lamictal, trazodone               Substance Abuse History in the last 12 months:  yes  Consequences of Substance Abuse: Legal Consequences:  DWI Blackouts:   Withdrawal Symptoms:   Diaphoresis Headaches Nausea Tremors  Social History:  reports that he has been smoking Cigarettes.  He has a 25 pack-year smoking history. He quit smokeless tobacco use about 5 weeks ago. He reports that he drinks alcohol. He reports that he uses illicit drugs ("Crack" cocaine and Cocaine). Additional Social History: Pain Medications: see mar  Prescriptions: see pta list  Over the Counter: none  History of alcohol / drug use?: Yes Longest period of sobriety (when/how long): 9 months  Negative Consequences of Use: Legal;Financial;Personal relationships Withdrawal Symptoms: Tremors Name of Substance 1: eoth  1 - Age of First Use: 42 yrs old  1 - Amount (size/oz): fifth of liquor or 7-8 40 oz beers  1 - Frequency: daily  1 - Duration: 2 wks daily  1 - Last Use / Amount: 11-26-12 Name of Substance 2: cocaine  2 - Age of First Use: 34  or 42 yrs old  2 - Amount (size/oz): 1/4 of a gram  2 - Frequency: occasional not often  2 - Duration: x1 in the past week  2 - Last Use / Amount: last week                 Current Place of Residence:   Living with mother and step father but now nieces moved in with their BF Place of Birth:   Family Members: Marital  Status:  Single Children: Denies  Sons:  Daughters: Relationships: 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 Legal History: Armed robbery, B and E, probation violation (9-10 months of incarceration) Hobbies/Interests:  Family History:  History reviewed. No pertinent family history.  Results for orders placed during the hospital encounter of 11/27/12 (from the past 72 hour(s))  CBC WITH DIFFERENTIAL     Status: None   Collection Time    11/27/12  9:34 AM      Result Value Range   WBC 6.8  4.0 - 10.5 K/uL   RBC 4.96  4.22 - 5.81 MIL/uL   Hemoglobin 16.6  13.0 - 17.0 g/dL   HCT 47.5  39.0 - 52.0 %   MCV 95.8  78.0 - 100.0 fL   MCH 33.5  26.0 - 34.0 pg   MCHC 34.9  30.0 - 36.0 g/dL   RDW 16.1  09.6 - 04.5 %   Platelets 190  150 - 400 K/uL   Neutrophils Relative % 67  43 - 77 %   Neutro Abs 4.6  1.7 - 7.7 K/uL   Lymphocytes Relative 23  12 - 46 %   Lymphs Abs 1.6  0.7 - 4.0 K/uL   Monocytes Relative 6  3 - 12 %   Monocytes Absolute 0.4  0.1 - 1.0 K/uL   Eosinophils Relative 2  0 - 5 %   Eosinophils Absolute 0.2  0.0 - 0.7 K/uL   Basophils Relative 1  0 - 1 %   Basophils Absolute 0.1  0.0 - 0.1 K/uL  COMPREHENSIVE METABOLIC PANEL     Status: None   Collection Time    11/27/12  9:34 AM      Result Value Range   Sodium 138  135 - 145 mEq/L   Potassium 4.5  3.5 - 5.1 mEq/L   Chloride 100  96 - 112 mEq/L   CO2 26  19 - 32 mEq/L   Glucose, Bld 74  70 - 99 mg/dL   BUN 10  6 - 23 mg/dL   Creatinine, Ser 4.09  0.50 - 1.35 mg/dL   Calcium 9.5  8.4 - 81.1 mg/dL   Total Protein 8.3  6.0 - 8.3 g/dL   Albumin 4.1  3.5 - 5.2 g/dL   AST 33  0 - 37 U/L   ALT 38  0 - 53 U/L   Alkaline Phosphatase 59  39 - 117  U/L   Total Bilirubin 0.4  0.3 - 1.2 mg/dL   GFR calc non Af Amer >90  >90 mL/min   GFR calc Af Amer >90  >90 mL/min   Comment: (NOTE)     The eGFR has been calculated using the CKD EPI equation.     This calculation has not been validated in all clinical situations.     eGFR's persistently <90 mL/min signify possible Chronic Kidney     Disease.  ETHANOL     Status: Abnormal   Collection Time    11/27/12  9:34 AM      Result Value Range   Alcohol, Ethyl (B) 18 (*) 0 - 11 mg/dL   Comment:            LOWEST DETECTABLE LIMIT FOR     SERUM ALCOHOL IS 11 mg/dL     FOR MEDICAL PURPOSES ONLY  URINE RAPID DRUG SCREEN (HOSP PERFORMED)     Status: None   Collection Time    11/27/12  9:46 AM      Result Value Range   Opiates NONE DETECTED  NONE DETECTED   Cocaine NONE DETECTED  NONE DETECTED   Benzodiazepines NONE DETECTED  NONE DETECTED   Amphetamines NONE DETECTED  NONE DETECTED   Tetrahydrocannabinol NONE DETECTED  NONE DETECTED   Barbiturates NONE DETECTED  NONE DETECTED   Comment:            DRUG SCREEN FOR MEDICAL PURPOSES     ONLY.  IF CONFIRMATION IS NEEDED     FOR ANY PURPOSE, NOTIFY LAB     WITHIN 5 DAYS.                LOWEST DETECTABLE LIMITS  FOR URINE DRUG SCREEN     Drug Class       Cutoff (ng/mL)     Amphetamine      1000     Barbiturate      200     Benzodiazepine   200     Tricyclics       300     Opiates          300     Cocaine          300     THC              50  URINALYSIS, ROUTINE W REFLEX MICROSCOPIC     Status: Abnormal   Collection Time    11/27/12  9:46 AM      Result Value Range   Color, Urine YELLOW  YELLOW   APPearance CLEAR  CLEAR   Specific Gravity, Urine 1.019  1.005 - 1.030   pH 5.5  5.0 - 8.0   Glucose, UA NEGATIVE  NEGATIVE mg/dL   Hgb urine dipstick NEGATIVE  NEGATIVE   Bilirubin Urine NEGATIVE  NEGATIVE   Ketones, ur NEGATIVE  NEGATIVE mg/dL   Protein, ur 30 (*) NEGATIVE mg/dL   Urobilinogen, UA 0.2  0.0 - 1.0 mg/dL   Nitrite  NEGATIVE  NEGATIVE   Leukocytes, UA NEGATIVE  NEGATIVE  URINE MICROSCOPIC-ADD ON     Status: Abnormal   Collection Time    11/27/12  9:46 AM      Result Value Range   Squamous Epithelial / LPF RARE  RARE   Casts GRANULAR CAST (*) NEGATIVE   Psychological Evaluations:  Assessment:   DSM5:  Schizophrenia Disorders:   Obsessive-Compulsive Disorders:   Trauma-Stressor Disorders:   Substance/Addictive Disorders:  Alcohol Related Disorder - Severe (303.90) Depressive Disorders:  Major Depressive Disorder - Severe (296.23)  AXIS I:  Mood Disorder NOS and Substance Induced Mood Disorder AXIS II:  Deferred AXIS III:   Past Medical History  Diagnosis Date  . Hepatitis C   . Chronic back pain   . A-fib   . Depression   . Anxiety   . Hepatitis C     history of  . Chronic back pain   . Acid reflux   . History of urinary frequency   . Peripheral neuropathy     hands and feet  . Hypertension   . Polysubstance abuse 01/25/2011  . Alcohol abuse   . Seizures    AXIS IV:  housing problems, other psychosocial or environmental problems and problems with primary support group AXIS V:  41-50 serious symptoms  Treatment Plan/Recommendations:  Supportive approach/coping skills/relapse prevention                                                                 Detox as needed                                                                  Reassess and address the co morbities  Treatment Plan Summary: Daily  contact with patient to assess and evaluate symptoms and progress in treatment Medication management Current Medications:  Current Facility-Administered Medications  Medication Dose Route Frequency Provider Last Rate Last Dose  . acetaminophen (TYLENOL) tablet 650 mg  650 mg Oral Q6H PRN Verne Spurr, PA-C      . alum & mag hydroxide-simeth (MAALOX/MYLANTA) 200-200-20 MG/5ML suspension 30 mL  30 mL Oral Q4H PRN Verne Spurr, PA-C      . chlordiazePOXIDE (LIBRIUM) capsule 25 mg  25  mg Oral Q6H PRN Verne Spurr, PA-C   25 mg at 11/27/12 2145  . chlordiazePOXIDE (LIBRIUM) capsule 25 mg  25 mg Oral TID Verne Spurr, PA-C   25 mg at 11/28/12 0753   Followed by  . [START ON 11/29/2012] chlordiazePOXIDE (LIBRIUM) capsule 25 mg  25 mg Oral BH-qamhs Verne Spurr, PA-C       Followed by  . [START ON 11/30/2012] chlordiazePOXIDE (LIBRIUM) capsule 25 mg  25 mg Oral Daily Verne Spurr, PA-C      . diclofenac (VOLTAREN) EC tablet 50 mg  50 mg Oral TID PRN Verne Spurr, PA-C      . FLUoxetine (PROZAC) capsule 10 mg  10 mg Oral Daily Verne Spurr, PA-C   10 mg at 11/28/12 0754  . folic acid (FOLVITE) tablet 1 mg  1 mg Oral Daily Verne Spurr, PA-C   1 mg at 11/28/12 0754  . gabapentin (NEURONTIN) capsule 300 mg  300 mg Oral TID Verne Spurr, PA-C   300 mg at 11/28/12 0753  . hydrOXYzine (ATARAX/VISTARIL) tablet 25 mg  25 mg Oral Q6H PRN Verne Spurr, PA-C   25 mg at 11/27/12 2145  . influenza vac split quadrivalent PF (FLUARIX) injection 0.5 mL  0.5 mL Intramuscular Tomorrow-1000 Rachael Fee, MD      . lamoTRIgine (LAMICTAL) tablet 50 mg  50 mg Oral Daily Verne Spurr, PA-C   50 mg at 11/27/12 2144  . loperamide (IMODIUM) capsule 2-4 mg  2-4 mg Oral PRN Verne Spurr, PA-C      . magnesium hydroxide (MILK OF MAGNESIA) suspension 30 mL  30 mL Oral Daily PRN Verne Spurr, PA-C      . multivitamin with minerals tablet 1 tablet  1 tablet Oral Daily Verne Spurr, PA-C   1 tablet at 11/28/12 0753  . ondansetron (ZOFRAN-ODT) disintegrating tablet 4 mg  4 mg Oral Q6H PRN Verne Spurr, PA-C      . thiamine (B-1) injection 100 mg  100 mg Intramuscular Once [employer], PA-C      . thiamine (VITAMIN B-1) tablet 100 mg  100 mg Oral Daily Verne Spurr, PA-C   100 mg at 11/28/12 0753  . traZODone (DESYREL) tablet 50 mg  50 mg Oral QHS PRN,MR X 1 Court Joy, PA-C   50 mg at 11/27/12 2145    Observation Level/Precautions:  15 minute checks  Laboratory:  As per the ED   Psychotherapy:  Individual/group  Medications:  Librium detox/resume the Lithium  Consultations:    Discharge Concerns:    Estimated LOS: 3-5 days  Other:     I certify that inpatient services furnished can reasonably be expected to improve the patient's condition.   Manali Mcelmurry A 10/18/201410:19 AM

## 2012-11-28 NOTE — [facility] Suicide Risk Assessment (Signed)
Suicide Risk Assessment  Admission Assessment     Nursing information obtained from:  Patient Demographic factors:  Male;Caucasian;Low socioeconomic status;Unemployed Current Mental Status:  NA (denies si/hi/av. ) Loss Factors:  Decrease in vocational status;Loss of significant relationship;Decline in physical health;Financial problems / change in socioeconomic status Historical Factors:  Family history of mental illness or substance abuse;Impulsivity Risk Reduction Factors:  Religious beliefs about death;Living with another person, especially a relative;Positive social support  CLINICAL FACTORS:   Depression:   Comorbid alcohol abuse/dependence Alcohol/Substance Abuse/Dependencies  COGNITIVE FEATURES THAT CONTRIBUTE TO RISK:  Closed-mindedness Polarized thinking Thought constriction (tunnel vision)    SUICIDE RISK:   Moderate:  Frequent suicidal ideation with limited intensity, and duration, some specificity in terms of plans, no associated intent, good self-control, limited dysphoria/symptomatology, some risk factors present, and identifiable protective factors, including available and accessible social support.  PLAN OF CARE: Supportive approach/coping skills/relapse prevention                              Librium Detox/reassess and address the co morbities  I certify that inpatient services furnished can reasonably be expected to improve the patient's condition.  Jerika Wales A 11/28/2012, 4:36 PM

## 2012-11-29 NOTE — Progress Notes (Signed)
D: Patient observed active on the unit. Denies S/I, H/I. Patient denies AH/VH. Patient attended group sessions. Patient reports feeling better.   A: Support and encouragement offered. Medications administered as ordered along with Trazodone. Q15 minute checks observed for safety.    R:Patient contracts for safety. Patient safety maintained on the unit.  Carrolyn Leigh, RN BSN 11/29/2012 11:56 PM

## 2012-11-29 NOTE — Progress Notes (Signed)
The focus of this group is to educate the patient on the purpose and policies of crisis stabilization and provide a format to answer questions about their admission.  The group details unit policies and expectations of patients while admitted.  Patient attended 0900 nurse orientation education group this morning.  Patient actively participated, appropriate affect, alert, appropriate insight and engagement.  Today patient will work on 3 goals for discharge.  

## 2012-11-29 NOTE — Progress Notes (Signed)
Patient did attend the evening speaker AA meeting.  

## 2012-11-29 NOTE — [facility] Group Notes (Signed)
[facility] Group Notes:  (Clinical Social Work)  11/29/2012  10:00-11:00AM  Summary of Progress/Problems:   The main focus of today's process group was to   identify the patient's current support system and decide on other supports that can be put in place.  The picture on workbook was used to discuss why additional supports are needed, and a hand-out was distributed with four definitions/levels of support, then used to talk about how patients have given and received all different kinds of support.  An emphasis was placed on using counselor, doctor, therapy groups, 12-step groups, and problem-specific support groups to expand supports.  The patient identified current supports of mother, stepfather, therapist, and doctor.  He talked freely about how he needs additional supports to keep him focused on things other than using.  Type of Therapy:  Process Group with Motivational Interviewing  Participation Level:  Active  Participation Quality:  Attentive and Sharing  Affect:  Blunted  Cognitive:  Appropriate and Oriented  Insight:  Developing/Improving  Engagement in Therapy:  Engaged  Modes of Intervention:   Education, Support and Processing, Activity  [employer], LCSW 11/29/2012, 12:24 PM

## 2012-11-29 NOTE — Progress Notes (Signed)
[facility] MD Progress Note  11/29/2012 8:15 PM Blake Arnold  MRN:  [id number] Subjective:  Blake Arnold states that he came as he did not want it to get to the point it got last time he was here. He will have to go back to the house where the nieces are still staying with their boyfriends but thinks that the Lithium is going to help with his mood his anger irritability so he can deal with the situation better as he cant get another place to stay. Diagnosis:   DSM5: Schizophrenia Disorders:   Obsessive-Compulsive Disorders:   Trauma-Stressor Disorders:   Substance/Addictive Disorders:  Alcohol Related Disorder - Severe (303.90) Depressive Disorders:  Major Depressive Disorder - Severe (296.23)  Axis I: Mood Disorder NOS  ADL's:  Intact  Sleep: Fair  Appetite:  Fair  Suicidal Ideation:  Plan:  denies Intent:  denies Means:  denies Homicidal Ideation:  Plan:  denies Intent:  denies Means:  denies AEB (as evidenced by):  Psychiatric Specialty Exam: Review of Systems  Constitutional: Negative.   HENT: Negative.   Eyes: Negative.   Respiratory: Negative.   Cardiovascular: Negative.   Gastrointestinal: Negative.   Genitourinary: Negative.   Musculoskeletal: Negative.   Skin: Negative.   Neurological: Negative.   Endo/Heme/Allergies: Negative.   Psychiatric/Behavioral: Positive for depression and substance abuse. The patient is nervous/anxious.     Blood pressure 120/82, pulse 81, temperature 97.8 F (36.6 C), temperature source Oral, resp. rate 18, height 5\' 8"  (1.727 m), weight 92.534 kg (204 lb), SpO2 95.00%.Body mass index is 31.03 kg/(m^2).  General Appearance: Fairly Groomed  Patent attorney::  Fair  Speech:  Clear and Coherent  Volume:  fluctuates  Mood:  Anxious and worried  Affect:  anxious, worried  Thought Process:  Coherent and Goal Directed  Orientation:  Full (Time, Place, and Person)  Thought Content:  worries, concerns  Suicidal Thoughts:  No  Homicidal Thoughts:   No  Memory:  Immediate;   Fair Recent;   Fair Remote;   Fair  Judgement:  Fair  Insight:  Present  Psychomotor Activity:  Restlessness  Concentration:  Fair  Recall:  Fair  Akathisia:  No  Handed:    AIMS (if indicated):     Assets:  Desire for Improvement  Sleep:  Number of Hours: 6.25   Current Medications: Current Facility-Administered Medications  Medication Dose Route Frequency Provider Last Rate Last Dose  . acetaminophen (TYLENOL) tablet 650 mg  650 mg Oral Q6H PRN Verne Spurr, PA-C   650 mg at 11/29/12 1841  . alum & mag hydroxide-simeth (MAALOX/MYLANTA) 200-200-20 MG/5ML suspension 30 mL  30 mL Oral Q4H PRN Verne Spurr, PA-C      . chlordiazePOXIDE (LIBRIUM) capsule 25 mg  25 mg Oral Q6H PRN Verne Spurr, PA-C   25 mg at 11/27/12 2145  . chlordiazePOXIDE (LIBRIUM) capsule 25 mg  25 mg Oral BH-qamhs Neil Mashburn, PA-C   25 mg at 11/29/12 0758   Followed by  . [START ON 11/30/2012] chlordiazePOXIDE (LIBRIUM) capsule 25 mg  25 mg Oral Daily Verne Spurr, PA-C      . diclofenac (VOLTAREN) EC tablet 50 mg  50 mg Oral TID PRN Verne Spurr, PA-C      . FLUoxetine (PROZAC) capsule 10 mg  10 mg Oral Daily Verne Spurr, PA-C   10 mg at 11/29/12 0758  . folic acid (FOLVITE) tablet 1 mg  1 mg Oral Daily Verne Spurr, PA-C   1 mg at 11/29/12 0758  .  gabapentin (NEURONTIN) capsule 300 mg  300 mg Oral TID Verne Spurr, PA-C   300 mg at 11/29/12 1705  . hydrOXYzine (ATARAX/VISTARIL) tablet 25 mg  25 mg Oral Q6H PRN Verne Spurr, PA-C   25 mg at 11/27/12 2145  . lamoTRIgine (LAMICTAL) tablet 50 mg  50 mg Oral Daily Verne Spurr, PA-C   50 mg at 11/28/12 2139  . lithium carbonate (LITHOBID) CR tablet 300 mg  300 mg Oral Q12H Rachael Fee, MD   300 mg at 11/29/12 0758  . loperamide (IMODIUM) capsule 2-4 mg  2-4 mg Oral PRN Verne Spurr, PA-C      . magnesium hydroxide (MILK OF MAGNESIA) suspension 30 mL  30 mL Oral Daily PRN Verne Spurr, PA-C      . multivitamin with minerals  tablet 1 tablet  1 tablet Oral Daily Verne Spurr, PA-C   1 tablet at 11/29/12 0758  . ondansetron (ZOFRAN-ODT) disintegrating tablet 4 mg  4 mg Oral Q6H PRN Verne Spurr, PA-C      . thiamine (B-1) injection 100 mg  100 mg Intramuscular Once [employer], PA-C      . thiamine (VITAMIN B-1) tablet 100 mg  100 mg Oral Daily Verne Spurr, PA-C   100 mg at 11/29/12 0758  . traZODone (DESYREL) tablet 50 mg  50 mg Oral QHS PRN,MR X 1 Court Joy, PA-C   50 mg at 11/28/12 2141    Lab Results: No results found for this or any previous visit (from the past 48 hour(s)).  Physical Findings: AIMS: Facial and Oral Movements Muscles of Facial Expression: None, normal Lips and Perioral Area: None, normal Jaw: None, normal Tongue: None, normal,Extremity Movements Upper (arms, wrists, hands, fingers): None, normal Lower (legs, knees, ankles, toes): None, normal, Trunk Movements Neck, shoulders, hips: None, normal, Overall Severity Severity of abnormal movements (highest score from questions above): None, normal Incapacitation due to abnormal movements: None, normal Patient's awareness of abnormal movements (rate only patient's report): No Awareness, Dental Status Current problems with teeth and/or dentures?: No Does patient usually wear dentures?: No  CIWA:  CIWA-Ar Total: 0 COWS:     Treatment Plan Summary: Daily contact with patient to assess and evaluate symptoms and progress in treatment Medication management  Plan: Supportive approach/coping skills/relapse prevention           Reassess and address the co morbidities           Continue the detox  Medical Decision Making Problem Points:  Review of psycho-social stressors (1) Data Points:  Review of medication regiment & side effects (2)  I certify that inpatient services furnished can reasonably be expected to improve the patient's condition.   [name] 11/29/2012, 8:15 PM

## 2012-11-29 NOTE — Progress Notes (Signed)
Patient ID: Blake Arnold, male   DOB: 1970/10/01, 42 y.o.   MRN: [id number]  D: Pt has been flat and depressed on the unit, pt reported that he was having a much better day. Pt reported that he has felt the best that he has ever felt, and that he hopes that he continues to feel this way. Pt did attend groups today and did engage in treatment.Pt reported being negative SI/HI, no AH/VH noted. A: 15 minute checks continued for patient safety. R: Pts safety maintained.

## 2012-11-30 DIAGNOSIS — F1010 Alcohol abuse, uncomplicated: Secondary | ICD-10-CM

## 2012-11-30 DIAGNOSIS — F3130 Bipolar disorder, current episode depressed, mild or moderate severity, unspecified: Secondary | ICD-10-CM

## 2012-11-30 MED ORDER — LITHIUM CARBONATE ER 300 MG PO TBCR: 300.0000 mg | EXTENDED_RELEASE_TABLET | Freq: Two times a day (BID) | ORAL | Status: DC

## 2012-11-30 MED ORDER — TRAZODONE HCL 50 MG PO TABS: 50.0000 mg | ORAL_TABLET | Freq: Every evening | ORAL | Status: DC | PRN

## 2012-11-30 MED ORDER — LAMOTRIGINE 25 MG PO TABS: 50.0000 mg | ORAL_TABLET | Freq: Every day | ORAL | Status: DC

## 2012-11-30 MED ORDER — GABAPENTIN 300 MG PO CAPS: 300.0000 mg | ORAL_CAPSULE | Freq: Three times a day (TID) | ORAL | Status: DC

## 2012-11-30 MED ORDER — LITHIUM CARBONATE ER 300 MG PO TBCR
300.0000 mg | EXTENDED_RELEASE_TABLET | Freq: Two times a day (BID) | ORAL | Status: DC
  Filled 2012-11-30: qty 28

## 2012-11-30 MED ORDER — FLUOXETINE HCL 10 MG PO CAPS: 10.0000 mg | ORAL_CAPSULE | Freq: Every day | ORAL | Status: DC

## 2012-11-30 MED ORDER — GABAPENTIN 300 MG PO CAPS
300.0000 mg | ORAL_CAPSULE | Freq: Three times a day (TID) | ORAL | Status: DC
  Filled 2012-11-30: qty 42

## 2012-11-30 MED ORDER — DICLOFENAC SODIUM 50 MG PO TBEC: 50.0000 mg | DELAYED_RELEASE_TABLET | Freq: Three times a day (TID) | ORAL | Status: DC | PRN

## 2012-11-30 MED ORDER — FLUOXETINE HCL 10 MG PO CAPS
10.0000 mg | ORAL_CAPSULE | Freq: Every day | ORAL | Status: DC
  Filled 2012-11-30: qty 14

## 2012-11-30 MED ORDER — HYDROXYZINE HCL 25 MG PO TABS: 25.0000 mg | ORAL_TABLET | Freq: Four times a day (QID) | ORAL | Status: DC | PRN

## 2012-11-30 MED ORDER — LAMOTRIGINE 100 MG PO TABS
50.0000 mg | ORAL_TABLET | Freq: Every day | ORAL | Status: DC
  Filled 2012-11-30: qty 7

## 2012-11-30 MED ORDER — TRAZODONE HCL 50 MG PO TABS
50.0000 mg | ORAL_TABLET | Freq: Every evening | ORAL | Status: DC | PRN
  Filled 2012-11-30 (×10): qty 28

## 2012-11-30 NOTE — B[facility] Group Notes (Signed)
[facility] LCSW Aftercare Discharge Planning Group Note   11/30/2012 9:41 AM  Participation Quality:  Appropriate   Mood/Affect:  Appropriate  Depression Rating:  1  Anxiety Rating:  0  Thoughts of Suicide:  No Will you contract for safety?   NA  Current AVH:  No  Plan for Discharge/Comments:  Pt plans to d/c today in order to make his appt at [facility] with Myrtie Neither for med management and to be set up with therapist. Appt at 1:15PM Tuesday. Pt reports no withdrawal symptoms today and feels stable for d/c.   Transportation Means: niece coming at 3-4PM   Supports: family members   Smart, Research scientist (physical sciences)

## 2012-11-30 NOTE — BHH Suicide Risk Assessment (Signed)
[facility] INPATIENT:  Family/Significant Other Suicide Prevention Education  Suicide Prevention Education:  Contact Attempts: Burnett Kanaris (pt's mother) [phone] has been identified by the patient as the family member/significant other with whom the patient will be residing, and identified as the person(s) who will aid the patient in the event of a mental health crisis.  With written consent from the patient, two attempts were made to provide suicide prevention education, prior to and/or following the patient's discharge.  We were unsuccessful in providing suicide prevention education.  A suicide education pamphlet was given to the patient to share with family/significant other.  Date and time of first attempt: 11/30/12 10:00AM Date and time of second attempt: 11/30/12 11:30AM (voicemail left)   Smart, Catera Hankins 11/30/2012, 11:34 AM

## 2012-11-30 NOTE — Tx Team (Signed)
Interdisciplinary Treatment Plan Update (Adult)  Date: 11/30/2012   Time Reviewed: 11:37 AM  Progress in Treatment:  Attending groups: Yes  Participating in groups:  Yes  Taking medication as prescribed: Yes  Tolerating medication: Yes  Family/Significant othe contact made: contact attempts made with pt's mother. SPE completed with pt.  Patient understands diagnosis: Yes, AEB seeking treatment for depression, ETOH detox, and SI.  Discussing patient identified problems/goals with staff: Yes  Medical problems stabilized or resolved: Yes  Denies suicidal/homicidal ideation: Yes during group/self report.  Patient has not harmed self or Others: Yes  New problem(s) identified: n/a  Discharge Plan or Barriers: Pt plans to followup at [facility] for med management and therapy. He will return home to live with his parents and will be picked up by his niece around 39-4PM.  Additional comments: Blake Arnold is an 42 y.o. male with history of alcohol abuse and depression. Patient complains of suicidal thoughts x1 week. [name] He states he's having thoughts of hurting himself. He has a plan to hang himself, drinks himself to death, and/or overdose. He has no history of previous suicide attempts. Says that he is stressed b/c his girl friend comes into his life and then breaks up with him over and over again. He is also sad b/c he relapsed on alcohol. He drinks 6-7 40 ounce beers daily and/or 2 fifths of liqour. His last drink was 10:30 last night. He is feeling slightly shaky. He has a history of seizures. He also uses crack cocaine occasionally. He last used 1 week ago. Patient does not have a current psychiatrist or therapist. However, received services from [facility] in the past. He has received inpatient treatment here at [facility]. He was placed Prozax and has remained compliant. No HI. Reason for Continuation of Hospitalization: d/c today  Estimated length of stay: d/c today  For review of initial/current patient  goals, please see plan of care.  Attendees:  Patient:    Family:    Physician: Celso Amy NP 11/30/2012 11:36 AM   Nursing: Meryl Dare RN  11/30/2012 11:36 AM   Clinical Social Worker Burney Calzadilla Smart, LCSWA  11/30/2012 11:36 AM   Other: Darden Dates Nurse Cm  11/30/2012 11:36 AM   Other:    Other:    Other:    Scribe for Treatment Team:  [employer] LCSWA 11/30/2012 11:37 AM
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
## 2012-11-30 NOTE — Progress Notes (Signed)
Pt d/c from hospital. All items returned. D/C instructions given and prescriptions given. Pt denies si and hi.
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
## 2012-11-30 NOTE — Progress Notes (Signed)
[facility] Adult Case Management Discharge Plan :  Will you be returning to the same living situation after discharge: Yes,  home with parents  At discharge, do you have transportation home?:Yes,  pt's niece coming between 3PM-4PM to transport pt home. Do you have the ability to pay for your medications:Yes,  mental health  Release of information consent forms completed and in the chart;  Patient's signature needed at discharge.  Patient to Follow up at: Follow-up Information   Follow up with [facility] On 12/01/2012. (Appt. at 1:15PM with Myrtie Neither for hospital follow-up/medication management/assessment for therapy services. )    Contact information:   [address], [place] [address] Phone: [phone] Fax: [phone]      Patient denies SI/HI:   Yes,  during group/self report.    Safety Planning and Suicide Prevention discussed:  Yes,  Contact attempts made with pt's mother (voicemail left). SPE completed with pt. SPI pamphlet provided to pt and he was encouraged to share information with support network, ask questions, and talk about concerns.   Blake Arnold, Blake Arnold 11/30/2012, 11:40 AM

## 2012-12-04 NOTE — Progress Notes (Signed)
Patient Discharge Instructions:  After Visit Summary (AVS):   Faxed to:  12/04/12 Discharge Summary Note:   Faxed to:  12/04/12 Psychiatric Admission Assessment Note:   Faxed to:  12/04/12 Suicide Risk Assessment - Discharge Assessment:   Faxed to:  12/04/12 Faxed/Sent to the Next Level Care provider:  12/04/12 Faxed to [facility] @ [phone]  Jerelene Redden, 12/04/2012, 1:15 PM

## 2012-12-17 ENCOUNTER — Other Ambulatory Visit: Payer: Self-pay

## 2013-02-17 ENCOUNTER — Emergency Department (HOSPITAL_COMMUNITY): Payer: Self-pay

## 2013-02-17 ENCOUNTER — Encounter (HOSPITAL_COMMUNITY): Payer: Self-pay | Admitting: Emergency Medicine

## 2013-02-17 ENCOUNTER — Emergency Department (HOSPITAL_COMMUNITY)
Admission: EM | Admit: 2013-02-17 | Discharge: 2013-02-18 | Disposition: A | Discharge status: Other Acute Inpatient Hospital | Payer: Self-pay | Attending: Emergency Medicine | Admitting: Emergency Medicine

## 2013-02-17 DIAGNOSIS — F17200 Nicotine dependence, unspecified, uncomplicated: Secondary | ICD-10-CM | POA: Insufficient documentation

## 2013-02-17 DIAGNOSIS — Z8669 Personal history of other diseases of the nervous system and sense organs: Secondary | ICD-10-CM | POA: Insufficient documentation

## 2013-02-17 DIAGNOSIS — F411 Generalized anxiety disorder: Secondary | ICD-10-CM | POA: Insufficient documentation

## 2013-02-17 DIAGNOSIS — Z79899 Other long term (current) drug therapy: Secondary | ICD-10-CM | POA: Insufficient documentation

## 2013-02-17 DIAGNOSIS — Z87448 Personal history of other diseases of urinary system: Secondary | ICD-10-CM | POA: Insufficient documentation

## 2013-02-17 DIAGNOSIS — M545 Low back pain, unspecified: Secondary | ICD-10-CM | POA: Insufficient documentation

## 2013-02-17 DIAGNOSIS — F10929 Alcohol use, unspecified with intoxication, unspecified: Secondary | ICD-10-CM

## 2013-02-17 DIAGNOSIS — R079 Chest pain, unspecified: Secondary | ICD-10-CM

## 2013-02-17 DIAGNOSIS — G40909 Epilepsy, unspecified, not intractable, without status epilepticus: Secondary | ICD-10-CM | POA: Insufficient documentation

## 2013-02-17 DIAGNOSIS — F1410 Cocaine abuse, uncomplicated: Secondary | ICD-10-CM | POA: Insufficient documentation

## 2013-02-17 DIAGNOSIS — G8929 Other chronic pain: Secondary | ICD-10-CM | POA: Insufficient documentation

## 2013-02-17 DIAGNOSIS — Z8619 Personal history of other infectious and parasitic diseases: Secondary | ICD-10-CM | POA: Insufficient documentation

## 2013-02-17 DIAGNOSIS — F319 Bipolar disorder, unspecified: Secondary | ICD-10-CM | POA: Insufficient documentation

## 2013-02-17 DIAGNOSIS — R0602 Shortness of breath: Secondary | ICD-10-CM | POA: Insufficient documentation

## 2013-02-17 DIAGNOSIS — I10 Essential (primary) hypertension: Secondary | ICD-10-CM | POA: Insufficient documentation

## 2013-02-17 DIAGNOSIS — R45851 Suicidal ideations: Secondary | ICD-10-CM

## 2013-02-17 DIAGNOSIS — Z8719 Personal history of other diseases of the digestive system: Secondary | ICD-10-CM | POA: Insufficient documentation

## 2013-02-17 DIAGNOSIS — F1010 Alcohol abuse, uncomplicated: Secondary | ICD-10-CM | POA: Insufficient documentation

## 2013-02-17 LAB — COMPREHENSIVE METABOLIC PANEL
ALT: 59 U/L — ABNORMAL HIGH (ref 0–53)
AST: 30 U/L (ref 0–37)
Albumin: 3.5 g/dL (ref 3.5–5.2)
Alkaline Phosphatase: 69 U/L (ref 39–117)
BILIRUBIN TOTAL: 0.3 mg/dL (ref 0.3–1.2)
BUN: 12 mg/dL (ref 6–23)
CALCIUM: 8.4 mg/dL (ref 8.4–10.5)
CHLORIDE: 101 meq/L (ref 96–112)
CO2: 25 meq/L (ref 19–32)
CREATININE: 0.96 mg/dL (ref 0.50–1.35)
GLUCOSE: 103 mg/dL — AB (ref 70–99)
Potassium: 4.3 mEq/L (ref 3.7–5.3)
Sodium: 141 mEq/L (ref 137–147)
Total Protein: 7.5 g/dL (ref 6.0–8.3)

## 2013-02-17 LAB — RAPID URINE DRUG SCREEN, HOSP PERFORMED
AMPHETAMINES: NOT DETECTED
BARBITURATES: NOT DETECTED
Benzodiazepines: NOT DETECTED
Cocaine: NOT DETECTED
Opiates: NOT DETECTED
Tetrahydrocannabinol: NOT DETECTED

## 2013-02-17 LAB — CBC
HEMATOCRIT: 43.9 % (ref 39.0–52.0)
HEMOGLOBIN: 15.4 g/dL (ref 13.0–17.0)
MCH: 33.8 pg (ref 26.0–34.0)
MCHC: 35.1 g/dL (ref 30.0–36.0)
MCV: 96.5 fL (ref 78.0–100.0)
Platelets: 180 10*3/uL (ref 150–400)
RBC: 4.55 MIL/uL (ref 4.22–5.81)
RDW: 12.9 % (ref 11.5–15.5)
WBC: 6.5 10*3/uL (ref 4.0–10.5)

## 2013-02-17 LAB — SALICYLATE LEVEL

## 2013-02-17 LAB — POCT I-STAT TROPONIN I: TROPONIN I, POC: 0.01 ng/mL (ref 0.00–0.08)

## 2013-02-17 LAB — ETHANOL: Alcohol, Ethyl (B): 114 mg/dL — ABNORMAL HIGH (ref 0–11)

## 2013-02-17 LAB — ACETAMINOPHEN LEVEL: Acetaminophen (Tylenol), Serum: <15 ug/mL (ref 10–30)

## 2013-02-17 NOTE — ED Notes (Signed)
Palmer, PA is aware that the pt is having pain.

## 2013-02-17 NOTE — ED Provider Notes (Signed)
CSN: [id number]     Arrival date & time 02/17/13  2010 History  This chart was scribed for non-physician practitioner Vernie Murders, PA-C, working with Mervin Kung, MD by Zettie Pho, ED Scribe. This patient was seen in room C21C/C21C and the patient's care was started at 10:10 PM.    Chief Complaint  Patient presents with  . Suicidal   The history is provided by the patient. No language interpreter was used.   HPI Comments: Blake Arnold is a 43 y.o. male with a history of depression, bipolar disorder, anxiety, polysubstance abuse, alcohol abuse, HTN, atrial fibrillation, hepatitis C, chronic back pain, and seizures who presents to the Emergency Department complaining of intermittent suicidal ideations for the past 2-3 weeks and states that the thoughts have occurred daily for the past few days. He denies forming a plan to carry out his ideations. Patient reports a history of similar ideations and states that he sees a psychiatrist (Dr. Debbora Dus), but has not informed her of his current condition. Patient admits to being a chronic, heavy drinker and reports consuming 1 pint of vodka and 40 ounces of beer today. Patient reports a history of hospital admission for alcohol withdrawal. Patient also admits to using cocaine 2-3 weeks ago. Patient states that it feels like "there are 10,000 people talking in my head at once," but denies any auditory hallucinations that tell him to do something. Patient denies prior suicide attempts, homicidal ideations, or self-injury.   Patient is also complaining of some lower back pain that he states is chronic in nature and denies any recent changes. He reports taking a Percocet earlier today for this pain.   Patient is also complaining of some substernal chest pain onset 10-15 minutes ago while laying in bed, that he states radiated down his left arm, which is gradually improving at this time. No associated symptoms including SOB, nausea, diaphoresis, or  lightheadedness.  He states that he has experienced similar pain before secondary to his history of atrial fibrillation.  Patient is not on any anti-coagulation.  Patient reports some shortness of breath for the past few months that he states is chronic in nature and denies any recent changes. He denies abdominal pain, nausea, emesis, or fever. Patient reports a familial history of MI (mother age 47). Patient is a current, every day smoker, 1 PPD.  He has not had a cardiac evaluation in the past including stress test.      Past Medical History  Diagnosis Date  . Hepatitis C   . Chronic back pain   . A-fib   . Depression   . Anxiety   . Hepatitis C     history of  . Chronic back pain   . Acid reflux   . History of urinary frequency   . Peripheral neuropathy     hands and feet  . Hypertension   . Polysubstance abuse 01/25/2011  . Alcohol abuse   . Seizures    Past Surgical History  Procedure Laterality Date  . Cardioversion  03/07/2006   History reviewed. No pertinent family history. History  Substance Use Topics  . Smoking status: Current Every Day Smoker -- 1.00 packs/day for 25 years    Types: Cigarettes  . Smokeless tobacco: Former Systems developer    Quit date: 10/24/2012  . Alcohol Use: 0.0 oz/week     Comment: 1/5 Vodka; 1-40oz Daily     Review of Systems  Constitutional: Negative for fever.  Respiratory: Positive for shortness  of breath (chronic).   Cardiovascular: Positive for chest pain.  Gastrointestinal: Negative for nausea, vomiting and abdominal pain.  Psychiatric/Behavioral: Positive for suicidal ideas. Negative for hallucinations and self-injury.  All other systems reviewed and are negative.   Allergies  Review of patient's allergies indicates no known allergies.  Home Medications   Current Outpatient Rx  Name  Route  Sig  Dispense  Refill  . diclofenac (VOLTAREN) 50 MG EC tablet   Oral   Take 1 tablet (50 mg total) by mouth 3 (three) times daily as needed.  For joint pain and inflammation.   90 tablet   0   . FLUoxetine (PROZAC) 10 MG capsule   Oral   Take 1 capsule (10 mg total) by mouth daily. For depression.   30 capsule   0   . gabapentin (NEURONTIN) 300 MG capsule   Oral   Take 1 capsule (300 mg total) by mouth 3 (three) times daily.   90 capsule   0   . hydrOXYzine (ATARAX/VISTARIL) 25 MG tablet   Oral   Take 1 tablet (25 mg total) by mouth every 6 (six) hours as needed for anxiety (or CIWA score </= 10).   30 tablet   0   . lamoTRIgine (LAMICTAL) 25 MG tablet   Oral   Take 2 tablets (50 mg total) by mouth daily.   60 tablet   0   . lithium carbonate (LITHOBID) 300 MG CR tablet   Oral   Take 1 tablet (300 mg total) by mouth every 12 (twelve) hours.   60 tablet   0   . traZODone (DESYREL) 50 MG tablet   Oral   Take 1 tablet (50 mg total) by mouth at bedtime as needed and may repeat dose one time if needed for sleep.   30 tablet   0    Triage Vitals: BP 145/95  Pulse 115  Temp(Src) 97 F (36.1 C) (Oral)  Resp 16  SpO2 95%  Filed Vitals:   02/17/13 2353 02/18/13 0030 02/18/13 0059 02/18/13 0101  BP: 130/82 105/51 105/51 133/74  Pulse: 102 102 101   Temp:      TempSrc:      Resp: 17 21  10   SpO2: 95% 94%  96%    Physical Exam  Nursing note and vitals reviewed. Constitutional: He is oriented to person, place, and time. He appears well-developed and well-nourished. No distress.  HENT:  Head: Normocephalic and atraumatic.  Right Ear: External ear normal.  Left Ear: External ear normal.  Nose: Nose normal.  Mouth/Throat: Oropharynx is clear and moist.  Eyes: Conjunctivae are normal. Right eye exhibits no discharge. Left eye exhibits no discharge.  Neck: Normal range of motion. Neck supple.  Cardiovascular: Normal rate, regular rhythm, normal heart sounds and intact distal pulses.  Exam reveals no gallop and no friction rub.   No murmur heard. Pulmonary/Chest: Effort normal and breath sounds normal. No  respiratory distress. He has no wheezes. He has no rales. He exhibits no tenderness.  Abdominal: Soft. He exhibits no distension.  Musculoskeletal: Normal range of motion. He exhibits no edema and no tenderness.  No pedal edema or calf tenderness bilaterally.  Patient moving all extremities throughout exam.    Neurological: He is alert and oriented to person, place, and time.  Skin: Skin is warm and dry. He is not diaphoretic.  Psychiatric: His speech is normal and behavior is normal. He is not actively hallucinating. He expresses suicidal ideation. He expresses  no homicidal ideation. He expresses no suicidal plans.    ED Course  Procedures (including critical care time)  DIAGNOSTIC STUDIES: Oxygen Saturation is 95% on room air, normal by my interpretation.    COORDINATION OF CARE: 10:18 PM- Ordered blood labs, UA, and a chest x-ray. Discussed treatment plan with patient at bedside and patient verbalized agreement.    Labs Review Labs Reviewed  COMPREHENSIVE METABOLIC PANEL - Abnormal; Notable for the following:    Glucose, Bld 103 (*)    ALT 59 (*)    All other components within normal limits  ETHANOL - Abnormal; Notable for the following:    Alcohol, Ethyl (B) 114 (*)    All other components within normal limits  SALICYLATE LEVEL - Abnormal; Notable for the following:    Salicylate Lvl 123456 (*)    All other components within normal limits  CBC  ACETAMINOPHEN LEVEL  URINE RAPID DRUG SCREEN (HOSP PERFORMED)  POCT I-STAT TROPONIN I   Imaging Review Dg Chest 2 View  02/17/2013   CLINICAL DATA:  Suicidal  EXAM: CHEST  2 VIEW  COMPARISON:  01/22/2011  FINDINGS: Normal heart size. Clear lungs. No pleural effusion and no pneumothorax.  IMPRESSION: No active cardiopulmonary disease.   Electronically Signed   By: Maryclare Bean M.D.   On: 02/17/2013 20:58    EKG Interpretation    Date/Time:  Wednesday February 17 2013 20:25:28 EST Ventricular Rate:  115 PR Interval:  172 QRS  Duration: 90 QT Interval:  330 QTC Calculation: 456 R Axis:   59 Text Interpretation:  Sinus tachycardia Septal infarct , age undetermined Abnormal ECG No significant change since last tracing Confirmed by ZACKOWSKI  MD, SCOTT ([id number]) on 02/18/2013 1:27:58 AM           Results for orders placed during the hospital encounter of 02/17/13  CBC      Result Value Range   WBC 6.5  4.0 - 10.5 K/uL   RBC 4.55  4.22 - 5.81 MIL/uL   Hemoglobin 15.4  13.0 - 17.0 g/dL   HCT 43.9  39.0 - 52.0 %   MCV 96.5  78.0 - 100.0 fL   MCH 33.8  26.0 - 34.0 pg   MCHC 35.1  30.0 - 36.0 g/dL   RDW 12.9  11.5 - 15.5 %   Platelets 180  150 - 400 K/uL  COMPREHENSIVE METABOLIC PANEL      Result Value Range   Sodium 141  137 - 147 mEq/L   Potassium 4.3  3.7 - 5.3 mEq/L   Chloride 101  96 - 112 mEq/L   CO2 25  19 - 32 mEq/L   Glucose, Bld 103 (*) 70 - 99 mg/dL   BUN 12  6 - 23 mg/dL   Creatinine, Ser 0.96  0.50 - 1.35 mg/dL   Calcium 8.4  8.4 - 10.5 mg/dL   Total Protein 7.5  6.0 - 8.3 g/dL   Albumin 3.5  3.5 - 5.2 g/dL   AST 30  0 - 37 U/L   ALT 59 (*) 0 - 53 U/L   Alkaline Phosphatase 69  39 - 117 U/L   Total Bilirubin 0.3  0.3 - 1.2 mg/dL   GFR calc non Af Amer >90  >90 mL/min   GFR calc Af Amer >90  >90 mL/min  ETHANOL      Result Value Range   Alcohol, Ethyl (B) 114 (*) 0 - 11 mg/dL  ACETAMINOPHEN LEVEL  Result Value Range   Acetaminophen (Tylenol), Serum <15.0  10 - 30 ug/mL  SALICYLATE LEVEL      Result Value Range   Salicylate Lvl 123456 (*) 2.8 - 20.0 mg/dL  URINE RAPID DRUG SCREEN (HOSP PERFORMED)      Result Value Range   Opiates NONE DETECTED  NONE DETECTED   Cocaine NONE DETECTED  NONE DETECTED   Benzodiazepines NONE DETECTED  NONE DETECTED   Amphetamines NONE DETECTED  NONE DETECTED   Tetrahydrocannabinol NONE DETECTED  NONE DETECTED   Barbiturates NONE DETECTED  NONE DETECTED  TROPONIN I      Result Value Range   Troponin I <0.30  <0.30 ng/mL  POCT I-STAT TROPONIN I       Result Value Range   Troponin i, poc 0.01  0.00 - 0.08 ng/mL   Comment 3           POCT I-STAT TROPONIN I      Result Value Range   Troponin i, poc 0.00  0.00 - 0.08 ng/mL   Comment 3           POCT I-STAT TROPONIN I      Result Value Range   Troponin i, poc 0.01  0.00 - 0.08 ng/mL   Comment 3                DG Chest 2 View (Final result)  Result time: 02/17/13 20:58:44    Final result by Rad Results In Interface (02/17/13 20:58:44)    Narrative:   CLINICAL DATA: Suicidal  EXAM: CHEST 2 VIEW  COMPARISON: 01/22/2011  FINDINGS: Normal heart size. Clear lungs. No pleural effusion and no pneumothorax.  IMPRESSION: No active cardiopulmonary disease.   Electronically Signed By: Maryclare Bean M.D. On: 02/17/2013 20:58         MDM   Blake Arnold is a 43 y.o. male with a history of depression, bipolar disorder, anxiety, polysubstance abuse, alcohol abuse, HTN, atrial fibrillation, hepatitis C, chronic back pain, and seizures who presents to the Emergency Department complaining of intermittent suicidal ideations for the past 2-3 weeks and states that the thoughts have occurred daily for the past few days.   Rechecks  1:45 AM = Patient sleeping when I entered the room.  Chest pain began once patient brought back to Pod C (approximately 10:00 PM).  Patient denies any chest pain prior to coming to the ED.  Spoke with RN staff about why a troponin, EKG, and chest x-ray was ordered.  Patient was allegedly complaining of SOB at triage.  Patient denies any SOB currently and states he has chronic SOB with no acute changes.  Patient's CP lasted approximately 20-30 min and has no completely resolved.  Patient also has had multiple other complaints throughout his ED visit to myself and RN staff including headache and back pain.       Patient evaluated in the ED for SI without plan.  ACT team consulted for further evaluation and management.  Patient found to be intoxicated with a BAL of  114.  Patient also has had multiple changing complaints throughout his ED visit including chest pain, SOB, back pain, and headache.  Patient's story is changing between providers.  Patient denied any SOB upon my examinations.  He states his back pain is chronic with no acute changes.  He complained of a headache on RN notes but did not endorse this to me.  He complained of chest pain to me which started in the  ED.  EKG negative for any acute ischemic changes.  Chest x-ray negative for an acute cardiopulmonary process.  Troponin negative x 2.  Will order one more troponin at 5 am and re-evaluate.  Patient placed on cardiac monitor.     Signed out care to [facility] PA-C who will await troponin results.     Final impressions: 1. Suicidal ideation   2. Alcohol intoxication   3. Chest pain      Mercy Moore PA-C   This patient was discussed with Dr. Huel Cote, PA-C 02/18/13 1151

## 2013-02-17 NOTE — ED Notes (Addendum)
Presents with 2-3 weeks of suicidal thoughts, denies plan, HX of same, sees a psychiatristt, Granville Lewis. Denies thought of harming others. Admits to ETOH use today and uses every other day. Drinks 2-3 40 ounces every other day.  Denies use of cocaine for the past 2 weeks. C/o SOB, HR 115-120, sats 96% RA

## 2013-02-18 ENCOUNTER — Encounter (HOSPITAL_COMMUNITY): Payer: Self-pay | Admitting: *Deleted

## 2013-02-18 ENCOUNTER — Inpatient Hospital Stay (HOSPITAL_COMMUNITY)
Admission: EM | Admit: 2013-02-18 | Discharge: 2013-02-22 | DRG: 897 | Disposition: A | Discharge status: Home / Self Care | Payer: No Typology Code available for payment source | Source: Intra-hospital | Attending: Psychiatry | Admitting: Psychiatry

## 2013-02-18 ENCOUNTER — Inpatient Hospital Stay (HOSPITAL_COMMUNITY): Admission: EM | Admit: 2013-02-18 | Payer: Self-pay | Source: Intra-hospital | Admitting: Psychiatry

## 2013-02-18 DIAGNOSIS — M549 Dorsalgia, unspecified: Secondary | ICD-10-CM | POA: Diagnosis present

## 2013-02-18 DIAGNOSIS — I10 Essential (primary) hypertension: Secondary | ICD-10-CM | POA: Diagnosis present

## 2013-02-18 DIAGNOSIS — F10239 Alcohol dependence with withdrawal, unspecified: Secondary | ICD-10-CM

## 2013-02-18 DIAGNOSIS — I4891 Unspecified atrial fibrillation: Secondary | ICD-10-CM | POA: Diagnosis present

## 2013-02-18 DIAGNOSIS — Z23 Encounter for immunization: Secondary | ICD-10-CM

## 2013-02-18 DIAGNOSIS — F10939 Alcohol use, unspecified with withdrawal, unspecified: Secondary | ICD-10-CM

## 2013-02-18 DIAGNOSIS — F411 Generalized anxiety disorder: Secondary | ICD-10-CM | POA: Diagnosis present

## 2013-02-18 DIAGNOSIS — F1010 Alcohol abuse, uncomplicated: Secondary | ICD-10-CM

## 2013-02-18 DIAGNOSIS — F17200 Nicotine dependence, unspecified, uncomplicated: Secondary | ICD-10-CM | POA: Diagnosis present

## 2013-02-18 DIAGNOSIS — F1910 Other psychoactive substance abuse, uncomplicated: Secondary | ICD-10-CM

## 2013-02-18 DIAGNOSIS — R569 Unspecified convulsions: Secondary | ICD-10-CM | POA: Diagnosis present

## 2013-02-18 DIAGNOSIS — G8929 Other chronic pain: Secondary | ICD-10-CM | POA: Diagnosis present

## 2013-02-18 DIAGNOSIS — K219 Gastro-esophageal reflux disease without esophagitis: Secondary | ICD-10-CM | POA: Diagnosis present

## 2013-02-18 DIAGNOSIS — G609 Hereditary and idiopathic neuropathy, unspecified: Secondary | ICD-10-CM | POA: Diagnosis present

## 2013-02-18 DIAGNOSIS — F329 Major depressive disorder, single episode, unspecified: Secondary | ICD-10-CM

## 2013-02-18 DIAGNOSIS — F332 Major depressive disorder, recurrent severe without psychotic features: Secondary | ICD-10-CM | POA: Diagnosis present

## 2013-02-18 DIAGNOSIS — F1020 Alcohol dependence, uncomplicated: Principal | ICD-10-CM | POA: Diagnosis present

## 2013-02-18 DIAGNOSIS — F319 Bipolar disorder, unspecified: Secondary | ICD-10-CM | POA: Diagnosis present

## 2013-02-18 DIAGNOSIS — B1920 Unspecified viral hepatitis C without hepatic coma: Secondary | ICD-10-CM | POA: Diagnosis present

## 2013-02-18 DIAGNOSIS — R45851 Suicidal ideations: Secondary | ICD-10-CM

## 2013-02-18 DIAGNOSIS — G4700 Insomnia, unspecified: Secondary | ICD-10-CM | POA: Diagnosis present

## 2013-02-18 LAB — POCT I-STAT TROPONIN I
TROPONIN I, POC: 0.01 ng/mL (ref 0.00–0.08)
Troponin i, poc: 0 ng/mL (ref 0.00–0.08)

## 2013-02-18 LAB — TROPONIN I: Troponin I: <0.3 ng/mL (ref <0.30)

## 2013-02-18 MED ORDER — LORAZEPAM 2 MG/ML IJ SOLN: 0.0000 mg | Freq: Four times a day (QID) | INTRAMUSCULAR | Status: DC

## 2013-02-18 MED ORDER — THIAMINE HCL 100 MG/ML IJ SOLN
100.0000 mg | Freq: Every day | INTRAMUSCULAR | Status: DC
Start: 2013-02-18 — End: 2013-02-18

## 2013-02-18 MED ORDER — ALUM & MAG HYDROXIDE-SIMETH 200-200-20 MG/5ML PO SUSP: 30.0000 mL | ORAL | Status: DC | PRN

## 2013-02-18 MED ORDER — ACETAMINOPHEN 325 MG PO TABS: 650.0000 mg | ORAL_TABLET | Freq: Four times a day (QID) | ORAL | Status: DC | PRN

## 2013-02-18 MED ORDER — TRAZODONE HCL 100 MG PO TABS
100.0000 mg | ORAL_TABLET | Freq: Every day | ORAL | Status: DC
  Administered 2013-02-18 – 2013-02-21 (×4): 100 mg via ORAL
  Filled 2013-02-18: qty 1
  Filled 2013-02-18: qty 14
  Filled 2013-02-18 (×4): qty 1

## 2013-02-18 MED ORDER — RISPERIDONE 2 MG PO TABS
2.0000 mg | ORAL_TABLET | Freq: Every day | ORAL | Status: DC
  Administered 2013-02-18 – 2013-02-21 (×4): 2 mg via ORAL
  Filled 2013-02-18: qty 1
  Filled 2013-02-18: qty 14
  Filled 2013-02-18 (×4): qty 1

## 2013-02-18 MED ORDER — VITAMIN B-1 100 MG PO TABS
100.0000 mg | ORAL_TABLET | Freq: Every day | ORAL | Status: DC
Start: 2013-02-18 — End: 2013-02-18

## 2013-02-18 MED ORDER — DICLOFENAC SODIUM 25 MG PO TBEC: 50.0000 mg | DELAYED_RELEASE_TABLET | Freq: Three times a day (TID) | ORAL | Status: DC | PRN

## 2013-02-18 MED ORDER — HYDROXYZINE HCL 25 MG PO TABS: 25.0000 mg | ORAL_TABLET | Freq: Four times a day (QID) | ORAL | Status: DC | PRN

## 2013-02-18 MED ORDER — MAGNESIUM HYDROXIDE 400 MG/5ML PO SUSP: 30.0000 mL | Freq: Every day | ORAL | Status: DC | PRN

## 2013-02-18 MED ORDER — LORAZEPAM 2 MG/ML IJ SOLN: 0.0000 mg | Freq: Two times a day (BID) | INTRAMUSCULAR | Status: DC

## 2013-02-18 MED ORDER — GABAPENTIN 300 MG PO CAPS
300.0000 mg | ORAL_CAPSULE | Freq: Two times a day (BID) | ORAL | Status: DC
  Administered 2013-02-18 – 2013-02-22 (×7): 300 mg via ORAL
  Filled 2013-02-18 (×3): qty 1
  Filled 2013-02-18: qty 28
  Filled 2013-02-18 (×6): qty 1
  Filled 2013-02-18: qty 28
  Filled 2013-02-18: qty 1

## 2013-02-18 MED ORDER — ACAMPROSATE CALCIUM 333 MG PO TBEC
666.0000 mg | DELAYED_RELEASE_TABLET | Freq: Three times a day (TID) | ORAL | Status: DC
  Administered 2013-02-18 – 2013-02-22 (×11): 666 mg via ORAL
  Filled 2013-02-18 (×2): qty 2
  Filled 2013-02-18: qty 84
  Filled 2013-02-18 (×7): qty 2
  Filled 2013-02-18: qty 84
  Filled 2013-02-18 (×2): qty 2
  Filled 2013-02-18: qty 84
  Filled 2013-02-18 (×4): qty 2

## 2013-02-18 MED ORDER — CHLORDIAZEPOXIDE HCL 25 MG PO CAPS
25.0000 mg | ORAL_CAPSULE | Freq: Three times a day (TID) | ORAL | Status: DC | PRN
  Administered 2013-02-21: 25 mg via ORAL
  Filled 2013-02-18: qty 1

## 2013-02-18 NOTE — Progress Notes (Signed)
D: pt in bed. Attended nightly group. Pt is calm and cooperative. Denies si/hi/avh. Denies pain. No signs of distress or further complaints at this time A: q 15 min safety checks. R: pt remains safe on unit

## 2013-02-18 NOTE — ED Provider Notes (Signed)
Patient accepted to behavioral health hospital by Dr. Sabra Heck. Denies any chest pain currently. Notably he had complained of chest pain last night which was evaluated by Dr. Rogene Houston. As per his note, patient is medically clear after his 5 AM troponin this morning. It is negative.  BP 113/72  Pulse 83  Temp(Src) 97 F (36.1 C) (Oral)  Resp 18  SpO2 93%    Blake Essex, MD 02/18/13 [phone]

## 2013-02-18 NOTE — BH Assessment (Signed)
Tele Assessment Note   Blake Arnold is an 43 y.o. male.  -Clinician called and talked to [place] [facility] [place] [facility].  Patient was seen by previous PA and presents with depression and thoughts of killing himself.  No plan.  Also drinks heavily.  Patient said that he has a lot of conflict and turmoil with his siblings.  He does not agree with their lifestyle and this causes him a lot of stress.  Patient lives with his parents and drinks steadily.  Patient admits to thoughts of killing himself for the last two weeks.  He has no plan at this time but is upset about his depression getting to the point of having SI.  No HI or A/V hallucinations.  Patient has been drinking 1 pint and a 40 oz beer whenever he drinks.  He used to drink daily but said taht over the the last month he has been drinking every other day.  His last drink was 01/07 and was a pint and a 40.  Patient has had muliple admissions to [facility] and has been going to outpatient services at [facility].  Dr. Magda Paganini O'Niell is his psychiatrist.  -Clinician talked to Patriciaann Clan, [place] and he accepted patient to [facility] to the services of Dr. Sabra Heck.  Clinician called Dr. Wyvonnia Dusky at [facility] and informed him.  Nurse Vicente Males was informed at 04:10 that patient had been accepted, clinician faxed acceptance paperwork to her.  Also let her know that [facility] P[facility] would need to transport.  Axis I: Depressive Disorder NOS and 303.90 ETOH use d/o severe Axis II: Deferred Axis III:  Past Medical History  Diagnosis Date  . Hepatitis C   . Chronic back pain   . A-fib   . Depression   . Anxiety   . Hepatitis C     history of  . Chronic back pain   . Acid reflux   . History of urinary frequency   . Peripheral neuropathy     hands and feet  . Hypertension   . Polysubstance abuse 01/25/2011  . Alcohol abuse   . Seizures    Axis IV: economic problems, occupational problems, other psychosocial or environmental problems and problems related to  social environment Axis V: 31-40 impairment in reality testing  Past Medical History:  Past Medical History  Diagnosis Date  . Hepatitis C   . Chronic back pain   . A-fib   . Depression   . Anxiety   . Hepatitis C     history of  . Chronic back pain   . Acid reflux   . History of urinary frequency   . Peripheral neuropathy     hands and feet  . Hypertension   . Polysubstance abuse 01/25/2011  . Alcohol abuse   . Seizures     Past Surgical History  Procedure Laterality Date  . Cardioversion  03/07/2006    Family History: History reviewed. No pertinent family history.  Social History:  reports that he has been smoking Cigarettes.  He has a 25 pack-year smoking history. He quit smokeless tobacco use about 3 months ago. He reports that he drinks alcohol. He reports that he uses illicit drugs ("Crack" cocaine and Cocaine).  Additional Social History:  Alcohol / Drug Use Pain Medications: See PTA medication list Prescriptions: See PTA medication list Over the Counter: See PTA medication list History of alcohol / drug use?: Yes Withdrawal Symptoms: Diarrhea;Tremors;Weakness;Nausea / Vomiting;Cramps;Fever / Chills;Sweats;Seizures Onset of Seizures: Detox  Date of most recent seizure: 2 years ago Substance #1 Name of Substance 1: ETOH.  Usually liquor and beer 1 - Age of First Use: 43 years of age 40 - Amount (size/oz): A pint and a 40 oz 1 - Frequency: Every other day 1 - Duration: Last month or so at that rate.  Previously daily. 1 - Last Use / Amount: 01/07 drank a pint and a 40  CIWA: CIWA-Ar BP: 113/68 mmHg Pulse Rate: 92 Nausea and Vomiting: 2 Tactile Disturbances: very mild itching, pins and needles, burning or numbness (Pt reports neuropathy.) Tremor: no tremor Auditory Disturbances: not present Paroxysmal Sweats: no sweat visible Visual Disturbances: not present Anxiety: no anxiety, at ease Headache, Fullness in Head: none present Agitation: normal  activity Orientation and Clouding of Sensorium: oriented and can do serial additions CIWA-Ar Total: 3 COWS:    Allergies: No Known Allergies  Home Medications:  (Not in a hospital admission)  OB/GYN Status:  No LMP for male patient.  General Assessment Data Location of Assessment: [facility] ED Is this a Tele or Face-to-Face Assessment?: Tele Assessment Is this an Initial Assessment or a Re-assessment for this encounter?: Initial Assessment Living Arrangements: Parent Can pt return to current living arrangement?: Yes Admission Status: Voluntary Is patient capable of signing voluntary admission?: Yes Transfer from: [facility] Referral Source: Self/Family/Friend     [place] Living Arrangements: Parent Name of Psychiatrist: Dr. Jill Poling at [facility] Name of Therapist: [place] to self Suicidal Ideation: Yes-Currently Present Suicidal Intent: Yes-Currently Present Is patient at risk for suicide?: Yes Suicidal Plan?: No Access to Means: No What has been your use of drugs/alcohol within the last 12 months?: ETOH use Previous Attempts/Gestures: No How many times?: 0 Other Self Harm Risks: SA use Triggers for Past Attempts: None known Intentional Self Injurious Behavior: None Family Suicide History: No Recent stressful life event(s): Turmoil (Comment) (Conflict or turmoil with siblings) Persecutory voices/beliefs?: Yes Depression: Yes Depression Symptoms: Despondent;Isolating;Loss of interest in usual pleasures;Feeling angry/irritable Substance abuse history and/or treatment for substance abuse?: Yes Suicide prevention information given to non-admitted patients: Not applicable  Risk to Others Homicidal Ideation: No Thoughts of Harm to Others: No Current Homicidal Intent: No Current Homicidal Plan: No Access to Homicidal Means: No Identified Victim: No one History of harm to others?: No Assessment of Violence:  None Noted Violent Behavior Description: N/A Does patient have access to weapons?: No Criminal Charges Pending?: No Does patient have a court date: No  Psychosis Hallucinations: None noted Delusions: None noted  Mental Status Report Appear/Hygiene: Disheveled Eye Contact: Good Motor Activity: Freedom of movement;Unremarkable Speech: Logical/coherent Level of Consciousness: Alert Mood: Depressed;Helpless Affect: Depressed;Sad;Anxious Anxiety Level: Severe Thought Processes: Coherent;Relevant Judgement: Unimpaired Orientation: Person;Place;Time;Situation Obsessive Compulsive Thoughts/Behaviors: Minimal  Cognitive Functioning Concentration: Decreased Memory: Recent Impaired;Remote Intact IQ: Average Insight: Fair Impulse Control: Poor Appetite: Good Weight Loss: 0 Weight Gain: 0 Sleep: No Change Total Hours of Sleep: 5 Vegetative Symptoms: Staying in bed  ADLScreening [facility] Assessment Services) Patient's cognitive ability adequate to safely complete daily activities?: Yes Patient able to express need for assistance with ADLs?: Yes Independently performs ADLs?: Yes (appropriate for developmental age)  Prior Inpatient Therapy Prior Inpatient Therapy: Yes Prior Therapy Dates: 2014 admissions Prior Therapy Facilty/Provider(s): [facility] Reason for Treatment: ETOH, depression  Prior Outpatient Therapy Prior Outpatient Therapy: Yes Prior Therapy Dates: Last 6 months Prior Therapy Facilty/Provider(s): [facility] Reason for Treatment: Med  managment & counseling  ADL Screening (condition at time of admission) Patient's cognitive ability adequate to safely complete daily activities?: Yes Is the patient deaf or have difficulty hearing?: No Does the patient have difficulty seeing, even when wearing glasses/contacts?: No Does the patient have difficulty concentrating, remembering, or making decisions?: Yes Patient able to express need for assistance with  ADLs?: Yes Does the patient have difficulty dressing or bathing?: No Independently performs ADLs?: Yes (appropriate for developmental age) Does the patient have difficulty walking or climbing stairs?: No (Goes slowly because of knee pain) Weakness of Legs: None Weakness of Arms/Hands: None       Abuse/Neglect Assessment (Assessment to be complete while patient is alone) Physical Abuse: Denies Verbal Abuse: Denies Sexual Abuse: Denies Exploitation of patient/patient's resources: Denies Self-Neglect: Denies Values / Beliefs Cultural Requests During Hospitalization: None Spiritual Requests During Hospitalization: None   Advance Directives (For Healthcare) Advance Directive: Patient does not have advance directive;Patient would not like information    Additional Information 1:1 In Past 12 Months?: No CIRT Risk: No Elopement Risk: No Does patient have medical clearance?: Yes     Disposition:  Disposition Initial Assessment Completed for this Encounter: Yes Disposition of Patient: Inpatient treatment program;Referred to Type of inpatient treatment program: Adult Patient referred to:  (Pt accepted to [facility] by Frederico Hamman to Dr. Sabra Heck.Rm [id number])  Curlene Dolphin Ray 02/18/2013 5:25 AM

## 2013-02-18 NOTE — Tx Team (Signed)
Initial Interdisciplinary Treatment Plan  PATIENT STRENGTHS: (choose at least two) Capable of independent living Communication skills General fund of knowledge Motivation for treatment/growth Physical Health Supportive family/friends  PATIENT STRESSORS: Financial difficulties Health problems Marital or family conflict Medication change or noncompliance Occupational concerns Substance abuse   PROBLEM LIST: Problem List/Patient Goals Date to be addressed Date deferred Reason deferred Estimated date of resolution  Substance abuse 02/18/2013   D/c        Suicidal ideation 02/18/2013   D/c        depression 02/18/2013   D/c                           DISCHARGE CRITERIA:  Ability to meet basic life and health needs Improved stabilization in mood, thinking, and/or behavior Medical problems require only outpatient monitoring Motivation to continue treatment in a less acute level of care Need for constant or close observation no longer present Reduction of life-threatening or endangering symptoms to within safe limits Safe-care adequate arrangements made Verbal commitment to aftercare and medication compliance Withdrawal symptoms are absent or subacute and managed without 24-hour nursing intervention  PRELIMINARY DISCHARGE PLAN: Attend aftercare/continuing care group Attend PHP/IOP Attend 12-step recovery group Outpatient therapy Participate in family therapy Return to previous living arrangement  PATIENT/FAMIILY INVOLVEMENT: This treatment plan has been presented to and reviewed with the patient, Blake Arnold.  The patient and family have been given the opportunity to ask questions and make suggestions.  Grayland Ormond [facility] 02/18/2013, 10:09 AM

## 2013-02-18 NOTE — ED Notes (Signed)
Sitter at bedside.

## 2013-02-18 NOTE — ED Provider Notes (Signed)
Medical screening examination/treatment/procedure(s) were conducted as a shared visit with non-physician practitioner(s) and myself.  I personally evaluated the patient during the encounter.  EKG Interpretation    Date/Time:  Wednesday February 17 2013 20:25:28 EST Ventricular Rate:  115 PR Interval:  172 QRS Duration: 90 QT Interval:  330 QTC Calculation: 456 R Axis:   59 Text Interpretation:  Sinus tachycardia Septal infarct , age undetermined Abnormal ECG No significant change since last tracing Confirmed by Noura Purpura  MD, Kyng Matlock ([id number]) on 02/18/2013 1:27:58 AM           Results for orders placed during the hospital encounter of 02/17/13  CBC      Result Value Range   WBC 6.5  4.0 - 10.5 K/uL   RBC 4.55  4.22 - 5.81 MIL/uL   Hemoglobin 15.4  13.0 - 17.0 g/dL   HCT 43.9  39.0 - 52.0 %   MCV 96.5  78.0 - 100.0 fL   MCH 33.8  26.0 - 34.0 pg   MCHC 35.1  30.0 - 36.0 g/dL   RDW 12.9  11.5 - 15.5 %   Platelets 180  150 - 400 K/uL  COMPREHENSIVE METABOLIC PANEL      Result Value Range   Sodium 141  137 - 147 mEq/L   Potassium 4.3  3.7 - 5.3 mEq/L   Chloride 101  96 - 112 mEq/L   CO2 25  19 - 32 mEq/L   Glucose, Bld 103 (*) 70 - 99 mg/dL   BUN 12  6 - 23 mg/dL   Creatinine, Ser 0.96  0.50 - 1.35 mg/dL   Calcium 8.4  8.4 - 10.5 mg/dL   Total Protein 7.5  6.0 - 8.3 g/dL   Albumin 3.5  3.5 - 5.2 g/dL   AST 30  0 - 37 U/L   ALT 59 (*) 0 - 53 U/L   Alkaline Phosphatase 69  39 - 117 U/L   Total Bilirubin 0.3  0.3 - 1.2 mg/dL   GFR calc non Af Amer >90  >90 mL/min   GFR calc Af Amer >90  >90 mL/min  ETHANOL      Result Value Range   Alcohol, Ethyl (B) 114 (*) 0 - 11 mg/dL  ACETAMINOPHEN LEVEL      Result Value Range   Acetaminophen (Tylenol), Serum <15.0  10 - 30 ug/mL  SALICYLATE LEVEL      Result Value Range   Salicylate Lvl <1.9 (*) 2.8 - 20.0 mg/dL  URINE RAPID DRUG SCREEN (HOSP PERFORMED)      Result Value Range   Opiates NONE DETECTED  NONE DETECTED   Cocaine NONE  DETECTED  NONE DETECTED   Benzodiazepines NONE DETECTED  NONE DETECTED   Amphetamines NONE DETECTED  NONE DETECTED   Tetrahydrocannabinol NONE DETECTED  NONE DETECTED   Barbiturates NONE DETECTED  NONE DETECTED  POCT I-STAT TROPONIN I      Result Value Range   Troponin i, poc 0.01  0.00 - 0.08 ng/mL   Comment 3           POCT I-STAT TROPONIN I      Result Value Range   Troponin i, poc 0.00  0.00 - 0.08 ng/mL   Comment 3            Results for orders placed during the hospital encounter of 02/17/13  CBC      Result Value Range   WBC 6.5  4.0 - 10.5 K/uL   RBC  4.55  4.22 - 5.81 MIL/uL   Hemoglobin 15.4  13.0 - 17.0 g/dL   HCT 43.9  39.0 - 52.0 %   MCV 96.5  78.0 - 100.0 fL   MCH 33.8  26.0 - 34.0 pg   MCHC 35.1  30.0 - 36.0 g/dL   RDW 12.9  11.5 - 15.5 %   Platelets 180  150 - 400 K/uL  COMPREHENSIVE METABOLIC PANEL      Result Value Range   Sodium 141  137 - 147 mEq/L   Potassium 4.3  3.7 - 5.3 mEq/L   Chloride 101  96 - 112 mEq/L   CO2 25  19 - 32 mEq/L   Glucose, Bld 103 (*) 70 - 99 mg/dL   BUN 12  6 - 23 mg/dL   Creatinine, Ser 0.96  0.50 - 1.35 mg/dL   Calcium 8.4  8.4 - 10.5 mg/dL   Total Protein 7.5  6.0 - 8.3 g/dL   Albumin 3.5  3.5 - 5.2 g/dL   AST 30  0 - 37 U/L   ALT 59 (*) 0 - 53 U/L   Alkaline Phosphatase 69  39 - 117 U/L   Total Bilirubin 0.3  0.3 - 1.2 mg/dL   GFR calc non Af Amer >90  >90 mL/min   GFR calc Af Amer >90  >90 mL/min  ETHANOL      Result Value Range   Alcohol, Ethyl (B) 114 (*) 0 - 11 mg/dL  ACETAMINOPHEN LEVEL      Result Value Range   Acetaminophen (Tylenol), Serum <15.0  10 - 30 ug/mL  SALICYLATE LEVEL      Result Value Range   Salicylate Lvl <8.1 (*) 2.8 - 20.0 mg/dL  URINE RAPID DRUG SCREEN (HOSP PERFORMED)      Result Value Range   Opiates NONE DETECTED  NONE DETECTED   Cocaine NONE DETECTED  NONE DETECTED   Benzodiazepines NONE DETECTED  NONE DETECTED   Amphetamines NONE DETECTED  NONE DETECTED   Tetrahydrocannabinol NONE  DETECTED  NONE DETECTED   Barbiturates NONE DETECTED  NONE DETECTED  POCT I-STAT TROPONIN I      Result Value Range   Troponin i, poc 0.01  0.00 - 0.08 ng/mL   Comment 3           POCT I-STAT TROPONIN I      Result Value Range   Troponin i, poc 0.00  0.00 - 0.08 ng/mL   Comment 3            Dg Chest 2 View  02/17/2013   CLINICAL DATA:  Suicidal  EXAM: CHEST  2 VIEW  COMPARISON:  01/22/2011  FINDINGS: Normal heart size. Clear lungs. No pleural effusion and no pneumothorax.  IMPRESSION: No active cardiopulmonary disease.   Electronically Signed   By: Maryclare Bean M.D.   On: 02/17/2013 20:58    Patient presented for 2-3 weeks his suicidal thoughts. Denied a plan. Somewhere around 10:00 to 10:30 he developed chest pain that lasted about 30 minutes it was approximately from 10:00 in the EEG to 10:30. Then it went away spontaneously. Initial workup for this has been negative however to completely medically clear him he will need another troponin done at 5 in the morning if that is negative then the patient is medically cleared and disposition can be asked her behavioral health. Patient we moved to see Sherren Mocha in the meantime. Patient chest x-rays negative. Patient does have a history of depression and  previous behavioral health admissions as well as alcohol abuse. Patient's alcohol level is 114 potentially intoxicated.  Mervin Kung, MD 02/18/13 [phone]

## 2013-02-18 NOTE — [facility] Group Notes (Signed)
[place] LCSW Group Therapy  02/18/2013 3:11 PM  Type of Therapy:  Group Therapy  Participation Level:  Did Not Attend-Pt sleeping in room. CSW also unable to meet with pt today due to extreme drowsiness of pt. CSW to speak with pt tomorrow and will assess for appropriate referrals.   Smart, Blake Arnold LCSWA  02/18/2013, 3:11 PM

## 2013-02-18 NOTE — [facility] Suicide Risk Assessment (Signed)
Suicide Risk Assessment  Admission Assessment     Nursing information obtained from:  Patient Demographic factors:  Male;Caucasian;Low socioeconomic status;Unemployed Current Mental Status:  Suicidal ideation indicated by patient Loss Factors:  Decrease in vocational status;Decline in physical health;Financial problems / change in socioeconomic status Historical Factors:  Family history of mental illness or substance abuse;Impulsivity Risk Reduction Factors:  Living with another person, especially a relative  CLINICAL FACTORS:   Depression:   Comorbid alcohol abuse/dependence Alcohol/Substance Abuse/Dependencies  COGNITIVE FEATURES THAT CONTRIBUTE TO RISK:  Closed-mindedness Polarized thinking Thought constriction (tunnel vision)    SUICIDE RISK:   Moderate:  Frequent suicidal ideation with limited intensity, and duration, some specificity in terms of plans, no associated intent, good self-control, limited dysphoria/symptomatology, some risk factors present, and identifiable protective factors, including available and accessible social support.  PLAN OF CARE: Supportive approach/coping skills/relapse prevention                              Detox if necessary                              Identify and address the co morbidities  I certify that inpatient services furnished can reasonably be expected to improve the patient's condition.  [place] A 02/18/2013, 5:23 PM

## 2013-02-18 NOTE — Progress Notes (Signed)
Patient ID: Blake Arnold, male   DOB: Oct 17, 1970, 43 y.o.   MRN: [id number] Pt did not attend psycho-educational group on overcoming stress.

## 2013-02-18 NOTE — ED Notes (Signed)
Pt has been accepted at [facility].

## 2013-02-18 NOTE — H&P (Signed)
Psychiatric Admission Assessment Adult  Patient Identification:  Blake Arnold  Date of Evaluation:  02/18/2013  Chief Complaint:  Alcohol Dependence  History of Present Illness: This is yet another admission assessment for this 43 year old Caucasian male. Admitted to [facility] from the [facility] ED with complaints of suicidal ideations and increased alcohol consumption. Patient reports, "I took the bus to get to the [facility] ED yesterday. I was having suicidal thoughts, a lot of anxiety build-up. I have no idea why this started in the first place for me. The only reason that I can give is that I'm living with my mother and step-father. The problem is not with these 2, rather their place is full of other relatives who are getting on my nerves. I was in this hospital last October, 2014 because of suicidal ideation and increased alcohol consumption. I needed detoxification treatment that time too. I have been drinking quite a bit, but no drug use. I was drinking a pint of Vodka in 2 days. I drink out of habit. I have been doing this for 2-3 weeks. My suicidal ideations started around thanksgiving period. I did not attempt suicide, and I have no history of suicide attempts. I have had one year sobriety from alcoholism in 2012-2013. That time, I was in prison for arm robbery and larceny".  Elements:  Location:  [facility] adult unit. Quality:  High anxiety, mood swings, increased alcohol consumption. Severity:  Moderate. Timing:  Started 3 weeks ago. Duration:  Chronic. Context:  Living with my folks is stressful, anytime I reconnect with my old lady, it is bad news for me, these are my reasons for relapsing.  Associated Signs/Synptoms:  Depression Symptoms:  feelings of worthlessness/guilt, anxiety, insomnia,  (Hypo) Manic Symptoms:  Distractibility, Impulsivity, Irritable Mood, Labiality of Mood,  Anxiety Symptoms:  Excessive Worry,  Psychotic Symptoms:  Hallucinations:  None  PTSD Symptoms: Had a traumatic exposure:  Denies any recent/remote traumatic event   Psychiatric Specialty Exam: Physical Exam  Constitutional: He is oriented to person, place, and time. He appears well-developed.  HENT:  Head: Normocephalic.  Eyes: Pupils are equal, round, and reactive to light.  Neck: Normal range of motion.  Cardiovascular: Normal rate.   Respiratory: Effort normal.  GI: Soft.  Genitourinary:  Did nor assess   Musculoskeletal: Normal range of motion.  Neurological: He is alert and oriented to person, place, and time.  Skin: Skin is warm and dry.  Psychiatric: His speech is normal and behavior is normal. Thought content normal. His mood appears anxious (Rated #4). Cognition and memory are normal. He expresses impulsivity. He does not exhibit a depressed mood.    Review of Systems  Constitutional: Negative.   HENT: Negative.   Eyes: Negative.   Respiratory: Negative.   Cardiovascular: Negative.   Gastrointestinal: Negative.   Genitourinary: Negative.   Musculoskeletal: Negative.   Skin: Negative.   Neurological: Negative.   Endo/Heme/Allergies: Negative.   Psychiatric/Behavioral: Positive for substance abuse (Alcoholism). Negative for depression, suicidal ideas, hallucinations and memory loss. The patient is nervous/anxious and has insomnia.     Blood pressure 117/77, pulse 86, temperature 97.3 F (36.3 C), temperature source Oral, resp. rate 18, height _0  (1.727 m), weight 93.441 kg (206 lb).Body mass index is 31.33 kg/(m^2).  General Appearance: Disheveled  Eye Sport and exercise psychologist::  Fair  Speech:  Clear and Coherent  Volume:  Normal  Mood:  Anxious and denies feeling or being depressed  Affect:  Flat  Thought  Process:  Coherent and Intact  Orientation:  Full (Time, Place, and Person)  Thought Content:  Rumination  Suicidal Thoughts:  No  Homicidal Thoughts:  No  Memory:  Immediate;   Good Recent;   Good Remote;   Good  Judgement:  Fair  Insight:   Fair  Psychomotor Activity:  Normal  Concentration:  Good  Recall:  Good  Akathisia:  No  Handed:  Right  AIMS (if indicated):     Assets:  Communication Skills Desire for Improvement  Sleep:       Past Psychiatric History: Diagnosis: Alcohol related disorder, Bipolar affective disorder  Hospitalizations: [facility] x 6  Outpatient Care: With Met[facility] Clines  Substance Abuse Care: [facility], 2011  Self-Mutilation: Denies  Suicidal Attempts: Denies attempts, admits thoughts  Violent Behaviors: None reported   Past Medical History:   Past Medical History  Diagnosis Date  . Hepatitis C   . Chronic back pain   . A-fib   . Depression   . Anxiety   . Hepatitis C     history of  . Chronic back pain   . Acid reflux   . History of urinary frequency   . Peripheral neuropathy     hands and feet  . Hypertension   . Polysubstance abuse 01/25/2011  . Alcohol abuse   . Seizures    Cardiac History:  HTN, Peripheral neuropathy, A-fib  Allergies:  No Known Allergies  PTA Medications: Prescriptions prior to admission  Medication Sig Dispense Refill  . acamprosate (CAMPRAL) 333 MG tablet Take 666 mg by mouth 3 (three) times daily with meals.      . diclofenac (VOLTAREN) 50 MG EC tablet Take 50 mg by mouth 3 (three) times daily as needed. For joint pain and inflammation.      . gabapentin (NEURONTIN) 300 MG capsule Take 300 mg by mouth 2 (two) times daily.      [name] oxyCODONE-acetaminophen (PERCOCET/ROXICET) 5-325 MG per tablet Take 0.5 tablets by mouth once.      . risperiDONE (RISPERDAL) 2 MG tablet Take 2 mg by mouth at bedtime.      . [DISCONTINUED] diclofenac (VOLTAREN) 50 MG EC tablet Take 1 tablet (50 mg total) by mouth 3 (three) times daily as needed. For joint pain and inflammation.  90 tablet  0    Previous Psychotropic Medications:  Medication/Dose  See medication lists               Substance Abuse History in the last 12 months:  yes  Consequences of Substance  Abuse: Medical Consequences:  Liver damage, Possible death by overdose Legal Consequences:  Arrests, jail time, Loss of driving privilege. Family Consequences:  Family discord, divorce and or separation.  Social History:  reports that he has been smoking Cigarettes.  He has a 25 pack-year smoking history. He quit smokeless tobacco use about 3 months ago. He reports that he drinks alcohol. He reports that he uses illicit drugs ("Crack" cocaine and Cocaine). Additional Social History: Pain Medications: neurotin and unsure of other pain med Prescriptions: campral   risperdal   neurotin Over the Counter: none History of alcohol / drug use?: Yes Longest period of sobriety (when/how long): one year no alcohol or drugs Negative Consequences of Use: Financial;Work / School Withdrawal Symptoms: Agitation;Sweats Onset of Seizures: last seizure 2 years ago when he tried to stop drinking on his own Date of most recent seizure: 2 yrs ago Name of Substance 1: alcohol 1 - Age of First  Use: 43 yrs old 1 - Amount (size/oz): pint vodka every other day and one 40 oz beer 1 - Frequency: every other day 1 - Duration: drank since age of 43 years old, sober one year 1 - Last Use / Amount: yesterday drank one pt vodka and one 40 oz beer  Additional Social history Current Place of Residence: [place], [place] of Birth: [place]  Family Members: "My parents"  Marital Status:  Single  Children: 0  Sons:  Daughters:  Relationships: Single  Education:  No high school diploma  Educational Problems/Performance: Did not complete high school  Religious Beliefs/Practices: NA  History of Abuse (Emotional/Phsycial/Sexual): None reported  Occupational Experiences: Medical laboratory scientific officer History:  None.  Legal History: 2012, imprisoned for armed robbery/larceny   Hobbies/Interests: None reported  Family History:  History reviewed. No pertinent family history.  Results for orders placed during the  hospital encounter of 02/17/13 (from the past 72 hour(s))  CBC     Status: None   Collection Time    02/17/13  8:20 PM      Result Value Range   WBC 6.5  4.0 - 10.5 K/uL   RBC 4.55  4.22 - 5.81 MIL/uL   Hemoglobin 15.4  13.0 - 17.0 g/dL   HCT 43.9  39.0 - 52.0 %   MCV 96.5  78.0 - 100.0 fL   MCH 33.8  26.0 - 34.0 pg   MCHC 35.1  30.0 - 36.0 g/dL   RDW 12.9  11.5 - 15.5 %   Platelets 180  150 - 400 K/uL  COMPREHENSIVE METABOLIC PANEL     Status: Abnormal   Collection Time    02/17/13  8:20 PM      Result Value Range   Sodium 141  137 - 147 mEq/L   Potassium 4.3  3.7 - 5.3 mEq/L   Chloride 101  96 - 112 mEq/L   CO2 25  19 - 32 mEq/L   Glucose, Bld 103 (*) 70 - 99 mg/dL   BUN 12  6 - 23 mg/dL   Creatinine, Ser 0.96  0.50 - 1.35 mg/dL   Calcium 8.4  8.4 - 10.5 mg/dL   Total Protein 7.5  6.0 - 8.3 g/dL   Albumin 3.5  3.5 - 5.2 g/dL   AST 30  0 - 37 U/L   ALT 59 (*) 0 - 53 U/L   Alkaline Phosphatase 69  39 - 117 U/L   Total Bilirubin 0.3  0.3 - 1.2 mg/dL   GFR calc non Af Amer >90  >90 mL/min   GFR calc Af Amer >90  >90 mL/min   Comment: (NOTE)     The eGFR has been calculated using the CKD EPI equation.     This calculation has not been validated in all clinical situations.     eGFR's persistently <90 mL/min signify possible Chronic Kidney     Disease.  ETHANOL     Status: Abnormal   Collection Time    02/17/13  8:20 PM      Result Value Range   Alcohol, Ethyl (B) 114 (*) 0 - 11 mg/dL   Comment:            LOWEST DETECTABLE LIMIT FOR     SERUM ALCOHOL IS 11 mg/dL     FOR MEDICAL PURPOSES ONLY  ACETAMINOPHEN LEVEL     Status: None   Collection Time    02/17/13  8:20 PM      Result  Value Range   Acetaminophen (Tylenol), Serum <15.0  10 - 30 ug/mL   Comment:            THERAPEUTIC CONCENTRATIONS VARY     SIGNIFICANTLY. A RANGE OF 10-30     ug/mL MAY BE AN EFFECTIVE     CONCENTRATION FOR MANY PATIENTS.     HOWEVER, SOME ARE BEST TREATED     AT CONCENTRATIONS OUTSIDE  THIS     RANGE.     ACETAMINOPHEN CONCENTRATIONS     >150 ug/mL AT 4 HOURS AFTER     INGESTION AND >50 ug/mL AT 12     HOURS AFTER INGESTION ARE     OFTEN ASSOCIATED WITH TOXIC     REACTIONS.  SALICYLATE LEVEL     Status: Abnormal   Collection Time    02/17/13  8:20 PM      Result Value Range   Salicylate Lvl <6.3 (*) 2.8 - 20.0 mg/dL  URINE RAPID DRUG SCREEN (HOSP PERFORMED)     Status: None   Collection Time    02/17/13  8:41 PM      Result Value Range   Opiates NONE DETECTED  NONE DETECTED   Cocaine NONE DETECTED  NONE DETECTED   Benzodiazepines NONE DETECTED  NONE DETECTED   Amphetamines NONE DETECTED  NONE DETECTED   Tetrahydrocannabinol NONE DETECTED  NONE DETECTED   Barbiturates NONE DETECTED  NONE DETECTED   Comment:            DRUG SCREEN FOR MEDICAL PURPOSES     ONLY.  IF CONFIRMATION IS NEEDED     FOR ANY PURPOSE, NOTIFY LAB     WITHIN 5 DAYS.                LOWEST DETECTABLE LIMITS     FOR URINE DRUG SCREEN     Drug Class       Cutoff (ng/mL)     Amphetamine      1000     Barbiturate      200     Benzodiazepine   016     Tricyclics       010     Opiates          300     Cocaine          300     THC              50  POCT I-STAT TROPONIN I     Status: None   Collection Time    02/17/13  8:49 PM      Result Value Range   Troponin i, poc 0.01  0.00 - 0.08 ng/mL   Comment 3            Comment: Due to the release kinetics of cTnI,     a negative result within the first hours     of the onset of symptoms does not rule out     myocardial infarction with certainty.     If myocardial infarction is still suspected,     repeat the test at appropriate intervals.  POCT I-STAT TROPONIN I     Status: None   Collection Time    02/18/13 12:16 AM      Result Value Range   Troponin i, poc 0.00  0.00 - 0.08 ng/mL   Comment 3            Comment: Due to the release kinetics of cTnI,  a negative result within the first hours     of the onset of symptoms does not rule  out     myocardial infarction with certainty.     If myocardial infarction is still suspected,     repeat the test at appropriate intervals.  TROPONIN I     Status: None   Collection Time    02/18/13  4:08 AM      Result Value Range   Troponin I <0.30  <0.30 ng/mL   Comment:            Due to the release kinetics of cTnI,     a negative result within the first hours     of the onset of symptoms does not rule out     myocardial infarction with certainty.     If myocardial infarction is still suspected,     repeat the test at appropriate intervals.  POCT I-STAT TROPONIN I     Status: None   Collection Time    02/18/13  6:22 AM      Result Value Range   Troponin i, poc 0.01  0.00 - 0.08 ng/mL   Comment 3            Comment: Due to the release kinetics of cTnI,     a negative result within the first hours     of the onset of symptoms does not rule out     myocardial infarction with certainty.     If myocardial infarction is still suspected,     repeat the test at appropriate intervals.   Psychological Evaluations:  Assessment:   DSM5: Schizophrenia Disorders:  NA Obsessive-Compulsive Disorders:  NA Trauma-Stressor Disorders:  NA Substance/Addictive Disorders:  Alcohol Related Disorder - Severe (303.90) Depressive Disorders:  Bipolar affective disorder  AXIS I:  Alcohol Related Disorder - Severe (303.90), Bipolar affective disorder AXIS II:  Deferred AXIS III:   Past Medical History  Diagnosis Date  . Hepatitis C   . Chronic back pain   . A-fib   . Depression   . Anxiety   . Hepatitis C     history of  . Chronic back pain   . Acid reflux   . History of urinary frequency   . Peripheral neuropathy     hands and feet  . Hypertension   . Polysubstance abuse 01/25/2011  . Alcohol abuse   . Seizures    AXIS IV:  economic problems, educational problems, housing problems, occupational problems and Alcoholism, chronic AXIS V:  11-20 some danger of hurting self or  others possible OR occasionally fails to maintain minimal personal hygiene OR gross impairment in communication  Treatment Plan/Recommendations: 1. Admit for crisis management and stabilization, estimated length of stay 3-5 days.  2. Medication management to reduce current symptoms to base line and improve the patient's overall level of functioning  3. Treat health problems as indicated.  4. Develop treatment plan to decrease risk of relapse upon discharge and the need for readmission.  5. Psycho-social education regarding relapse prevention and self care.  6. Health care follow up as needed for medical problems.  7. Review, reconcile, and reinstate any pertinent home medications for other health issues where appropriate. 8. Call for consults with hospitalist for any additional specialty patient care services as needed.  Treatment Plan Summary: Daily contact with patient to assess and evaluate symptoms and progress in treatment Medication management Supportive approach/coping skills/relapse prevention Detox as needed Reassess and address the  co morbidities  Current Medications:  No current facility-administered medications for this encounter.   Observation Level/Precautions:  15 minute checks  Laboratory:  Reviewed ED lab findings on file  Psychotherapy:  Group sessions, AA/NA meetings  Medications:  See medication lists  Consultations: As needed    Discharge Concerns:  Maintaining sobriety, Safety  Estimated LOS: 2-4 days  Other:     I certify that inpatient services furnished can reasonably be expected to improve the patient's condition.   Encarnacion Slates, FNP, PMHNP 1/8/201510:07 AM  Personally evaluated the patient, reviewed the physical exam and agree with assessment and plan Geralyn Flash A. Sabra Heck, M.D.

## 2013-02-18 NOTE — ED Notes (Signed)
Marcus at [facility] says to hold the patient until after shift change.

## 2013-02-18 NOTE — Progress Notes (Signed)
Patient ID: Blake Arnold, male   DOB: [birth year], 43 y.o.   MRN: [id number] Pt attended AA Group.

## 2013-02-18 NOTE — Progress Notes (Signed)
Patient has multiple admissions to [facility], voluntary.  Patient last drank yesterday one pint vodka and one 40 oz beer. Denied any legal problems.  Denied abuse.  Has tattoos over upper back, right and left upper arms, and left and right side of neck.   History of A-fib, stated he was "shocked in 2008".  Denied surgeries.  Reading glasses are at home.  Patient stated he is single, no children.  Last worked in May 2011 for [employer].  Has filed for disability for lower back pain, has MD appointment today but patient called MD office to cancel appt.  Patient stated he lives with his mother who supports him, and also his alcoholic brother lives in [place].  His brother's drinking  causes him to drink.  Patient plans to return to his mother's house after discharge from [facility].  Patient's mother or bus takes him to his medical appointments.  Patient sated he was diagnosed with Hepatitis C in 2001, would like to seek treatment as he has never received treatment for Hepatitis.  Stated he started drinking alcohol when he was 43 yrs old, now drinks every other day pint vodka and beer.   Quit using THC at age 43 yrs.  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  "If it is given, I'll use it but I won't buy it."  Had suicidal thoughts before he came to hospital, no plan, "Just want to die."  Denied SI and HI this morning during admission, contracts for safety.  Denied A/V hallucinations.  Denied pain.  Patient stated he told his brother of his suicidal thoughts, got on city bus and went to hospital.  Has "passed out many times".  Last seizure 2 years ago when he was trying to stop drinking alcohol.   PMH HTN, A-fib, anxiety, depression, GERD, seizure, Hepatitis C.  Patient has been going to [facility].  Patient oriented to unit.  Food/drink offered to patient. Fall risk assessment discussed and given to patient. Locker 43 has  pocket knife, wallet, lighter, brown belt, 2 coats, hat, gloves, voltaren, campral, neurotin, risperdal.\

## 2013-02-18 NOTE — ED Notes (Signed)
Pt has signed consent for release and release of information forms, and they have been faxed to [facility].

## 2013-02-18 NOTE — [facility] Counselor (Signed)
Adult Psychosocial Assessment Update Interdisciplinary Team  Previous [facility] admissions/discharges:  Admissions Discharges  Date: 02/18/13 Date: unknown   Date: 11/27/12 Date: 11/30/12  Date: 10/24/12 Date: 10/28/12  Date: 06/08/12 Date: 06/11/12  Date: 04/02/12 Date: 04/07/12   Changes since the last Psychosocial Assessment (including adherence to outpatient mental health and/or substance abuse treatment, situational issues contributing to decompensation and/or relapse). Blake Arnold is an 43 y.o. male.  Pt presents with depression and thoughts of killing himself. No plan. Also drinks heavily. Patient said that he has a lot of conflict and turmoil with his siblings. He does not agree with their lifestyle and this causes him a lot of stress. Patient lives with his parents and drinks steadily. Patient admits to thoughts of killing himself for the last two weeks. He has no plan at this time but is upset about his depression getting to the point of having SI. No HI or A/V hallucinations. Patient has been drinking 1 pint and a 40 oz beer whenever he drinks. He used to drink daily but said that over the the last month he has been drinking every other day. His last drink was 01/07 and was a pint and a 40.  Patient has had muliple admissions to [facility] and has been going to outpatient services at [place]. Dr. Magda Paganini O'Niell is his psychiatrist.  -              Discharge Plan 1. Will you be returning to the same living situation after discharge?   Yes: UNKNOWN No:      If no, what is your plan?    Pt currently unsure of what he would like to do at d/c. Pt considering inpatient treatment for SA and MI.        2. Would you like a referral for services when you are discharged? Yes:     If yes, for what services?  No:       CSW assessing for appropriate referrals. Pt unsure of what he wants at this time. PT may qualify for ACT services due to high amount of  recent hospitalizations. Pt also good candidate for inpatient treatment. Currently, he sees Dr. Granville Lewis for med management at [place].        Summary and Recommendations (to be completed by the evaluator) Pt is 43 year old male living in [place], [place] ([place] He presents to [facility] for depression, SI, and ETOH detox. This is one of many admissions for pt over the past year. He follows up at [facility] for med management. Recommendations for pt include: crisis stabilization, therapeutic milieu, encourage group attendance and participation, librium taper for withdrawals, medication management for mood stabilization, and development of comprehensive mental wellness/sobriety plan. CSW assessing for appropriate referrals.                        Signature:  Higinio Roger  02/18/2013 8:55 AM

## 2013-02-19 DIAGNOSIS — F319 Bipolar disorder, unspecified: Secondary | ICD-10-CM

## 2013-02-19 DIAGNOSIS — F17200 Nicotine dependence, unspecified, uncomplicated: Secondary | ICD-10-CM

## 2013-02-19 DIAGNOSIS — F10239 Alcohol dependence with withdrawal, unspecified: Secondary | ICD-10-CM

## 2013-02-19 DIAGNOSIS — F1910 Other psychoactive substance abuse, uncomplicated: Secondary | ICD-10-CM

## 2013-02-19 DIAGNOSIS — F10939 Alcohol use, unspecified with withdrawal, unspecified: Secondary | ICD-10-CM

## 2013-02-19 DIAGNOSIS — F1010 Alcohol abuse, uncomplicated: Secondary | ICD-10-CM

## 2013-02-19 NOTE — [facility] Suicide Risk Assessment (Signed)
[facility] INPATIENT:  Family/Significant Other Suicide Prevention Education  Suicide Prevention Education:  Education Completed; Kallie Edward (pt's stepfather) [phone] has been identified by the patient as the family member/significant other with whom the patient will be residing, and identified as the person(s) who will aid the patient in the event of a mental health crisis (suicidal ideations/suicide attempt).  With written consent from the patient, the family member/significant other has been provided the following suicide prevention education, prior to the and/or following the discharge of the patient.  The suicide prevention education provided includes the following:  Suicide risk factors  Suicide prevention and interventions  National Suicide Hotline telephone number  [facility] assessment telephone number  [facility] Emergency Assistance [place] and/or Residential Mobile Crisis Unit telephone number  Request made of family/significant other to:  Remove weapons (e.g., guns, rifles, knives), all items previously/currently identified as safety concern.    Remove drugs/medications (over-the-counter, prescriptions, illicit drugs), all items previously/currently identified as a safety concern.  The family member/significant other verbalizes understanding of the suicide prevention education information provided.  The family member/significant other agrees to remove the items of safety concern listed above.  Pt's stepfather supportive of pt staying through the weekend. He does not feel the pt is at risk for Suicide but is worried that pt will relapse if d/ced too soon. Pt not scheduled for weekend d/c.   Smart, Yennifer Segovia Blair  02/19/2013, 3:25 PM

## 2013-02-19 NOTE — Tx Team (Signed)
Interdisciplinary Treatment Plan Update (Adult)  Date: 02/19/2013  Time Reviewed: 11:25 AM  Progress in Treatment:  Attending groups:Yes/intermittently Participating in groups:  Yes. When he attends  Taking medication as prescribed: Yes  Tolerating medication: Yes  Family/Significant othe contact made: Not yet. SPE required for pt.   Patient understands diagnosis: Yes, AEB seeking treatment for ETOH detox, passive SI, and mood stabilization/depression.  Discussing patient identified problems/goals with staff: Yes  Medical problems stabilized or resolved: Yes  Denies suicidal/homicidal ideation: Yes during group/self report.  Patient has not harmed self or Others: Yes  New problem(s) identified:  Discharge Plan or Barriers: Pt would like to return home and continue with [facility] for med management and therapy. He also attends Tues/Thurs substance abuse groups. Pt states that he needs to get on medication to better help him with depression. Pt focuses on MH issues as primary problem. Additional comments: This is yet another admission assessment for this 43 year old Caucasian male. Admitted to [facility] from the [facility] ED with complaints of suicidal ideations and increased alcohol consumption. Patient reports, "I took the bus to get to the [facility] ED yesterday. I was having suicidal thoughts, a lot of anxiety build-up. I have no idea why this started in the first place for me. The only reason that I can give is that I'm living with my mother and step-father. The problem is not with these 2, rather their place is full of other relatives who are getting on my nerves. I was in this hospital last October, 2014 because of suicidal ideation and increased alcohol consumption. I needed detoxification treatment that time too. I have been drinking quite a bit, but no drug use. I was drinking a pint of Vodka in 2 days. I drink out of habit. I have been doing this for 2-3 weeks. My suicidal ideations  started around thanksgiving period. I did not attempt suicide, and I have no history of suicide attempts. I have had one year sobriety from alcoholism in 2012-2013. That time, I was in prison for arm robbery and larceny".   Reason for Continuation of Hospitalization: Mood stabilization Medication managment Estimated length of stay: 3-4 days  For review of initial/current patient goals, please see plan of care.  Attendees:  Patient:    Family:    Physician: Carlton Adam MD 02/19/2013 11:25 AM   Nursing:   Clinical Social Worker [place], [place]  02/19/2013 11:25 AM   Other:    Other: Aggie N PA 02/19/2013 11:25 AM   Other: Gerline Legacy Nurse CM  02/19/2013 11:25 AM   Other:    Scribe for Treatment Team:  [place] 02/19/2013 11:25 AM

## 2013-02-19 NOTE — BHH Group Notes (Signed)
Adult Psychoeducational Group Note  Date:  02/19/2013 Time:  10:31 PM  Group Topic/Focus:  AA Meeting  Participation Level:  Active  Participation Quality:  Appropriate  Affect:  Appropriate  Cognitive:  Appropriate  Insight: Appropriate  Engagement in Group:  Engaged  Modes of Intervention:  Discussion and Education  Additional Comments:  Daquane attended [employer].  Victorino Sparrow A 02/19/2013, 10:31 PM

## 2013-02-19 NOTE — Progress Notes (Signed)
Patient ID: Blake Arnold, male   DOB: 1970/04/01, 43 y.o.   MRN: [id number] 02-19-2013 nursing shift note: D: pt has been visible in the milieu today and has not voiced any complaints. He is a fall risk due to a hx of detox seizures.he denied any si/hi/av.  A: seizure precautions taken. RN made himself available to this patient. R: on his inventory sheet he wrote: slept well, appetite good, energy normal, attention good with his depression and hopelessness both at 1. No pain or withdrawal symptoms. After discharge he plans to " keep going to my appointment and to stay away from etoh.  RN will monitor and Q 15 min ck's continue.

## 2013-02-19 NOTE — Progress Notes (Signed)
[facility] MD Progress Note  02/19/2013 2:09 PM Blake Arnold  MRN:  [id number]  Subjective: Blake Arnold reports feeling some anxiety and mild depression. However, states he feels better than when he came into the hospital. He adds that after discharge, he will not surround himself with a lot of negativity any more. Rather, he will spend more time with his father, do a lot of walking just to add some routine into his daily life and take his medicines as ordered.  Diagnosis:   DSM5: Schizophrenia Disorders:  NA Obsessive-Compulsive Disorders:  NA Trauma-Stressor Disorders:  NA Substance/Addictive Disorders:  Alcohol Related Disorder - Severe (303.90) Depressive Disorders:  Bipolar affective disorder  Axis I: Bipolar affective disorder Axis II: Deferred Axis III:  Past Medical History  Diagnosis Date  . Hepatitis C   . Chronic back pain   . A-fib   . Depression   . Anxiety   . Hepatitis C     history of  . Chronic back pain   . Acid reflux   . History of urinary frequency   . Peripheral neuropathy     hands and feet  . Hypertension   . Polysubstance abuse 01/25/2011  . Alcohol abuse   . Seizures    Axis IV: other psychosocial or environmental problems and Alcoholism Axis V: 41-50 serious symptoms  ADL's:  Fairly intact  Sleep: Fair  Appetite:  Fair  Suicidal Ideation:  Plan:  Denies Intent:  Denies Means:  Denies Homicidal Ideation:  Plan:  Denies Intent:  Denies Means:  Denies AEB (as evidenced by):  Psychiatric Specialty Exam: Review of Systems  Constitutional: Positive for malaise/fatigue.  HENT: Negative.   Eyes: Negative.   Respiratory: Negative.   Cardiovascular: Negative.   Gastrointestinal: Negative.   Genitourinary: Negative.   Musculoskeletal: Negative.   Skin: Negative.   Neurological: Positive for tremors and weakness.  Endo/Heme/Allergies: Negative.   Psychiatric/Behavioral: Positive for substance abuse (Alcoholism). Negative for depression,  suicidal ideas, hallucinations (       ) and memory loss. The patient is nervous/anxious ( Currently on medication for stabilization ) and has insomnia ( Currently on medication for stabilization  ).     Blood pressure 134/89, pulse 95, temperature 98.4 F (36.9 C), temperature source Oral, resp. rate 20, height _0  (1.727 m), weight 93.441 kg (206 lb).Body mass index is 31.33 kg/(m^2).  General Appearance: Fairly Groomed  Engineer, water::  Good  Speech:  Clear and Coherent  Volume:  Normal  Mood:  Anxious and Depressed  Affect:  Flat  Thought Process:  Coherent and Goal Directed  Orientation:  Full (Time, Place, and Person)  Thought Content:  Rumination  Suicidal Thoughts:  No  Homicidal Thoughts:  No  Memory:  Immediate;   Good Recent;   Good Remote;   Good  Judgement:  Fair  Insight:  Fair  Psychomotor Activity:  Tremor  Concentration:  Fair  Recall:  Fair  Akathisia:  No  Handed:  Right  AIMS (if indicated):     Assets:  Communication Skills Desire for Improvement  Sleep:  Number of Hours: 6.5   Current Medications: Current Facility-Administered Medications  Medication Dose Route Frequency Provider Last Rate Last Dose  . acamprosate (CAMPRAL) tablet 666 mg  666 mg Oral TID WC Encarnacion Slates, NP   666 mg at 02/19/13 1123  . acetaminophen (TYLENOL) tablet 650 mg  650 mg Oral Q6H PRN Encarnacion Slates, NP      . alum &  mag hydroxide-simeth (MAALOX/MYLANTA) 200-200-20 MG/5ML suspension 30 mL  30 mL Oral Q4H PRN Encarnacion Slates, NP      . chlordiazePOXIDE (LIBRIUM) capsule 25 mg  25 mg Oral TID PRN Encarnacion Slates, NP      . diclofenac (VOLTAREN) EC tablet 50 mg  50 mg Oral TID PRN Encarnacion Slates, NP      . gabapentin (NEURONTIN) capsule 300 mg  300 mg Oral BID Encarnacion Slates, NP   300 mg at 02/19/13 0845  . hydrOXYzine (ATARAX/VISTARIL) tablet 25 mg  25 mg Oral Q6H PRN Encarnacion Slates, NP      . magnesium hydroxide (MILK OF MAGNESIA) suspension 30 mL  30 mL Oral Daily PRN Encarnacion Slates, NP       . risperiDONE (RISPERDAL) tablet 2 mg  2 mg Oral QHS Encarnacion Slates, NP   2 mg at 02/18/13 2112  . traZODone (DESYREL) tablet 100 mg  100 mg Oral QHS Encarnacion Slates, NP   100 mg at 02/18/13 2112    Lab Results:  Results for orders placed during the hospital encounter of 02/17/13 (from the past 48 hour(s))  CBC     Status: None   Collection Time    02/17/13  8:20 PM      Result Value Range   WBC 6.5  4.0 - 10.5 K/uL   RBC 4.55  4.22 - 5.81 MIL/uL   Hemoglobin 15.4  13.0 - 17.0 g/dL   HCT 43.9  39.0 - 52.0 %   MCV 96.5  78.0 - 100.0 fL   MCH 33.8  26.0 - 34.0 pg   MCHC 35.1  30.0 - 36.0 g/dL   RDW 12.9  11.5 - 15.5 %   Platelets 180  150 - 400 K/uL  COMPREHENSIVE METABOLIC PANEL     Status: Abnormal   Collection Time    02/17/13  8:20 PM      Result Value Range   Sodium 141  137 - 147 mEq/L   Potassium 4.3  3.7 - 5.3 mEq/L   Chloride 101  96 - 112 mEq/L   CO2 25  19 - 32 mEq/L   Glucose, Bld 103 (*) 70 - 99 mg/dL   BUN 12  6 - 23 mg/dL   Creatinine, Ser 0.96  0.50 - 1.35 mg/dL   Calcium 8.4  8.4 - 10.5 mg/dL   Total Protein 7.5  6.0 - 8.3 g/dL   Albumin 3.5  3.5 - 5.2 g/dL   AST 30  0 - 37 U/L   ALT 59 (*) 0 - 53 U/L   Alkaline Phosphatase 69  39 - 117 U/L   Total Bilirubin 0.3  0.3 - 1.2 mg/dL   GFR calc non Af Amer >90  >90 mL/min   GFR calc Af Amer >90  >90 mL/min   Comment: (NOTE)     The eGFR has been calculated using the CKD EPI equation.     This calculation has not been validated in all clinical situations.     eGFR's persistently <90 mL/min signify possible Chronic Kidney     Disease.  ETHANOL     Status: Abnormal   Collection Time    02/17/13  8:20 PM      Result Value Range   Alcohol, Ethyl (B) 114 (*) 0 - 11 mg/dL   Comment:            LOWEST DETECTABLE LIMIT FOR     SERUM  ALCOHOL IS 11 mg/dL     FOR MEDICAL PURPOSES ONLY  ACETAMINOPHEN LEVEL     Status: None   Collection Time    02/17/13  8:20 PM      Result Value Range   Acetaminophen (Tylenol),  Serum <15.0  10 - 30 ug/mL   Comment:            THERAPEUTIC CONCENTRATIONS VARY     SIGNIFICANTLY. A RANGE OF 10-30     ug/mL MAY BE AN EFFECTIVE     CONCENTRATION FOR MANY PATIENTS.     HOWEVER, SOME ARE BEST TREATED     AT CONCENTRATIONS OUTSIDE THIS     RANGE.     ACETAMINOPHEN CONCENTRATIONS     >150 ug/mL AT 4 HOURS AFTER     INGESTION AND >50 ug/mL AT 12     HOURS AFTER INGESTION ARE     OFTEN ASSOCIATED WITH TOXIC     REACTIONS.  SALICYLATE LEVEL     Status: Abnormal   Collection Time    02/17/13  8:20 PM      Result Value Range   Salicylate Lvl <5.3 (*) 2.8 - 20.0 mg/dL  URINE RAPID DRUG SCREEN (HOSP PERFORMED)     Status: None   Collection Time    02/17/13  8:41 PM      Result Value Range   Opiates NONE DETECTED  NONE DETECTED   Cocaine NONE DETECTED  NONE DETECTED   Benzodiazepines NONE DETECTED  NONE DETECTED   Amphetamines NONE DETECTED  NONE DETECTED   Tetrahydrocannabinol NONE DETECTED  NONE DETECTED   Barbiturates NONE DETECTED  NONE DETECTED   Comment:            DRUG SCREEN FOR MEDICAL PURPOSES     ONLY.  IF CONFIRMATION IS NEEDED     FOR ANY PURPOSE, NOTIFY LAB     WITHIN 5 DAYS.                LOWEST DETECTABLE LIMITS     FOR URINE DRUG SCREEN     Drug Class       Cutoff (ng/mL)     Amphetamine      1000     Barbiturate      200     Benzodiazepine   005     Tricyclics       110     Opiates          300     Cocaine          300     THC              50  POCT I-STAT TROPONIN I     Status: None   Collection Time    02/17/13  8:49 PM      Result Value Range   Troponin i, poc 0.01  0.00 - 0.08 ng/mL   Comment 3            Comment: Due to the release kinetics of cTnI,     a negative result within the first hours     of the onset of symptoms does not rule out     myocardial infarction with certainty.     If myocardial infarction is still suspected,     repeat the test at appropriate intervals.  POCT I-STAT TROPONIN I     Status: None   Collection  Time    02/18/13 12:16 AM  Result Value Range   Troponin i, poc 0.00  0.00 - 0.08 ng/mL   Comment 3            Comment: Due to the release kinetics of cTnI,     a negative result within the first hours     of the onset of symptoms does not rule out     myocardial infarction with certainty.     If myocardial infarction is still suspected,     repeat the test at appropriate intervals.  TROPONIN I     Status: None   Collection Time    02/18/13  4:08 AM      Result Value Range   Troponin I <0.30  <0.30 ng/mL   Comment:            Due to the release kinetics of cTnI,     a negative result within the first hours     of the onset of symptoms does not rule out     myocardial infarction with certainty.     If myocardial infarction is still suspected,     repeat the test at appropriate intervals.  POCT I-STAT TROPONIN I     Status: None   Collection Time    02/18/13  6:22 AM      Result Value Range   Troponin i, poc 0.01  0.00 - 0.08 ng/mL   Comment 3            Comment: Due to the release kinetics of cTnI,     a negative result within the first hours     of the onset of symptoms does not rule out     myocardial infarction with certainty.     If myocardial infarction is still suspected,     repeat the test at appropriate intervals.    Physical Findings: AIMS: Facial and Oral Movements Muscles of Facial Expression: None, normal Lips and Perioral Area: None, normal Jaw: None, normal Tongue: None, normal,Extremity Movements Upper (arms, wrists, hands, fingers): None, normal Lower (legs, knees, ankles, toes): None, normal, Trunk Movements Neck, shoulders, hips: None, normal, Overall Severity Severity of abnormal movements (highest score from questions above): None, normal Incapacitation due to abnormal movements: None, normal Patient's awareness of abnormal movements (rate only patient's report): No Awareness, Dental Status Current problems with teeth and/or dentures?: No Does  patient usually wear dentures?: No  CIWA:  CIWA-Ar Total: 2 COWS:  COWS Total Score: 2  Treatment Plan Summary: Daily contact with patient to assess and evaluate symptoms and progress in treatment Medication management  Plan: Supportive approach/coping skills/relapse prevention. Encouraged out of room, participation in group sessions and application of coping skills when distressed. Will continue to monitor response to/adverse effects of medications in use to assure effectiveness. Continue to monitor mood, behavior and interaction with staff and other patients. Continue current plan of care.  Medical Decision Making Problem Points:  Review of last therapy session (1) and Review of psycho-social stressors (1) Data Points:  Review of medication regiment & side effects (2) Review of new medications or change in dosage (2)  I certify that inpatient services furnished can reasonably be expected to improve the patient's condition.   Lindell Spar I, PMHNP-BC 02/19/2013, 2:09 PM

## 2013-02-19 NOTE — BHH Group Notes (Signed)
[place] LCSW Group Therapy  02/19/2013 3:02 PM  Type of Therapy:  Group Therapy  Participation Level:  Active  Participation Quality:  Attentive  Affect:  Appropriate  Cognitive:  Alert  Insight:  Improving  Engagement in Therapy:  Improving  Modes of Intervention:  Confrontation, Discussion, Education, Exploration, Rapport Building, Socialization and Support  Summary of Progress/Problems: Feelings around Relapse. Group members discussed the meaning of relapse and shared personal stories of relapse, how it affected them and others, and how they perceived themselves during this time. Group members were encouraged to identify triggers, warning signs and coping skills used when facing the possibility of relapse. Social supports were discussed and explored in detail. Post Acute Withdrawal Syndrome (handout provided) was introduced and examined. Pt's were encouraged to ask questions, talk about key points associated with PAWS, and process this information in terms of relapse prevention. Gaige was attentive and engaged throughotu today's therapy group. He stated that he typically drinks to get through difficult situations and has a brother that pushes beer on him when they are together. Perfecto stated that this unsupportive brother is often a trigger for him and was able to acknowledge how keeping his distance from unsupportive people can help him avoid relapse, showing improving insight. Thom followed along as CSW talked about PAWS and shared that he has experienced these symptoms in the past when he was attempting to stay sober.    Smart, Haizel Gatchell LCSWA  02/19/2013, 3:02 PM

## 2013-02-19 NOTE — BHH Group Notes (Signed)
[facility] LCSW Aftercare Discharge Planning Group Note   02/19/2013 9:23 AM  Participation Quality:  Appropriate   Mood/Affect:  Flat  Depression Rating:  0  Anxiety Rating:  0  Thoughts of Suicide:  No Will you contract for safety?   NA  Current AVH:  No  Plan for Discharge/Comments:  Pt reports that he is at [facility] "more for my mood than detox." Pt states that currently he feels "normal" and has no thoughts of hurting himself. Pt reports that he goes to [facility] for med management and therapy and would like to continue going to this agency after d/c. Pt also attends Tues/Thurs substance abuse groups at [facility]. Pt will return home at d/c.   Transportation Means: parents   Supports: parents   Smart, Research officer, trade union

## 2013-02-19 NOTE — Progress Notes (Signed)
[facility] Group Notes:  (Nursing/MHT/Case Management/Adjunct)  Date:  02/18/2013  Time:  2100  Type of Therapy:  wrap up group  Participation Level:  Minimal  Participation Quality:  Appropriate, Attentive and Sharing  Affect:  Flat  Cognitive:  Appropriate  Insight:  Appropriate  Engagement in Group:  Supportive  Modes of Intervention:  Clarification, Education and Support  Summary of Progress/Problems: Pt states, "I have been at the bottom of the bottle for 25 years and I want out of it".   Jacques Navy 02/19/2013, 2:55 AM

## 2013-02-20 DIAGNOSIS — F411 Generalized anxiety disorder: Secondary | ICD-10-CM

## 2013-02-20 DIAGNOSIS — F332 Major depressive disorder, recurrent severe without psychotic features: Secondary | ICD-10-CM

## 2013-02-20 NOTE — BHH Group Notes (Signed)
[facility] Group Notes:  (Clinical Social Work)  02/20/2013     10-11AM  Summary of Progress/Problems:   The main focus of today's process group was for the patient to identify ways in which they have in the past sabotaged their own recovery. Motivational Interviewing was utilized to ask the group members what they get out of their substance use, and what reasons they may have for wanting to change.  The Stages of Change were explained using a handout, and patients identified where they currently are with regard to stages of change.  The patient expressed that he self-sabotages by thinking he can drink 1-2 days a week, or just drink less, and he in fact feels he is doing better because he has reduced how often and how much he drinks.  However, he feels that if he continues to drink, he will die because he has both heart and liver disease.  He has also lost important relationship with his family as a result of his drinking or cocaine use.  He stated emphatically that if he is around cocaine, he is going to use it.  He feels he is in Action stage.  Type of Therapy:  Group Therapy - Process   Participation Level:  Active  Participation Quality:  Attentive and Sharing  Affect:  Blunted and Depressed  Cognitive:  Oriented  Insight:  Developing/Improving  Engagement in Therapy:  Developing/Improving  Modes of Intervention:  Education, Support and Processing, Motivational Interviewing  Selmer Dominion, LCSW 02/20/2013, 12:29 PM

## 2013-02-20 NOTE — Progress Notes (Signed)
Seen and agreed. Aliyah Abeyta, MD 

## 2013-02-20 NOTE — Progress Notes (Signed)
[facility] MD Progress Note  02/20/2013 10:57 AM Blake Arnold  MRN:  [id number] Subjective:  Depression is very little, anxiety is low.  He requests to leave and signed a 72 hour form, projected to leave tomorrow if his appointments are in place.  He is on Campral is helping his cravings and he is having positive affects.  Sleep and appetite are "good". Diagnosis:   DSM5:  Substance/Addictive Disorders:  Alcohol Related Disorder - Severe (303.90), Alcohol Intoxication with Use Disorder - Severe (F10.229) and Alcohol Withdrawal (291.81) Depressive Disorders:  Major Depressive Disorder - Severe (296.23)  Axis I: Alcohol Abuse, Anxiety Disorder NOS and Major Depression, Recurrent severe Axis II: Deferred Axis III:  Past Medical History  Diagnosis Date  . Hepatitis C   . Chronic back pain   . A-fib   . Depression   . Anxiety   . Hepatitis C     history of  . Chronic back pain   . Acid reflux   . History of urinary frequency   . Peripheral neuropathy     hands and feet  . Hypertension   . Polysubstance abuse 01/25/2011  . Alcohol abuse   . Seizures    Axis IV: other psychosocial or environmental problems, problems related to social environment and problems with primary support group Axis V: 41-50 serious symptoms  ADL's:  Intact  Sleep: Good  Appetite:  Good  Suicidal Ideation:  Denies  Homicidal Ideation:  Denies   Psychiatric Specialty Exam: Review of Systems  Constitutional: Negative.   HENT: Negative.   Eyes: Negative.   Respiratory: Negative.   Cardiovascular: Negative.   Gastrointestinal: Negative.   Genitourinary: Negative.   Musculoskeletal: Negative.   Skin: Negative.   Neurological: Negative.   Endo/Heme/Allergies: Negative.   Psychiatric/Behavioral: Positive for depression and substance abuse.    Blood pressure 108/75, pulse 118, temperature 97.6 F (36.4 C), temperature source Oral, resp. rate 18, height 5\' 8"  (1.727 m), weight 93.441 kg (206 lb).Body  mass index is 31.33 kg/(m^2).  General Appearance: Casual  Eye Contact::  Fair  Speech:  Normal Rate  Volume:  Normal  Mood:  Anxious and Depressed  Affect:  Congruent  Thought Process:  Coherent  Orientation:  Full (Time, Place, and Person)  Thought Content:  WDL  Suicidal Thoughts:  No  Homicidal Thoughts:  No  Memory:  Immediate;   Fair Recent;   Fair Remote;   Fair  Judgement:  Fair  Insight:  Fair  Psychomotor Activity:  Normal  Concentration:  Fair  Recall:  Fair  Akathisia:  No  Handed:  Right  AIMS (if indicated):     Assets:  Resilience  Sleep:  Number of Hours: 6.75   Current Medications: Current Facility-Administered Medications  Medication Dose Route Frequency Provider Last Rate Last Dose  . acamprosate (CAMPRAL) tablet 666 mg  666 mg Oral TID WC Encarnacion Slates, NP   666 mg at 02/20/13 6295  . acetaminophen (TYLENOL) tablet 650 mg  650 mg Oral Q6H PRN Encarnacion Slates, NP      . alum & mag hydroxide-simeth (MAALOX/MYLANTA) 200-200-20 MG/5ML suspension 30 mL  30 mL Oral Q4H PRN Encarnacion Slates, NP      . chlordiazePOXIDE (LIBRIUM) capsule 25 mg  25 mg Oral TID PRN Encarnacion Slates, NP      . diclofenac (VOLTAREN) EC tablet 50 mg  50 mg Oral TID PRN Encarnacion Slates, NP      . gabapentin (NEURONTIN)  capsule 300 mg  300 mg Oral BID Encarnacion Slates, NP   300 mg at 02/20/13 4782  . hydrOXYzine (ATARAX/VISTARIL) tablet 25 mg  25 mg Oral Q6H PRN Encarnacion Slates, NP      . magnesium hydroxide (MILK OF MAGNESIA) suspension 30 mL  30 mL Oral Daily PRN Encarnacion Slates, NP      . risperiDONE (RISPERDAL) tablet 2 mg  2 mg Oral QHS Encarnacion Slates, NP   2 mg at 02/19/13 2123  . traZODone (DESYREL) tablet 100 mg  100 mg Oral QHS Encarnacion Slates, NP   100 mg at 02/19/13 2123    Lab Results: No results found for this or any previous visit (from the past 48 hour(s)).  Physical Findings: AIMS: Facial and Oral Movements Muscles of Facial Expression: None, normal Lips and Perioral Area: None,  normal Jaw: None, normal Tongue: None, normal,Extremity Movements Upper (arms, wrists, hands, fingers): None, normal Lower (legs, knees, ankles, toes): None, normal, Trunk Movements Neck, shoulders, hips: None, normal, Overall Severity Severity of abnormal movements (highest score from questions above): None, normal Incapacitation due to abnormal movements: None, normal Patient's awareness of abnormal movements (rate only patient's report): No Awareness, Dental Status Current problems with teeth and/or dentures?: No Does patient usually wear dentures?: No  CIWA:  CIWA-Ar Total: 2 COWS:  COWS Total Score: 2  Treatment Plan Summary: Daily contact with patient to assess and evaluate symptoms and progress in treatment Medication management  Plan:  Review of chart, vital signs, medications, and notes. 1-Individual and group therapy 2-Medication management for depression and anxiety:  Medications reviewed with the patient and he stated no untoward effects, no changes made 3-Coping skills for depression, anxiety, and alcohol abuse 4-Continue crisis stabilization and management 5-Address health issues--monitoring vital signs, stable 6-Treatment plan in progress to prevent relapse of depression, alcohol abuse, and anxiety  Medical Decision Making Problem Points:  Established problem, stable/improving (1) and Review of psycho-social stressors (1) Data Points:  Review of medication regiment & side effects (2)  I certify that inpatient services furnished can reasonably be expected to improve the patient's condition.   Waylan Boga, [place] 02/20/2013, 10:57 AM

## 2013-02-20 NOTE — Progress Notes (Signed)
D-Patient is out on milieu interacting with peers and attending groups. Rates depression and hopelessness at 0.  Denies pain/SI/HI.  States has d/c plans in place and is requesting inpatient discharge.  No prn medications requested.  A- 12 step concepts reenforced.  Support and encouragement offered.  Continue current POC and evaluation of treatment goals.  Continue 15' checks for safety.  R- Safety maintained.

## 2013-02-20 NOTE — Progress Notes (Signed)
D.  Pt pleasant on approach, denies complaints at this time.  Hopeful for discharge tomorrow, has signed 72 hour request for discharge.  Positive for evening wrap up group,  Interacting appropriately within milieu.  Denies SI/HI/hallucinations at this time.  A.  Support and encouragement offered  R.  Pt remains safe on unit, will continue to discharge.

## 2013-02-20 NOTE — BHH Group Notes (Signed)
[facility] Group Notes:  (Nursing/MHT/Case Management/Adjunct)  Date:  02/20/2013  Time:  10:30 AM  Type of Therapy:  Nurse Education  Participation Level:  Active  Participation Quality:  Appropriate  Affect:  Appropriate  Cognitive:  Alert and Appropriate  Insight:  Appropriate  Engagement in Group:  Improving  Modes of Intervention:  Discussion and Support  Summary of Progress/Problems: Pt is appropriate interacting with staff and peers. Pt was given daily workbook.  Blake Arnold 02/20/2013, 10:30 AM

## 2013-02-21 NOTE — Progress Notes (Signed)
Patient did attend the evening speaker AA meeting.  

## 2013-02-21 NOTE — Progress Notes (Signed)
D: Pt denies SI/HI/AV. Pt is pleasant and cooperative. Pt rates depression at a 1 and Helplessness/hopelessness at a 1.  A: Pt was offered support and encouragement. Pt was given scheduled medications. Pt was encourage to attend groups. Q 15 minute checks were done for safety.  R:Pt attends groups and interacts well with peers and staff. Pt  taking medication. Pt has no complaints.Pt receptive to treatment and safety maintained on unit.

## 2013-02-21 NOTE — BHH Group Notes (Signed)
[place] Group Notes:  (Clinical Social Work)  02/21/2013  10:00-11:00AM  Summary of Progress/Problems:   The main focus of today's process group was to   identify the patient's current support system and decide on other supports that can be put in place.  The picture on workbook was used to discuss why additional supports are needed, and a hand-out was distributed with four definitions/levels of support, then used to talk about how patients have given and received all different kinds of support.  An emphasis was placed on using counselor, doctor, therapy groups, 12-step groups, and problem-specific support groups to expand supports.   There was also an extensive discussion about what constitutes a healthy support versus an unhealthy support.  The patient expressed full comprehension of the concepts presented, and agreed that there is a need to add more supports.  The patient stated the current supports in place are his mother and great-nephew who is 48 months old.  He was very interactive today during group, stated he does not believe in AA/NA, as he has drank in the past with his [place] sponsor.  He has a Social worker but feels that he may have not ended up in the hospital if he had been able to see her as scheduled; she cancelled the appointment.  CSW encouraged him to seek out another counselor if he is unwilling to return to that one, and he agreed that he needs a counselor, will pursue this.  Type of Therapy:  Process Group with Motivational Interviewing  Participation Level:  Active  Participation Quality:  Attentive, Sharing and Supportive  Affect:  Blunted  Cognitive:  Appropriate and Oriented  Insight:  Engaged  Engagement in Therapy:  Engaged  Modes of Intervention:   Education, Support and Processing, Activity  [employer], LCSW 02/21/2013, 12:15pm

## 2013-02-21 NOTE — Progress Notes (Signed)
[place] Group Notes:  (Nursing/MHT/Case Management/Adjunct)  Date:  02/21/2013  Time:  2:45 PM  Type of Therapy:  Psychoeducational Skills  Participation Level:  Did Not Attend  Summary of Progress/Problems: Pt was in bed asleep during group.  Clint Bolder 02/21/2013, 2:45 PM

## 2013-02-21 NOTE — Progress Notes (Signed)
[place] Group Notes:  (Nursing/MHT/Case Management/Adjunct)  Date:  02/20/2013  Time:  2100  Type of Therapy:  wrap up group  Participation Level:  Minimal  Participation Quality:  Appropriate, Attentive and Supportive  Affect:  Appropriate  Cognitive:  Appropriate  Insight:  Lacking  Engagement in Group:  Engaged  Modes of Intervention:  Clarification, Education and Support  Summary of Progress/Problems:  Blake Arnold 02/21/2013, 2:30 AM

## 2013-02-21 NOTE — ED Provider Notes (Signed)
Medical screening examination/treatment/procedure(s) were conducted as a shared visit with non-physician practitioner(s) and myself.  I personally evaluated the patient during the encounter.  EKG Interpretation    Date/Time:  Wednesday February 17 2013 20:25:28 EST Ventricular Rate:  115 PR Interval:  172 QRS Duration: 90 QT Interval:  330 QTC Calculation: 456 R Axis:   59 Text Interpretation:  Sinus tachycardia Septal infarct , age undetermined Abnormal ECG No significant change since last tracing Confirmed by Claira Jeter  MD, [place] ([id number]) on 02/18/2013 1:27:58 AM             Mervin Kung, MD 02/21/13 1955

## 2013-02-21 NOTE — Progress Notes (Signed)
Patient ID: Blake Arnold, male   DOB: 1971/01/14, 43 y.o.   MRN: [id number] [facility] MD Progress Note  02/21/2013 6:34 PM Blake Arnold  MRN:  [id number]  Subjective: Blake Arnold is denying to be discharged today. He says he is not ready to go home yet as his anxiety is high. However, later in the day demanded to go home because he feels he is ready to go now. Already, the md covering for the day had already gone him for the day He is instructed to have to wait till the am for discharge.  Diagnosis:   DSM5: Substance/Addictive Disorders:  Alcohol Related Disorder - Severe (303.90), Alcohol Intoxication with Use Disorder - Severe (F10.229) and Alcohol Withdrawal (291.81) Depressive Disorders:  Major Depressive Disorder - Severe (296.23)  Axis I: Alcohol Abuse, Anxiety Disorder NOS and Major Depression, Recurrent severe Axis II: Deferred Axis III:  Past Medical History  Diagnosis Date  . Hepatitis C   . Chronic back pain   . A-fib   . Depression   . Anxiety   . Hepatitis C     history of  . Chronic back pain   . Acid reflux   . History of urinary frequency   . Peripheral neuropathy     hands and feet  . Hypertension   . Polysubstance abuse 01/25/2011  . Alcohol abuse   . Seizures    Axis IV: other psychosocial or environmental problems, problems related to social environment and problems with primary support group Axis V: 41-50 serious symptoms  ADL's:  Intact  Sleep: Good  Appetite:  Good  Suicidal Ideation:  Denies  Homicidal Ideation:  Denies   Psychiatric Specialty Exam: Review of Systems  Constitutional: Negative.   HENT: Negative.   Eyes: Negative.   Respiratory: Negative.   Cardiovascular: Negative.   Gastrointestinal: Negative.   Genitourinary: Negative.   Musculoskeletal: Negative.   Skin: Negative.   Neurological: Negative.   Endo/Heme/Allergies: Negative.   Psychiatric/Behavioral: Positive for depression and substance abuse.    Blood pressure 143/93,  pulse 101, temperature 97.9 F (36.6 C), temperature source Oral, resp. rate 20, height 5\' 8"  (1.727 m), weight 93.441 kg (206 lb).Body mass index is 31.33 kg/(m^2).  General Appearance: Casual  Eye Contact::  Fair  Speech:  Normal Rate  Volume:  Normal  Mood:  Anxious and Depressed  Affect:  Congruent  Thought Process:  Coherent  Orientation:  Full (Time, Place, and Person)  Thought Content:  WDL  Suicidal Thoughts:  No  Homicidal Thoughts:  No  Memory:  Immediate;   Fair Recent;   Fair Remote;   Fair  Judgement:  Fair  Insight:  Fair  Psychomotor Activity:  Normal  Concentration:  Fair  Recall:  Fair  Akathisia:  No  Handed:  Right  AIMS (if indicated):     Assets:  Resilience  Sleep:  Number of Hours: 5.75   Current Medications: Current Facility-Administered Medications  Medication Dose Route Frequency Provider Last Rate Last Dose  . acamprosate (CAMPRAL) tablet 666 mg  666 mg Oral TID WC Encarnacion Slates, NP   666 mg at 02/21/13 1702  . acetaminophen (TYLENOL) tablet 650 mg  650 mg Oral Q6H PRN Encarnacion Slates, NP      . alum & mag hydroxide-simeth (MAALOX/MYLANTA) 200-200-20 MG/5ML suspension 30 mL  30 mL Oral Q4H PRN Encarnacion Slates, NP      . chlordiazePOXIDE (LIBRIUM) capsule 25 mg  25 mg Oral TID PRN Herbert Pun  [place], NP      . diclofenac (VOLTAREN) EC tablet 50 mg  50 mg Oral TID PRN Encarnacion Slates, NP      . gabapentin (NEURONTIN) capsule 300 mg  300 mg Oral BID Encarnacion Slates, NP   300 mg at 02/21/13 1703  . hydrOXYzine (ATARAX/VISTARIL) tablet 25 mg  25 mg Oral Q6H PRN Encarnacion Slates, NP      . magnesium hydroxide (MILK OF MAGNESIA) suspension 30 mL  30 mL Oral Daily PRN Encarnacion Slates, NP      . risperiDONE (RISPERDAL) tablet 2 mg  2 mg Oral QHS Encarnacion Slates, NP   2 mg at 02/20/13 2126  . traZODone (DESYREL) tablet 100 mg  100 mg Oral QHS Encarnacion Slates, NP   100 mg at 02/20/13 2126    Lab Results: No results found for this or any previous visit (from the past 48  hour(s)).  Physical Findings: AIMS: Facial and Oral Movements Muscles of Facial Expression: None, normal Lips and Perioral Area: None, normal Jaw: None, normal Tongue: None, normal,Extremity Movements Upper (arms, wrists, hands, fingers): None, normal Lower (legs, knees, ankles, toes): None, normal, Trunk Movements Neck, shoulders, hips: None, normal, Overall Severity Severity of abnormal movements (highest score from questions above): None, normal Incapacitation due to abnormal movements: None, normal Patient's awareness of abnormal movements (rate only patient's report): No Awareness, Dental Status Current problems with teeth and/or dentures?: No Does patient usually wear dentures?: No  CIWA:  CIWA-Ar Total: 2 COWS:  COWS Total Score: 2  Treatment Plan Summary: Daily contact with patient to assess and evaluate symptoms and progress in treatment Medication management  Plan:  Supportive approach/coping skills/relapse prevention. Encouraged out of room, participation in group sessions and application of coping skills when distressed. Will continue to monitor response to/adverse effects of medications in use to assure effectiveness. Continue to monitor mood, behavior and interaction with staff and other patients. Continue current plan of care.  Medical Decision Making Problem Points:  Established problem, stable/improving (1) and Review of psycho-social stressors (1) Data Points:  Review of medication regiment & side effects (2)  I certify that inpatient services furnished can reasonably be expected to improve the patient's condition.   Lindell Spar I, [place] 02/21/2013, 6:34 PM

## 2013-02-22 MED ORDER — DICLOFENAC SODIUM 50 MG PO TBEC: 50.0000 mg | DELAYED_RELEASE_TABLET | Freq: Three times a day (TID) | ORAL | Status: DC | PRN

## 2013-02-22 MED ORDER — GABAPENTIN 300 MG PO CAPS: 300.0000 mg | ORAL_CAPSULE | Freq: Two times a day (BID) | ORAL | Status: DC

## 2013-02-22 MED ORDER — TRAZODONE HCL 100 MG PO TABS: 100.0000 mg | ORAL_TABLET | Freq: Every day | ORAL | Status: DC

## 2013-02-22 MED ORDER — RISPERIDONE 2 MG PO TABS: 2.0000 mg | ORAL_TABLET | Freq: Every day | ORAL | Status: DC

## 2013-02-22 MED ORDER — HYDROXYZINE HCL 25 MG PO TABS
25.0000 mg | ORAL_TABLET | Freq: Three times a day (TID) | ORAL | Status: DC
  Filled 2013-02-22: qty 42

## 2013-02-22 MED ORDER — HYDROXYZINE HCL 25 MG PO TABS: ORAL_TABLET | ORAL | Status: DC

## 2013-02-22 MED ORDER — ACAMPROSATE CALCIUM 333 MG PO TBEC: 666.0000 mg | DELAYED_RELEASE_TABLET | Freq: Three times a day (TID) | ORAL | Status: DC

## 2013-02-22 NOTE — BHH Group Notes (Signed)
[facility] LCSW Aftercare Discharge Planning Group Note   02/22/2013 9:30 AM  Participation Quality:  Appropriate   Mood/Affect:  Appropriate  Depression Rating:  0  Anxiety Rating:  0  Thoughts of Suicide:  No Will you contract for safety?   NA  Current AVH:  No  Plan for Discharge/Comments:  Pt reports that he is ready for d/c today. He plans to follow up with Dr. Marisue Ivan at [facility] and with Luellen Pucker for therapy. appts scheduled. Pt also goes to SA groups at [facility] from 5:30-6:30PM nightly at [facility]. PT to return home.   Transportation Means: family member or bus   Supports: some family supports   Proofreader, Research officer, trade union

## 2013-02-22 NOTE — Progress Notes (Signed)
[facility] Adult Case Management Discharge Plan :  Will you be returning to the same living situation after discharge: Yes,  home At discharge, do you have transportation home?:Yes,  family member or bus Do you have the ability to pay for your medications:Yes,  mental health  Release of information consent forms completed and submitted to Medical Records by CSW.  Patient to Follow up at: Follow-up Information   Schedule an appointment as soon as possible for a visit with [facility]. (Walk in between 8am-12PM for hospital followup/medication management/assessment for therapy services. (Patient stated that appt is scheduled-appt card in wallet). )    Contact information:   [address], [place] [address] Phone: [phone] Fax: [phone]      Follow up with [facility]  On 02/26/2013. (Appt with Luellen Pucker for therapy at [facility].)    Contact information:   [address], [place] [address] Phone: [phone] Fax: [phone]      Follow up with [facility]  On 02/23/2013. (Arrive for Substance Abuse Group at 5:30-6:30PM every Tuesday and Thursday. )    Contact information:   [address]  [address] Phone: [phone] Fax: [phone]      Patient denies SI/HI:   Yes,  during group/self report.    Safety Planning and Suicide Prevention discussed:  Yes,  SPE completed with pt's stepfather. SPI pamphlet provided to pt and he was encouraged to share information with support network, ask questions, and talk about concerns.   Smart, Dyamon Sosinski LCSWA  02/22/2013, 10:29 AM

## 2013-02-22 NOTE — Progress Notes (Signed)
D.  Pt pleasant on approach, denies complaints but does state that he is feeling a bit anxious about discharge tomorrow.  He stated he wasn't sure why.  Denies SI/HI/hallucinations of any kind.  Positive for evening AA group, interacting appropriately within milieu.  A.  Support and encouragement offered  R.  Pt remains safe on unit, will continue to monitor.

## 2013-02-22 NOTE — BHH Suicide Risk Assessment (Signed)
Suicide Risk Assessment  Discharge Assessment     Demographic Factors:  Male and Caucasian  Mental Status Per Nursing Assessment::   On Admission:  Suicidal ideation indicated by patient  Current Mental Status by Physician: In full contact with reality. There are no suicidal ideas, plans or intent. His mood is worried, his affect is appropriate. There are no active S/S of withdrawal. He is willing and motivated to do better this time around. States he is going to sit down with his family members and discuss what he thinks needs to change. He claims that family dynamics as well as the relationship with his ex  are usually the trigger for his relapse   Loss Factors: NA  Historical Factors: NA  Risk Reduction Factors:   Living with another person, especially a relative and wants to do better  Continued Clinical Symptoms:  Alcohol/Substance Abuse/Dependencies  Cognitive Features That Contribute To Risk:  Closed-mindedness Polarized thinking Thought constriction (tunnel vision)    Suicide Risk:  Minimal: No identifiable suicidal ideation.  Patients presenting with no risk factors but with morbid ruminations; may be classified as minimal risk based on the severity of the depressive symptoms  Discharge Diagnoses:   AXIS I:  Alcohol Dependence, depressive disorder NOS AXIS II:  Deferred AXIS III:   Past Medical History  Diagnosis Date  . Hepatitis C   . Chronic back pain   . A-fib   . Depression   . Anxiety   . Hepatitis C     history of  . Chronic back pain   . Acid reflux   . History of urinary frequency   . Peripheral neuropathy     hands and feet  . Hypertension   . Polysubstance abuse 01/25/2011  . Alcohol abuse   . Seizures    AXIS IV:  other psychosocial or environmental problems and problems with primary support group AXIS V:  61-70 mild symptoms  Plan Of Care/Follow-up recommendations:  Activity:  as tolarated Diet:  regular Follow up outpatient  basis/AA Is patient on multiple antipsychotic therapies at discharge:  No   Has Patient had three or more failed trials of antipsychotic monotherapy by history:  No  Recommended Plan for Multiple Antipsychotic Therapies: NA  Waunetta Riggle A 02/22/2013, 12:53 PM

## 2013-02-22 NOTE — Discharge Summary (Signed)
Physician Discharge Summary Note  Patient:  Blake Arnold is an 43 y.o., male MRN:  [id number] DOB:  19-Dec-1970 Patient phone:  [phone] (home)  Patient address:   [address] [place] [address],   Date of Admission:  02/18/2013 Date of Discharge: 02/22/13  Reason for Admission: Alcohol detox  Discharge Diagnoses: Principal Problem:   Alcohol dependence  Review of Systems  Constitutional: Negative.   HENT: Negative.   Eyes: Negative.   Respiratory: Negative.   Cardiovascular: Negative.   Gastrointestinal: Negative.   Genitourinary: Negative.   Musculoskeletal: Negative.   Skin: Negative.   Neurological: Negative.   Endo/Heme/Allergies: Negative.   Psychiatric/Behavioral: Positive for suicidal ideas and substance abuse (Alcoholism). Negative for depression, hallucinations and memory loss. The patient is nervous/anxious (Stabilized with medication prior to discharge) and has insomnia (Stabilized with medication prior to discharge).     DSM5: Schizophrenia Disorders:  NA Obsessive-Compulsive Disorders:  NA Trauma-Stressor Disorders:  NA Substance/Addictive Disorders:  Alcohol Related Disorder - Severe (303.90) Depressive Disorders:  Bipolar affective disorder  Axis Diagnosis:   AXIS I:  Alcohol relted disorder, severe, Bipolar affective disorder, AXIS II:  Deferred AXIS III:   Past Medical History  Diagnosis Date  . Hepatitis C   . Chronic back pain   . A-fib   . Depression   . Anxiety   . Hepatitis C     history of  . Chronic back pain   . Acid reflux   . History of urinary frequency   . Peripheral neuropathy     hands and feet  . Hypertension   . Polysubstance abuse 01/25/2011  . Alcohol abuse   . Seizures    AXIS IV:  other psychosocial or environmental problems and Alcoholism, chronic AXIS V:  62  Level of Care:  OP  Hospital Course: This is yet another admission assessment for this 43 year old Caucasian male. Admitted to [facility] from the  [facility] ED with complaints of suicidal ideations and increased alcohol consumption. Patient reports, "I took the bus to get to the [facility] ED yesterday. I was having suicidal thoughts, a lot of anxiety build-up. I have no idea why this started in the first place for me. The only reason that I can give is that I'm living with my mother and step-father. The problem is not with these 2, rather their place is full of other relatives who are getting on my nerves. I was in this hospital last October, 2014 because of suicidal ideation and increased alcohol consumption. I needed detoxification treatment that time too. I have been drinking quite a bit, but no drug use. I was drinking a pint of Vodka in 2 days. I drink out of habit. I have been doing this for 2-3 weeks. My suicidal ideations started around thanksgiving period. I did not attempt suicide, and I have no history of suicide attempts. I have had one year sobriety from alcoholism in 2012-2013. That time, I was in prison for arm robbery and larceny".  Mr. Pickering came into the hospital with a blood alcohol level of 114 per toxicology reports. However, patient came to the ED for evaluation and possible treatments due to suicidal ideations and increased alcohol consumption. Mr. Kishi was started on medication regimen to restabilize his depressive mood symptoms. He was also started on librium 25 mg PRN to combat any withdrawal symptoms of alcohol that he may be experiencing. He was ordered and received hydroxyzine 25 mg tid for anxiety  symptoms, Risperdal 2 mg Q bedtime for mood control, Gabapentin 300 mg tid for substance withdrawal syndrome, Campral 333 mg (2 tablets) tid for alcohol dependence and Trazodone 100 mg Q bedtime for sleep. He also received medication management and monitoring for his other medical issues and or concerns.  Cordaro was enrolled in group counseling sessions/activities to be taught, counseled and learn coping  skills that should help him cope better after discharge and manage his substance abuse issues for a much longer sobriety. He enrolled and participated in the AA/NA meetings being offered and held within this unit as well due to his history of substance abuse issues. Lyriq presented other medical issues and concerns that required treatment and or monitoring. He received medication management for all those health issues. He tolerated his treatment regimen without any significant adverse effects and or reactions reported.  Patient did respond positively to his treatment regimen. This is evidenced by his daily reports of improved mood, reduction of symptoms and presentation of good affect/eye contact. He attended treatment team meeting this am and met with his treatment team members. His reason for admission, present symptoms, response to treatment and discharge plans discussed with patient. Mr. Sandoval endorsed that his symptoms has stabilized and that he is ready for discharge to pursue psychiatric care on an outpatient basis. It was then agreed upon that patient will follow-up care at the [facility] on 02/23/13 for substance abuse group meetings, 02/26/13 for counseling sessions and walk-in from 08 am - 12 pm for medication management.  He has been instructed and informed that the appointment at the [facility] is a walk-in appointment. The address, dates, times and contact information for [facility] provided for patient in writing.  Upon discharge, Mr. Helmers adamantly denies any suicidal, homicidal ideations, auditory, visual hallucinations, paranoia and or delusional thoughts. He was provided with 14 days worth supply samples of his [facility] discharge medications. He left [facility] with all personal belongings in no apparent distress. Transportation per city bus. Bus fare provided by [facility].  Consults:  psychiatry  Significant Diagnostic Studies:  labs: CBC with diff, CMP, UDS,  toxicology tests  Discharge Vitals:   Blood pressure 143/93, pulse 101, temperature 97.9 F (36.6 C), temperature source Oral, resp. rate 20, height 5' 8"  (1.727 m), weight 93.441 kg (206 lb). Body mass index is 31.33 kg/(m^2). Lab Results:   No results found for this or any previous visit (from the past 72 hour(s)).  Physical Findings: AIMS: Facial and Oral Movements Muscles of Facial Expression: None, normal Lips and Perioral Area: None, normal Jaw: None, normal Tongue: None, normal,Extremity Movements Upper (arms, wrists, hands, fingers): None, normal Lower (legs, knees, ankles, toes): None, normal, Trunk Movements Neck, shoulders, hips: None, normal, Overall Severity Severity of abnormal movements (highest score from questions above): None, normal Incapacitation due to abnormal movements: None, normal Patient's awareness of abnormal movements (rate only patient's report): No Awareness, Dental Status Current problems with teeth and/or dentures?: No Does patient usually wear dentures?: No  CIWA:  CIWA-Ar Total: 2 COWS:  COWS Total Score: 2  Psychiatric Specialty Exam: See Psychiatric Specialty Exam and Suicide Risk Assessment completed by Attending Physician prior to discharge.  Discharge destination:  Home  Is patient on multiple antipsychotic therapies at discharge:  No   Has Patient had three or more failed trials of antipsychotic monotherapy by history:  No  Recommended Plan for Multiple Antipsychotic Therapies: NA     Medication List  STOP taking these medications       oxyCODONE-acetaminophen 5-325 MG per tablet  Commonly known as:  PERCOCET/ROXICET      TAKE these medications     Indication   acamprosate 333 MG tablet  Commonly known as:  CAMPRAL  Take 2 tablets (666 mg total) by mouth 3 (three) times daily with meals. For alcohol dependence   Indication:  Excessive Use of Alcohol     diclofenac 50 MG EC tablet  Commonly known as:  VOLTAREN  Take 1  tablet (50 mg total) by mouth 3 (three) times daily as needed. For joint pain and inflammation.   Indication:  Joint Damage causing Pain and Loss of Function     gabapentin 300 MG capsule  Commonly known as:  NEURONTIN  Take 1 capsule (300 mg total) by mouth 2 (two) times daily. For substance withdrawal syndrome   Indication:  Alcohol Withdrawal Syndrome     hydrOXYzine 25 MG tablet  Commonly known as:  ATARAX/VISTARIL  Take 1 tablet (25 mg) three times daily for anxiety   Indication:  Anxiety associated with Organic Disease     risperiDONE 2 MG tablet  Commonly known as:  RISPERDAL  Take 1 tablet (2 mg total) by mouth at bedtime. For mood control   Indication:  Mood control     traZODone 100 MG tablet  Commonly known as:  DESYREL  Take 1 tablet (100 mg total) by mouth at bedtime. For sleep   Indication:  Trouble Sleeping       Follow-up Information   Schedule an appointment as soon as possible for a visit with [facility]. (Walk in between 8am-12PM for hospital followup/medication management/assessment for therapy services. (Patient stated that appt is scheduled-appt card in wallet). )    Contact information:   [address], [place] [address] Phone: [phone] Fax: [phone]      Follow up with [facility]  On 02/26/2013. (Appt with Luellen Pucker for therapy at [facility].)    Contact information:   [address] [place] [address] Phone: [phone] Fax: [phone]      Follow up with [facility]  On 02/23/2013. (Arrive for Substance Abuse Group at 5:30-6:30PM every Tuesday and Thursday. )    Contact information:   [address] [place] [address] Phone: [phone] Fax: [phone]     Follow-up recommendations: Activity:  As tolerated Diet: As recommended by your primary care doctor. Keep all scheduled follow-up appointments as recommended.  continue to work your relapse  prevention plan Comments:  Take all your medications as prescribed by your mental healthcare provider. Report any adverse effects and or reactions from your medicines to your outpatient provider promptly. Patient is instructed and cautioned to not engage in alcohol and or illegal drug use while on prescription medicines. In the event of worsening symptoms, patient is instructed to call the crisis hotline, 911 and or go to the nearest ED for appropriate evaluation and treatment of symptoms. Follow-up with your primary care provider for your other medical issues, concerns and or health care needs.   Total Discharge Time:  Greater than 30 minutes.  Signed: Encarnacion Slates, PMHNP-BC 02/22/2013, 10:33 AM Agree with assessment and plan Woodroe Chen. Sabra Heck, M.D.

## 2013-02-22 NOTE — Progress Notes (Addendum)
Patient ID: Blake Arnold, male   DOB: [birth year], 43 y.o.   MRN: [id number] He has been up and to groups interacting with peers and staff.  Denies withdrawal symptoms. Has bee attending groups. Self inventory: depression and helpless at o denies w/d symptoms and SI thoughts.

## 2013-02-22 NOTE — Progress Notes (Signed)
Patient ID: Blake Arnold, male   DOB: 1970/11/13, 43 y.o.   MRN: [id number] He has been discharged home and was given a bus pass for transportation home. He voiced understanding of discharge instruction and of the follow up plan.e denies SI thoughts all belonging were taken home with him.

## 2013-02-22 NOTE — [facility] Group Notes (Signed)
[facility] LCSW Group Therapy  02/22/2013 2:19 PM  Type of Therapy:  Group Therapy  Participation Level:  Active  Participation Quality:  Attentive  Affect:  Appropriate  Cognitive:  Alert and Oriented  Insight:  Engaged  Engagement in Therapy:  Improving  Modes of Intervention:  Confrontation, Discussion, Education, Exploration, Problem-solving, Socialization and Support  Summary of Progress/Problems: Today's Topic: Overcoming Obstacles. Pt identified obstacles faced currently and processed barriers involved in overcoming these obstacles. Pt identified steps necessary for overcoming these obstacles and explored motivation (internal and external) for facing these difficulties head on. Pt further identified one area of concern in their lives and chose a skill of focus pulled from their "toolbox." Mukhtar was attentive and engaged throughout today's therapy group. He shared that his biggest obstacle involves returning home to a stressful living environment. "my brother is an alcoholic and I live with six other people, three of whom I don't get along with." Pacen shows progress in the group setting and improving insight AEB his ability to process how he can remain in this living situation temporarily and maintain sobriety. "I am going to groups at [facility] every Tues and Thursday. 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

## 2013-02-25 NOTE — Progress Notes (Signed)
Patient Discharge Instructions:  After Visit Summary (AVS):   Faxed to:  02/25/13 Discharge Summary Note:   Faxed to:  02/25/13 Psychiatric Admission Assessment Note:   Faxed to:  02/25/13 Suicide Risk Assessment - Discharge Assessment:   Faxed to:  02/25/13 Faxed/Sent to the Next Level Care provider:  02/25/13 Faxed to [facility], 02/25/2013, 3:57 PM

## 2013-07-25 ENCOUNTER — Emergency Department (HOSPITAL_COMMUNITY)
Admission: EM | Admit: 2013-07-25 | Discharge: 2013-07-26 | Disposition: A | Discharge status: Other Acute Inpatient Hospital | Payer: Self-pay | Attending: Emergency Medicine | Admitting: Emergency Medicine

## 2013-07-25 ENCOUNTER — Encounter (HOSPITAL_COMMUNITY): Payer: Self-pay | Admitting: Emergency Medicine

## 2013-07-25 ENCOUNTER — Emergency Department (HOSPITAL_COMMUNITY): Payer: Self-pay

## 2013-07-25 DIAGNOSIS — F17200 Nicotine dependence, unspecified, uncomplicated: Secondary | ICD-10-CM | POA: Insufficient documentation

## 2013-07-25 DIAGNOSIS — F3289 Other specified depressive episodes: Secondary | ICD-10-CM | POA: Insufficient documentation

## 2013-07-25 DIAGNOSIS — F329 Major depressive disorder, single episode, unspecified: Secondary | ICD-10-CM | POA: Insufficient documentation

## 2013-07-25 DIAGNOSIS — R0789 Other chest pain: Secondary | ICD-10-CM | POA: Insufficient documentation

## 2013-07-25 DIAGNOSIS — I4891 Unspecified atrial fibrillation: Secondary | ICD-10-CM | POA: Insufficient documentation

## 2013-07-25 DIAGNOSIS — Z79899 Other long term (current) drug therapy: Secondary | ICD-10-CM | POA: Insufficient documentation

## 2013-07-25 DIAGNOSIS — I10 Essential (primary) hypertension: Secondary | ICD-10-CM | POA: Insufficient documentation

## 2013-07-25 DIAGNOSIS — F411 Generalized anxiety disorder: Secondary | ICD-10-CM | POA: Insufficient documentation

## 2013-07-25 DIAGNOSIS — R05 Cough: Secondary | ICD-10-CM | POA: Insufficient documentation

## 2013-07-25 DIAGNOSIS — R059 Cough, unspecified: Secondary | ICD-10-CM | POA: Insufficient documentation

## 2013-07-25 DIAGNOSIS — G40909 Epilepsy, unspecified, not intractable, without status epilepticus: Secondary | ICD-10-CM | POA: Insufficient documentation

## 2013-07-25 DIAGNOSIS — Z8719 Personal history of other diseases of the digestive system: Secondary | ICD-10-CM | POA: Insufficient documentation

## 2013-07-25 DIAGNOSIS — Z8619 Personal history of other infectious and parasitic diseases: Secondary | ICD-10-CM | POA: Insufficient documentation

## 2013-07-25 DIAGNOSIS — F1010 Alcohol abuse, uncomplicated: Secondary | ICD-10-CM | POA: Insufficient documentation

## 2013-07-25 DIAGNOSIS — F1410 Cocaine abuse, uncomplicated: Secondary | ICD-10-CM | POA: Insufficient documentation

## 2013-07-25 DIAGNOSIS — G8929 Other chronic pain: Secondary | ICD-10-CM | POA: Insufficient documentation

## 2013-07-25 LAB — CBC
HEMATOCRIT: 46.3 % (ref 39.0–52.0)
Hemoglobin: 16.5 g/dL (ref 13.0–17.0)
MCH: 33.6 pg (ref 26.0–34.0)
MCHC: 35.6 g/dL (ref 30.0–36.0)
MCV: 94.3 fL (ref 78.0–100.0)
PLATELETS: 260 10*3/uL (ref 150–400)
RBC: 4.91 MIL/uL (ref 4.22–5.81)
RDW: 12.5 % (ref 11.5–15.5)
WBC: 8.5 10*3/uL (ref 4.0–10.5)

## 2013-07-25 LAB — COMPREHENSIVE METABOLIC PANEL
ALT: 51 U/L (ref 0–53)
AST: 52 U/L — ABNORMAL HIGH (ref 0–37)
Albumin: 3.5 g/dL (ref 3.5–5.2)
Alkaline Phosphatase: 66 U/L (ref 39–117)
BILIRUBIN TOTAL: 0.3 mg/dL (ref 0.3–1.2)
BUN: 14 mg/dL (ref 6–23)
CALCIUM: 8.8 mg/dL (ref 8.4–10.5)
CO2: 23 meq/L (ref 19–32)
CREATININE: 1.12 mg/dL (ref 0.50–1.35)
Chloride: 91 mEq/L — ABNORMAL LOW (ref 96–112)
GFR calc Af Amer: >90 mL/min (ref >90)
GFR calc non Af Amer: 78 mL/min — ABNORMAL LOW (ref >90)
Glucose, Bld: 104 mg/dL — ABNORMAL HIGH (ref 70–99)
Potassium: 4.2 mEq/L (ref 3.7–5.3)
Sodium: 132 mEq/L — ABNORMAL LOW (ref 137–147)
TOTAL PROTEIN: 7.6 g/dL (ref 6.0–8.3)

## 2013-07-25 LAB — PROTIME-INR
INR: 0.98 (ref 0.00–1.49)
PROTHROMBIN TIME: 12.8 s (ref 11.6–15.2)

## 2013-07-25 LAB — I-STAT TROPONIN, ED: Troponin i, poc: 0 ng/mL (ref 0.00–0.08)

## 2013-07-25 LAB — ETHANOL: Alcohol, Ethyl (B): 164 mg/dL — ABNORMAL HIGH (ref 0–11)

## 2013-07-25 LAB — SALICYLATE LEVEL: Salicylate Lvl: <2 mg/dL — ABNORMAL LOW (ref 2.8–20.0)

## 2013-07-25 LAB — ACETAMINOPHEN LEVEL

## 2013-07-25 MED ORDER — MORPHINE SULFATE 4 MG/ML IJ SOLN
4.0000 mg | Freq: Once | INTRAMUSCULAR | Status: AC
  Administered 2013-07-25: 4 mg via INTRAVENOUS
  Filled 2013-07-25: qty 1

## 2013-07-25 MED ORDER — ASPIRIN 325 MG PO TABS
325.0000 mg | ORAL_TABLET | ORAL | Status: DC
  Filled 2013-07-25: qty 1

## 2013-07-25 MED ORDER — NITROGLYCERIN 0.4 MG SL SUBL: 0.4000 mg | SUBLINGUAL_TABLET | SUBLINGUAL | Status: DC | PRN

## 2013-07-25 NOTE — ED Notes (Signed)
TTS consult ready.

## 2013-07-25 NOTE — ED Provider Notes (Signed)
CSN: [id number]     Arrival date & time 07/25/13  2136 History   First MD Initiated Contact with Patient 07/25/13 2148     Chief Complaint  Patient presents with  . Chest Pain  . Shortness of Breath  . Addiction Problem     (Consider location/radiation/quality/duration/timing/severity/associated sxs/prior Treatment) HPI Comments: For referral male with history of atrial fibrillation, smoking, substance abuse, alcohol abuse, alcoholic seizures presents with chest tightness and drug abuse/wanting detox. Patient has had constant chest tightness worse with movement the past 2 weeks. No known cardiac history. Patient was cleaned from drugs for 2 months and Thursday use cocaine and started drinking beer again. No injury to call. Patient has intermittent epigastric pain which is chronic for him. He denies suicidal or homicidal ideation. Patient says he is motivated to quit drugs again once detox evidence her workup is unremarkable. No exertional or diaphoretic symptoms  Patient is a 43 y.o. male presenting with chest pain and shortness of breath. The history is provided by the patient.  Chest Pain Associated symptoms: cough and shortness of breath   Associated symptoms: no abdominal pain, no back pain, no fever, no headache and not vomiting   Shortness of Breath Associated symptoms: chest pain and cough   Associated symptoms: no abdominal pain, no fever, no headaches, no neck pain, no rash and no vomiting     Past Medical History  Diagnosis Date  . Hepatitis C   . Chronic back pain   . A-fib   . Depression   . Anxiety   . Hepatitis C     history of  . Chronic back pain   . Acid reflux   . History of urinary frequency   . Peripheral neuropathy     hands and feet  . Hypertension   . Polysubstance abuse 01/25/2011  . Alcohol abuse   . Seizures    Past Surgical History  Procedure Laterality Date  . Cardioversion  03/07/2006   History reviewed. No pertinent family history. History   Substance Use Topics  . Smoking status: Current Every Day Smoker -- 2.00 packs/day for 25 years    Types: Cigarettes  . Smokeless tobacco: Former Systems developer    Quit date: 10/24/2012  . Alcohol Use: 0.0 oz/week     Comment: 1/5 Vodka; 1-40oz Daily     Review of Systems  Constitutional: Negative for fever and chills.  HENT: Negative for congestion.   Eyes: Negative for visual disturbance.  Respiratory: Positive for cough and shortness of breath.   Cardiovascular: Positive for chest pain. Negative for leg swelling.  Gastrointestinal: Negative for vomiting and abdominal pain.  Genitourinary: Negative for dysuria and flank pain.  Musculoskeletal: Negative for back pain, neck pain and neck stiffness.  Skin: Negative for rash.  Neurological: Negative for light-headedness and headaches.      Allergies  Review of patient's allergies indicates no known allergies.  Home Medications   Prior to Admission medications   Medication Sig Start Date End Date Taking? Authorizing Provider  acamprosate (CAMPRAL) 333 MG tablet Take 2 tablets (666 mg total) by mouth 3 (three) times daily with meals. For alcohol dependence 02/22/13  Yes Encarnacion Slates, NP  diclofenac (VOLTAREN) 50 MG EC tablet Take 50 mg by mouth 3 (three) times daily as needed (joint pain/ inflammation).   Yes Historical Provider, MD  gabapentin (NEURONTIN) 300 MG capsule Take 1 capsule (300 mg total) by mouth 2 (two) times daily. For substance withdrawal syndrome 02/22/13  Yes  Encarnacion Slates, NP  lisinopril (PRINIVIL,ZESTRIL) 10 MG tablet Take 10 mg by mouth daily.   Yes Historical Provider, MD  risperiDONE (RISPERDAL) 2 MG tablet Take 1 tablet (2 mg total) by mouth at bedtime. For mood control 02/22/13  Yes Encarnacion Slates, NP   BP 126/78  Pulse 101  Temp(Src) 98.5 F (36.9 C) (Oral)  Resp 15  Ht 5\' 8"  (1.727 m)  Wt 215 lb (97.523 kg)  BMI 32.70 kg/m2  SpO2 96% Physical Exam  Nursing note and vitals reviewed. Constitutional: He is  oriented to person, place, and time. He appears well-developed and well-nourished.  HENT:  Head: Normocephalic and atraumatic.  Eyes: Conjunctivae are normal. Right eye exhibits no discharge. Left eye exhibits no discharge.  Neck: Normal range of motion. Neck supple. No tracheal deviation present.  Cardiovascular: Normal rate, regular rhythm and intact distal pulses.   No murmur heard. Pulmonary/Chest: Effort normal and breath sounds normal.  Abdominal: Soft. He exhibits no distension. There is no tenderness. There is no guarding.  Musculoskeletal: He exhibits no edema and no tenderness.  Neurological: He is alert and oriented to person, place, and time.  Skin: Skin is warm. No rash noted.  Psychiatric: He has a normal mood and affect.    ED Course  Procedures (including critical care time) Labs Review Labs Reviewed  COMPREHENSIVE METABOLIC PANEL - Abnormal; Notable for the following:    Sodium 132 (*)    Chloride 91 (*)    Glucose, Bld 104 (*)    AST 52 (*)    GFR calc non Af Amer 78 (*)    All other components within normal limits  SALICYLATE LEVEL - Abnormal; Notable for the following:    Salicylate Lvl <8.2 (*)    All other components within normal limits  ETHANOL - Abnormal; Notable for the following:    Alcohol, Ethyl (B) 164 (*)    All other components within normal limits  CBC  PROTIME-INR  ACETAMINOPHEN LEVEL  URINE RAPID DRUG SCREEN (HOSP PERFORMED)  TROPONIN I  Randolm Idol, ED    Imaging Review Dg Chest 2 View  07/25/2013   CLINICAL DATA:  Midsternal chest pain radiating down the left arm for 2 weeks. Short of breath.  EXAM: CHEST  2 VIEW  COMPARISON:  02/17/2013.  FINDINGS: Cardiopericardial silhouette within normal limits. Mediastinal contours normal. Trachea midline. No airspace disease or effusion. Low volumes are present. Mild basilar atelectasis.  IMPRESSION: No active cardiopulmonary disease.   Electronically Signed   By: Dereck Ligas M.D.   On:  07/25/2013 23:26     EKG Interpretation   Date/Time:  Sunday July 25 2013 21:41:21 EDT Ventricular Rate:  95 PR Interval:  180 QRS Duration: 95 QT Interval:  342 QTC Calculation: 430 R Axis:   8 Text Interpretation:  Sinus rhythm Anterior infarct, old similar to  previous Confirmed by Kriston Pasquarello  MD, Trai Ells ([id number]) on 07/26/2013 12:41:21 AM      MDM   Final diagnoses:  None   Patient with polysubstance abuse history and recent drug abuse. Chest pain is very atypical and costovertebral weeks, with cocaine, smoking history plan for delta troponin and is unremarkable patient will be moved applied C. for behavior health evaluation in the morning. Minimal chest discomfort this time. Patient does not have a risk factors for blood clots and no recent surgeries or leg pain or leg swelling.  The patients results and plan were reviewed and discussed.   Any x-rays performed  were personally reviewed by myself.   Behavior health evaluated and feels patient meets inpatient criteria, placement pending. Differential diagnosis were considered with the presenting HPI.  Medications  aspirin tablet 325 mg (325 mg Oral Not Given 07/25/13 2157)  nitroGLYCERIN (NITROSTAT) SL tablet 0.4 mg (not administered)  morphine 4 MG/ML injection 4 mg (not administered)   Chest pain atypical, cocaine abuse, alcohol abuse  Filed Vitals:   07/25/13 2145  BP: 126/78  Pulse: 101  Temp: 98.5 F (36.9 C)  TempSrc: Oral  Resp: 15  Height: 5\' 8"  (1.727 m)  Weight: 215 lb (97.523 kg)  SpO2: 96%       Mariea Clonts, MD 07/26/13 0041

## 2013-07-25 NOTE — ED Notes (Addendum)
Pt reports he has been experiencing central chest pain x 2 days with shortness of breath.  Was clean from alcohol and drugs for 2 months, smoked crack 4 nights ago, has been drinking beer daily since.  Reports less than 2-40 oz beers today.  Wants detox also. Was given ASA 325mg  po and ntg en route by EMS.

## 2013-07-25 NOTE — BH Assessment (Signed)
Called to speak with Dr. Reather Converse prior to TA. Dr. Steffanie Dunn reports Pt presents with chest pains, and concerns about his alcohol [place]. Pt reports he had been clean for 2 months, and then smoked crack 4 nights ago. Pt has been drinking beer since that time. Pt is requesting help. Dr. Reather Converse is looking for recommendations regarding level of care.   Nurse will set up equipment.   Lear Ng, [facility] Triage Specialist 07/25/2013 11:16 PM

## 2013-07-26 ENCOUNTER — Encounter (HOSPITAL_COMMUNITY): Payer: Self-pay | Admitting: *Deleted

## 2013-07-26 ENCOUNTER — Observation Stay (HOSPITAL_COMMUNITY)
Admission: EM | Admit: 2013-07-26 | Discharge: 2013-07-27 | Disposition: A | Discharge status: Home / Self Care | Payer: Self-pay | Source: Intra-hospital | Attending: Psychiatry | Admitting: Psychiatry

## 2013-07-26 DIAGNOSIS — F332 Major depressive disorder, recurrent severe without psychotic features: Secondary | ICD-10-CM | POA: Insufficient documentation

## 2013-07-26 DIAGNOSIS — F1010 Alcohol abuse, uncomplicated: Principal | ICD-10-CM | POA: Insufficient documentation

## 2013-07-26 DIAGNOSIS — F1994 Other psychoactive substance use, unspecified with psychoactive substance-induced mood disorder: Secondary | ICD-10-CM | POA: Insufficient documentation

## 2013-07-26 DIAGNOSIS — F1014 Alcohol abuse with alcohol-induced mood disorder: Secondary | ICD-10-CM | POA: Diagnosis present

## 2013-07-26 DIAGNOSIS — F17200 Nicotine dependence, unspecified, uncomplicated: Secondary | ICD-10-CM | POA: Insufficient documentation

## 2013-07-26 DIAGNOSIS — I10 Essential (primary) hypertension: Secondary | ICD-10-CM | POA: Insufficient documentation

## 2013-07-26 DIAGNOSIS — K219 Gastro-esophageal reflux disease without esophagitis: Secondary | ICD-10-CM | POA: Insufficient documentation

## 2013-07-26 DIAGNOSIS — F1410 Cocaine abuse, uncomplicated: Secondary | ICD-10-CM | POA: Insufficient documentation

## 2013-07-26 DIAGNOSIS — G8929 Other chronic pain: Secondary | ICD-10-CM | POA: Insufficient documentation

## 2013-07-26 DIAGNOSIS — G609 Hereditary and idiopathic neuropathy, unspecified: Secondary | ICD-10-CM | POA: Insufficient documentation

## 2013-07-26 DIAGNOSIS — I4891 Unspecified atrial fibrillation: Secondary | ICD-10-CM | POA: Insufficient documentation

## 2013-07-26 DIAGNOSIS — B1920 Unspecified viral hepatitis C without hepatic coma: Secondary | ICD-10-CM | POA: Insufficient documentation

## 2013-07-26 DIAGNOSIS — M549 Dorsalgia, unspecified: Secondary | ICD-10-CM | POA: Insufficient documentation

## 2013-07-26 LAB — TROPONIN I: Troponin I: <0.3 ng/mL (ref <0.30)

## 2013-07-26 LAB — RAPID URINE DRUG SCREEN, HOSP PERFORMED
Amphetamines: NOT DETECTED
BARBITURATES: NOT DETECTED
BENZODIAZEPINES: NOT DETECTED
COCAINE: NOT DETECTED
Opiates: NOT DETECTED
TETRAHYDROCANNABINOL: NOT DETECTED

## 2013-07-26 MED ORDER — DICLOFENAC SODIUM 25 MG PO TBEC: 50.0000 mg | DELAYED_RELEASE_TABLET | Freq: Three times a day (TID) | ORAL | Status: DC | PRN

## 2013-07-26 MED ORDER — ADULT MULTIVITAMIN W/MINERALS CH
1.0000 | ORAL_TABLET | Freq: Every day | ORAL | Status: DC
  Administered 2013-07-27: 1 via ORAL
  Filled 2013-07-26 (×3): qty 1

## 2013-07-26 MED ORDER — LORAZEPAM 1 MG PO TABS: 0.0000 mg | ORAL_TABLET | Freq: Two times a day (BID) | ORAL | Status: DC

## 2013-07-26 MED ORDER — NICOTINE 14 MG/24HR TD PT24
14.0000 mg | MEDICATED_PATCH | Freq: Every day | TRANSDERMAL | Status: DC
  Filled 2013-07-26 (×3): qty 1

## 2013-07-26 MED ORDER — THIAMINE HCL 100 MG/ML IJ SOLN: 100.0000 mg | Freq: Once | INTRAMUSCULAR | Status: DC

## 2013-07-26 MED ORDER — RISPERIDONE 2 MG PO TABS
2.0000 mg | ORAL_TABLET | Freq: Every day | ORAL | Status: DC
  Administered 2013-07-26: 2 mg via ORAL
  Filled 2013-07-26 (×3): qty 1

## 2013-07-26 MED ORDER — ONDANSETRON 4 MG PO TBDP: 4.0000 mg | ORAL_TABLET | Freq: Four times a day (QID) | ORAL | Status: DC | PRN

## 2013-07-26 MED ORDER — VITAMIN B-1 100 MG PO TABS
100.0000 mg | ORAL_TABLET | Freq: Every day | ORAL | Status: DC
  Administered 2013-07-27: 100 mg via ORAL
  Filled 2013-07-26 (×3): qty 1

## 2013-07-26 MED ORDER — CHLORDIAZEPOXIDE HCL 25 MG PO CAPS
25.0000 mg | ORAL_CAPSULE | Freq: Four times a day (QID) | ORAL | Status: DC | PRN
  Filled 2013-07-26: qty 1

## 2013-07-26 MED ORDER — ALUM & MAG HYDROXIDE-SIMETH 200-200-20 MG/5ML PO SUSP: 30.0000 mL | ORAL | Status: DC | PRN

## 2013-07-26 MED ORDER — ACAMPROSATE CALCIUM 333 MG PO TBEC
666.0000 mg | DELAYED_RELEASE_TABLET | Freq: Three times a day (TID) | ORAL | Status: DC
  Administered 2013-07-27 (×2): 666 mg via ORAL
  Filled 2013-07-26 (×7): qty 2

## 2013-07-26 MED ORDER — LOPERAMIDE HCL 2 MG PO CAPS: 2.0000 mg | ORAL_CAPSULE | ORAL | Status: DC | PRN

## 2013-07-26 MED ORDER — RISPERIDONE 0.5 MG PO TABS: 2.0000 mg | ORAL_TABLET | Freq: Every day | ORAL | Status: DC

## 2013-07-26 MED ORDER — GABAPENTIN 300 MG PO CAPS
300.0000 mg | ORAL_CAPSULE | Freq: Two times a day (BID) | ORAL | Status: DC
  Administered 2013-07-27: 300 mg via ORAL
  Filled 2013-07-26 (×3): qty 1

## 2013-07-26 MED ORDER — GABAPENTIN 300 MG PO CAPS
300.0000 mg | ORAL_CAPSULE | Freq: Two times a day (BID) | ORAL | Status: DC
  Administered 2013-07-26: 300 mg via ORAL
  Filled 2013-07-26: qty 1

## 2013-07-26 MED ORDER — LISINOPRIL 10 MG PO TABS
10.0000 mg | ORAL_TABLET | Freq: Every day | ORAL | Status: DC
  Administered 2013-07-26: 10 mg via ORAL
  Filled 2013-07-26: qty 1

## 2013-07-26 MED ORDER — THIAMINE HCL 100 MG/ML IJ SOLN: 100.0000 mg | Freq: Every day | INTRAMUSCULAR | Status: DC

## 2013-07-26 MED ORDER — CHLORDIAZEPOXIDE HCL 25 MG PO CAPS
25.0000 mg | ORAL_CAPSULE | Freq: Four times a day (QID) | ORAL | Status: DC
  Administered 2013-07-26 – 2013-07-27 (×2): 25 mg via ORAL
  Filled 2013-07-26 (×2): qty 1

## 2013-07-26 MED ORDER — LORAZEPAM 1 MG PO TABS
0.0000 mg | ORAL_TABLET | Freq: Four times a day (QID) | ORAL | Status: DC
  Administered 2013-07-26 (×2): 1 mg via ORAL
  Filled 2013-07-26 (×2): qty 1

## 2013-07-26 MED ORDER — VITAMIN B-1 100 MG PO TABS
100.0000 mg | ORAL_TABLET | Freq: Every day | ORAL | Status: DC
  Administered 2013-07-26: 100 mg via ORAL
  Filled 2013-07-26: qty 1

## 2013-07-26 MED ORDER — MAGNESIUM HYDROXIDE 400 MG/5ML PO SUSP: 30.0000 mL | Freq: Every day | ORAL | Status: DC | PRN

## 2013-07-26 MED ORDER — ACETAMINOPHEN 325 MG PO TABS: 650.0000 mg | ORAL_TABLET | Freq: Four times a day (QID) | ORAL | Status: DC | PRN

## 2013-07-26 MED ORDER — CHLORDIAZEPOXIDE HCL 25 MG PO CAPS: 25.0000 mg | ORAL_CAPSULE | ORAL | Status: DC

## 2013-07-26 MED ORDER — CHLORDIAZEPOXIDE HCL 25 MG PO CAPS: 25.0000 mg | ORAL_CAPSULE | Freq: Every day | ORAL | Status: DC

## 2013-07-26 MED ORDER — CHLORDIAZEPOXIDE HCL 25 MG PO CAPS: 25.0000 mg | ORAL_CAPSULE | Freq: Three times a day (TID) | ORAL | Status: DC

## 2013-07-26 MED ORDER — HYDROXYZINE HCL 25 MG PO TABS: 25.0000 mg | ORAL_TABLET | Freq: Four times a day (QID) | ORAL | Status: DC | PRN

## 2013-07-26 MED ORDER — ACETAMINOPHEN 325 MG PO TABS
650.0000 mg | ORAL_TABLET | ORAL | Status: DC | PRN
  Administered 2013-07-26: 650 mg via ORAL
  Filled 2013-07-26: qty 2

## 2013-07-26 MED ORDER — LISINOPRIL 10 MG PO TABS
10.0000 mg | ORAL_TABLET | Freq: Every day | ORAL | Status: DC
  Administered 2013-07-27: 10 mg via ORAL
  Filled 2013-07-26 (×3): qty 1

## 2013-07-26 MED ORDER — TRAZODONE HCL 50 MG PO TABS
50.0000 mg | ORAL_TABLET | Freq: Every evening | ORAL | Status: DC | PRN
  Administered 2013-07-26: 50 mg via ORAL
  Filled 2013-07-26 (×4): qty 1

## 2013-07-26 MED ORDER — DICLOFENAC SODIUM 50 MG PO TBEC
50.0000 mg | DELAYED_RELEASE_TABLET | Freq: Three times a day (TID) | ORAL | Status: DC | PRN
  Filled 2013-07-26: qty 1

## 2013-07-26 NOTE — ED Notes (Signed)
Per Otila Kluver at [facility], pt still pending for a bed.

## 2013-07-26 NOTE — ED Notes (Signed)
[facility] called for transport. 

## 2013-07-26 NOTE — Progress Notes (Signed)
[facility] INPATIENT:  Family/Significant Other Suicide Prevention Education  Suicide Prevention Education:  Patient Refusal for Family/Significant Other Suicide Prevention Education: The patient Blake Arnold has refused to provide written consent for family/significant other to be provided Family/Significant Other Suicide Prevention Education during admission and/or prior to discharge.  Pt states too late to call his mother Blake Arnold [phone]  [name] 07/26/2013, 11:15 PM
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
## 2013-07-26 NOTE — BH Assessment (Addendum)
Completed TA, and called to confer with Serena Colonel, NP. Serena Colonel, NP request more information regarding Pt chest pain. Serena Colonel, PA will review chart to determine level of care recommendation.   Spoke with Bard, RN she is going to call to see why results of UDS have not come back. Pt reports he is still having intermittent chest pain, especially when he breaths in. Nurse reports Pt smokes 2 packs a day and appears anxious. Negative for active heart disease.   Per Serena Colonel, NP Pt meets criteria for in-patient detox but will need to be referred out. Dr. Reather Converse is in agreement. Notified Barb, RN that TTS is seeking in patient detox for Pt. Nurse will inform Pt. TTS will inform Pt and nurse as soon as a bed is identified.   Lear Ng, [facility] Triage Specialist 07/26/2013 12:34 AM

## 2013-07-26 NOTE — ED Provider Notes (Signed)
Pt agrees to transfer. 2040  Babette Relic, MD 07/27/13 [phone]

## 2013-07-26 NOTE — Plan of Care (Signed)
[facility] Crisis Plan  Reason for Crisis Plan:  Crisis Stabilization and Substance Abuse   Plan of Care:  Referral for Substance Abuse  Family Support:    Oswaldo Conroy mother  Current Living Environment:  Living Arrangements: Parent  Insurance:   Hospital Account   Name Acct ID Class Status Primary Coverage   Raymondo, Garcialopez [id number] [place] Open None        Guarantor Account (for Hospital Account [id number])   Name Relation to Pt Service Area Active? Acct Type   Charna [facility]   Address Phone       [address] [place], [place] [address] [phone])          Coverage Information (for Hospital Account [id number])   Not on file      Legal Guardian:   none  Primary Care Provider:  Elbert Ewings, [place]  Current Outpatient Providers:  Family services  Psychiatrist:   "I see a Morton Peters don't know is she is a psychiatrist"  Counselor/Therapist:   "Audrey"  Compliant with Medications:  No  Additional Information:   [name] 6/15/201510:07 PM

## 2013-07-26 NOTE — ED Notes (Addendum)
States recently started drinking again after being sober for 2 months-- has detoxed at [facility], no long term inpatient treatment.

## 2013-07-27 DIAGNOSIS — F1994 Other psychoactive substance use, unspecified with psychoactive substance-induced mood disorder: Secondary | ICD-10-CM

## 2013-07-27 DIAGNOSIS — F332 Major depressive disorder, recurrent severe without psychotic features: Secondary | ICD-10-CM

## 2013-07-27 DIAGNOSIS — F1010 Alcohol abuse, uncomplicated: Secondary | ICD-10-CM

## 2013-07-27 MED ORDER — RISPERIDONE 2 MG PO TABS: 2.0000 mg | ORAL_TABLET | Freq: Every day | ORAL | Status: DC

## 2013-07-27 MED ORDER — LISINOPRIL 10 MG PO TABS: 10.0000 mg | ORAL_TABLET | Freq: Every day | ORAL | Status: DC

## 2013-07-27 MED ORDER — DICLOFENAC SODIUM 50 MG PO TBEC: 50.0000 mg | DELAYED_RELEASE_TABLET | Freq: Three times a day (TID) | ORAL | Status: DC | PRN

## 2013-07-27 MED ORDER — ACAMPROSATE CALCIUM 333 MG PO TBEC: 666.0000 mg | DELAYED_RELEASE_TABLET | Freq: Three times a day (TID) | ORAL | Status: DC

## 2013-07-27 NOTE — Progress Notes (Signed)
Patient discharged with AVS, prescriptions and belongings. Patient alert and oriented, denies SI/HI and voices intent to comply with discharge plan. No further questions voiced.

## 2013-07-27 NOTE — Discharge Summary (Signed)
[facility] OBS UNIT DISCHARGE SUMMARY & SRA   *Pt seen and chart reviewed. Due to short length of stay in OBS UNIT, only one evaluation for this encounter. Summary below.  Patient Identification:  Blake Arnold Date of Evaluation:  07/27/2013 Chief Complaint:  Alcohol use disorder severe  Subjective: Pt seen and chart reviewed. Pt reports that he feels much better now and is denying all detox/withdrawal symptoms. Pt also denies SI, HI, and AVH, contracts for safety. Pt will be seeking outpatient treatment.   History of Present Illness:: Blake Arnold is an 43 y.o. male. Pt present to the emergency room for chest pains and request for detox. Pt reports he had been sober for two months, but began drinking five days ago. Pt indicates he has been drinking a case of beer a day since his relapse. Pt denies known trigger for this relapse, but notes contact with his ex-girlfriend has been a trigger in the past. Pt reports he also took cocaine 4 days ago. Pt reports alcohol and substance use run in his family, and that there is drinking in his home. Pt reports he does not feel he can achieve initial sobriety in his home. Pt is angry and despondent about his relapse. Pt denies current SI/SA. Pt denies HI/HA. Pt reports in the past he thought he could drink himself to death due to having Hep C. He worries his drinking will have fatal consequences. Pt currently sees providers at [facility] for OP counseling for depression, etoh, and medication management. Pt reports he has not been taking his Risperdal as prescribed. Pt reports weight gain and increase appetite with medication, and last time taking it was 5 days ago, prior to starting drinking again. Pt also missed his OP session this week due to returning to drinking.  Pt reports he has history of Major Depression, and began to hear voices when depressed about 1.5 years ago. Pt indicated he hears "millions of voices" but can not decipher what  they are saying. He reports this is scary and irritating to him. Pt reports the only time he does not feel depressed is when he is drinking. He reports sx of tearfulness, hopelessness, increased irritability, being quick to anger, and being disappointed with himself.   Total Time spent with patient: 40 minutes  Psychiatric Specialty Exam: Physical Exam  Review of Systems  Constitutional: Negative.   HENT: Negative.   Eyes: Negative.   Respiratory: Negative.   Cardiovascular: Negative.   Gastrointestinal: Negative.   Genitourinary: Negative.   Musculoskeletal: Negative.   Skin: Negative.   Neurological: Negative.   Endo/Heme/Allergies: Negative.   Psychiatric/Behavioral: Positive for depression. The patient is nervous/anxious.     Blood pressure 129/87, pulse 102, temperature 97.8 F (36.6 C), temperature source Oral, resp. rate 18, height _0  (1.727 m), weight 97.523 kg (215 lb), SpO2 100.00%.Body mass index is 32.7 kg/(m^2).  General Appearance: Casual  Eye Contact::  Good  Speech:  Clear and Coherent  Volume:  Normal  Mood:  Euthymic  Affect:  Appropriate and Congruent  Thought Process:  Coherent and Goal Directed  Orientation:  Full (Time, Place, and Person)  Thought Content:  WDL  Suicidal Thoughts:  No  Homicidal Thoughts:  No  Memory:  Immediate;   Fair Recent;   Fair Remote;   Fair  Judgement:  Fair  Insight:  Fair  Psychomotor Activity:  Normal  Concentration:  Good  Recall:  [employer]  Language:  Good  Akathisia:  No  Handed:    AIMS (if indicated):     Assets:  Communication Skills Desire for Improvement Resilience  Sleep:       Musculoskeletal: Strength & Muscle Tone: within normal limits Gait & Station: normal Patient leans: N/A   Past Medical History:   Past Medical History  Diagnosis Date  . Hepatitis C   . Chronic back pain   . A-fib   . Depression   . Anxiety   . Hepatitis C     history of  . Chronic back pain    . Acid reflux   . History of urinary frequency   . Peripheral neuropathy     hands and feet  . Hypertension   . Polysubstance abuse 01/25/2011  . Alcohol abuse   . Seizures    None. Allergies:  No Known Allergies PTA Medications: Prescriptions prior to admission  Medication Sig Dispense Refill  . acamprosate (CAMPRAL) 333 MG tablet Take 2 tablets (666 mg total) by mouth 3 (three) times daily with meals. For alcohol dependence  90 tablet  0  . diclofenac (VOLTAREN) 50 MG EC tablet Take 50 mg by mouth 3 (three) times daily as needed (joint pain/ inflammation).      . gabapentin (NEURONTIN) 300 MG capsule Take 1 capsule (300 mg total) by mouth 2 (two) times daily. For substance withdrawal syndrome  60 capsule  0  . lisinopril (PRINIVIL,ZESTRIL) 10 MG tablet Take 10 mg by mouth daily.      . risperiDONE (RISPERDAL) 2 MG tablet Take 1 tablet (2 mg total) by mouth at bedtime. For mood control  30 tablet  0    Previous Psychotropic Medications:  Medication/Dose                 Substance Abuse History in the last 12 months:  yes  Consequences of Substance Abuse: Medical Consequences:  Hospitalization  Social History:  reports that he has been smoking Cigarettes.  He has a 50 pack-year smoking history. He quit smokeless tobacco use about 9 months ago. He reports that he drinks alcohol. He reports that he uses illicit drugs ("Crack" cocaine and Cocaine). Additional Social History: Pain Medications: denies abuse Prescriptions: denies abuse Over the Counter: denies abuse History of alcohol / drug use?: Yes Longest period of sobriety (when/how long): 1 1/2 in 2012 Negative Consequences of Use: Financial;Personal relationships;Work / School Withdrawal Symptoms: Tremors;Irritability;Seizures;Cramps;Sweats;Diarrhea;Nausea / Vomiting Onset of Seizures: pt reports seizure 8 months when tried to stop drinking Date of most recent seizure: pt reports seizure 8 months when tried to stop  drinking Name of Substance 1: alcohol 1 - Age of First Use: 43 yrs old 1 - Amount (size/oz): up to 24-30 beers daily or several bottles of wine  1 - Frequency: relapsed last thursday 07/22/13 was sober since May 30, 2013 1 - Duration: 24month 1 - Last Use / Amount: 07/25/13 _0  Name of Substance 2: cocaine 2 - Age of First Use: 17 2 - Amount (size/oz): "1/4 gram" 2 - Frequency: "few times a year" 2 - Last Use / Amount: 07/22/13                 Family History:  History reviewed. No pertinent family history.  Results for orders placed during the hospital encounter of 07/25/13 (from the past 72 hour(s))  CBC     Status: None   Collection Time    07/25/13  9:56 PM      Result  Value Ref Range   WBC 8.5  4.0 - 10.5 K/uL   RBC 4.91  4.22 - 5.81 MIL/uL   Hemoglobin 16.5  13.0 - 17.0 g/dL   HCT 46.3  39.0 - 52.0 %   MCV 94.3  78.0 - 100.0 fL   MCH 33.6  26.0 - 34.0 pg   MCHC 35.6  30.0 - 36.0 g/dL   RDW 12.5  11.5 - 15.5 %   Platelets 260  150 - 400 K/uL  PROTIME-INR     Status: None   Collection Time    07/25/13  9:56 PM      Result Value Ref Range   Prothrombin Time 12.8  11.6 - 15.2 seconds   INR 0.98  0.00 - 1.49  COMPREHENSIVE METABOLIC PANEL     Status: Abnormal   Collection Time    07/25/13  9:56 PM      Result Value Ref Range   Sodium 132 (*) 137 - 147 mEq/L   Potassium 4.2  3.7 - 5.3 mEq/L   Chloride 91 (*) 96 - 112 mEq/L   CO2 23  19 - 32 mEq/L   Glucose, Bld 104 (*) 70 - 99 mg/dL   BUN 14  6 - 23 mg/dL   Creatinine, Ser 1.12  0.50 - 1.35 mg/dL   Calcium 8.8  8.4 - 10.5 mg/dL   Total Protein 7.6  6.0 - 8.3 g/dL   Albumin 3.5  3.5 - 5.2 g/dL   AST 52 (*) 0 - 37 U/L   ALT 51  0 - 53 U/L   Alkaline Phosphatase 66  39 - 117 U/L   Total Bilirubin 0.3  0.3 - 1.2 mg/dL   GFR calc non Af Amer 78 (*) >90 mL/min   GFR calc Af Amer >90  >90 mL/min   Comment: (NOTE)     The eGFR has been calculated using the CKD EPI equation.     This calculation has not been  validated in all clinical situations.     eGFR's persistently <90 mL/min signify possible Chronic Kidney     Disease.  SALICYLATE LEVEL     Status: Abnormal   Collection Time    07/25/13  9:56 PM      Result Value Ref Range   Salicylate Lvl <5.7 (*) 2.8 - 20.0 mg/dL  ACETAMINOPHEN LEVEL     Status: None   Collection Time    07/25/13  9:56 PM      Result Value Ref Range   Acetaminophen (Tylenol), Serum <15.0  10 - 30 ug/mL   Comment:            THERAPEUTIC CONCENTRATIONS VARY     SIGNIFICANTLY. A RANGE OF 10-30     ug/mL MAY BE AN EFFECTIVE     CONCENTRATION FOR MANY PATIENTS.     HOWEVER, SOME ARE BEST TREATED     AT CONCENTRATIONS OUTSIDE THIS     RANGE.     ACETAMINOPHEN CONCENTRATIONS     >150 ug/mL AT 4 HOURS AFTER     INGESTION AND >50 ug/mL AT 12     HOURS AFTER INGESTION ARE     OFTEN ASSOCIATED WITH TOXIC     REACTIONS.  ETHANOL     Status: Abnormal   Collection Time    07/25/13  9:56 PM      Result Value Ref Range   Alcohol, Ethyl (B) 164 (*) 0 - 11 mg/dL   Comment:  LOWEST DETECTABLE LIMIT FOR     SERUM ALCOHOL IS 11 mg/dL     FOR MEDICAL PURPOSES ONLY  I-STAT TROPOININ, ED     Status: None   Collection Time    07/25/13 10:02 PM      Result Value Ref Range   Troponin i, poc 0.00  0.00 - 0.08 ng/mL   Comment 3            Comment: Due to the release kinetics of cTnI,     a negative result within the first hours     of the onset of symptoms does not rule out     myocardial infarction with certainty.     If myocardial infarction is still suspected,     repeat the test at appropriate intervals.  TROPONIN I     Status: None   Collection Time    07/26/13 12:20 AM      Result Value Ref Range   Troponin I <0.30  <0.30 ng/mL   Comment:            Due to the release kinetics of cTnI,     a negative result within the first hours     of the onset of symptoms does not rule out     myocardial infarction with certainty.     If myocardial infarction is  still suspected,     repeat the test at appropriate intervals.  URINE RAPID DRUG SCREEN (HOSP PERFORMED)     Status: None   Collection Time    07/26/13 12:27 AM      Result Value Ref Range   Opiates NONE DETECTED  NONE DETECTED   Cocaine NONE DETECTED  NONE DETECTED   Benzodiazepines NONE DETECTED  NONE DETECTED   Amphetamines NONE DETECTED  NONE DETECTED   Tetrahydrocannabinol NONE DETECTED  NONE DETECTED   Barbiturates NONE DETECTED  NONE DETECTED   Comment:            DRUG SCREEN FOR MEDICAL PURPOSES     ONLY.  IF CONFIRMATION IS NEEDED     FOR ANY PURPOSE, NOTIFY LAB     WITHIN 5 DAYS.                LOWEST DETECTABLE LIMITS     FOR URINE DRUG SCREEN     Drug Class       Cutoff (ng/mL)     Amphetamine      1000     Barbiturate      200     Benzodiazepine   027     Tricyclics       253     Opiates          300     Cocaine          300     THC              50   Psychological Evaluations:  Assessment:   DSM5:  Substance/Addictive Disorders:  Alcohol Related Disorder - Severe (303.90) Depressive Disorders:  Major Depressive Disorder - Severe (296.23)  AXIS I:  Alcohol Abuse, Major Depression, Recurrent severe and Substance Induced Mood Disorder AXIS II:  Deferred AXIS III:   Past Medical History  Diagnosis Date  . Hepatitis C   . Chronic back pain   . A-fib   . Depression   . Anxiety   . Hepatitis C     history of  . Chronic back pain   .  Acid reflux   . History of urinary frequency   . Peripheral neuropathy     hands and feet  . Hypertension   . Polysubstance abuse 01/25/2011  . Alcohol abuse   . Seizures    AXIS IV:  other psychosocial or environmental problems and problems related to social environment AXIS V:  61-70 mild symptoms  Treatment Plan/Recommendations:   Review of chart, vital signs, medications, and notes.  1-Medication management for depression and anxiety: Medications reviewed with the patient and she stated no untoward effects,  unchanged. 2-Coping skills for depression, anxiety  3-Continue crisis stabilization and management  4-Address health issues--monitoring vital signs, stable  5-Treatment plan in progress to prevent relapse of depression and anxiety  Treatment Plan Summary: Daily contact with patient to assess and evaluate symptoms and progress in treatment Medication management Current Medications:  Current Facility-Administered Medications  Medication Dose Route Frequency Vonette Grosso Last Rate Last Dose  . acamprosate (CAMPRAL) tablet 666 mg  666 mg Oral TID WC Laverle Hobby, PA-C   666 mg at 07/27/13 6440  . acetaminophen (TYLENOL) tablet 650 mg  650 mg Oral Q6H PRN Laverle Hobby, PA-C      . alum & mag hydroxide-simeth (MAALOX/MYLANTA) 200-200-20 MG/5ML suspension 30 mL  30 mL Oral Q4H PRN Laverle Hobby, PA-C      . chlordiazePOXIDE (LIBRIUM) capsule 25 mg  25 mg Oral Q6H PRN Laverle Hobby, PA-C      . chlordiazePOXIDE (LIBRIUM) capsule 25 mg  25 mg Oral QID Laverle Hobby, PA-C   25 mg at 07/27/13 0749   Followed by  . [START ON 07/28/2013] chlordiazePOXIDE (LIBRIUM) capsule 25 mg  25 mg Oral TID Laverle Hobby, PA-C       Followed by  . [START ON 07/29/2013] chlordiazePOXIDE (LIBRIUM) capsule 25 mg  25 mg Oral BH-qamhs Spencer E Simon, PA-C       Followed by  . [START ON 07/31/2013] chlordiazePOXIDE (LIBRIUM) capsule 25 mg  25 mg Oral Daily Laverle Hobby, PA-C      . diclofenac (VOLTAREN) EC tablet 50 mg  50 mg Oral TID PRN Laverle Hobby, PA-C      . gabapentin (NEURONTIN) capsule 300 mg  300 mg Oral BID Laverle Hobby, PA-C   300 mg at 07/27/13 0751  . hydrOXYzine (ATARAX/VISTARIL) tablet 25 mg  25 mg Oral Q6H PRN Laverle Hobby, PA-C      . lisinopril (PRINIVIL,ZESTRIL) tablet 10 mg  10 mg Oral Daily Laverle Hobby, PA-C   10 mg at 07/27/13 0749  . loperamide (IMODIUM) capsule 2-4 mg  2-4 mg Oral PRN Laverle Hobby, PA-C      . magnesium hydroxide (MILK OF MAGNESIA) suspension 30 mL  30  mL Oral Daily PRN Laverle Hobby, PA-C      . multivitamin with minerals tablet 1 tablet  1 tablet Oral Daily Laverle Hobby, PA-C   1 tablet at 07/27/13 0749  . nicotine (NICODERM CQ - dosed in mg/24 hours) patch 14 mg  14 mg Transdermal Daily Laverle Hobby, PA-C      . ondansetron (ZOFRAN-ODT) disintegrating tablet 4 mg  4 mg Oral Q6H PRN Laverle Hobby, PA-C      . risperiDONE (RISPERDAL) tablet 2 mg  2 mg Oral QHS Laverle Hobby, PA-C   2 mg at 07/26/13 2329  . thiamine (B-1) injection 100 mg  100 mg Intramuscular Once [employer], PA-C      .  thiamine (VITAMIN B-1) tablet 100 mg  100 mg Oral Daily Laverle Hobby, PA-C   100 mg at 07/27/13 0749  . traZODone (DESYREL) tablet 50 mg  50 mg Oral QHS,MR X 1 Laverle Hobby, PA-C   50 mg at 07/26/13 2330    Suicide Risk Assessment     Nursing information obtained from:  Patient Demographic factors:  Male;Caucasian;Low socioeconomic status;Unemployed Current Mental Status:  Self-harm thoughts Loss Factors:  Decline in physical health;Financial problems / change in socioeconomic status Historical Factors:  Family history of mental illness or substance abuse Risk Reduction Factors:  Living with another person, especially a relative Total Time spent with patient: 40 minutes  CLINICAL FACTORS:   Depression:   Anhedonia Comorbid alcohol abuse/dependence Hopelessness Impulsivity Insomnia Alcohol/Substance Abuse/Dependencies  Psychiatric Specialty Exam:     Blood pressure 129/87, pulse 102, temperature 97.8 F (36.6 C), temperature source Oral, resp. rate 18, height _0  (1.727 m), weight 97.523 kg (215 lb), SpO2 100.00%.Body mass index is 32.7 kg/(m^2).  SEE PSE ABOVE  COGNITIVE FEATURES THAT CONTRIBUTE TO RISK:  Closed-mindedness    SUICIDE RISK:   Minimal: No identifiable suicidal ideation.  Patients presenting with no risk factors but with morbid ruminations; may be classified as minimal risk based on the severity of the  depressive symptoms  PLAN OF CARE: -Discharge home with outpatient resources for followup for alcohol detox treatment  Benjamine Mola, FNP-BC 07/27/2013, 5:30 PM

## 2013-07-27 NOTE — BH Assessment (Signed)
[facility] Assessment Progress Note  After consulting with Blake Pizza, NP it has been determined that psychiatric hospitalization is not indicated for him at this time.  He is to be referred to his current provider.  I then spoke with the pt.  He reports that he receives outpatient treatment through [facility], and that he is scheduled for an appointment tomorrow, Wednesday, 07/28/2013 at 11:15 am.  Pt is advised to follow up with this appointment.  Blake Mullet, MA Triage Specialist 07/27/2013 @ 15:06

## 2013-07-27 NOTE — Progress Notes (Signed)
Patient ID: Blake Arnold, male   DOB: [birth year], 43 y.o.   MRN: [id number] Pt admitted to Observation Unit requesting help with alcohol detoxification. Pt alert and oriented. Denies SI/HI, -A/V hall. Verbally contracts for safety.  Will continue to monitor and evaluate for stabilization.        [name]

## 2013-07-27 NOTE — Discharge Summary (Signed)
Patient seen, evaluated by me. Patient is stable and can be discharged for outpatient services.

## 2013-07-27 NOTE — Discharge Instructions (Signed)
For your ongoing behavioral health needs you are advised to keep your previously scheduled appointment at [facility] on Wednesday, 12/28/2013 at 11:15 am.       [facility]      [address], [place] [address]      [phone]

## 2013-07-27 NOTE — H&P (Signed)
Patient seen, evaluated by me. Patient denies any suicidal ideation, homicidal ideation, needs detox.

## 2013-07-27 NOTE — H&P (Signed)
[facility] OBS UNIT H&P   Patient Identification:  Blake Arnold Date of Evaluation:  07/27/2013 Chief Complaint:  Alcohol use disorder severe  Subjective: Pt seen and chart reviewed. Pt reports that he feels much better now and is denying all detox/withdrawal symptoms. Pt also denies SI, HI, and AVH, contracts for safety. Pt will be seeking outpatient treatment.   History of Present Illness:: Blake Arnold is an 43 y.o. male. Pt present to the emergency room for chest pains and request for detox. Pt reports he had been sober for two months, but began drinking five days ago. Pt indicates he has been drinking a case of beer a day since his relapse. Pt denies known trigger for this relapse, but notes contact with his ex-girlfriend has been a trigger in the past. Pt reports he also took cocaine 4 days ago. Pt reports alcohol and substance use run in his family, and that there is drinking in his home. Pt reports he does not feel he can achieve initial sobriety in his home. Pt is angry and despondent about his relapse. Pt denies current SI/SA. Pt denies HI/HA. Pt reports in the past he thought he could drink himself to death due to having Hep C. He worries his drinking will have fatal consequences. Pt currently sees providers at [facility] for OP counseling for depression, etoh, and medication management. Pt reports he has not been taking his Risperdal as prescribed. Pt reports weight gain and increase appetite with medication, and last time taking it was 5 days ago, prior to starting drinking again. Pt also missed his OP session this week due to returning to drinking.  Pt reports he has history of Major Depression, and began to hear voices when depressed about 1.5 years ago. Pt indicated he hears "millions of voices" but can not decipher what they are saying. He reports this is scary and irritating to him. Pt reports the only time he does not feel depressed is when he is drinking. He reports  sx of tearfulness, hopelessness, increased irritability, being quick to anger, and being disappointed with himself.   Total Time spent with patient: 40 minutes  Psychiatric Specialty Exam: Physical Exam  Review of Systems  Constitutional: Negative.   HENT: Negative.   Eyes: Negative.   Respiratory: Negative.   Cardiovascular: Negative.   Gastrointestinal: Negative.   Genitourinary: Negative.   Musculoskeletal: Negative.   Skin: Negative.   Neurological: Negative.   Endo/Heme/Allergies: Negative.   Psychiatric/Behavioral: Positive for depression. The patient is nervous/anxious.     Blood pressure 129/87, pulse 102, temperature 97.8 F (36.6 C), temperature source Oral, resp. rate 18, height 5' 8"  (1.727 m), weight 97.523 kg (215 lb), SpO2 100.00%.Body mass index is 32.7 kg/(m^2).  General Appearance: Casual  Eye Contact::  Good  Speech:  Clear and Coherent  Volume:  Normal  Mood:  Euthymic  Affect:  Appropriate and Congruent  Thought Process:  Coherent and Goal Directed  Orientation:  Full (Time, Place, and Person)  Thought Content:  WDL  Suicidal Thoughts:  No  Homicidal Thoughts:  No  Memory:  Immediate;   Fair Recent;   Fair Remote;   Fair  Judgement:  Fair  Insight:  Fair  Psychomotor Activity:  Normal  Concentration:  Good  Recall:  [place] of Knowledge:Fair  Language: Good  Akathisia:  No  Handed:    AIMS (if indicated):     Assets:  Communication Skills Desire for Improvement Resilience  Sleep:       Musculoskeletal: Strength & Muscle Tone: within normal limits Gait & Station: normal Patient leans: N/A   Past Medical History:   Past Medical History  Diagnosis Date  . Hepatitis C   . Chronic back pain   . A-fib   . Depression   . Anxiety   . Hepatitis C     history of  . Chronic back pain   . Acid reflux   . History of urinary frequency   . Peripheral neuropathy     hands and feet  . Hypertension   . Polysubstance abuse 01/25/2011  .  Alcohol abuse   . Seizures    None. Allergies:  No Known Allergies PTA Medications: Prescriptions prior to admission  Medication Sig Dispense Refill  . acamprosate (CAMPRAL) 333 MG tablet Take 2 tablets (666 mg total) by mouth 3 (three) times daily with meals. For alcohol dependence  90 tablet  0  . diclofenac (VOLTAREN) 50 MG EC tablet Take 50 mg by mouth 3 (three) times daily as needed (joint pain/ inflammation).      . gabapentin (NEURONTIN) 300 MG capsule Take 1 capsule (300 mg total) by mouth 2 (two) times daily. For substance withdrawal syndrome  60 capsule  0  . lisinopril (PRINIVIL,ZESTRIL) 10 MG tablet Take 10 mg by mouth daily.      . risperiDONE (RISPERDAL) 2 MG tablet Take 1 tablet (2 mg total) by mouth at bedtime. For mood control  30 tablet  0    Previous Psychotropic Medications:  Medication/Dose                 Substance Abuse History in the last 12 months:  yes  Consequences of Substance Abuse: Medical Consequences:  Hospitalization  Social History:  reports that he has been smoking Cigarettes.  He has a 50 pack-year smoking history. He quit smokeless tobacco use about 9 months ago. He reports that he drinks alcohol. He reports that he uses illicit drugs ("Crack" cocaine and Cocaine). Additional Social History: Pain Medications: denies abuse Prescriptions: denies abuse Over the Counter: denies abuse History of alcohol / drug use?: Yes Longest period of sobriety (when/how long): 1 1/2 in 2012 Negative Consequences of Use: Financial;Personal relationships;Work / School Withdrawal Symptoms: Tremors;Irritability;Seizures;Cramps;Sweats;Diarrhea;Nausea / Vomiting Onset of Seizures: pt reports seizure 8 months when tried to stop drinking Date of most recent seizure: pt reports seizure 8 months when tried to stop drinking Name of Substance 1: alcohol 1 - Age of First Use: 43 yrs old 1 - Amount (size/oz): up to 24-30 beers daily or several bottles of wine  1 -  Frequency: relapsed last thursday 07/22/13 was sober since May 30, 2013 1 - Duration: 41month 1 - Last Use / Amount: 07/25/13 @4pm  Name of Substance 2: cocaine 2 - Age of First Use: 17 2 - Amount (size/oz): "1/4 gram" 2 - Frequency: "few times a year" 2 - Last Use / Amount: 07/22/13                 Family History:  History reviewed. No pertinent family history.  Results for orders placed during the hospital encounter of 07/25/13 (from the past 72 hour(s))  CBC     Status: None   Collection Time    07/25/13  9:56 PM      Result Value Ref Range   WBC 8.5  4.0 - 10.5 K/uL   RBC 4.91  4.22 - 5.81 MIL/uL   Hemoglobin 16.5  13.0 - 17.0 g/dL   HCT 46.3  39.0 - 52.0 %   MCV 94.3  78.0 - 100.0 fL   MCH 33.6  26.0 - 34.0 pg   MCHC 35.6  30.0 - 36.0 g/dL   RDW 12.5  11.5 - 15.5 %   Platelets 260  150 - 400 K/uL  PROTIME-INR     Status: None   Collection Time    07/25/13  9:56 PM      Result Value Ref Range   Prothrombin Time 12.8  11.6 - 15.2 seconds   INR 0.98  0.00 - 1.49  COMPREHENSIVE METABOLIC PANEL     Status: Abnormal   Collection Time    07/25/13  9:56 PM      Result Value Ref Range   Sodium 132 (*) 137 - 147 mEq/L   Potassium 4.2  3.7 - 5.3 mEq/L   Chloride 91 (*) 96 - 112 mEq/L   CO2 23  19 - 32 mEq/L   Glucose, Bld 104 (*) 70 - 99 mg/dL   BUN 14  6 - 23 mg/dL   Creatinine, Ser 1.12  0.50 - 1.35 mg/dL   Calcium 8.8  8.4 - 10.5 mg/dL   Total Protein 7.6  6.0 - 8.3 g/dL   Albumin 3.5  3.5 - 5.2 g/dL   AST 52 (*) 0 - 37 U/L   ALT 51  0 - 53 U/L   Alkaline Phosphatase 66  39 - 117 U/L   Total Bilirubin 0.3  0.3 - 1.2 mg/dL   GFR calc non Af Amer 78 (*) >90 mL/min   GFR calc Af Amer >90  >90 mL/min   Comment: (NOTE)     The eGFR has been calculated using the CKD EPI equation.     This calculation has not been validated in all clinical situations.     eGFR's persistently <90 mL/min signify possible Chronic Kidney     Disease.  SALICYLATE LEVEL     Status:  Abnormal   Collection Time    07/25/13  9:56 PM      Result Value Ref Range   Salicylate Lvl <1.6 (*) 2.8 - 20.0 mg/dL  ACETAMINOPHEN LEVEL     Status: None   Collection Time    07/25/13  9:56 PM      Result Value Ref Range   Acetaminophen (Tylenol), Serum <15.0  10 - 30 ug/mL   Comment:            THERAPEUTIC CONCENTRATIONS VARY     SIGNIFICANTLY. A RANGE OF 10-30     ug/mL MAY BE AN EFFECTIVE     CONCENTRATION FOR MANY PATIENTS.     HOWEVER, SOME ARE BEST TREATED     AT CONCENTRATIONS OUTSIDE THIS     RANGE.     ACETAMINOPHEN CONCENTRATIONS     >150 ug/mL AT 4 HOURS AFTER     INGESTION AND >50 ug/mL AT 12     HOURS AFTER INGESTION ARE     OFTEN ASSOCIATED WITH TOXIC     REACTIONS.  ETHANOL     Status: Abnormal   Collection Time    07/25/13  9:56 PM      Result Value Ref Range   Alcohol, Ethyl (B) 164 (*) 0 - 11 mg/dL   Comment:            LOWEST DETECTABLE LIMIT FOR     SERUM ALCOHOL IS 11 mg/dL     FOR MEDICAL PURPOSES  ONLY  I-STAT TROPOININ, ED     Status: None   Collection Time    07/25/13 10:02 PM      Result Value Ref Range   Troponin i, poc 0.00  0.00 - 0.08 ng/mL   Comment 3            Comment: Due to the release kinetics of cTnI,     a negative result within the first hours     of the onset of symptoms does not rule out     myocardial infarction with certainty.     If myocardial infarction is still suspected,     repeat the test at appropriate intervals.  TROPONIN I     Status: None   Collection Time    07/26/13 12:20 AM      Result Value Ref Range   Troponin I <0.30  <0.30 ng/mL   Comment:            Due to the release kinetics of cTnI,     a negative result within the first hours     of the onset of symptoms does not rule out     myocardial infarction with certainty.     If myocardial infarction is still suspected,     repeat the test at appropriate intervals.  URINE RAPID DRUG SCREEN (HOSP PERFORMED)     Status: None   Collection Time     07/26/13 12:27 AM      Result Value Ref Range   Opiates NONE DETECTED  NONE DETECTED   Cocaine NONE DETECTED  NONE DETECTED   Benzodiazepines NONE DETECTED  NONE DETECTED   Amphetamines NONE DETECTED  NONE DETECTED   Tetrahydrocannabinol NONE DETECTED  NONE DETECTED   Barbiturates NONE DETECTED  NONE DETECTED   Comment:            DRUG SCREEN FOR MEDICAL PURPOSES     ONLY.  IF CONFIRMATION IS NEEDED     FOR ANY PURPOSE, NOTIFY LAB     WITHIN 5 DAYS.                LOWEST DETECTABLE LIMITS     FOR URINE DRUG SCREEN     Drug Class       Cutoff (ng/mL)     Amphetamine      1000     Barbiturate      200     Benzodiazepine   785     Tricyclics       885     Opiates          300     Cocaine          300     THC              50   Psychological Evaluations:  Assessment:   DSM5:  Substance/Addictive Disorders:  Alcohol Related Disorder - Severe (303.90) Depressive Disorders:  Major Depressive Disorder - Severe (296.23)  AXIS I:  Alcohol Abuse, Major Depression, Recurrent severe and Substance Induced Mood Disorder AXIS II:  Deferred AXIS III:   Past Medical History  Diagnosis Date  . Hepatitis C   . Chronic back pain   . A-fib   . Depression   . Anxiety   . Hepatitis C     history of  . Chronic back pain   . Acid reflux   . History of urinary frequency   . Peripheral neuropathy     hands and  feet  . Hypertension   . Polysubstance abuse 01/25/2011  . Alcohol abuse   . Seizures    AXIS IV:  other psychosocial or environmental problems and problems related to social environment AXIS V:  61-70 mild symptoms  Treatment Plan/Recommendations:   Review of chart, vital signs, medications, and notes.  1-Medication management for depression and anxiety: Medications reviewed with the patient and she stated no untoward effects, unchanged. 2-Coping skills for depression, anxiety  3-Continue crisis stabilization and management  4-Address health issues--monitoring vital signs,  stable  5-Treatment plan in progress to prevent relapse of depression and anxiety  Treatment Plan Summary: Daily contact with patient to assess and evaluate symptoms and progress in treatment Medication management Current Medications:  Current Facility-Administered Medications  Medication Dose Route Frequency Provider Last Rate Last Dose  . acamprosate (CAMPRAL) tablet 666 mg  666 mg Oral TID WC Laverle Hobby, PA-C   666 mg at 07/27/13 2919  . acetaminophen (TYLENOL) tablet 650 mg  650 mg Oral Q6H PRN Laverle Hobby, PA-C      . alum & mag hydroxide-simeth (MAALOX/MYLANTA) 200-200-20 MG/5ML suspension 30 mL  30 mL Oral Q4H PRN Laverle Hobby, PA-C      . chlordiazePOXIDE (LIBRIUM) capsule 25 mg  25 mg Oral Q6H PRN Laverle Hobby, PA-C      . chlordiazePOXIDE (LIBRIUM) capsule 25 mg  25 mg Oral QID Laverle Hobby, PA-C   25 mg at 07/27/13 0749   Followed by  . [START ON 07/28/2013] chlordiazePOXIDE (LIBRIUM) capsule 25 mg  25 mg Oral TID Laverle Hobby, PA-C       Followed by  . [START ON 07/29/2013] chlordiazePOXIDE (LIBRIUM) capsule 25 mg  25 mg Oral BH-qamhs Spencer E Simon, PA-C       Followed by  . [START ON 07/31/2013] chlordiazePOXIDE (LIBRIUM) capsule 25 mg  25 mg Oral Daily Laverle Hobby, PA-C      . diclofenac (VOLTAREN) EC tablet 50 mg  50 mg Oral TID PRN Laverle Hobby, PA-C      . gabapentin (NEURONTIN) capsule 300 mg  300 mg Oral BID Laverle Hobby, PA-C   300 mg at 07/27/13 0751  . hydrOXYzine (ATARAX/VISTARIL) tablet 25 mg  25 mg Oral Q6H PRN Laverle Hobby, PA-C      . lisinopril (PRINIVIL,ZESTRIL) tablet 10 mg  10 mg Oral Daily Laverle Hobby, PA-C   10 mg at 07/27/13 0749  . loperamide (IMODIUM) capsule 2-4 mg  2-4 mg Oral PRN Laverle Hobby, PA-C      . magnesium hydroxide (MILK OF MAGNESIA) suspension 30 mL  30 mL Oral Daily PRN Laverle Hobby, PA-C      . multivitamin with minerals tablet 1 tablet  1 tablet Oral Daily Laverle Hobby, PA-C   1 tablet at  07/27/13 0749  . nicotine (NICODERM CQ - dosed in mg/24 hours) patch 14 mg  14 mg Transdermal Daily Laverle Hobby, PA-C      . ondansetron (ZOFRAN-ODT) disintegrating tablet 4 mg  4 mg Oral Q6H PRN Laverle Hobby, PA-C      . risperiDONE (RISPERDAL) tablet 2 mg  2 mg Oral QHS Laverle Hobby, PA-C   2 mg at 07/26/13 2329  . thiamine (B-1) injection 100 mg  100 mg Intramuscular Once [employer], PA-C      . thiamine (VITAMIN B-1) tablet 100 mg  100 mg Oral Daily Laverle Hobby,  PA-C   100 mg at 07/27/13 0749  . traZODone (DESYREL) tablet 50 mg  50 mg Oral QHS,MR X 1 Laverle Hobby, PA-C   50 mg at 07/26/13 2330   Benjamine Mola, FNP-BC 6/16/20158:42 AM

## 2013-08-09 ENCOUNTER — Emergency Department (HOSPITAL_COMMUNITY)
Admission: EM | Admit: 2013-08-09 | Discharge: 2013-08-09 | Disposition: A | Discharge status: Home / Self Care | Payer: Self-pay | Attending: Emergency Medicine | Admitting: Emergency Medicine

## 2013-08-09 ENCOUNTER — Encounter (HOSPITAL_COMMUNITY): Payer: Self-pay | Admitting: Emergency Medicine

## 2013-08-09 DIAGNOSIS — G8929 Other chronic pain: Secondary | ICD-10-CM | POA: Insufficient documentation

## 2013-08-09 DIAGNOSIS — F329 Major depressive disorder, single episode, unspecified: Secondary | ICD-10-CM | POA: Insufficient documentation

## 2013-08-09 DIAGNOSIS — Y939 Activity, unspecified: Secondary | ICD-10-CM | POA: Insufficient documentation

## 2013-08-09 DIAGNOSIS — S40269A Insect bite (nonvenomous) of unspecified shoulder, initial encounter: Principal | ICD-10-CM

## 2013-08-09 DIAGNOSIS — F3289 Other specified depressive episodes: Secondary | ICD-10-CM | POA: Insufficient documentation

## 2013-08-09 DIAGNOSIS — L089 Local infection of the skin and subcutaneous tissue, unspecified: Secondary | ICD-10-CM | POA: Insufficient documentation

## 2013-08-09 DIAGNOSIS — W57XXXA Bitten or stung by nonvenomous insect and other nonvenomous arthropods, initial encounter: Principal | ICD-10-CM

## 2013-08-09 DIAGNOSIS — Z8719 Personal history of other diseases of the digestive system: Secondary | ICD-10-CM | POA: Insufficient documentation

## 2013-08-09 DIAGNOSIS — G40909 Epilepsy, unspecified, not intractable, without status epilepticus: Secondary | ICD-10-CM | POA: Insufficient documentation

## 2013-08-09 DIAGNOSIS — L03113 Cellulitis of right upper limb: Secondary | ICD-10-CM

## 2013-08-09 DIAGNOSIS — I10 Essential (primary) hypertension: Secondary | ICD-10-CM | POA: Insufficient documentation

## 2013-08-09 DIAGNOSIS — Z23 Encounter for immunization: Secondary | ICD-10-CM | POA: Insufficient documentation

## 2013-08-09 DIAGNOSIS — Y929 Unspecified place or not applicable: Secondary | ICD-10-CM | POA: Insufficient documentation

## 2013-08-09 DIAGNOSIS — F411 Generalized anxiety disorder: Secondary | ICD-10-CM | POA: Insufficient documentation

## 2013-08-09 DIAGNOSIS — IMO0002 Reserved for concepts with insufficient information to code with codable children: Secondary | ICD-10-CM | POA: Insufficient documentation

## 2013-08-09 DIAGNOSIS — Z8619 Personal history of other infectious and parasitic diseases: Secondary | ICD-10-CM | POA: Insufficient documentation

## 2013-08-09 DIAGNOSIS — Z8679 Personal history of other diseases of the circulatory system: Secondary | ICD-10-CM | POA: Insufficient documentation

## 2013-08-09 DIAGNOSIS — Z79899 Other long term (current) drug therapy: Secondary | ICD-10-CM | POA: Insufficient documentation

## 2013-08-09 DIAGNOSIS — F17200 Nicotine dependence, unspecified, uncomplicated: Secondary | ICD-10-CM | POA: Insufficient documentation

## 2013-08-09 DIAGNOSIS — G609 Hereditary and idiopathic neuropathy, unspecified: Secondary | ICD-10-CM | POA: Insufficient documentation

## 2013-08-09 LAB — CBC WITH DIFFERENTIAL/PLATELET
Basophils Absolute: 0 10*3/uL (ref 0.0–0.1)
Basophils Relative: 0 % (ref 0–1)
Eosinophils Absolute: 0.1 10*3/uL (ref 0.0–0.7)
Eosinophils Relative: 1 % (ref 0–5)
HCT: 43.8 % (ref 39.0–52.0)
Hemoglobin: 14.9 g/dL (ref 13.0–17.0)
Lymphocytes Relative: 14 % (ref 12–46)
Lymphs Abs: 1.2 10*3/uL (ref 0.7–4.0)
MCH: 32.8 pg (ref 26.0–34.0)
MCHC: 34 g/dL (ref 30.0–36.0)
MCV: 96.5 fL (ref 78.0–100.0)
Monocytes Absolute: 0.6 10*3/uL (ref 0.1–1.0)
Monocytes Relative: 7 % (ref 3–12)
Neutro Abs: 6.3 10*3/uL (ref 1.7–7.7)
Neutrophils Relative %: 78 % — ABNORMAL HIGH (ref 43–77)
Platelets: 175 10*3/uL (ref 150–400)
RBC: 4.54 MIL/uL (ref 4.22–5.81)
RDW: 12.4 % (ref 11.5–15.5)
WBC: 8.1 10*3/uL (ref 4.0–10.5)

## 2013-08-09 LAB — BASIC METABOLIC PANEL
BUN: 3 mg/dL — ABNORMAL LOW (ref 6–23)
CO2: 24 mEq/L (ref 19–32)
Calcium: 8.5 mg/dL (ref 8.4–10.5)
Chloride: 100 mEq/L (ref 96–112)
Creatinine, Ser: 0.88 mg/dL (ref 0.50–1.35)
GFR calc Af Amer: >90 mL/min (ref >90)
GFR calc non Af Amer: >90 mL/min (ref >90)
Glucose, Bld: 141 mg/dL — ABNORMAL HIGH (ref 70–99)
Potassium: 4 mEq/L (ref 3.7–5.3)
Sodium: 138 mEq/L (ref 137–147)

## 2013-08-09 MED ORDER — TETANUS-DIPHTH-ACELL PERTUSSIS 5-2.5-18.5 LF-MCG/0.5 IM SUSP
0.5000 mL | Freq: Once | INTRAMUSCULAR | Status: AC
  Administered 2013-08-09: 0.5 mL via INTRAMUSCULAR
  Filled 2013-08-09: qty 0.5

## 2013-08-09 MED ORDER — CLINDAMYCIN HCL 150 MG PO CAPS: ORAL_CAPSULE | ORAL | Status: DC

## 2013-08-09 MED ORDER — CLINDAMYCIN PHOSPHATE 900 MG/50ML IV SOLN
900.0000 mg | Freq: Once | INTRAVENOUS | Status: AC
  Administered 2013-08-09: 900 mg via INTRAVENOUS
  Filled 2013-08-09: qty 50

## 2013-08-09 NOTE — ED Notes (Signed)
Pt reports possibly bit by some insect about 3 days ago. Pt reports area started out as a small bump. Pt presents with large red raised area with open center. No drainage noted.

## 2013-08-09 NOTE — Discharge Instructions (Signed)
Return here in 2 days for a recheck of the area or sooner if the area worsens.  Keep the area clean and dry.  Use heat around the area

## 2013-08-09 NOTE — Discharge Planning (Signed)
[place] to patient about primary care resources and establishing care with a provider. Patient states he is currently being seen at [facility]. Patient was given the orange card application to take to his pcp. Resource guide and my contact information also provided for any future questions or concerns. No other needs expressed at this time.

## 2013-08-09 NOTE — ED Provider Notes (Signed)
CSN: [id number]     Arrival date & time 08/09/13  9242 History   First MD Initiated Contact with Patient 08/09/13 1002     Chief Complaint  Patient presents with  . Rash  . Insect Bite     (Consider location/radiation/quality/duration/timing/severity/associated sxs/prior Treatment) HPI Patient presents to the emergency department with swelling and redness to his posterior upper right arm.  Patient, states, that he noticed this area 3 days ago.  Patient, states their was a small white bump in the middle of the swollen, red area.  Patient, states, that she's not had any fevers, nausea, vomiting, weakness, dizziness, headache, blurred vision, chest pain, shortness of breath, or syncope.  Patient, states, that palpation makes the pain, worse.  Patient, states the red area has increased in size since he first noticed the area Past Medical History  Diagnosis Date  . Hepatitis C   . Chronic back pain   . A-fib   . Depression   . Anxiety   . Hepatitis C     history of  . Chronic back pain   . Acid reflux   . History of urinary frequency   . Peripheral neuropathy     hands and feet  . Hypertension   . Polysubstance abuse 01/25/2011  . Alcohol abuse   . Seizures    Past Surgical History  Procedure Laterality Date  . Cardioversion  03/07/2006   No family history on file. History  Substance Use Topics  . Smoking status: Current Every Day Smoker -- 2.00 packs/day for 25 years    Types: Cigarettes  . Smokeless tobacco: Former Systems developer    Quit date: 10/24/2012  . Alcohol Use: 0.0 oz/week     Comment: 1/5 Vodka; 1-40oz Daily     Review of Systems  All other systems negative except as documented in the HPI. All pertinent positives and negatives as reviewed in the HPI.  Allergies  Review of patient's allergies indicates no known allergies.  Home Medications   Prior to Admission medications   Medication Sig Start Date End Date Taking? Authorizing Provider  acamprosate (CAMPRAL) 333  MG tablet Take 2 tablets (666 mg total) by mouth 3 (three) times daily with meals. For alcohol dependence 07/27/13  Yes Benjamine Mola, FNP  diclofenac (VOLTAREN) 50 MG EC tablet Take 1 tablet (50 mg total) by mouth 3 (three) times daily as needed (joint pain/ inflammation). 07/27/13  Yes Benjamine Mola, FNP  gabapentin (NEURONTIN) 300 MG capsule Take 1 capsule (300 mg total) by mouth 2 (two) times daily. For substance withdrawal syndrome 02/22/13  Yes Encarnacion Slates, NP  lisinopril (PRINIVIL,ZESTRIL) 10 MG tablet Take 1 tablet (10 mg total) by mouth daily. 07/27/13  Yes Benjamine Mola, FNP  risperiDONE (RISPERDAL) 2 MG tablet Take 1 tablet (2 mg total) by mouth at bedtime. For mood control 07/27/13  Yes Benjamine Mola, FNP   BP 118/76  Pulse 73  Temp(Src) 98 F (36.7 C) (Oral)  Resp 16  Wt 212 lb (96.163 kg)  SpO2 98% Physical Exam  Nursing note and vitals reviewed. Constitutional: He is oriented to person, place, and time. He appears well-developed and well-nourished. No distress.  HENT:  Head: Normocephalic and atraumatic.  Mouth/Throat: Oropharynx is clear and moist.  Eyes: Pupils are equal, round, and reactive to light.  Cardiovascular: Normal rate, regular rhythm and normal heart sounds.  Exam reveals no gallop and no friction rub.   No murmur heard. Pulmonary/Chest: Effort normal and  breath sounds normal. No respiratory distress.  Neurological: He is alert and oriented to person, place, and time.  Skin: Skin is warm and dry. There is erythema.       ED Course  Procedures (including critical care time) Labs Review Labs Reviewed  BASIC METABOLIC PANEL - Abnormal; Notable for the following:    Glucose, Bld 141 (*)    BUN 3 (*)    All other components within normal limits  CBC WITH DIFFERENTIAL - Abnormal; Notable for the following:    Neutrophils Relative % 78 (*)    All other components within normal limits        Patient was given IV clindamycin and have advised him to  return here in 2 days for recheck.  Patient's best return here if the area worsens in the interim.  He is advised to keep area clean and dry.  Also advised the use heat on the area.  Advised the patient.  This could represent some sort of bite.  Will need to monitor further      Brent General, PA-C 08/09/13 1347

## 2013-08-10 NOTE — ED Provider Notes (Signed)
Medical screening examination/treatment/procedure(s) were conducted as a shared visit with non-physician practitioner(s) and myself.  I personally evaluated the patient during the encounter.  Pt c/o ?recent insect bite (pt did not see any insect) to right upper arm. Now w spreading redness from wound.  Pt w area right upper arm c/w prior insect or spider bite. No necrotic tissue, no fluctuance. Surrounding warmth/erythema c/w cellulitis.   Tetanus updated. Po abx. Recheck in 2 days.   Mirna Mires, MD 08/10/13 [phone]

## 2013-08-11 ENCOUNTER — Emergency Department (HOSPITAL_COMMUNITY)
Admission: EM | Admit: 2013-08-11 | Discharge: 2013-08-11 | Disposition: A | Discharge status: Home / Self Care | Payer: Self-pay | Attending: Emergency Medicine | Admitting: Emergency Medicine

## 2013-08-11 ENCOUNTER — Encounter (HOSPITAL_COMMUNITY): Payer: Self-pay | Admitting: Emergency Medicine

## 2013-08-11 DIAGNOSIS — Z8719 Personal history of other diseases of the digestive system: Secondary | ICD-10-CM | POA: Insufficient documentation

## 2013-08-11 DIAGNOSIS — Z8619 Personal history of other infectious and parasitic diseases: Secondary | ICD-10-CM | POA: Insufficient documentation

## 2013-08-11 DIAGNOSIS — F3289 Other specified depressive episodes: Secondary | ICD-10-CM | POA: Insufficient documentation

## 2013-08-11 DIAGNOSIS — R197 Diarrhea, unspecified: Secondary | ICD-10-CM | POA: Insufficient documentation

## 2013-08-11 DIAGNOSIS — Z79899 Other long term (current) drug therapy: Secondary | ICD-10-CM | POA: Insufficient documentation

## 2013-08-11 DIAGNOSIS — G8929 Other chronic pain: Secondary | ICD-10-CM | POA: Insufficient documentation

## 2013-08-11 DIAGNOSIS — F17200 Nicotine dependence, unspecified, uncomplicated: Secondary | ICD-10-CM | POA: Insufficient documentation

## 2013-08-11 DIAGNOSIS — Z87448 Personal history of other diseases of urinary system: Secondary | ICD-10-CM | POA: Insufficient documentation

## 2013-08-11 DIAGNOSIS — Z5189 Encounter for other specified aftercare: Secondary | ICD-10-CM

## 2013-08-11 DIAGNOSIS — F329 Major depressive disorder, single episode, unspecified: Secondary | ICD-10-CM | POA: Insufficient documentation

## 2013-08-11 DIAGNOSIS — I10 Essential (primary) hypertension: Secondary | ICD-10-CM | POA: Insufficient documentation

## 2013-08-11 DIAGNOSIS — Z791 Long term (current) use of non-steroidal anti-inflammatories (NSAID): Secondary | ICD-10-CM | POA: Insufficient documentation

## 2013-08-11 DIAGNOSIS — Z4801 Encounter for change or removal of surgical wound dressing: Secondary | ICD-10-CM | POA: Insufficient documentation

## 2013-08-11 DIAGNOSIS — R112 Nausea with vomiting, unspecified: Secondary | ICD-10-CM | POA: Insufficient documentation

## 2013-08-11 DIAGNOSIS — G40909 Epilepsy, unspecified, not intractable, without status epilepticus: Secondary | ICD-10-CM | POA: Insufficient documentation

## 2013-08-11 DIAGNOSIS — F411 Generalized anxiety disorder: Secondary | ICD-10-CM | POA: Insufficient documentation

## 2013-08-11 MED ORDER — ONDANSETRON HCL 4 MG PO TABS: 4.0000 mg | ORAL_TABLET | Freq: Four times a day (QID) | ORAL | Status: DC

## 2013-08-11 NOTE — ED Notes (Signed)
Pt here for wound recheck to right upper arm; redness and swelling decreased; large wound area noted

## 2013-08-11 NOTE — ED Provider Notes (Signed)
CSN: [id number]     Arrival date & time 08/11/13  1038 History  This chart was scribed for non-physician practitioner, Harvie Heck, PA-C working with Richarda Blade, MD by Frederich Balding, ED scribe. This patient was seen in room TR10C/TR10C and the patient's care was started at 11:34 AM.   Chief Complaint  Patient presents with  . Wound Check   HPI Comments: Blake Arnold is a 43 y.o. male who presents to the Emergency Department for wound check. Pt was evaluated here 2 days ago for an area of redness and swelling to his right upper arm that started 4 days prior to that. Pt thinks he was bitten by a brown recluse spider. He was given IV clindamycin, discharged home with the same and told to return for recheck. Pt has been taking the antibiotic as prescribed. The redness and swelling have decreased. States it has started draining since he was initially evaluated. He has put peroxide over it to clean it. Pt has diarrhea, nausea and vomiting that started prior to Clindamycin.   PCP: Elbert Ewings, FNP  The history is provided by the patient. No language interpreter was used.    Past Medical History  Diagnosis Date  . Hepatitis C   . Chronic back pain   . A-fib   . Depression   . Anxiety   . Hepatitis C     history of  . Chronic back pain   . Acid reflux   . History of urinary frequency   . Peripheral neuropathy     hands and feet  . Hypertension   . Polysubstance abuse 01/25/2011  . Alcohol abuse   . Seizures    Past Surgical History  Procedure Laterality Date  . Cardioversion  03/07/2006   History reviewed. No pertinent family history. History  Substance Use Topics  . Smoking status: Current Every Day Smoker -- 2.00 packs/day for 25 years    Types: Cigarettes  . Smokeless tobacco: Former Systems developer    Quit date: 10/24/2012  . Alcohol Use: 0.0 oz/week     Comment: 1/5 Vodka; 1-40oz Daily     Review of Systems  Constitutional: Negative for fever and chills.   Gastrointestinal: Positive for nausea, vomiting and diarrhea.  Skin: Positive for color change and wound.  All other systems reviewed and are negative.  Allergies  Review of patient's allergies indicates no known allergies.  Home Medications   Prior to Admission medications   Medication Sig Start Date End Date Taking? Authorizing Provider  acamprosate (CAMPRAL) 333 MG tablet Take 2 tablets (666 mg total) by mouth 3 (three) times daily with meals. For alcohol dependence 07/27/13  Yes Benjamine Mola, FNP  clindamycin (CLEOCIN) 150 MG capsule 3 PO TID for 10 days 08/09/13  Yes Resa Miner Lawyer, PA-C  diclofenac (VOLTAREN) 50 MG EC tablet Take 1 tablet (50 mg total) by mouth 3 (three) times daily as needed (joint pain/ inflammation). 07/27/13  Yes Benjamine Mola, FNP  gabapentin (NEURONTIN) 300 MG capsule Take 1 capsule (300 mg total) by mouth 2 (two) times daily. For substance withdrawal syndrome 02/22/13  Yes Encarnacion Slates, NP  lisinopril (PRINIVIL,ZESTRIL) 10 MG tablet Take 1 tablet (10 mg total) by mouth daily. 07/27/13  Yes Benjamine Mola, FNP  risperiDONE (RISPERDAL) 2 MG tablet Take 1 tablet (2 mg total) by mouth at bedtime. For mood control 07/27/13  Yes Benjamine Mola, FNP   BP 127/72  Pulse 99  Temp(Src) 97.8 F (36.6  C) (Oral)  Resp 20  Ht 5\' 8"  (1.727 m)  Wt 212 lb (96.163 kg)  BMI 32.24 kg/m2  SpO2 96%  Physical Exam  Nursing note and vitals reviewed. Constitutional: He is oriented to person, place, and time. He appears well-developed and well-nourished.  Non-toxic appearance. He does not have a sickly appearance. He does not appear ill. No distress.  HENT:  Head: Normocephalic and atraumatic.  Eyes: Conjunctivae and EOM are normal.  Neck: Neck supple.  Cardiovascular: Normal rate.   Pulmonary/Chest: Effort normal. No respiratory distress.  Musculoskeletal: Normal range of motion.  Neurological: He is alert and oriented to person, place, and time.  Skin: Skin is warm  and dry. He is not diaphoretic.  Right upper extremity: 6 x 6 area of scabbing, with surrounding blistering with serous sanguinous drainage and erythema. Erythemia well within the demarcated line. Mild warmth to touch.  Psychiatric: He has a normal mood and affect. His behavior is normal.    ED Course  Procedures (including critical care time)  COORDINATION OF CARE: 11:36 AM-Discussed treatment plan with pt at bedside and pt agreed to plan.   Labs Review Labs Reviewed - No data to display  Imaging Review No results found.   EKG Interpretation None      MDM   Final diagnoses:  Visit for wound check  Nausea vomiting and diarrhea   Patient presents for a wound check, afebrile. Seen 6/29, EMR reviewed picture viewed. PE shows evolving skin lesion with decrease in urine edema. We'll have the patient continue clindamycin and Zofran for nausea vomiting. Discussed treatment plan with the patient. Return precautions given. Reports understanding and no other concerns at this time.  Patient is stable for discharge at this time.  Meds given in ED:  Medications - No data to display  New Prescriptions   ONDANSETRON (ZOFRAN) 4 MG TABLET    Take 1 tablet (4 mg total) by mouth every 6 (six) hours.    I personally performed the services described in this documentation, which was scribed in my presence. The recorded information has been reviewed and is accurate.  Lorrine Kin, PA-C 08/11/13 1153

## 2013-08-11 NOTE — Discharge Instructions (Signed)
Call for a follow up appointment with a Family or Primary Care Provider.  Continue taking the clindamycin as previously prescribed. Apply warm compress to the affected area 3-4 times a day. Return if Symptoms worsen.   Take medication as prescribed.

## 2013-08-11 NOTE — ED Provider Notes (Signed)
Medical screening examination/treatment/procedure(s) were performed by non-physician practitioner and as supervising physician I was immediately available for consultation/collaboration.  Richarda Blade, MD 08/11/13 [phone]

## 2014-02-11 HISTORY — PX: TOTAL HIP ARTHROPLASTY: SHX124

## 2014-03-11 ENCOUNTER — Emergency Department (HOSPITAL_COMMUNITY)
Admission: EM | Admit: 2014-03-11 | Discharge: 2014-03-11 | Disposition: A | Discharge status: Home / Self Care | Payer: Self-pay | Attending: Emergency Medicine | Admitting: Emergency Medicine

## 2014-03-11 ENCOUNTER — Encounter (HOSPITAL_COMMUNITY): Payer: Self-pay | Admitting: Physical Medicine and Rehabilitation

## 2014-03-11 DIAGNOSIS — M79643 Pain in unspecified hand: Secondary | ICD-10-CM | POA: Insufficient documentation

## 2014-03-11 DIAGNOSIS — Z8719 Personal history of other diseases of the digestive system: Secondary | ICD-10-CM | POA: Insufficient documentation

## 2014-03-11 DIAGNOSIS — Z720 Tobacco use: Secondary | ICD-10-CM | POA: Insufficient documentation

## 2014-03-11 DIAGNOSIS — Z791 Long term (current) use of non-steroidal anti-inflammatories (NSAID): Secondary | ICD-10-CM | POA: Insufficient documentation

## 2014-03-11 DIAGNOSIS — M549 Dorsalgia, unspecified: Secondary | ICD-10-CM | POA: Insufficient documentation

## 2014-03-11 DIAGNOSIS — I10 Essential (primary) hypertension: Secondary | ICD-10-CM | POA: Insufficient documentation

## 2014-03-11 DIAGNOSIS — Z8619 Personal history of other infectious and parasitic diseases: Secondary | ICD-10-CM | POA: Insufficient documentation

## 2014-03-11 DIAGNOSIS — F419 Anxiety disorder, unspecified: Secondary | ICD-10-CM | POA: Insufficient documentation

## 2014-03-11 DIAGNOSIS — Z79899 Other long term (current) drug therapy: Secondary | ICD-10-CM | POA: Insufficient documentation

## 2014-03-11 DIAGNOSIS — Z87448 Personal history of other diseases of urinary system: Secondary | ICD-10-CM | POA: Insufficient documentation

## 2014-03-11 DIAGNOSIS — G629 Polyneuropathy, unspecified: Secondary | ICD-10-CM | POA: Insufficient documentation

## 2014-03-11 DIAGNOSIS — G8929 Other chronic pain: Secondary | ICD-10-CM | POA: Insufficient documentation

## 2014-03-11 NOTE — ED Notes (Signed)
Patient unable to be located. Rooms checked, pt left before vitals or discharge instructions

## 2014-03-11 NOTE — Discharge Instructions (Signed)
Please call your doctor for a followup appointment within 24-48 hours. When you talk to your doctor please let them know that you were seen in the emergency department and have them acquire all of your records so that they can discuss the findings with you and formulate a treatment plan to fully care for your new and ongoing problems. Please follow-up with your primary care provider to be seen and assessed Please follow-up with primary doctor regarding follow-up with orthopedics Please talk with primary physician regarding pain management and physical therapy Please continue to monitor symptoms closely and if symptoms are to worsen or change (fever greater than 101, chills, sweating, nausea, vomiting, chest pain, shortness of breathe, difficulty breathing, weakness, numbness, tingling, worsening or changes to pain pattern, changes to pain, inability to control urine or bowel movements, swelling, redness, red streaks) please report back to the Emergency Department immediately.    Chronic Pain Discharge Instructions  Emergency care providers appreciate that many patients coming to [place] are in severe pain and we wish to address their pain in the safest, most responsible manner.  It is important to recognize however, that the proper treatment of chronic pain differs from that of the pain of injuries and acute illnesses.  Our goal is to provide quality, safe, personalized care and we thank you for giving [place] the opportunity to serve you. The use of narcotics and related agents for chronic pain syndromes may lead to additional physical and psychological problems.  Nearly as many people die from prescription narcotics each year as die from car crashes.  Additionally, this risk is increased if such prescriptions are obtained from a variety of sources.  Therefore, only your primary care physician or a pain management specialist is able to safely treat such syndromes with narcotic medications long-term.     Documentation revealing such prescriptions have been sought from multiple sources may prohibit [place] from providing a refill or different narcotic medication.  Your name may be checked first through the [place].  This database is a record of controlled substance medication prescriptions that the patient has received.  This has been established by [facility] in an effort to eliminate the dangerous, and often life threatening, practice of obtaining multiple prescriptions from different medical providers.   If you have a chronic pain syndrome (i.e. chronic headaches, recurrent back or neck pain, dental pain, abdominal or pelvis pain without a specific diagnosis, or neuropathic pain such as fibromyalgia) or recurrent visits for the same condition without an acute diagnosis, you may be treated with non-narcotics and other non-addictive medicines.  Allergic reactions or negative side effects that may be reported by a patient to such medications will not typically lead to the use of a narcotic analgesic or other controlled substance as an alternative.   Patients managing chronic pain with a personal physician should have provisions in place for breakthrough pain.  If you are in crisis, you should call your physician.  If your physician directs you to the emergency department, please have the doctor call and speak to our attending physician concerning your care.   When patients come to the Emergency Department (ED) with acute medical conditions in which the Emergency Department physician feels appropriate to prescribe narcotic or sedating pain medication, the physician will prescribe these in very limited quantities.  The amount of these medications will last only until you can see your primary care physician in his/her office.  Any patient who returns to the ED  seeking refills should expect only non-narcotic pain medications.   In the event of an acute medical  condition exists and the emergency physician feels it is necessary that the patient be given a narcotic or sedating medication -  a responsible adult driver should be present in the room prior to the medication being given by the nurse.   Prescriptions for narcotic or sedating medications that have been lost, stolen or expired will not be refilled in the Emergency Department.    Patients who have chronic pain may receive non-narcotic prescriptions until seen by their primary care physician.  It is every patients personal responsibility to maintain active prescriptions with his or her primary care physician or specialist.

## 2014-03-11 NOTE — ED Provider Notes (Signed)
CSN: [id number]     Arrival date & time 03/11/14  0945 History  This chart was scribed for non-physician practitioner, Jamse Mead, PA-C, working with Orpah Greek, *, by Stephania Fragmin, ED Scribe. This patient was seen in room TR05C/TR05C and the patient's care was started at 11:40 AM.    Chief Complaint  Patient presents with  . Pain   The history is provided by the patient. No language interpreter was used.     HPI Comments: Blake Arnold is a 44 y.o. male with a history of A-fib, hepatitis C, chronic back pain, depression, anxiety, GERD, peripheral neuropathy, history of urinary frequency, hypertension, polysubstance abuse, and seizures, who presents to the Emergency Department complaining of constant pain in his bilateral knees, hips, and left hands more than the right, ongoing for 1 year, as well as chronic back pain that began 5-6 years ago. He is able to ambulate with occasional assistance from crutches. He denies recent falls or injury. Patient takes Tylenol 325 mg for pain. He states his PCP advised him to see an orthopedist, and also told him he couldn't prescribe him pain medication because he has a history of narcotic abuse. Patient has no pain management, stating he files with [facility]. He denies associated numbness, tingling, weakness, bladder or bowel incontinence, difficulty swallowing, blurred vision, sudden loss of vision, neck pain, neck stiffness, headaches, fever, chest pain, SOB, hematuria, or other urinary symptoms. [facility] [place]   Past Medical History  Diagnosis Date  . Hepatitis C   . Chronic back pain   . A-fib   . Depression   . Anxiety   . Hepatitis C     history of  . Chronic back pain   . Acid reflux   . History of urinary frequency   . Peripheral neuropathy     hands and feet  . Hypertension   . Polysubstance abuse 01/25/2011  . Alcohol abuse   . Seizures    Past Surgical History  Procedure Laterality Date  .  Cardioversion  03/07/2006   History reviewed. No pertinent family history. History  Substance Use Topics  . Smoking status: Current Every Day Smoker -- 2.00 packs/day for 25 years    Types: Cigarettes  . Smokeless tobacco: Former Systems developer    Quit date: 10/24/2012  . Alcohol Use: 0.0 oz/week     Comment: 1/5 Vodka; 1-40oz Daily     Review of Systems  Constitutional: Negative for fever.  HENT: Negative for trouble swallowing.   Eyes: Negative for visual disturbance.  Respiratory: Negative for shortness of breath.   Cardiovascular: Negative for chest pain.  Genitourinary: Negative for dysuria, urgency, frequency, hematuria, decreased urine volume, enuresis and difficulty urinating.  Musculoskeletal: Positive for myalgias and back pain. Negative for neck pain and neck stiffness.  Neurological: Negative for weakness, numbness and headaches.      Allergies  Review of patient's allergies indicates no known allergies.  Home Medications   Prior to Admission medications   Medication Sig Start Date End Date Taking? Authorizing Provider  acamprosate (CAMPRAL) 333 MG tablet Take 2 tablets (666 mg total) by mouth 3 (three) times daily with meals. For alcohol dependence 07/27/13   Benjamine Mola, FNP  clindamycin (CLEOCIN) 150 MG capsule 3 PO TID for 10 days 08/09/13   Resa Miner Lawyer, PA-C  diclofenac (VOLTAREN) 50 MG EC tablet Take 1 tablet (50 mg total) by mouth 3 (three) times daily as needed (joint pain/ inflammation). 07/27/13  Benjamine Mola, FNP  gabapentin (NEURONTIN) 300 MG capsule Take 1 capsule (300 mg total) by mouth 2 (two) times daily. For substance withdrawal syndrome 02/22/13   Encarnacion Slates, NP  lisinopril (PRINIVIL,ZESTRIL) 10 MG tablet Take 1 tablet (10 mg total) by mouth daily. 07/27/13   Benjamine Mola, FNP  ondansetron (ZOFRAN) 4 MG tablet Take 1 tablet (4 mg total) by mouth every 6 (six) hours. 08/11/13   Harvie Heck, PA-C  risperiDONE (RISPERDAL) 2 MG tablet Take 1 tablet  (2 mg total) by mouth at bedtime. For mood control 07/27/13   Elyse Jarvis Withrow, FNP   BP 147/90 mmHg  Pulse 102  Temp(Src) 97.8 F (36.6 C) (Oral)  Resp 20  SpO2 96% Physical Exam  Constitutional: He is oriented to person, place, and time. He appears well-developed and well-nourished. No distress.  HENT:  Head: Normocephalic and atraumatic.  Eyes: Conjunctivae and EOM are normal. Right eye exhibits no discharge. Left eye exhibits no discharge.  Neck: Normal range of motion. Neck supple.  Cardiovascular: Normal rate, regular rhythm and normal heart sounds.  Exam reveals no friction rub.   No murmur heard. Pulses:      Radial pulses are 2+ on the right side, and 2+ on the left side.  Pulmonary/Chest: Effort normal and breath sounds normal. No respiratory distress. He has no wheezes. He has no rales.  Musculoskeletal: Normal range of motion. He exhibits tenderness.  Negative swelling, erythema, inflammation, lesions, sores, deformities identified to the spine. Mild discomfort upon palpation to the lumbosacral paravertebral regions bilaterally. Full ROM to upper and lower extremities without difficulty noted, negative ataxia noted.  Neurological: He is alert and oriented to person, place, and time. No cranial nerve deficit. He exhibits normal muscle tone. Coordination normal.  Cranial nerves III-XII grossly intact Strength 5+/5+ to upper and lower extremities bilaterally with resistance applied, equal distribution noted Equal grip strength bilaterally-right slightly more weak than left secondary to pain with producing a fist Sensation intact with differentiation sharp and dull touch Negative facial droop Negative slurred speech Negative aphasia. Patient is able to follow commands without difficulty Patient respond to questions appropriately Negative arm drift Fine motor skills intact Gait proper, proper balance - negative sway, negative drift, negative step-offs  Skin: Skin is warm and  dry. No rash noted. He is not diaphoretic. No erythema.  Psychiatric: He has a normal mood and affect. His behavior is normal. Thought content normal.  Nursing note and vitals reviewed.   ED Course  Procedures (including critical care time)  DIAGNOSTIC STUDIES: Oxygen Saturation is 96% on room air, normal by my interpretation.    COORDINATION OF CARE: 11:46 AM - Discussed treatment plan with pt at bedside which includes follow-up with PCP, referral to orthopedist, and possible PT, and pt agreed to plan.    Labs Review Labs Reviewed - No data to display  Imaging Review No results found.   EKG Interpretation None      MDM   Final diagnoses:  Chronic back pain  Chronic hand pain, unspecified laterality  Chronic pain    Medications - No data to display  Filed Vitals:   03/11/14 0950  BP: 147/90  Pulse: 102  Temp: 97.8 F (36.6 C)  TempSrc: Oral  Resp: 20  SpO2: 96%   I personally performed the services described in this documentation, which was scribed in my presence. The recorded information has been reviewed and is accurate.  Patient presenting to emergency department with chronic  back pain, hand pain, bilateral knee pain, bilateral leg pain that is been ongoing for approximately one year with back pain going on for 5-6 years. Patient reported that he is seen by his primary care provider who will not prescribe patient necrotic secondary to patient having narcotic addiction couple years ago. Patient reports that he is prescribed Tylenol 325 mg are not helping his pain. Patient does not have pain management. Patient has not followed up with orthopedics or specialist. Negative focal neurological deficits. Pulses palpable and strong. Sensation intact. Strength intact. Gait proper. Patient presenting to the ED with chronic pain. Patient stable, afebrile. Patient septic appearing. Discharged patient. Referred patient to primary care provider, hand specialist, orthopedics.  Discussed with patient that he'll have to have a sitdown conversation with his primary care provider regarding this ongoing pain and for recommendations regarding proper control. Discussed patient to avoid any physical or strenuous activity. Discussed with patient to closely monitor symptoms and if symptoms are to worsen or change to report back to the ED - strict return instructions given.  Patient agreed to plan of care, understood, all questions answered.   Jamse Mead, PA-C 03/11/14 [place], MD 03/11/14 [phone]

## 2014-03-11 NOTE — ED Notes (Signed)
Pt presents to department for evaluation of generalized pain all over body. Ongoing x1 year. No relief with medications at home. Pt states history of narcotic abuse. No signs of distress noted.

## 2014-11-04 ENCOUNTER — Encounter (HOSPITAL_COMMUNITY): Payer: Self-pay | Admitting: Emergency Medicine

## 2014-11-04 ENCOUNTER — Emergency Department (INDEPENDENT_AMBULATORY_CARE_PROVIDER_SITE_OTHER)
Admission: EM | Admit: 2014-11-04 | Discharge: 2014-11-04 | Disposition: A | Discharge status: Home / Self Care | Payer: No Typology Code available for payment source | Source: Home / Self Care | Attending: Emergency Medicine | Admitting: Emergency Medicine

## 2014-11-04 DIAGNOSIS — F17200 Nicotine dependence, unspecified, uncomplicated: Secondary | ICD-10-CM

## 2014-11-04 DIAGNOSIS — J40 Bronchitis, not specified as acute or chronic: Secondary | ICD-10-CM

## 2014-11-04 DIAGNOSIS — Z720 Tobacco use: Secondary | ICD-10-CM

## 2014-11-04 MED ORDER — DOXYCYCLINE HYCLATE 100 MG PO CAPS: 100.0000 mg | ORAL_CAPSULE | Freq: Two times a day (BID) | ORAL | Status: DC

## 2014-11-04 MED ORDER — PREDNISONE 50 MG PO TABS: ORAL_TABLET | ORAL | Status: DC

## 2014-11-04 MED ORDER — ALBUTEROL SULFATE 108 (90 BASE) MCG/ACT IN AEPB: 2.0000 | INHALATION_SPRAY | RESPIRATORY_TRACT | Status: DC | PRN

## 2014-11-04 MED ORDER — IPRATROPIUM-ALBUTEROL 0.5-2.5 (3) MG/3ML IN SOLN
3.0000 mL | Freq: Once | RESPIRATORY_TRACT | Status: AC
  Administered 2014-11-04: 3 mL via RESPIRATORY_TRACT

## 2014-11-04 MED ORDER — METHYLPREDNISOLONE ACETATE 80 MG/ML IJ SUSP
INTRAMUSCULAR | Status: AC
  Filled 2014-11-04: qty 1

## 2014-11-04 MED ORDER — METHYLPREDNISOLONE ACETATE 80 MG/ML IJ SUSP
80.0000 mg | Freq: Once | INTRAMUSCULAR | Status: AC
  Administered 2014-11-04: 80 mg via INTRAMUSCULAR

## 2014-11-04 MED ORDER — IPRATROPIUM-ALBUTEROL 0.5-2.5 (3) MG/3ML IN SOLN
RESPIRATORY_TRACT | Status: AC
  Filled 2014-11-04: qty 3

## 2014-11-04 NOTE — Discharge Instructions (Signed)
You have bronchitis. Take doxycycline and prednisone as prescribed. Use tessalon as needed for cough. Use the albuterol every 4 hours as needed for wheezing or cough. You should see improvement in the next 3-5 days. If you develop fevers, difficulty breathing, or are just not getting better, please come back or go to the emergency room.

## 2014-11-04 NOTE — ED Provider Notes (Signed)
CSN: [id number]     Arrival date & time 11/04/14  1614 History   First MD Initiated Contact with Patient 11/04/14 1635     Chief Complaint  Patient presents with  . URI   (Consider location/radiation/quality/duration/timing/severity/associated sxs/prior Treatment) HPI  He is a 44 year old man here for evaluation of cough. He states he has had cold symptoms, including nasal congestion, runny nose, cough, for the last 2 weeks. 5 days ago, he called his primary care doctor who put him on Sudafed and Tessalon. He states these medications have not helped. He reports continued nasal congestion and rhinorrhea. He also reports coughing spasms during which he feels like he cannot breathe. He also reports feeling short of breath outside of these coughing spells. He denies any chest pain. He reports a mildly irritated throat. He also states he has atrial fibrillation. He says when he gets to coughing, it flares his A. fib up. He denies any chest pain or palpitations at this time.  Past Medical History  Diagnosis Date  . Hepatitis C   . Chronic back pain   . A-fib   . Depression   . Anxiety   . Hepatitis C     history of  . Chronic back pain   . Acid reflux   . History of urinary frequency   . Peripheral neuropathy     hands and feet  . Hypertension   . Polysubstance abuse 01/25/2011  . Alcohol abuse   . Seizures    Past Surgical History  Procedure Laterality Date  . Cardioversion  03/07/2006   No family history on file. Social History  Substance Use Topics  . Smoking status: Current Every Day Smoker -- 2.00 packs/day for 25 years    Types: Cigarettes  . Smokeless tobacco: Former Systems developer    Quit date: 10/24/2012  . Alcohol Use: 0.0 oz/week     Comment: 1/5 Vodka; 1-40oz Daily     Review of Systems As in history of present illness Allergies  Review of patient's allergies indicates no known allergies.  Home Medications   Prior to Admission medications   Medication Sig Start Date  End Date Taking? Authorizing Provider  acamprosate (CAMPRAL) 333 MG tablet Take 2 tablets (666 mg total) by mouth 3 (three) times daily with meals. For alcohol dependence 07/27/13  Yes Benjamine Mola, FNP  benzonatate (TESSALON) 100 MG capsule Take by mouth 3 (three) times daily as needed for cough.   Yes Historical Provider, MD  benztropine (COGENTIN) 0.5 MG tablet Take 0.5 mg by mouth 2 (two) times daily.   Yes Historical Provider, MD  celecoxib (CELEBREX) 100 MG capsule Take 100 mg by mouth 2 (two) times daily.   Yes Historical Provider, MD  diclofenac (VOLTAREN) 50 MG EC tablet Take 1 tablet (50 mg total) by mouth 3 (three) times daily as needed (joint pain/ inflammation). 07/27/13  Yes Benjamine Mola, FNP  gabapentin (NEURONTIN) 300 MG capsule Take 1 capsule (300 mg total) by mouth 2 (two) times daily. For substance withdrawal syndrome 02/22/13  Yes Encarnacion Slates, NP  hydrochlorothiazide (HYDRODIURIL) 25 MG tablet Take 25 mg by mouth daily.   Yes Historical Provider, MD  lisinopril (PRINIVIL,ZESTRIL) 10 MG tablet Take 1 tablet (10 mg total) by mouth daily. 07/27/13  Yes Benjamine Mola, FNP  loratadine (CLARITIN) 10 MG tablet Take 10 mg by mouth daily.   Yes Historical Provider, MD  ranitidine (ZANTAC) 150 MG tablet Take 150 mg by mouth 2 (two) times  daily.   Yes Historical Provider, MD  risperiDONE (RISPERDAL) 2 MG tablet Take 1 tablet (2 mg total) by mouth at bedtime. For mood control 07/27/13  Yes Benjamine Mola, FNP  Albuterol Sulfate (PROAIR RESPICLICK) 211 (90 BASE) MCG/ACT AEPB Inhale 2 puffs into the lungs every 4 (four) hours as needed (wheezing; cough). 11/04/14   Melony Overly, MD  clindamycin (CLEOCIN) 150 MG capsule 3 PO TID for 10 days 08/09/13   Dalia Heading, PA-C  doxycycline (VIBRAMYCIN) 100 MG capsule Take 1 capsule (100 mg total) by mouth 2 (two) times daily. 11/04/14   Melony Overly, MD  ondansetron (ZOFRAN) 4 MG tablet Take 1 tablet (4 mg total) by mouth every 6 (six) hours.  08/11/13   Harvie Heck, PA-C  predniSONE (DELTASONE) 50 MG tablet Take 1 pill daily for 5 days. 11/04/14   Melony Overly, MD   Meds Ordered and Administered this Visit   Medications  methylPREDNISolone acetate (DEPO-MEDROL) injection 80 mg (not administered)  ipratropium-albuterol (DUONEB) 0.5-2.5 (3) MG/3ML nebulizer solution 3 mL (3 mLs Nebulization Given 11/04/14 1704)    BP 149/81 mmHg  Pulse 108  Temp(Src) 98.7 F (37.1 C) (Oral)  Resp 18  SpO2 96% No data found.   Physical Exam  Constitutional: He is oriented to person, place, and time. He appears well-developed and well-nourished. No distress.  HENT:  Nose: Nose normal.  Mouth/Throat: Oropharynx is clear and moist. No oropharyngeal exudate.  Eyes: Conjunctivae are normal.  Neck: Neck supple.  Cardiovascular: Regular rhythm and normal heart sounds.   No murmur heard. Tachycardic  Pulmonary/Chest: Effort normal. No respiratory distress. He has wheezes (bibasilar). He has no rales.  Lymphadenopathy:    He has no cervical adenopathy.  Neurological: He is alert and oriented to person, place, and time.    ED Course  Procedures (including critical care time)  Labs Review Labs Reviewed - No data to display  Imaging Review No results found.    MDM   1. Bronchitis   2. Current smoker    He is much improved after the breathing treatment. Heart rate remains around 100. Lung fields are clear. Wheezing has resolved.  Treat with prednisone and doxycycline. Albuterol as needed for wheezing. He will use the Tessalon for cough. Follow-up as needed.    Melony Overly, MD 11/04/14 1723

## 2014-11-04 NOTE — ED Notes (Signed)
C/o cold sx onset 2 weeks; not getting any better Sx include chest congestion, cough, dizziness, SOB, runny nose PCP has placed him on Sudafed and Benzonatate w/no relief Alert and oriented x4... No acute distress.

## 2014-12-09 ENCOUNTER — Encounter (HOSPITAL_COMMUNITY): Payer: Self-pay

## 2014-12-09 ENCOUNTER — Emergency Department (INDEPENDENT_AMBULATORY_CARE_PROVIDER_SITE_OTHER)
Admission: EM | Admit: 2014-12-09 | Discharge: 2014-12-09 | Disposition: A | Discharge status: Home / Self Care | Payer: No Typology Code available for payment source | Source: Home / Self Care | Attending: Family Medicine | Admitting: Family Medicine

## 2014-12-09 ENCOUNTER — Emergency Department (INDEPENDENT_AMBULATORY_CARE_PROVIDER_SITE_OTHER): Payer: No Typology Code available for payment source

## 2014-12-09 DIAGNOSIS — S2232XA Fracture of one rib, left side, initial encounter for closed fracture: Secondary | ICD-10-CM

## 2014-12-09 MED ORDER — MORPHINE SULFATE (PF) 2 MG/ML IV SOLN
4.0000 mg | Freq: Once | INTRAVENOUS | Status: AC
  Administered 2014-12-09: 4 mg via INTRAMUSCULAR

## 2014-12-09 MED ORDER — MORPHINE SULFATE (PF) 2 MG/ML IV SOLN: 4.0000 mg | Freq: Once | INTRAVENOUS | Status: DC

## 2014-12-09 MED ORDER — KETOROLAC TROMETHAMINE 60 MG/2ML IM SOLN
INTRAMUSCULAR | Status: AC
  Filled 2014-12-09: qty 2

## 2014-12-09 MED ORDER — TRAMADOL HCL 50 MG PO TABS
100.0000 mg | ORAL_TABLET | Freq: Four times a day (QID) | ORAL | Status: DC | PRN
Start: 2014-12-09 — End: 2017-07-24

## 2014-12-09 MED ORDER — MORPHINE SULFATE (PF) 2 MG/ML IV SOLN
INTRAVENOUS | Status: AC
  Filled 2014-12-09: qty 1

## 2014-12-09 MED ORDER — KETOROLAC TROMETHAMINE 60 MG/2ML IM SOLN
60.0000 mg | Freq: Once | INTRAMUSCULAR | Status: AC
  Administered 2014-12-09: 60 mg via INTRAMUSCULAR

## 2014-12-09 NOTE — ED Notes (Signed)
Patient states he had bronchitis a few weeks back Finished the medications and was feeling better This am he was brushing his teeth and gagged and heard a "pop" on his  Left side Patient states he is in a lot of pain

## 2014-12-09 NOTE — ED Provider Notes (Signed)
CSN: [id number]     Arrival date & time 12/09/14  1809 History   First MD Initiated Contact with Patient 12/09/14 1916     Chief Complaint  Patient presents with  . Chest Pain   (Consider location/radiation/quality/duration/timing/severity/associated sxs/prior Treatment) HPI   Rib pain: L side. Brushing teeth this am and gagged causing coughing episode and acute onset L rib pain. Felt a snap, per pt.  Tried 5mg  hydrocodone w/o benefit.  Worse w/ deep breathing.  Denies any central chest pain, shortness of breath, palpitations, nausea, vomiting, LOC, lightheadedness, dizziness, neck stiffness, headache. Patient to smoke.  Past Medical History  Diagnosis Date  . Hepatitis C   . Chronic back pain   . A-fib ([place])   . Depression   . Anxiety   . Hepatitis C     history of  . Chronic back pain   . Acid reflux   . History of urinary frequency   . Peripheral neuropathy (HCC)     hands and feet  . Hypertension   . Polysubstance abuse 01/25/2011  . Alcohol abuse   . Seizures [facility])    Past Surgical History  Procedure Laterality Date  . Cardioversion  03/07/2006   History reviewed. No pertinent family history. Social History  Substance Use Topics  . Smoking status: Current Every Day Smoker -- 2.00 packs/day for 25 years    Types: Cigarettes  . Smokeless tobacco: Former Systems developer    Quit date: 10/24/2012  . Alcohol Use: 0.0 oz/week     Comment: 1/5 Vodka; 1-40oz Daily     Review of Systems Per HPI with all other pertinent systems negative.   Allergies  Review of patient's allergies indicates no known allergies.  Home Medications   Prior to Admission medications   Medication Sig Start Date End Date Taking? Authorizing Provider  acamprosate (CAMPRAL) 333 MG tablet Take 2 tablets (666 mg total) by mouth 3 (three) times daily with meals. For alcohol dependence 07/27/13   Benjamine Mola, FNP  Albuterol Sulfate (PROAIR RESPICLICK) 638 (90 BASE) MCG/ACT AEPB Inhale 2 puffs into the  lungs every 4 (four) hours as needed (wheezing; cough). 11/04/14   Melony Overly, MD  benzonatate (TESSALON) 100 MG capsule Take by mouth 3 (three) times daily as needed for cough.    Historical Provider, MD  benztropine (COGENTIN) 0.5 MG tablet Take 0.5 mg by mouth 2 (two) times daily.    Historical Provider, MD  celecoxib (CELEBREX) 100 MG capsule Take 100 mg by mouth 2 (two) times daily.    Historical Provider, MD  clindamycin (CLEOCIN) 150 MG capsule 3 PO TID for 10 days 08/09/13   Dalia Heading, PA-C  diclofenac (VOLTAREN) 50 MG EC tablet Take 1 tablet (50 mg total) by mouth 3 (three) times daily as needed (joint pain/ inflammation). 07/27/13   Benjamine Mola, FNP  gabapentin (NEURONTIN) 300 MG capsule Take 1 capsule (300 mg total) by mouth 2 (two) times daily. For substance withdrawal syndrome 02/22/13   Encarnacion Slates, NP  hydrochlorothiazide (HYDRODIURIL) 25 MG tablet Take 25 mg by mouth daily.    Historical Provider, MD  lisinopril (PRINIVIL,ZESTRIL) 10 MG tablet Take 1 tablet (10 mg total) by mouth daily. 07/27/13   Benjamine Mola, FNP  loratadine (CLARITIN) 10 MG tablet Take 10 mg by mouth daily.    Historical Provider, MD  ondansetron (ZOFRAN) 4 MG tablet Take 1 tablet (4 mg total) by mouth every 6 (six) hours. 08/11/13   Harvie Heck,  PA-C  ranitidine (ZANTAC) 150 MG tablet Take 150 mg by mouth 2 (two) times daily.    Historical Provider, MD  risperiDONE (RISPERDAL) 2 MG tablet Take 1 tablet (2 mg total) by mouth at bedtime. For mood control 07/27/13   Benjamine Mola, FNP  traMADol (ULTRAM) 50 MG tablet Take 2 tablets (100 mg total) by mouth every 6 (six) hours as needed. 12/09/14   Waldemar Dickens, MD   Meds Ordered and Administered this Visit   Medications  morphine 2 MG/ML injection 4 mg (not administered)  ketorolac (TORADOL) injection 60 mg (not administered)    BP 155/95 mmHg  Pulse 93  Temp(Src) 98.2 F (36.8 C) (Oral)  Resp 20  SpO2 99% No data found.   Physical  Exam Physical Exam  Constitutional: oriented to person, place, and time. appears well-developed and well-nourished. No distress.  HENT:  Head: Normocephalic and atraumatic.  Eyes: EOMI. PERRL.  Neck: Normal range of motion.  Cardiovascular: RRR, no m/r/g, 2+ distal pulses,  Pulmonary/Chest: Effort normal and breath sounds normal. No respiratory distress.  Abdominal: Soft. Bowel sounds are normal. NonTTP, no distension.  Musculoskeletal: Tenderness along the left rib cage with superficial palpation between ribs 7-9.[name]  Neurological: alert and oriented to person, place, and time.  Skin: Skin is warm. No rash noted. non diaphoretic.  Psychiatric: normal mood and affect. behavior is normal. Judgment and thought content normal.   ED Course  Procedures (including critical care time)  Labs Review Labs Reviewed - No data to display  Imaging Review Dg Ribs Unilateral W/chest Left  12/09/2014  CLINICAL DATA:  Right rib pain began 2 weeks ago, patient felt a pop on the left side with left rib pain today, pain on inspiration EXAM: LEFT RIBS AND CHEST - 3+ VIEW COMPARISON:  07/25/2013 FINDINGS: Acute mildly displaced fracture lateral left eighth rib. No pneumothorax. Heart size unchanged. Mild diffuse interstitial prominence stable. No effusion. IMPRESSION: Acute left rib fracture. Mild diffuse interstitial lung disease Electronically Signed   By: Skipper Cliche M.D.   On: 12/09/2014 20:03     Visual Acuity Review  Right Eye Distance:   Left Eye Distance:   Bilateral Distance:    Right Eye Near:   Left Eye Near:    Bilateral Near:         MDM   1. Left rib fracture, closed, initial encounter    Morphine 4 mg and Toradol 60 mg IM given in clinic. Start tramadol. Discussed likely progression of this injury and the healing process as it will take several weeks. Recommend patient to walk regularly as this will help promote good aeration without deep breathing in order to prevent  pneumonia. Patient again his pneumonia shot from his primary care physician tomorrow.    Waldemar Dickens, MD 12/09/14 2018

## 2014-12-09 NOTE — Discharge Instructions (Signed)
You have poking your eighth rib on the left. You were given medications to help control your pain. Please use the tramadol as needed for additional pain. Antibiotic he may restart your Voltaren as prescribed. Please remember to take normal breaths as much as possible as this will help prevent pneumonia. This will take several weeks to heal. Postoperative primary care physician for your pneumonia shot

## 2015-06-20 ENCOUNTER — Encounter (HOSPITAL_COMMUNITY): Payer: Self-pay | Admitting: *Deleted

## 2015-06-20 ENCOUNTER — Ambulatory Visit (HOSPITAL_COMMUNITY)
Admission: EM | Admit: 2015-06-20 | Discharge: 2015-06-20 | Disposition: A | Discharge status: Home / Self Care | Payer: Self-pay | Attending: Family Medicine | Admitting: Family Medicine

## 2015-06-20 DIAGNOSIS — M25551 Pain in right hip: Secondary | ICD-10-CM

## 2015-06-20 DIAGNOSIS — M25552 Pain in left hip: Secondary | ICD-10-CM

## 2015-06-20 MED ORDER — METHYLPREDNISOLONE ACETATE 80 MG/ML IJ SUSP
80.0000 mg | Freq: Once | INTRAMUSCULAR | Status: AC
  Administered 2015-06-20: 80 mg via INTRAMUSCULAR

## 2015-06-20 MED ORDER — METHYLPREDNISOLONE ACETATE 80 MG/ML IJ SUSP
INTRAMUSCULAR | Status: AC
  Filled 2015-06-20: qty 1

## 2015-06-20 NOTE — ED Notes (Signed)
Pt  Reports  Chronic      Pain in both  Hips     He  denys  Any  specefic     Recent  Injury      He     Reports   Diagnosed  By  A  rhematologist        With  Osteonecrosis       Pt   States  Had  Both    Hips  X  Rayed   sev  Weeks  Ago

## 2015-06-20 NOTE — ED Provider Notes (Signed)
CSN: [id number]     Arrival date & time 06/20/15  1542 History   First MD Initiated Contact with Patient 06/20/15 1623     Chief Complaint  Patient presents with  . Hip Pain   (Consider location/radiation/quality/duration/timing/severity/associated sxs/prior Treatment) Patient is a 45 y.o. male presenting with hip pain. The history is provided by the patient.  Hip Pain This is a new problem. The current episode started more than 1 week ago (seen by rheumatologist 2 wks ago, wants to try MTX , pt gives a bogus story about [facility] referring him here for pain meds, just wants cortisone shot.[name]). Associated symptoms comments: Mainly lower ext joint issues..    Past Medical History  Diagnosis Date  . Hepatitis C   . Chronic back pain   . A-fib ([place])   . Depression   . Anxiety   . Hepatitis C     history of  . Chronic back pain   . Acid reflux   . History of urinary frequency   . Peripheral neuropathy (HCC)     hands and feet  . Hypertension   . Polysubstance abuse 01/25/2011  . Alcohol abuse   . Seizures [facility])    Past Surgical History  Procedure Laterality Date  . Cardioversion  03/07/2006   History reviewed. No pertinent family history. Social History  Substance Use Topics  . Smoking status: Current Every Day Smoker -- 2.00 packs/day for 25 years    Types: Cigarettes  . Smokeless tobacco: Former Systems developer    Quit date: 10/24/2012  . Alcohol Use: 0.0 oz/week     Comment: 1/5 Vodka; 1-40oz Daily     Review of Systems  Constitutional: Negative.   Musculoskeletal: Positive for arthralgias and gait problem. Negative for joint swelling.  Skin: Negative.   All other systems reviewed and are negative.   Allergies  Review of patient's allergies indicates no known allergies.  Home Medications   Prior to Admission medications   Medication Sig Start Date End Date Taking? Authorizing Provider  acamprosate (CAMPRAL) 333 MG tablet Take 2 tablets (666 mg total) by mouth 3 (three)  times daily with meals. For alcohol dependence 07/27/13   Benjamine Mola, FNP  Albuterol Sulfate (PROAIR RESPICLICK) 123XX123 (90 BASE) MCG/ACT AEPB Inhale 2 puffs into the lungs every 4 (four) hours as needed (wheezing; cough). 11/04/14   Melony Overly, MD  benzonatate (TESSALON) 100 MG capsule Take by mouth 3 (three) times daily as needed for cough.    Historical Provider, MD  benztropine (COGENTIN) 0.5 MG tablet Take 0.5 mg by mouth 2 (two) times daily.    Historical Provider, MD  celecoxib (CELEBREX) 100 MG capsule Take 100 mg by mouth 2 (two) times daily.    Historical Provider, MD  clindamycin (CLEOCIN) 150 MG capsule 3 PO TID for 10 days 08/09/13   Dalia Heading, PA-C  diclofenac (VOLTAREN) 50 MG EC tablet Take 1 tablet (50 mg total) by mouth 3 (three) times daily as needed (joint pain/ inflammation). 07/27/13   Benjamine Mola, FNP  gabapentin (NEURONTIN) 300 MG capsule Take 1 capsule (300 mg total) by mouth 2 (two) times daily. For substance withdrawal syndrome 02/22/13   Encarnacion Slates, NP  hydrochlorothiazide (HYDRODIURIL) 25 MG tablet Take 25 mg by mouth daily.    Historical Provider, MD  lisinopril (PRINIVIL,ZESTRIL) 10 MG tablet Take 1 tablet (10 mg total) by mouth daily. 07/27/13   Benjamine Mola, FNP  loratadine (CLARITIN) 10 MG tablet Take  10 mg by mouth daily.    Historical Provider, MD  ondansetron (ZOFRAN) 4 MG tablet Take 1 tablet (4 mg total) by mouth every 6 (six) hours. 08/11/13   Harvie Heck, PA-C  ranitidine (ZANTAC) 150 MG tablet Take 150 mg by mouth 2 (two) times daily.    Historical Provider, MD  risperiDONE (RISPERDAL) 2 MG tablet Take 1 tablet (2 mg total) by mouth at bedtime. For mood control 07/27/13   Benjamine Mola, FNP  traMADol (ULTRAM) 50 MG tablet Take 2 tablets (100 mg total) by mouth every 6 (six) hours as needed. 12/09/14   Waldemar Dickens, MD   Meds Ordered and Administered this Visit   Medications  methylPREDNISolone acetate (DEPO-MEDROL) injection 80 mg (not  administered)    BP 125/86 mmHg  Pulse 87  Temp(Src) 98.1 F (36.7 C) (Oral)  Resp 16  SpO2 96% No data found.   Physical Exam  Constitutional: He is oriented to person, place, and time. He appears well-developed and well-nourished. No distress.  Abdominal: Soft. Bowel sounds are normal. There is no tenderness.  Musculoskeletal: He exhibits tenderness. He exhibits no edema.  Neurological: He is alert and oriented to person, place, and time.  Skin: Skin is warm and dry.  Nursing note and vitals reviewed.   ED Course  Procedures (including critical care time)  Labs Review Labs Reviewed - No data to display  Imaging Review No results found.   Visual Acuity Review  Right Eye Distance:   Left Eye Distance:   Bilateral Distance:    Right Eye Near:   Left Eye Near:    Bilateral Near:         MDM   1. Hip pain, bilateral        Billy Fischer, MD 06/20/15 1746

## 2015-06-20 NOTE — Discharge Instructions (Signed)
See your doctor as needed

## 2015-09-13 ENCOUNTER — Encounter (HOSPITAL_COMMUNITY): Payer: Self-pay | Admitting: Emergency Medicine

## 2015-09-13 ENCOUNTER — Ambulatory Visit (HOSPITAL_COMMUNITY)
Admission: EM | Admit: 2015-09-13 | Discharge: 2015-09-13 | Disposition: A | Discharge status: Home / Self Care | Payer: Self-pay | Attending: Family Medicine | Admitting: Family Medicine

## 2015-09-13 DIAGNOSIS — W19XXXA Unspecified fall, initial encounter: Secondary | ICD-10-CM

## 2015-09-13 DIAGNOSIS — L0291 Cutaneous abscess, unspecified: Secondary | ICD-10-CM

## 2015-09-13 DIAGNOSIS — L0390 Cellulitis, unspecified: Secondary | ICD-10-CM

## 2015-09-13 HISTORY — DX: Idiopathic aseptic necrosis of unspecified bone: M87.00

## 2015-09-13 HISTORY — DX: Rheumatoid vasculitis with rheumatoid arthritis of unspecified site: M05.20

## 2015-09-13 MED ORDER — SULFAMETHOXAZOLE-TRIMETHOPRIM 800-160 MG PO TABS: 1.0000 | ORAL_TABLET | Freq: Two times a day (BID) | ORAL | 0 refills | Status: AC

## 2015-09-13 NOTE — ED Provider Notes (Signed)
CSN: [id number]     Arrival date & time 09/13/15  [id number] History   First MD Initiated Contact with Patient 09/13/15 1018     Chief Complaint  Patient presents with  . Fall   (Consider location/radiation/quality/duration/timing/severity/associated sxs/prior Treatment) [name] is a 45-yr-old male presents today with a chief complaint of a fall and an abscess. The abscess is his main chief complaint and reports that he wants to mention the fall since he is here anyway. Abscess is located at the right upper medial thigh. The abscess has been present for a few weeks, he have tried to squeeze the abscess and was able to get some drainage out previously. He reports pain at the site of the abscess.  He fell yesterday due to loss of balance and landed on his right side. He now complaints of right shoulder pain and right rib pain. He reports the pain is tolerable at 7//10. He denies any limitation in range of motion.     Fall  Pertinent negatives include no chest pain and no shortness of breath.    Past Medical History:  Diagnosis Date  . A-fib ([place])   . Acid reflux   . Alcohol abuse   . Anxiety   . AVN (avascular necrosis of bone) ([place])   . Chronic back pain   . Chronic back pain   . Depression   . Hepatitis C   . Hepatitis C    history of  . History of urinary frequency   . Hypertension   . Peripheral neuropathy (HCC)    hands and feet  . Polysubstance abuse 01/25/2011  . Rheumatoid arteritis   . Seizures ([place])    Past Surgical History:  Procedure Laterality Date  . CARDIOVERSION  03/07/2006   No family history on file. Social History  Substance Use Topics  . Smoking status: Current Every Day Smoker    Packs/day: 1.00    Years: 25.00    Types: Cigarettes  . Smokeless tobacco: Former Systems developer    Quit date: 10/24/2012  . Alcohol use No     Comment: 1/5 Vodka; 1-40oz Daily     Review of Systems  Constitutional: Negative for chills, fatigue and fever.  Respiratory: Negative  for cough and shortness of breath.   Cardiovascular: Negative for chest pain, palpitations and leg swelling.  Musculoskeletal:       Positive for fall; right shoulder blade pain and right upper rib pain. Neg for limited ROM  Skin:       Positive for abscess to right upper medial thigh    Allergies  Lisinopril  Home Medications   Prior to Admission medications   Medication Sig Start Date End Date Taking? Authorizing Provider  acamprosate (CAMPRAL) 333 MG tablet Take 2 tablets (666 mg total) by mouth 3 (three) times daily with meals. For alcohol dependence 07/27/13   Benjamine Mola, FNP  Albuterol Sulfate (PROAIR RESPICLICK) 123XX123 (90 BASE) MCG/ACT AEPB Inhale 2 puffs into the lungs every 4 (four) hours as needed (wheezing; cough). 11/04/14   Melony Overly, MD  benzonatate (TESSALON) 100 MG capsule Take by mouth 3 (three) times daily as needed for cough.    Historical Provider, MD  benztropine (COGENTIN) 0.5 MG tablet Take 0.5 mg by mouth 2 (two) times daily.    Historical Provider, MD  celecoxib (CELEBREX) 100 MG capsule Take 100 mg by mouth 2 (two) times daily.    Historical Provider, MD  clindamycin (CLEOCIN) 150 MG capsule 3 PO  TID for 10 days 08/09/13   Dalia Heading, PA-C  diclofenac (VOLTAREN) 50 MG EC tablet Take 1 tablet (50 mg total) by mouth 3 (three) times daily as needed (joint pain/ inflammation). 07/27/13   Benjamine Mola, FNP  gabapentin (NEURONTIN) 300 MG capsule Take 1 capsule (300 mg total) by mouth 2 (two) times daily. For substance withdrawal syndrome 02/22/13   Encarnacion Slates, NP  hydrochlorothiazide (HYDRODIURIL) 25 MG tablet Take 25 mg by mouth daily.    Historical Provider, MD  lisinopril (PRINIVIL,ZESTRIL) 10 MG tablet Take 1 tablet (10 mg total) by mouth daily. 07/27/13   Benjamine Mola, FNP  loratadine (CLARITIN) 10 MG tablet Take 10 mg by mouth daily.    Historical Provider, MD  ondansetron (ZOFRAN) 4 MG tablet Take 1 tablet (4 mg total) by mouth every 6 (six) hours.  08/11/13   Harvie Heck, PA-C  ranitidine (ZANTAC) 150 MG tablet Take 150 mg by mouth 2 (two) times daily.    Historical Provider, MD  risperiDONE (RISPERDAL) 2 MG tablet Take 1 tablet (2 mg total) by mouth at bedtime. For mood control 07/27/13   Benjamine Mola, FNP  sulfamethoxazole-trimethoprim (BACTRIM DS,SEPTRA DS) 800-160 MG tablet Take 1 tablet by mouth 2 (two) times daily. 09/13/15 09/20/15  Billy Fischer, MD  traMADol (ULTRAM) 50 MG tablet Take 2 tablets (100 mg total) by mouth every 6 (six) hours as needed. 12/09/14   Waldemar Dickens, MD   Meds Ordered and Administered this Visit  Medications - No data to display  BP 140/84   Pulse 84   Temp 97.3 F (36.3 C) (Oral)   Resp 16   Ht 5\' 8"  (1.727 m)   Wt 266 lb (120.7 kg)   SpO2 95%   BMI 40.45 kg/m  No data found.   Physical Exam  Constitutional: He is oriented to person, place, and time. He appears well-developed and well-nourished.  HENT:  Head: Normocephalic and atraumatic.  Neck: Normal range of motion. Neck supple.  Cardiovascular: Normal rate, regular rhythm and normal heart sounds.   Pulmonary/Chest: Effort normal and breath sounds normal.  Musculoskeletal:  Right shoulder with full ROM, tender to palpate at right upper rib and right scapula.   Neurological: He is alert and oriented to person, place, and time.  Skin:  A small area of redness and induration (size of a dime) noted at right upper inner thigh consistent with appearance of an abscess, the abscess appears that it had been draining.     Urgent Care Course   Clinical Course    Procedures (including critical care time)  Labs Review Labs Reviewed - No data to display  Imaging Review No results found.   Visual Acuity Review  Right Eye Distance:   Left Eye Distance:   Bilateral Distance:    Right Eye Near:   Left Eye Near:    Bilateral Near:         MDM   1. Abscess and cellulitis   2. Fall, initial encounter    1) Patient had no  significant injury from the fall. Xray not indicated. Patient instructed to take continue his pain medications at home for his pain. Educated that his pain should improve.   2) His abscess is not indicated for Incision and drainage. It appears that the abscess had somewhat been drained (Patient had been squeezing at the abscess and reports that he got some drainage out) Patient discharged home with antibiotic. Instructed to apply warm  compress 4x per day; 15-20 min each time. He was instructed to follow up with his PCP if the abscess does not resolve or improve.     Barry Dienes, NP 09/13/15 1355

## 2015-09-13 NOTE — ED Triage Notes (Signed)
PT was crouching on the ground, attempted to stand up, and fell onto right side. PT reports pain in right shoulder and back. 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 PT also thinks a new medication is causing him to have boils on his thighs and he does not want these to affect an upcoming hip surgery.

## 2015-11-23 DIAGNOSIS — G622 Polyneuropathy due to other toxic agents: Secondary | ICD-10-CM | POA: Insufficient documentation

## 2015-11-23 DIAGNOSIS — G4733 Obstructive sleep apnea (adult) (pediatric): Secondary | ICD-10-CM | POA: Insufficient documentation

## 2015-12-28 DIAGNOSIS — M1990 Unspecified osteoarthritis, unspecified site: Secondary | ICD-10-CM | POA: Insufficient documentation

## 2016-03-04 DIAGNOSIS — R200 Anesthesia of skin: Secondary | ICD-10-CM | POA: Insufficient documentation

## 2016-08-19 ENCOUNTER — Ambulatory Visit: Admission: RE | Admit: 2016-08-19 | Discharge: 2016-08-19 | Disposition: A | Discharge status: Home / Self Care

## 2016-08-19 DIAGNOSIS — R200 Anesthesia of skin: Principal | ICD-10-CM

## 2016-09-02 ENCOUNTER — Ambulatory Visit: Admission: RE | Admit: 2016-09-02 | Discharge: 2016-09-02 | Disposition: A | Discharge status: Home / Self Care

## 2016-09-02 ENCOUNTER — Ambulatory Visit
Admission: RE | Admit: 2016-09-02 | Discharge: 2016-09-02 | Disposition: A | Discharge status: Home / Self Care | Attending: Critical Care Medicine | Admitting: Critical Care Medicine

## 2016-09-02 DIAGNOSIS — R0602 Shortness of breath: Principal | ICD-10-CM

## 2016-09-02 MED ORDER — UMECLIDINIUM 62.5 MCG-VILANTEROL 25 MCG/ACTUATION POWDR FOR INHALATION
Freq: Every day | RESPIRATORY_TRACT | 11 refills | 0 days | Status: CP
Start: 2016-09-02 — End: 2016-09-16

## 2016-09-06 ENCOUNTER — Ambulatory Visit: Admission: RE | Admit: 2016-09-06 | Discharge: 2016-09-06 | Disposition: A | Discharge status: Home / Self Care

## 2016-09-06 DIAGNOSIS — M4802 Spinal stenosis, cervical region: Principal | ICD-10-CM

## 2016-09-06 MED FILL — ANORO ELLIPTA (30 DOSES)/62.5MCG-25MCG/AEPB: ANORO ELLIPTA (30 DOSES)/62.5MCG-25MCG/AEPB | 30 days supply | Qty: 1 | Fill #0

## 2016-09-16 ENCOUNTER — Ambulatory Visit: Admission: RE | Admit: 2016-09-16 | Discharge: 2016-09-16 | Disposition: A | Discharge status: Home / Self Care

## 2016-09-16 DIAGNOSIS — R0602 Shortness of breath: Principal | ICD-10-CM

## 2016-09-16 MED ORDER — MOMETASONE-FORMOTEROL HFA 200 MCG-5 MCG/ACTUATION AEROSOL INHALER
Freq: Two times a day (BID) | RESPIRATORY_TRACT | 11 refills | 0.00000 days | Status: CP
Start: 2016-09-16 — End: 2016-10-10

## 2016-09-18 MED ORDER — METHOTREXATE SODIUM 25 MG/ML INJECTION SOLUTION
SUBCUTANEOUS | 3 refills | 0.00000 days | Status: CP
Start: 2016-09-18 — End: 2016-12-09

## 2016-09-18 MED ORDER — SYRINGE WITH NEEDLE 1 ML 25 GAUGE X 5/8": 1 [IU] | Syringe | 3 refills | 0 days | Status: AC

## 2016-09-18 MED ORDER — SYRINGE WITH NEEDLE 1 ML 25 GAUGE X 5/8"
31 refills | 0.00000 days | Status: CP
Start: 2016-09-18 — End: 2017-09-18

## 2016-09-18 MED ORDER — METHOTREXATE SODIUM 25 MG/ML INJECTION SOLUTION: 25 mg | mL | 3 refills | 0 days | Status: AC

## 2016-09-24 NOTE — Unmapped (Addendum)
Specialty Pharmacy - [facility] Rheumatology Telephone Call     Joseph Sandoval. Joseph Sandoval is a 46 y.o. male contacted today regarding assessment of his specialty medication(s): subq methotrexate.    Patient has been on methotrexate, new to pharmacy refill/assessment calls.  Briefly reviewed injection techniques and side effect profiles with Mr. Joseph Sandoval.  He is bleeding more with injection, reviewed injection techniques and advised patient to use abd as injection site.      Reviewed and verified with patient: Medications -      Specialty medication(s) and dose(s) confirmed: yes  Changes to medications: no  Changes to insurance: no    Medication Adherence    Patient reported X missed doses in the last month:  0  Specialty Medication:  SUBQ METHOTREXATE   Patient is on additional specialty medications:  No  Reliability of informant:  reliable  Provider-estimated medication adherence level:  good  Medication Assistance Program  Medication Program Assistance Provided (If Applicable):  PATIENT IS APPROVED FOR PAP   Refill Coordination  Has the Patient's Contact Information Changed:  No  Is the Shipping Address Different:  No  Shipping Information  Delivery Scheduled:  Yes  Delivery Date:  10/08/16  Medications to be Shipped:  Methotrexate (next inj due from this shipment on 9/8), folic acid, syringe w/ needles, hydroxychloroquine       Adverse Effects    Visual disturbance:  Pos     Patient started on hydroxychloroquine recently and experiencing changes in vision that is worsened during later part of the day.  Advised patient to go for an eye exam and call clinic if symptoms worsen.      Joseph Sandoval. Jay reports RA symptoms not doing much better since resuming subq methotrexate and starting HCQ 1.5 months ago.  Advised patient that medications can take 2-3 months to work and he may call clinic if symptoms worsen.      Does [name] have follow up appointment scheduled with clinic? Yes, appointment is scheduled and patient is aware    All questions were answered and contact information provided for any future questions/concerns.      Jeneen Montgomery

## 2016-09-30 ENCOUNTER — Ambulatory Visit: Admission: RE | Admit: 2016-09-30 | Discharge: 2016-09-30 | Admitting: Orthopaedic Surgery

## 2016-09-30 DIAGNOSIS — G5603 Carpal tunnel syndrome, bilateral upper limbs: Principal | ICD-10-CM

## 2016-09-30 NOTE — Unmapped (Signed)
Contacts for Dr. Jari Pigg Team:      Clinic Contact: Archie Patten, [place]    [email or web address]   Phone [phone]   Fax [phone]     Surgery Scheduler: Tammy Sours   [phone] (at recording press #3)     Appointments: [phone] (at the recording press the number 1)    Surgery Date: Revision left carpal tunnel release on 10/08/2016 and Right carpal tunnel release on 10/22/2016    You need to have the full day available on the day of your surgery.  We do not set surgery times.  The OR nurses will call you the day before your surgery, typically between 2:00-5:00 pm to inform you of what time to arrive for surgery.  If you have not received a call by 5:00 pm please call them at [phone].     If you are on any anticoagulants such as Aspirin, Coumadin, Plavix, Xeralto, etc. please let us know so we can discuss whether you should abstain from it one week prior to surgery.     You will bleed less during surgery if you stop taking any anti-inflammatory medications at least one week prior to surgery.  These medications include: Ibuprofen (Advil, Motrin,) Naproxyn (Naprosyn, Aleve,) Indomethacin (Indocin,) and several other prescription anti-inflammatories commonly prescribed for arthritis or other musculoskeletal conditions. Please discuss stopping any anti-inflammatory medication with [place] or your primary care doctor. Acetaminophen (Tylenol) is a good pain reliever choice that does not increase bleeding to consider as long as you do not have any previous liver damage.  *Do not stop your aspirin if it is taken to prevent a stroke or heart attack.     Do not eat or drink anything after midnight the night before your surgery unless instructed otherwise.  If you are diabetic, check with your primary care physician for special instructions concerning your insulin dosage prior to surgery.  Do not take your morning medications unless specifically instructed, typically only medications that control the heart rate.    Do not wear any deodorant the day of surgery.  We recommend that you wear comfortable loose fitting cloths that will not be restrictive to your surgical site.  Please leave jewelry, credit cards, cash, and other valuables at home.  Do not wear any piercings, nail polish, make-up, or metal hair clips the day of your surgery.  Contact lenses, glasses, and dentures must be removed before surgery. Please bring a case to store them in during surgery.     Any patient under the age of 17 must be accompanied by their parent or legal guardian who must stay at the surgery center from the time of arrival until the time of discharge.     When you are ready to be discharged home from your surgery you must have an adult to take you home.  It will not be safe for you to drive or take public transportation alone.

## 2016-10-01 NOTE — Unmapped (Signed)
ORTHOPAEDIC RETURN CLINIC NOTE     ASSESSMENT:  Joseph Sandoval is a 46 y.o. male with bilateral carpal tunnel syndrome also with underlying cervical spine stenosis    PLAN:  We had a conversation with the patient today. We reviewed his EMG/NCV studies with him which do show evidence of moderate to severe carpal tunnel syndrome bilaterally. Additionally, he does have evidence of cervical spine stenosis and has seen Dr. Jaynie Kerwood for this. He may be a candidate for cervical spine surgery in the future per Dr. Duanne Moron notes. His symptoms on my exam are largely consistent with carpal tunnel syndrome in his hands. I advised him that carpal tunnel release bilaterally might relieve some or most of his hand symptoms, however I am unsure how much of his symptoms would be relieved given his underlying cervical spine disease. Given his positive provocative tests, I do believe it is likely he will receive quite a bit of relief from carpal tunnel release. He would like to try carpal tunnel release prior to proceeding with cervical spine surgery if possible. I believe this is reasonable. We discussed the carpal tunnel release procedure with him today and he wished to proceed with bilateral carpal tunnel release in a staged fashion with the first side being done on 10/08/16 and the second side being a 10/22/16. We completed all preoperative paperwork today including the preoperative consent. We will see him back postoperatively.    PROCEDURES:  None    CHIEF COMPLAINT: Bilateral carpal tunnel syndrome    INTERIM HISTORY:  The patient returns today for follow-up on his EMG studies and follow-up after evaluation for cervical spine. Imaging showed evidence of multilevel cervical stenosis and he may be a candidate for cervical spine surgery. He continues to complain of bothersome pain and numbness in his hands and his median nerve distribution specifically. He states that his symptoms have only been getting worse. He states that he continues to be awakened every night by numbness and pain in his hands. He denies fevers, chills, signs or symptoms of infection.    OBJECTIVE:  Physical Examination:    General  Well nourished, appearing stated age   Not in acute distress   Alert and oriented x3  Appropriate affect and mood  Appropriate to conversation   No increased work of breathing on room air    Musculoskeletal  Bilateral Upper Extremity:  Skin  -appears pink and well perfused with no evidence of trauma.   ROM  -active ROM fingers, wrist, elbow is painless.   Inspection/Palpation  - No tenderness to palpation about the elbow, wrist, or hand   No wasting of the thenar or interossei muscles appreciated  Motor/Strength  -Wrist extension, wrist flexion, IO, grip strength 5/5  APB and opponens strength is 5/5  Sensory  -sensation to light touch intact in median, radial and ulnar nerve distributions  Stability  - No gross instability noted at the elbow, wrist, or fingers  Vascular  -fingers are warm, with brisk capillary refill  Provocative testing  -Carpal tunnel Tinel's positive   Durkin's compressive testing positive at the carpal tunnel     IMAGING:  EMG/NCV test reviewed which show evidence of 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

## 2016-10-07 DIAGNOSIS — G5603 Carpal tunnel syndrome, bilateral upper limbs: Principal | ICD-10-CM

## 2016-10-07 MED FILL — FOLIC ACID/1MG/TABS: FOLIC ACID/1MG/TABS | 90 days supply | Qty: 90 | Fill #0

## 2016-10-07 MED FILL — METHOTREXATE/25MG/ML/SOLN: METHOTREXATE/25MG/ML/SOLN | 84 days supply | Qty: 6 | Fill #0

## 2016-10-07 MED FILL — HYDROXYCHLOROQUINE SULFATE/200MG/TAB: HYDROXYCHLOROQUINE SULFATE/200MG/TAB | 90 days supply | Qty: 180 | Fill #1

## 2016-10-07 MED FILL — BD TB 1mL/25G 5/8 INCH/SYR: BD TB 1mL/25G 5/8 INCH/SYR | 84 days supply | Qty: 12 | Fill #1

## 2016-10-08 ENCOUNTER — Ambulatory Visit
Admission: RE | Admit: 2016-10-08 | Discharge: 2016-10-08 | Disposition: A | Discharge status: Home / Self Care | Attending: Anesthesiology | Admitting: Anesthesiology

## 2016-10-08 ENCOUNTER — Ambulatory Visit
Admission: RE | Admit: 2016-10-08 | Discharge: 2016-10-08 | Disposition: A | Discharge status: Home / Self Care | Attending: Orthopaedic Surgery | Admitting: Orthopaedic Surgery

## 2016-10-08 DIAGNOSIS — G5603 Carpal tunnel syndrome, bilateral upper limbs: Principal | ICD-10-CM

## 2016-10-08 MED ORDER — HYDROCODONE 5 MG-ACETAMINOPHEN 325 MG TABLET
ORAL_TABLET | Freq: Four times a day (QID) | ORAL | 0 refills | 0.00000 days | Status: CP | PRN
Start: 2016-10-08 — End: 2017-02-26

## 2016-10-08 MED FILL — HYDROCODONE-ACETAM/5-325MG/TABS: HYDROCODONE-ACETAM/5-325MG/TABS | 2 days supply | Qty: 5 | Fill #0

## 2016-10-08 NOTE — Unmapped (Signed)
ORTHOPAEDIC RETURN CLINIC NOTE     ASSESSMENT:  Joseph Sandoval is a 46 y.o. male with bilateral carpal tunnel syndrome also with underlying cervical spine stenosis    PLAN:  We had a conversation with the patient today. We reviewed his EMG/NCV studies with him which do show evidence of moderate to severe carpal tunnel syndrome bilaterally. Additionally, he does have evidence of cervical spine stenosis and has seen Dr. Jaynie Kerwood for this. He may be a candidate for cervical spine surgery in the future per Dr. Duanne Moron notes. His symptoms on my exam are largely consistent with carpal tunnel syndrome in his hands. I advised him that carpal tunnel release bilaterally might relieve some or most of his hand symptoms, however I am unsure how much of his symptoms would be relieved given his underlying cervical spine disease. Given his positive provocative tests, I do believe it is likely he will receive quite a bit of relief from carpal tunnel release. He would like to try carpal tunnel release prior to proceeding with cervical spine surgery if possible. I believe this is reasonable. We discussed the carpal tunnel release procedure with him today and he wished to proceed with bilateral carpal tunnel release in a staged fashion with the first side being done on 10/08/16 and the second side being a 10/22/16. We completed all preoperative paperwork today including the preoperative consent. We will see him back postoperatively.    PROCEDURES:  None    CHIEF COMPLAINT: Bilateral carpal tunnel syndrome    INTERIM HISTORY:  The patient returns today for follow-up on his EMG studies and follow-up after evaluation for cervical spine. Imaging showed evidence of multilevel cervical stenosis and he may be a candidate for cervical spine surgery. He continues to complain of bothersome pain and numbness in his hands and his median nerve distribution specifically. He states that his symptoms have only been getting worse. He states that he continues to be awakened every night by numbness and pain in his hands. He denies fevers, chills, signs or symptoms of infection.    OBJECTIVE:  Physical Examination:    General  Well nourished, appearing stated age   Not in acute distress   Alert and oriented x3  Appropriate affect and mood  Appropriate to conversation   No increased work of breathing on room air    Musculoskeletal  Bilateral Upper Extremity:  Skin  -appears pink and well perfused with no evidence of trauma.   ROM  -active ROM fingers, wrist, elbow is painless.   Inspection/Palpation  - No tenderness to palpation about the elbow, wrist, or hand   No wasting of the thenar or interossei muscles appreciated  Motor/Strength  -Wrist extension, wrist flexion, IO, grip strength 5/5  APB and opponens strength is 5/5  Sensory  -sensation to light touch intact in median, radial and ulnar nerve distributions  Stability  - No gross instability noted at the elbow, wrist, or fingers  Vascular  -fingers are warm, with brisk capillary refill  Provocative testing  -Carpal tunnel Tinel's positive   Durkin's compressive testing positive at the carpal tunnel     IMAGING:  EMG/NCV test reviewed which show evidence of 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

## 2016-10-08 NOTE — Unmapped (Signed)
Orthopaedic Surgery Operative Note (CSN: [id number])  Date of Surgery: 10/08/2016  Admit Date: 10/08/2016  Attending Physician: Evalee Jefferson, MD      Preoperative Diagnosis: Left CTS    Postoperative Diagnosis: Carpal tunnel syndrome on left [G56.02]    Procedure:  1.  Neuroplasty and/or transposition; median nerve at carpal tunnel, left revision open carpal tunnel release, CPT 64721, modifier 22 for revision nature of procedure which required increased operative time and operative complexity given substantial scar tissue formation in the carpal tunnel     Resident Physician(s): Nelson Chimes, MD.    Anesthesia: Regional IV anesthesia plus sedation    Antibiotics: None indicated.    Tourniquet time:   Total Tourniquet Time Documented:  Arm  (Left) - 47 minutes  Total: Arm  (Left) - 47 minutes  .    Estimated Blood Loss: Minimal     Complications: None     Specimens: None     Indications for Surgery:    Joseph Sandoval is a 46 y.o. male with history of left carpal tunnel syndrome status post carpal tunnel release an outside facility. He states that he never got any symptom relief from his left carpal tunnel release. In addition he has cervical spine pathology. EMG/NCV studies confirmed that he had ongoing compression of his left median nerve at the carpal tunnel. After informing the patient of the benefits and risks of carpal tunnel release, revision, on the left side, he wished to proceed and preoperative consent was obtained. We did advise him of the possible likelihood of minimal symptom resolution given both the revision nature of the procedure and the fact that he has underlying fairly severe cervical spine disease. Despite these risks he wished to proceed.  He presents to the operating room today for surgical treatment.  The risks, benefits, alternatives, and complications of the procedure were explained to the patient and their family and all their questions were answered.        Operative Findings: Dense scar tissue surrounding the median nerve at the carpal tunnel which was meticulously dissected and carefully released requiring increased operative time. Successful decompression of the median nerve from the distal antebrachium to the level of the superficial palmar arch with no evidence of substantial compression following release. Successful decompression of terminal branches of the median nerve including the sensory branches to the second and third webspace as well as the recurrent motor branch of the median nerve.      Procedure:    The patient was identified in the preoperative holding area where the surgical site was marked with indelible ink. The patient was taken to the OR where a procedural timeout was called and the above noted anesthesia was induced. A Bier block was placed by anesthesia.  The patient's left upper extremity was prepped and draped in the usual sterile fashion.  Prior to the start of the surgical procedure a second preoperative timeout was called which included verifying the correct patient, the correct operative site, and correct procedure.    We then commenced with our operation.     We planned to reopen the patient's prior Bruner-type longitudinal incision over the mid palmar aspect of the wrist centered at the wrist crease. We extended this distally 3 cm and proximally 2 cm. The patient's prior incision was quite short and terminated quite proximally. We incised the skin with a knife. The underlying soft tissue was carefully dissected with tenotomy scissors. We identified the median nerve in the distal antebrachium  proximal to the patient's prior surgical incision. We noted that there was minimal scar tissue outside of the patient's prior surgical scar, however as we dissected in a proximal-to-distal direction, we encountered substantial scar tissue which necessitated time consuming and meticulous dissection given the revision nature of the procedure. We were able to carefully dissect scar tissue from the median nerve in a proximal-to-distal direction and carefully decompressed the median nerve as it entered the carpal tunnel. We released overlying regrown transverse carpal ligament. Distally, there was evidence of continued compression by the transverse carpal ligament which we released under direct visualization. We continued our dissection distally to the level of the superficial palmar arch which we identified and protected throughout our dissection. We carefully dissected the median nerve free of compression distal to the level of the palmar fat pad. There was no evidence of substantial compression on the median nerve following decompression. We further dissected out the terminal branches of the median nerve including sensory branches to the second and third webspace and the recurrent motor branch. These appeared to be without compression at the conclusion of our dissection. After we were satisfied with complete decompression of the median nerve, we thoroughly irrigated the wound with sterile saline. The deep dermal layer was closed with 3-0 Vicryl in the distal antebrachium and the skin wound was closed with 4-0 Prolene. A sterile soft bandage was applied. The tourniquet was let down and brisk capillary refill was noted to return all digits. Anesthesia was reversed and the patient was transferred from the operating table to stretcher and taken to the PACU in stable condition with no competitions apparent.    Post-op Plan/Instructions:     The patient will be discharged home. [name]  He will be non weight-bearing on the operative extremity.  No DVT prophylaxis is indicated as the risk of DVT is acceptably low.  Follow up plan will be in 10-14 days for suture removal. .        Teaching Surgeon Attestation: Evalee Jefferson, MD was present, scrubbed and an active participant for the entire procedure.      Genora Arp   Date: 10/08/2016  Time: 11:49 AM

## 2016-10-09 MED FILL — FOLIC ACID/1MG/TABS: FOLIC ACID/1MG/TABS | 90 days supply | Qty: 90 | Fill #1

## 2016-10-09 MED FILL — BD TB 1mL/25G 5/8 INCH/SYR: BD TB 1mL/25G 5/8 INCH/SYR | 84 days supply | Qty: 12 | Fill #2

## 2016-10-10 MED ORDER — MOMETASONE-FORMOTEROL HFA 200 MCG-5 MCG/ACTUATION AEROSOL INHALER
Freq: Two times a day (BID) | RESPIRATORY_TRACT | 11 refills | 0 days | Status: CP
Start: 2016-10-10 — End: 2016-12-05

## 2016-10-21 DIAGNOSIS — G5601 Carpal tunnel syndrome, right upper limb: Principal | ICD-10-CM

## 2016-10-22 ENCOUNTER — Ambulatory Visit
Admission: RE | Admit: 2016-10-22 | Discharge: 2016-10-22 | Disposition: A | Discharge status: Home / Self Care | Attending: Certified Registered" | Admitting: Certified Registered"

## 2016-10-22 ENCOUNTER — Ambulatory Visit
Admission: RE | Admit: 2016-10-22 | Discharge: 2016-10-22 | Disposition: A | Discharge status: Home / Self Care | Attending: Orthopaedic Surgery

## 2016-10-22 DIAGNOSIS — G5601 Carpal tunnel syndrome, right upper limb: Principal | ICD-10-CM

## 2016-10-22 MED ORDER — HYDROCODONE 5 MG-ACETAMINOPHEN 325 MG TABLET
ORAL_TABLET | Freq: Four times a day (QID) | ORAL | 0 refills | 0.00000 days | Status: CP | PRN
Start: 2016-10-22 — End: 2017-02-26

## 2016-10-22 MED FILL — HYDROCODONE-ACETAM/5-325MG/TABS: HYDROCODONE-ACETAM/5-325MG/TABS | 2 days supply | Qty: 5 | Fill #0

## 2016-10-22 NOTE — Unmapped (Signed)
You received 1000mg  (1gm) of tylenol at 9:30am .   You can take your pain medication (which contains tylenol) at 3:30pm    Do not take more than 4gm of tylenol in a day.

## 2016-10-22 NOTE — Unmapped (Signed)
ORTHOPAEDIC RETURN CLINIC NOTE     ASSESSMENT:  Joseph Sandoval is a 46 y.o. male with bilateral carpal tunnel syndrome also with underlying cervical spine stenosis    PLAN:  We had a conversation with the patient today. We reviewed his EMG/NCV studies with him which do show evidence of moderate to severe carpal tunnel syndrome bilaterally. Additionally, he does have evidence of cervical spine stenosis and has seen Dr. Jaynie Kerwood for this. He may be a candidate for cervical spine surgery in the future per Dr. Duanne Moron notes. His symptoms on my exam are largely consistent with carpal tunnel syndrome in his hands. I advised him that carpal tunnel release bilaterally might relieve some or most of his hand symptoms, however I am unsure how much of his symptoms would be relieved given his underlying cervical spine disease. Given his positive provocative tests, I do believe it is likely he will receive quite a bit of relief from carpal tunnel release. He would like to try carpal tunnel release prior to proceeding with cervical spine surgery if possible. I believe this is reasonable. We discussed the carpal tunnel release procedure with him today and he wished to proceed with bilateral carpal tunnel release in a staged fashion with the first side being done on 10/08/16 and the second side being a 10/22/16. We completed all preoperative paperwork today including the preoperative consent. We will see him back postoperatively.    PROCEDURES:  None    CHIEF COMPLAINT: Bilateral carpal tunnel syndrome    INTERIM HISTORY:  The patient returns today for follow-up on his EMG studies and follow-up after evaluation for cervical spine. Imaging showed evidence of multilevel cervical stenosis and he may be a candidate for cervical spine surgery. He continues to complain of bothersome pain and numbness in his hands and his median nerve distribution specifically. He states that his symptoms have only been getting worse. He states that he continues to be awakened every night by numbness and pain in his hands. He denies fevers, chills, signs or symptoms of infection.    OBJECTIVE:  Physical Examination:    General  Well nourished, appearing stated age   Not in acute distress   Alert and oriented x3  Appropriate affect and mood  Appropriate to conversation   No increased work of breathing on room air    Musculoskeletal  Bilateral Upper Extremity:  Skin  -appears pink and well perfused with no evidence of trauma.   ROM  -active ROM fingers, wrist, elbow is painless.   Inspection/Palpation  - No tenderness to palpation about the elbow, wrist, or hand   No wasting of the thenar or interossei muscles appreciated  Motor/Strength  -Wrist extension, wrist flexion, IO, grip strength 5/5  APB and opponens strength is 5/5  Sensory  -sensation to light touch intact in median, radial and ulnar nerve distributions  Stability  - No gross instability noted at the elbow, wrist, or fingers  Vascular  -fingers are warm, with brisk capillary refill  Provocative testing  -Carpal tunnel Tinel's positive   Durkin's compressive testing positive at the carpal tunnel     IMAGING:  EMG/NCV test reviewed which show evidence of 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

## 2016-10-22 NOTE — Unmapped (Signed)
Orthopaedic Surgery Operative Note (CSN: [id number])  Date of Surgery: 10/22/2016  Admit Date: 10/22/2016  Attending Physician: Corinna Gab MD        Preoperative Diagnosis: Right CTS     Postoperative Diagnosis: Carpal tunnel syndrome on right [G56.01]    Procedure:  1.  Open right carpal tunnel release, CPT 64721     Resident Physician(s): Willette Cluster, MD.    Anesthesia:  Local anesthesia plus monitored anesthesia care     Antibiotics: None indicated.    Tourniquet time:   Total Tourniquet Time Documented:  Arm  (Right) - 15 minutes  Total: Arm  (Right) - 15 minutes  .    Estimated Blood Loss: Minimal     Complications: None     Specimens: None     Indications for Surgery:    Joseph Sandoval is a 46 y.o. male with history of right carpal tunnel syndrome who has failed non operative management. The patient presents to the operating room today for surgical treatment. The risks, benefits, alternatives, and complications of the procedure were explained to the patient and their family and all their questions were answered.     Operative Findings:   Successful open release of the right carpal tunnel from the level of the distal antebrachial fascia to the distal extent of the transverse carpal ligament.    Procedure:   The patient was identified in the preoperative holding area where the surgical site was marked with indelible ink. The patient was taken to the OR where a procedural timeout was called and the above noted sedation was induced. A mix of 10 mL of lidocaine and bupivacaine without epinephrine as local anesthesia was injected into the carpal tunnel, wrist and palm. The patient's sutures were removed from the left upper extremity. The patient's right upper extremity was prepped and draped in the usual sterile fashion. Prior to the start of the surgical procedure a second preoperative timeout was called which included verifying the correct patient, the correct operative site, and correct procedure. We then commenced with our operation. An Esmarch bandage was used to exsanguinate the limb and the tourniquet was inflated.     We then made our 2 cm longitudinal incision just ulnar to the thenar crease in line with the radial border of the ring finger, extending proximally 1 cm distal to the distal wrist crease to a point at the intersection between Kaplan's cardinal line and the radial aspect of the ring finger, staying radial to the hook of the hamate. We then dissected the palmar fascia using senn retractors for visualization.  The palmar fascia was incised longitudinally and retractors were replaced.  We then identified the transverse carpal ligament and carefully incised it under direct visualization using a 15 blade. We identified the median nerve underneath and took care to protect it. We then visualized the most distal end of the transverse carpal ligament, where we took care to protect the superficial palmar arch and ensured that our release extended the length of the carpal tunnel distally, to a point just distal to the palmar fat pad. We turned our attention to the proximal aspect of our incision.  We replaced retractors and under direct visualization divided the distal antebrachial fascia using Metzenbaum scissors. After we had achieved adequate release, we palpated inside the carpal tunnel to appreciate any other compressive structures that might be present.  We appreciated the median nerve to be free from compression from the level of the distal forearm through the carpal  tunnel. The wound was thoroughly irrigated with sterile saline. The skin was closed with 4-0 Prolene suture. A sterile soft dressing was placed followed by an ace wrap. The patient was awoken from anesthesia and taken to the PACU in stable condition without complication.     Post-op Plan/Instructions:   The patient will be discharged home and may remove the dressing on postoperative day 4 and leave the incision open to air. No DVT prophylaxis is indicated as the risk of DVT is acceptably low. Follow up plan will be 10-14 days for suture removal.    Teaching Surgeon Attestation: Dr. Corinna Gab was present, scrubbed and an active participant for the entire procedure.     Carden Teel   Date: 10/22/2016  Time: 9:55 AM

## 2016-10-23 NOTE — Unmapped (Signed)
Pt. States in a lot of pain. Has not taken any pain meds. 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 Advised when home take med. And Elevate . States understanding.

## 2016-11-04 ENCOUNTER — Ambulatory Visit: Admission: RE | Admit: 2016-11-04 | Discharge: 2016-11-04 | Admitting: Orthopaedic Surgery

## 2016-11-04 DIAGNOSIS — G5603 Carpal tunnel syndrome, bilateral upper limbs: Principal | ICD-10-CM

## 2016-11-05 NOTE — Unmapped (Signed)
ORTHOPAEDIC RETURN CLINIC NOTE     ASSESSMENT:  Joseph Sandoval is a 46 y.o. male status post staged carpal tunnel releases, right was a primary carpal tunnel release, left was a revision carpal tunnel release  Date of surgery: Right side 10/22/2016, left side 10/08/2016    PLAN:  We did conversation with the patient today and believe he is doing well overall. He has full range of motion of his fingers and is able to make a full composite fist. I advised him that the discomfort that he has at his incision sites is to be expected at this point and will likely resolve over the next couple of months. I do believe that his incomplete symptom resolution on the left side may be more secondary to his cervical spine disease and we will have him continuing to follow-up with Dr. Jaynie Ruble for this. We will see him back on an as-needed basis. We removed his sutures today without difficulty from the right side.    INTERIM HISTORY:  Patient returns today for follow-up on his bilateral carpal tunnel releases. He has been doing relatively well. He states that his symptoms never really improved after his left revision carpal tunnel release but that they may be improving a little bit. He states that the symptoms on the right side have improved substantially and he is very pleased with the results of this. He continues to have some pain and numbness in the hands similar to preoperatively, but states that overall his condition is improved. He denies fevers, chills, signs or symptoms of infection.    PHYSICAL EXAM:  General: Well developed, well nourished male in no apparent distress  Musculoskeletal: Examination of the bilateral upper extremity reveals healed surgical incisions with no evidence of erythema, fluctuance, or drainage. He is grossly neurovascularly intact in this region to the median, ulnar, and radial nerves. Able to fire anterior interosseous, posterior interosseous, and ulnar nerves.    IMAGING:  None today

## 2016-12-02 ENCOUNTER — Ambulatory Visit
Admission: RE | Admit: 2016-12-02 | Discharge: 2016-12-02 | Disposition: A | Discharge status: Home / Self Care | Attending: Critical Care Medicine | Admitting: Critical Care Medicine

## 2016-12-02 DIAGNOSIS — G4733 Obstructive sleep apnea (adult) (pediatric): Principal | ICD-10-CM

## 2016-12-02 NOTE — Unmapped (Signed)
You CAN quit smoking!  Call 1-800-QUIT-NOW for help!    Please take your medications as prescribed.  Thank you for allowing me to be a part of your care.  Please call the clinic with any questions.    We will follow up with the pharmacy assistance program on the medication you can obtain through that program to help you obstructive lung disease symptoms.     Viviann Spare, MD  Pulmonary and Critical Care Medicine  [address]  [id number]  [place], [place] [address]    Thank you for your visit to the [facility].  You may receive a survey from [facility] regarding your visit today, and we are eager to use this feedback to improve your experience.  Thank you for taking the time to fill it out.    Between appointments, you can reach [place] at these numbers:    For appointments or the Pulmonary Nurse: [phone]  Fax: [phone]    For urgent issues after hours:  Hospital Operator: [phone], ask for Pulmonary Fellow on call

## 2016-12-02 NOTE — Unmapped (Signed)
Pulmonary Clinic - Initial Visit    Referring Physician :  Not In System Provider  PCP:     [facility]  Reason for Consult:   Shortness of breath     HISTORY:     History of Present Illness:  Mr. Joseph Joseph Sandoval is a 46 y.o. male with a history of paroxysmal A-fib, htn, asthma, Hep C, HPLD, prior substance abuse (etoh), Rheumatoid arthritis and OSA who presents for follow up evaluation in pulmonary clinic for evaluation of shortness of breath.     Pulmonary history:  Current every day smoker - 1-2 ppd currently. Started smoking at 46 years of age and has been continuous smoker for most of his life. Also notable is an extended period of inhaled crack cocaine use for 20 plus years. Quit this 3-4 years ago. No cigarillos/cigars or e-cigarettes reported.     Reports diagnosis of asthma in the past and was tried on an inhaler but is unsure on if it helped. Reports getting steroids once or twice in his life he reports due to bronchitis but denies recurrent pulmonary infections, hospitalizations, or persistent productive cough.     He reports developing SOB/DOE about 2 years ago. He thinks this started about time he was started on CPAP for OSA. However, he denies any progression over this time frame. In addition to the Dyspnea he reports chest tightness and occasional dry cough with activity. He occasionally produces dark brown sputum - this occurs usually in the mornings and sparingly. He says even taking the trash out takes his breath away. He reports occasionally poor sleep in general and sleeps on his side and occasional is dyspneic with movements in bed.     Overall he thinks his symptoms are stable and outside of shortness of breath, pain is his biggest limiting factor with his mobility and ability to exert himself. He reports non-compliance with this CPAP due to claustrophobia with the full face mask. He appears to follow in sleep clinic and hasn't tried alternative masks in the past.     Of note he is managed with MTX and has been on it for about 1.5 years. He recently started plaquenil for his Rheumatoid arthritis. Her is seropositive with CCP/RF+.      Interestingly he has had several CXR's that are notable for a subtle diffuse interstitial process dating back to even 2016 in care everywhere.     Interval history 11/2016:   Given persistent abnormal CXR. HRCT obtained showing emphysema but no signs of ILD or other airspace disease. We initiated Anoro Elipta for COPD/emphysema in setting of mild obstructive lung disease. He could not get the Anoro refilled and hasn't picked up the [facility] so has not been on any long-term treatment.  He is still trying to quit smoking but says he's stopped 3 days ago. Has patches/gum. Talked to psychiatrists about chantix/wellbutrin and not candidate.   Wondering about options for cpap mask he can't tolerate due to claustrophobia.     Pulmonary medications: Laure Kidney     Past Medical History:   Diagnosis Date   ??? Alcoholism (CMS-HCC)    ??? Anxiety    ??? Arthritis    ??? Asthma    ??? Atrial fibrillation (CMS-HCC)    ??? Bipolar affective (CMS-HCC)    ??? Bronchitis    ??? Depression    ??? Headache    ??? Hepatitis C    ??? Hyperlipidemia    ??? Hypertension    ??? Infectious  viral hepatitis    ??? Joint pain    ??? Lung disease    ??? Polysubstance abuse    ??? RA (rheumatoid arthritis) (CMS-HCC)    ??? Seizures (CMS-HCC)    ??? Shoulder injury    ??? Sleep apnea, obstructive    ??? Sleeping difficulties      Past Surgical History:   Procedure Laterality Date   ??? CARDIAC SURGERY      2008 shock therapy   ??? PR NERVOUS SYSTEM SURGERY UNLISTED N/A 03/21/2016    Procedure: UNLISTED PROC NERVOUS SYSTEM;  Surgeon: Coralee Pesa, MD;  Location: [facility];  Service: Orthopedics   ??? PR RAD EXCIS WRIST SYNOV/TENDON,FLEXOR Left 03/21/2016    Procedure: RAD EXC BURSA WRIST TENDON SHEATHS; FLEXORS;  Surgeon: Coralee Pesa, MD;  Location: [facility];  Service: Orthopedics   ??? PR REVISE MEDIAN N/CARPAL TUNNEL SURG Left 10/08/2016    Procedure: NEUROPLASTY AND/OR TRANSPOSITION; MEDIAN NERVE AT CARPAL TUNNEL--MOD 22--REVISION;  Surgeon: Garnett Farm, MD;  [facility];  Service: Ortho Hand   ??? PR REVISE MEDIAN N/CARPAL TUNNEL SURG Right 10/22/2016    Procedure: NEUROPLASTY AND/OR TRANSPOSITION; MEDIAN NERVE AT CARPAL TUNNEL;  Surgeon: Garnett Farm, MD;  Location: [facility];  Service: Ortho Hand   ??? PR TOTAL HIP ARTHROPLASTY Right 12/28/2015    Procedure: ARTHROPLASTY, ACETABULAR & PROXIMAL FEMORAL PROSTHETIC REPLACEMENT (TOTAL HIP), W/WO AUTOGRAFT/ALLOGRAFT;  Surgeon: Malka So, MD;  Location: [facility] OR [facility];  Service: Orthopedics   ??? PR WRIST ARTHROSCOP,RELEASE XVERS LIG Left 03/21/2016    Procedure: LEFT ENDOSCOPIC CARPAL TUNNEL RELEASE;  Surgeon: Coralee Pesa, MD;  Location: [facility];  Service: Orthopedics       Other History:  The social history and family history were personally reviewed and updated in the patient's electronic medical Joseph Sandoval.    Family History   Problem Relation Age of Onset   ??? Heart attack Mother    ??? Hypertension Mother    ??? Arthritis Mother    ??? Cancer Mother    ??? Heart disease Mother    ??? Hypertension Father    ??? Arthritis Father    ??? Heart disease Father    ??? Arthritis Sister    ??? Migraines Sister    ??? Fibromyalgia Paternal Aunt      Social History     Social History   ??? Marital status: Single     Spouse name: N/A   ??? Number of children: N/A   ??? Years of education: N/A     Occupational History   ??? Not on file.     Social History Main Topics   ??? Smoking status: Current Every Day Smoker     Packs/day: 1.00     Years: 30.00     Types: Cigarettes     Start date: 05/19/1984   ??? Smokeless tobacco: Former Neurosurgeon     Types: Snuff   ??? Alcohol use No      Comment: recovering   ??? Drug use: No   ??? Sexual activity: Not on file     Other Topics Concern   ??? Not on file     Social History Narrative   ??? No narrative on file       Home Medications:  Current Outpatient Prescriptions on File Prior to Visit   Medication Sig Dispense Refill   ??? acetaminophen (TYLENOL) 500 MG tablet Take 1,000 mg by mouth  every six (6) hours as needed for pain.     ??? amLODIPine (NORVASC) 5 MG tablet Take 10 mg by mouth daily.      ??? benztropine (COGENTIN) 0.5 MG tablet Take 0.5 mg by mouth Two (2) times a day.     ??? carBAMazepine (TEGRETOL) 200 mg tablet Take 100 mg by mouth Two (2) times a day.      ??? folic acid (FOLVITE) 1 MG tablet Take 1 tablet (1 mg total) by mouth daily. 90 tablet 3   ??? gabapentin (NEURONTIN) 400 MG capsule Take 400 mg by mouth Three (3) times a day.     ??? HYDROcodone-acetaminophen (NORCO) 5-325 mg per tablet Take 1 tablet by mouth every six (6) hours as needed for pain. for up to 5 doses 5 tablet 0   ??? HYDROcodone-acetaminophen (NORCO) 5-325 mg per tablet Take 1 tablet by mouth every six (6) hours as needed for pain. for up to 7 doses 5 tablet 0   ??? hydroxychloroquine (PLAQUENIL) 200 mg tablet Take 1 tablet (200 mg total) by mouth Two (2) times a day. 60 tablet 6   ??? methotrexate 25 mg/mL injection solution Inject 1 mL (25 mg total) under the skin once a week. 4 mL 3   ??? metoprolol tartrate (LOPRESSOR) 25 MG tablet Take 50 mg by mouth Two (2) times a day.      ??? mometasone-formoterol 200-5 mcg/actuation HFAA Inhale 2 puffs Two (2) times a day. 1 Inhaler 11   ??? ranitidine (ZANTAC) 150 MG tablet Take 150 mg by mouth Two (2) times a day.     ??? risperiDONE (RISPERDAL) 2 MG tablet Take 2 mg by mouth nightly.     ??? sertraline (ZOLOFT) 50 MG tablet Take 50 mg by mouth daily.  1   ??? simvastatin (ZOCOR) 20 MG tablet Take 20 mg by mouth nightly.     ??? syringe with needle (TUBERCULIN SYRINGE) 1 mL 25 gauge x 5/8 Syrg 1 Units by Miscellaneous route every seven (7) days. To be used with methotrexate 100 Syringe 3   ??? [DISCONTINUED] hydrochlorothiazide (HYDRODIURIL) 25 MG tablet Take 25 mg by mouth daily.       No current facility-administered medications on file prior to visit. Allergies:  Allergies as of 12/02/2016 - Reviewed 10/22/2016   Allergen Reaction Noted   ??? Lisinopril Swelling 01/20/2015       Review of Systems:  Ten systems were reviewed and were negative except as indicated in the above history.    PHYSICAL EXAM:   BP 134/75 (BP Site: L Arm, BP Position: Sitting, BP Cuff Size: X-Large)  - Pulse 78  - Temp 36.1 ??C (Oral)  - Ht 172.7 cm (5' 8)  - Wt (!) 119.7 kg (264 lb)  - BMI 40.14 kg/m??   General: Alert and oriented, no acute distress  HEENT: MMM, clear oropharynx  CV: RRR, distant heart sounds.   Lungs: fair air movement, no increased work of breathing and no additional breath sounds.   Abd: Soft, NT, ND, no rebound or guarding  Ext: Warm, well perfused, no peripheral edema  Skin: No rashes  Neuro: No focal deficits    LABORATORY and RADIOLOGY DATA:     Pulmonary Function Tests:  Date: FVC (% Pred) FEV1 (% Pred)  Pre-BD FEV1 (%Pred)  Post- BD FEV1/FVC FEF25-75(% Pred) DLCO (% Pred)   September 01, 2016  4.69 (93%)  2.97 (76%)  3.36 (85%)  0.63  1.41 (39%)  24.6 (79%)  Date: TLC (% Pred) VC (% Pred) FRC (% Pred) ERV (% Pred) RV (% Pred)   September 01, 2016  7.1 (104%)    4.92 (98%)  4.19 (93%)  1.20 (67%)  2.18 (112%)     My interpretation is:  Mild obstruction on spirometry, significant bronchodilator response. Normal DLCO. Lung volumes are normal.     Pertinent Laboratory Data:    No eosinophilia   CCP and RF positive    Pertinent Imaging Data:    07/2013 CXR (cone) - no active cardiopulmonary dz appreciated     11/2014 CXR (cone) - acute displaced lateral eighth rib fracture, mild diffuse interstitial prominence - stable.    02/2016 CXR - 1. Stable cardiomegaly. 2. Mild stable bilateral interstitial prominence consistent chronic interstitial lung disease.    09/16/16 HRCT - Resolution of bilateral basilar pulmonary parenchymal opacities on prone images consistent with dependent atelectasis. No focal airspace consolidation is seen. No air trapping is noted on expiratory images. There are no pathologically enlarged mediastinal, hilar, or axillary lymph nodes. Normal heart size. Upper lobe predominant paraseptal emphysema present.       ASSESSMENT and PLAN     Joseph Joseph Sandoval is a 46 y.o. male with history of paroxysmal A-fib, htn, asthma, Hep C, HPLD, prior substance abuse (etoh), Rheumatoid arthritis and OSA who presents for initial evaluation in pulmonary clinic for evaluation of shortness of breath.     1. Shortness of breath/COPD gold stage 2B:  - It appears his dyspnea is multifactorial with smoking related lung disease and possible asthma overlap given reversibility on bronchodilator testing and some history of exposure related symptoms.   - HRCT shows only apical paraseptal predominant emphysema - likely smoking related but consider A1AT next visit. No evidence of ILD despite abnormal chest radiographs.   - We provided him Anoro but could get it refilled through pharmacy assitance, Elwin Sleight order is still active and I asked him to pick up. Needs spacer with use and has at home.   - Will see him back in 3 months to follow response to treatment and plan further studies     2. OSA:  - untreated due to overt intolerance to mask interface  - referral to sleep medicine as may need re titration     3. Smoking:   - trying to quit actively, has nicotine replacement.   - encouraged.   - not good candidate for alternate cessation aides.     Health maitenance:   - flu shot given today   - candidate for pneumococcal vaccine - consider next visit.     The patient was seen with Dr. Eliberto Ivory and will return to clinic in 3 months.    CC:Not In System Provider, [facility]    Viviann Spare, MD, PhD  December 01, 2016  Pulmonary & Critical Care Fellow  [facility]   Pager: [phone]

## 2016-12-05 MED ORDER — MOMETASONE-FORMOTEROL HFA 200 MCG-5 MCG/ACTUATION AEROSOL INHALER: 2 | Inhaler | Freq: Two times a day (BID) | 11 refills | 0 days | Status: AC

## 2016-12-05 MED ORDER — MOMETASONE-FORMOTEROL HFA 200 MCG-5 MCG/ACTUATION AEROSOL INHALER
Freq: Two times a day (BID) | RESPIRATORY_TRACT | 9 refills | 0.00000 days | Status: CP
Start: 2016-12-05 — End: 2016-12-05

## 2016-12-05 NOTE — Unmapped (Signed)
Hi Dr. Nilsa Nutting,  Pt called requesting 2 prescriptions discussed at last appt be sent to [facility].  Please advise.  Thanks.

## 2016-12-07 MED ORDER — ALBUTEROL SULFATE HFA 90 MCG/ACTUATION AEROSOL INHALER
Freq: Four times a day (QID) | RESPIRATORY_TRACT | 11 refills | 0.00000 days | Status: CP | PRN
Start: 2016-12-07 — End: 2016-12-07

## 2016-12-07 MED ORDER — ALBUTEROL SULFATE HFA 90 MCG/ACTUATION AEROSOL INHALER: 2 | Inhaler | Freq: Four times a day (QID) | 11 refills | 0 days | Status: AC

## 2016-12-07 NOTE — Unmapped (Signed)
Thanks.  Advised pt of same.  When I called pt back he asked if you were also planning on prescribing a rescue inhaler for him to have on hand?

## 2016-12-09 ENCOUNTER — Ambulatory Visit
Admission: RE | Admit: 2016-12-09 | Discharge: 2016-12-09 | Disposition: A | Discharge status: Home / Self Care | Admitting: Orthopaedic Surgery

## 2016-12-09 ENCOUNTER — Ambulatory Visit
Admission: RE | Admit: 2016-12-09 | Discharge: 2016-12-09 | Disposition: A | Discharge status: Home / Self Care | Attending: Rheumatology | Admitting: Rheumatology

## 2016-12-09 DIAGNOSIS — G959 Disease of spinal cord, unspecified: Principal | ICD-10-CM

## 2016-12-09 DIAGNOSIS — M0579 Rheumatoid arthritis with rheumatoid factor of multiple sites without organ or systems involvement: Secondary | ICD-10-CM

## 2016-12-09 DIAGNOSIS — G5603 Carpal tunnel syndrome, bilateral upper limbs: Principal | ICD-10-CM

## 2016-12-09 LAB — CBC W/ AUTO DIFF
EOSINOPHILS ABSOLUTE COUNT: 0.2 10*9/L (ref 0.0–0.4)
HEMATOCRIT: 48.8 % (ref 41.0–53.0)
LARGE UNSTAINED CELLS: 4 % (ref 0–4)
MEAN CORPUSCULAR HEMOGLOBIN CONC: 34.7 g/dL (ref 31.0–37.0)
MEAN CORPUSCULAR HEMOGLOBIN: 34.2 pg — ABNORMAL HIGH (ref 26.0–34.0)
MEAN CORPUSCULAR VOLUME: 98.4 fL (ref 80.0–100.0)
MEAN PLATELET VOLUME: 7 fL (ref 7.0–10.0)
MONOCYTES ABSOLUTE COUNT: 0.3 10*9/L (ref 0.2–0.8)
NEUTROPHILS ABSOLUTE COUNT: 3.3 10*9/L (ref 2.0–7.5)
PLATELET COUNT: 223 10*9/L (ref 150–440)
RED BLOOD CELL COUNT: 4.96 10*12/L (ref 4.50–5.90)
RED CELL DISTRIBUTION WIDTH: 14 % (ref 12.0–15.0)

## 2016-12-09 LAB — CREATININE
CREATININE: 0.78 mg/dL (ref 0.70–1.30)
EGFR MDRD AF AMER: 129 mL/min/{1.73_m2} (ref >=60)
EGFR MDRD NON AF AMER: 107 mL/min/{1.73_m2} (ref >=60)

## 2016-12-09 LAB — BASOPHILS ABSOLUTE COUNT: Lab: 0

## 2016-12-09 LAB — BUN: BLOOD UREA NITROGEN: 7 mg/dL (ref 7–21)

## 2016-12-09 LAB — ALT (SGPT): Alanine aminotransferase:CCnc:Pt:Ser/Plas:Qn:: 59

## 2016-12-09 LAB — AST (SGOT): Aspartate aminotransferase:CCnc:Pt:Ser/Plas:Qn:: 32

## 2016-12-09 LAB — BLOOD UREA NITROGEN: Urea nitrogen:MCnc:Pt:Ser/Plas:Qn:: 7

## 2016-12-09 LAB — EGFR MDRD NON AF AMER: Glomerular filtration rate/1.73 sq M.predicted.non black:ArVRat:Pt:Ser/Plas/Bld:Qn:Creatinine-based formula (MDRD): 107

## 2016-12-09 LAB — ALBUMIN: Albumin:MCnc:Pt:Ser/Plas:Qn:: 4.7

## 2016-12-09 MED ORDER — HYDROXYCHLOROQUINE 200 MG TABLET: 200 mg | tablet | 4 refills | 0 days

## 2016-12-09 MED ORDER — ADALIMUMAB 40 MG/0.8 ML SUBCUTANEOUS SYRINGE KIT
SUBCUTANEOUS | 2 refills | 0.00000 days | Status: CP
Start: 2016-12-09 — End: 2016-12-09

## 2016-12-09 MED ORDER — METHOTREXATE SODIUM 25 MG/ML INJECTION SOLUTION: mL | 3 refills | 0 days

## 2016-12-09 MED ORDER — HYDROXYCHLOROQUINE 200 MG TABLET
ORAL_TABLET | Freq: Two times a day (BID) | ORAL | 6 refills | 0.00000 days | Status: CP
Start: 2016-12-09 — End: 2016-12-09

## 2016-12-09 MED ORDER — METHOTREXATE SODIUM 25 MG/ML INJECTION SOLUTION: 25 mg | mL | 3 refills | 0 days | Status: AC

## 2016-12-09 MED ORDER — ADALIMUMAB 40 MG/0.8 ML SUBCUTANEOUS SYRINGE KIT: 40 mg | each | 2 refills | 0 days | Status: AC

## 2016-12-09 MED ORDER — METHOTREXATE SODIUM 25 MG/ML INJECTION SOLUTION
SUBCUTANEOUS | 3 refills | 0.00000 days | Status: CP
Start: 2016-12-09 — End: 2016-12-09

## 2016-12-09 NOTE — Unmapped (Signed)
??   Your diagnosis: Cervical radiculopathy    ?? Recommendations/Plan  ?? Medications: Take over-the-counter medications as needed and as tolerated  ?? If you don't have liver disease, the safest medication for pain is acetaminophen (Tylenol).  Take 1000mg  (2 extra strength tabs) at a time for pain relief.  Do not take more than 3000mg  per day.  ?? Try Salonpas Lidocaine 4% patches (https://www.cook.com/)  ?? You are a candidate for surgery. (Anterior Cervical Decompression and Fusion-pending Pulmonary and Rheumatology Clearance.)  ?? See your Primary Care Provider for medical risk assessment for surgery  ?? We will message/fax a letter to your primary care provider for medical risk optimization/assessment  ?? Review the Spine Surgery Decision-Making Tool on our Facebook page ([email or web address]) or via a direct Prezi link below.  ?? Anterior Cervical Fusion ... [email or web address]  ?? Imaging studies: none  ?? Follow-up: To be determined.  Please contact us via MyChart or phone if any problems arise.  ?? After medical clearance.  ?? We will be happy to schedule your surgery once you have been cleared by your doctors.     ?? Avoid nicotine/tobacco  ?? Minimize alcohol intake  ?? Maintain a healthy weight    ?? Stay physically active and exercise regularly as tolerated ([email or web address])  ?? Maintain good sleeping habits    ?? We want to improve, and you can help.  You may receive a survey asking you about your visit.  Please take a few minutes to complete the survey.  We will use your feedback to make improvements.    ?? Contact our team electronically via MyChart message to [facility] Clinical Staff.    ?? Contact our team at [phone] or [phone] with any questions/concerns.    ?? Please visit and like [place] on our Facebook page to learn more about our services. [email or web address]

## 2016-12-09 NOTE — Unmapped (Signed)
adalimumab  Pronunciation:  AY da LIM ue mab  BrandRachel Bo, Cyltezo, Humira  What is the most important information I should know about adalimumab?  This medicine affects your immune system. You may get infections more easily, even serious or fatal infections.  Before or during treatment with adalimumab, tell your doctor if you have signs of infection such as fever, chills, aches, tiredness, cough, skin sores, diarrhea, or burning when you urinate.  What is adalimumab?  Adalimumab reduces the effects of a substance in the body that can cause inflammation.  Adalimumab is used to treat many inflammatory conditions in adults, such as ulcerative colitis, rheumatoid arthritis, psoriatic arthritis, ankylosing spondylitis, plaque psoriasis, and a skin condition called hidradenitis suppurativa.  Adalimumab is also used in adults and children to treat Crohn's disease or juvenile idiopathic arthritis.  Adalimumab may also be used for purposes not listed in this medication guide.  What should I discuss with my healthcare provider before using adalimumab?  You should not use this medicine if you are allergic to adalimumab.  Before you start using adalimumab, tell your doctor if you have signs of infection --fever, chills, sweats, muscle aches, tiredness, cough, bloody mucus, skin sores, diarrhea, burning when you urinate, or feeling constantly tired.  Adalimumab should not be given to a child younger than 66 years old (or 58 years old if treating Crohn's disease).  Children using adalimumab should be current on all childhood immunizations before starting treatment.  Tell your doctor if you have ever had:  ?? tuberculosis (or if anyone in your household has tuberculosis);  ?? cancer;  ?? hepatitis B (adalimumab can cause hepatitis B to come back or get worse);  ?? diabetes;  ?? congestive heart failure;  ?? any numbness or tingling, or a nerve-muscle disorder such as multiple sclerosis or Guillain-Barre syndrome;  ?? an allergy to latex rubber;  ?? if you are scheduled to have major surgery; or  ?? if you have recently received or are scheduled to receive any vaccine.  Tell your doctor where you live and if you have recently traveled or plan to travel. You may be exposed to infections that are common to certain areas of the world.  Adalimumab may cause a rare type of lymphoma (cancer) of the liver, spleen, and bone marrow that can be fatal. This has occurred mainly in teenagers and young men with Crohn's disease or ulcerative colitis. However, anyone with an inflammatory autoimmune disorder may have a higher risk of lymphoma. Talk with your doctor about your own risk.  It is not known whether this medicine will harm an unborn baby. Tell your doctor if you are pregnant. Make sure any doctor caring for your newborn baby knows if you used adalimumab while you were pregnant.  It may not be safe to breast-feed a baby while you are using this medicine. Ask your doctor about any risks.  How should I use adalimumab?  Follow all directions on your prescription label and read all medication guides or instruction sheets. Use the medicine exactly as directed.  Adalimumab is injected under the skin. A healthcare provider will teach you how to properly use this medicine by yourself.  Do not start using adalimumab if you have any signs of an infection. Call your doctor for instructions.  Read and carefully follow any instruction sheet provided with your medicine. Do not use adalimumab if you do not understand the instructions for proper use. Ask your doctor or pharmacist if you have any  questions.  The dose schedule for adalimumab is highly variable and depends on the condition you are treating. Follow your doctor's dosing instructions very carefully.  Prepare your injection only when you are ready to give it. Do not use if the medicine looks cloudy or has particles in it. Call your pharmacist for new medicine.  Adalimumab affects your immune system. You may get infections more easily, even serious or fatal infections. Your doctor will need to examine you on a regular basis.  Store this medicine in its original carton in a refrigerator. Do not freeze. If you are traveling, carefully follow all patient instructions for storing your medicine during travel.  Throw away any adalimumab that has become frozen.  Use a needle and syringe only once and then place them in a puncture-proof sharps container. Follow state or local laws about how to dispose of this container. Keep it out of the reach of children and pets.  What happens if I miss a dose?  Use the medicine as soon as you remember, and then go back to your regular injection schedule. Do not use extra medicine to make up the missed dose.  What happens if I overdose?  Seek emergency medical attention or call the Poison Help line at [phone].  What should I avoid while using adalimumab?  Do not inject adalimumab into skin that is bruised, red, tender, or hard.  Avoid being near people who are sick or have infections. Tell your doctor at once if you develop signs of infection.  Do not receive a live vaccine while using adalimumab. The vaccine may not work as well during this time, and may not fully protect you from disease. Live vaccines include measles, mumps, rubella (MMR), polio, rotavirus, typhoid, yellow fever, varicella (chickenpox), or zoster (shingles).  What are the possible side effects of adalimumab?  Get emergency medical help if you have any of these signs of an allergic reaction: hives; difficulty breathing; swelling of your face, lips, tongue, or throat.  Stop using adalimumab and call your doctor right away if you have any symptoms of lymphoma:  ?? fever, swollen glands, night sweats, general feeling of illness;  ?? joint and muscle pain, skin rash, easy bruising or bleeding;  ?? pale skin, feeling light-headed or short of breath, cold hands and feet;  ?? pain in your upper stomach that may spread to your shoulder; or  ?? loss of appetite, feeling full after eating only a small amount, weight loss.  Also call your doctor at once if you have:  ?? new or worsening psoriasis (raised, silvery flaking of the skin);  ?? liver problems --fever, body aches, tiredness, stomach pain, right-sided upper stomach pain, vomiting, loss of appetite, dark urine, clay-colored stools, jaundice (yellowing of the skin or eyes);  ?? lupus-like syndrome --joint pain or swelling, chest pain, shortness of breath, patchy skin color that worsens in sunlight;  ?? nerve problems --numbness, tingling, dizziness, vision problems, weakness in your arms or legs; or  ?? signs of tuberculosis --fever with ongoing cough, weight loss (fat or muscle).  Older adults may be more likely to develop infections or cancer while using adalimumab.  Common side effects may include:  ?? headache;  ?? cold symptoms such as stuffy nose, sinus pain, sneezing, sore throat;  ?? rash; or  ?? redness, bruising, itching, or swelling where the injection was given.  This is not a complete list of side effects and others may occur. Call your doctor for medical advice about  side effects. You may report side effects to FDA at 1-800-FDA-1088.  What other drugs will affect adalimumab?  Some drugs should not be used together with adalimumab. Tell your doctor about all medicines you use, and those you start or stop using during your treatment with adalimumab, especially:  ?? abatacept, etanercept;  ?? anakinra;  ?? azathioprine, mercaptopurine; or  ?? certolizumab, golimumab, infliximab, rituximab.  This list is not complete. Other drugs may interact with adalimumab, including prescription and over-the-counter medicines, vitamins, and herbal products. Not all possible interactions are listed in this medication guide.  Where can I get more information?  Your pharmacist can provide more information about adalimumab.  Remember, keep this and all other medicines out of the reach of children, never share your medicines with others, and use this medication only for the indication prescribed.  Every effort has been made to ensure that the information provided by [employer] ('[employer]') is accurate, up-to-date, and complete, but no guarantee is made to that effect. Drug information contained herein may be time sensitive. [employer] information has been compiled for use by healthcare practitioners and consumers in the [place] and therefore [employer] does not warrant that uses outside of the [place] are appropriate, unless specifically indicated otherwise. [employer]'s drug information does not endorse drugs, diagnose patients or recommend therapy. [employer]'s drug information is an Investment banker, corporate to assist licensed healthcare practitioners in caring for their patients and/or to serve consumers viewing this service as a supplement to, and not a substitute for, the expertise, skill, knowledge and judgment of healthcare practitioners. The absence of a warning for a given drug or drug combination in no way should be construed to indicate that the drug or drug combination is safe, effective or appropriate for any given patient. [employer] does not assume any responsibility for any aspect of healthcare administered with the aid of information [employer] provides. The information contained herein is not intended to cover all possible uses, directions, precautions, warnings, drug interactions, allergic reactions, or adverse effects. If you have questions about the drugs you are taking, check with your doctor, nurse or pharmacist.  Copyright [phone] [employer] Version: 13.01. Revision date: 03/12/2016.  Care instructions adapted under license by [facility]. If you have questions about a medical condition or this instruction, always ask your healthcare professional. [employer], Incorporated disclaims any warranty or liability for your use of this information.

## 2016-12-09 NOTE — Unmapped (Signed)
I, Nemiah Commander, MD, saw and evaluated the patient, participating in the key elements of the service.  I discussed the findings, assessment and plan with the resident and agree with resident???s findings and plan as documented in the resident's note.

## 2016-12-09 NOTE — Unmapped (Signed)
Assessment/Recommendations:   Joseph Sandoval is a 46 y.o. Caucasian male with history of prior polysubstance abuse, Hep C s/p Harvoni (fibroscan F1), HTN and neuropathy who presents for follow up of seropositive (+RF, +anti-CCP) rheumatoid arthritis. He continues to have active synovitis in his joints despite of being on MTX.  I am going to add Humira to his medications today.    1. seropositive nonerosive RA   - labs today CBC w/ diff, BUN/Cr, LFTs, Hep Panel and TB   - will resume methotrexate 25 mg sq weekly +folic acid daily   - continue HCQ 200 mg bid, UTD on eye exam   - added  Humira 14 mg sub q every 14 days.  Forward the chart to [facility].  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    2.  Possible Cervical Surgery   - advised the patient to inform me when is schedule to have his surgery and will stop his MTX 2 weeks before and 2 weeks after as well as stop the humira 2 weeks before and 2 weeks after.    3. Immunizations   - pneumovax today   - UTD on flu vaccine    RTC in 5  months or sooner prn.     Patient was discussed with attending physician, Dr.Adhikari     HPI:  Joseph Sandoval is a 46 y.o. Caucasian male with history of prior polysubstance abuse, Hep C s/p Harvoni, HTN and neuropathy who presents for follow up of seropositive (+RF, +anti-CCP) rheumatoid arthritis.        Interm History:  Patient continues to pain in all of his joints and hurts all of the time.  He has been taking his plaquenil and is UTD on eye exam and continues to take his plaquenil.  He continues to notes about 40-50 minutes of morning stiffness.He recently saw his orthopedic surgeon and is planning to have surgery in his c spine.  He was asking about his medication management during the time of surgery.    Other wise, denies any rashes or lesions.        Disease History:    He was initially referred 05/2015 after hepatology checked RF which +.  He had synovitis of MCPs, PIPs, wrists at the time and anti-CCP low positive.  He has hx of chronic back pain and opioid dependence.  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 He was started on MTX after clarifying with hepatology (fibroscan F1 only).  He was seen for follow up 08/2015 and transitioned from po to sq methotrexate now on 25 mg weekly.  He tolerates well without GI side effects.  At last visit 11/2015, he had only partial improvement so discussed adding TNF-I but this was deferred given upcoming THA.  He also underwent carpal tunnel release on the left 03/2016 due to paresthesias without improvement.   He had an MRI spine and referred to ortho spine due to concern for cervical radiculopathy as cause of his symptoms/paresthesias.    He was off off  MTX for about 1 month since he started getting nodules on his back and stopped. During his last visit in 6/18 pt was restarted on MTX 25 mg and also HCQ was added.   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  Alcoholism (CMS-HCC)    ??? Anxiety    ??? Arthritis    ??? Asthma    ??? Atrial fibrillation (CMS-HCC)    ??? Bipolar affective (CMS-HCC)    ??? Bronchitis    ??? Depression    ??? Headache    ??? Hepatitis C    ??? Hyperlipidemia    ??? Hypertension    ??? Infectious viral hepatitis    ??? Joint pain    ??? Lung disease    ??? Polysubstance abuse    ??? RA (rheumatoid arthritis) (CMS-HCC)    ??? Seizures (CMS-HCC)    ??? Shoulder injury    ??? Sleep apnea, obstructive    ??? Sleeping difficulties      Allergies:  Lisinopril    Medications:     Current outpatient prescriptions:   ???  amLODIPine (NORVASC) 5 MG tablet, Take 5 mg by mouth daily., Disp: , Rfl:   ???  aspirin (ECOTRIN) 81 MG tablet, Take 81 mg by mouth daily., Disp: , Rfl:   ???  baclofen (LIORESAL) 10 MG tablet, Take 10 mg by mouth Three (3) times a day. As needed, Disp: , Rfl:   ???  benztropine (COGENTIN) 0.5 MG tablet, Take 0.5 mg by mouth Two (2) times a day., Disp: , Rfl:   ???  carBAMazepine (TEGRETOL) 200 mg tablet, Take 200 mg by mouth Two (2) times a day., Disp: , Rfl:   ???  celecoxib (CELEBREX) 100 MG capsule, Take 100 mg by mouth Two (2) times a day. , Disp: , Rfl:   ???  diltiazem (CARDIZEM CD) 120 MG 24 hr capsule, Take 1 capsule (120 mg total) by mouth daily., Disp: 30 capsule, Rfl: 6  ???  gabapentin (NEURONTIN) 400 MG capsule, Take 400 mg by mouth Three (3) times a day., Disp: , Rfl:   ???  loratadine (CLARITIN) 10 mg tablet, Take 10 mg by mouth daily., Disp: , Rfl:   ???  metoprolol tartrate (LOPRESSOR) 25 MG tablet, Take 25 mg by mouth Two (2) times a day., Disp: , Rfl:   ???  naltrexone (DEPADE) 50 mg tablet, Take 50 mg by mouth nightly., Disp: , Rfl:   ???  ranitidine (ZANTAC) 150 MG tablet, Take 150 mg by mouth Two (2) times a day., Disp: , Rfl:   ???  risperiDONE (RISPERDAL) 2 MG tablet, Take 2 mg by mouth nightly., Disp: , Rfl:   ???  simvastatin (ZOCOR) 20 MG tablet, Take 20 mg by mouth nightly., Disp: , Rfl:   ???  [DISCONTINUED] hydrochlorothiazide (HYDRODIURIL) 25 MG tablet, Take 25 mg by mouth daily., Disp: , Rfl:   ???  albuterol sulfate (PROAIR RESPICLICK) 90 mcg/actuation AePB, Inhale., Disp: , Rfl:   ???  glucosamine HCl 500 mg Tab, Take 1 tablet by mouth Two (2) times a day., Disp: , Rfl:     Surgical History:  Past Surgical History:   Procedure Laterality Date   ??? CARDIAC SURGERY      2008 shock therapy   ??? PR NERVOUS SYSTEM SURGERY UNLISTED N/A 03/21/2016    Procedure: UNLISTED PROC NERVOUS SYSTEM;  Surgeon: Coralee Pesa, MD;  Location: [facility];  Service: Orthopedics   ??? PR RAD EXCIS WRIST SYNOV/TENDON,FLEXOR Left 03/21/2016    Procedure: RAD EXC BURSA WRIST TENDON SHEATHS; FLEXORS;  Surgeon: Coralee Pesa, MD;  Location: [facility];  Service: Orthopedics   ??? PR REVISE MEDIAN N/CARPAL TUNNEL SURG Left 10/08/2016    Procedure: NEUROPLASTY AND/OR TRANSPOSITION; MEDIAN  NERVE AT CARPAL TUNNEL--MOD 22--REVISION;  Surgeon: Garnett Farm, MD; Location: [facility];  Service: Ortho Hand   ??? PR REVISE MEDIAN N/CARPAL TUNNEL SURG Right 10/22/2016    Procedure: NEUROPLASTY AND/OR TRANSPOSITION; MEDIAN NERVE AT CARPAL TUNNEL;  Surgeon: Garnett Farm, MD;  [facility];  Service: Ortho Hand   ??? PR TOTAL HIP ARTHROPLASTY Right 12/28/2015    Procedure: ARTHROPLASTY, ACETABULAR & PROXIMAL FEMORAL PROSTHETIC REPLACEMENT (TOTAL HIP), W/WO AUTOGRAFT/ALLOGRAFT;  Surgeon: Malka So, MD;  [facility];  Service: Orthopedics   ??? PR WRIST ARTHROSCOP,RELEASE XVERS LIG Left 03/21/2016    Procedure: LEFT ENDOSCOPIC CARPAL TUNNEL RELEASE;  Surgeon: Coralee Pesa, MD;  Location: [facility];  Service: Orthopedics     Social History:  Social History   Substance Use Topics   ??? Smoking status: Current Every Day Smoker     Packs/day: 1.00     Years: 30.00     Types: Cigarettes     Start date: 05/19/1984   ??? Smokeless tobacco: Former Neurosurgeon     Types: Snuff   ??? Alcohol use No      Comment: recovering     Family History:  Arthritis - mother, father not sure what kind     Objective   Vitals:    12/09/16 1059   BP: 135/79   BP Site: L Arm   BP Position: Sitting   BP Cuff Size: Large   Pulse: 76   Temp: 35.4 ??C   TempSrc: Oral   Weight: (!) 121 kg (266 lb 12.1 oz)     Physical Exam  General:   Patient is well appearing, and does not appear to be in any acute distress   Eyes:   PERRLA, EOMI, and sclera aniteric.   ENT:   MMM. Oropharhynx without any erythema or exudate.  No oropharyngeal exudates or ulcerations    Neck:   Supple, no JVD, no cervical lymphadenopathy   Cardiovascular:  normal rate, regular rhythm.  S1 and S2 normal, without any murmur, rub, or gallop.   Pulmonary:  Clear to auscultation bilaterally, without                           Wheezes/crackles/rhonchi.  Good air movement. Normal work of breathing.   Skin:    No rash.lesions/breakdown. Normal turgor and elasticity.    No Raynaud???s phenomenon or digital ulcers. Psychiatry:   Mood and affect appropriate and congruent.    GI:   Normoactive bowel sounds, abdomen soft, non-tender and not distended, no hepatosplenomegaly or masses. No rebound or guarding.    Extremities:   Warm and well perfused. No bilateral cyanosis, clubbing or edema.   Musculo Skeletal:   Tenderness and swelling noted in  bilateral 2-3 MCPs, hand grip strength has been reduced.  Tenderness noted near the elbows.  no elbow contractures, normal ROM b/l shoulders.  Knee crepitus.  Reduced hip flexion due to low back pain, limited external rotation b/l hips.  No synovitis of the ankles or toes.     Neurological:  Cranial nerves III-XII grossly intact.  Strength 5/5        Test Results  No visits with results within 4 Week(s) from this visit.   Latest known visit with results is:   Hospital Outpatient Visit on 09/02/2016   Component Date Value   ??? FEV1 POST 09/02/2016 3.36    ??? FEV1/FVC POST 09/02/2016  66.22*   ??? FVC POST 09/02/2016 5.07    ??? PEF POST 09/02/2016 7.95    ??? VOL extrap post 09/02/2016 0.09    ??? FIVC POST 09/02/2016 3.51*   ??? PIF POST 09/02/2016 2.68*   ??? FIF50% POST 09/02/2016 2.13*   ??? FEV6 POST 09/02/2016 4.76    ??? FEV1/FEV6 POST 09/02/2016 70.6*   ??? FEF50% POST 09/02/2016 2.48    ??? FEF25-75% POST 09/02/2016 1.89*   ??? AOZ/HYQ65 post 09/02/2016 116.85    ??? ISOFEF25-75 POST 09/02/2016 2.46    ??? FET100% POST 09/02/2016 13.03    ??? FEV1 PRE 09/02/2016 2.97*   ??? FEV1/FVC PRE 09/02/2016 63.39*   ??? FVC PRE 09/02/2016 4.69    ??? PEF PRE 09/02/2016 8.82    ??? Vol extrap pre 09/02/2016 0.05    ??? FIVC PRE 09/02/2016 0.02*   ??? FEV6 PRE 09/02/2016 4.28    ??? FEV1/FEV6 PRE 09/02/2016 69.42*   ??? FEF50% PRE 09/02/2016 2    ??? FEF25-75% PRE 09/02/2016 1.41*   ??? ISOFEF25-75 PRE 09/02/2016 1.41    ??? FET100% Change 09/02/2016 15.32    ??? IC PRE 09/02/2016 4.05    ??? FRC PL PRE 09/02/2016 3.19    ??? RV PRE 09/02/2016 2.18    ??? TLC PRE 09/02/2016 7.1    ??? RV/TLC PRE 09/02/2016 30.67    ??? VC PRE 09/02/2016 4.92    ??? ERV PRE 09/02/2016 1.02*   ??? DLCO PRE 09/02/2016 24.58    ??? BHT POST 09/02/2016 11.23    ??? DLCO/VA POST 09/02/2016 3.88    ??? VA PRE 09/02/2016 6.35*   ??? IVC PRE 09/02/2016 4.54    ??? DL Adj PRE 78/46/9629 52.84    ??? DL/VA Adj PRE 13/24/4010 2.72    ??? FEV1 POST 09/02/2016 3.36    ??? FEV1/FVC POST 09/02/2016 66.22*   ??? FVC POST 09/02/2016 5.07    ??? PEF POST 09/02/2016 7.95    ??? VOL extrap post 09/02/2016 0.09    ??? FIVC POST 09/02/2016 3.51*   ??? PIF POST 09/02/2016 2.68*   ??? FIF50% POST 09/02/2016 2.13*   ??? FEV6 POST 09/02/2016 4.76    ??? FEV1/FEV6 POST 09/02/2016 70.6*   ??? FEF50% POST 09/02/2016 2.48    ??? FEF25-75% POST 09/02/2016 1.89*   ??? ZDG/UYQ03 post 09/02/2016 116.85    ??? ISOFEF25-75 POST 09/02/2016 2.46    ??? FET100% POST 09/02/2016 13.03    ??? FEV1 PRE 09/02/2016 2.97*   ??? FEV1/FVC PRE 09/02/2016 63.39*   ??? FVC PRE 09/02/2016 4.69    ??? PEF PRE 09/02/2016 8.82    ??? Vol extrap pre 09/02/2016 0.05    ??? FIVC PRE 09/02/2016 0.02*   ??? FEV6 PRE 09/02/2016 4.28    ??? FEV1/FEV6 PRE 09/02/2016 69.42*   ??? FEF50% PRE 09/02/2016 2    ??? FEF25-75% PRE 09/02/2016 1.41*   ??? ISOFEF25-75 PRE 09/02/2016 1.41    ??? FET100% Change 09/02/2016 15.32    ??? IC PRE 09/02/2016 4.05    ??? FRC PL PRE 09/02/2016 3.19    ??? RV PRE 09/02/2016 2.18    ??? TLC PRE 09/02/2016 7.1    ??? RV/TLC PRE 09/02/2016 30.67    ??? VC PRE 09/02/2016 4.92    ??? ERV PRE 09/02/2016 1.02*   ??? DLCO PRE 09/02/2016 24.58    ??? BHT POST 09/02/2016 11.23    ??? DLCO/VA POST 09/02/2016 3.88    ??? VA PRE 09/02/2016 6.35*   ???  IVC PRE 09/02/2016 4.54    ??? DL Adj PRE 16/11/9602 54.09    ??? DL/VA Adj PRE 81/19/1478 2.95    ??? FEV1 POST 09/02/2016 3.36    ??? FEV1/FVC POST 09/02/2016 66.22*   ??? FVC POST 09/02/2016 5.07    ??? PEF POST 09/02/2016 7.95    ??? VOL extrap post 09/02/2016 0.09    ??? FIVC POST 09/02/2016 3.51*   ??? PIF POST 09/02/2016 2.68*   ??? FIF50% POST 09/02/2016 2.13*   ??? FEV6 POST 09/02/2016 4.76    ??? FEV1/FEV6 POST 09/02/2016 70.6*   ??? FEF50% POST 09/02/2016 2.48    ??? FEF25-75% POST 09/02/2016 1.89*   ??? AOZ/HYQ65 post 09/02/2016 116.85    ??? ISOFEF25-75 POST 09/02/2016 2.46    ??? FET100% POST 09/02/2016 13.03    ??? FEV1 PRE 09/02/2016 2.97*   ??? FEV1/FVC PRE 09/02/2016 63.39*   ??? FVC PRE 09/02/2016 4.69    ??? PEF PRE 09/02/2016 8.82    ??? Vol extrap pre 09/02/2016 0.05    ??? FIVC PRE 09/02/2016 0.02*   ??? FEV6 PRE 09/02/2016 4.28    ??? FEV1/FEV6 PRE 09/02/2016 69.42*   ??? FEF50% PRE 09/02/2016 2    ??? FEF25-75% PRE 09/02/2016 1.41*   ??? ISOFEF25-75 PRE 09/02/2016 1.41    ??? FET100% Change 09/02/2016 15.32    ??? IC PRE 09/02/2016 4.05    ??? FRC PL PRE 09/02/2016 3.19    ??? RV PRE 09/02/2016 2.18    ??? TLC PRE 09/02/2016 7.1    ??? RV/TLC PRE 09/02/2016 30.67    ??? VC PRE 09/02/2016 4.92    ??? ERV PRE 09/02/2016 1.02*   ??? DLCO PRE 09/02/2016 24.58    ??? BHT POST 09/02/2016 11.23    ??? DLCO/VA POST 09/02/2016 3.88    ??? VA PRE 09/02/2016 6.35*   ??? IVC PRE 09/02/2016 4.54    ??? DL Adj PRE 78/46/9629 52.84    ??? DL/VA Adj PRE 13/24/4010 2.72

## 2016-12-09 NOTE — Unmapped (Signed)
ORTHOPAEDIC SPINE CLINIC NOTE       Nemiah Commander, MD  Associate Professor  [email or web address]  [phone]        Patient Name:Joseph Sandoval  MRN: [id number]  DOB: 1971-01-15    Date: 12/09/2016    PCP: [facility]    ASSESSMENT:      Joseph Sandoval  46 y.o. 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 Applying for disability.  Neuropathy. Afib. COPD. OSA. Bipolar. Tob. RA. HepC.  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 No c-radic.  Candidate for C5-7 anterior cervical decompression and instrumented fusion with cages/allograft (Pulm work-up pending. Rheum meds plan pending)      PLAN and RECOMMENDATIONS:      ?? Medications: Take over-the-counter medications as needed and as tolerated  ?? If you don't have liver disease, the safest medication for pain is acetaminophen (Tylenol).  Take 1000mg  (2 extra strength tabs) at a time for pain relief.  Do not take more than 3000mg  per day.  ?? Try Salonpas Lidocaine 4% patches (https://www.cook.com/)  ?? You are a candidate for surgery. (Anterior Cervical Decompression and Fusion-pending Pulmonary and Rheumatology Clearance.)  ?? See your Primary Care Provider for medical risk assessment for surgery  ?? We will message/fax a letter to your primary care provider for medical risk optimization/assessment  ?? Review the Spine Surgery Decision-Making Tool on our Facebook page [email or web address]) or via a direct Prezi link below.  ?? Anterior Cervical Fusion ... [email or web address]  ?? Imaging studies: none  ?? Follow-up: To be determined.  Please contact us via MyChart or phone if any problems arise.  ?? After medical clearance.  ?? We will be happy to schedule your surgery once you have been cleared by your doctors.     ?? Avoid nicotine/tobacco  ?? Minimize alcohol intake  ?? Maintain a healthy weight    ?? Stay physically active and exercise regularly as tolerated ([email or web address])  ?? Maintain good sleeping habits      Patient returns clinic after bilateral carpal tunnel releases. He has had no improvement of his bilateral numbness and tingling in his hands. He also feels he has increased clumsiness and worsening of his gait since last exam. Given these findings concerning for myelopathy he is a candidate for ACDF. We will contact both his pulmonary rheumatologist for medical clearance. We will see him for preoperative exam following clearance.      SUBJECTIVE:     Chief Complaint:  Joseph Sandoval is a 46 y.o. male with no improvement of symptoms following carpal tunnel release.    History of Present Illness:        12/09/16 0915   PainSc:   8     Duration/Since: 10+ years ago, worsening since Nov 2017  Location: neck pain, hand numbness and tingling b/l  Modifying factors/Context: worse with walking and holding walker  Associated symptoms: low back pain, n/t in feet  He continues to have numbness/tingling and pain in median nerve distribution.  Associated with weakness in grip and decreased hand coordination.Did not improve following carpal tunnel release.    Patient is recovering from alcoholism and has had n/t in hands and feet. He also has noted gate imbalance - he believes this has worsened over the last couple months.. Denies bowel or bladder issues.    Functional limitations: Unable to walk long  distances. Difficulty sleeping 2/2 to pain which wakes him at night.    Prior treatments: Medications     Medical History   Past Medical History:   Diagnosis Date   ??? Alcoholism (CMS-HCC)    ??? Anxiety    ??? Arthritis    ??? Asthma    ??? Atrial fibrillation (CMS-HCC)    ??? Bipolar affective (CMS-HCC)    ??? Bronchitis    ??? Depression    ??? Headache    ??? Hepatitis C    ??? Hyperlipidemia    ??? Hypertension    ??? Infectious viral hepatitis    ??? Joint pain    ??? Lung disease    ??? Polysubstance abuse (CMS-HCC)    ??? RA (rheumatoid arthritis) (CMS-HCC)    ??? Seizures (CMS-HCC)    ??? Shoulder injury    ??? Sleep apnea, obstructive    ??? Sleeping difficulties       Surgical History   He  has a past surgical history that includes Cardiac surgery; pr total hip arthroplasty (Right, 12/28/2015); pr wrist arthroscop,release xvers lig (Left, 03/21/2016); pr nervous system surgery unlisted (N/A, 03/21/2016); pr rad excis wrist synov/tendon,flexor (Left, 03/21/2016); pr revise median n/carpal tunnel surg (Left, 10/08/2016); and pr revise median n/carpal tunnel surg (Right, 10/22/2016).   Allergies   Lisinopril   Medications   Current Outpatient Prescriptions   Medication Sig Dispense Refill   ??? acetaminophen (TYLENOL) 500 MG tablet Take 1,000 mg by mouth every six (6) hours as needed for pain.     ??? albuterol (PROAIR HFA) 90 mcg/actuation inhaler Inhale 2 puffs every six (6) hours as needed for wheezing. 1 Inhaler 11   ??? amLODIPine (NORVASC) 5 MG tablet Take 10 mg by mouth daily.      ??? benztropine (COGENTIN) 0.5 MG tablet Take 0.5 mg by mouth Two (2) times a day.     ??? carBAMazepine (TEGRETOL) 200 mg tablet Take 100 mg by mouth Two (2) times a day.      ??? folic acid (FOLVITE) 1 MG tablet Take 1 tablet (1 mg total) by mouth daily. 90 tablet 3   ??? gabapentin (NEURONTIN) 400 MG capsule Take 400 mg by mouth Three (3) times a day.     ??? metoprolol tartrate (LOPRESSOR) 25 MG tablet Take 50 mg by mouth Two (2) times a day.      ??? mometasone-formoterol 200-5 mcg/actuation HFAA Inhale 2 puffs Two (2) times a day. 1 Inhaler 11   ??? ranitidine (ZANTAC) 150 MG tablet Take 150 mg by mouth Two (2) times a day.     ??? risperiDONE (RISPERDAL) 2 MG tablet Take 2 mg by mouth nightly.     ??? sertraline (ZOLOFT) 50 MG tablet Take 100 mg by mouth daily.   1   ??? simvastatin (ZOCOR) 20 MG tablet Take 20 mg by mouth nightly.     ??? syringe with needle (TUBERCULIN SYRINGE) 1 mL 25 gauge x 5/8 Syrg 1 Units by Miscellaneous route every seven (7) days. To be used with methotrexate 100 Syringe 3   ??? adalimumab (HUMIRA) 40 mg/0.8 mL injection Inject 0.8 mL (40 mg total) under the skin every fourteen (14) days. 1 each 2   ??? HYDROcodone-acetaminophen (NORCO) 5-325 mg per tablet Take 1 tablet by mouth every six (6) hours as needed for pain. for up to 5 doses (Patient not taking: Reported on 12/09/2016) 5 tablet 0   ??? HYDROcodone-acetaminophen (NORCO) 5-325 mg per tablet Take 1 tablet  by mouth every six (6) hours as needed for pain. for up to 7 doses (Patient not taking: Reported on 12/09/2016) 5 tablet 0   ??? hydroxychloroquine (PLAQUENIL) 200 mg tablet Take 1 tablet (200 mg total) by mouth Two (2) times a day. 60 tablet 6   ??? methotrexate 25 mg/mL injection solution Inject 1 mL (25 mg total) under the skin once a week. 4 mL 3     No current facility-administered medications for this visit.       Review of Systems   A 10-system review was performed by questionnaire and noted in the electronic chart.  Positives noted/discussed.  Balance of systems was negative.   Family History   His family history includes Arthritis in his father, mother, and sister; Cancer in his mother; Fibromyalgia in his paternal aunt; Heart attack in his mother; Heart disease in his father and mother; Hypertension in his father and mother; Migraines in his sister.     Social History   Social History     Social History Narrative   ??? No narrative on file     He  reports that he has been smoking Cigarettes.  He started smoking about 32 years ago. He has a 30.00 pack-year smoking history. He has quit using smokeless tobacco. His smokeless tobacco use included Snuff. He reports that he does not drink alcohol or use drugs.   The history recorded in the table above was reviewed.    OBJECTIVE:     PHYSICAL EXAM:  Vitals: BP 152/93  - Pulse 77  - Temp 36.4 ??C  - Ht 172.7 cm (5' 8)  - Wt (!) 116.9 kg (257 lb 12.8 oz)  - BMI 39.20 kg/m??     Appearance: well-nourished and no acute distress   Oriented to: time, place and person  Affect: alert and cooperative    Gait: slow  Coordination/Balance: unsteady tandem gait    Cervical Spine ROM: mildly restricted  Cervical Lymphadenopathy: none    Neck Palpation: normal skin, tender over cerical spine  Back Palpation: non-tender to palpation, normal skin    Cord/Nerve Tension Signs: Spurling's negative    Right Shoulder: No atrophy noted upon inspection. Normal skin. pain with external rotation  Left Shoulder: No atrophy noted upon inspection. Normal skin. pain with external rotation     Right Hand: No atrophy noted upon inspection. Normal ROM and stability. Normal skin. + tinels at carpal tunnel + phalen  Left Hand: Normal ROM and stability. Normal skin. well healed surgical incision + tinel and phalen    Right Hip/Knee:   No pain with hip internal rotation/loading. Normal internal rotation ROM.   No knee pain with flexion/extension. No atrophy noted upon inspection. Normal skin. Normal ROM and stability.  Left Hip/Knee:   No pain with hip internal rotation/loading. Normal internal rotation ROM.   No knee pain with flexion/extension. No atrophy noted upon inspection. Normal skin. Normal ROM and stability.    Extremities: No cyanosis or clubbing in bilat hands. and No edema in the feet.  Pulses: 2+ DP pulses bilaterally  2+ radial pulses bilaterally   Sensation: intact but decreased throughout bilateral hands and feet     Motor R L  Reflexes R L   Deltoid 4* 4*  Tricep 1+ 1+   Bicep 4* 4*  Bicep 2+ 2+   Tricep 5 5  Brachiorad 1+ 1+   WE 4* 4*       Grip 4- 4-  Patellar 2+ 2+  IO 5 5  Achilles 1+ 1+   IP 5 5       Quad 5 5  Pathologic R L   TA 4+* 4+*  Hoffmann's neg. neg.   EHL 3* 3*  Babinski neg. neg.   GS 5 5  Clonus neg. neg.   *Pain     MEDICAL DECISION MAKING      Discussion:  ?? Clinical findings, diagnostic/treatment options, and plan were discussed with the patient.  ?? Activities - Advised falls precautions (assistive device, rugs, lights, grab bars)  ?? Discussed his symptoms are likely multifactorial and surgery may not result in complete resolution of his hand paraesthesias. Patient understands this. Smoking cessation recommended and advised patient to call [place] [facility] (1-800-QUIT-NOW).     cc: [place]

## 2016-12-09 NOTE — Unmapped (Signed)
[id number]  Former Dietitian. Recov EtOH/58yrs. Applying for disability.  Neuropathy. Afib. COPD. OSA. Bipolar. Tob. RA. HepC.  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 No c-radic.  Candidate for C5-7 anterior cervical decompression and instrumented fusion with cages/allograft (Pulm work-up pending. Rheum meds plan pending)    Reports dysphagia to saliva every 2-3 days, lasting 2-3 min.    Discussed causes of numbness - neuropathy, carpal tunnel, neck.  Not likely to relieve all of his symptoms due to multi-factorial nature.  Discussed alternative of conserv mgmt  Discussed risk of surgery - paralysis, dysphagia, airway compromise/reintubation, non-union, further surgery.  He and his mother understand.    Will schedule after medical optz.

## 2016-12-10 LAB — HEPATITIS B SURFACE ANTIGEN: Hepatitis B virus surface Ag:PrThr:Pt:Ser:Ord:: NONREACTIVE

## 2016-12-10 LAB — HEPATITIS PANEL, ACUTE
HEPATITIS B SURFACE ANTIGEN: NONREACTIVE
HEPATITIS C ANTIBODY: REACTIVE — AB

## 2016-12-11 LAB — HCV RNA LOG10: Lab: 0

## 2016-12-11 LAB — HEPATITIS C RNA, QUANTITATIVE, PCR

## 2016-12-11 MED FILL — DULERA/200-5MCG/AERO: DULERA/200-5MCG/AERO | 30 days supply | Qty: 1 | Fill #0

## 2016-12-11 MED FILL — PROVENTIL HFA//AERS: PROVENTIL HFA//AERS | 25 days supply | Qty: 1 | Fill #0

## 2016-12-11 MED FILL — HYDROXYCHLOROQUINE SULFAT/200MG/TABS: HYDROXYCHLOROQUINE SULFAT/200MG/TABS | 30 days supply | Qty: 60 | Fill #0

## 2016-12-13 LAB — QUANTIFERON TB GOLD PLUS
Lab: NEGATIVE
QUANTIFERON ANTIGEN 1 MINUS NIL: -0.01 [IU]/mL

## 2016-12-13 LAB — HEPATITIS C RNA, QUANTITATIVE, PCR: HCV RNA: NOT DETECTED

## 2016-12-13 LAB — HCV RNA(IU): Hepatitis C virus RNA:ACnc:Pt:Ser/Plas:Qn:Probe.amp.tar: 0

## 2016-12-13 NOTE — Unmapped (Signed)
-----   Message from Charna Busman, MD sent at 12/09/2016 12:12 PM EDT -----  Regarding: RE: Neck surgery  I think he is fine from a pulmonary standpoint and relatively low risk.  My only rec would be to consider extubating him to Bipap or CPAP if possible given his history of OSA.  He just may need a few minutes of support as the anesthesia is clearing.      Will, if you have any other thoughts, feel free to share.    Ignacia Marvel   ----- Message -----  From: Nemiah Commander, MD  Sent: 12/09/2016  10:26 AM  To: Charna Busman, MD, Viviann Spare, MD, #  Subject: Neck surgery                                     Hi Drs. Jacqulyn Liner,    Mr.C is indicated for neck surgery.    Is he OK/optimal to proceed from Pulm perspective?  Any recs?    Thanks.  - ML  (Cell [phone] ... Feel free to call or text me if needed)

## 2016-12-28 MED ORDER — METHOTREXATE SODIUM 25 MG/ML INJECTION SOLUTION
SUBCUTANEOUS | 3 refills | 0.00000 days | Status: SS
Start: 2016-12-28 — End: 2017-04-01

## 2016-12-28 MED ORDER — METHOTREXATE SODIUM 25 MG/ML INJECTION SOLUTION: 25 mg | mL | 3 refills | 0 days | Status: SS

## 2016-12-28 MED ORDER — ADALIMUMAB 40 MG/0.8 ML SUBCUTANEOUS SYRINGE KIT
SUBCUTANEOUS | 2 refills | 0.00000 days | Status: SS
Start: 2016-12-28 — End: 2016-12-28

## 2016-12-28 MED ORDER — ADALIMUMAB 40 MG/0.8 ML SUBCUTANEOUS SYRINGE KIT: each | 2 refills | 0 days

## 2016-12-30 MED ORDER — FOLIC ACID 1 MG TABLET
ORAL_TABLET | Freq: Every day | ORAL | 3 refills | 0.00000 days | Status: SS
Start: 2016-12-30 — End: 2016-12-30

## 2016-12-30 MED ORDER — FOLIC ACID 1 MG TABLET: 1 mg | tablet | Freq: Every day | 3 refills | 0 days | Status: SS

## 2016-12-30 NOTE — Unmapped (Signed)
Script for Folic Acid sent to pharmacy on file on 12-30-2016.

## 2017-01-03 MED FILL — FOLIC ACID/1MG/TABS: FOLIC ACID/1MG/TABS | 90 days supply | Qty: 90 | Fill #2

## 2017-01-03 MED FILL — METHOTREXATE/25MG/ML/SOLN: METHOTREXATE/25MG/ML/SOLN | 84 days supply | Qty: 6 | Fill #0

## 2017-01-06 NOTE — Unmapped (Signed)
Per test claim for Humira, The requested prescription above will be made available through the manufacturer's free drug program and will be shipped directly to the patient via the manufacturer's contracted specialty pharmacy. A representative from the manufacturer program will reach out to the patient to schedule a delivery.  Patient can follow up with manufacturer if needed via phone# @ [phone]. Patient is required to call the manufacturer program to schedule delivery.  Please contact them via phone @ [phone].

## 2017-01-14 NOTE — Unmapped (Signed)
Humira approved through [facility].  Patient received his medication and started first injection yesterday.  He reports doing well aside from feeling tired yesterday.  He will need back and possibly hip surgery in the next few months.  Advised patient to inform surgeons that he is on Humira and will need to hold off on medication prior to surgery.  He verbalized understanding.    Chelsea Aus

## 2017-01-22 NOTE — Unmapped (Signed)
PT GOT A 3 MONTH SUPPLY FILLED  ON 11/23 WILL RESCHEDULE CALL.

## 2017-02-02 MED FILL — PROVENTIL HFA//AERS: PROVENTIL HFA//AERS | 25 days supply | Qty: 1 | Fill #1

## 2017-02-02 MED FILL — BD TB 1mL/25G 5/8 INCH/SYR: BD TB 1mL/25G 5/8 INCH/SYR | 84 days supply | Qty: 12 | Fill #3

## 2017-02-02 MED FILL — HYDROXYCHLOROQUINE SULFAT/200MG/TABS: HYDROXYCHLOROQUINE SULFAT/200MG/TABS | 30 days supply | Qty: 60 | Fill #1

## 2017-02-02 MED FILL — DULERA/200-5MCG/AERO: DULERA/200-5MCG/AERO | 90 days supply | Qty: 3 | Fill #1

## 2017-02-26 ENCOUNTER — Ambulatory Visit: Admit: 2017-02-26 | Discharge: 2017-02-26

## 2017-02-26 ENCOUNTER — Ambulatory Visit
Admit: 2017-02-26 | Discharge: 2017-02-26 | Attending: Adult Reconstructive Orthopaedic Surgery | Primary: Adult Reconstructive Orthopaedic Surgery

## 2017-02-26 ENCOUNTER — Ambulatory Visit: Admit: 2017-02-26 | Discharge: 2017-02-26 | Attending: Physician Assistant | Primary: Physician Assistant

## 2017-02-26 DIAGNOSIS — M545 Low back pain: Principal | ICD-10-CM

## 2017-02-26 DIAGNOSIS — Z96641 Presence of right artificial hip joint: Principal | ICD-10-CM

## 2017-03-03 NOTE — Unmapped (Signed)
Chief complaint/reason for visit: Eval right hip pain    History present illness: Patient is well-known to my practice.  He underwent a right hip replacement December 28, 2015.  He reports the surgery went well.  He has no pain in the proximal thigh or groin.  Complains of pain in the hip which he localizes in the low lumbosacral spine region.  He states it is been present for a couple years.  He denies sick injury.  It is present at rest and worse with prolonged walking or standing.  He denies radicular pain to the leg or foot.    Review of systems: No fevers or chills.  No new medical or musculoskeletal complaints other than noted in HPI    Physical examination: General: Alert no acute distress.  Extremities: Examination of patient's right hip reveals well-healed surgical incision.  There is no erythema.  There is no tenderness to proximal.  There is no pain with any motion.  Both flexion and abduction strength.  He is tender over the low lumbosacral spine.  5 EHL TA and GS muscle function with full sensation L1 through S1.  He walks with a slight antalgic gait.    Imaging: X-rays of the patient's right hip and pelvis reveal well-positioned components without loosening or failure    Assessment: Doing well status post right hip replacement with symptoms related to lumbosacral spine pain    Plan: I counseled the patient regarding the findings.  I reviewed and agree with him.  Provided reassurance that is flexion appropriate.  Discussed the anatomy of the bad hip that is her symptoms are likely localized over his lumbosacral spine and not in the hip region.  He was offered a referral to the spine center.  He will think it over but does not wish to proceed at this time.  I think with her aunt fibroids as directed.  He will follow-up in 2 years he should have x-rays of the right hip pelvis performed at that visit prior to being seen.

## 2017-03-11 ENCOUNTER — Ambulatory Visit: Admit: 2017-03-11 | Discharge: 2017-03-12 | Attending: Family | Primary: Family

## 2017-03-11 DIAGNOSIS — G4733 Obstructive sleep apnea (adult) (pediatric): Principal | ICD-10-CM

## 2017-03-11 NOTE — Unmapped (Addendum)
Sleep Apnea: Care Instructions  Your Care Instructions    Sleep apnea means that you frequently stop breathing for 10 seconds or longer during sleep. It can be mild to severe, based on the number of times an hour that you stop breathing or have slowed breathing.  Blocked or narrowed airways in your nose, mouth, or throat can cause sleep apnea. Your airway can become blocked when your throat muscles and tongue relax during sleep.  You can treat sleep apnea at home by making lifestyle changes. You also can use a CPAP breathing machine that keeps tissues in the throat from blocking your airway. Or your doctor may suggest that you use a breathing device while you sleep. It helps keep your airway open. This could be a device that you put in your mouth. In some cases, surgery may be needed to remove enlarged tissues in the throat.  Follow-up care is a key part of your treatment and safety. Be sure to make and go to all appointments, and call your doctor if you are having problems. It's also a good idea to know your test results and keep a list of the medicines you take.  How can you care for yourself at home?  ?? Lose weight, if needed. It may reduce the number of times you stop breathing or have slowed breathing.  ?? Sleep on your side. It may stop mild apnea. If you tend to roll onto your back, sew a pocket in the back of your pajama top. Put a tennis ball into the pocket, and stitch the pocket shut. This will help keep you from sleeping on your back.  ?? Avoid alcohol and medicines such as sleeping pills and sedatives before bed.  ?? Do not smoke. Smoking can make sleep apnea worse. If you need help quitting, talk to your doctor about stop-smoking programs and medicines. These can increase your chances of quitting for good.  ?? Prop up the head of your bed 4 to 6 inches by putting bricks under the legs of the bed.  ?? Treat breathing problems, such as a stuffy nose, caused by a cold or allergies.  ?? Try a continuous positive airway pressure (CPAP) breathing machine if your doctor recommends it. The machine keeps your airway open when you sleep.  ?? If CPAP does not work for you, ask your doctor if you can try other breathing machines. A bilevel positive airway pressure machine uses one type of air pressure for breathing in and another type for breathing out. Another device raises or lowers air pressure as needed while you breathe.  ?? Talk to your doctor if:  ? Your nose feels dry or bleeds when you use one of these machines. You may need to increase moisture in the air. A humidifier may help.  ? Your nose is runny or stuffy from using a breathing machine. Decongestants or a corticosteroid nasal spray may help.  ? You are sleepy during the day and it gets in the way of the normal things you do. Do not drive when you are drowsy.  When should you call for help?  Watch closely for changes in your health, and be sure to contact your doctor if:  ?? ?? You still have sleep apnea even though you have made lifestyle changes.   ?? ?? You are thinking of trying a device such as CPAP.   ?? ?? You are having problems using a CPAP or similar machine.   Where can you learn more?  Go to [facility] at [email or web address]  Select Preferences in the upper right hand corner, then select Health Library under Resources. Enter [phone] in the search box to learn more about Sleep Apnea: Care Instructions.  Current as of: October 16, 2016  Content Version: 11.9  ?? 2006-2018 [employer]. Care instructions adapted under license by [facility]. If you have questions about a medical condition or this instruction, always ask your healthcare professional. [employer] disclaims any warranty or liability for your use of this information.    POSSIBLE PROBLEMS AND SOLUTIONS FOR PATIENTS WITH CPAP     You may have some problems arise with the use of your CPAP.  We have collected some of the common remedies for common problems related to the CPAP.  These are things you can do to decrease or resolve some of the problems which may occur with CPAP.  These are only suggestions and you  may have your own solutions to these problems.      1. Dry Eyes: Dry eye may occur when your mask leaks air or you may have air flow through a small    duct that drains your tears.    Solution:   1. Make sure your mask makes a tight seal around your face.   Sometimes this requires loosening the mask straps to eliminate the folds in the inner membrane        2. Use artificial tears eye drops or eye gel in each eye before    going to bed and after waking up.    2. Dry Nose: One of the functions of the nose is to increase the amount of water in the air before going to your lungs.  When large volumes of air pass through your nose your nose dries out and may become very irritated.  You may also note a sore throat.      Solution:  1. Your machine should have a heated humidifier which increases the water content of  the air from your CPAP machine.  If you do not have this you need to get one from your home heath company.  Be sure to change the water as instructed, and use only distilled water.  2.  Nasal salt solution sprays (Ocean Spray) four times per day.  Do not use medicated decongestants; These will only make your nose worse.  3. Sometimes applying Vaseline to the inside rim of your nose may reduce the soreness.  Be careful not to get any petroleum based products on the mask since the mask may breakdown.    4. For people who forced air heat, you may wish to get a humidifier for your house to increase the water in the air of your house during the winter.     5. Heated humidifiers can deliver extra moisture.      3. Stuffy Nose: A stuffy nose may come from several different reasons.  The stuffiness may indicated    that the tissue inside the nose has become swollen.  This may occur when the nose is to    dry, when you have an allergy or when you have a cold.       Solution:  1. Make sure the air intake of CPAP machine is clean not near a dusty or moldy      environment.  2.  Get a humidifier for your CPAP machine.  3.  Change the filter on your CPAP machine  4.  Change the humidifier water  and clean the humidifier and tubing , rinse with distilled water.  5.  Nasal salt sprays four times per day.  6.  If you have a cold you may wish to use a over the counter decongestant for a short period but most of these lose their effect when used for over a week.  7.  If the stuffiness persists, you  should consult your doctor.    4. Dry Mouth: Some patients will note a dry mouth when they awaken.  This comes from having your    mouth open at night.  When your mouth is open the air being pushed in through your    nose is coming out your mouth and not holding your throat open.  So unfortunately, your   are not getting the benefit of the CPAP and your getting a dry mouth.     Solution:      1.  Call your home health company and request they send to you a chin strap  for your head gear or use a chin bra.     2.  Football teeth guards or protectors may also help.  Get the type that you can  mold to fit both your top and bottom teeth.  There should be no hole in the  middle to breath through .   3.  Use the heated humidifier with your CPAP machine.   4.  Tape (2 inch) across your lips try medical paper tape or transpore tape.      5. Skin Irritation:  You may notice rub marks from your mask or head gear.  This may particularly occur on the bridge of your nose.  This comes from the mask being tight or the head gear being tight.  From some people requiring high CPAP pressures the tight head gear may be  unavoidable.  Sometimes skin is irritated because of the mask not being cleaned well or soap left on the mask.     Solution:      1.  Skin protectors or patches like those sold for tender areas on    your feet may be placed on the head gear or mask to reduce the    pressure on the irritatedarea.  2.  Make sure to use a skin moisturizer to keep the skin from cracking or peeling.  3.  Clean the mask and head gear regularly making sure that all of the soap is rinsed off thoroughly with distilled water.  4. Foam spacers may provide some relief of pressure to the skin. You can use such as the foam donuts for foot blisters or sores on the forehead portion of your gear  5.  Use a softer mask or gel mask.  6.  Change mask or alternate masks    6.  Face Mask Leaking:    You may note leaking of air from around the mask, or difficulty getting a good seal around the mask.  This happens when masks do not fit or the head gear does not hold the mask on tight.     Solution: 1. Tighten  or loosen head gear to get a good seal with the mask.  2. Make sure your masks fit properly and is comfortable.  If not ask your home health company to refit you with another mask.  3. Use a facial moisturizer to help seal the mask wear it touches your face  4. Once you find a comfortable position for you mask that provides a  good seal mark your head gear strap position with colored thread.  This will remind you how tight  your straps need to go to get a good seal.  These may change over time.    7. Tube Pulling on the Mask:    The tubes from the CPAP machine to the mask can pull the mask and break the seal when you move from side to side.  Some people also find they can not get comfortable with the tubes on the bed.      Solution:   1.  Have your tubing either be above your head or come along your chest which ever feels more comfortable.  2.  Buy an extension arm light that swivels at the base and can be clamped or  attached to the head of your bed.  You should take the light off of the extension arm, attach the extension arm to the head of your bed over your pillow so that it can rotate from the left to right side of your bed.  Attach one end of a  flexible stretch cord to the end of the extension arm and the other end of the cord around the CPAP tubing.  Be sure to have enough slack to allow you free motion from side to side without having the tubing resting on the bed.

## 2017-03-11 NOTE — Unmapped (Signed)
Sleep Medicine Clinic    New Sleep Clinic Visit:    [facility] [address]  [facility] NEUROLOGY CLINIC [place] [address] [facility] [place]  [address]  [place] [address]  [phone]    Date: March 11, 2017   Patient Name: Joseph Sandoval   MRN: [id number]   PCP: [facility]  Referring Provider: Dena Billet, MD    PROVIDER: Leonidas Romberg, FNP       Patient is seen in consultation as requested by Dr.  Dena Billet, MD for evaluation and opinion on treatment of sleep apnea.      History of Present Illness: Patient is a 47 year old male with history of atrial fibrillation, hypertension, alcohol abuse, peripheral neuropathy, asthma, depression, anxiety, headaches, seizures, osteoarthritis, hyperlipidemia, rheumatoid arthritis, bipolar affective disorder, and sleep apnea.  Patient was referred by [facility] pulmonology.  Patient reports he had a sleep study done at [facility] sleep center.  At sleep study was done November 26, 2015 and revealed moderate sleep apnea with an overall AHI of 25.6.  Patient was titrated to a CPAP pressure of 7 cm of water and initiated CPAP therapy.  He reports only being able to use it for approximately 2 months.  He would go to sleep okay with the CPAP on, however, he would wake up and the full face mask would not be on his face.  He got frustrated and stopped using CPAP therapy altogether.  He reports not being able to go to sleep or stay asleep.  He goes to bed around 7 PM and sleeps to approximately 4 AM.  His Epworth scale is 20.  He does admit that his pain from his rheumatoid arthritis is bad, therefore affecting his sleep.  He does take gabapentin as prescribed by his primary care provider he does snore and has witnessed pauses in his breathing as well as shortness of breath.  He describes vivid nightmares, occasional REM behavior as well as seeing witches on the ceiling.  He denies sleep cataplexy and sleep paralysis.  He does wake up most mornings with headaches.  He does note nasal congestion and uses nasal sprays.  He denies GERD.  Patient here today to discuss options for making CPAP therapy more tolerable.      Medical history.  Past Medical History:   Diagnosis Date   ??? Alcoholism (CMS-HCC)    ??? Anxiety    ??? Arthritis    ??? Asthma    ??? Atrial fibrillation (CMS-HCC)    ??? Bipolar affective (CMS-HCC)    ??? Bronchitis    ??? Depression    ??? Headache    ??? Hepatitis C    ??? Hyperlipidemia    ??? Hypertension    ??? Infectious viral hepatitis    ??? Joint pain    ??? Lung disease    ??? Polysubstance abuse (CMS-HCC)    ??? RA (rheumatoid arthritis) (CMS-HCC)    ??? Seizures (CMS-HCC)    ??? Shoulder injury    ??? Sleep apnea, obstructive    ??? Sleeping difficulties        Past Surgical Hx:  Past Surgical History:   Procedure Laterality Date   ??? CARDIAC SURGERY      2008 shock therapy   ??? PR NERVOUS SYSTEM SURGERY UNLISTED N/A 03/21/2016    Procedure: UNLISTED PROC NERVOUS SYSTEM;  Surgeon: Coralee Pesa, MD;  Location: [facility];  Service: Orthopedics   ??? PR  RAD EXCIS WRIST SYNOV/TENDON,FLEXOR Left 03/21/2016    Procedure: RAD EXC BURSA WRIST TENDON SHEATHS; FLEXORS;  Surgeon: Coralee Pesa, MD;  Location: [facility];  Service: Orthopedics   ??? PR REVISE MEDIAN N/CARPAL TUNNEL SURG Left 10/08/2016    Procedure: NEUROPLASTY AND/OR TRANSPOSITION; MEDIAN NERVE AT CARPAL TUNNEL--MOD 22--REVISION;  Surgeon: Garnett Farm, MD;  [facility];  Service: Ortho Hand   ??? PR REVISE MEDIAN N/CARPAL TUNNEL SURG Right 10/22/2016    Procedure: NEUROPLASTY AND/OR TRANSPOSITION; MEDIAN NERVE AT CARPAL TUNNEL;  Surgeon: Garnett Farm, MD;  [facility];  Service: Ortho Hand   ??? PR TOTAL HIP ARTHROPLASTY Right 12/28/2015    Procedure: ARTHROPLASTY, ACETABULAR & PROXIMAL FEMORAL PROSTHETIC REPLACEMENT (TOTAL HIP), W/WO AUTOGRAFT/ALLOGRAFT;  Surgeon: Malka So, MD;  Location: [facility] [place] [facility];  Service: Orthopedics   ??? PR WRIST ARTHROSCOP,RELEASE XVERS LIG Left 03/21/2016    Procedure: LEFT ENDOSCOPIC CARPAL TUNNEL RELEASE;  Surgeon: Coralee Pesa, MD;  Location: [facility];  Service: Orthopedics       Social Hx:  Social History     Social History   ??? Marital status: Single     Spouse name: N/A   ??? Number of children: N/A   ??? Years of education: N/A     Social History Main Topics   ??? Smoking status: Current Every Day Smoker     Packs/day: 1.00     Years: 30.00     Types: Cigarettes     Start date: 05/19/1984   ??? Smokeless tobacco: Former Neurosurgeon     Types: Snuff   ??? Alcohol use No      Comment: recovering   ??? Drug use: No   ??? Sexual activity: Not Asked     Other Topics Concern   ??? None     Social History Narrative   ??? None     Job: Disability  Caffeine: 3 servings of Mountain Dew daily  ETOH: No alcohol since June 2015    Family Hx:  Family History   Problem Relation Age of Onset   ??? Heart attack Mother    ??? Hypertension Mother    ??? Arthritis Mother    ??? Cancer Mother    ??? Heart disease Mother    ??? Hypertension Father    ??? Arthritis Father    ??? Heart disease Father    ??? Arthritis Sister    ??? Migraines Sister    ??? Fibromyalgia Paternal Aunt        ALLERGIES:  Allergies   Allergen Reactions   ??? Lisinopril Swelling     swollen lips       CURRENT MEDICATIONS:    Current Outpatient Prescriptions:   ???  acetaminophen (TYLENOL) 500 MG tablet, Take 1,000 mg by mouth every six (6) hours as needed for pain., Disp: , Rfl:   ???  adalimumab (HUMIRA) 40 mg/0.8 mL injection, Inject 0.8 mL (40 mg total) under the skin every fourteen (14) days., Disp: 1 each, Rfl: 2  ???  albuterol (PROAIR HFA) 90 mcg/actuation inhaler, Inhale 2 puffs every six (6) hours as needed for wheezing., Disp: 1 Inhaler, Rfl: 11  ???  amLODIPine (NORVASC) 5 MG tablet, Take 10 mg by mouth daily. , Disp: , Rfl:   ???  benztropine (COGENTIN) 0.5 MG tablet, Take 0.5 mg by mouth Two (2) times a day., Disp: , Rfl:   ???  carBAMazepine (TEGRETOL) 200 mg tablet, Take  100 mg by mouth Two (2) times a day. , Disp: , Rfl:   ???  folic acid (FOLVITE) 1 MG tablet, Take 1 tablet (1 mg total) by mouth daily., Disp: 90 tablet, Rfl: 3  ???  gabapentin (NEURONTIN) 400 MG capsule, Take 400 mg by mouth Three (3) times a day., Disp: , Rfl:   ???  hydroCHLOROthiazide (HYDRODIURIL) 25 MG tablet, Take 25 mg by mouth daily., Disp: , Rfl: 4  ???  hydroxychloroquine (PLAQUENIL) 200 mg tablet, Take 1 tablet (200 mg total) by mouth Two (2) times a day., Disp: 60 tablet, Rfl: 6  ???  methotrexate 25 mg/mL injection solution, Inject 1 mL (25 mg total) under the skin once a week., Disp: 4 mL, Rfl: 3  ???  metoprolol tartrate (LOPRESSOR) 25 MG tablet, Take 50 mg by mouth Two (2) times a day. , Disp: , Rfl:   ???  mometasone-formoterol 200-5 mcg/actuation HFAA, Inhale 2 puffs Two (2) times a day., Disp: 1 Inhaler, Rfl: 11  ???  ranitidine (ZANTAC) 150 MG tablet, Take 150 mg by mouth Two (2) times a day., Disp: , Rfl:   ???  risperiDONE (RISPERDAL) 2 MG tablet, Take 2 mg by mouth nightly., Disp: , Rfl:   ???  sertraline (ZOLOFT) 50 MG tablet, Take 100 mg by mouth daily. , Disp: , Rfl: 1  ???  simvastatin (ZOCOR) 20 MG tablet, Take 20 mg by mouth nightly., Disp: , Rfl:   ???  syringe with needle (TUBERCULIN SYRINGE) 1 mL 25 gauge x 5/8 Syrg, 1 Units by Miscellaneous route every seven (7) days. To be used with methotrexate, Disp: 100 Syringe, Rfl: 3    Review of Systems:  A 14-systems review was performed and, unless otherwise noted, declared negative except reported in history of present illness    PHYSICAL EXAM: Patient is in no acute distress, BP 127/75 (BP Site: L Arm, BP Position: Sitting, BP Cuff Size: Large)  - Pulse 77  - Ht 172.7 cm (5' 7.99)  - Wt (!) 122.4 kg (269 lb 14.4 oz)  - BMI 41.05 kg/m??  eyes clear, noninjected, nose without discharge, remainder of exam deferred due to the counseling nature of the visit    LABS and DATA reviewed: Referral notes reviewed, sleep study reviewed from [facility] on November 26, 2015    ASSESSMENT and IMPRESSION: Patient is a 47 year old male with history of atrial fibrillation, hypertension, alcohol abuse, hyperlipidemia, rheumatoid arthritis, chronic pain, anxiety, depression, bipolar disorder and sleep apnea.  Patient diagnosed with moderate sleep apnea and prescribed CPAP therapy at 7 cm of water.  Patient having difficulty tolerating his CPAP mask.    Plan:    1.  Sleep apnea.  Reviewed sleep study with patient.  Reviewed signs and symptoms of sleep apnea with patient.  Counseled him on what sleep apnea is and risk factors for untreated sleep apnea and the benefit of CPAP therapy.  Discussed several options for treatment of sleep apnea.  Patient having difficulty with CPAP therapy due to mask discomfort.  Recommend a new mask fitting, such as nasal mask or nasal pillows.  Prescription given to patient to take to his previous DME for a new mask fitting.  Consider future titration if patient continues to experience discomfort to CPAP therapy.  Current setting is 7 cm of water.  Patient will follow-up as needed.    60 minutes face to face time spent with patient of which greater than half of the time was  spent with counseling and education that reviewed the diagnosis, assessment and treatment options for sleep apnea.    *Patient note was created using dragon dictation. Any errors in syntax or proofreading may not have been identified and edited on initial review prior to signing this note.

## 2017-03-17 NOTE — Unmapped (Addendum)
?? Your diagnosis: Cervical stenosis     ?? Recommendations/Plan  ?? Hold your Methotrexate and Humira 2 weeks prior to your surgery (starting 03/17/17) as well as two weeks after your surgery (re-start 04/14/17)  ?? Medications: Take over-the-counter medications as needed and as tolerated  ?? If you don't have liver disease, the safest medication for pain is acetaminophen (Tylenol).  Take 1000mg  (2 extra strength tabs) at a time for pain relief.  Do not take more than 3000mg  per day.  ?? Try Salonpas Lidocaine 4% patches (https://www.cook.com/)  ?? We have prescribed bactroban nasal ointment  ?? Activities as tolerated.  ?? You are a candidate for surgery. (C5-7 Anterior Cervical Decompression and Fusion)  ?? Avoid blood thinners (such as fish oil, ibuprofen, Motrin, Advil, Aleve, aspirin, Goody powder, BC powder, Eliquis, Plavix, and warfarin) for 7-10 days before surgery  ?? Surgery has been scheduled for 03/31/2017 at [facility]  ?? Review the Spine Surgery Decision-Making Tool on our Facebook page ([email or web address]) or via a direct Prezi link below.  ?? Anterior Cervical Fusion ... [email or web address]  ?? You need to get labs drawn today.  ?? To prevent falls, remove small loose rugs, keep hallway lights on, and install grab bars.  ?? Contact our Artist at [phone].  ?? Imaging studies: Cervical AP and lateral views (prior to next visit).  ?? Follow-up: At 10-14 days after surgery.     ?? Avoid nicotine/tobacco  ?? Minimize alcohol intake  ?? Maintain a healthy weight    ?? Stay physically active and exercise regularly as tolerated ([email or web address])  ?? Maintain good sleeping habits    ?? We want to improve, and you can help.  You may receive a survey asking you about your visit.  Please take a few minutes to complete the survey.  We will use your feedback to make improvements.    ?? Contact our team electronically via MyChart message to [facility] Clinical Staff.    ?? Contact our team at [phone] or [phone] with any questions/concerns.    ?? Please visit and like [place] on our Facebook page to learn more about our services.   [email or web address]    PRE-SURGICAL INFORMATION & CHECKLIST    THE WEEK BEFORE YOUR SURGERY    1. You will bleed less during surgery if you stop taking anti-inflammatory medications at least one week before surgery.  These include ASPIRIN, IBUPROFEN (Motrin, Advil), NAPROXYN (Naprosyn), NAPROXEN SODIUM (Aleve), INDOMETHACIN (Indocin), CELEBREX and several other prescription anti-inflammatories commonly prescribed for arthritis or other musculoskeletal conditions. Discontinue FISH OIL supplements as well.  Ask your doctor before discontinuing any medication.   2. If you normally shave the area of surgery, please stop shaving one week (or more) before surgery. Germs grow in small razor nicks from shaving and will increase the risk of infection.     THE LAST BUSINESS DAY BEFORE YOUR SURGERY    1. Surgery times are given out from 2:00-5:00 p.m. on the last business day before scheduled surgery.  A preoperative nurse will call you during this time to confirm when you should report for your surgery.  If you have not been contacted by 5:00 p.m., you may call our pre-care department: [facility] at [phone]; [facility] [place] at [phone].  2. If you will not be staying at your home address the night before surgery, please call the Ambulatory Procedure Center to let them know where you can be reached.     THE  NIGHT BEFORE YOUR SURGERY    1. DO NOT EAT OR DRINK ANYTHING AFTER MIDNIGHT, unless instructed otherwise.  2. Take your usual medications as prescribed, unless instructed otherwise.   3. Shower with antibacterial soap the night before and the morning of surgery.     THE MORNING OF YOUR SURGERY     1. Take a shower or bath and wash the surgical area with the antibacterial soap you have been given in the clinic.   2. Remember NOT to bring your TED hose to the hospital. These will be needed at home after surgery.    3. Remember to bring any crutches, braces or other equipment that have been supplied for use immediately following your surgery.   4. You may take your usual medications as prescribed with a small sip of water, unless instructed otherwise.  Do not drink a full glass of water.      A RESPONSIBLE ADULT (OVER 18) MUST ACCOMPANY YOU ON THE DAY OF SURGERY AND BE AVAILABLE THROUGHOUT YOUR PROCEDURE IN THE EVENT THERE ARE QUESTIONS OR COMPLICATIONS.  You must have an adult available to take you home following your procedure as it will not be safe for you to drive or take public transportation alone.

## 2017-03-18 MED ORDER — MUPIROCIN 2 % TOPICAL OINTMENT: g | 0 refills | 0 days

## 2017-03-18 MED ORDER — MUPIROCIN 2 % TOPICAL OINTMENT
Freq: Two times a day (BID) | TOPICAL | 0 refills | 0.00000 days | Status: CP
Start: 2017-03-18 — End: 2017-03-18

## 2017-03-18 MED ORDER — CHLORHEXIDINE GLUCONATE 4 % TOPICAL LIQUID
Freq: Two times a day (BID) | TOPICAL | 0 refills | 0.00000 days | Status: CP
Start: 2017-03-18 — End: 2017-03-21

## 2017-03-18 NOTE — Unmapped (Signed)
ORTHOPAEDIC SPINE SERVICE       Nemiah Commander, MD  Associate Professor  [email or web address]  [phone]        Patient Name:Joseph Sandoval  MRN: [id number]  DOB: [birth year]    Date: 03/19/2017    PCP: [facility]    ASSESSMENT:     Indications for Surgery:  Joseph Sandoval is a 47 y.o. male indicated for surgical intervention.  [phone]  Former Dietitian. Recov EtOH/35yrs. Applying for disability.  Neuropathy. Afib. COPD. OSA. Bipolar. Tob. RA. HepC.  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 No c-radic.  Candidate for C5-7 anterior cervical decompression and instrumented fusion with cages/allograft (Monday 2/18 at [facility])    (Pulm cleared .[name] Extubate to BiPAP/CPAP)  (Dr.Jaleel ... stop his MTX 2 weeks before and 2 weeks after as well as stop the humira 2 weeks before and 2 weeks after).    Significant/relevant symptoms: neck pain, hand numbness and tingling b/l (L > R)  Duration/Since:10+ years ago, worsening since Nov 2017  Daily living & functional limitations: Unable to walk long distances. Difficulty sleeping 2/2 to pain which wakes him at night.Difficulty driving long distances. Inability to complete ADL's  Prior conservative treatments: Bilateral Carpal tunnel release physical therapy for 4 sessions without relief & worsening of symptoms and pain medications   Significant co-morbidities: afib (well controlled), seizures (cant remember last time he had one, controlled with carbamazepime) bipolar, hep C, COPD/OSA, RA, prior ETOH abuse, neuropathy, dysphagia (difficulty swallowing, variable in onset, only with secretions, able to tolerate food and drink ok)   Physical exam: dec muscle strength as below, dec sensation  Medical risk assessment: Patient has been medically cleared for surgery by PCP. Patient has been medically cleared for surgery by Pulmonologist & Rheumatologist.  Discussion: Risks/benefits and alternatives of surgery were discussed with the patient.  Risks/benefits of alternatives were also discussed. After further evaluation and discussion today, the patient understands risks/benefits/alternatives and has requested surgical treatment.      PLAN:     ?? Surgery scheduled: Wed 03/31/17  ?? Pre-Operative Readiness/Evaluation(s):Cleared by Pulmonology with recommendations to extubate to CPAP/BiPAP, Cleared by Rheumatology with recommendations to stop his MTX & Humira 2 weeks before and 2 weeks after.   ?? Post-op follow-up: 10-14 days after surgery   Radiographs prior to next visit: Cervical AP and lateral views (prior to next visit).     SUBJECTIVE:     Joseph Sandoval returns today for further evaluation and treatment of his chief complaint of Neck Pain and bilateral hand numbness, tingling and clumsiness persistent despite bilateral carpal tunnel release.    History of Present Illness:        03/19/17 4259   PainSc:   8       Duration/Since: 10+ years ago, worsening since Nov 2017  Location: neck pain, hand numbness and tingling b/l  Modifying factors/Context: worse with walking and holding walker  Associated symptoms: low back pain, n/t in feet  He continues to have numbness/tingling and pain in median nerve distribution.  Associated with weakness in grip and decreased hand coordination.Did not improve following bilateral carpal tunnel release.  ??  Patient is recovering from alcoholism and has had n/t in hands and feet. He also has noted gate imbalance - he believes this has worsened over the last couple months.. Denies bowel or bladder issues.  Medical History   Past Medical History:   Diagnosis Date   ??? Alcoholism (CMS-HCC)    ??? Anxiety    ??? Arthritis    ??? Asthma    ??? Atrial fibrillation (CMS-HCC)    ??? Bipolar affective (CMS-HCC)    ??? Bronchitis    ??? Depression    ??? Headache    ??? Hepatitis C    ??? Hyperlipidemia    ??? Hypertension    ??? Infectious viral hepatitis    ??? Joint pain    ??? Lung disease    ??? Polysubstance abuse (CMS-HCC)    ??? RA (rheumatoid arthritis) (CMS-HCC)    ??? Seizures (CMS-HCC)    ??? Shoulder injury    ??? Sleep apnea, obstructive    ??? Sleeping difficulties       Surgical History   He  has a past surgical history that includes Cardiac surgery; pr total hip arthroplasty (Right, 12/28/2015); pr wrist arthroscop,release xvers lig (Left, 03/21/2016); pr nervous system surgery unlisted (N/A, 03/21/2016); pr rad excis wrist synov/tendon,flexor (Left, 03/21/2016); pr revise median n/carpal tunnel surg (Left, 10/08/2016); and pr revise median n/carpal tunnel surg (Right, 10/22/2016).   Allergies   Lisinopril   Medications   Current Outpatient Prescriptions   Medication Sig Dispense Refill   ??? acetaminophen (TYLENOL) 500 MG tablet Take 1,000 mg by mouth every six (6) hours as needed for pain.     ??? adalimumab (HUMIRA) 40 mg/0.8 mL injection Inject 0.8 mL (40 mg total) under the skin every fourteen (14) days. 1 each 2   ??? albuterol (PROAIR HFA) 90 mcg/actuation inhaler Inhale 2 puffs every six (6) hours as needed for wheezing. 1 Inhaler 11   ??? amLODIPine (NORVASC) 5 MG tablet Take 10 mg by mouth daily.      ??? benztropine (COGENTIN) 0.5 MG tablet Take 0.5 mg by mouth Two (2) times a day.     ??? carBAMazepine (TEGRETOL) 200 mg tablet Take 100 mg by mouth Two (2) times a day.      ??? chlorhexidine (HIBICLENS) 4 % external liquid Apply 1 application topically Two (2) times a day. for 3 days At incision site for 3 days prior and morning of surgery. 236 mL 0   ??? folic acid (FOLVITE) 1 MG tablet Take 1 tablet (1 mg total) by mouth daily. 90 tablet 3   ??? gabapentin (NEURONTIN) 400 MG capsule Take 400 mg by mouth Three (3) times a day.     ??? hydroCHLOROthiazide (HYDRODIURIL) 25 MG tablet Take 25 mg by mouth daily.  4   ??? hydroxychloroquine (PLAQUENIL) 200 mg tablet Take 1 tablet (200 mg total) by mouth Two (2) times a day. 60 tablet 6   ??? methotrexate 25 mg/mL injection solution Inject 1 mL (25 mg total) under the skin once a week. 4 mL 3   ??? metoprolol tartrate (LOPRESSOR) 25 MG tablet Take 50 mg by mouth Two (2) times a day.      ??? mometasone-formoterol 200-5 mcg/actuation HFAA Inhale 2 puffs Two (2) times a day. 1 Inhaler 11   ??? mupirocin (BACTROBAN) 2 % ointment Apply topically Two (2) times a day. for 3 days Apply to both nares and gently massage. Begin 3 days prior to surgery. 15 g 0   ??? ranitidine (ZANTAC) 150 MG tablet Take 150 mg by mouth Two (2) times a day.     ??? risperiDONE (RISPERDAL) 2 MG tablet Take 2 mg by mouth nightly.     ???  sertraline (ZOLOFT) 50 MG tablet Take 100 mg by mouth daily.   1   ??? simvastatin (ZOCOR) 20 MG tablet Take 20 mg by mouth nightly.     ??? syringe with needle (TUBERCULIN SYRINGE) 1 mL 25 gauge x 5/8 Syrg 1 Units by Miscellaneous route every seven (7) days. To be used with methotrexate 100 Syringe 3     No current facility-administered medications for this visit.       Review of Systems   A 10-system review was performed by questionnaire and noted in the electronic chart.  Positives noted/discussed.  Balance of systems was negative.   Family History   Bleeding or blood clotting disorders: denies  Problems with anesthesia: denies     Social History   Social History     Social History Narrative   ??? No narrative on file     He  reports that he has been smoking Cigarettes.  He started smoking about 32 years ago. He has a 30.00 pack-year smoking history. He has quit using smokeless tobacco. His smokeless tobacco use included Snuff. He reports that he does not drink alcohol or use drugs.   The history recorded in the table above was reviewed.    OBJECTIVE:     PHYSICAL EXAM:  Vitals: Wt (!) 121.4 kg (267 lb 9.6 oz)  - BMI 40.70 kg/m??     Appearance: well-nourished and no acute distress   Affect: alert and cooperative  Gait: antalgic  Straight Line Tandem Gait: tandem gait unable due to unsteadiness    Sensation: decreased senation in bilateral upper extremity, decreased sensation in bilateral SPN/DPN LE       Motor R L ?? Reflexes R L   Deltoid 4* 4* ?? Tricep 1+ 1+   Bicep 4* 4* ?? Bicep 1+ 1+   Tricep 4 4 ?? Brachiorad 1+ 1+   WE 4-* 4-* ?? ?? ?? ??   Grip 4- 4- ?? Patellar 1+ 1+   IO 5 5 ?? Achilles 1+ 1+   IP 5 5 ?? ?? ?? ??   Quad 5 5 ?? Pathologic R L   TA 4+* 4+* ?? Hoffmann's neg. neg.   EHL 3* 3* ?? Babinski neg. neg.   GS 4* 4* ?? Clonus neg. neg.   *Pain     MEDICAL DECISION MAKING    Test Results:  Imaging:   ?? See above Indications for Surgery    Discussion:  We discussed natural history of the problem, alternatives to surgical treatment, surgical options, risks/benefits of surgery, hospitalization, post-operative course and reviewed the patient-centered surgery decision-making presentation.      Discussion of benefits of surgery included: relief of neurogenic arm symptoms, prevention of further nerve injury, preservation of current function (ability to walk, use hands/arms/legs), neurologic improvement and functional improvement.    Patient understands that surgical treatment DOES NOT reliably relieve chronic axial neck pain.    Discussion of the risks of surgery included: paralysis from spinal cord injury, nerve injury, trouble swallowing, hoarse voice, adjacent segment disease, failure of bone to heal, need for further surgery, infection, bleeding and re-intubation, injury to esophagus and arteries and blood clots, heart attack, stroke, and death..    Patient understands that he is specifically at elevated risks for nerve injury, trouble swallowing, hoarse voice, adjacent segment disease, failure of bone to heal, need for further surgery and infection.     After further evaluation and discussion today, the patient is indicated for surgery.  He  has good understanding of the relevant risks/benefits/alternatives and has requested surgical treatment.    (E&M Coding: 45409 - Preoperative history and physical)     cc: [place], [facility]*, [facility]

## 2017-03-18 NOTE — Unmapped (Signed)
PT IS HAVING SURGERY ON THE 18TH AND WANTS TO TALK TO THE DR ABOUT THINGS BEFORE SCHEDULING AN MED REFILLS. MDB

## 2017-03-19 ENCOUNTER — Ambulatory Visit: Admit: 2017-03-19 | Discharge: 2017-03-19

## 2017-03-19 ENCOUNTER — Ambulatory Visit: Admit: 2017-03-19 | Discharge: 2017-03-19 | Attending: Orthopaedic Surgery | Primary: Orthopaedic Surgery

## 2017-03-19 DIAGNOSIS — M79601 Pain in right arm: Principal | ICD-10-CM

## 2017-03-19 DIAGNOSIS — M4802 Spinal stenosis, cervical region: Principal | ICD-10-CM

## 2017-03-19 DIAGNOSIS — M79602 Pain in left arm: Secondary | ICD-10-CM

## 2017-03-19 LAB — CBC
HEMATOCRIT: 51.5 % (ref 41.0–53.0)
HEMOGLOBIN: 17.3 g/dL (ref 13.5–17.5)
MEAN CORPUSCULAR HEMOGLOBIN CONC: 33.7 g/dL (ref 31.0–37.0)
MEAN CORPUSCULAR HEMOGLOBIN: 33.5 pg (ref 26.0–34.0)
MEAN CORPUSCULAR VOLUME: 99.6 fL (ref 80.0–100.0)
RED BLOOD CELL COUNT: 5.17 10*12/L (ref 4.50–5.90)
RED CELL DISTRIBUTION WIDTH: 13.8 % (ref 12.0–15.0)
WBC ADJUSTED: 8.5 10*9/L (ref 4.5–11.0)

## 2017-03-19 LAB — SODIUM: Sodium:SCnc:Pt:Ser/Plas:Qn:: 139

## 2017-03-19 LAB — APTT: Coagulation surface induced:Time:Pt:PPP:Qn:Coag: 31.9

## 2017-03-19 LAB — BASIC METABOLIC PANEL
ANION GAP: 7 mmol/L — ABNORMAL LOW (ref 9–15)
CALCIUM: 9.8 mg/dL (ref 8.5–10.2)
CHLORIDE: 102 mmol/L (ref 98–107)
CO2: 30 mmol/L (ref 22.0–30.0)
CREATININE: 0.91 mg/dL (ref 0.70–1.30)
EGFR MDRD AF AMER: >=60 mL/min/{1.73_m2} (ref >=60)
GLUCOSE RANDOM: 97 mg/dL (ref 65–179)
POTASSIUM: 4.6 mmol/L (ref 3.5–5.0)
SODIUM: 139 mmol/L (ref 135–145)

## 2017-03-19 LAB — MEAN CORPUSCULAR HEMOGLOBIN CONC: Lab: 33.7

## 2017-03-19 LAB — PROTIME: Lab: 11.1

## 2017-03-19 LAB — PROTIME-INR: INR: 0.97

## 2017-03-19 MED FILL — MUPIROCIN/2%/OIN: MUPIROCIN/2%/OIN | 15 days supply | Qty: 1 | Fill #0

## 2017-03-19 NOTE — Unmapped (Signed)
I, Nemiah Commander, MD, saw and evaluated the patient, participating in the key elements of the service.  I discussed the findings, assessment and plan with the resident and agree with resident???s findings and plan as documented in the resident's note.

## 2017-03-20 MED FILL — HYDROXYCHLOROQUINE SULFAT/200MG/TABS: HYDROXYCHLOROQUINE SULFAT/200MG/TABS | 90 days supply | Qty: 180 | Fill #2

## 2017-03-20 MED FILL — PROVENTIL HFA//AERS: PROVENTIL HFA//AERS | 25 days supply | Qty: 1 | Fill #2

## 2017-03-27 NOTE — Unmapped (Signed)
ORTHOPAEDIC PROGRESS NOTE    s/p  C5-7 ACDF on 2/18    Comorbidities:   Past Medical History:   Diagnosis Date   ??? Alcoholism (CMS-HCC)    ??? Anxiety    ??? Arthritis    ??? Asthma    ??? Atrial fibrillation (CMS-HCC)    ??? Bipolar affective (CMS-HCC)    ??? Bronchitis    ??? Depression    ??? Headache    ??? Hepatitis C    ??? Hyperlipidemia    ??? Hypertension    ??? Infectious viral hepatitis    ??? Joint pain    ??? Lung disease    ??? Polysubstance abuse (CMS-HCC)    ??? RA (rheumatoid arthritis) (CMS-HCC)    ??? Seizures (CMS-HCC)    ??? Shoulder injury    ??? Sleep apnea, obstructive    ??? Sleeping difficulties      ASSESSMENT:  Joseph Sandoval is a 47 y.o. male doing well postoperatively.    PLAN:  -Up with assistance  -PT/OT if poor mobilization starting POD #1  -Drain out POD #1 unless output >30cc, if so discuss with MRL  -Soft collar  -Keep dressing intact; to be changed POD#1 or 2 with drain pull  -Soft mechanical diet until tolerating then advance to regular  -HOB > 60 degrees overnight POD 0, then ad lib starting POD 1   -Pain control: Oxycodone PRN  -Continue postoperative Ancef x 23h  -DVT Ppx: SCDs and TEDs. Please be sure to discharge home with TEDs.   -Hospitalist consult for perioperative medical management  -Discharge plan: pending PT/OT clearance & pain control; anticipate POD#1 or 2  -Follow up plan: 10-14 days post-op    - Plan to hold methotrexate, humira for 2 weeks following surgery    Please contact the orthopedic PA Roman Lawrin or primary resident during the day Monday-Friday.   Please contact the Orthopedics on call pager during the day on Saturday and Sunday  From 5pm- 6am please contact the hospitalist on call [phone]) 6pm during the week and weekends/holidays please contact Orthopaedic service pager [phone])    Current Orthopaedic Resident: Nelson Chimes, [phone]  Current Orthopaedic Service Attending: Dr. Jaynie Cuneo    SUBJECTIVE:  Doing well immediately post op. Reports pre-operative numbness/tingling improved. Denies fevers, chills, nausea/vomiting.    OBJECTIVE:  PE:  BP 127/79  - Pulse 86  - Temp 36.7 ??C (Oral)  - Resp 12  - Wt (!) 121.6 kg (268 lb)  - SpO2 95%  - BMI 40.76 kg/m??     Soft collar, well fitting.  Dressing c/d/i.    Minimal sanguinous drainage in drain    Motor R L   Deltoid 4 4   Bicep 4 4   Tricep 4 4   WE 4 4-   Grip 4- 4-   IO 5 5   IP 5 5   Quad 5 5   TA 4+ 4   EHL 3 3   GS 4 4

## 2017-03-27 NOTE — Unmapped (Signed)
Orthopaedics Discharge Summary    Admit date: 03/31/2017    Discharge date and time: 04/01/2017    Discharge to:  Home    Discharge Service: Orthopedics [facility])    Discharge Attending Physician: Nemiah Commander, MD    Discharge  Diagnoses: Cervical stenosis s/p cervical spine surgery    Secondary Diagnosis: Principal Problem:    Cervical spine pain  Active Problems:    Bipolar affective disorder (CMS-HCC)    OSA (obstructive sleep apnea)    OR Procedures:    Arthrodes, Ant Intrbdy, Incl Disc Spc Prep, Discect, Osteophyt/Decompress Spinl Crd &/Or Nrv Rt, Crv Blo C2  Arthrod, Ant Intbdy, Incl Disc Spc Prep/Disctmy/Ostephyt/Decmpr Spnl Crd/Nrv Rt; Cerv Belo C2, Ea Add`L Spc  Ant Instrum; 2 To 3 Verteb Segmt Cervical  Insert Interbody Biomechanical Device(S) With Integral Anterior Instrument For Device Anchoring, When Performed, To Intervertebral Disc Space In Conjunction With Interbody Arthrodesis, Each Interspace x2  Autograft/Spine Surg Only (W/Harvest Graft); Local (Eg, Rib/Spinous Proc, Ples Specter) Obtain From Same Incis  Allograft For Spine Surgery Only; Morselized  Continuous Intraoperative Neurophysiology Monitoring In Or  Date  03/31/2017  -------------------     Ancillary Procedures: no procedures    Discharge Day Services: The patient was seen and examined by the Orthopaedic Surgery team on the day of discharge. Vital signs and laboratory values were reviewed. Wounds were examined. Discharge plan was discussed, instructions were given and all questions answered.    Subjective   No acute events overnight. Pain Controlled. No fever or chills.    Objective  Patient Vitals for the past 8 hrs:   BP Temp Temp src Pulse SpO2 Pulse Resp SpO2   04/01/17 0400 128/75 - - 69 69 16 92 %   04/01/17 0000 153/76 36.7 ??C Oral 79 79 20 92 %     I/O this shift:  In: 675.3 [P.O.:480; I.V.:145.3; IV Piggyback:50]  Out: 1425 [Urine:1425]    Physical Exam:  General:  Alert and oriented, No acute distress  Extremity/wound location: Spine Incision: dressing clean, dry, and intact, no erythema    Motor R L   Deltoid 4 4   Bicep 4 4   Tricep 4 4   WE 4 4-   Grip 4 4   IO 5 5   IP 5 5   Quad 5 5   TA 4+ 4   EHL 3 3   GS 4 4   ??  Hospital Course: Joseph Sandoval is a 47 y.o. male who was admitted following C5-7 ACDF on 03/31/2017. The procedure was tolerated well. he was transported to PACU and the stepdown unit afterwards and found to be stable. Medical comorbidities were stable throughout the admission. No immediate complications were identified. Prior to discharge, he tolerated a regular diet, was voiding freely, and pain was controlled on oral medication. Patient was ambulating independently prior to discharge. He did not require formal PT or OT. Discharge to home on POD 1.    Condition at Discharge: Improved  Discharge Medications:     Plan to hold methotrexate, humira for 2 weeks following surgery       Your Medication List      START taking these medications    ondansetron 4 MG disintegrating tablet  Commonly known as:  ZOFRAN-ODT  Take 1 tablet (4 mg total) by mouth every eight (8) hours as needed for nausea.     oxyCODONE 5 MG immediate release tablet  Commonly known as:  ROXICODONE  Take  1-2 tablets (5-10 mg total) by mouth every four (4) hours as needed for pain. for up to 5 days     polyethylene glycol 17 gram packet  Commonly known as:  MIRALAX  Take 17 g by mouth every morning. for 7 days Hold if loose stools     senna 8.6 mg tablet  Commonly known as:  SENNA  Take 2 tablets by mouth nightly as needed for constipation. for up to 7 days Hold if loose stools        CHANGE how you take these medications    acetaminophen 500 MG tablet  Commonly known as:  TYLENOL  Take 2 tablets (1,000 mg total) by mouth every eight (8) hours. for 7 days  What changed:  ?? when to take this  ?? reasons to take this     methotrexate 25 mg/mL injection solution  Inject 1 mL (25 mg total) under the skin once a week.  Start taking on:  04/14/2017  What changed: These instructions start on 04/14/2017. If you are unsure what to do until then, ask your doctor or other care provider.        CONTINUE taking these medications    adalimumab 40 mg/0.8 mL injection  Commonly known as:  HUMIRA  Inject 0.8 mL (40 mg total) under the skin every fourteen (14) days.  Start taking on:  04/14/2017     albuterol 90 mcg/actuation inhaler  Commonly known as:  PROAIR HFA  Inhale 2 puffs every six (6) hours as needed for wheezing.     amLODIPine 5 MG tablet  Commonly known as:  NORVASC  Take 10 mg by mouth daily.     benztropine 0.5 MG tablet  Commonly known as:  COGENTIN  Take 0.5 mg by mouth Two (2) times a day.     carBAMazepine 200 mg tablet  Commonly known as:  TEGretol  Take 100 mg by mouth Two (2) times a day.     gabapentin 400 MG capsule  Commonly known as:  NEURONTIN  Take 400 mg by mouth Three (3) times a day.     hydroCHLOROthiazide 25 MG tablet  Commonly known as:  HYDRODIURIL  Take 25 mg by mouth daily.     hydroxychloroquine 200 mg tablet  Commonly known as:  PLAQUENIL  Take 1 tablet (200 mg total) by mouth Two (2) times a day.     metoprolol tartrate 25 MG tablet  Commonly known as:  LOPRESSOR  Take 50 mg by mouth Two (2) times a day.     mometasone-formoterol 200-5 mcg/actuation Hfaa  Inhale 2 puffs Two (2) times a day.     ranitidine 150 MG tablet  Commonly known as:  ZANTAC  Take 150 mg by mouth Two (2) times a day.     risperiDONE 2 MG tablet  Commonly known as:  RisperDAL  Take 2 mg by mouth nightly.     sertraline 50 MG tablet  Commonly known as:  ZOLOFT  Take 100 mg by mouth daily.     simvastatin 20 MG tablet  Commonly known as:  ZOCOR  Take 20 mg by mouth nightly.     syringe with needle 1 mL 25 gauge x 5/8 Syrg  Commonly known as:  TUBERCULIN SYRINGE  1 Units by Miscellaneous route every seven (7) days. To be used with methotrexate          Pending Test Results:   None    Discharge Instructions:  Follow Up instructions and Outpatient Referrals     Discharge instructions       Nemiah Commander, MD  Discharge Instructions - Anterior Cervical Fusion    YOUR COLLAR  You have been given 2 collars to wear postoperatively.  You must wear the blue/white collar at all times until seen in the office for your first post-operative visit.  The second brown collar is for showering.    SHOWERING  You may remove the outer dressings 72hrs after your surgery.  Leave the small white steri-strips in place.  You may shower once the bandages are removed.  Hair washing is permissible while in the shower. No soaking in a tub until seen in the office.      EXERCISE  You may walk or climb stairs as tolerated.  Walking frequently after surgery will help prevent blood clots.  Do NOT lift anything weighing greater than about 10 lbs.  (A gallon container of water weighs about 8.5 lbs)  Try to avoid lifting or reaching above your head.     INCISION  Please make sure your incisions are checked at least once daily for signs and symptoms of infection.  If any of the below should occur, please call us.  Drainage from incision site  Opening of incision  Increased redness and/or tenderness   Fevers greater than 101F  You have sutures on your incision that need to be removed 10-14 days following your surgery.    Keep the white steri-strips over the incision intact until the wound check visit.    SLEEPING  You may sleep in any position which makes you comfortable as long as your collar is securely in place.  Many patients find comfort sleeping in a recliner chair.  It is normal to have difficulty sleeping for the first several weeks following your surgery.  We recommend trying over-the-counter Benadryl or Tylenol PM if needed.      EATING  It is normal to have a sore throat and some difficulty swallowing dry solid foods after anterior cervical surgery.  This may persist for several weeks.   Eating soft foods like yogurt, ice cream and mashed potatoes can be more comfortable.    PAIN  Take the prescribed pain medications only if you are having pain.  Try to minimize the amount taken.  Do NOT take any anti-inflammatory medications (ibuprofen, Advil, Aleve, Motrin, etc.) for the first 10 weeks following your surgery.  These medications can prevent the spinal fusion from healing.  To help alleviate persistent soreness between the shoulder blades, apply ice or warm moist compresses.  It is normal for this discomfort to persist for several weeks following your surgery.   Do not resume taking bisphosphonates (Fosamax, Actonel, etc.) for 8 weeks after your fusion surgery.    DRIVING  You may NOT drive a car until cleared by your physician.  You may not drive while wearing a neck brace.  If you have to take a long trip as a passenger, make sure to stop every 30 minutes so that you can walk around and stretch your legs.  Reclining the passenger seat can make the trip more comfortable.     FOLLOW-UP APPOINTMENTS  Call [phone] or check MyChart ([email or web address] to confirm that you have a follow-up appointment 10-14 days after surgery.    IF YOU HAVE QUESTIONS or CONCERNS ???   During normal business hours, call our nurse at [phone] or our office at [phone].  During nights/weekends, call [facility]  at [phone] and ask for the on-call orthopaedic surgery resident.  For non-urgent concerns, you can also contact our team with MyChart ([email or web address]             Appointments which have been scheduled for you    Apr 14, 2017  8:00 AM EST  (Arrive by 7:45 AM)  RETURN  GENERAL with Viviann Spare, MD  [facility] PULMONARY SPECIALTY CL [place] [facility] REGION) [address]  [address]  [place] [address]  [phone]   Apr 25, 2017  1:15 PM EDT  (Arrive by 1:00 PM)  Danelle Earthly FOLLOW UP with Nemiah Commander, MD  [facility] SPINE CTR [address] [place] [facility]) [address]  [place] [address]  [phone] May 27, 2017  1:00 PM EDT  (Arrive by 12:45 PM)  Danelle Earthly FOLLOW UP with Nemiah Commander, MD  [facility] SPINE CTR [address] [place] [facility]) [address]  [place] [address]  [phone]   Jun 09, 2017  9:30 AM EDT  (Arrive by 9:15 AM)  RETURN  RHEUMATOLOGY with Aline Brochure, MD  [facility] RHEUMATOLOGY [address] [place] [facility] REGION) [address]  [place] [address]  [phone]

## 2017-03-30 DIAGNOSIS — M4802 Spinal stenosis, cervical region: Principal | ICD-10-CM

## 2017-03-31 ENCOUNTER — Ambulatory Visit: Admit: 2017-03-31 | Discharge: 2017-04-01

## 2017-03-31 ENCOUNTER — Encounter: Admit: 2017-03-31 | Discharge: 2017-04-01 | Attending: Anesthesiology | Primary: Anesthesiology

## 2017-03-31 DIAGNOSIS — M4802 Spinal stenosis, cervical region: Principal | ICD-10-CM

## 2017-03-31 MED ORDER — POLYETHYLENE GLYCOL 3350 17 GRAM ORAL POWDER PACKET: 17 g | packet | Freq: Every morning | 0 refills | 0 days | Status: AC

## 2017-03-31 MED ORDER — ONDANSETRON 4 MG DISINTEGRATING TABLET T-HOME
ORAL_TABLET | Freq: Three times a day (TID) | ORAL | 0 refills | 0.00000 days | Status: CP | PRN
Start: 2017-03-31 — End: 2017-04-30

## 2017-03-31 MED ORDER — SENNOSIDES 8.6 MG TABLET
ORAL_TABLET | Freq: Every evening | ORAL | 0 refills | 0.00000 days | Status: CP | PRN
Start: 2017-03-31 — End: 2017-03-31

## 2017-03-31 MED ORDER — ACETAMINOPHEN 500 MG TABLET: 1000 mg | tablet | Freq: Three times a day (TID) | 0 refills | 0 days | Status: AC

## 2017-03-31 MED ORDER — ACETAMINOPHEN 500 MG TABLET
ORAL_TABLET | Freq: Three times a day (TID) | ORAL | 0 refills | 0.00000 days | Status: CP
Start: 2017-03-31 — End: 2017-03-31

## 2017-03-31 MED ORDER — SENNOSIDES 8.6 MG TABLET: 2 | tablet | Freq: Every evening | 0 refills | 0 days | Status: AC

## 2017-03-31 MED ORDER — OXYCODONE 5 MG TABLET
ORAL_TABLET | ORAL | 0 refills | 0.00000 days | Status: CP | PRN
Start: 2017-03-31 — End: 2017-04-05

## 2017-03-31 MED ORDER — POLYETHYLENE GLYCOL 3350 17 GRAM ORAL POWDER PACKET
Freq: Every morning | ORAL | 0 refills | 0.00000 days | Status: CP
Start: 2017-03-31 — End: 2018-03-31

## 2017-03-31 MED ORDER — ONDANSETRON 4 MG DISINTEGRATING TABLET
ORAL_TABLET | 0 refills | 0 days
Start: 2017-03-31 — End: 2018-03-31

## 2017-03-31 NOTE — Unmapped (Signed)
ORTHOPAEDIC SPINE SERVICE       Nemiah Commander, MD  Associate Professor  [email or web address]  [phone]        Patient Name:Joseph Sandoval  MRN: [id number]  DOB: [birth year]    Operative Note  Date of Surgery: 03/31/2017  Admit Date: 03/31/2017  Attending Physician: Nemiah Commander, MD    Preoperative Diagnosis: Cervical stenosis and radiculopathy.   Morbid obesity (BMI 41).    Postoperative Diagnosis: Same    Procedure:  1) C5-6 and C6-7 anterior cervical decompression and fusion (56213, 22552)  2) C5, C6 and C7 instrumentation (08657)  3) Interbody cages x 2 ([id number])  4) Local bone graft and allograft (20930, 20936)       Resident Physician(s): Joseph Chimes, MD.    Anesthesia: General    Estimated Blood Loss: 30 mL    Complications: * No complications entered in OR log *    Specimens: * No specimens in log *    Indications for Surgery:    Joseph Sandoval is a 47 y.o. male indicated for surgical intervention.  [id number]  Former Dietitian. Recov EtOH/32yrs. Applying for disability.  Neuropathy. Afib. COPD. OSA. Bipolar. Tob. RA. HepC.  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 No c-radic.  Candidate for C5-7 anterior cervical decompression and instrumented fusion with cages/allograft (Monday 2/18 at [facility])  (Pulm cleared .[name] Extubate to BiPAP/CPAP)  (Dr.Jaleel ... stop his MTX 2 weeks before and 2 weeks after as well as stop the humira 2 weeks before and 2 weeks after).    Significant/relevant symptoms: neck pain, hand numbness and tingling b/l (L > R)  Duration/Since:10+ years ago, worsening since Nov 2017  Daily living & functional limitations: Unable to walk long distances. Difficulty sleeping 2/2 to pain which wakes him at night.Difficulty driving long distances. Inability to complete ADL's  Prior conservative treatments: Bilateral Carpal tunnel release physical therapy for 4 sessions without relief & worsening of symptoms and pain medications   Significant co-morbidities: afib (well controlled), seizures (cant remember last time he had one, controlled with carbamazepime) bipolar, hep C, COPD/OSA, RA, prior ETOH abuse, neuropathy, dysphagia (difficulty swallowing, variable in onset, only with secretions, able to tolerate food and drink ok)   Physical exam: dec muscle strength as below, dec sensation  Medical risk assessment: Patient has been medically cleared for surgery by PCP. Patient has been medically cleared for surgery by Pulmonologist & Rheumatologist.  Discussion: Risks/benefits and alternatives of surgery were discussed with the patient.  Risks/benefits of alternatives were also discussed. After further evaluation and discussion today, the patient understands risks/benefits/alternatives and has requested surgical treatment.    Operative Findings:  Calcified disc herniation on the right at C5-6.    Procedure: Patient was brought to the operating room by the Anesthesia team. He was intubated. Lines were placed. A Foley was placed. SCDs were placed. Spinal cord signal baselines were established. The baseline signals were stable with preop MEP deficits in the right TA and foot.  The neck was gently extended. The shoulders were taped down. Spinal cord signals were rechecked and unchanged. The anterior cervical spine was then prepped and draped in the usual sterile surgical fashion.     We localized our incision using lateral fluoroscopy.  This was very limited due to his body habitus. Antibiotics were given.  Surgical timeout was called.  A left horizontal incision was  made approximately at C6.  The platysma was divided and flaps were raised. The medial border of the sternocleidomastoid was identified. Dissection was then carried deeply and medially.  The omohyoid was identified and taken.  The carotid pulse pulse was palpated and blunt dissection was performed medial to the pulse to the prevertebral fascia. Crossing veins were tied off. The wound was deep due to the patient's habitus. There were large anterior osteophytes which were resected with the rongeur.  The C5-6 level was then confirmed with lateral fluoroscopy.  The longus colli were elevated off the bodies of C5 to C7.  The Shadowline retractors were then placed, with the blades underneath the longus colli.  The endotracheal tube cuff was deflated and reinflated. The wound was irrigated.     Attention was then to discectomy and decompression at C6-7. An initial discectomy was performed.  Two Caspar pins were placed and distracted. Spinal cord signals were unchanged. Bone quality was fair. A complete discectomy was performed.  The disc was severely degenerated.  The cartilage from the endplates and uncinates were curetted off.  The posterior osteophytes were resected with a bur to decompress the cord.  The annulus was completely resected.  The posterior uncus was resected bilaterally to decompress the nerve roots. The PLL was preserved.  The wound was copiously irrigated.     Attention was then turned to arthrodesis and cage insertion. The posterior endplates were decorticated. The disc space was trialed.   The uncovertebral joints were packed with demineralized bone matrix.  We placed a 6mm large DePuy Bengal cage.  The cage was filled with local bone graft from osteophyte shavings as well as demineralized bone matrix. The cage was stable. The wound was copiously irrigated.  Demineralized bone matrix was packed bilaterally. Spinal cord signals were unchanged.    Attention was then to C5-6 discectomy and decompression. The retractor was repositioned and the ET cuff was deflated/re-inflated.  An initial discectomy was performed.  Caspar pins were placed and distracted. Spinal cord signals were unchanged.  A complete discectomy was performed. The cartilage from the endplates and uncinates were curetted off.  The posterior osteophytes were resected with a bur to decompress the cord.  The annulus was completely resected.  The posterior uncus was resected bilaterally to decompress the nerve roots.  There was a partially calcified disc herniation/PLL on the right side.  This was resected piecemeal, leaving a very posterior PLL layer intact.  The wound was copiously irrigated.     Attention was then turned to arthrodesis and cage insertion. The posterior endplates were decorticated. The disc space was trialed.   The uncovertebral joints were packed with demineralized bone matrix.  We placed a 5mm standard DePuy Bengal cage.  The cage was filled with local bone graft from osteophyte shavings as well as demineralized bone matrix. The cage was stable. The wound was copiously irrigated.  Demineralized bone matrix was packed bilaterally. Spinal cord signals were unchanged.    Attention was turned to instrumentation. We chose a 30 mm DePuy Skyline plate. This was affixed to C5, C6 and C7 using 16mm screws. The screws were fixed in all positions.  Screw purchase was overall good. The locking mechanism was deployed.     Final radiographs revealed satisfactory position of the implants. Hemostasis was achieved and excellent. The esophagus and carotid sheath were inspected and found to be free of any visible injury. The platysma was reapproximated with 2-0 Vicryl. The skin was closed with 3-0 Vicryl  and steri-strips.     Post-Op Plan/Instructions: We will plan on extended recovery, monitoring for dysphagia/dehydration, airway compromise/re-intubation, neurologic compromise.  Head of bed elevated 60 degrees.  Sequential compression devices and TEDs for mechanical DVT prophylaxis.  Oral analgesia.  Discontinue Foley.  IV antibiotics for 23 hours.  Follow-up in clinic in 10 to 14 days for wound check.       Attending Surgeon Attestation: Cira Servant, MD, was present, scrubbed for and directed the entire operation from positioning to closure.     Nemiah Commander   Date: 03/31/2017

## 2017-03-31 NOTE — Unmapped (Signed)
[facility] Hospitalist Consult Note    Requesting Physician:  Nemiah Commander, MD of Orthopedics [facility])  Primary Care Physician: [facility]      Assessment/Recommendations:  Cervical spine pain    Principal Problem:    Cervical spine pain  Active Problems:    Bipolar affective disorder (CMS-HCC)    OSA (obstructive sleep apnea)  Resolved Problems:    * No resolved hospital problems. *         *Status post ACDF  - Pain control, DVT prophylaxis deferred to primary team (Orthopedics).  - PT/OT per primary team.  - Wound care, precautions, weight-bearing deferred to primary orthopedic team    *Hypertension.  Continue amlodipine and metoprolol  Restart hydrochlorothiazide tomorrow if appropriate    COPD/asthma  PRN albuterol  Mometasone/formoterol    Obstructive sleep apnea -patient recently evaluated in a sleep study  Will place patient on CPAP tonight based on recommendation from sleep study  Patient does have a CPAP at home, but has not been wearing it.  He was set up to go by for a different mask.    Tobacco use/abuse  Patient does continue to smoke about a pack a day.  I did discuss with the patient does not wish to use nicotine patch.    History of seizures-has not had a seizure for many years  Tegretol  To follow with neurology as an outpatient    Rheumatoid arthritis  Methotrexate once weekly holding for 2 weeks prior to surgery and 2 weeks after surgery  Humira holding 2 weeks prior to surgery and 2 weeks after surgery  Hydrochloroquine - discussed with primary orthopedic team and they are okay restarting this.    Bipolar disorder  Risperidone  Sertraline  Benztropine    Hyperlipidemia  Simvastatin    Chronic pain  Acute pain management deferred to primary orthopedic team    GERD  Formulary equivalent of Zantac      Lab Results   Component Value Date    A1C 5.0 07/15/2016       Labs reviewed   Patient's home medications reviewed and medical reconciliation performed      - Admission medication reconciliation completed.      Thank you for this consult.  We will continue to follow.      History of Present Illness:   Reason for Consult: Joseph Sandoval is seen at the request of Dr. Jaynie Storrs for medical management.       History of Present Illness:  Joseph Sandoval is 47 y.o. male with a PMH of cervical spine pain, rheumatoid arthritis, asthma/COPD, bipolar disorder, obstructive sleep apnea, history of seizures admitted s/p ACDF on 03/31/2017.  Patient continues to have persistent pain despite nonoperative measures. The decision was made to take the patient to surgery.    Denies recent fevers, chills, CP, DOE, edema, n/v/d.    In PACU, pain is controlled and vital signs are stable. EBL was 30 mls.  UOP 200 ml  No history of bleeding or clotting disorders.     Allergies:   Allergies   Allergen Reactions   ??? Lisinopril Swelling     swollen lips       Home Medications:  Prior to Admission medications    Medication Dose, Route, Frequency   acetaminophen (TYLENOL) 500 MG tablet 1,000 mg, Oral, Every 6 hours PRN   adalimumab (HUMIRA) 40 mg/0.8 mL injection 40 mg, Subcutaneous, Every 14 days   albuterol (PROAIR HFA)  90 mcg/actuation inhaler 2 puffs, Inhalation, Every 6 hours PRN   amLODIPine (NORVASC) 5 MG tablet 10 mg, Oral, Daily (standard)   benztropine (COGENTIN) 0.5 MG tablet 0.5 mg, Oral, 2 times a day (standard)   carBAMazepine (TEGRETOL) 200 mg tablet 100 mg, Oral, 2 times a day (standard)   gabapentin (NEURONTIN) 400 MG capsule 400 mg, Oral, 3 times a day (standard)   hydroCHLOROthiazide (HYDRODIURIL) 25 MG tablet 25 mg, Oral, Daily (standard)   hydroxychloroquine (PLAQUENIL) 200 mg tablet 200 mg, Oral, 2 times a day (standard)   methotrexate 25 mg/mL injection solution 25 mg, Subcutaneous, Weekly   metoprolol tartrate (LOPRESSOR) 25 MG tablet 50 mg, Oral, 2 times a day (standard)   mometasone-formoterol 200-5 mcg/actuation HFAA 2 puffs, Inhalation, 2 times a day   ranitidine (ZANTAC) 150 MG tablet 150 mg, Oral, 2 times a day (standard)   risperiDONE (RISPERDAL) 2 MG tablet 2 mg, Oral, Nightly   sertraline (ZOLOFT) 50 MG tablet 100 mg, Oral, Daily (standard)   simvastatin (ZOCOR) 20 MG tablet 20 mg, Oral, Nightly   acetaminophen (TYLENOL) 500 MG tablet 1,000 mg, Oral, Every 8 hours   folic acid (FOLVITE) 1 MG tablet 1 mg, Oral, Daily (standard)  Patient not taking: Reported on 03/31/2017   ondansetron (ZOFRAN-ODT) 4 MG disintegrating tablet 4 mg, Oral, Every 8 hours PRN   oxyCODONE (ROXICODONE) 5 MG immediate release tablet 5-10 mg, Oral, Every 4 hours PRN   polyethylene glycol (MIRALAX) 17 gram packet 17 g, Oral, Every morning, Hold if loose stools   senna (SENNA) 8.6 mg tablet 2 tablets, Oral, Nightly PRN, Hold if loose stools   syringe with needle (TUBERCULIN SYRINGE) 1 mL 25 gauge x 5/8 Syrg 1 Units, Miscellaneous, Every 7 days, To be used with methotrexate           Medical History:   Past Medical History:   Diagnosis Date   ??? Alcoholism (CMS-HCC)    ??? Anxiety    ??? Arthritis    ??? Asthma    ??? Atrial fibrillation (CMS-HCC)    ??? Bipolar affective (CMS-HCC)    ??? Bronchitis    ??? Depression    ??? Headache    ??? Hepatitis C    ??? Hyperlipidemia    ??? Hypertension    ??? Infectious viral hepatitis    ??? Joint pain    ??? Lung disease    ??? Polysubstance abuse (CMS-HCC)    ??? RA (rheumatoid arthritis) (CMS-HCC)    ??? Seizures (CMS-HCC)    ??? Shoulder injury    ??? Sleep apnea, obstructive    ??? Sleeping difficulties        Surgical History:   Past Surgical History:   Procedure Laterality Date   ??? CARDIAC SURGERY      2008 shock therapy   ??? PR NERVOUS SYSTEM SURGERY UNLISTED N/A 03/21/2016    Procedure: UNLISTED PROC NERVOUS SYSTEM;  Surgeon: Coralee Pesa, MD;  Location: [facility];  Service: Orthopedics   ??? PR RAD EXCIS WRIST SYNOV/TENDON,FLEXOR Left 03/21/2016    Procedure: RAD EXC BURSA WRIST TENDON SHEATHS; FLEXORS;  Surgeon: Coralee Pesa, MD;  Location: [facility];  Service: Orthopedics   ??? PR REVISE MEDIAN N/CARPAL TUNNEL SURG Left 10/08/2016    Procedure: NEUROPLASTY AND/OR TRANSPOSITION; MEDIAN NERVE AT CARPAL TUNNEL--MOD 22--REVISION;  Surgeon: Garnett Farm, MD;  [facility];  Service: Ortho Hand   ??? PR REVISE MEDIAN N/CARPAL TUNNEL SURG Right 10/22/2016  Procedure: NEUROPLASTY AND/OR TRANSPOSITION; MEDIAN NERVE AT CARPAL TUNNEL;  Surgeon: Garnett Farm, MD;  [facility];  Service: Ortho Hand   ??? PR TOTAL HIP ARTHROPLASTY Right 12/28/2015    Procedure: ARTHROPLASTY, ACETABULAR & PROXIMAL FEMORAL PROSTHETIC REPLACEMENT (TOTAL HIP), W/WO AUTOGRAFT/ALLOGRAFT;  Surgeon: Malka So, MD;  Location[facility];  Service: Orthopedics   ??? PR WRIST ARTHROSCOP,RELEASE XVERS LIG Left 03/21/2016    Procedure: LEFT ENDOSCOPIC CARPAL TUNNEL RELEASE;  Surgeon: Coralee Pesa, MD;  Location: [facility];  Service: Orthopedics       Social History:    Social History   Substance Use Topics   ??? Smoking status: Current Every Day Smoker     Packs/day: 1.00     Years: 30.00     Types: Cigarettes     Start date: 05/19/1984   ??? Smokeless tobacco: Former Neurosurgeon     Types: Snuff   ??? Alcohol use No      Comment: recovering     Social History     Social History   ??? Marital status: Single     Spouse name: N/A   ??? Number of children: N/A   ??? Years of education: N/A     Occupational History   ??? Not on file.     Social History Main Topics   ??? Smoking status: Current Every Day Smoker     Packs/day: 1.00     Years: 30.00     Types: Cigarettes     Start date: 05/19/1984   ??? Smokeless tobacco: Former Neurosurgeon     Types: Snuff   ??? Alcohol use No      Comment: recovering   ??? Drug use: No   ??? Sexual activity: Not on file     Other Topics Concern   ??? Not on file     Social History Narrative   ??? No narrative on file       Family History:   Family History   Problem Relation Age of Onset   ??? Heart attack Mother    ??? Hypertension Mother    ??? Arthritis Mother    ??? Cancer Mother    ??? Heart disease Mother    ??? Hypertension Father    ??? Arthritis Father    ??? Heart disease Father    ??? Arthritis Sister    ??? Migraines Sister    ??? Fibromyalgia Paternal Aunt        Code Status: Full Code    Review of Systems: 10-system review negative unless noted otherwise above.    Objective:    Physical Exam  Vitals (last 24 hrs): Temp:  [36.9 ??C (98.4 ??F)] 36.9 ??C (98.4 ??F)  Heart Rate:  [78] 78  Resp:  [16] 16  BP: (123)/(86) 123/86  SpO2:  [100 %] 100 %   General: Lying in bed, appears comfortable.  Awake and alert.   Eyes: Anicteric sclerae, no conjunctival injection.   ENT: Mucous membranes moist.   Respiratory: Clear to auscultation bilaterally.   Cardiovascular: No JVD.  Regular rate and rhythm without murmurs.  DP pulses 2+ bilaterally.   Gastrointestinal: Bowel sounds present, soft, nontender, nondistended.     Skin: Warm, no rashes   Musculoskeletal: MAE   Psychiatric: Alert, oriented.  Answers questions appropriately.   Neurologic: Extraocular movements intact, no facial asymmetry. Sensation intact for BUE and BLE. No gross motor deficits.       Imaging:  Xr Hip Ap W Frog, Xtab Cop Right    Result Date: 02/26/2017  EXAM: XR HIP AP W FROG, XTAB COP RIGHT DATE: 02/26/2017 2:11 PM ACCESSION: [id number] UN DICTATED: 02/26/2017 2:18 PM INTERPRETATION LOCATION: [facility] CLINICAL INDICATION: 47 years old Male with Eval right hip, main lobby-[id number]-Status post right hip replacement  COMPARISON: 05/01/2016 right hip radiograph and prior. TECHNIQUE: AP views of the pelvis and right hip with crosstable and frog leg lateral views of the right hip. FINDINGS: Right porous ingrowth total hip arthroplasty demonstrates unchanged alignment. Hardware components articulate appropriately and are well seated. There is a new peri-hardware lucency measuring up to 0.1 cm in zones 3-5 along the distal femoral component. No fracture. No soft tissue abnormalities. Left femoral head sclerosis is unchanged and without evidence of subchondral collapse. Right porous in growth total hip arthroplasty with new peri-hardware lucency about the distal femoral component.    Fl Fluoro < 60 Minutes (no Interp)    Result Date: 03/31/2017  Fluoroscopy was rendered to the performing Physician. The procedure will be resulted in HIM.      Laboratory Data:  Lab Results   Component Value Date    WBC 8.5 03/19/2017    HGB 17.3 03/19/2017    HCT 51.5 03/19/2017    PLT 274 03/19/2017       Lab Results   Component Value Date    NA 139 03/19/2017    K 4.6 03/19/2017    CL 102 03/19/2017    CO2 30.0 03/19/2017    BUN 12 03/19/2017    CREATININE 0.91 03/19/2017    GLU 97 03/19/2017    CALCIUM 9.8 03/19/2017    MG 1.9 12/28/2015    PHOS 4.5 12/28/2015       Lab Results   Component Value Date    BILITOT 0.5 07/14/2015    BILIDIR 0.50 (H) 10/20/2014    PROT 8.3 07/15/2016    ALBUMIN 4.7 12/09/2016    ALT 59 12/09/2016    AST 32 12/09/2016    ALKPHOS 71 07/14/2015       Lab Results   Component Value Date    INR 0.97 03/19/2017    APTT 31.9 03/19/2017

## 2017-03-31 NOTE — Unmapped (Signed)
ORTHOPAEDIC SPINE SERVICE       Joseph Commander, MD  Associate Professor  [email or web address]  [phone]        Patient Name:Joseph Sandoval  MRN: [id number]  DOB: [birth year]    Date: 03/19/2017    PCP: [facility]    ASSESSMENT:     Indications for Surgery:  Joseph Sandoval is a 47 y.o. male indicated for surgical intervention.  [phone]  Former Dietitian. Recov EtOH/35yrs. Applying for disability.  Neuropathy. Afib. COPD. OSA. Bipolar. Tob. RA. HepC.  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 No c-radic.  Candidate for C5-7 anterior cervical decompression and instrumented fusion with cages/allograft (Monday 2/18 at [facility])    (Pulm cleared .[name] Extubate to BiPAP/CPAP)  (Dr.Jaleel ... stop his MTX 2 weeks before and 2 weeks after as well as stop the humira 2 weeks before and 2 weeks after).    Significant/relevant symptoms: neck pain, hand numbness and tingling b/l (L > R)  Duration/Since:10+ years ago, worsening since Nov 2017  Daily living & functional limitations: Unable to walk long distances. Difficulty sleeping 2/2 to pain which wakes him at night.Difficulty driving long distances. Inability to complete ADL's  Prior conservative treatments: Bilateral Carpal tunnel release physical therapy for 4 sessions without relief & worsening of symptoms and pain medications   Significant co-morbidities: afib (well controlled), seizures (cant remember last time he had one, controlled with carbamazepime) bipolar, hep C, COPD/OSA, RA, prior ETOH abuse, neuropathy, dysphagia (difficulty swallowing, variable in onset, only with secretions, able to tolerate food and drink ok)   Physical exam: dec muscle strength as below, dec sensation  Medical risk assessment: Patient has been medically cleared for surgery by PCP. Patient has been medically cleared for surgery by Pulmonologist & Rheumatologist.  Discussion: Risks/benefits and alternatives of surgery were discussed with the patient.  Risks/benefits of alternatives were also discussed. After further evaluation and discussion today, the patient understands risks/benefits/alternatives and has requested surgical treatment.      PLAN:     ?? Surgery scheduled: Wed 03/31/17  ?? Pre-Operative Readiness/Evaluation(s):Cleared by Pulmonology with recommendations to extubate to CPAP/BiPAP, Cleared by Rheumatology with recommendations to stop his MTX & Humira 2 weeks before and 2 weeks after.   ?? Post-op follow-up: 10-14 days after surgery   Radiographs prior to next visit: Cervical AP and lateral views (prior to next visit).     SUBJECTIVE:     Joseph Sandoval returns today for further evaluation and treatment of his chief complaint of Neck Pain and bilateral hand numbness, tingling and clumsiness persistent despite bilateral carpal tunnel release.    History of Present Illness:        03/19/17 4259   PainSc:   8       Duration/Since: 10+ years ago, worsening since Nov 2017  Location: neck pain, hand numbness and tingling b/l  Modifying factors/Context: worse with walking and holding walker  Associated symptoms: low back pain, n/t in feet  He continues to have numbness/tingling and pain in median nerve distribution.  Associated with weakness in grip and decreased hand coordination.Did not improve following bilateral carpal tunnel release.  ??  Patient is recovering from alcoholism and has had n/t in hands and feet. He also has noted gate imbalance - he believes this has worsened over the last couple months.. Denies bowel or bladder issues.  Medical History   Past Medical History:   Diagnosis Date   ??? Alcoholism (CMS-HCC)    ??? Anxiety    ??? Arthritis    ??? Asthma    ??? Atrial fibrillation (CMS-HCC)    ??? Bipolar affective (CMS-HCC)    ??? Bronchitis    ??? Depression    ??? Headache    ??? Hepatitis C    ??? Hyperlipidemia    ??? Hypertension    ??? Infectious viral hepatitis    ??? Joint pain    ??? Lung disease    ??? Polysubstance abuse (CMS-HCC)    ??? RA (rheumatoid arthritis) (CMS-HCC)    ??? Seizures (CMS-HCC)    ??? Shoulder injury    ??? Sleep apnea, obstructive    ??? Sleeping difficulties       Surgical History   He  has a past surgical history that includes Cardiac surgery; pr total hip arthroplasty (Right, 12/28/2015); pr wrist arthroscop,release xvers lig (Left, 03/21/2016); pr nervous system surgery unlisted (N/A, 03/21/2016); pr rad excis wrist synov/tendon,flexor (Left, 03/21/2016); pr revise median n/carpal tunnel surg (Left, 10/08/2016); and pr revise median n/carpal tunnel surg (Right, 10/22/2016).   Allergies   Lisinopril   Medications   Current Outpatient Prescriptions   Medication Sig Dispense Refill   ??? acetaminophen (TYLENOL) 500 MG tablet Take 1,000 mg by mouth every six (6) hours as needed for pain.     ??? adalimumab (HUMIRA) 40 mg/0.8 mL injection Inject 0.8 mL (40 mg total) under the skin every fourteen (14) days. 1 each 2   ??? albuterol (PROAIR HFA) 90 mcg/actuation inhaler Inhale 2 puffs every six (6) hours as needed for wheezing. 1 Inhaler 11   ??? amLODIPine (NORVASC) 5 MG tablet Take 10 mg by mouth daily.      ??? benztropine (COGENTIN) 0.5 MG tablet Take 0.5 mg by mouth Two (2) times a day.     ??? carBAMazepine (TEGRETOL) 200 mg tablet Take 100 mg by mouth Two (2) times a day.      ??? chlorhexidine (HIBICLENS) 4 % external liquid Apply 1 application topically Two (2) times a day. for 3 days At incision site for 3 days prior and morning of surgery. 236 mL 0   ??? folic acid (FOLVITE) 1 MG tablet Take 1 tablet (1 mg total) by mouth daily. 90 tablet 3   ??? gabapentin (NEURONTIN) 400 MG capsule Take 400 mg by mouth Three (3) times a day.     ??? hydroCHLOROthiazide (HYDRODIURIL) 25 MG tablet Take 25 mg by mouth daily.  4   ??? hydroxychloroquine (PLAQUENIL) 200 mg tablet Take 1 tablet (200 mg total) by mouth Two (2) times a day. 60 tablet 6   ??? methotrexate 25 mg/mL injection solution Inject 1 mL (25 mg total) under the skin once a week. 4 mL 3   ??? metoprolol tartrate (LOPRESSOR) 25 MG tablet Take 50 mg by mouth Two (2) times a day.      ??? mometasone-formoterol 200-5 mcg/actuation HFAA Inhale 2 puffs Two (2) times a day. 1 Inhaler 11   ??? mupirocin (BACTROBAN) 2 % ointment Apply topically Two (2) times a day. for 3 days Apply to both nares and gently massage. Begin 3 days prior to surgery. 15 g 0   ??? ranitidine (ZANTAC) 150 MG tablet Take 150 mg by mouth Two (2) times a day.     ??? risperiDONE (RISPERDAL) 2 MG tablet Take 2 mg by mouth nightly.     ???  sertraline (ZOLOFT) 50 MG tablet Take 100 mg by mouth daily.   1   ??? simvastatin (ZOCOR) 20 MG tablet Take 20 mg by mouth nightly.     ??? syringe with needle (TUBERCULIN SYRINGE) 1 mL 25 gauge x 5/8 Syrg 1 Units by Miscellaneous route every seven (7) days. To be used with methotrexate 100 Syringe 3     No current facility-administered medications for this visit.       Review of Systems   A 10-system review was performed by questionnaire and noted in the electronic chart.  Positives noted/discussed.  Balance of systems was negative.   Family History   Bleeding or blood clotting disorders: denies  Problems with anesthesia: denies     Social History   Social History     Social History Narrative   ??? No narrative on file     He  reports that he has been smoking Cigarettes.  He started smoking about 32 years ago. He has a 30.00 pack-year smoking history. He has quit using smokeless tobacco. His smokeless tobacco use included Snuff. He reports that he does not drink alcohol or use drugs.   The history recorded in the table above was reviewed.    OBJECTIVE:     PHYSICAL EXAM:  Vitals: Wt (!) 121.4 kg (267 lb 9.6 oz)  - BMI 40.70 kg/m??     Appearance: well-nourished and no acute distress   Affect: alert and cooperative  Gait: antalgic  Straight Line Tandem Gait: tandem gait unable due to unsteadiness    Sensation: decreased senation in bilateral upper extremity, decreased sensation in bilateral SPN/DPN LE       Motor R L ?? Reflexes R L   Deltoid 4* 4* ?? Tricep 1+ 1+   Bicep 4* 4* ?? Bicep 1+ 1+   Tricep 4 4 ?? Brachiorad 1+ 1+   WE 4-* 4-* ?? ?? ?? ??   Grip 4- 4- ?? Patellar 1+ 1+   IO 5 5 ?? Achilles 1+ 1+   IP 5 5 ?? ?? ?? ??   Quad 5 5 ?? Pathologic R L   TA 4+* 4+* ?? Hoffmann's neg. neg.   EHL 3* 3* ?? Babinski neg. neg.   GS 4* 4* ?? Clonus neg. neg.   *Pain     MEDICAL DECISION MAKING    Test Results:  Imaging:   ?? See above Indications for Surgery    Discussion:  We discussed natural history of the problem, alternatives to surgical treatment, surgical options, risks/benefits of surgery, hospitalization, post-operative course and reviewed the patient-centered surgery decision-making presentation.      Discussion of benefits of surgery included: relief of neurogenic arm symptoms, prevention of further nerve injury, preservation of current function (ability to walk, use hands/arms/legs), neurologic improvement and functional improvement.    Patient understands that surgical treatment DOES NOT reliably relieve chronic axial neck pain.    Discussion of the risks of surgery included: paralysis from spinal cord injury, nerve injury, trouble swallowing, hoarse voice, adjacent segment disease, failure of bone to heal, need for further surgery, infection, bleeding and re-intubation, injury to esophagus and arteries and blood clots, heart attack, stroke, and death..    Patient understands that he is specifically at elevated risks for nerve injury, trouble swallowing, hoarse voice, adjacent segment disease, failure of bone to heal, need for further surgery and infection.     After further evaluation and discussion today, the patient is indicated for surgery.  He  has good understanding of the relevant risks/benefits/alternatives and has requested surgical treatment.    (E&M Coding: 45409 - Preoperative history and physical)     cc: [place], [facility]*, [facility]

## 2017-04-01 MED ORDER — METHOTREXATE SODIUM 25 MG/ML INJECTION SOLUTION
5 refills | 0 days
Start: 2017-04-01 — End: 2018-01-19

## 2017-04-01 MED ORDER — SYRINGE WITH NEEDLE 1 ML 25 GAUGE X 5/8"
PRN refills | 0 days | Status: CP
Start: 2017-04-01 — End: 2018-05-26

## 2017-04-01 MED ORDER — ADALIMUMAB 40 MG/0.8 ML SUBCUTANEOUS SYRINGE KIT
2 refills | 0 days
Start: 2017-04-01 — End: 2017-04-01

## 2017-04-01 MED FILL — ONDANSETRON ODT/4MG/TBDP: ONDANSETRON ODT/4MG/TBDP | 7 days supply | Qty: 20 | Fill #0

## 2017-04-01 MED FILL — OXYCODONE/5MG/TABS: OXYCODONE/5MG/TABS | 7 days supply | Qty: 84 | Fill #0

## 2017-04-01 MED FILL — POLYETHYLENE GLYCOL PACKETS/3350/PACK: POLYETHYLENE GLYCOL PACKETS/3350/PACK | 7 days supply | Qty: 7 | Fill #0

## 2017-04-01 MED FILL — SENNA TABLET/8.6MG/TAB: SENNA TABLET/8.6MG/TAB | 10 days supply | Qty: 20 | Fill #0

## 2017-04-01 MED FILL — ACETAMINOPHEN EXTRA STREN/500MG/TABS: ACETAMINOPHEN EXTRA STREN/500MG/TABS | 7 days supply | Qty: 42 | Fill #0

## 2017-04-01 NOTE — Unmapped (Signed)
Problem: Patient Care Overview  Goal: Plan of Care Review  Outcome: Progressing  Pt remains on 4L [place], pt received inhaled medication without adverse reaction, BBS clear diminished, pt refused cpap qhs, no complications or distress noted this shift.

## 2017-04-01 NOTE — Unmapped (Signed)
Problem: Patient Care Overview  Goal: Plan of Care Review  Outcome: Progressing  Pt has remained stable.  No distress.  C/o some mild throat pain made better with drinking.  Medicated once with oxy for neck and shoulder pain.  JP pulled accidentally by pt during the day shift.  X ray performed.  Foam tape to anterior neck dry and intact.  Soft cervical collar in place.  HOB elevated.  VSS.  Pt voiding in urinal.  Ambulated with minimal assistance.  Ate 100% of dinner.  No stool this shift.  meds given as ordered.  PIV wdl.  Pt is pleasant and cooperative.  Using 4L of o2 since was unable to use CPAP due to significan discomfort of mask.  IS at bedside and pt is using when awake.

## 2017-04-01 NOTE — Unmapped (Addendum)
Pt abruptly removed himself from cpap and states he cannot tolerate. Pt states he just cant breathe with cpap. Pt states he has the same issue at home, RN aware cpap remains at bedside and pt placed on 4L [place].

## 2017-04-01 NOTE — Unmapped (Signed)
Problem: Patient Care Overview  Goal: Plan of Care Review  No changes or distress noted.

## 2017-04-01 NOTE — Unmapped (Signed)
Hospitalist Daily Progress Note     LOS: 1 day     Assessment/Plan:  Principal Problem:    Cervical spine pain  Active Problems:    Bipolar affective disorder (CMS-HCC)    OSA (obstructive sleep apnea)  Resolved Problems:    * No resolved hospital problems. *       Status post ACDF  - Pain control, DVT prophylaxis deferred to primary team (Orthopedics).  - PT/OT per primary team.  - Wound care, precautions, weight-bearing deferred to primary orthopedic team  ??  *Hypertension.  Continue amlodipine and metoprolol  Restart hydrochlorothiazide on discharge  ??  COPD/asthma  PRN albuterol  Mometasone/formoterol  ??  Obstructive sleep apnea -patient recently evaluated in a sleep study  Patient does have a CPAP at home, but has not been wearing it.  He was set up to go by for a different mask.  ??  Tobacco use/abuse  Patient does continue to smoke about a pack a day.  I did discuss with the patient does not wish to use nicotine patch.  ??  History of seizures-has not had a seizure for many years  Tegretol  To follow with neurology as an outpatient  ??  Rheumatoid arthritis  Methotrexate once weekly holding for 2 weeks prior to surgery and 2 weeks after surgery  Humira holding 2 weeks prior to surgery and 2 weeks after surgery  Hydrochloroquine - discussed with primary orthopedic team and they are okay restarting this.  ??  Bipolar disorder  Risperidone  Sertraline  Benztropine  ??  Hyperlipidemia  Simvastatin  ??  Chronic pain  Acute pain management deferred to primary orthopedic team  ??  GERD  Formulary equivalent of Zantac    *DVT prophylaxis.  SCDs s and TEDs    *Dispo.  Discharge pending clearance for mobility and pain control.    Consultants:   Orthopedic primary      Subjective:   Patient states that he is doing better today.  Pain is reasonably well controlled.  He is tolerating p.o. diet without nausea or vomiting.    Objective:   Physical Exam:  General: Lying in bed, appears comfortable.  Awake and alert.   Eyes: Anicteric sclerae, no conjunctival injection.   ENT: Mucous membranes moist.   Respiratory: Clear to auscultation bilaterally.   Cardiovascular: Regular rate and rhythm without murmurs.   Gastrointestinal: Bowel sounds present, soft, nontender, nondistended.     Skin: Warm, no rashes   Musculoskeletal: MAE   Psychiatric: Alert, oriented.  Answers questions appropriately.   Neurologic: Extraocular movements intact, no facial asymmetry. Sensation intact for BUE and BLE. No gross motor deficits.         Vital signs in last 24 hours:  Temp:  [36.3 ??C (97.3 ??F)-37 ??C (98.6 ??F)] 36.5 ??C (97.7 ??F)  Heart Rate:  [69-104] 80  SpO2 Pulse:  [69-105] 81  Resp:  [10-24] 15  BP: (116-157)/(65-82) 128/75  MAP (mmHg):  [92-102] 92  SpO2:  [92 %-95 %] 93 %  BMI (Calculated):  [40.6] 40.6    Intake/Output last 24 hours:    Intake/Output Summary (Last 24 hours) at 04/01/17 0842  Last data filed at 04/01/17 0800   Gross per 24 hour   Intake          3765.33 ml   Output             2290 ml   Net          1475.33 ml  All lab results last 24 hours:  No results found for this or any previous visit (from the past 24 hour(s)).    Medications:   Scheduled Meds:  ??? amLODIPine  10 mg Oral Daily   ??? benztropine  0.5 mg Oral BID   ??? carBAMazepine  100 mg Oral BID   ??? celecoxib  200 mg Oral Daily   ??? docusate sodium  100 mg Oral BID   ??? famotidine  20 mg Oral BID   ??? fluticasone-salmeterol  1 puff Inhalation BID (RT)   ??? hydroCHLOROthiazide  25 mg Oral Daily   ??? hydroxychloroquine  200 mg Oral BID   ??? metoprolol tartrate  50 mg Oral BID   ??? polyethylene glycol  17 g Oral QAM   ??? pregabalin  75 mg Oral BID   ??? risperiDONE  2 mg Oral Nightly   ??? senna  2 tablet Oral Nightly   ??? sertraline  100 mg Oral Daily   ??? simvastatin  20 mg Oral Nightly     Continuous Infusions:  ??? lactated Ringers 20 mL/hr (04/01/17 0200)

## 2017-04-01 NOTE — Unmapped (Signed)
CM spoke with patient to complete assessment.  Patient is alert and oriented and will d/c home today.  His mother will pick him up for d/c.  Patient has no anticipated d/c needs, however he would like to fill his d/c medications at [facility] pharmacy. CM notified pharmacy. RN aware.      Care Management  Initial Transition Planning Assessment  Type of Residence: Mailing Address:  [address]   [place] [address]  Contacts: Accompanied by: Family member  Contact Details: Primary Contact  Primary Contact Name: Burnett Kanaris  Primary Contact Relationship: Mother  Phone #1: [phone]  Patient Phone Number: [phone] (home) [phone] (work)          Medical Provider(s): [facility]  Reason for Admission: Admitting Diagnosis:  cervical stenosis, cerv myelomalacia, cerv spondylosis w/meylopathy, cerv disc w/myelopathy, cerv cord compreesion/ and w/myelopathy, cerv spondylosis w/radic, cerv disc w/radic, cer radic w/myelopathy  Past Medical History:   has a past medical history of Alcoholism (CMS-HCC); Anxiety; Arthritis; Asthma; Atrial fibrillation (CMS-HCC); Bipolar affective (CMS-HCC); Bronchitis; Depression; Headache; Hepatitis C; Hyperlipidemia; Hypertension; Infectious viral hepatitis; Joint pain; Lung disease; Polysubstance abuse (CMS-HCC); RA (rheumatoid arthritis) (CMS-HCC); Seizures (CMS-HCC); Shoulder injury; Sleep apnea, obstructive; and Sleeping difficulties.  Past Surgical History:   has a past surgical history that includes Cardiac surgery; pr total hip arthroplasty (Right, 12/28/2015); pr wrist arthroscop,release xvers lig (Left, 03/21/2016); pr nervous system surgery unlisted (N/A, 03/21/2016); pr rad excis wrist synov/tendon,flexor (Left, 03/21/2016); pr revise median n/carpal tunnel surg (Left, 10/08/2016); and pr revise median n/carpal tunnel surg (Right, 10/22/2016).   Previous admit date: 12/28/2015    Primary Insurance- Payor: / Char[employer] care until 05/07/18  Secondary Insurance ??? None  Prescription Coverage ??? PAP until 07/11/17  Preferred Pharmacy - [facility] - [place], [place] - [address]  [facility] - [place] - [address]  [id number] - [place], [place] - [address]    Transportation home: Private vehicle  Level of function prior to admission: Independent                  General  Care Manager assessed the patient by : Medical record review, Discussion with Clinical Care team, In person interview with patient ( patient will d/c home today with no needs)  Orientation Level: Oriented X4  Who provides care at home?: Family member    Contact/Decision Maker:    Contact Details  Contact Details: Primary Contact  Primary Contact Name: Burnett Kanaris  Primary Contact Relationship: Mother  Phone #1: [phone]    Advance Directive (Medical Treatment)  Does patient have an advance directive covering medical treatment?: Patient does not have advance directive covering medical treatment.         Patient Information:    Lives with: Parent    Type of Residence: Private residence        Location/Detail:   [address]   [place] [address]      Support Systems: Parent    Responsibilities/Dependents at home?: No    Home Care services in place prior to admission?: No                  Equipment Currently Used at Home: commode, walker, rolling  Current HME Agency (Name/Phone #): Patient has HME from years ago with his joint replacement    Currently receiving outpatient dialysis?:  No       Financial Information:     Patient source of income: [facility] until 05/07/18 and PAP until 06/13/17    Need for financial assistance?: No       Discharge Needs Assessment:    Concerns to be Addressed: no discharge needs identified    Clinical Risk Factors: New Diagnosis    Barriers to taking medications: Yes (Comment) (Patient will fill his d/c medications at [facility] pharmacy)    Prior overnight hospital stay or ED visit in last 90 days: No    Readmission Within the Last 30 Days: no previous admission in last 30 days         Anticipated Changes Related to Illness: none    Equipment Needed After Discharge: none    Discharge Facility/Level of Care Needs: other (see comments)    Patient at risk for readmission?: No    Discharge Plan:    Screen findings are: Care Manager reviewed the plan of the patient's care with the Multidisciplinary Team. No discharge planning needs identified at this time. Care Manager will continue to manage plan and monitor patient's progress with the team.    Expected Discharge Date: 04/01/17    Expected Transfer from Critical Care:   April 01, 2017 - patient will d/c home from the CCU.      Patient and/or family were provided with choice of facilities / services that are available and appropriate to meet post hospital care needs?: N/A       Initial Assessment complete?: Yes    Clenton Pare  April 01, 2017 8:11 AM

## 2017-04-01 NOTE — Unmapped (Signed)
ORTHOPAEDIC PROGRESS NOTE    s/p  C5-7 ACDF on 2/18    Comorbidities:   Past Medical History:   Diagnosis Date   ??? Alcoholism (CMS-HCC)    ??? Anxiety    ??? Arthritis    ??? Asthma    ??? Atrial fibrillation (CMS-HCC)    ??? Bipolar affective (CMS-HCC)    ??? Bronchitis    ??? Depression    ??? Headache    ??? Hepatitis C    ??? Hyperlipidemia    ??? Hypertension    ??? Infectious viral hepatitis    ??? Joint pain    ??? Lung disease    ??? Polysubstance abuse (CMS-HCC)    ??? RA (rheumatoid arthritis) (CMS-HCC)    ??? Seizures (CMS-HCC)    ??? Shoulder injury    ??? Sleep apnea, obstructive    ??? Sleeping difficulties      ASSESSMENT:  Joseph Sandoval is a 47 y.o. male doing well postoperatively.    PLAN:  -Up with assistance  -PT/OT if poor mobilization starting POD #1  -Drain out POD #1 unless output >30cc, if so discuss with MRL, pulled by patient 2/18  -Soft collar  -Keep dressing intact; changed POD#1  -Soft mechanical diet until tolerating then advance to regular  -HOB > 60 degrees overnight POD 0, then ad lib starting POD 1   -Pain control: Oxycodone PRN  -Continue postoperative Ancef x 23h  -DVT Ppx: SCDs and TEDs. Please be sure to discharge home with TEDs.   -Hospitalist consult for perioperative medical management  -Discharge plan: pending medical clearance, pain control; anticipate POD#1  -Follow up plan: 10-14 days post-op    - Plan to hold methotrexate, humira for 2 weeks following surgery    Please contact the orthopedic PA Roman Lawrin or primary resident during the day Monday-Friday.   Please contact the Orthopedics on call pager during the day on Saturday and Sunday  From 5pm- 6am please contact the hospitalist on call [phone]) 6pm during the week and weekends/holidays please contact Orthopaedic service pager [phone])    Current Orthopaedic Resident: Nelson Chimes, [phone]  Current Orthopaedic Service Attending: Dr. Jaynie Dobosz    SUBJECTIVE:  Doing well. No acute events overnight. Yesterday reports accidentally pulling drain out while asleep. Reports some throat soreness but denies difficulties swallowing. Reports pre-operative numbness/tingling improved. Denies fevers, chills, nausea/vomiting.    OBJECTIVE:  PE:  BP 128/75  - Pulse 69  - Temp 36.7 ??C (Oral)  - Resp 16  - Ht 172.7 cm (5' 7.99)  - Wt (!) 121 kg (266 lb 12.1 oz)  - SpO2 92%  - BMI 40.57 kg/m??     Soft collar, well fitting.  Dressing c/d/i.    Minimal sanguinous drainage in drain    Motor R L   Deltoid 4 4   Bicep 4 4   Tricep 4 4   WE 4 4-   Grip 4 4   IO 5 5   IP 5 5   Quad 5 5   TA 4+ 4   EHL 3 3   GS 4 4

## 2017-04-02 NOTE — Unmapped (Signed)
Appt already rescheduled. No return call made.

## 2017-04-02 NOTE — Unmapped (Signed)
Post Op Call:    Called patient at home  Doing well overall.    No fevers/chills.  Wound clean, dry, intact.  Denies dysphagia.  Ate [employer] late night.  Less n/t in hands.  Able to use phone with left hand which he was unable to do before  Encouraged ambulation.  Encouraged minimization of narcotics.  Warning symptoms given  Routine follow-up as scheduled.  Informed pt/family the date/time of f/u visit.  Patient/Family has my personal cell # in case there are any issues.

## 2017-04-02 NOTE — Unmapped (Signed)
Surgery was 2/18.  Follow up appt 3/15. Patient states that his appt is suppose to be 2 weeks post surgery. Please call.

## 2017-04-03 NOTE — Unmapped (Signed)
[facility]: Rheumatology Clinic Assessment Call    Specialty Medication(s): subq MTX, Humira (@[employer] )  Indication(s): RA      [name], DOB: 06/22/1970  Above HIPAA information was verified with patient.      Medications reviewed & verified: Allergies - Medications -      Specialty medication(s) & dose(s) confirmed: yes  Changes to medications: yes, started APA and oxycodone after recent surgery  Changes to insurance: no  Tolerating medications:   Adverse Effects    *All other systems reviewed and are negative          ADHERENCE     Medication Adherence    Patient Reported X Missed Doses in the Last Month:  2  Specialty Medication:  methotrexate   Patient is on additional specialty medications:  No  Informant:  patient  Provider-Estimated Medication Adherence Level:  good  Medication Assistance Program  Refill Coordination  Shipping Information         CLINICAL ASSESSMENT     Dewayne Shorter. Catalfamo reports tolerating sub methotrexate well without adverse effects.  Adherence to therapy confirmed with patient and refill record @ [facility].  Patient reports he was discharged from the hospital yesterday s/p cervical spine surgery. He held methotrexate and Humira 2 weeks prior to will continue to hold meds 2 weeks after surgery.  Will push next refill call back.  RA improved on subq methotrexate and Humira.  Appropriate to continue subq MTX and Humira (through [place]) at this time.      Does [name] have follow up appointment scheduled with clinic? Yes, appointment is scheduled and patient is aware    All questions were answered and contact information provided for any future questions/concerns.      Jeneen Montgomery

## 2017-04-04 NOTE — Unmapped (Signed)
The patient was contacted status post surgery on C5-7 ACDF on 2/18 for routine postoperative telephone call.  The patient states that overall they are doing very well.  They state that the pain is well controlled, trying to wean off narcotic pain medications as able. He denies fevers, chills, nausea/vomiting.  The patient denies any issues with the incision.  The patient reports that they are able to eat and drink without any difficulties, reports scratchy throat.  We encouraged the patient to continue walking frequently to avoid blood clots.  We discussed that if the patient has any difficulties or concerning symptoms, we encouraged them to call the clinic office, or they have Dr. Duanne Moron personal cell phone number.  We will plan to see the patient further arranged postoperative clinic appointment.    Nelson Chimes, MD    ------------------------------------  Nemiah Commander, MD  **Please contact our nurse, Waynetta Sandy, at [phone] for any questions/concerns.    You may also call the [facility] at [phone].

## 2017-04-11 ENCOUNTER — Ambulatory Visit: Admit: 2017-04-11 | Discharge: 2017-04-11 | Attending: Orthopaedic Surgery | Primary: Orthopaedic Surgery

## 2017-04-11 ENCOUNTER — Ambulatory Visit: Admit: 2017-04-11 | Discharge: 2017-04-11

## 2017-04-11 DIAGNOSIS — Z981 Arthrodesis status: Principal | ICD-10-CM

## 2017-04-11 NOTE — Unmapped (Signed)
ORTHOPAEDIC SPINE CLINIC NOTE       Joseph Commander, MD  Professor of Orthopaedics  [phone]        Patient Name:Joseph Sandoval  MRN: [id number]  DOB: [birth year]    Date: 04/11/2017    PCP: [facility]    ASSESSMENT:     Joseph Sandoval  47 y.o. 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  Neuropathy. Afib. COPD. OSA. Bipolar. Tob. RA. HepC.  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 No c-radic.  S/p C5-7 ACDF (Monday 03/31/17 at [facility])    (Dr.Jaleel ... stop his MTX 2 weeks before and 2 weeks after as well as stop the humira 2 weeks before and 2 weeks after)      PLAN:     ?? Medications: Take over-the-counter medications as needed and as tolerated  ?? If you don't have liver disease, the safest medication for pain is acetaminophen (Tylenol).  Take 1000mg  (2 extra strength tabs) at a time for pain relief.  Do not take more than 3000mg  per day.  ?? Discontinue collar/brace.    ?? Gradual cautious return to normal activities.  ?? Imaging: Cervical AP and lateral views (prior to next visit).  ?? Follow-up: 3 months      SUBJECTIVE:     Chief Complaint:  Routine post-op appointment    History of Present Illness:        04/11/17 1232   PainSc:   8     The patient returns to clinic today status post the aforementioned procedure.  Patient states that overall he is doing well.  He states that the numbness and tingling is better in both hands, more improved in the right hand.  He reports that his neck pain is doing well but is complaining of hip and knee pain.  He states that he still feels slightly unstable while walking but overall improved.  He denies any fevers, chills.  He denies any issues eating or drinking.    Review of Systems:  Fever/chills: denies    Medical History   Past Medical History:   Diagnosis Date   ??? Alcoholism (CMS-HCC)    ??? Anxiety    ??? Arthritis    ??? Asthma    ??? Atrial fibrillation (CMS-HCC)    ??? Bipolar affective (CMS-HCC)    ??? Bronchitis    ??? Depression    ??? Headache    ??? Hepatitis C    ??? Hyperlipidemia    ??? Hypertension    ??? Infectious viral hepatitis    ??? Joint pain    ??? Lung disease    ??? Polysubstance abuse (CMS-HCC)    ??? RA (rheumatoid arthritis) (CMS-HCC)    ??? Seizures (CMS-HCC)    ??? Shoulder injury    ??? Sleep apnea, obstructive    ??? Sleeping difficulties       Surgical History   He  has a past surgical history that includes Cardiac surgery; pr total hip arthroplasty (Right, 12/28/2015); pr wrist arthroscop,release xvers lig (Left, 03/21/2016); pr nervous system surgery unlisted (N/A, 03/21/2016); pr rad excis wrist synov/tendon,flexor (Left, 03/21/2016); pr revise median n/carpal tunnel surg (Left, 10/08/2016); pr revise median n/carpal tunnel surg (Right, 10/22/2016); pr arthrodesis ant interbody inc discectomy, cervical below c2 (N/A, 03/31/2017); pr arthrodesis ant interbody inc discectomy, cervical below c2 each  addl (N/A, 03/31/2017); pr anterior instrumentation 2-3 vertebral segments (N/A, 03/31/2017); pr insj biomchn dev intervertebral dsc spc w/arthrd (N/A, 03/31/2017); pr autograft spine surgery local from same incision (N/A, 03/31/2017); pr allograft for spine surgery only morselized (N/A, 03/31/2017); and pr ionm 1 on 1 in or w/attendance each 15 minutes (N/A, 03/31/2017).     Allergies   Lisinopril   Medications   Current Outpatient Prescriptions   Medication Sig Dispense Refill   ??? acetaminophen (TYLENOL) 500 MG tablet Take 1,000 mg by mouth every six (6) hours as needed for pain.     ??? [START ON 04/14/2017] adalimumab (HUMIRA) 40 mg/0.8 mL injection Inject 0.8 mL (40 mg total) under the skin every fourteen (14) days. 1 each 2   ??? albuterol (PROAIR HFA) 90 mcg/actuation inhaler Inhale 2 puffs every six (6) hours as needed for wheezing. 1 Inhaler 11   ??? amLODIPine (NORVASC) 5 MG tablet Take 10 mg by mouth daily.      ??? benztropine (COGENTIN) 0.5 MG tablet Take 0.5 mg by mouth Two (2) times a day.     ??? carBAMazepine (TEGRETOL) 200 mg tablet Take 100 mg by mouth Two (2) times a day.      ??? gabapentin (NEURONTIN) 400 MG capsule Take 400 mg by mouth Three (3) times a day.     ??? hydroCHLOROthiazide (HYDRODIURIL) 25 MG tablet Take 25 mg by mouth daily.  4   ??? hydroxychloroquine (PLAQUENIL) 200 mg tablet Take 1 tablet (200 mg total) by mouth Two (2) times a day. 60 tablet 6   ??? [START ON 04/14/2017] methotrexate 25 mg/mL injection solution Inject 1 mL (25 mg total) under the skin once a week. 4 mL 3   ??? metoprolol tartrate (LOPRESSOR) 25 MG tablet Take 50 mg by mouth Two (2) times a day.      ??? mometasone-formoterol 200-5 mcg/actuation HFAA Inhale 2 puffs Two (2) times a day. 1 Inhaler 11   ??? ondansetron (ZOFRAN-ODT) 4 MG disintegrating tablet Take 1 tablet (4 mg total) by mouth every eight (8) hours as needed for nausea. 20 tablet 0   ??? ranitidine (ZANTAC) 150 MG tablet Take 150 mg by mouth Two (2) times a day.     ??? risperiDONE (RISPERDAL) 2 MG tablet Take 2 mg by mouth nightly.     ??? sertraline (ZOLOFT) 50 MG tablet Take 100 mg by mouth daily.   1   ??? simvastatin (ZOCOR) 20 MG tablet Take 20 mg by mouth nightly.     ??? syringe with needle (TUBERCULIN SYRINGE) 1 mL 25 gauge x 5/8 Syrg 1 Units by Miscellaneous route every seven (7) days. To be used with methotrexate 100 Syringe 3     No current facility-administered medications for this visit.       Social History   Social History     Social History Narrative   ??? No narrative on file     He  reports that he has been smoking Cigarettes.  He started smoking about 32 years ago. He has a 30.00 pack-year smoking history. He has quit using smokeless tobacco. His smokeless tobacco use included Snuff. He reports that he does not drink alcohol or use drugs.   The history recorded in the table above was reviewed.     OBJECTIVE:     PHYSICAL EXAM (6+pts):  Vitals: BP 120/80  - Pulse 75  - Temp 36.7 ??C  - Ht 172.7 cm (5' 7.99)  - Wt (!) 119.7 kg (  264 lb)  - BMI 40.15 kg/m??   Appearance: well-nourished and no acute distress   Affect: alert, cooperative and pleasant  Gait: normal  Neurologic: Sensation to light touch intact in hands   Skin: No cyanosis, clubbing, and normal skin in bilat hands.  Incision healed.     Motor R L   Deltoid 4 4   Bicep 4+ 5   Tricep 5 5   WE 5 4+   Grip 4+ 4+   IO 5 5   IP 5 5   Quad 5 5   TA 5 5   EHL 5 5   GS 5 5   ??     MEDICAL DECISION MAKING    Test Results:  Imaging:   C-spine XR. [place]. Today.  Implants stable. No instability.     Dr. Henreitta Leber, personally interpreted the images. Images were reviewed with the patient on the PACS monitor. The available reports were reviewed.     Discussion:  ?? Clinical findings, diagnostic/treatment options, and plan were discussed with the patient.  ?? Activities - Advised activities as tolerated, using pain as a guide.  ?? Activities - Encouraged walking    E&M Coding:  ?? 16109 - Global     cc: [place], [facility]*, [facility]

## 2017-04-11 NOTE — Unmapped (Signed)
??   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 Recommendations/Plan  ?? Medications: Take over-the-counter medications as needed and as tolerated  ?? If you don't have liver disease, the safest medication for pain is acetaminophen (Tylenol).  Take 1000mg  (2 extra strength tabs) at a time for pain relief.  Do not take more than 3000mg  per day.  ?? Try Salonpas Lidocaine 4% patches (https://www.cook.com/)  ?? Activities as tolerated.  ?? Discontinue collar/brace.    ?? Recommend progressive/daily walking exercise program  ?? Walk at least 30 minutes per day.  ?? No lifting >20 pounds.  ?? You need to stop smoking.  ?? Call [place] Quitline (1-800-QUIT-NOW) and/or talk to PCP to get help quitting smoking.  ?? Imaging studies: Cervical AP and lateral views (prior to next visit).  ?? Follow-up: To be scheduled in about 3 month(s).     ?? Avoid nicotine/tobacco  ?? Minimize alcohol intake  ?? Maintain a healthy weight    ?? Stay physically active and exercise regularly as tolerated ([email or web address])  ?? Maintain good sleeping habits    ?? We want to improve, and you can help.  You may receive a survey asking you about your visit.  Please take a few minutes to complete the survey.  We will use your feedback to make improvements.    ?? Contact our team electronically via MyChart message to [address] Clinical Staff.    ?? Contact our team at [phone] or [phone] with any questions/concerns.    ?? Please visit and like [place] on our Facebook page to learn more about our services.   [email or web address]

## 2017-04-12 NOTE — Unmapped (Signed)
I, Nemiah Commander, MD, saw and evaluated the patient, participating in the key elements of the service.  I discussed the findings, assessment and plan with the resident and agree with resident???s findings and plan as documented in the resident's note.

## 2017-04-13 NOTE — Unmapped (Signed)
Pulmonary Clinic - Initial Visit    Referring Physician :  [facility]  PCP:     [facility] [place]  Reason for Consult:   Shortness of breath     HISTORY:     History of Present Illness:  [name] is a 47 y.o. male with a history of paroxysmal A-fib, htn, asthma, Hep C, HPLD, prior substance abuse (etoh), Rheumatoid arthritis and OSA who presents for follow up evaluation in pulmonary clinic for evaluation of shortness of breath.     Pulmonary history:  Current every day smoker - 1-2 ppd currently. Started smoking at 47 years of age and has been continuous smoker for most of his life. Also notable is an extended period of inhaled crack cocaine use for 20 plus years. Quit this 3-4 years ago. No cigarillos/cigars or e-cigarettes reported. As of 2019 has weaned to 0.5 PPD while starting vaping.     Reports diagnosis of asthma in the past and was tried on an inhaler but is unsure on if it helped. Reports getting steroids once or twice in his life due to bronchitis but denies recurrent pulmonary infections, hospitalizations, or persistent productive cough.     He reports developing SOB/DOE about 2 years ago. He thinks this started about time he was started on CPAP for OSA. He denies any progression over this time frame. In addition to the Dyspnea he reports chest tightness and occasional dry cough with activity. He occasionally produces dark brown sputum - this occurs usually in the mornings and sparingly. He says even taking the trash out takes his breath away. Overall he thinks his symptoms are stable and outside of shortness of breath, pain is his biggest limiting factor with his mobility and ability to exert himself. He reports non-compliance with this CPAP due to claustrophobia with the full face mask.     For RA, he is managed with MTX/Humira and has been on it for about 1.5 years. He also recently started plaquenil for his Rheumatoid arthritis. Her is seropositive with CCP/RF+. Interval history 11/2016:   Given persistent abnormal CXR. HRCT obtained showing emphysema but no signs of ILD or other airspace disease. We initiated Anoro Elipta for COPD/emphysema in setting of mild obstructive lung disease. He could not get the Anoro refilled and hasn't picked up the [facility] so has not been on any long-acting treatment. He is still trying to quit smoking but says he's stopped 3 days ago. Has patches/gum. Talked to psychiatrists about chantix/wellbutrin and not candidate. Wondering about options for cpap mask he can't tolerate due to claustrophobia.     Interval hx 04/2017:   Is now on Humira, plaquenil, MTX for his RA. He's now down to 1/2 PPD and has started using a nicotine vape device to assist with cessation. He just had cervical spine fusion. He's been compliant with his Dulera two times daily and is averaging using albuterol about 2x/day. No interval antibiotics or steroids for exacerbations since last visit. Activity limited mostly by joint pains as opposed to dyspnea per discussion.     Pulmonary medications: Dulera 200-5 HFA 2 puff BID, albuterol HFA.     Past Medical History:   Diagnosis Date   ??? Alcoholism (CMS-HCC)    ??? Anxiety    ??? Arthritis    ??? Asthma    ??? Atrial fibrillation (CMS-HCC)    ??? Bipolar affective (CMS-HCC)    ??? Bronchitis    ??? Depression    ???  Headache    ??? Hepatitis C    ??? Hyperlipidemia    ??? Hypertension    ??? Infectious viral hepatitis    ??? Joint pain    ??? Lung disease    ??? Polysubstance abuse (CMS-HCC)    ??? RA (rheumatoid arthritis) (CMS-HCC)    ??? Seizures (CMS-HCC)    ??? Shoulder injury    ??? Sleep apnea, obstructive    ??? Sleeping difficulties      Past Surgical History:   Procedure Laterality Date   ??? CARDIAC SURGERY      2008 shock therapy   ??? PR ALLOGRAFT FOR SPINE SURGERY ONLY MORSELIZED N/A 03/31/2017    Procedure: Allograft For Spine Surgery Only; Morselized;  Surgeon: Nemiah Commander, MD;  [facility];  Service: Ortho Spine   ??? PR ANTERIOR INSTRUMENTATION 2-3 VERTEBRAL SEGMENTS N/A 03/31/2017    Procedure: Ant Instrum; 2 To 3 Verteb Segmt Cervical;  Surgeon: Nemiah Commander, MD;  Location: [facility] OR [facility];  Service: Ortho Spine   ??? PR ARTHRODESIS ANT INTERBODY INC DISCECTOMY, CERVICAL BELOW C2 N/A 03/31/2017    Procedure: Arthrodes, Ant Intrbdy, Incl Disc Spc Prep, Discect, Osteophyt/Decompress Spinl Crd &/Or Nrv Rt, Crv Blo C2;  Surgeon: Nemiah Commander, MD;  [facility] OR [facility];  Service: Ortho Spine   ??? PR ARTHRODESIS ANT INTERBODY INC DISCECTOMY, CERVICAL BELOW C2 EACH ADDL N/A 03/31/2017    Procedure: Arthrod, Ant Intbdy, Incl Disc Spc Prep/Disctmy/Ostephyt/Decmpr Spnl Crd/Nrv Rt; Cerv Belo C2, Ea Add`L Spc;  Surgeon: Nemiah Commander, MD;  Location: [facility] OR [facility];  Service: Ortho Spine   ??? PR AUTOGRAFT SPINE SURGERY LOCAL FROM SAME INCISION N/A 03/31/2017    Procedure: Autograft/Spine Surg Only (W/Harvest Graft); Local (Eg, Rib/Spinous Proc, Ples Specter) Obtain From Same Incis;  Surgeon: Nemiah Commander, MD;  [facility] OR [facility];  Service: Ortho Spine   ??? PR INSJ BIOMCHN DEV INTERVERTEBRAL DSC SPC W/ARTHRD N/A 03/31/2017    Procedure: Insert Interbody Biomechanical Device(S) With Integral Anterior Instrument For Device Anchoring, When Performed, To Intervertebral Disc Space In Conjunction With Interbody Arthrodesis, Each Interspace x2;  Surgeon: Nemiah Commander, MD;  [facility] OR [facility];  Service: Ortho Spine   ??? PR IONM 1 ON 1 IN OR W/ATTENDANCE EACH 15 MINUTES N/A 03/31/2017    Procedure: Continuous Intraoperative Neurophysiology Monitoring In Or;  Surgeon: Nemiah Commander, MD;  Location: [facility] OR [facility];  Service: Ortho Spine   ??? PR NERVOUS SYSTEM SURGERY UNLISTED N/A 03/21/2016    Procedure: UNLISTED PROC NERVOUS SYSTEM;  Surgeon: Coralee Pesa, MD;  Location: [facility] OR [facility];  Service: Orthopedics   ??? PR RAD EXCIS WRIST SYNOV/TENDON,FLEXOR Left 03/21/2016    Procedure: RAD EXC BURSA WRIST TENDON SHEATHS; FLEXORS;  Surgeon: Coralee Pesa, MD;  Location: [facility] OR [facility];  Service: Orthopedics   ??? PR REVISE MEDIAN N/CARPAL TUNNEL SURG Left 10/08/2016    Procedure: NEUROPLASTY AND/OR TRANSPOSITION; MEDIAN NERVE AT CARPAL TUNNEL--MOD 22--REVISION;  Surgeon: Garnett Farm, MD;  [facility];  Service: Ortho Hand   ??? PR REVISE MEDIAN N/CARPAL TUNNEL SURG Right 10/22/2016    Procedure: NEUROPLASTY AND/OR TRANSPOSITION; MEDIAN NERVE AT CARPAL TUNNEL;  Surgeon: Garnett Farm, MD;  [facility];  Service: Ortho Hand   ??? PR TOTAL HIP ARTHROPLASTY Right 12/28/2015    Procedure: ARTHROPLASTY, ACETABULAR & PROXIMAL FEMORAL PROSTHETIC REPLACEMENT (TOTAL HIP), W/WO AUTOGRAFT/ALLOGRAFT;  Surgeon:  Malka So, MD;  Location: [facility] OR [facility];  Service: Orthopedics   ??? PR WRIST ARTHROSCOP,RELEASE XVERS LIG Left 03/21/2016    Procedure: LEFT ENDOSCOPIC CARPAL TUNNEL RELEASE;  Surgeon: Coralee Pesa, MD;  Location: [facility] OR [facility];  Service: Orthopedics       Other History:  The social history and family history were personally reviewed and updated in the patient's electronic medical [place].    Family History   Problem Relation Age of Onset   ??? Heart attack Mother    ??? Hypertension Mother    ??? Arthritis Mother    ??? Cancer Mother    ??? Heart disease Mother    ??? Hypertension Father    ??? Arthritis Father    ??? Heart disease Father    ??? Arthritis Sister    ??? Migraines Sister    ??? Fibromyalgia Paternal Aunt      Social History     Social History   ??? Marital status: Single     Spouse name: N/A   ??? Number of children: N/A   ??? Years of education: N/A     Occupational History   ??? Not on file.     Social History Main Topics   ??? Smoking status: Current Every Day Smoker     Packs/day: 1.00     Years: 30.00     Types: Cigarettes     Start date: 05/19/1984   ??? Smokeless tobacco: Former Neurosurgeon     Types: Snuff   ??? Alcohol use No      Comment: recovering   ??? Drug use: No   ??? Sexual activity: Not on file     Other Topics Concern   ??? Not on file     Social History Narrative   ??? No narrative on file       Home Medications:  Current Outpatient Prescriptions on File Prior to Visit   Medication Sig Dispense Refill   ??? acetaminophen (TYLENOL) 500 MG tablet Take 1,000 mg by mouth every six (6) hours as needed for pain.     ??? [START ON 04/14/2017] adalimumab (HUMIRA) 40 mg/0.8 mL injection Inject 0.8 mL (40 mg total) under the skin every fourteen (14) days. 1 each 2   ??? albuterol (PROAIR HFA) 90 mcg/actuation inhaler Inhale 2 puffs every six (6) hours as needed for wheezing. 1 Inhaler 11   ??? amLODIPine (NORVASC) 5 MG tablet Take 10 mg by mouth daily.      ??? benztropine (COGENTIN) 0.5 MG tablet Take 0.5 mg by mouth Two (2) times a day.     ??? carBAMazepine (TEGRETOL) 200 mg tablet Take 100 mg by mouth Two (2) times a day.      ??? gabapentin (NEURONTIN) 400 MG capsule Take 400 mg by mouth Three (3) times a day.     ??? hydroCHLOROthiazide (HYDRODIURIL) 25 MG tablet Take 25 mg by mouth daily.  4   ??? hydroxychloroquine (PLAQUENIL) 200 mg tablet Take 1 tablet (200 mg total) by mouth Two (2) times a day. 60 tablet 6   ??? [START ON 04/14/2017] methotrexate 25 mg/mL injection solution Inject 1 mL (25 mg total) under the skin once a week. 4 mL 3   ??? metoprolol tartrate (LOPRESSOR) 25 MG tablet Take 50 mg by mouth Two (2) times a day.      ??? mometasone-formoterol 200-5 mcg/actuation HFAA Inhale 2 puffs Two (2) times a day. 1 Inhaler 11   ??? ondansetron (  ZOFRAN-ODT) 4 MG disintegrating tablet Take 1 tablet (4 mg total) by mouth every eight (8) hours as needed for nausea. 20 tablet 0   ??? ranitidine (ZANTAC) 150 MG tablet Take 150 mg by mouth Two (2) times a day.     ??? risperiDONE (RISPERDAL) 2 MG tablet Take 2 mg by mouth nightly.     ??? sertraline (ZOLOFT) 50 MG tablet Take 100 mg by mouth daily.   1   ??? simvastatin (ZOCOR) 20 MG tablet Take 20 mg by mouth nightly.     ??? syringe with needle (TUBERCULIN SYRINGE) 1 mL 25 gauge x 5/8 Syrg 1 Units by Miscellaneous route every seven (7) days. To be used with methotrexate 100 Syringe 3     No current facility-administered medications on file prior to visit.        Allergies:  Allergies as of 04/14/2017 - Reviewed 04/11/2017   Allergen Reaction Noted   ??? Lisinopril Swelling 01/20/2015       Review of Systems:  Ten systems were reviewed and were negative except as indicated in the above history.    PHYSICAL EXAM:   Resp 16  - Wt (!) 120.7 kg (266 lb)  - BMI 40.45 kg/m??   General: Alert and oriented, no acute distress  HEENT: MMM, clear oropharynx  CV: RRR, distant heart sounds.   Lungs: fair air movement, no increased work of breathing and no additional breath sounds.   Abd: Soft, NT, ND, no rebound or guarding  Ext: Warm, well perfused, no peripheral edema  Skin: No rashes  Neuro: No focal deficits    LABORATORY and RADIOLOGY DATA:     Pulmonary Function Tests:  Date: FVC (% Pred) FEV1 (% Pred)  Pre-BD FEV1 (%Pred)  Post- BD FEV1/FVC FEF25-75(% Pred) DLCO (% Pred)   September 01, 2016  4.69 (93%)  2.97 (76%)  3.36 (85%)  0.63  1.41 (39%)  24.6 (79%)       Date: TLC (% Pred) VC (% Pred) FRC (% Pred) ERV (% Pred) RV (% Pred)   September 01, 2016  7.1 (104%)    4.92 (98%)  4.19 (93%)  1.20 (67%)  2.18 (112%)     My interpretation is:  Mild obstruction on spirometry, significant bronchodilator response. Normal DLCO. Lung volumes are normal.     Pertinent Laboratory Data:    No eosinophilia   CCP and RF positive    Pertinent Imaging Data:    07/2013 CXR (cone) - no active cardiopulmonary dz appreciated     11/2014 CXR (cone) - acute displaced lateral eighth rib fracture, mild diffuse interstitial prominence - stable.    02/2016 CXR - 1. Stable cardiomegaly. 2. Mild stable bilateral interstitial prominence consistent chronic interstitial lung disease.    09/16/16 HRCT - Resolution of bilateral basilar pulmonary parenchymal opacities on prone images consistent with dependent atelectasis. No focal airspace consolidation is seen. No air trapping is noted on expiratory images. There are no pathologically enlarged mediastinal, hilar, or axillary lymph nodes. Normal heart size. Upper lobe predominant paraseptal emphysema present.       ASSESSMENT and PLAN     Joseph Sandoval is a 47 y.o. male with history of paroxysmal A-fib, htn, asthma, Hep C, HPLD, prior substance abuse (etoh), Rheumatoid arthritis and OSA who presents for follow up evaluation in pulmonary clinic for evaluation of shortness of breath.     1. Shortness of breath/COPD gold stage 2B:  - It appears his dyspnea is multifactorial with  smoking related lung disease and possible asthma overlap given reversibility on bronchodilator testing and some history of exposure related symptoms.   - HRCT shows only apical paraseptal predominant emphysema - likely smoking related but consider A1AT next visit. No evidence of ILD despite abnormal chest radiographs.   - We provided him Anoro but could get it refilled through pharmacy assitance and is now on [facility] and doing overall well with symptom burden.   - Will ctn same management and consider addition of antihistamine for rhinitis symptoms.     2. OSA:  - untreated due to overt intolerance to mask interface  - referral to sleep medicine as may need re titration     3. Smoking:   - trying to quit actively, has nicotine replacement but now using nicotine vape and down to 1/2 ppd cigarettes.   - discussed difficulty with this approach.     Health maitenance:   - flu shot given in October.   - UTD on Prevnar and Pneumococcal vaccine.     The patient was seen with Dr. Jefferey Pica and will return to clinic in 12 months    [facility]    Viviann Spare, MD, PhD  April 13, 2017  Pulmonary & Critical Care Fellow  [facility]   Pager: [phone]

## 2017-04-14 ENCOUNTER — Ambulatory Visit: Admit: 2017-04-14 | Discharge: 2017-04-15 | Attending: Critical Care Medicine | Primary: Critical Care Medicine

## 2017-04-14 DIAGNOSIS — J449 Chronic obstructive pulmonary disease, unspecified: Principal | ICD-10-CM

## 2017-04-14 MED ORDER — MOMETASONE-FORMOTEROL HFA 200 MCG-5 MCG/ACTUATION AEROSOL INHALER: 2 | g | 6 refills | 0 days

## 2017-04-14 MED ORDER — ALBUTEROL SULFATE HFA 90 MCG/ACTUATION AEROSOL INHALER: g | 3 refills | 0 days

## 2017-04-14 MED ORDER — MOMETASONE-FORMOTEROL HFA 200 MCG-5 MCG/ACTUATION AEROSOL INHALER: 2 | g | Freq: Two times a day (BID) | 6 refills | 0 days | Status: AC

## 2017-04-14 MED ORDER — METHOTREXATE SODIUM 25 MG/ML INJECTION SOLUTION
SUBCUTANEOUS | 3 refills | 0 days | Status: CP
Start: 2017-04-14 — End: 2017-04-14

## 2017-04-14 MED ORDER — ALBUTEROL SULFATE HFA 90 MCG/ACTUATION AEROSOL INHALER
Freq: Four times a day (QID) | RESPIRATORY_TRACT | 3 refills | 0.00000 days | Status: CP | PRN
Start: 2017-04-14 — End: 2017-04-14

## 2017-04-14 MED ORDER — MOMETASONE-FORMOTEROL HFA 200 MCG-5 MCG/ACTUATION AEROSOL INHALER: 2 | g | Freq: Two times a day (BID) | 3 refills | 0 days | Status: AC

## 2017-04-14 MED ORDER — ADALIMUMAB 40 MG/0.8 ML SUBCUTANEOUS SYRINGE KIT
SUBCUTANEOUS | 2 refills | 0.00000 days | Status: CP
Start: 2017-04-14 — End: 2017-06-09

## 2017-04-14 MED ORDER — MOMETASONE-FORMOTEROL HFA 200 MCG-5 MCG/ACTUATION AEROSOL INHALER
Freq: Two times a day (BID) | RESPIRATORY_TRACT | 3 refills | 0.00000 days | Status: CP
Start: 2017-04-14 — End: 2018-04-14

## 2017-04-14 MED ORDER — ALBUTEROL SULFATE HFA 90 MCG/ACTUATION AEROSOL INHALER: 2 | Inhaler | Freq: Four times a day (QID) | 3 refills | 0 days | Status: AC

## 2017-04-14 MED ORDER — ALBUTEROL SULFATE HFA 90 MCG/ACTUATION AEROSOL INHALER: g | 6 refills | 0 days

## 2017-04-14 NOTE — Unmapped (Addendum)
You CAN quit smoking!  Call 1-800-QUIT-NOW for help!    We have renewed Dulera and Albuterol for management of COPD and possible asthma.     If your breathing is worse in the spring with the aero-allergens resuming a once daily antihistamine would be reasonable such as zyrtec, allegra, claritin. An alternative could be flonase nasal spray.     Please take your medications as prescribed.  Thank you for allowing me to be a part of your care.  Please call the clinic with any questions.    Viviann Spare, MD  Pulmonary and Critical Care Medicine  [address]  [id number]  [place], [place] [address]    Thank you for your visit to the [facility] Pulmonary Clinics.  You may receive a survey from [facility] regarding your visit today, and we are eager to use this feedback to improve your experience.  Thank you for taking the time to fill it out.    Between appointments, you can reach [place] at these numbers:    For appointments or the Pulmonary Nurse: [phone]  Fax: [phone]    For urgent issues after hours:  Hospital Operator: [phone], ask for Pulmonary Fellow on call

## 2017-04-16 NOTE — Unmapped (Signed)
I saw and evaluated the patient, participating in the key portions of the service.  I reviewed the resident???s note.  I agree with the resident???s findings and plan. Dena Billet, MD

## 2017-04-28 NOTE — Unmapped (Signed)
[facility] Refill Coordination Note  Specialty Medication(s): METHOTREXATE  Additional Medications shipped: DULERA/FOLIC ACID/SYRINGES    [name], DOB: 1970/10/10  Phone: [phone] (home) [phone] (work), Alternate phone contact: N/A  Phone or address changes today?: No  All above HIPAA information was verified with patient.  Shipping Address: [address]   [place] [address]   Insurance changes? No    Completed refill call assessment today to schedule patient's medication shipment from the [facility] [phone]).      Confirmed the medication and dosage are correct and have not changed: Yes, regimen is correct and unchanged.    Confirmed patient started or stopped the following medications in the past month:  No, there are no changes reported at this time.    Are you tolerating your medication?:  [name] reports tolerating the medication.    ADHERENCE    Patient was taken off of Methotrexate before surgery but he is back on it at this time. Has one vial left at home.     Did you miss any doses in the past 4 weeks? Yes.  [name] reports missing 7 days of medication therapy in the last 4 weeks.  [name] reports surgery as the cause of their non-adherance.    FINANCIAL/SHIPPING    Delivery Scheduled: Yes, Expected medication delivery date: 05/02/17     The patient will receive an FSI print out for each medication shipped and additional FDA Medication Guides as required.  Patient education from [place] or [name] may also be included in the shipment    [name] did not have any additional questions at this time.    Delivery address validated in FSI scheduling system: Yes, address listed in FSI is correct.    We will follow up with patient monthly for standard refill processing and delivery.      Thank you,  Renette Butters   [facility] Specialty Technician

## 2017-05-01 MED FILL — FOLIC ACID/1MG/TABS: FOLIC ACID/1MG/TABS | 90 days supply | Qty: 90 | Fill #0

## 2017-05-01 MED FILL — METHOTREXATE/25MG/ML/SOLN: METHOTREXATE/25MG/ML/SOLN | 28 days supply | Qty: 2 | Fill #0

## 2017-05-01 MED FILL — DULERA/200-5MCG/AERO: DULERA/200-5MCG/AERO | 90 days supply | Qty: 3 | Fill #2

## 2017-05-01 MED FILL — BD TB 1mL/25G 5/8 INCH/SYR: BD TB 1mL/25G 5/8 INCH/SYR | 84 days supply | Qty: 12 | Fill #0

## 2017-05-22 NOTE — Unmapped (Signed)
[facility] Refill Coordination Note    Specialty Program and Medication(s) to be Shipped: METHOTREXATE  Other medications to be shipped: PROVENTIL     Joseph Sandoval, DOB: Sep 22, 1970  Phone: [phone] (home) [phone] (work)  Shipping Address: [address]   [place] [address]  Address confirmed in FSI: yes  All above HIPAA information was verified with patient.     Completed refill call assessment today to schedule patient's medication shipment from the [facility] [phone]).       Specialty medication(s) and dose(s) confirmed: Yes: METHOTREXATE -WEEKLY   Changes to medications: No  Changes to insurance: No  Questions for the pharmacist: No    The patient will receive an FSI print out for each medication shipped and additional FDA Medication Guides as required.  Patient education from [place] or [name] may also be included in the shipment.    DISEASE-SPECIFIC INFORMATION        For Rheumatology patients: Next dose of METHOTREXATE from this shipment due on 06/06/17    ADHERENCE    PATIENT STATES HE MISSED LAST Friday'S DOSE AND DIDN'T REMEMBER TIL LATE THIS WEEK-HE'S GOING TO WAIT UNTIL TOMORROW TO TAKE IT SO HE CAN STILL DOSE ON FRIDAYS     Medication Adherence    Patient Reported X Missed Doses in the Last Month:  1  Specialty Medication:  METHOTREXATE  Patient is on additional specialty medications:  No  Patient is on more than two specialty medications:  No  Gaps in Refill History Greater than 2 Weeks in the Last 3 Months:  No  Demonstrates Understanding of Importance of Adherence:  Yes  Adherence Tools Used:  calendar  Confirmed Plan for Next Specialty Medication Refill:  delivery by pharmacy  Refills Needed for Supportive Medications:  not needed  Medication Assistance Program  Refill Coordination  Has the Patients' Contact Information Changed:  No    Is the Shipping Address Different:  No    Shipping Information  Delivery Scheduled:  Yes  Delivery Date:  05/29/07  Medications to be Shipped:  METHOTREXATE AND PROVENTIL       SHIPPING     Delivery Scheduled: yes, Expected medication delivery date: 05/28/17    Renette Butters  [facility]

## 2017-05-27 MED FILL — METHOTREXATE/25MG/ML/SOLN: METHOTREXATE/25MG/ML/SOLN | 28 days supply | Qty: 2 | Fill #2

## 2017-05-27 MED FILL — ALBUTEROL HFA/90MCG/AER: ALBUTEROL HFA/90MCG/AER | 25 days supply | Qty: 1 | Fill #3

## 2017-06-09 ENCOUNTER — Ambulatory Visit: Admit: 2017-06-09 | Discharge: 2017-06-09

## 2017-06-09 ENCOUNTER — Ambulatory Visit: Admit: 2017-06-09 | Discharge: 2017-06-09 | Attending: Rheumatology | Primary: Rheumatology

## 2017-06-09 DIAGNOSIS — M0579 Rheumatoid arthritis with rheumatoid factor of multiple sites without organ or systems involvement: Principal | ICD-10-CM

## 2017-06-09 LAB — CBC W/ AUTO DIFF
BASOPHILS ABSOLUTE COUNT: 0.1 10*9/L (ref 0.0–0.1)
BASOPHILS RELATIVE PERCENT: 1 %
EOSINOPHILS ABSOLUTE COUNT: 0.2 10*9/L (ref 0.0–0.4)
HEMATOCRIT: 52.1 % (ref 41.0–53.0)
HEMOGLOBIN: 17.6 g/dL — ABNORMAL HIGH (ref 13.5–17.5)
LARGE UNSTAINED CELLS: 4 % (ref 0–4)
LYMPHOCYTES ABSOLUTE COUNT: 2.9 10*9/L (ref 1.5–5.0)
LYMPHOCYTES RELATIVE PERCENT: 35.6 %
MEAN CORPUSCULAR HEMOGLOBIN CONC: 33.8 g/dL (ref 31.0–37.0)
MEAN CORPUSCULAR HEMOGLOBIN: 33 pg (ref 26.0–34.0)
MEAN CORPUSCULAR VOLUME: 97.6 fL (ref 80.0–100.0)
MONOCYTES ABSOLUTE COUNT: 0.5 10*9/L (ref 0.2–0.8)
MONOCYTES RELATIVE PERCENT: 6.1 %
NEUTROPHILS ABSOLUTE COUNT: 4.2 10*9/L (ref 2.0–7.5)
NEUTROPHILS RELATIVE PERCENT: 51.5 %
PLATELET COUNT: 217 10*9/L (ref 150–440)
RED BLOOD CELL COUNT: 5.33 10*12/L (ref 4.50–5.90)
WBC ADJUSTED: 8.1 10*9/L (ref 4.5–11.0)

## 2017-06-09 LAB — BLOOD UREA NITROGEN: Urea nitrogen:MCnc:Pt:Ser/Plas:Qn:: 10

## 2017-06-09 LAB — BASOPHILS ABSOLUTE COUNT: Lab: 0.1

## 2017-06-09 LAB — CREATINE KINASE TOTAL: Creatine kinase:CCnc:Pt:Ser/Plas:Qn:: 231

## 2017-06-09 LAB — AST (SGOT): Aspartate aminotransferase:CCnc:Pt:Ser/Plas:Qn:: 30

## 2017-06-09 LAB — ALT (SGPT): Alanine aminotransferase:CCnc:Pt:Ser/Plas:Qn:: 50

## 2017-06-09 LAB — ALBUMIN: Albumin:MCnc:Pt:Ser/Plas:Qn:: 4.5

## 2017-06-09 MED ORDER — ADALIMUMAB 40 MG/0.8 ML SUBCUTANEOUS SYRINGE KIT: each | 0 refills | 0 days

## 2017-06-09 MED ORDER — ADALIMUMAB 40 MG/0.8 ML SUBCUTANEOUS SYRINGE KIT
SUBCUTANEOUS | 2 refills | 0.00000 days | Status: CP
Start: 2017-06-09 — End: 2017-06-09

## 2017-06-09 NOTE — Unmapped (Signed)
Please increase Humira to once weekly   Please see your EYE doctor since you are plaquenil. (can stop it but if it causes you to notice more pain please restart it)

## 2017-06-09 NOTE — Unmapped (Signed)
Assessment/Recommendations:   Joseph Sandoval is a 47 y.o. Caucasian male with history of prior polysubstance abuse, Hep C s/p Harvoni (fibroscan F1), HTN and neuropathy who presents for follow up of seropositive (+RF, +anti-CCP) rheumatoid arthritis.  Now the patient is on Humira as well as methotrexate subcu he continues to have flares and evidence of active synovitis on exam.  At this time I planning to increase his Humira to weekly.  She has also been on Plaquenil 200 twice a day and does not note if it is benefiting his joints.  I asked him to discontinue it and if he notices worsening of symptoms he can resume it.        1. seropositive nonerosive RA   - labs today CBC w/ diff, BUN/Cr, LFTs  -We will get x-rays of hands bilateral today.  - will resume methotrexate 25 mg sq weekly +folic acid daily   -Discontinue Plaquenil and can agree out if symptoms worsen.  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    2. Immunizations   - UTD with pneumonia vaccines   - UTD on flu vaccine    RTC in 4 months or sooner prn.     Patient was discussed with attending physician, Dr. Sullivan Lone  HPI:  Joseph Sandoval is a 47 y.o. Caucasian male with history of prior polysubstance abuse, Hep C s/p Harvoni, HTN and neuropathy who presents for follow up of seropositive (+RF, +anti-CCP) rheumatoid arthritis.        Interm History:  Patient is here for follow up and has stopped it for surgery which was in Feb.  Patient notes pain in his hands and shooting pain in his feet at night.  He is complaint on the Humira but notices his pain 5-7 days prior to taking the next dose.  He is also on Plaquenil and and has noted no significant changes in his joints while being on this medication..    Other wise, denies any rashes or lesions.        Disease History:    He was initially referred 05/2015 after hepatology checked RF which +.  He had synovitis of MCPs, PIPs, wrists at the time and anti-CCP low positive.  He has hx of chronic back pain and opioid dependence.  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 He was started on MTX after clarifying with hepatology (fibroscan F1 only).  He was seen for follow up 08/2015 and transitioned from po to sq methotrexate now on 25 mg weekly.  He tolerates well without GI side effects.  At last visit 11/2015, he had only partial improvement so discussed adding TNF-I but this was deferred given upcoming THA.  He also underwent carpal tunnel release on the left 03/2016 due to paresthesias without improvement.   He had an MRI spine and referred to ortho spine due to concern for cervical radiculopathy as cause of his symptoms/paresthesias.    He was off off  MTX for about 1 month since he started getting nodules on his back and stopped. During his last visit in 6/18 pt was restarted on MTX 25 mg and also HCQ was added.   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  Alcoholism (CMS-HCC)    ??? Anxiety    ??? Arthritis    ??? Asthma    ??? Atrial fibrillation (CMS-HCC)    ??? Bipolar affective (CMS-HCC)    ??? Bronchitis    ??? Depression    ??? Headache    ??? Hepatitis C    ??? Hyperlipidemia    ??? Hypertension    ??? Infectious viral hepatitis    ??? Joint pain    ??? Lung disease    ??? Polysubstance abuse (CMS-HCC)    ??? RA (rheumatoid arthritis) (CMS-HCC)    ??? Seizures (CMS-HCC)    ??? Shoulder injury    ??? Sleep apnea, obstructive    ??? Sleeping difficulties      Allergies:  Lisinopril    Medications:     Current outpatient prescriptions:   ???  amLODIPine (NORVASC) 5 MG tablet, Take 5 mg by mouth daily., Disp: , Rfl:   ???  aspirin (ECOTRIN) 81 MG tablet, Take 81 mg by mouth daily., Disp: , Rfl:   ???  baclofen (LIORESAL) 10 MG tablet, Take 10 mg by mouth Three (3) times a day. As needed, Disp: , Rfl:   ???  benztropine (COGENTIN) 0.5 MG tablet, Take 0.5 mg by mouth Two (2) times a day., Disp: , Rfl:   ???  carBAMazepine (TEGRETOL) 200 mg tablet, Take 200 mg by mouth Two (2) times a day., Disp: , Rfl:   ???  celecoxib (CELEBREX) 100 MG capsule, Take 100 mg by mouth Two (2) times a day. , Disp: , Rfl:   ???  diltiazem (CARDIZEM CD) 120 MG 24 hr capsule, Take 1 capsule (120 mg total) by mouth daily., Disp: 30 capsule, Rfl: 6  ???  gabapentin (NEURONTIN) 400 MG capsule, Take 400 mg by mouth Three (3) times a day., Disp: , Rfl:   ???  loratadine (CLARITIN) 10 mg tablet, Take 10 mg by mouth daily., Disp: , Rfl:   ???  metoprolol tartrate (LOPRESSOR) 25 MG tablet, Take 25 mg by mouth Two (2) times a day., Disp: , Rfl:   ???  naltrexone (DEPADE) 50 mg tablet, Take 50 mg by mouth nightly., Disp: , Rfl:   ???  ranitidine (ZANTAC) 150 MG tablet, Take 150 mg by mouth Two (2) times a day., Disp: , Rfl:   ???  risperiDONE (RISPERDAL) 2 MG tablet, Take 2 mg by mouth nightly., Disp: , Rfl:   ???  simvastatin (ZOCOR) 20 MG tablet, Take 20 mg by mouth nightly., Disp: , Rfl:   ???  [DISCONTINUED] hydrochlorothiazide (HYDRODIURIL) 25 MG tablet, Take 25 mg by mouth daily., Disp: , Rfl:   ???  albuterol sulfate (PROAIR RESPICLICK) 90 mcg/actuation AePB, Inhale., Disp: , Rfl:   ???  glucosamine HCl 500 mg Tab, Take 1 tablet by mouth Two (2) times a day., Disp: , Rfl:     Surgical History:  Past Surgical History:   Procedure Laterality Date   ??? CARDIAC SURGERY      2008 shock therapy   ??? PR ALLOGRAFT FOR SPINE SURGERY ONLY MORSELIZED N/A 03/31/2017    Procedure: Allograft For Spine Surgery Only; Morselized;  Surgeon: Nemiah Commander, MD;  Location[facility];  Service: Ortho Spine   ??? PR ANTERIOR INSTRUMENTATION 2-3 VERTEBRAL SEGMENTS N/A 03/31/2017    Procedure: Ant Instrum; 2 To 3 Verteb Segmt Cervical;  Surgeon: Nemiah Commander, MD;  Location[facility];  Service: Ortho Spine   ??? PR ARTHRODESIS ANT INTERBODY INC  DISCECTOMY, CERVICAL BELOW C2 N/A 03/31/2017    Procedure: Arthrodes, Ant Intrbdy, Incl Disc Spc Prep, Discect, Osteophyt/Decompress Spinl Crd &/Or Nrv Rt, Crv Blo C2;  Surgeon: Nemiah Commander, MD;  Location: [facility] OR [facility];  Service: Ortho Spine   ??? PR ARTHRODESIS ANT INTERBODY INC DISCECTOMY, CERVICAL BELOW C2 EACH ADDL N/A 03/31/2017    Procedure: Arthrod, Ant Intbdy, Incl Disc Spc Prep/Disctmy/Ostephyt/Decmpr Spnl Crd/Nrv Rt; Cerv Belo C2, Ea Add`L Spc;  Surgeon: Nemiah Commander, MD;  Location: [facility] OR [facility];  Service: Ortho Spine   ??? PR AUTOGRAFT SPINE SURGERY LOCAL FROM SAME INCISION N/A 03/31/2017    Procedure: Autograft/Spine Surg Only (W/Harvest Graft); Local (Eg, Rib/Spinous Proc, Ples Specter) Obtain From Same Incis;  Surgeon: Nemiah Commander, MD;  [facility];  Service: Ortho Spine   ??? PR INSJ BIOMCHN DEV INTERVERTEBRAL DSC SPC W/ARTHRD N/A 03/31/2017    Procedure: Insert Interbody Biomechanical Device(S) With Integral Anterior Instrument For Device Anchoring, When Performed, To Intervertebral Disc Space In Conjunction With Interbody Arthrodesis, Each Interspace x2;  Surgeon: Nemiah Commander, MD;  Location: [facility] OR [facility];  Service: Ortho Spine   ??? PR IONM 1 ON 1 IN OR W/ATTENDANCE EACH 15 MINUTES N/A 03/31/2017    Procedure: Continuous Intraoperative Neurophysiology Monitoring In Or;  Surgeon: Nemiah Commander, MD;  Location: [facility] OR [facility];  Service: Ortho Spine   ??? PR NERVOUS SYSTEM SURGERY UNLISTED N/A 03/21/2016    Procedure: UNLISTED PROC NERVOUS SYSTEM;  Surgeon: Coralee Pesa, MD;  Location: [facility];  Service: Orthopedics   ??? PR RAD EXCIS WRIST SYNOV/TENDON,FLEXOR Left 03/21/2016    Procedure: RAD EXC BURSA WRIST TENDON SHEATHS; FLEXORS;  Surgeon: Coralee Pesa, MD;  Location: [facility] OR [facility];  Service: Orthopedics   ??? PR REVISE MEDIAN N/CARPAL TUNNEL SURG Left 10/08/2016    Procedure: NEUROPLASTY AND/OR TRANSPOSITION; MEDIAN NERVE AT CARPAL TUNNEL--MOD 22--REVISION;  Surgeon: Garnett Farm, MD;  [facility];  Service: Ortho Hand   ??? PR REVISE MEDIAN N/CARPAL TUNNEL SURG Right 10/22/2016    Procedure: NEUROPLASTY AND/OR TRANSPOSITION; MEDIAN NERVE AT CARPAL TUNNEL;  Surgeon: Garnett Farm, MD;  [facility];  Service: Ortho Hand   ??? PR TOTAL HIP ARTHROPLASTY Right 12/28/2015    Procedure: ARTHROPLASTY, ACETABULAR & PROXIMAL FEMORAL PROSTHETIC REPLACEMENT (TOTAL HIP), W/WO AUTOGRAFT/ALLOGRAFT;  Surgeon: Malka So, MD;  Location: [facility] OR [facility];  Service: Orthopedics   ??? PR WRIST ARTHROSCOP,RELEASE XVERS LIG Left 03/21/2016    Procedure: LEFT ENDOSCOPIC CARPAL TUNNEL RELEASE;  Surgeon: Coralee Pesa, MD;  Location: [facility] OR [facility];  Service: Orthopedics     Social History:  Social History     Tobacco Use   ??? Smoking status: Current Every Day Smoker     Packs/day: 1.00     Years: 30.00     Pack years: 30.00     Types: Cigarettes     Start date: 05/19/1984   ??? Smokeless tobacco: Former Neurosurgeon     Types: Snuff   Substance Use Topics   ??? Alcohol use: No     Alcohol/week: 0.0 oz     Comment: recovering   ??? Drug use: No     Family History:  Arthritis - mother, father not sure what kind     Objective   Vitals:    06/09/17 0920   Weight: (!) 117.2 kg (258 lb 6.1 oz)  Physical Exam  General:   Patient is well appearing, and does not appear to be in any acute distress   Eyes:   PERRLA, EOMI, and sclera aniteric.   ENT:   MMM. Oropharhynx without any erythema or exudate.  No oropharyngeal exudates or ulcerations    Neck:   Supple, no JVD, no cervical lymphadenopathy   Cardiovascular:  normal rate, regular rhythm.  S1 and S2 normal, without any murmur, rub, or gallop.   Pulmonary:  Clear to auscultation bilaterally, without                           Wheezes/crackles/rhonchi.  Good air movement. Normal work of breathing.   Skin:    No rash.lesions/breakdown. Normal turgor and elasticity.    No Raynaud???s phenomenon or digital ulcers.    Psychiatry:   Mood and affect appropriate and congruent.    GI:   Normoactive bowel sounds, abdomen soft, non-tender and not distended, no hepatosplenomegaly or masses. No rebound or guarding.    Extremities:   Warm and well perfused. No bilateral cyanosis, clubbing or edema.   Musculo Skeletal:   Tenderness and swelling noted in  bilateral 2,3,4 MCPs, hand grip strength has been reduced.  T no elbow contractures, normal ROM b/l shoulders.  Knee crepitus.  Reduced hip flexion due to low back pain, limited external rotation b/l hips.  No synovitis of the ankles or toes.     Neurological:  Cranial nerves III-XII grossly intact.  Strength 5/5        Test Results  No visits with results within 4 Week(s) from this visit.   Latest known visit with results is:   Appointment on 03/19/2017   Component Date Value   ??? Blood Type 03/19/2017 B POS    ??? Antibody Screen 03/19/2017 NEG    ??? PT 03/19/2017 11.1    ??? INR 03/19/2017 0.97    ??? APTT 03/19/2017 31.9    ??? Heparin Correlation 03/19/2017 <0.2    ??? Sodium 03/19/2017 139    ??? Potassium 03/19/2017 4.6    ??? Chloride 03/19/2017 102    ??? CO2 03/19/2017 30.0    ??? BUN 03/19/2017 12    ??? Creatinine 03/19/2017 0.91    ??? BUN/Creatinine Ratio 03/19/2017 13    ??? EGFR MDRD Non Af Amer 03/19/2017 >=60    ??? EGFR MDRD Af Amer 03/19/2017 >=60    ??? Anion Gap 03/19/2017 7*   ??? Glucose 03/19/2017 97    ??? Calcium 03/19/2017 9.8    ??? WBC 03/19/2017 8.5    ??? RBC 03/19/2017 5.17    ??? HGB 03/19/2017 17.3    ??? HCT 03/19/2017 51.5    ??? MCV 03/19/2017 99.6    ??? [facility] 03/19/2017 33.5    ??? MCHC 03/19/2017 33.7    ??? RDW 03/19/2017 13.8    ??? MPV 03/19/2017 7.9    ??? Platelet 03/19/2017 274    ??? HIST VERIF FORM 03/19/2017 30 DAYS

## 2017-06-17 NOTE — Unmapped (Signed)
I reviewed the case with the fellow but did not see the patient.  I agree with the assessment and plan as documented in the fellow's note.    Jahzir Strohmeier, MD, MSCI  Assistant Professor of Medicine  Department of Medicine/Division of Rheumatology  [employer] [facility]  [phone] clinic phone  [phone] clinic secure fax

## 2017-06-18 NOTE — Unmapped (Signed)
[facility] Refill Coordination Note    Specialty Medication(s) to be Shipped:   Inflammatory Disorders: methotrexate vials    Other medication(s) to be shipped: albuterol inhaler     Joseph Sandoval, DOB: December 03, 1970  Phone: [phone] (home) [phone] (work)  Shipping Address: [address]   [place] [address]    All above HIPAA information was verified with patient.     Completed refill call assessment today to schedule patient's medication shipment from the [facility] [phone]).       Specialty medication(s) and dose(s) confirmed: Regimen is correct and unchanged.   Changes to medications: Joseph Sandoval reports no changes reported at this time.  Changes to insurance: No  Questions for the pharmacist: No    The patient will receive an FSI print out for each medication shipped and additional FDA Medication Guides as required.  Patient education from [facility] or [name] may also be included in the shipment.    DISEASE-SPECIFIC INFORMATION        For Rheumatology patients: Next dose of methotrexate  from this shipment due on 07/04/17    ADHERENCE     Medication Adherence    Patient reported X missed doses in the last month:  0  Specialty Medication:  methotrexate  Patient is on additional specialty medications:  No  Patient is on more than two specialty medications:  No  Any gaps in refill history greater than 2 weeks in the last 3 months:  no  Demonstrates understanding of importance of adherence:  yes  Informant:  patient  Adherence tools used:  calendar  Confirmed plan for next specialty medication refill:  delivery by pharmacy  Refills needed for supportive medications:  not needed          Refill Coordination    Has the Patients' Contact Information Changed:  No  Is the Shipping Address Different:  No         SHIPPING     Shipping address confirmed in FSI.     Delivery Scheduled: Yes, Expected medication delivery date: 06/24/17 via UPS or courier.     Renette Butters   [facility] Specialty Technician

## 2017-06-23 MED FILL — ALBUTEROL HFA/90MCG/AER: ALBUTEROL HFA/90MCG/AER | 25 days supply | Qty: 1 | Fill #4

## 2017-06-23 MED FILL — METHOTREXATE/25MG/ML/SOLN: METHOTREXATE/25MG/ML/SOLN | 28 days supply | Qty: 2 | Fill #0

## 2017-07-15 ENCOUNTER — Ambulatory Visit: Admit: 2017-07-15 | Discharge: 2017-07-15

## 2017-07-15 ENCOUNTER — Ambulatory Visit: Admit: 2017-07-15 | Discharge: 2017-07-15 | Attending: Orthopaedic Surgery | Primary: Orthopaedic Surgery

## 2017-07-15 DIAGNOSIS — Z981 Arthrodesis status: Principal | ICD-10-CM

## 2017-07-15 DIAGNOSIS — M5412 Radiculopathy, cervical region: Secondary | ICD-10-CM

## 2017-07-15 NOTE — Unmapped (Signed)
??   Your diagnosis: Cervical radiculopathy, Cervical myelopathy and Cervical Fusion C5-7 ACDF 03/31/17    ?? Recommendations/Plan  ?? Medications: Take over-the-counter medications as needed and as tolerated  ?? If you don't have liver disease, the safest medication for pain is acetaminophen (Tylenol).  Take 1000mg  (2 extra strength tabs) at a time for pain relief.  Do not take more than 3000mg  per day.  ?? Try Salonpas Lidocaine 4% patches (https://www.cook.com/)  ?? Activities as tolerated.  ?? Imaging studies: MRI of the Cervical spine was ordered. Medical necessity: Radiographs have been performed/ordered. We suspect cord compression.  ?? Follow-up: After imaging studies.     ?? Avoid nicotine/tobacco  ?? Minimize alcohol intake  ?? Maintain a healthy weight    ?? Stay physically active and exercise regularly as tolerated ([email or web address])  ?? Maintain good sleeping habits    ?? We want to improve, and you can help.  You may receive a survey asking you about your visit.  Please take a few minutes to complete the survey.  We will use your feedback to make improvements.    ?? Contact our team electronically via MyChart message to [facility] Clinical Staff.    ?? Contact our team at [phone] or [phone] with any questions/concerns.    ?? Please visit and like [place] on our Facebook page to learn more about our services.   [email or web address]

## 2017-07-16 NOTE — Unmapped (Signed)
I, Nemiah Commander, MD, saw and evaluated the patient, participating in the key elements of the service.  I discussed the findings, assessment and plan with the resident and agree with resident???s findings and plan as documented in the resident's note.

## 2017-07-16 NOTE — Unmapped (Signed)
ORTHOPAEDIC SPINE CLINIC NOTE       Joseph Commander, MD  Associate Professor  [email or web address]  [phone]        Patient Name:Joseph Sandoval  MRN: [id number]  DOB: 07/17/1970    Date: 07/15/2017    PCP: [facility]    ASSESSMENT:     Joseph Sandoval  47 y.o. 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  Neuropathy. Afib. COPD. OSA. Bipolar. Tob. RA. HepC.  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 No c-radic.  S/p C5-7 ACDF (Monday 03/31/17 at [facility])      PLAN:     ?? Medications: Take over-the-counter medications as needed and as tolerated  ?? If you don't have liver disease, the safest medication for pain is acetaminophen (Tylenol).  Take 1000mg  (2 extra strength tabs) at a time for pain relief.  Do not take more than 3000mg  per day.  ?? Try Salonpas Lidocaine 4% patches (https://www.cook.com/)  ?? Activities as tolerated.  ?? Imaging studies: MRI of the Cervical spine was ordered. Medical necessity: Radiographs have been performed/ordered. We suspect cord compression.  ?? Follow-up: After imaging studies.      SUBJECTIVE:     Chief Complaint:  L hand numbness    History of Present Illness:        07/15/17 1234   PainSc:   7     Patient continues to have issues with bilateral hand numbness, worse on L hand and worsening since last visit. Has been applying for disability with Dr. Dartha Sandoval.      Review of Systems:  Fever/chills: denies    Medical History   Past Medical History:   Diagnosis Date   ??? Alcoholism (CMS-HCC)    ??? Anxiety    ??? Arthritis    ??? Asthma    ??? Atrial fibrillation (CMS-HCC)    ??? Bipolar affective (CMS-HCC)    ??? Bronchitis    ??? Depression    ??? Headache    ??? Hepatitis C    ??? Hyperlipidemia    ??? Hypertension    ??? Infectious viral hepatitis    ??? Joint pain    ??? Lung disease    ??? Polysubstance abuse (CMS-HCC)    ??? RA (rheumatoid arthritis) (CMS-HCC)    ??? Seizures (CMS-HCC)    ??? Shoulder injury    ??? Sleep apnea, obstructive    ??? Sleeping difficulties       Surgical History   He  has a past surgical history that includes Cardiac surgery; pr total hip arthroplasty (Right, 12/28/2015); pr wrist arthroscop,release xvers lig (Left, 03/21/2016); pr nervous system surgery unlisted (N/A, 03/21/2016); pr rad excis wrist synov/tendon,flexor (Left, 03/21/2016); pr revise median n/carpal tunnel surg (Left, 10/08/2016); pr revise median n/carpal tunnel surg (Right, 10/22/2016); pr arthrodesis ant interbody inc discectomy, cervical below c2 (N/A, 03/31/2017); pr arthrodesis ant interbody inc discectomy, cervical below c2 each addl (N/A, 03/31/2017); pr anterior instrumentation 2-3 vertebral segments (N/A, 03/31/2017); pr insj biomchn dev intervertebral dsc spc w/arthrd (N/A, 03/31/2017); pr autograft spine surgery local from same incision (N/A, 03/31/2017); pr allograft for spine surgery only morselized (N/A, 03/31/2017); and pr ionm 1 on 1 in or w/attendance each 15 minutes (N/A, 03/31/2017).     Allergies   Lisinopril   Medications   Current Outpatient Medications   Medication Sig  Dispense Refill   ??? simvastatin (ZOCOR) 20 MG tablet Take 40 mg by mouth nightly.      ??? acetaminophen (TYLENOL) 500 MG tablet Take 1,000 mg by mouth every six (6) hours as needed for pain.     ??? adalimumab (HUMIRA) 40 mg/0.8 mL injection Inject 0.8 mL (40 mg total) under the skin every seven (7) days. 1 each 2   ??? albuterol (PROAIR HFA) 90 mcg/actuation inhaler Inhale 2 puffs every six (6) hours as needed for wheezing. 3 Inhaler 6   ??? amLODIPine (NORVASC) 5 MG tablet Take 10 mg by mouth daily.      ??? benztropine (COGENTIN) 0.5 MG tablet Take 0.5 mg by mouth Two (2) times a day.     ??? carBAMazepine (TEGRETOL) 200 mg tablet Take 100 mg by mouth Two (2) times a day.      ??? gabapentin (NEURONTIN) 400 MG capsule Take 400 mg by mouth Three (3) times a day.     ??? hydroCHLOROthiazide (HYDRODIURIL) 25 MG tablet Take 25 mg by mouth daily.  4   ??? hydroxychloroquine (PLAQUENIL) 200 mg tablet Take 1 tablet (200 mg total) by mouth Two (2) times a day. (Patient not taking: Reported on 07/15/2017) 60 tablet 6   ??? methotrexate 25 mg/mL injection solution Inject 1 mL (25 mg total) under the skin once a week. 4 mL 3   ??? metoprolol tartrate (LOPRESSOR) 25 MG tablet Take 50 mg by mouth Two (2) times a day.      ??? mometasone-formoterol 200-5 mcg/actuation HFAA Inhale 2 puffs Two (2) times a day. 36 g 6   ??? ranitidine (ZANTAC) 150 MG tablet Take 150 mg by mouth Two (2) times a day.     ??? risperiDONE (RISPERDAL) 2 MG tablet Take 2 mg by mouth nightly.     ??? sertraline (ZOLOFT) 50 MG tablet Take 100 mg by mouth daily.   1   ??? syringe with needle (TUBERCULIN SYRINGE) 1 mL 25 gauge x 5/8 Syrg 1 Units by Miscellaneous route every seven (7) days. To be used with methotrexate 100 Syringe 3     No current facility-administered medications for this visit.       Social History   Social History     Social History Narrative   ??? Not on file     He  reports that he has been smoking cigarettes.  He started smoking about 33 years ago. He has a 30.00 pack-year smoking history. He has quit using smokeless tobacco. His smokeless tobacco use included snuff. He reports that he does not drink alcohol or use drugs.   The history recorded in the table above was reviewed.     OBJECTIVE:     PHYSICAL EXAM (6+pts):  Vitals: BP 130/81  - Pulse 71  - Temp 36.4 ??C  - Ht 172.7 cm (5' 7.99)  - Wt (!) 117.4 kg (258 lb 14.4 oz)  - BMI 39.37 kg/m??   Appearance: well-nourished and no acute distress   Affect: alert and cooperative  Gait: normal  Extremities: Strength unchanged in bilat LE.  Strength unchanged in bilat UE.  Neurologic: Sensation to light touch diminished in L hand in median n distribution   Skin: No cyanosis, clubbing, and normal skin in bilat hands.  Incision healed.     MEDICAL DECISION MAKING    Test Results:  Imaging:   None available  Discussion:  ?? Clinical findings, diagnostic/treatment options, and plan were discussed with the patient.  ??  Imaging - Discussed pros/cons of advanced imaging options.    E&M Coding:  ?? 16109 - Global     cc: [place], [facility] Factor*, [facility]

## 2017-07-16 NOTE — Unmapped (Signed)
Patient has 2 vials of methotrexate on hand-says he hasn't missed any doses  Declined delivery of methotrexate at first then when I asked if he just wanted to get everything altogether he said well yea ya can send it all  Sending it all 07/22/17    [facility] Refill Coordination Note    Specialty Medication(s) to be Shipped:   Inflammatory Disorders: methotrexate vials    Other medication(s) to be shipped: folic acid, syringes, albuterol inhalers     Joseph Sandoval, DOB: 1970/03/03  Phone: [phone] (home) [phone] (work)  Shipping Address: [address]   [place] [address]    All above HIPAA information was verified with patient.     Completed refill call assessment today to schedule patient's medication shipment from the [facility] [phone]).       Specialty medication(s) and dose(s) confirmed: Regimen is correct and unchanged.   Changes to medications: [name] reports no changes reported at this time.  Changes to insurance: No  Questions for the pharmacist: No    The patient will receive an FSI print out for each medication shipped and additional FDA Medication Guides as required.  Patient education from [place] or [name] may also be included in the shipment.    DISEASE-SPECIFIC INFORMATION        For Rheumatology patients: Next dose of methotrexate from this shipment due on 7/5?    ADHERENCE     Medication Adherence    Patient reported X missed doses in the last month:  0  Specialty Medication:  methotrexate vials  Patient is on additional specialty medications:  No  Patient is on more than two specialty medications:  No  Any gaps in refill history greater than 2 weeks in the last 3 months:  no  Demonstrates understanding of importance of adherence:  yes  Informant:  patient  Reliability of informant:  reliable  Adherence tools used:  calendar  Confirmed plan for next specialty medication refill:  delivery by pharmacy  Refills needed for supportive medications:  not needed          Refill Coordination    Has the Patients' Contact Information Changed:  No  Is the Shipping Address Different:  No         SHIPPING     Shipping address confirmed in FSI.     Delivery Scheduled: Yes, Expected medication delivery date: 07/22/17 via UPS or courier.     Renette Butters   [facility] Specialty Technician

## 2017-07-21 MED FILL — BD TB 1mL/25G 5/8 INCH/SYR: BD TB 1mL/25G 5/8 INCH/SYR | 84 days supply | Qty: 12 | Fill #1

## 2017-07-21 MED FILL — METHOTREXATE/25MG/ML/SOLN: METHOTREXATE/25MG/ML/SOLN | 28 days supply | Qty: 2 | Fill #1

## 2017-07-21 MED FILL — FOLIC ACID/1MG/TABS: FOLIC ACID/1MG/TABS | 90 days supply | Qty: 90 | Fill #1

## 2017-07-21 MED FILL — ALBUTEROL HFA/90MCG/AER: ALBUTEROL HFA/90MCG/AER | 25 days supply | Qty: 1 | Fill #5

## 2017-07-23 ENCOUNTER — Observation Stay (HOSPITAL_COMMUNITY)
Admission: EM | Admit: 2017-07-23 | Discharge: 2017-07-24 | Disposition: A | Discharge status: Home / Self Care | Payer: Self-pay | Attending: General Surgery | Admitting: General Surgery

## 2017-07-23 ENCOUNTER — Emergency Department (HOSPITAL_COMMUNITY): Payer: Self-pay

## 2017-07-23 ENCOUNTER — Emergency Department (HOSPITAL_COMMUNITY): Payer: Self-pay | Admitting: Anesthesiology

## 2017-07-23 ENCOUNTER — Encounter (HOSPITAL_COMMUNITY): Payer: Self-pay | Admitting: Emergency Medicine

## 2017-07-23 ENCOUNTER — Other Ambulatory Visit: Payer: Self-pay

## 2017-07-23 ENCOUNTER — Encounter (HOSPITAL_COMMUNITY)
Admission: EM | Disposition: A | Discharge status: Home / Self Care | Payer: Self-pay | Source: Home / Self Care | Attending: Emergency Medicine

## 2017-07-23 DIAGNOSIS — R079 Chest pain, unspecified: Secondary | ICD-10-CM | POA: Diagnosis present

## 2017-07-23 DIAGNOSIS — Z79899 Other long term (current) drug therapy: Secondary | ICD-10-CM | POA: Insufficient documentation

## 2017-07-23 DIAGNOSIS — F419 Anxiety disorder, unspecified: Secondary | ICD-10-CM | POA: Insufficient documentation

## 2017-07-23 DIAGNOSIS — M069 Rheumatoid arthritis, unspecified: Secondary | ICD-10-CM | POA: Diagnosis present

## 2017-07-23 DIAGNOSIS — K3580 Unspecified acute appendicitis: Principal | ICD-10-CM | POA: Insufficient documentation

## 2017-07-23 DIAGNOSIS — K3589 Other acute appendicitis without perforation or gangrene: Secondary | ICD-10-CM

## 2017-07-23 DIAGNOSIS — R569 Unspecified convulsions: Secondary | ICD-10-CM

## 2017-07-23 DIAGNOSIS — F1010 Alcohol abuse, uncomplicated: Secondary | ICD-10-CM

## 2017-07-23 DIAGNOSIS — Z6838 Body mass index (BMI) 38.0-38.9, adult: Secondary | ICD-10-CM | POA: Insufficient documentation

## 2017-07-23 DIAGNOSIS — F319 Bipolar disorder, unspecified: Secondary | ICD-10-CM | POA: Diagnosis present

## 2017-07-23 DIAGNOSIS — F17210 Nicotine dependence, cigarettes, uncomplicated: Secondary | ICD-10-CM | POA: Insufficient documentation

## 2017-07-23 DIAGNOSIS — F1910 Other psychoactive substance abuse, uncomplicated: Secondary | ICD-10-CM | POA: Diagnosis present

## 2017-07-23 DIAGNOSIS — B1920 Unspecified viral hepatitis C without hepatic coma: Secondary | ICD-10-CM | POA: Insufficient documentation

## 2017-07-23 DIAGNOSIS — I10 Essential (primary) hypertension: Secondary | ICD-10-CM | POA: Diagnosis present

## 2017-07-23 DIAGNOSIS — I4891 Unspecified atrial fibrillation: Secondary | ICD-10-CM | POA: Diagnosis present

## 2017-07-23 DIAGNOSIS — F17200 Nicotine dependence, unspecified, uncomplicated: Secondary | ICD-10-CM

## 2017-07-23 DIAGNOSIS — K37 Unspecified appendicitis: Secondary | ICD-10-CM | POA: Diagnosis present

## 2017-07-23 DIAGNOSIS — D729 Disorder of white blood cells, unspecified: Secondary | ICD-10-CM

## 2017-07-23 DIAGNOSIS — G4733 Obstructive sleep apnea (adult) (pediatric): Secondary | ICD-10-CM | POA: Insufficient documentation

## 2017-07-23 DIAGNOSIS — K219 Gastro-esophageal reflux disease without esophagitis: Secondary | ICD-10-CM | POA: Insufficient documentation

## 2017-07-23 DIAGNOSIS — F329 Major depressive disorder, single episode, unspecified: Secondary | ICD-10-CM | POA: Diagnosis present

## 2017-07-23 HISTORY — PX: LAPAROSCOPIC APPENDECTOMY: SHX408

## 2017-07-23 LAB — CBC
HCT: 49.9 % (ref 39.0–52.0)
HCT: 50.4 % (ref 39.0–52.0)
HEMOGLOBIN: 17.5 g/dL — AB (ref 13.0–17.0)
Hemoglobin: 17.3 g/dL — ABNORMAL HIGH (ref 13.0–17.0)
MCH: 33.5 pg (ref 26.0–34.0)
MCH: 34 pg (ref 26.0–34.0)
MCHC: 34.7 g/dL (ref 30.0–36.0)
MCHC: 34.7 g/dL (ref 30.0–36.0)
MCV: 96.5 fL (ref 78.0–100.0)
MCV: 97.9 fL (ref 78.0–100.0)
PLATELETS: 251 10*3/uL (ref 150–400)
Platelets: 242 10*3/uL (ref 150–400)
RBC: 5.17 MIL/uL (ref 4.22–5.81)
RBC: 5.15 MIL/uL (ref 4.22–5.81)
RDW: 13.6 % (ref 11.5–15.5)
RDW: 13.6 % (ref 11.5–15.5)
WBC: 13.7 10*3/uL — ABNORMAL HIGH (ref 4.0–10.5)
WBC: 13.6 10*3/uL — ABNORMAL HIGH (ref 4.0–10.5)

## 2017-07-23 LAB — LIPASE, BLOOD: LIPASE: 28 U/L (ref 11–51)

## 2017-07-23 LAB — COMPREHENSIVE METABOLIC PANEL
ALT: 68 U/L — ABNORMAL HIGH (ref 17–63)
AST: 35 U/L (ref 15–41)
Albumin: 4.2 g/dL (ref 3.5–5.0)
Alkaline Phosphatase: 57 U/L (ref 38–126)
Anion gap: 11 (ref 5–15)
BUN: 7 mg/dL (ref 6–20)
CO2: 24 mmol/L (ref 22–32)
CREATININE: 0.96 mg/dL (ref 0.61–1.24)
Calcium: 9.6 mg/dL (ref 8.9–10.3)
Chloride: 100 mmol/L — ABNORMAL LOW (ref 101–111)
Glucose, Bld: 105 mg/dL — ABNORMAL HIGH (ref 65–99)
POTASSIUM: 3.7 mmol/L (ref 3.5–5.1)
Sodium: 135 mmol/L (ref 135–145)
Total Bilirubin: 0.7 mg/dL (ref 0.3–1.2)
Total Protein: 7.4 g/dL (ref 6.5–8.1)

## 2017-07-23 LAB — URINALYSIS, ROUTINE W REFLEX MICROSCOPIC
Bacteria, UA: NONE SEEN
Glucose, UA: NEGATIVE mg/dL
Hgb urine dipstick: NEGATIVE
Ketones, ur: NEGATIVE mg/dL
Leukocytes, UA: NEGATIVE
Nitrite: NEGATIVE
PROTEIN: 100 mg/dL — AB
Specific Gravity, Urine: 1.029 (ref 1.005–1.030)
pH: 7 (ref 5.0–8.0)

## 2017-07-23 LAB — CREATININE, SERUM
Creatinine, Ser: 1.07 mg/dL (ref 0.61–1.24)
GFR calc Af Amer: >60 mL/min (ref >60)
GFR calc non Af Amer: >60 mL/min (ref >60)

## 2017-07-23 LAB — RAPID URINE DRUG SCREEN, HOSP PERFORMED
AMPHETAMINES: NOT DETECTED
Barbiturates: NOT DETECTED
Benzodiazepines: NOT DETECTED
Cocaine: NOT DETECTED
OPIATES: NOT DETECTED
TETRAHYDROCANNABINOL: NOT DETECTED

## 2017-07-23 LAB — TROPONIN I: Troponin I: <0.03 ng/mL (ref <0.03)

## 2017-07-23 SURGERY — APPENDECTOMY, LAPAROSCOPIC
Anesthesia: General

## 2017-07-23 MED ORDER — RISPERIDONE 2 MG PO TABS
2.0000 mg | ORAL_TABLET | Freq: Every day | ORAL | Status: DC
  Administered 2017-07-23: 2 mg via ORAL
  Filled 2017-07-23: qty 1

## 2017-07-23 MED ORDER — ENOXAPARIN SODIUM 40 MG/0.4ML ~~LOC~~ SOLN
40.0000 mg | SUBCUTANEOUS | Status: DC
  Administered 2017-07-24: 40 mg via SUBCUTANEOUS
  Filled 2017-07-23: qty 0.4

## 2017-07-23 MED ORDER — OXYCODONE HCL 5 MG PO TABS: 5.0000 mg | ORAL_TABLET | Freq: Once | ORAL | Status: DC | PRN

## 2017-07-23 MED ORDER — LORAZEPAM 2 MG/ML IJ SOLN: 0.0000 mg | Freq: Four times a day (QID) | INTRAMUSCULAR | Status: DC

## 2017-07-23 MED ORDER — BENZTROPINE MESYLATE 0.5 MG PO TABS
0.5000 mg | ORAL_TABLET | Freq: Two times a day (BID) | ORAL | Status: DC
  Administered 2017-07-23 – 2017-07-24 (×2): 0.5 mg via ORAL
  Filled 2017-07-23 (×2): qty 1

## 2017-07-23 MED ORDER — ASPIRIN EC 81 MG PO TBEC
81.0000 mg | DELAYED_RELEASE_TABLET | Freq: Every day | ORAL | Status: DC
  Administered 2017-07-23 – 2017-07-24 (×2): 81 mg via ORAL
  Filled 2017-07-23 (×2): qty 1

## 2017-07-23 MED ORDER — 0.9 % SODIUM CHLORIDE (POUR BTL) OPTIME
TOPICAL | Status: DC | PRN
  Administered 2017-07-23: 1000 mL

## 2017-07-23 MED ORDER — GABAPENTIN 400 MG PO CAPS
400.0000 mg | ORAL_CAPSULE | Freq: Three times a day (TID) | ORAL | Status: DC
  Administered 2017-07-23 – 2017-07-24 (×2): 400 mg via ORAL
  Filled 2017-07-23 (×2): qty 1

## 2017-07-23 MED ORDER — FENTANYL CITRATE (PF) 100 MCG/2ML IJ SOLN: 25.0000 ug | INTRAMUSCULAR | Status: DC | PRN

## 2017-07-23 MED ORDER — HYDROMORPHONE HCL 2 MG/ML IJ SOLN
0.5000 mg | Freq: Once | INTRAMUSCULAR | Status: AC
  Administered 2017-07-23: 0.5 mg via INTRAVENOUS
  Filled 2017-07-23: qty 1

## 2017-07-23 MED ORDER — SODIUM CHLORIDE 0.9 % IV SOLN
2.0000 g | INTRAVENOUS | Status: DC
  Filled 2017-07-23: qty 20

## 2017-07-23 MED ORDER — BUPIVACAINE-EPINEPHRINE 0.25% -1:200000 IJ SOLN
INTRAMUSCULAR | Status: DC | PRN
  Administered 2017-07-23: 17 mL

## 2017-07-23 MED ORDER — ADULT MULTIVITAMIN W/MINERALS CH
1.0000 | ORAL_TABLET | Freq: Every day | ORAL | Status: DC
  Administered 2017-07-24: 1 via ORAL
  Filled 2017-07-23: qty 1

## 2017-07-23 MED ORDER — METRONIDAZOLE IN NACL 5-0.79 MG/ML-% IV SOLN
500.0000 mg | Freq: Once | INTRAVENOUS | Status: AC
  Administered 2017-07-23: 500 mg via INTRAVENOUS
  Filled 2017-07-23: qty 100

## 2017-07-23 MED ORDER — HYDRALAZINE HCL 20 MG/ML IJ SOLN: 5.0000 mg | INTRAMUSCULAR | Status: DC | PRN

## 2017-07-23 MED ORDER — THIAMINE HCL 100 MG/ML IJ SOLN: 100.0000 mg | Freq: Every day | INTRAMUSCULAR | Status: DC

## 2017-07-23 MED ORDER — ROCURONIUM BROMIDE 10 MG/ML (PF) SYRINGE
PREFILLED_SYRINGE | INTRAVENOUS | Status: DC | PRN
  Administered 2017-07-23: 50 mg via INTRAVENOUS

## 2017-07-23 MED ORDER — SUCCINYLCHOLINE CHLORIDE 20 MG/ML IJ SOLN
INTRAMUSCULAR | Status: DC | PRN
  Administered 2017-07-23: 100 mg via INTRAVENOUS

## 2017-07-23 MED ORDER — LORAZEPAM 2 MG/ML IJ SOLN: 1.0000 mg | INTRAMUSCULAR | Status: DC | PRN

## 2017-07-23 MED ORDER — ALBUTEROL SULFATE (2.5 MG/3ML) 0.083% IN NEBU
2.5000 mg | INHALATION_SOLUTION | RESPIRATORY_TRACT | Status: DC | PRN
Start: 2017-07-23 — End: 2017-07-24

## 2017-07-23 MED ORDER — FENTANYL CITRATE (PF) 250 MCG/5ML IJ SOLN
INTRAMUSCULAR | Status: AC
  Filled 2017-07-23: qty 5

## 2017-07-23 MED ORDER — NICOTINE 21 MG/24HR TD PT24
21.0000 mg | MEDICATED_PATCH | Freq: Every day | TRANSDERMAL | Status: DC
  Administered 2017-07-23: 21 mg via TRANSDERMAL
  Filled 2017-07-23: qty 1

## 2017-07-23 MED ORDER — LORAZEPAM 2 MG/ML IJ SOLN: 0.0000 mg | Freq: Two times a day (BID) | INTRAMUSCULAR | Status: DC

## 2017-07-23 MED ORDER — FOLIC ACID 1 MG PO TABS
1.0000 mg | ORAL_TABLET | Freq: Every day | ORAL | Status: DC
  Administered 2017-07-23 – 2017-07-24 (×2): 1 mg via ORAL
  Filled 2017-07-23 (×2): qty 1

## 2017-07-23 MED ORDER — DM-GUAIFENESIN ER 30-600 MG PO TB12: 1.0000 | ORAL_TABLET | Freq: Two times a day (BID) | ORAL | Status: DC | PRN

## 2017-07-23 MED ORDER — IBUPROFEN 400 MG PO TABS: 400.0000 mg | ORAL_TABLET | Freq: Four times a day (QID) | ORAL | Status: DC | PRN

## 2017-07-23 MED ORDER — AMLODIPINE BESYLATE 5 MG PO TABS
5.0000 mg | ORAL_TABLET | Freq: Every day | ORAL | Status: DC
  Administered 2017-07-23: 5 mg via ORAL
  Filled 2017-07-23: qty 1

## 2017-07-23 MED ORDER — METOPROLOL TARTRATE 50 MG PO TABS
50.0000 mg | ORAL_TABLET | Freq: Two times a day (BID) | ORAL | Status: DC
  Administered 2017-07-23 – 2017-07-24 (×2): 50 mg via ORAL
  Filled 2017-07-23 (×2): qty 1

## 2017-07-23 MED ORDER — ACETAMINOPHEN 325 MG PO TABS
650.0000 mg | ORAL_TABLET | Freq: Four times a day (QID) | ORAL | Status: DC
  Administered 2017-07-23: 650 mg via ORAL
  Filled 2017-07-23: qty 2

## 2017-07-23 MED ORDER — SERTRALINE HCL 100 MG PO TABS
100.0000 mg | ORAL_TABLET | Freq: Every day | ORAL | Status: DC
  Administered 2017-07-23: 100 mg via ORAL
  Filled 2017-07-23 (×2): qty 1

## 2017-07-23 MED ORDER — HYDRALAZINE HCL 20 MG/ML IJ SOLN: 10.0000 mg | INTRAMUSCULAR | Status: DC | PRN

## 2017-07-23 MED ORDER — ONDANSETRON HCL 4 MG/2ML IJ SOLN
4.0000 mg | Freq: Once | INTRAMUSCULAR | Status: AC
  Administered 2017-07-23: 4 mg via INTRAVENOUS
  Filled 2017-07-23: qty 2

## 2017-07-23 MED ORDER — SUGAMMADEX SODIUM 200 MG/2ML IV SOLN
INTRAVENOUS | Status: DC | PRN
  Administered 2017-07-23: 200 mg via INTRAVENOUS

## 2017-07-23 MED ORDER — ZOLPIDEM TARTRATE 5 MG PO TABS: 5.0000 mg | ORAL_TABLET | Freq: Every evening | ORAL | Status: DC | PRN

## 2017-07-23 MED ORDER — DIPHENHYDRAMINE HCL 50 MG/ML IJ SOLN: 25.0000 mg | Freq: Four times a day (QID) | INTRAMUSCULAR | Status: DC | PRN

## 2017-07-23 MED ORDER — LORAZEPAM 1 MG PO TABS: 1.0000 mg | ORAL_TABLET | Freq: Four times a day (QID) | ORAL | Status: DC | PRN

## 2017-07-23 MED ORDER — ONDANSETRON HCL 4 MG/2ML IJ SOLN: 4.0000 mg | Freq: Once | INTRAMUSCULAR | Status: DC | PRN

## 2017-07-23 MED ORDER — DEXAMETHASONE SODIUM PHOSPHATE 10 MG/ML IJ SOLN
INTRAMUSCULAR | Status: DC | PRN
  Administered 2017-07-23: 10 mg via INTRAVENOUS

## 2017-07-23 MED ORDER — OXYCODONE HCL 5 MG PO TABS
5.0000 mg | ORAL_TABLET | ORAL | Status: DC | PRN
  Administered 2017-07-23 – 2017-07-24 (×2): 10 mg via ORAL
  Filled 2017-07-23 (×2): qty 2

## 2017-07-23 MED ORDER — ALBUTEROL SULFATE HFA 108 (90 BASE) MCG/ACT IN AERS
INHALATION_SPRAY | RESPIRATORY_TRACT | Status: DC | PRN
  Administered 2017-07-23: 3 via RESPIRATORY_TRACT

## 2017-07-23 MED ORDER — LORAZEPAM 2 MG/ML IJ SOLN: 1.0000 mg | Freq: Four times a day (QID) | INTRAMUSCULAR | Status: DC | PRN

## 2017-07-23 MED ORDER — SODIUM CHLORIDE 0.9 % IV SOLN
2.0000 g | Freq: Once | INTRAVENOUS | Status: AC
  Administered 2017-07-23: 2 g via INTRAVENOUS
  Filled 2017-07-23: qty 20

## 2017-07-23 MED ORDER — PROPOFOL 10 MG/ML IV BOLUS
INTRAVENOUS | Status: AC
  Filled 2017-07-23: qty 20

## 2017-07-23 MED ORDER — PROMETHAZINE HCL 25 MG/ML IJ SOLN: 6.2500 mg | INTRAMUSCULAR | Status: DC | PRN

## 2017-07-23 MED ORDER — MIDAZOLAM HCL 2 MG/2ML IJ SOLN
INTRAMUSCULAR | Status: AC
  Filled 2017-07-23: qty 2

## 2017-07-23 MED ORDER — LACTATED RINGERS IV SOLN
INTRAVENOUS | Status: DC
  Administered 2017-07-23 (×2): via INTRAVENOUS

## 2017-07-23 MED ORDER — METRONIDAZOLE IN NACL 5-0.79 MG/ML-% IV SOLN
500.0000 mg | Freq: Three times a day (TID) | INTRAVENOUS | Status: DC
  Administered 2017-07-23 – 2017-07-24 (×2): 500 mg via INTRAVENOUS
  Filled 2017-07-23 (×2): qty 100

## 2017-07-23 MED ORDER — DIPHENHYDRAMINE HCL 25 MG PO CAPS: 25.0000 mg | ORAL_CAPSULE | Freq: Four times a day (QID) | ORAL | Status: DC | PRN

## 2017-07-23 MED ORDER — PROPOFOL 10 MG/ML IV BOLUS
INTRAVENOUS | Status: DC | PRN
  Administered 2017-07-23: 200 mg via INTRAVENOUS

## 2017-07-23 MED ORDER — HEMOSTATIC AGENTS (NO CHARGE) OPTIME
TOPICAL | Status: DC | PRN
  Administered 2017-07-23: 1

## 2017-07-23 MED ORDER — VITAMIN B-1 100 MG PO TABS
100.0000 mg | ORAL_TABLET | Freq: Every day | ORAL | Status: DC
  Administered 2017-07-23 – 2017-07-24 (×2): 100 mg via ORAL
  Filled 2017-07-23 (×2): qty 1

## 2017-07-23 MED ORDER — HYDROCHLOROTHIAZIDE 25 MG PO TABS
25.0000 mg | ORAL_TABLET | Freq: Every day | ORAL | Status: DC
  Administered 2017-07-23 – 2017-07-24 (×2): 25 mg via ORAL
  Filled 2017-07-23 (×2): qty 1

## 2017-07-23 MED ORDER — FAMOTIDINE 20 MG PO TABS
20.0000 mg | ORAL_TABLET | Freq: Every day | ORAL | Status: DC
  Administered 2017-07-23 – 2017-07-24 (×2): 20 mg via ORAL
  Filled 2017-07-23 (×2): qty 1

## 2017-07-23 MED ORDER — CARBAMAZEPINE ER 200 MG PO TB12
200.0000 mg | ORAL_TABLET | Freq: Two times a day (BID) | ORAL | Status: DC
  Administered 2017-07-23 – 2017-07-24 (×2): 200 mg via ORAL
  Filled 2017-07-23 (×2): qty 1

## 2017-07-23 MED ORDER — SODIUM CHLORIDE 0.9 % IR SOLN
Status: DC | PRN
  Administered 2017-07-23 (×2): 1000 mL

## 2017-07-23 MED ORDER — METOPROLOL TARTRATE 5 MG/5ML IV SOLN
INTRAVENOUS | Status: DC | PRN
  Administered 2017-07-23: 2 mg via INTRAVENOUS

## 2017-07-23 MED ORDER — ONDANSETRON HCL 4 MG/2ML IJ SOLN
INTRAMUSCULAR | Status: DC | PRN
  Administered 2017-07-23: 4 mg via INTRAVENOUS

## 2017-07-23 MED ORDER — MORPHINE SULFATE (PF) 2 MG/ML IV SOLN
2.0000 mg | INTRAVENOUS | Status: DC | PRN
  Administered 2017-07-24: 2 mg via INTRAVENOUS
  Filled 2017-07-23: qty 1

## 2017-07-23 MED ORDER — OXYCODONE HCL 5 MG/5ML PO SOLN: 5.0000 mg | Freq: Once | ORAL | Status: DC | PRN

## 2017-07-23 MED ORDER — FENTANYL CITRATE (PF) 250 MCG/5ML IJ SOLN
INTRAMUSCULAR | Status: DC | PRN
  Administered 2017-07-23 (×3): 50 ug via INTRAVENOUS
  Administered 2017-07-23: 100 ug via INTRAVENOUS

## 2017-07-23 MED ORDER — IOHEXOL 300 MG/ML  SOLN
100.0000 mL | Freq: Once | INTRAMUSCULAR | Status: AC | PRN
  Administered 2017-07-23: 100 mL via INTRAVENOUS

## 2017-07-23 MED ORDER — LIDOCAINE 2% (20 MG/ML) 5 ML SYRINGE
INTRAMUSCULAR | Status: DC | PRN
  Administered 2017-07-23: 100 mg via INTRAVENOUS

## 2017-07-23 MED ORDER — BUPIVACAINE-EPINEPHRINE (PF) 0.25% -1:200000 IJ SOLN
INTRAMUSCULAR | Status: AC
  Filled 2017-07-23: qty 30

## 2017-07-23 MED ORDER — NITROGLYCERIN 0.4 MG SL SUBL: 0.4000 mg | SUBLINGUAL_TABLET | SUBLINGUAL | Status: DC | PRN

## 2017-07-23 MED ORDER — ONDANSETRON 4 MG PO TBDP: 4.0000 mg | ORAL_TABLET | Freq: Four times a day (QID) | ORAL | Status: DC | PRN

## 2017-07-23 MED ORDER — SODIUM CHLORIDE 0.9 % IV SOLN
INTRAVENOUS | Status: DC
  Administered 2017-07-23: 19:00:00 via INTRAVENOUS
  Administered 2017-07-24: 100 mL/h via INTRAVENOUS

## 2017-07-23 MED ORDER — ONDANSETRON HCL 4 MG/2ML IJ SOLN: 4.0000 mg | Freq: Four times a day (QID) | INTRAMUSCULAR | Status: DC | PRN

## 2017-07-23 MED ORDER — MIDAZOLAM HCL 5 MG/5ML IJ SOLN
INTRAMUSCULAR | Status: DC | PRN
  Administered 2017-07-23: 2 mg via INTRAVENOUS

## 2017-07-23 SURGICAL SUPPLY — 44 items
APPLIER CLIP ROT 10 11.4 M/L (STAPLE)
BLADE CLIPPER SURG (BLADE) ×3 IMPLANT
CANISTER SUCT 3000ML PPV (MISCELLANEOUS) ×3 IMPLANT
CHLORAPREP W/TINT 26ML (MISCELLANEOUS) ×3 IMPLANT
CLIP APPLIE ROT 10 11.4 M/L (STAPLE) IMPLANT
CONT SPEC 4OZ CLIKSEAL STRL BL (MISCELLANEOUS) ×3 IMPLANT
COVER SURGICAL LIGHT HANDLE (MISCELLANEOUS) ×6 IMPLANT
CUTTER FLEX LINEAR 45M (STAPLE) ×3 IMPLANT
DERMABOND ADHESIVE PROPEN (GAUZE/BANDAGES/DRESSINGS) ×2
DERMABOND ADVANCED (GAUZE/BANDAGES/DRESSINGS) ×2
DERMABOND ADVANCED .7 DNX12 (GAUZE/BANDAGES/DRESSINGS) ×1 IMPLANT
DERMABOND ADVANCED .7 DNX6 (GAUZE/BANDAGES/DRESSINGS) ×1 IMPLANT
ELECT REM PT RETURN 9FT ADLT (ELECTROSURGICAL) ×3
ELECTRODE REM PT RTRN 9FT ADLT (ELECTROSURGICAL) ×1 IMPLANT
GLOVE BIO SURGEON STRL SZ8 (GLOVE) ×3 IMPLANT
GLOVE BIOGEL PI IND STRL 8 (GLOVE) ×1 IMPLANT
GLOVE BIOGEL PI INDICATOR 8 (GLOVE) ×2
GOWN STRL REUS W/ TWL LRG LVL3 (GOWN DISPOSABLE) ×2 IMPLANT
GOWN STRL REUS W/ TWL XL LVL3 (GOWN DISPOSABLE) ×1 IMPLANT
GOWN STRL REUS W/TWL LRG LVL3 (GOWN DISPOSABLE) ×4
GOWN STRL REUS W/TWL XL LVL3 (GOWN DISPOSABLE) ×2
KIT BASIN OR (CUSTOM PROCEDURE TRAY) ×3 IMPLANT
KIT TURNOVER KIT B (KITS) ×3 IMPLANT
NEEDLE 22X1 1/2 (OR ONLY) (NEEDLE) ×3 IMPLANT
NS IRRIG 1000ML POUR BTL (IV SOLUTION) ×3 IMPLANT
PAD ARMBOARD 7.5X6 YLW CONV (MISCELLANEOUS) ×6 IMPLANT
POUCH RETRIEVAL ECOSAC 10 (ENDOMECHANICALS) ×1 IMPLANT
POUCH RETRIEVAL ECOSAC 10MM (ENDOMECHANICALS) ×2
RELOAD 45 VASCULAR/THIN (ENDOMECHANICALS) IMPLANT
RELOAD STAPLE TA45 3.5 REG BLU (ENDOMECHANICALS) IMPLANT
SCISSORS LAP 5X35 DISP (ENDOMECHANICALS) IMPLANT
SET IRRIG TUBING LAPAROSCOPIC (IRRIGATION / IRRIGATOR) ×3 IMPLANT
SHEARS HARMONIC ACE PLUS 36CM (ENDOMECHANICALS) ×3 IMPLANT
SPECIMEN JAR SMALL (MISCELLANEOUS) IMPLANT
SUT VIC AB 4-0 PS2 27 (SUTURE) ×3 IMPLANT
TOWEL OR 17X24 6PK STRL BLUE (TOWEL DISPOSABLE) IMPLANT
TOWEL OR 17X26 10 PK STRL BLUE (TOWEL DISPOSABLE) ×3 IMPLANT
TRAY FOLEY CATH SILVER 16FR (SET/KITS/TRAYS/PACK) IMPLANT
TRAY LAPAROSCOPIC MC (CUSTOM PROCEDURE TRAY) ×3 IMPLANT
TROCAR XCEL 12X100 BLDLESS (ENDOMECHANICALS) ×3 IMPLANT
TROCAR XCEL BLUNT TIP 100MML (ENDOMECHANICALS) ×3 IMPLANT
TROCAR XCEL NON-BLD 5MMX100MML (ENDOMECHANICALS) ×3 IMPLANT
TUBING INSUFFLATION (TUBING) ×3 IMPLANT
WATER STERILE IRR 1000ML POUR (IV SOLUTION) ×3 IMPLANT

## 2017-07-23 NOTE — Anesthesia Preprocedure Evaluation (Addendum)
Anesthesia Evaluation  Patient identified by MRN, date of birth, ID band Patient awake    Reviewed: Allergy & Precautions, NPO status , Patient's Chart, lab work & pertinent test results, reviewed documented beta blocker date and time   Airway Mallampati: II  TM Distance: <3 FB Neck ROM: Full    Dental  (+) Teeth Intact, Dental Advisory Given, Chipped,    Pulmonary asthma , sleep apnea , Current Smoker,    Pulmonary exam normal breath sounds clear to auscultation       Cardiovascular hypertension, Pt. on home beta blockers and Pt. on medications (-) anginaNormal cardiovascular exam+ dysrhythmias Atrial Fibrillation  Rhythm:Regular Rate:Normal     Neuro/Psych Seizures -,  PSYCHIATRIC DISORDERS Anxiety Depression Bipolar Disorder  Neuromuscular disease    GI/Hepatic GERD  ,(+) Hepatitis -, C  Endo/Other  Morbid obesity  Renal/GU      Musculoskeletal  (+) Arthritis , Rheumatoid disorders,    Abdominal   Peds  Hematology negative hematology ROS (+)   Anesthesia Other Findings   Reproductive/Obstetrics                            Anesthesia Physical Anesthesia Plan  ASA: III  Anesthesia Plan: General   Post-op Pain Management:    Induction: Intravenous  PONV Risk Score and Plan: 2 and Treatment may vary due to age or medical condition, Dexamethasone and Ondansetron  Airway Management Planned: Video Laryngoscope Planned and Oral ETT  Additional Equipment:   Intra-op Plan:   Post-operative Plan: Extubation in OR  Informed Consent: I have reviewed the patients History and Physical, chart, labs and discussed the procedure including the risks, benefits and alternatives for the proposed anesthesia with the patient or authorized representative who has indicated his/her understanding and acceptance.   Dental advisory given  Plan Discussed with: CRNA  Anesthesia Plan Comments:         Anesthesia Quick Evaluation

## 2017-07-23 NOTE — ED Notes (Signed)
Pt informed of the need of a urine sample. Urinal left at bedside.

## 2017-07-23 NOTE — ED Provider Notes (Signed)
[facility] EMERGENCY DEPARTMENT Provider Note   CSN: [id number] Arrival date & time: 07/23/17  0758     History   Chief Complaint Chief Complaint  Patient presents with  . Abdominal Pain    right side    HPI Blake Arnold is a 47 y.o. male presenting with new abdominal pain since 8 PM last night.  Patient states that pain is concentrated on the right side but spreads diffusely throughout his entire abdomen.  Patient states that pain is sharp and constant, pain is worse with movement.  Patient rates pain as 10/10.  Patient states that he has never had this pain before.  Patient states that he has vomited one time this morning after trying to take Alka-Seltzer, denies hematemesis.  Patient reports 2 normal bowel movements this morning.  Patient states that while waiting to see provider he began developing chest pain 1 hour ago.  Patient describes his chest pain as sharp, moderate in intensity, and intermittent lasting approximately 1 minute per episode.  Patient states that he has had this pain before over one year ago when he was abusing drugs.   Past Medical History:  Diagnosis Date  . A-fib ([place])   . Acid reflux   . Alcohol abuse   . Anxiety   . AVN (avascular necrosis of bone) ([place])   . Chronic back pain   . Depression   . Hepatitis C    history of  . History of urinary frequency   . Hypertension   . Peripheral neuropathy    hands and feet  . Polysubstance abuse ([place]) 01/25/2011  . Rheumatoid arteritis   . Seizures [facility])     Patient Active Problem List   Diagnosis Date Noted  . Alcohol abuse with alcohol-induced mood disorder ([place]) 07/26/2013  . Bipolar disorder, unspecified ([place]) 10/28/2012  . Alcohol dependence ([place]) 04/02/2012  . Alcohol withdrawal ([place]) 04/02/2012  . Major depression 04/02/2012  . Legal problem 04/22/2011  . Polysubstance abuse ([place]) 01/25/2011  . Bronchiolitis 01/25/2011  . Alcohol abuse 01/22/2011  . Chest pain  01/22/2011  . TOBACCO ABUSE 02/09/2010  . SUBSTANCE ABUSE 02/09/2010  . FIBRILLATION, ATRIAL 06/08/2009    Past Surgical History:  Procedure Laterality Date  . CARDIOVERSION  03/07/2006        Home Medications    Prior to Admission medications   Medication Sig Start Date End Date Taking? Authorizing Provider  Adalimumab (HUMIRA) 40 MG/0.8ML PSKT Inject 0.8 mLs into the skin once a week. tuesdays 06/09/17 06/09/18 Yes [provider]  Albuterol Sulfate (PROAIR RESPICLICK) 623 (90 BASE) MCG/ACT AEPB Inhale 2 puffs into the lungs every 4 (four) hours as needed (wheezing; cough). 11/04/14  Yes Melony Overly, MD  amLODipine (NORVASC) 5 MG tablet Take 5 mg by mouth at bedtime. 07/14/17  Yes [provider]  benztropine (COGENTIN) 0.5 MG tablet Take 0.5 mg by mouth 2 (two) times daily.   Yes [provider]  gabapentin (NEURONTIN) 400 MG capsule Take 400 mg by mouth 3 (three) times daily.   Yes [provider]  hydrochlorothiazide (HYDRODIURIL) 25 MG tablet Take 25 mg by mouth daily.   Yes [provider]  Methotrexate, Anti-Rheumatic, (METHOTREXATE, PF, [place]) Inject 1 mL into the skin once a week. fridays   Yes [provider]  metoprolol tartrate (LOPRESSOR) 50 MG tablet Take 50 mg by mouth 2 (two) times daily. 07/14/17  Yes [provider]  ranitidine (ZANTAC) 150 MG tablet Take  150 mg by mouth 2 (two) times daily.   Yes [provider]  risperiDONE (RISPERDAL) 2 MG tablet Take 1 tablet (2 mg total) by mouth at bedtime. For mood control 07/27/13  Yes Withrow, Elyse Jarvis, FNP  sertraline (ZOLOFT) 100 MG tablet Take 100 mg by mouth at bedtime. 07/14/17  Yes [provider]  acamprosate (CAMPRAL) 333 MG tablet Take 2 tablets (666 mg total) by mouth 3 (three) times daily with meals. For alcohol dependence Patient not taking: Reported on 07/23/2017 07/27/13   Withrow, Elyse Jarvis, FNP  diclofenac (VOLTAREN) 50 MG EC tablet Take 1 tablet  (50 mg total) by mouth 3 (three) times daily as needed (joint pain/ inflammation). Patient not taking: Reported on 07/23/2017 07/27/13   Benjamine Mola, FNP  gabapentin (NEURONTIN) 300 MG capsule Take 1 capsule (300 mg total) by mouth 2 (two) times daily. For substance withdrawal syndrome Patient not taking: Reported on 07/23/2017 02/22/13   Lindell Spar I, NP  lisinopril (PRINIVIL,ZESTRIL) 10 MG tablet Take 1 tablet (10 mg total) by mouth daily. Patient not taking: Reported on 07/23/2017 07/27/13   Withrow, Elyse Jarvis, FNP  ondansetron (ZOFRAN) 4 MG tablet Take 1 tablet (4 mg total) by mouth every 6 (six) hours. Patient not taking: Reported on 07/23/2017 08/11/13   Harvie Heck, PA-C  traMADol (ULTRAM) 50 MG tablet Take 2 tablets (100 mg total) by mouth every 6 (six) hours as needed. Patient not taking: Reported on 07/23/2017 12/09/14   Waldemar Dickens, MD    Family History History reviewed. No pertinent family history.  Social History Social History   Tobacco Use  . Smoking status: Current Every Day Smoker    Packs/day: 1.00    Years: 25.00    Pack years: 25.00    Types: Cigarettes  . Smokeless tobacco: Former Systems developer    Quit date: 10/24/2012  Substance Use Topics  . Alcohol use: No    Alcohol/week: 0.0 oz    Comment: 1/5 Vodka; 1-40oz Daily   . Drug use: No    Types: "Crack" cocaine, Cocaine    Comment: last use 07/26/13     Allergies   Lisinopril   Review of Systems Review of Systems  Constitutional: Negative.  Negative for fever and unexpected weight change.  HENT: Negative.  Negative for rhinorrhea and sore throat.   Eyes: Negative.   Respiratory: Negative.  Negative for shortness of breath, wheezing and stridor.   Cardiovascular: Positive for chest pain.  Gastrointestinal: Positive for abdominal pain, nausea and vomiting. Negative for abdominal distention, blood in stool, constipation and diarrhea.  Endocrine: Negative.   Genitourinary: Negative.  Negative for dysuria, flank  pain, hematuria, scrotal swelling and testicular pain.  Musculoskeletal: Negative.  Negative for arthralgias, myalgias and neck pain.  Skin: Negative.  Negative for rash.  Allergic/Immunologic: Negative.   Neurological: Negative.  Negative for dizziness, weakness, light-headedness and headaches.  Hematological: Negative.   Psychiatric/Behavioral: Negative.      Physical Exam Updated Vital Signs BP 127/77   Pulse 67   Temp 97.7 F (36.5 C) (Oral)   Resp 14   Ht 5' 7.99" (1.727 m)   Wt 115.2 kg (254 lb)   SpO2 92%   BMI 38.63 kg/m   Physical Exam  Constitutional: He is oriented to person, place, and time. He appears well-developed and well-nourished.  Non-toxic appearance. He appears distressed.  HENT:  Head: Normocephalic and atraumatic.  Mouth/Throat: Oropharynx is clear and moist.  Cardiovascular: Normal rate, regular rhythm, normal  heart sounds and intact distal pulses. Exam reveals no gallop and no friction rub.  No murmur heard. Pulmonary/Chest: Effort normal and breath sounds normal. No stridor. No respiratory distress. He has no wheezes. He has no rhonchi. He has no rales.  Abdominal: Soft. Normal appearance. He exhibits no distension, no pulsatile midline mass and no mass. Bowel sounds are increased. There is generalized tenderness and tenderness in the right upper quadrant, right lower quadrant, periumbilical area and suprapubic area. There is rebound, guarding and tenderness at McBurney's point. There is no CVA tenderness and negative Murphy's sign.  obese  Genitourinary:  Genitourinary Comments: Deferred   Neurological: He is alert and oriented to person, place, and time.  Skin: Skin is warm and dry. Capillary refill takes less than 2 seconds.  Psychiatric: He has a normal mood and affect. His behavior is normal.     ED Treatments / Results  Labs (all labs ordered are listed, but only abnormal results are displayed) Labs Reviewed  COMPREHENSIVE METABOLIC PANEL -  Abnormal; Notable for the following components:      Result Value   Chloride 100 (*)    Glucose, Bld 105 (*)    ALT 68 (*)    All other components within normal limits  CBC - Abnormal; Notable for the following components:   WBC 13.7 (*)    Hemoglobin 17.3 (*)    All other components within normal limits  URINALYSIS, ROUTINE W REFLEX MICROSCOPIC - Abnormal; Notable for the following components:   Color, Urine AMBER (*)    APPearance HAZY (*)    Bilirubin Urine SMALL (*)    Protein, ur 100 (*)    All other components within normal limits  LIPASE, BLOOD  RAPID URINE DRUG SCREEN, HOSP PERFORMED  I-STAT TROPONIN, ED    EKG None  Radiology Ct Abdomen Pelvis W Contrast  Result Date: 07/23/2017 CLINICAL DATA:  Diffuse abdominal pain. History of polysubstance abuse, hepatitis C. EXAM: CT ABDOMEN AND PELVIS WITH CONTRAST TECHNIQUE: Multidetector CT imaging of the abdomen and pelvis was performed using the standard protocol following bolus administration of intravenous contrast. CONTRAST:  117mL OMNIPAQUE IOHEXOL 300 MG/ML  SOLN COMPARISON:  CT abdomen and pelvis February 25, 2012 FINDINGS: LOWER CHEST: Lung bases are clear. Included heart size is normal. No pericardial effusion. HEPATOBILIARY: Liver and gallbladder are normal. PANCREAS: Normal. SPLEEN: Normal. ADRENALS/URINARY TRACT: Kidneys are orthotopic, demonstrating symmetric enhancement. No nephrolithiasis, hydronephrosis or solid renal masses. The unopacified ureters are normal in course and caliber. Delayed imaging through the kidneys demonstrates symmetric prompt contrast excretion within the proximal urinary collecting system. Urinary bladder is decompressed and unremarkable. Normal adrenal glands. STOMACH/BOWEL: The stomach, small and large bowel are normal in course and caliber without inflammatory changes. Mild colonic diverticulosis. Appendix: Location: RIGHT mid abdomen extending midline. Diameter: 13. Appendicolith: 2 appendicoliths  measuring 8 mm. Mucosal hyper-enhancement: Present, with periappendiceal flat stranding. Extraluminal gas: None present. Periappendiceal collection: Not present. VASCULAR/LYMPHATIC: Aortoiliac vessels are normal in course and caliber. Mild calcific atherosclerosis. No lymphadenopathy by CT size criteria. REPRODUCTIVE: Normal. OTHER: No intraperitoneal free fluid or free air. MUSCULOSKELETAL: Nonacute. Streak artifact from RIGHT hip total arthroplasty. Small bilateral fat containing inguinal hernias. IMPRESSION: 1. Acute appendicitis without immediate complication. 2. Acute findings discussed with and reconfirmed by PA.Naren Benally on 07/23/2017 at 2:07 pm. Aortic Atherosclerosis (ICD10-I70.0). Electronically Signed   By: Elon Alas M.D.   On: 07/23/2017 14:07   Dg Chest Port 1 View  Result Date: 07/23/2017 CLINICAL DATA:  Chest pain EXAM: PORTABLE CHEST 1 VIEW COMPARISON:  March 11, 2016 FINDINGS: There is fibrotic change in the bases. There is no edema or consolidation. Heart is mildly enlarged with pulmonary vascularity normal. No adenopathy. There is postoperative change in the lower cervical region. IMPRESSION: Bibasilar fibrosis. No edema or consolidation. Stable cardiomegaly. Electronically Signed   By: Lowella Grip III M.D.   On: 07/23/2017 12:58    Procedures Procedures (including critical care time)  Medications Ordered in ED Medications  cefTRIAXone (ROCEPHIN) 2 g in sodium chloride 0.9 % 100 mL IVPB (2 g Intravenous New Bag/Given 07/23/17 1440)    And  metroNIDAZOLE (FLAGYL) IVPB 500 mg (500 mg Intravenous New Bag/Given 07/23/17 1529)  0.9 % irrigation (POUR BTL) (1,000 mLs Irrigation Given 07/23/17 1534)  bupivacaine-EPINEPHrine (MARCAINE W/ EPI) 0.25% -1:200000 (with pres) injection (30 mLs Infiltration Given 07/23/17 1534)  sodium chloride irrigation 0.9 % (1,000 mLs  Given 07/23/17 1536)  lactated ringers infusion ( Intravenous New Bag/Given 07/23/17 1550)  HYDROmorphone  (DILAUDID) injection 0.5 mg (0.5 mg Intravenous Given 07/23/17 1334)  ondansetron (ZOFRAN) injection 4 mg (4 mg Intravenous Given 07/23/17 1334)  iohexol (OMNIPAQUE) 300 MG/ML solution 100 mL (100 mLs Intravenous Contrast Given 07/23/17 1344)  HYDROmorphone (DILAUDID) injection 0.5 mg (0.5 mg Intravenous Given 07/23/17 1439)     Initial Impression / Assessment and Plan / ED Course  I have reviewed the triage vital signs and the nursing notes.  Pertinent labs & imaging results that were available during my care of the patient were reviewed by me and considered in my medical decision making (see chart for details).   Acute appendicitis, confirmed via CT scan.  Consult called to surgery.  IV antibiotic started with ceftriaxone and metronidazole, pain controlled with Dilaudid, nausea controlled with Zofran.  Chest pain likely referred from abdominal pain, EKG reviewed by Dr. Colin Mulders.  Troponin ordered and to be drawn.  15:10 - Surgery requested patient be admitted to hospitalist team.  Troponin was not drawn prior to patient being transported for surgery. Consulted surgery team who will follow up with troponin.   Final Clinical Impressions(s) / ED Diagnoses   Final diagnoses:  Acute appendicitis, unspecified acute appendicitis type  Neutrophilic leukocytosis    ED Discharge Orders    None       Gari Crown 07/23/17 1557    Sherwood Gambler, MD 07/25/17 1204

## 2017-07-23 NOTE — Op Note (Signed)
07/23/2017  10:01 PM  PATIENT:  Blake Arnold  47 y.o. male  PRE-OPERATIVE DIAGNOSIS:  ACUTE APPENDICITIS  POST-OPERATIVE DIAGNOSIS:  ACUTE APPENDICITIS  PROCEDURE:  Procedure(s): APPENDECTOMY LAPAROSCOPIC  SURGEON:  Surgeon(s): Georganna Skeans, MD  ASSISTANTS: none  ANESTHESIA:   local and general  EBL:  Total I/O In: 120 [P.O.:120] Out: 600 [Urine:600]  BLOOD ADMINISTERED:none  DRAINS: none   SPECIMEN:  Excision  DISPOSITION OF SPECIMEN:  PATHOLOGY  COUNTS:  YES  DICTATION: Blake Arnold presents for appendectomy.  He received intravenous antibiotics.  Informed consent was obtained.  He was brought to the operating room and general endotracheal anesthesia was administered by the anesthesia staff.  His abdomen was prepped and draped in a sterile fashion.The infraumbilical region was infiltrated with local. Infraumbilical incision was made. Subcutaneous tissues were dissected down revealing the anterior fascia. This was divided sharply along the midline. Peritoneal cavity was entered under direct vision without complication. A 0 Vicryl pursestring was placed around the fascial opening. Hassan trocar was inserted into the abdomen. The abdomen was insufflated with carbon dioxide in standard fashion. Under direct vision a 12 mm left lower quadrant and a 5 mm right mid abdomen port were placed.  Local was used at each port site.  Laparoscopic expiration revealed his appendix extending down inferiorly from the cecum and adherent to the retroperitoneum.  The base was dissected free and divided with Endo GIA with a vascular load.  The mesoappendix was then gradually taken down using the harmonic scalpel and the appendix was carefully dissected off the retroperitoneum.  It was placed in a bag and removed from the abdomen.  It was sent to pathology.  Next, the area was copiously irrigated with saline.  The staple line was intact on the cecum.  There was some mild ooze from the site where the  appendix was stuck down the retroperitoneum.  I placed a piece of Surgicel snow.  There was good hemostasis.  Ports were removed under direct vision.  Pneumoperitoneum was released.  The infraumbilical fascia was closed by tying the Vicryl suture.  All 3 wounds were irrigated and closed with 4-0 Vicryl followed by Dermabond.  All counts were correct.  He tolerated the procedure well without apparent complication and was taken recovery in stable condition.  PATIENT DISPOSITION:  PACU - hemodynamically stable.   Delay start of Pharmacological VTE agent (>24hrs) due to surgical blood loss or risk of bleeding:  no  Georganna Skeans, MD, MPH, FACS Pager: [phone]  6/12/201910:01 PM

## 2017-07-23 NOTE — Consult Note (Signed)
Medical Consultation   Blake Arnold  GQQ:[id number]  DOB: Jun 09, 1970  DOA: 07/23/2017  PCP: Elbert Ewings, FNP   Outpatient Specialists:   Requesting physician: Dr. Grandville Silos  Reason for consultation: Managing chronic medical issues  History of Present Illness:  Blake Arnold is an 47 y.o. male with a past medical history of hypertension, GERD, depression, bipolar disorder, anxiety, polysubstance abuse (cocaine, alcohol, tobacco), seizure, RA on Humira and methotrexate, atrial fibrillation not on anticoagulants, HCV (completed antiviral treatment 2017), who presents with right lower quadrant abdominal pain. Pt was found to have acute appendicitis by CT scan.  Patient is s/p of appendectomy.  We are asked to consult on this patient since patient has several chronic medical issues.  Patient states that he started having right lower quadrant abdominal pain at about 8 PM last night.  It is constant, severe, 10 out of 10 severity, nonradiating.  It is associated with nausea and mild vomiting.  No diarrhea.  Patient has chills, but no fever.  Patient also reports 2 episodes of chest pain, which is located in the substernal area, sharp, moderate, nonradiating, lasted for about 2-3 second, then resolved completely.  Currently patient does not have chest pain or shortness breath.  He has mild cough which he attributes to smoking.  No tenderness in the calf areas.  Patient does not have symptoms of UTI or unilateral weakness.  Patient has history of seizure.  He is supposed to take Tegretol, but he stopped taking Tegretol 3 to 4 months ago.  He states that his last seizure was 1 year ago.  Review of Systems:   General: no fevers, has chills, no changes in body weight, no changes in appetite Skin: no rash HEENT: no blurry vision, hearing changes or sore throat Pulm: no dyspnea, has coughing, no wheezing CV: had chest pain, no palpitations, shortness of breath Abd: has  nausea/vomiting, abdominal pain, no diarrhea/constipation GU: no dysuria, hematuria, polyuria Ext: no arthralgias, myalgias Neuro: no weakness, numbness, or tingling  Past Medical History: Past Medical History:  Diagnosis Date  . A-fib ([place])   . Acid reflux   . Alcohol abuse   . Anxiety   . AVN (avascular necrosis of bone) ([place])   . Chronic back pain   . Depression   . Hepatitis C    history of  . History of urinary frequency   . Hypertension   . Peripheral neuropathy    hands and feet  . Polysubstance abuse ([place]) 01/25/2011  . Rheumatoid arteritis   . Seizures ([place])     Past Surgical History: Past Surgical History:  Procedure Laterality Date  . CARDIOVERSION  03/07/2006     Allergies:   Allergies  Allergen Reactions  . Lisinopril Swelling     Social History:  reports that he has been smoking cigarettes.  He has a 25.00 pack-year smoking history. He quit smokeless tobacco use about 4 years ago. He reports that he drinks alcohol. He reports that he has current or past drug history. Drugs: "Crack" cocaine and Cocaine.   Family History: Family History  Problem Relation Age of Onset  . Heart attack Mother   . Atrial fibrillation Mother   . Skin cancer Father    Physical Exam: Vitals:   07/23/17 1810 07/23/17 1825 07/23/17 1846 07/23/17 2044  BP: 120/82 124/67 (!) 120/92 136/70  Pulse: 92 71 83 87  Resp: 20 20  17  Temp:  97.7 F (36.5 C) 98 F (36.7 C) (!) 97.5 F (36.4 C)  TempSrc:   Oral Oral  SpO2: 95% 95% 99% 94%  Weight:      Height:        General: Not in acute distress HEENT: PERRL, EOMI, no scleral icterus, No JVD or bruit Cardiac: S1/S2, RRR, No murmurs, gallops or rubs Pulm: Clear to auscultation bilaterally. No rales, wheezing, rhonchi or rubs. Abd: Soft, nondistended, has tenderness in RLQ and surgical sites. rno rebound pain, no organomegaly, BS present Ext: No edema. 2+DP/PT pulse bilaterally Musculoskeletal: No joint deformities,  erythema, or stiffness, ROM full Skin: No rashes.  Neuro: Alert and oriented X3, cranial nerves II-XII grossly intact.  Moves all extremities normally Psych: Patient is not psychotic, no suicidal or hemocidal ideation.  Data reviewed:  I have personally reviewed following labs and imaging studies Labs:  CBC: Recent Labs  Lab 07/23/17 0819 07/23/17 1928  WBC 13.7* 13.6*  HGB 17.3* 17.5*  HCT 49.9 50.4  MCV 96.5 97.9  PLT 251 732    Basic Metabolic Panel: Recent Labs  Lab 07/23/17 0819 07/23/17 1928  NA 135  --   K 3.7  --   CL 100*  --   CO2 24  --   GLUCOSE 105*  --   BUN 7  --   CREATININE 0.96 1.07  CALCIUM 9.6  --    GFR Estimated Creatinine Clearance: 104 mL/min (by C-G formula based on SCr of 1.07 mg/dL). Liver Function Tests: Recent Labs  Lab 07/23/17 0819  AST 35  ALT 68*  ALKPHOS 57  BILITOT 0.7  PROT 7.4  ALBUMIN 4.2   Recent Labs  Lab 07/23/17 0819  LIPASE 28   No results for input(s): AMMONIA in the last 168 hours. Coagulation profile No results for input(s): INR, PROTIME in the last 168 hours.  Cardiac Enzymes: Recent Labs  Lab 07/23/17 2034  TROPONINI <0.03   BNP: Invalid input(s): POCBNP CBG: No results for input(s): GLUCAP in the last 168 hours. D-Dimer No results for input(s): DDIMER in the last 72 hours. Hgb A1c No results for input(s): HGBA1C in the last 72 hours. Lipid Profile No results for input(s): CHOL, HDL, LDLCALC, TRIG, CHOLHDL, LDLDIRECT in the last 72 hours. Thyroid function studies No results for input(s): TSH, T4TOTAL, T3FREE, THYROIDAB in the last 72 hours.  Invalid input(s): FREET3 Anemia work up No results for input(s): VITAMINB12, FOLATE, FERRITIN, TIBC, IRON, RETICCTPCT in the last 72 hours. Urinalysis    Component Value Date/Time   COLORURINE AMBER (A) 07/23/2017 0816   APPEARANCEUR HAZY (A) 07/23/2017 0816   LABSPEC 1.029 07/23/2017 0816   PHURINE 7.0 07/23/2017 0816   GLUCOSEU NEGATIVE 07/23/2017  0816   HGBUR NEGATIVE 07/23/2017 0816   BILIRUBINUR SMALL (A) 07/23/2017 0816   KETONESUR NEGATIVE 07/23/2017 0816   PROTEINUR 100 (A) 07/23/2017 0816   UROBILINOGEN 0.2 11/27/2012 0946   NITRITE NEGATIVE 07/23/2017 0816   LEUKOCYTESUR NEGATIVE 07/23/2017 0816     Microbiology No results found for this or any previous visit (from the past 240 hour(s)).     Inpatient Medications:   Scheduled Meds: . amLODipine  5 mg Oral QHS  . aspirin EC  81 mg Oral Daily  . benztropine  0.5 mg Oral BID  . carbamazepine  200 mg Oral BID  . enoxaparin (LOVENOX) injection  40 mg Subcutaneous Q24H  . famotidine  20 mg Oral Daily  . folic acid  1  mg Oral Daily  . gabapentin  400 mg Oral TID  . hydrochlorothiazide  25 mg Oral Daily  . LORazepam  0-4 mg Intravenous Q6H   Followed by  . [START ON 07/25/2017] LORazepam  0-4 mg Intravenous Q12H  . metoprolol tartrate  50 mg Oral BID  . multivitamin with minerals  1 tablet Oral Daily  . nicotine  21 mg Transdermal QHS  . risperiDONE  2 mg Oral QHS  . sertraline  100 mg Oral QHS  . thiamine  100 mg Oral Daily   Or  . thiamine  100 mg Intravenous Daily   Continuous Infusions: . sodium chloride 100 mL/hr at 07/23/17 1900  . cefTRIAXone (ROCEPHIN)  IV     And  . metronidazole 500 mg (07/23/17 2239)  . lactated ringers 10 mL/hr at 07/23/17 1550     Radiological Exams on Admission: Ct Abdomen Pelvis W Contrast  Result Date: 07/23/2017 CLINICAL DATA:  Diffuse abdominal pain. History of polysubstance abuse, hepatitis C. EXAM: CT ABDOMEN AND PELVIS WITH CONTRAST TECHNIQUE: Multidetector CT imaging of the abdomen and pelvis was performed using the standard protocol following bolus administration of intravenous contrast. CONTRAST:  130mL OMNIPAQUE IOHEXOL 300 MG/ML  SOLN COMPARISON:  CT abdomen and pelvis February 25, 2012 FINDINGS: LOWER CHEST: Lung bases are clear. Included heart size is normal. No pericardial effusion. HEPATOBILIARY: Liver and  gallbladder are normal. PANCREAS: Normal. SPLEEN: Normal. ADRENALS/URINARY TRACT: Kidneys are orthotopic, demonstrating symmetric enhancement. No nephrolithiasis, hydronephrosis or solid renal masses. The unopacified ureters are normal in course and caliber. Delayed imaging through the kidneys demonstrates symmetric prompt contrast excretion within the proximal urinary collecting system. Urinary bladder is decompressed and unremarkable. Normal adrenal glands. STOMACH/BOWEL: The stomach, small and large bowel are normal in course and caliber without inflammatory changes. Mild colonic diverticulosis. Appendix: Location: RIGHT mid abdomen extending midline. Diameter: 13. Appendicolith: 2 appendicoliths measuring 8 mm. Mucosal hyper-enhancement: Present, with periappendiceal flat stranding. Extraluminal gas: None present. Periappendiceal collection: Not present. VASCULAR/LYMPHATIC: Aortoiliac vessels are normal in course and caliber. Mild calcific atherosclerosis. No lymphadenopathy by CT size criteria. REPRODUCTIVE: Normal. OTHER: No intraperitoneal free fluid or free air. MUSCULOSKELETAL: Nonacute. Streak artifact from RIGHT hip total arthroplasty. Small bilateral fat containing inguinal hernias. IMPRESSION: 1. Acute appendicitis without immediate complication. 2. Acute findings discussed with and reconfirmed by PA.BRANDON MORELLI on 07/23/2017 at 2:07 pm. Aortic Atherosclerosis (ICD10-I70.0). Electronically Signed   By: Elon Alas M.D.   On: 07/23/2017 14:07   Dg Chest Port 1 View  Result Date: 07/23/2017 CLINICAL DATA:  Chest pain EXAM: PORTABLE CHEST 1 VIEW COMPARISON:  March 11, 2016 FINDINGS: There is fibrotic change in the bases. There is no edema or consolidation. Heart is mildly enlarged with pulmonary vascularity normal. No adenopathy. There is postoperative change in the lower cervical region. IMPRESSION: Bibasilar fibrosis. No edema or consolidation. Stable cardiomegaly. Electronically Signed    By: Lowella Grip III M.D.   On: 07/23/2017 12:58    Impression/Recommendations Principal Problem:   Appendicitis Active Problems:   FIBRILLATION, ATRIAL   TOBACCO ABUSE   Alcohol abuse   Chest pain   Polysubstance abuse ([facility])   Major depression   Bipolar disorder, unspecified ([facility])   HTN (hypertension)   Seizure ([place])   RA (rheumatoid arthritis) ([facility])    Appendicitis: s/p of appendectomy.  Has leukocytosis, no fever.  Clinically not septic.   -pt is on Flagyl and Rocephin -pain control -will get Bx  Atrial Fibrillation: CHA2DS2-VASc Score is 1,  not need oral anticoagulation. He is in sinus rhythm now.  Heart rate is well controlled. -ASA -tele monitoring  Polysubstance abuse and tobacco abuse and alcohol abuse: UDS negative today. Alcohol 164. -Did counseling about importance of quitting smoking -Nicotine patch -Did counseling about the importance of quitting drinking -CIWA protocol  Chest pain: Patient had 2 episodes of very atypical chest pain, lasted only for 2 to 3 seconds.  Currently no chest pain or shortness of breath. -trop x 3 -ASA -f/u A1c and FLP -prn NTG and morphine  Major depression and Bipolar disorder, unspecified ([place]): -cogentin, Zoloft, risperidone  Seizure -Seizure precaution -When necessary Ativan for seizure -resume home Tegretol  HTN:  -Continue home medications: Amlodipine, HCTZ, metoprolol -IV hydralazine prn  GERD: -Pepcid  RA: Patient is on Humira injection, last dose was on 6/7.  He is also on methotrexate. stable -hold Humira  -continue methotrexate   Thank you for this consultation.  Our [facility] hospitalist team will follow the patient with you.   Time Spent: 48 min  Ivor Costa M.D. [facility] 07/24/2017, 1:17 AM

## 2017-07-23 NOTE — Transfer of Care (Signed)
Immediate Anesthesia Transfer of Care Note  Patient: [name]  Procedure(s) Performed: APPENDECTOMY LAPAROSCOPIC (N/A )  Patient Location: PACU  Anesthesia Type:General  Level of Consciousness: awake, alert  and oriented  Airway & Oxygen Therapy: Patient Spontanous Breathing and Patient connected to nasal cannula oxygen  Post-op Assessment: Report given to RN, Post -op Vital signs reviewed and stable and Patient moving all extremities X 4  Post vital signs: Reviewed and stable  Last Vitals:  Vitals Value Taken Time  BP    Temp    Pulse 83 07/23/2017  5:40 PM  Resp 28 07/23/2017  5:40 PM  SpO2 98 % 07/23/2017  5:40 PM  Vitals shown include unvalidated device data.  Last Pain:  Vitals:   07/23/17 1443  TempSrc:   PainSc: 10-Worst pain ever         Complications: No apparent anesthesia complications

## 2017-07-23 NOTE — Consult Note (Deleted)
[facility] Admission Note  Blake Arnold [birth year]  [id number].    Requesting MD: Margarita Mail, PA-C Chief Complaint/Reason for Consult: appendicitis   HPI:  Pt is a 47 year old male with a PMHX of Hep C, A fib not anticoagulated, polysubstance abuse, seizures, chronic back pain, bipolar & Depression, HTN, RA on Humera and methotrexate who presented to the ED with complaints of abdominal pain since last night around 8pm. Pt states he had rapid onset, severe, non radiating, RLQ abdominal pain with associated diaphoresis, chills, nausea and self inflicted vomiting. Having normal bowel movements. Pt states he tried Copywriter, advertising and a hot bath without relief. Pt states 2 episodes of CP after onset of pain but he states this is normal for him. No other associated symptoms. Last meal was yesterday.   CT showed acute appendicitis. WBC 13.7  ROS:  Review of Systems  Constitutional: Positive for chills. Negative for diaphoresis and fever.  HENT: Negative for sore throat.   Respiratory: Negative for cough and shortness of breath.   Cardiovascular: Negative for chest pain.  Gastrointestinal: Positive for abdominal pain and nausea. Negative for blood in stool, constipation, diarrhea and vomiting.  Genitourinary: Negative for dysuria, frequency, hematuria and urgency.  Musculoskeletal: Positive for back pain (chronic).  Skin: Negative for rash.  Neurological: Negative for dizziness, focal weakness and loss of consciousness.  All other systems reviewed and are negative.    No family history on file.  Past Medical History:  Diagnosis Date  . A-fib ([place])   . Acid reflux   . Alcohol abuse   . Anxiety   . AVN (avascular necrosis of bone) ([place])   . Chronic back pain   . Chronic back pain   . Depression   . Hepatitis C   . Hepatitis C    history of  . History of urinary frequency   . Hypertension   . Peripheral neuropathy    hands and feet  . Polysubstance abuse ([place])  01/25/2011  . Rheumatoid arteritis   . Seizures ([place])     Past Surgical History:  Procedure Laterality Date  . CARDIOVERSION  03/07/2006    Social History:  reports that he has been smoking cigarettes.  He has a 25.00 pack-year smoking history. He quit smokeless tobacco use about 4 years ago. He reports that he does not drink alcohol or use drugs.  Allergies:  Allergies  Allergen Reactions  . Lisinopril Swelling     (Not in a hospital admission)  Blood pressure (!) 160/91, pulse (!) 58, temperature 97.7 F (36.5 C), temperature source Oral, resp. rate 20, height _0  (1.727 m), weight 115.2 kg (254 lb), SpO2 97 %.  Physical Exam  Constitutional: He is oriented to person, place, and time. He appears well-developed and well-nourished.  Non-toxic appearance. He does not appear ill. No distress.  HENT:  Head: Normocephalic and atraumatic.  Nose: Nose normal.  Mouth/Throat: Uvula is midline, oropharynx is clear and moist and mucous membranes are normal. No oropharyngeal exudate.  Eyes: Pupils are equal, round, and reactive to light. Conjunctivae and lids are normal. Right eye exhibits no discharge. Left eye exhibits no discharge. No scleral icterus.  Neck: Normal range of motion. Neck supple. No thyromegaly present.  Cardiovascular: Normal rate, regular rhythm, normal heart sounds and intact distal pulses.  No murmur heard. Pulses:      Radial pulses are 2+ on the right side, and 2+ on the left side.  Dorsalis pedis pulses are 2+ on the right side, and 2+ on the left side.  Pulmonary/Chest: Effort normal and breath sounds normal. No respiratory distress. He has no wheezes. He has no rhonchi. He has no rales.  Abdominal: Soft. Normal appearance and bowel sounds are normal. He exhibits no distension. There is no hepatosplenomegaly. There is generalized tenderness and tenderness in the right lower quadrant. There is tenderness at McBurney's point. There is no rigidity and no  guarding.  Musculoskeletal: Normal range of motion. He exhibits no edema, tenderness or deformity.  Lymphadenopathy:    He has no cervical adenopathy.  Neurological: He is alert and oriented to person, place, and time.  Skin: Skin is warm and dry. No rash noted. He is not diaphoretic.  Psychiatric: He has a normal mood and affect.  Nursing note and vitals reviewed.   Results for orders placed or performed during the hospital encounter of 07/23/17 (from the past 48 hour(s))  Urinalysis, Routine w reflex microscopic     Status: Abnormal   Collection Time: 07/23/17  8:16 AM  Result Value Ref Range   Color, Urine AMBER (A) YELLOW    Comment: BIOCHEMICALS MAY BE AFFECTED BY COLOR   APPearance HAZY (A) CLEAR   Specific Gravity, Urine 1.029 1.005 - 1.030   pH 7.0 5.0 - 8.0   Glucose, UA NEGATIVE NEGATIVE mg/dL   Hgb urine dipstick NEGATIVE NEGATIVE   Bilirubin Urine SMALL (A) NEGATIVE   Ketones, ur NEGATIVE NEGATIVE mg/dL   Protein, ur 100 (A) NEGATIVE mg/dL   Nitrite NEGATIVE NEGATIVE   Leukocytes, UA NEGATIVE NEGATIVE   RBC / HPF 0-5 0 - 5 RBC/hpf   WBC, UA 0-5 0 - 5 WBC/hpf   Bacteria, UA NONE SEEN NONE SEEN   Squamous Epithelial / LPF 0-5 0 - 5   Mucus PRESENT     Comment: Performed at [facility], [address] [place], [place] [address]  Lipase, blood     Status: None   Collection Time: 07/23/17  8:19 AM  Result Value Ref Range   Lipase 28 11 - 51 U/L    Comment: Performed at [facility], [address] [place], [place] [address]  Comprehensive metabolic panel     Status: Abnormal   Collection Time: 07/23/17  8:19 AM  Result Value Ref Range   Sodium 135 135 - 145 mmol/L   Potassium 3.7 3.5 - 5.1 mmol/L   Chloride 100 (L) 101 - 111 mmol/L   CO2 24 22 - 32 mmol/L   Glucose, Bld 105 (H) 65 - 99 mg/dL   BUN 7 6 - 20 mg/dL   Creatinine, Ser 0.96 0.61 - 1.24 mg/dL   Calcium 9.6 8.9 - 10.3 mg/dL   Total Protein 7.4 6.5 - 8.1 g/dL   Albumin 4.2 3.5 - 5.0 g/dL    AST 35 15 - 41 U/L   ALT 68 (H) 17 - 63 U/L   Alkaline Phosphatase 57 38 - 126 U/L   Total Bilirubin 0.7 0.3 - 1.2 mg/dL   GFR calc non Af Amer >60 >60 mL/min   GFR calc Af Amer >60 >60 mL/min    Comment: (NOTE) The eGFR has been calculated using the CKD EPI equation. This calculation has not been validated in all clinical situations. eGFR's persistently <60 mL/min signify possible Chronic Kidney Disease.    Anion gap 11 5 - 15    Comment: Performed at [facility], [address], [place], [place] [address]  CBC     Status: Abnormal   Collection Time: 07/23/17  8:19 AM  Result Value Ref Range   WBC 13.7 (H) 4.0 - 10.5 K/uL   RBC 5.17 4.22 - 5.81 MIL/uL   Hemoglobin 17.3 (H) 13.0 - 17.0 g/dL   HCT 49.9 39.0 - 52.0 %   MCV 96.5 78.0 - 100.0 fL   MCH 33.5 26.0 - 34.0 pg   MCHC 34.7 30.0 - 36.0 g/dL   RDW 13.6 11.5 - 15.5 %   Platelets 251 150 - 400 K/uL    Comment: Performed at [facility], [place] [address] [place], [place] [address]  Rapid urine drug screen (hospital performed)     Status: None   Collection Time: 07/23/17  1:54 PM  Result Value Ref Range   Opiates NONE DETECTED NONE DETECTED   Cocaine NONE DETECTED NONE DETECTED   Benzodiazepines NONE DETECTED NONE DETECTED   Amphetamines NONE DETECTED NONE DETECTED   Tetrahydrocannabinol NONE DETECTED NONE DETECTED   Barbiturates NONE DETECTED NONE DETECTED    Comment: (NOTE) DRUG SCREEN FOR MEDICAL PURPOSES ONLY.  IF CONFIRMATION IS NEEDED FOR ANY PURPOSE, NOTIFY LAB WITHIN 5 DAYS. LOWEST DETECTABLE LIMITS FOR URINE DRUG SCREEN Drug Class                     Cutoff (ng/mL) Amphetamine and metabolites    1000 Barbiturate and metabolites    200 Benzodiazepine                 153 Tricyclics and metabolites     300 Opiates and metabolites        300 Cocaine and metabolites        300 THC                            50 Performed at [facility], [place] [address], [place], [place] [address]    Ct  Abdomen Pelvis W Contrast  Result Date: 07/23/2017 CLINICAL DATA:  Diffuse abdominal pain. History of polysubstance abuse, hepatitis C. EXAM: CT ABDOMEN AND PELVIS WITH CONTRAST TECHNIQUE: Multidetector CT imaging of the abdomen and pelvis was performed using the standard protocol following bolus administration of intravenous contrast. CONTRAST:  183m OMNIPAQUE IOHEXOL 300 MG/ML  SOLN COMPARISON:  CT abdomen and pelvis February 25, 2012 FINDINGS: LOWER CHEST: Lung bases are clear. Included heart size is normal. No pericardial effusion. HEPATOBILIARY: Liver and gallbladder are normal. PANCREAS: Normal. SPLEEN: Normal. ADRENALS/URINARY TRACT: Kidneys are orthotopic, demonstrating symmetric enhancement. No nephrolithiasis, hydronephrosis or solid renal masses. The unopacified ureters are normal in course and caliber. Delayed imaging through the kidneys demonstrates symmetric prompt contrast excretion within the proximal urinary collecting system. Urinary bladder is decompressed and unremarkable. Normal adrenal glands. STOMACH/BOWEL: The stomach, small and large bowel are normal in course and caliber without inflammatory changes. Mild colonic diverticulosis. Appendix: Location: RIGHT mid abdomen extending midline. Diameter: 13. Appendicolith: 2 appendicoliths measuring 8 mm. Mucosal hyper-enhancement: Present, with periappendiceal flat stranding. Extraluminal gas: None present. Periappendiceal collection: Not present. VASCULAR/LYMPHATIC: Aortoiliac vessels are normal in course and caliber. Mild calcific atherosclerosis. No lymphadenopathy by CT size criteria. REPRODUCTIVE: Normal. OTHER: No intraperitoneal free fluid or free air. MUSCULOSKELETAL: Nonacute. Streak artifact from RIGHT hip total arthroplasty. Small bilateral fat containing inguinal hernias. IMPRESSION: 1. Acute appendicitis without immediate complication. 2. Acute findings discussed with and reconfirmed by PA.BRANDON MORELLI on 07/23/2017 at  2:07 pm.  Aortic Atherosclerosis (ICD10-I70.0). Electronically Signed   By: Elon Alas M.D.   On: 07/23/2017 14:07   Dg Chest Port 1 View  Result Date: 07/23/2017 CLINICAL DATA:  Chest pain EXAM: PORTABLE CHEST 1 VIEW COMPARISON:  March 11, 2016 FINDINGS: There is fibrotic change in the bases. There is no edema or consolidation. Heart is mildly enlarged with pulmonary vascularity normal. No adenopathy. There is postoperative change in the lower cervical region. IMPRESSION: Bibasilar fibrosis. No edema or consolidation. Stable cardiomegaly. Electronically Signed   By: Lowella Grip III M.D.   On: 07/23/2017 12:58      Assessment/Plan  Hx of Hep C Hx of A fib Hx of polysubstance abuse Hx of seizures Chronic back pain Bipolar & Depression HTN RA on Humera and methotrexate  Appendicitis - Or today - continue flagyl and rocephin  FEN: NPO VTE: SCD's, okay for lovenox tomorrow ID: Rocephin & Flagyl 06/12>> Follow up: TBD  Plan: medicine asked to assist with pt's comorbities, appreciate their assistance. OR today for lap appy  Kalman Drape, [facility] Surgery 07/23/2017, 3:19 PM Pager: [phone] Consults: [phone] Mon-Fri 7:00 am-4:30 pm Sat-Sun 7:00 am-11:30 am

## 2017-07-23 NOTE — Anesthesia Procedure Notes (Signed)
Procedure Name: Intubation Date/Time: 07/23/2017 4:27 PM Performed by: Mariea Clonts, CRNA Pre-anesthesia Checklist: Patient identified, Emergency Drugs available, Suction available and Patient being monitored Patient Re-evaluated:Patient Re-evaluated prior to induction Oxygen Delivery Method: Circle System Utilized Preoxygenation: Pre-oxygenation with 100% oxygen Induction Type: IV induction Ventilation: Mask ventilation without difficulty Laryngoscope Size: Glidescope and 4 Grade View: Grade I Tube type: Oral Tube size: 8.0 mm Number of attempts: 1 Airway Equipment and Method: Stylet and Oral airway Placement Confirmation: ETT inserted through vocal cords under direct vision,  positive ETCO2 and breath sounds checked- equal and bilateral Tube secured with: Tape Dental Injury: Teeth and Oropharynx as per pre-operative assessment

## 2017-07-23 NOTE — ED Triage Notes (Signed)
Pt. On the floor at triage in pain.

## 2017-07-23 NOTE — Progress Notes (Signed)
Blake Arnold is a 47 y.o. male with medical history significant of seizure d/o; RA; polysubstance abuse; HTN; Hep C; and afib presenting with abdominal pain, imaging concerning for acute appendicitis.   Has RA - on Humira and Methotrexate.  H/o afib, currently in NSR, not on AC.  Has h/o polysubstance abuse, but denies recent use.  He did c/o some chest pain - but was taken to the OR before a troponin could even be drawn.  [facility] was initially called for admission, but the patient's primary complaint is surgical and so we will be the consulting team instead.  Additionally, I am unable to see the patient since he is already in the OR.  As a result, I have requested that TRH be reconsulted once the patient is placed in a regular room.    Karmen Bongo MD [facility]   07/23/2017, 3:33 PM

## 2017-07-23 NOTE — ED Triage Notes (Signed)
Pt. Stated, Blake Arnold had right side pain with stomach pain since last night.

## 2017-07-23 NOTE — H&P (Signed)
[facility] Admission Note  Blake Arnold [birth year]  [id number].    Requesting MD: Margarita Mail, PA-C Chief Complaint/Reason for Consult: appendicitis   HPI:  Pt is a 47 year old male with a PMHX of Hep C, A fib not anticoagulated, polysubstance abuse, seizures, chronic back pain, bipolar & Depression, HTN, RA on Humera and methotrexate who presented to the ED with complaints of abdominal pain since last night around 8pm. Pt states he had rapid onset, severe, non radiating, RLQ abdominal pain with associated diaphoresis, chills, nausea and self inflicted vomiting. Having normal bowel movements. Pt states he tried Copywriter, advertising and a hot bath without relief. Pt states 2 episodes of CP after onset of pain but he states this is normal for him. No other associated symptoms. Last meal was yesterday.   CT showed acute appendicitis. WBC 13.7  ROS:  Review of Systems  Constitutional: Positive for chills. Negative for diaphoresis and fever.  HENT: Negative for sore throat.   Respiratory: Negative for cough and shortness of breath.   Cardiovascular: Negative for chest pain.  Gastrointestinal: Positive for abdominal pain and nausea. Negative for blood in stool, constipation, diarrhea and vomiting.  Genitourinary: Negative for dysuria, frequency, hematuria and urgency.  Musculoskeletal: Positive for back pain (chronic).  Skin: Negative for rash.  Neurological: Negative for dizziness, focal weakness and loss of consciousness.  All other systems reviewed and are negative.    No family history on file.  Past Medical History:  Diagnosis Date  . A-fib ([place])   . Acid reflux   . Alcohol abuse   . Anxiety   . AVN (avascular necrosis of bone) ([place])   . Chronic back pain   . Chronic back pain   . Depression   . Hepatitis C   . Hepatitis C    history of  . History of urinary frequency   . Hypertension   . Peripheral neuropathy    hands and feet  . Polysubstance abuse ([place])  01/25/2011  . Rheumatoid arteritis   . Seizures ([place])     Past Surgical History:  Procedure Laterality Date  . CARDIOVERSION  03/07/2006    Social History:  reports that he has been smoking cigarettes.  He has a 25.00 pack-year smoking history. He quit smokeless tobacco use about 4 years ago. He reports that he does not drink alcohol or use drugs.  Allergies:  Allergies  Allergen Reactions  . Lisinopril Swelling     (Not in a hospital admission)  Blood pressure (!) 160/91, pulse (!) 58, temperature 97.7 F (36.5 C), temperature source Oral, resp. rate 20, height _0  (1.727 m), weight 115.2 kg (254 lb), SpO2 97 %.  Physical Exam  Constitutional: He is oriented to person, place, and time. He appears well-developed and well-nourished.  Non-toxic appearance. He does not appear ill. No distress.  HENT:  Head: Normocephalic and atraumatic.  Nose: Nose normal.  Mouth/Throat: Uvula is midline, oropharynx is clear and moist and mucous membranes are normal. No oropharyngeal exudate.  Eyes: Pupils are equal, round, and reactive to light. Conjunctivae and lids are normal. Right eye exhibits no discharge. Left eye exhibits no discharge. No scleral icterus.  Neck: Normal range of motion. Neck supple. No thyromegaly present.  Cardiovascular: Normal rate, regular rhythm, normal heart sounds and intact distal pulses.  No murmur heard. Pulses:      Radial pulses are 2+ on the right side, and 2+ on the left side.  Dorsalis pedis pulses are 2+ on the right side, and 2+ on the left side.  Pulmonary/Chest: Effort normal and breath sounds normal. No respiratory distress. He has no wheezes. He has no rhonchi. He has no rales.  Abdominal: Soft. Normal appearance and bowel sounds are normal. He exhibits no distension. There is no hepatosplenomegaly. There is generalized tenderness and tenderness in the right lower quadrant. There is tenderness at McBurney's point. There is no rigidity and no  guarding.  Musculoskeletal: Normal range of motion. He exhibits no edema, tenderness or deformity.  Lymphadenopathy:    He has no cervical adenopathy.  Neurological: He is alert and oriented to person, place, and time.  Skin: Skin is warm and dry. No rash noted. He is not diaphoretic.  Psychiatric: He has a normal mood and affect.  Nursing note and vitals reviewed.   Results for orders placed or performed during the hospital encounter of 07/23/17 (from the past 48 hour(s))  Urinalysis, Routine w reflex microscopic     Status: Abnormal   Collection Time: 07/23/17  8:16 AM  Result Value Ref Range   Color, Urine AMBER (A) YELLOW    Comment: BIOCHEMICALS MAY BE AFFECTED BY COLOR   APPearance HAZY (A) CLEAR   Specific Gravity, Urine 1.029 1.005 - 1.030   pH 7.0 5.0 - 8.0   Glucose, UA NEGATIVE NEGATIVE mg/dL   Hgb urine dipstick NEGATIVE NEGATIVE   Bilirubin Urine SMALL (A) NEGATIVE   Ketones, ur NEGATIVE NEGATIVE mg/dL   Protein, ur 100 (A) NEGATIVE mg/dL   Nitrite NEGATIVE NEGATIVE   Leukocytes, UA NEGATIVE NEGATIVE   RBC / HPF 0-5 0 - 5 RBC/hpf   WBC, UA 0-5 0 - 5 WBC/hpf   Bacteria, UA NONE SEEN NONE SEEN   Squamous Epithelial / LPF 0-5 0 - 5   Mucus PRESENT     Comment: Performed at [facility], [address], [place], [place] [address]  Lipase, blood     Status: None   Collection Time: 07/23/17  8:19 AM  Result Value Ref Range   Lipase 28 11 - 51 U/L    Comment: Performed at [facility] [address], [place], [place] [address]  Comprehensive metabolic panel     Status: Abnormal   Collection Time: 07/23/17  8:19 AM  Result Value Ref Range   Sodium 135 135 - 145 mmol/L   Potassium 3.7 3.5 - 5.1 mmol/L   Chloride 100 (L) 101 - 111 mmol/L   CO2 24 22 - 32 mmol/L   Glucose, Bld 105 (H) 65 - 99 mg/dL   BUN 7 6 - 20 mg/dL   Creatinine, Ser 0.96 0.61 - 1.24 mg/dL   Calcium 9.6 8.9 - 10.3 mg/dL   Total Protein 7.4 6.5 - 8.1 g/dL   Albumin 4.2 3.5 - 5.0 g/dL    AST 35 15 - 41 U/L   ALT 68 (H) 17 - 63 U/L   Alkaline Phosphatase 57 38 - 126 U/L   Total Bilirubin 0.7 0.3 - 1.2 mg/dL   GFR calc non Af Amer >60 >60 mL/min   GFR calc Af Amer >60 >60 mL/min    Comment: (NOTE) The eGFR has been calculated using the CKD EPI equation. This calculation has not been validated in all clinical situations. eGFR's persistently <60 mL/min signify possible Chronic Kidney Disease.    Anion gap 11 5 - 15    Comment: Performed at [facility]  [address], [place], [place] [address]  CBC     Status: Abnormal   Collection Time: 07/23/17  8:19 AM  Result Value Ref Range   WBC 13.7 (H) 4.0 - 10.5 K/uL   RBC 5.17 4.22 - 5.81 MIL/uL   Hemoglobin 17.3 (H) 13.0 - 17.0 g/dL   HCT 49.9 39.0 - 52.0 %   MCV 96.5 78.0 - 100.0 fL   MCH 33.5 26.0 - 34.0 pg   MCHC 34.7 30.0 - 36.0 g/dL   RDW 13.6 11.5 - 15.5 %   Platelets 251 150 - 400 K/uL    Comment: Performed at [facility], [place] [address] [place], [place] [address]  Rapid urine drug screen (hospital performed)     Status: None   Collection Time: 07/23/17  1:54 PM  Result Value Ref Range   Opiates NONE DETECTED NONE DETECTED   Cocaine NONE DETECTED NONE DETECTED   Benzodiazepines NONE DETECTED NONE DETECTED   Amphetamines NONE DETECTED NONE DETECTED   Tetrahydrocannabinol NONE DETECTED NONE DETECTED   Barbiturates NONE DETECTED NONE DETECTED    Comment: (NOTE) DRUG SCREEN FOR MEDICAL PURPOSES ONLY.  IF CONFIRMATION IS NEEDED FOR ANY PURPOSE, NOTIFY LAB WITHIN 5 DAYS. LOWEST DETECTABLE LIMITS FOR URINE DRUG SCREEN Drug Class                     Cutoff (ng/mL) Amphetamine and metabolites    1000 Barbiturate and metabolites    200 Benzodiazepine                 153 Tricyclics and metabolites     300 Opiates and metabolites        300 Cocaine and metabolites        300 THC                            50 Performed at [facility], [place] [address], [place],  [address]    Ct  Abdomen Pelvis W Contrast  Result Date: 07/23/2017 CLINICAL DATA:  Diffuse abdominal pain. History of polysubstance abuse, hepatitis C. EXAM: CT ABDOMEN AND PELVIS WITH CONTRAST TECHNIQUE: Multidetector CT imaging of the abdomen and pelvis was performed using the standard protocol following bolus administration of intravenous contrast. CONTRAST:  183m OMNIPAQUE IOHEXOL 300 MG/ML  SOLN COMPARISON:  CT abdomen and pelvis February 25, 2012 FINDINGS: LOWER CHEST: Lung bases are clear. Included heart size is normal. No pericardial effusion. HEPATOBILIARY: Liver and gallbladder are normal. PANCREAS: Normal. SPLEEN: Normal. ADRENALS/URINARY TRACT: Kidneys are orthotopic, demonstrating symmetric enhancement. No nephrolithiasis, hydronephrosis or solid renal masses. The unopacified ureters are normal in course and caliber. Delayed imaging through the kidneys demonstrates symmetric prompt contrast excretion within the proximal urinary collecting system. Urinary bladder is decompressed and unremarkable. Normal adrenal glands. STOMACH/BOWEL: The stomach, small and large bowel are normal in course and caliber without inflammatory changes. Mild colonic diverticulosis. Appendix: Location: RIGHT mid abdomen extending midline. Diameter: 13. Appendicolith: 2 appendicoliths measuring 8 mm. Mucosal hyper-enhancement: Present, with periappendiceal flat stranding. Extraluminal gas: None present. Periappendiceal collection: Not present. VASCULAR/LYMPHATIC: Aortoiliac vessels are normal in course and caliber. Mild calcific atherosclerosis. No lymphadenopathy by CT size criteria. REPRODUCTIVE: Normal. OTHER: No intraperitoneal free fluid or free air. MUSCULOSKELETAL: Nonacute. Streak artifact from RIGHT hip total arthroplasty. Small bilateral fat containing inguinal hernias. IMPRESSION: 1. Acute appendicitis without immediate complication. 2. Acute findings discussed with and reconfirmed by PA.BRANDON MORELLI on 07/23/2017 at  2:07 pm.  Aortic Atherosclerosis (ICD10-I70.0). Electronically Signed   By: Elon Alas M.D.   On: 07/23/2017 14:07   Dg Chest Port 1 View  Result Date: 07/23/2017 CLINICAL DATA:  Chest pain EXAM: PORTABLE CHEST 1 VIEW COMPARISON:  March 11, 2016 FINDINGS: There is fibrotic change in the bases. There is no edema or consolidation. Heart is mildly enlarged with pulmonary vascularity normal. No adenopathy. There is postoperative change in the lower cervical region. IMPRESSION: Bibasilar fibrosis. No edema or consolidation. Stable cardiomegaly. Electronically Signed   By: Lowella Grip III M.D.   On: 07/23/2017 12:58      Assessment/Plan  Hx of Hep C Hx of A fib Hx of polysubstance abuse Hx of seizures Chronic back pain Bipolar & Depression HTN RA on Humera and methotrexate  Appendicitis - Or today - continue flagyl and rocephin  FEN: NPO VTE: SCD's, okay for lovenox tomorrow ID: Rocephin & Flagyl 06/12>> Follow up: TBD  Plan: medicine asked to assist with pt's comorbities, appreciate their assistance. OR today for lap appy  Kalman Drape, [facility] 07/23/2017, 3:19 PM Pager: [phone] Consults: [phone] Mon-Fri 7:00 am-4:30 pm Sat-Sun 7:00 am-11:30 am

## 2017-07-23 NOTE — ED Notes (Signed)
Pt's mother at desk stating pt is having RT sided stomach pain

## 2017-07-24 ENCOUNTER — Encounter (HOSPITAL_COMMUNITY): Payer: Self-pay | Admitting: General Surgery

## 2017-07-24 DIAGNOSIS — I480 Paroxysmal atrial fibrillation: Secondary | ICD-10-CM

## 2017-07-24 DIAGNOSIS — F1910 Other psychoactive substance abuse, uncomplicated: Secondary | ICD-10-CM

## 2017-07-24 DIAGNOSIS — I10 Essential (primary) hypertension: Secondary | ICD-10-CM

## 2017-07-24 DIAGNOSIS — R569 Unspecified convulsions: Secondary | ICD-10-CM

## 2017-07-24 DIAGNOSIS — K3580 Unspecified acute appendicitis: Principal | ICD-10-CM

## 2017-07-24 DIAGNOSIS — F319 Bipolar disorder, unspecified: Secondary | ICD-10-CM

## 2017-07-24 DIAGNOSIS — R079 Chest pain, unspecified: Secondary | ICD-10-CM

## 2017-07-24 DIAGNOSIS — M069 Rheumatoid arthritis, unspecified: Secondary | ICD-10-CM | POA: Diagnosis present

## 2017-07-24 LAB — LIPID PANEL
CHOLESTEROL: 152 mg/dL (ref 0–200)
HDL: 51 mg/dL (ref >40)
LDL Cholesterol: 85 mg/dL (ref 0–99)
Total CHOL/HDL Ratio: 3 RATIO
Triglycerides: 78 mg/dL (ref <150)
VLDL: 16 mg/dL (ref 0–40)

## 2017-07-24 LAB — CBC
HEMATOCRIT: 47.6 % (ref 39.0–52.0)
HEMOGLOBIN: 16 g/dL (ref 13.0–17.0)
MCH: 33.6 pg (ref 26.0–34.0)
MCHC: 33.6 g/dL (ref 30.0–36.0)
MCV: 100 fL (ref 78.0–100.0)
Platelets: 199 10*3/uL (ref 150–400)
RBC: 4.76 MIL/uL (ref 4.22–5.81)
RDW: 13.9 % (ref 11.5–15.5)
WBC: 19.9 10*3/uL — AB (ref 4.0–10.5)

## 2017-07-24 LAB — HEMOGLOBIN A1C
Hgb A1c MFr Bld: 5.1 % (ref 4.8–5.6)
Mean Plasma Glucose: 99.67 mg/dL

## 2017-07-24 LAB — TROPONIN I
Troponin I: <0.03 ng/mL (ref <0.03)
Troponin I: <0.03 ng/mL (ref <0.03)

## 2017-07-24 LAB — HIV ANTIBODY (ROUTINE TESTING W REFLEX): HIV Screen 4th Generation wRfx: NONREACTIVE

## 2017-07-24 MED ORDER — ADALIMUMAB 40 MG/0.8ML ~~LOC~~ PSKT: 0.8000 mL | PREFILLED_SYRINGE | SUBCUTANEOUS | 11 refills | Status: DC

## 2017-07-24 MED ORDER — OXYCODONE HCL 5 MG PO TABS: 5.0000 mg | ORAL_TABLET | Freq: Four times a day (QID) | ORAL | 0 refills | Status: DC | PRN

## 2017-07-24 NOTE — Progress Notes (Signed)
PROGRESS NOTE  Blake Arnold KDX:[id number] DOB: 03/30/1970 DOA: 07/23/2017 PCP: Elbert Ewings, FNP   LOS: 0 days   Brief Narrative / Interim history: 47 y.o. male with a past medical history of hypertension, GERD, depression, bipolar disorder, anxiety, polysubstance abuse (cocaine, alcohol, tobacco), seizure, RA on Humira and methotrexate, atrial fibrillation not on anticoagulants, HCV (completed antiviral treatment 2017), who presents with right lower quadrant abdominal pain. Pt was found to have acute appendicitis by CT scan.  Patient is s/p of appendectomy.  We are asked to consult on this patient since patient has several chronic medical issues.  Assessment & Plan: Principal Problem:   Appendicitis Active Problems:   FIBRILLATION, ATRIAL   TOBACCO ABUSE   Alcohol abuse   Chest pain   Polysubstance abuse ([facility])   Major depression   Bipolar disorder, unspecified ([facility])   HTN (hypertension)   Seizure ([facility])   RA (rheumatoid arthritis) (Blake Arnold)   Appendicitis -Post appendicectomy by general surgery, discussed with Dr. Lavone Neri, plan for home discharge today.  His medical issues seem stable.  Paroxysmal A. fib -He is in sinus rhythm now, his A. fib gave him more problems when he was abusing alcohol and cocaine.  He is sober now.  He remains in sinus on telemetry  Polysubstance abuse  -Patient has been sober and has not used alcohol or drugs for a few years.  There was mention of an alcohol level being elevated however that was back in 2015.  Was placed on CIWA and he did not trigger  Atypical chest pain, 2 episodes lasting 2 to 3 seconds -Cardiac enzymes negative, no further chest pain  Depression -Continue home medications  Seizure disorder -Continue medications  Hypertension -Continue home medications  Rheumatoid arthritis -Continue home medications  Stable for discharge from medical standpoint  Procedures:  S/p lap appy  Subjective: - no chest pain, shortness of  breath, no abdominal pain, nausea or vomiting.   Objective: Vitals:   07/23/17 1846 07/23/17 2044 07/24/17 0141 07/24/17 0507  BP: (!) 120/92 136/70 105/75 113/66  Pulse: 83 87 85 91  Resp:  17 16   Temp: 98 F (36.7 C) (!) 97.5 F (36.4 C) 99.1 F (37.3 C) 98.8 F (37.1 C)  TempSrc: Oral Oral Oral Oral  SpO2: 99% 94% 93% 92%  Weight:      Height:        Intake/Output Summary (Last 24 hours) at 07/24/2017 1241 Last data filed at 07/24/2017 0700 Gross per 24 hour  Intake 4035 ml  Output 2330 ml  Net 1705 ml   Filed Weights   07/23/17 0813 07/23/17 1548  Weight: 115.2 kg (254 lb) 115.2 kg (254 lb)    Examination:  Constitutional: NAD Eyes: lids and conjunctivae normal Neck: normal, supple Respiratory: clear to auscultation bilaterally, no wheezing, no crackles.  Cardiovascular: Regular rate and rhythm, no murmurs / rubs / gallops. No LE edema.  Abdomen: no tenderness. Bowel sounds positive.  Neurologic: CN 2-12 grossly intact. Strength 5/5 in all 4.    Data Reviewed: I have independently reviewed following labs and imaging studies   CBC: Recent Labs  Lab 07/23/17 0819 07/23/17 1928 07/24/17 0314  WBC 13.7* 13.6* 19.9*  HGB 17.3* 17.5* 16.0  HCT 49.9 50.4 47.6  MCV 96.5 97.9 100.0  PLT 251 242 976   Basic Metabolic Panel: Recent Labs  Lab 07/23/17 0819 07/23/17 1928  NA 135  --   K 3.7  --   CL 100*  --  CO2 24  --   GLUCOSE 105*  --   BUN 7  --   CREATININE 0.96 1.07  CALCIUM 9.6  --    GFR: Estimated Creatinine Clearance: 104 mL/min (by C-G formula based on SCr of 1.07 mg/dL). Liver Function Tests: Recent Labs  Lab 07/23/17 0819  AST 35  ALT 68*  ALKPHOS 57  BILITOT 0.7  PROT 7.4  ALBUMIN 4.2   Recent Labs  Lab 07/23/17 0819  LIPASE 28   No results for input(s): AMMONIA in the last 168 hours. Coagulation Profile: No results for input(s): INR, PROTIME in the last 168 hours. Cardiac Enzymes: Recent Labs  Lab 07/23/17 2034  07/24/17 0314 07/24/17 0919  TROPONINI <0.03 <0.03 <0.03   BNP (last 3 results) No results for input(s): PROBNP in the last 8760 hours. HbA1C: Recent Labs    07/24/17 0314  HGBA1C 5.1   CBG: No results for input(s): GLUCAP in the last 168 hours. Lipid Profile: Recent Labs    07/24/17 0314  CHOL 152  HDL 51  LDLCALC 85  TRIG 78  CHOLHDL 3.0   Thyroid Function Tests: No results for input(s): TSH, T4TOTAL, FREET4, T3FREE, THYROIDAB in the last 72 hours. Anemia Panel: No results for input(s): VITAMINB12, FOLATE, FERRITIN, TIBC, IRON, RETICCTPCT in the last 72 hours. Urine analysis:    Component Value Date/Time   COLORURINE AMBER (A) 07/23/2017 0816   APPEARANCEUR HAZY (A) 07/23/2017 0816   LABSPEC 1.029 07/23/2017 0816   PHURINE 7.0 07/23/2017 0816   GLUCOSEU NEGATIVE 07/23/2017 0816   HGBUR NEGATIVE 07/23/2017 0816   BILIRUBINUR SMALL (A) 07/23/2017 0816   KETONESUR NEGATIVE 07/23/2017 0816   PROTEINUR 100 (A) 07/23/2017 0816   UROBILINOGEN 0.2 11/27/2012 0946   NITRITE NEGATIVE 07/23/2017 0816   LEUKOCYTESUR NEGATIVE 07/23/2017 0816   Sepsis Labs: Invalid input(s): PROCALCITONIN, LACTICIDVEN  No results found for this or any previous visit (from the past 240 hour(s)).    Radiology Studies: Ct Abdomen Pelvis W Contrast  Result Date: 07/23/2017 CLINICAL DATA:  Diffuse abdominal pain. History of polysubstance abuse, hepatitis C. EXAM: CT ABDOMEN AND PELVIS WITH CONTRAST TECHNIQUE: Multidetector CT imaging of the abdomen and pelvis was performed using the standard protocol following bolus administration of intravenous contrast. CONTRAST:  117mL OMNIPAQUE IOHEXOL 300 MG/ML  SOLN COMPARISON:  CT abdomen and pelvis February 25, 2012 FINDINGS: LOWER CHEST: Lung bases are clear. Included heart size is normal. No pericardial effusion. HEPATOBILIARY: Liver and gallbladder are normal. PANCREAS: Normal. SPLEEN: Normal. ADRENALS/URINARY TRACT: Kidneys are orthotopic,  demonstrating symmetric enhancement. No nephrolithiasis, hydronephrosis or solid renal masses. The unopacified ureters are normal in course and caliber. Delayed imaging through the kidneys demonstrates symmetric prompt contrast excretion within the proximal urinary collecting system. Urinary bladder is decompressed and unremarkable. Normal adrenal glands. STOMACH/BOWEL: The stomach, small and large bowel are normal in course and caliber without inflammatory changes. Mild colonic diverticulosis. Appendix: Location: RIGHT mid abdomen extending midline. Diameter: 13. Appendicolith: 2 appendicoliths measuring 8 mm. Mucosal hyper-enhancement: Present, with periappendiceal flat stranding. Extraluminal gas: None present. Periappendiceal collection: Not present. VASCULAR/LYMPHATIC: Aortoiliac vessels are normal in course and caliber. Mild calcific atherosclerosis. No lymphadenopathy by CT size criteria. REPRODUCTIVE: Normal. OTHER: No intraperitoneal free fluid or free air. MUSCULOSKELETAL: Nonacute. Streak artifact from RIGHT hip total arthroplasty. Small bilateral fat containing inguinal hernias. IMPRESSION: 1. Acute appendicitis without immediate complication. 2. Acute findings discussed with and reconfirmed by PA.BRANDON MORELLI on 07/23/2017 at 2:07 pm. Aortic Atherosclerosis (ICD10-I70.0). Electronically Signed  By: Elon Alas M.D.   On: 07/23/2017 14:07   Dg Chest Port 1 View  Result Date: 07/23/2017 CLINICAL DATA:  Chest pain EXAM: PORTABLE CHEST 1 VIEW COMPARISON:  March 11, 2016 FINDINGS: There is fibrotic change in the bases. There is no edema or consolidation. Heart is mildly enlarged with pulmonary vascularity normal. No adenopathy. There is postoperative change in the lower cervical region. IMPRESSION: Bibasilar fibrosis. No edema or consolidation. Stable cardiomegaly. Electronically Signed   By: Lowella Grip III M.D.   On: 07/23/2017 12:58     Scheduled Meds: . amLODipine  5 mg Oral QHS   . aspirin EC  81 mg Oral Daily  . benztropine  0.5 mg Oral BID  . carbamazepine  200 mg Oral BID  . enoxaparin (LOVENOX) injection  40 mg Subcutaneous Q24H  . famotidine  20 mg Oral Daily  . folic acid  1 mg Oral Daily  . gabapentin  400 mg Oral TID  . hydrochlorothiazide  25 mg Oral Daily  . LORazepam  0-4 mg Intravenous Q6H   Followed by  . [START ON 07/25/2017] LORazepam  0-4 mg Intravenous Q12H  . metoprolol tartrate  50 mg Oral BID  . multivitamin with minerals  1 tablet Oral Daily  . nicotine  21 mg Transdermal QHS  . risperiDONE  2 mg Oral QHS  . sertraline  100 mg Oral QHS  . thiamine  100 mg Oral Daily   Or  . thiamine  100 mg Intravenous Daily   Continuous Infusions: . sodium chloride 100 mL/hr (07/24/17 0519)  . cefTRIAXone (ROCEPHIN)  IV     And  . metronidazole Stopped (07/24/17 0747)  . lactated ringers 10 mL/hr at 07/23/17 [place], MD, PhD [facility] Pager [phone]  If 7PM-7AM, please contact night-coverage [email or web address] Password [facility] 07/24/2017, 12:41 PM

## 2017-07-24 NOTE — Progress Notes (Signed)
Discharge paperwork reviewed with patient. No questions verbalized. IV removed.  Patient is ready to discharge.

## 2017-07-24 NOTE — Progress Notes (Signed)
Patient discharged to home with instructions. 

## 2017-07-24 NOTE — Discharge Summary (Signed)
Patient ID: Blake Arnold [id number] [birth year] 47 y.o.  Admit date: 07/23/2017 Discharge date: 07/24/2017  Admitting Diagnosis: Acute appendicitis HTN RA H/o polysubstance abuse Bipolar disorder Atrial fibrillation Seizure disorder  Discharge Diagnosis Patient Active Problem List   Diagnosis Date Noted  . HTN (hypertension) 07/24/2017  . Seizure ([place]) 07/24/2017  . RA (rheumatoid arthritis) ([place]) 07/24/2017  . Appendicitis 07/23/2017  . Alcohol abuse with alcohol-induced mood disorder ([place]) 07/26/2013  . Bipolar disorder, unspecified ([place]) 10/28/2012  . Alcohol dependence ([place]) 04/02/2012  . Alcohol withdrawal ([place]) 04/02/2012  . Major depression 04/02/2012  . Legal problem 04/22/2011  . Polysubstance abuse ([place]) 01/25/2011  . Bronchiolitis 01/25/2011  . Alcohol abuse 01/22/2011  . Chest pain 01/22/2011  . TOBACCO ABUSE 02/09/2010  . SUBSTANCE ABUSE 02/09/2010  . FIBRILLATION, ATRIAL 06/08/2009    Consultants hospitalist   Reason for Admission: Pt is a 47 year old male with a PMHX of Hep C, A fib not anticoagulated, polysubstance abuse, seizures, chronic back pain, bipolar & Depression, HTN, RA on Humera and methotrexate who presented to the ED with complaints of abdominal pain since last night around 8pm. Pt states he had rapid onset, severe, non radiating, RLQ abdominal pain with associated diaphoresis, chills, nausea and self inflicted vomiting. Having normal bowel movements. Pt states he tried Copywriter, advertising and a hot bath without relief. Pt states 2 episodes of CP after onset of pain but he states this is normal for him. No other associated symptoms. Last meal was yesterday.   CT showed acute appendicitis. WBC 13.7  Procedures Lap appy  Hospital Course:  The patient was admitted and underwent a laparoscopic appendectomy.  The patient tolerated the procedure well.  On POD 1, the patient was tolerating a regular diet, voiding well, mobilizing, and pain was  controlled with oral pain medications.  His medical problems remained stable. His humira was held for one additional week after surgery as well.  The patient was stable for DC home at this time with appropriate follow up made.     Physical Exam: Heart: regular Lungs: CTAB Abd: soft, appropriately tender, +BS, obese, incisions c/d/i with dermabond present.  Allergies as of 07/24/2017      Reactions   Lisinopril Swelling      Medication List    STOP taking these medications   traMADol 50 MG tablet Commonly known as:  ULTRAM     TAKE these medications   acamprosate 333 MG tablet Commonly known as:  CAMPRAL Take 2 tablets (666 mg total) by mouth 3 (three) times daily with meals. For alcohol dependence   Adalimumab 40 MG/0.8ML Pskt Commonly known as:  HUMIRA Inject 0.8 mLs (40 mg total) into the skin once a week. tuesdays Start taking on:  08/05/2017 What changed:  These instructions start on 08/05/2017. If you are unsure what to do until then, ask your doctor or other care provider.   Albuterol Sulfate 108 (90 Base) MCG/ACT Aepb Commonly known as:  PROAIR RESPICLICK Inhale 2 puffs into the lungs every 4 (four) hours as needed (wheezing; cough).   amLODipine 5 MG tablet Commonly known as:  NORVASC Take 5 mg by mouth at bedtime.   benztropine 0.5 MG tablet Commonly known as:  COGENTIN Take 0.5 mg by mouth 2 (two) times daily.   diclofenac 50 MG EC tablet Commonly known as:  VOLTAREN Take 1 tablet (50 mg total) by mouth 3 (three) times daily as needed (joint pain/ inflammation).   gabapentin 400  MG capsule Commonly known as:  NEURONTIN Take 400 mg by mouth 3 (three) times daily.   gabapentin 300 MG capsule Commonly known as:  NEURONTIN Take 1 capsule (300 mg total) by mouth 2 (two) times daily. For substance withdrawal syndrome   hydrochlorothiazide 25 MG tablet Commonly known as:  HYDRODIURIL Take 25 mg by mouth daily.   lisinopril 10 MG tablet Commonly known as:   PRINIVIL,ZESTRIL Take 1 tablet (10 mg total) by mouth daily.   METHOTREXATE (PF) [place] Inject 1 mL into the skin once a week. fridays   metoprolol tartrate 50 MG tablet Commonly known as:  LOPRESSOR Take 50 mg by mouth 2 (two) times daily.   ondansetron 4 MG tablet Commonly known as:  ZOFRAN Take 1 tablet (4 mg total) by mouth every 6 (six) hours.   oxyCODONE 5 MG immediate release tablet Commonly known as:  Oxy IR/ROXICODONE Take 1 tablet (5 mg total) by mouth every 6 (six) hours as needed for moderate pain.   ranitidine 150 MG tablet Commonly known as:  ZANTAC Take 150 mg by mouth 2 (two) times daily.   risperiDONE 2 MG tablet Commonly known as:  RISPERDAL Take 1 tablet (2 mg total) by mouth at bedtime. For mood control   sertraline 100 MG tablet Commonly known as:  ZOLOFT Take 100 mg by mouth at bedtime.        Follow-up Information    Surgery, [place]. Call in 2 week(s).   Specialty:  General Surgery Why:  call to verify your appointment that is being made for you for follow up in 2-3 weeks Contact information: [address] [place] [address] [phone]           Signed: Saverio Danker, [facility] 07/24/2017, 8:53 AM Pager: [phone]

## 2017-07-24 NOTE — Discharge Instructions (Signed)
please arrive at least 30 min before your appointment to complete your check in paperwork.  If you are unable to arrive 30 min prior to your appointment time we may have to cancel or reschedule you.  LAPAROSCOPIC SURGERY: POST OP INSTRUCTIONS  1. DIET: Follow a light bland diet the first 24 hours after arrival home, such as soup, liquids, crackers, etc. Be sure to include lots of fluids daily. Avoid fast food or heavy meals as your are more likely to get nauseated. Eat a low fat the next few days after surgery.  2. Take your usually prescribed home medications unless otherwise directed. 3. PAIN CONTROL:  1. Pain is best controlled by a usual combination of three different methods TOGETHER:  i. Ice/Heat ii. Over the counter pain medication iii. Prescription pain medication 2. Most patients will experience some swelling and bruising around the incisions. Ice packs or heating pads (30-60 minutes up to 6 times a day) will help. Use ice for the first few days to help decrease swelling and bruising, then switch to heat to help relax tight/sore spots and speed recovery. Some people prefer to use ice alone, heat alone, alternating between ice & heat. Experiment to what works for you. Swelling and bruising can take several weeks to resolve.  3. It is helpful to take an over-the-counter pain medication regularly for the first few weeks. Choose one of the following that works best for you:  i. Naproxen (Aleve, etc) Two 220mg  tabs twice a day ii. Ibuprofen (Advil, etc) Three 200mg  tabs four times a day (every meal & bedtime) iii. Acetaminophen (Tylenol, etc) 500-650mg  four times a day (every meal & bedtime) 4. A prescription for pain medication (such as oxycodone, hydrocodone, etc) should be given to you upon discharge. Take your pain medication as prescribed.  i. If you are having problems/concerns with the prescription medicine (does not control pain, nausea, vomiting, rash, itching, etc), please call us [phone]  [phone] to see if we need to switch you to a different pain medicine that will work better for you and/or control your side effect better. ii. If you need a refill on your pain medication, please contact your pharmacy. They will contact our office to request authorization. Prescriptions will not be filled after 5 pm or on week-ends. 1. Avoid getting constipated. Between the surgery and the pain medications, it is common to experience some constipation. Increasing fluid intake and taking a fiber supplement (such as Metamucil, Citrucel, FiberCon, MiraLax, etc) 1-2 times a day regularly will usually help prevent this problem from occurring. A mild laxative (prune juice, Milk of Magnesia, MiraLax, etc) should be taken according to package directions if there are no bowel movements after 48 hours.  2. Watch out for diarrhea. If you have many loose bowel movements, simplify your diet to bland foods & liquids for a few days. Stop any stool softeners and decrease your fiber supplement. Switching to mild anti-diarrheal medications (Kayopectate, Pepto Bismol) can help. If this worsens or does not improve, please call us. 3. Wash / shower every day. You may shower over the dressings as they are waterproof. Continue to shower over incision(s) after the dressing is off. 4. Remove your waterproof bandages 5 days after surgery. You may leave the incision open to air. You may replace a dressing/Band-Aid to cover the incision for comfort if you wish.  5. ACTIVITIES as tolerated:  a. You may resume regular (light) daily activities beginning the next day--such as daily self-care, walking, climbing stairs--gradually  increasing activities as tolerated. If you can walk 30 minutes without difficulty, it is safe to try more intense activity such as jogging, treadmill, bicycling, low-impact aerobics, swimming, etc. b. Save the most intensive and strenuous activity for last such as sit-ups, heavy lifting, contact sports, etc Refrain  from any heavy lifting or straining until you are off narcotics for pain control.  c. DO NOT PUSH THROUGH PAIN. Let pain be your guide: If it hurts to do something, don't do it. Pain is your body warning you to avoid that activity for another week until the pain goes down. d. You may drive when you are no longer taking prescription pain medication, you can comfortably wear a seatbelt, and you can safely maneuver your car and apply brakes. e. Dennis Bast may have sexual intercourse when it is comfortable.  6. FOLLOW UP in our office  a. Please call [facility] at [phone] to set up an appointment to see your surgeon in the office for a follow-up appointment approximately 2-3 weeks after your surgery. b. Make sure that you call for this appointment the day you arrive home to insure a convenient appointment time.      10. IF YOU HAVE DISABILITY OR FAMILY LEAVE FORMS, BRING THEM TO THE               OFFICE FOR PROCESSING.   WHEN TO CALL us [phone]:  1. Poor pain control 2. Reactions / problems with new medications (rash/itching, nausea, etc)  3. Fever over 101.5 F (38.5 C) 4. Inability to urinate 5. Nausea and/or vomiting 6. Worsening swelling or bruising 7. Continued bleeding from incision. 8. Increased pain, redness, or drainage from the incision  The clinic staff is available to answer your questions during regular business hours (8:30am-5pm). Please dont hesitate to call and ask to speak to one of our nurses for clinical concerns.  If you have a medical emergency, go to the nearest emergency room or call 911.  A surgeon from [facility] is always on call at the [facility], [place], [place], [place], [place] [address] ?  MAIN: [phone] ? TOLL FREE: [phone] ?  FAX [phone]  [email or web address]

## 2017-07-24 NOTE — Anesthesia Postprocedure Evaluation (Signed)
Anesthesia Post Note  Patient: Blake Arnold  Procedure(s) Performed: APPENDECTOMY LAPAROSCOPIC (N/A )     Patient location during evaluation: PACU Anesthesia Type: General Level of consciousness: awake and alert Pain management: pain level controlled Vital Signs Assessment: post-procedure vital signs reviewed and stable Respiratory status: spontaneous breathing, nonlabored ventilation and respiratory function stable Cardiovascular status: blood pressure returned to baseline and stable Postop Assessment: no apparent nausea or vomiting Anesthetic complications: no    Last Vitals:  Vitals:   07/24/17 0141 07/24/17 0507  BP: 105/75 113/66  Pulse: 85 91  Resp: 16   Temp: 37.3 C 37.1 C  SpO2: 93% 92%    Last Pain:  Vitals:   07/24/17 0942  TempSrc:   PainSc: 0-No pain                 Catalina Gravel

## 2017-07-28 LAB — CULTURE, BLOOD (ROUTINE X 2)
CULTURE: NO GROWTH
CULTURE: NO GROWTH
SPECIAL REQUESTS: ADEQUATE
Special Requests: ADEQUATE

## 2017-08-19 NOTE — Unmapped (Signed)
Patient had emergency surgery to remove his appendix  He is going to see the doctor again for follow up on 7/15 to see when he might be able to restart the methotrexate  He has been off the methotrexate since the emergency surgery  He is doing well at this time since the surgery    Right now he has 2 vials of methotrexate on hand to use once the doctor clears him to restart    Rescheduling refill call for 3 weeks from now    Everlean Cherry CPHT  [facility]

## 2017-08-20 MED FILL — ALBUTEROL HFA/90MCG/AER: ALBUTEROL HFA/90MCG/AER | 25 days supply | Qty: 1 | Fill #6

## 2017-08-22 ENCOUNTER — Encounter: Admit: 2017-08-22 | Discharge: 2017-08-22 | Payer: MEDICARE

## 2017-08-22 ENCOUNTER — Encounter
Admit: 2017-08-22 | Discharge: 2017-08-22 | Payer: MEDICARE | Attending: Orthopaedic Surgery | Primary: Orthopaedic Surgery

## 2017-08-22 DIAGNOSIS — Z981 Arthrodesis status: Principal | ICD-10-CM

## 2017-08-22 DIAGNOSIS — M5412 Radiculopathy, cervical region: Secondary | ICD-10-CM

## 2017-08-22 NOTE — Unmapped (Signed)
??   Your diagnosis: Cervical Fusion    ?? Recommendations/Plan  ?? Medications: Take over-the-counter medications as needed and as tolerated  ?? If you don't have liver disease, the safest medication for pain is acetaminophen (Tylenol).  Take 1000mg  (2 extra strength tabs) at a time for pain relief.  Do not take more than 3000mg  per day.  ?? Activities as tolerated.  ?? Imaging studies: Cervical AP, lateral, flexion, and extension views (prior to next visit).  ?? Follow-up: February 2020     ?? Avoid nicotine/tobacco  ?? Minimize alcohol intake  ?? Maintain a healthy weight    ?? Stay physically active and exercise regularly as tolerated ([email or web address])  ?? Maintain good sleeping habits    ?? We want to improve, and you can help.  You may receive a survey asking you about your visit.  Please take a few minutes to complete the survey.  We will use your feedback to make improvements.    ?? Contact our team electronically via MyChart message to [facility] Clinical Staff.    ?? Contact our team at [phone] or [phone] with any questions/concerns.    ?? Please visit and like [place] on our Facebook page to learn more about our services.   [email or web address]

## 2017-08-23 NOTE — Unmapped (Signed)
I, Nemiah Commander, MD, saw and evaluated the patient, participating in the key elements of the service.  I discussed the findings, assessment and plan with the resident and agree with resident???s findings and plan as documented in the resident's note.

## 2017-08-23 NOTE — Unmapped (Signed)
ORTHOPAEDIC SPINE CLINIC NOTE       Joseph Commander, MD  Associate Professor  [email or web address]  [phone]        Patient Name:Joseph Sandoval  MRN: [id number]  DOB: 04-Jan-1971    Date: 08/22/2017    PCP: [facility]    ASSESSMENT:     Joseph Sandoval  47 y.o. male  [id number] Joseph Sandoval  Former contruction. Recov EtOH/15yrs. Applying for disability.  Neuropathy. Afib. COPD. OSA. Bipolar. Tob. RA. HepC.  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 No c-radic.  S/p C5-7 ACDF (Monday 03/31/17 at [facility])      PLAN:     ?? Medications: Take over-the-counter medications as needed and as tolerated  ?? If you don't have liver disease, the safest medication for pain is acetaminophen (Tylenol).  Take 1000mg  (2 extra strength tabs) at a time for pain relief.  Do not take more than 3000mg  per day.  ?? Activities as tolerated.  ?? Imaging studies: Cervical AP, lateral, flexion, and extension views (prior to next visit).  ?? Follow-up: February 2020      SUBJECTIVE:     Chief Complaint:  bilateral hand numbness    History of Present Illness:        08/22/17 1212   PainSc:   6     Patient continues to have issues with bilateral hand numbness, worse on L hand and worsening since last visit.  He states that the numbness wakes him from the right and prevents him from working in [employer]    Review of Systems:  Fever/chills: denies    Medical History   Past Medical History:   Diagnosis Date   ??? Alcoholism (CMS-HCC)    ??? Anxiety    ??? Arthritis    ??? Asthma    ??? Atrial fibrillation (CMS-HCC)    ??? Bipolar affective (CMS-HCC)    ??? Bronchitis    ??? Depression    ??? Headache    ??? Hepatitis C    ??? Hyperlipidemia    ??? Hypertension    ??? Infectious viral hepatitis    ??? Joint pain    ??? Lung disease    ??? Polysubstance abuse (CMS-HCC)    ??? RA (rheumatoid arthritis) (CMS-HCC)    ??? Seizures (CMS-HCC)    ??? Shoulder injury    ??? Sleep apnea, obstructive    ??? Sleeping difficulties       Surgical History   He  has a past surgical history that includes Cardiac surgery; pr total hip arthroplasty (Right, 12/28/2015); pr wrist arthroscop,release xvers lig (Left, 03/21/2016); pr nervous system surgery unlisted (N/A, 03/21/2016); pr rad excis wrist synov/tendon,flexor (Left, 03/21/2016); pr revise median n/carpal tunnel surg (Left, 10/08/2016); pr revise median n/carpal tunnel surg (Right, 10/22/2016); pr arthrodesis ant interbody inc discectomy, cervical below c2 (N/A, 03/31/2017); pr arthrodesis ant interbody inc discectomy, cervical below c2 each addl (N/A, 03/31/2017); pr anterior instrumentation 2-3 vertebral segments (N/A, 03/31/2017); pr insj biomchn dev intervertebral dsc spc w/arthrd (N/A, 03/31/2017); pr autograft spine surgery local from same incision (N/A, 03/31/2017); pr allograft for spine surgery only morselized (N/A, 03/31/2017); and pr ionm 1 on 1 in or w/attendance each 15 minutes (N/A, 03/31/2017).     Allergies   Lisinopril   Medications   Current Outpatient Medications   Medication Sig Dispense Refill   ??? acetaminophen (TYLENOL) 500  MG tablet Take 1,000 mg by mouth every six (6) hours as needed for pain.     ??? adalimumab (HUMIRA) 40 mg/0.8 mL injection Inject 0.8 mL (40 mg total) under the skin every seven (7) days. 1 each 2   ??? albuterol (PROAIR HFA) 90 mcg/actuation inhaler Inhale 2 puffs every six (6) hours as needed for wheezing. 3 Inhaler 6   ??? amLODIPine (NORVASC) 5 MG tablet Take 10 mg by mouth daily.      ??? benztropine (COGENTIN) 0.5 MG tablet Take 0.5 mg by mouth Two (2) times a day.     ??? carBAMazepine (TEGRETOL) 200 mg tablet Take 100 mg by mouth Two (2) times a day.      ??? gabapentin (NEURONTIN) 400 MG capsule Take 400 mg by mouth Three (3) times a day.     ??? hydroCHLOROthiazide (HYDRODIURIL) 25 MG tablet Take 25 mg by mouth daily.  4   ??? methotrexate 25 mg/mL injection solution Inject 1 mL (25 mg total) under the skin once a week. 4 mL 3   ??? metoprolol tartrate (LOPRESSOR) 25 MG tablet Take 50 mg by mouth Two (2) times a day.      ??? mometasone-formoterol 200-5 mcg/actuation HFAA Inhale 2 puffs Two (2) times a day. 36 g 6   ??? ranitidine (ZANTAC) 150 MG tablet Take 150 mg by mouth Two (2) times a day.     ??? risperiDONE (RISPERDAL) 2 MG tablet Take 2 mg by mouth nightly.     ??? sertraline (ZOLOFT) 50 MG tablet Take 100 mg by mouth daily.   1   ??? simvastatin (ZOCOR) 20 MG tablet Take 40 mg by mouth nightly.      ??? syringe with needle (TUBERCULIN SYRINGE) 1 mL 25 gauge x 5/8 Syrg 1 Units by Miscellaneous route every seven (7) days. To be used with methotrexate 100 Syringe 3   ??? hydroxychloroquine (PLAQUENIL) 200 mg tablet Take 1 tablet (200 mg total) by mouth Two (2) times a day. (Patient not taking: Reported on 07/15/2017) 60 tablet 6     No current facility-administered medications for this visit.       Social History   Social History     Social History Narrative   ??? Not on file     He  reports that he has been smoking cigarettes.  He started smoking about 33 years ago. He has a 30.00 pack-year smoking history. He has quit using smokeless tobacco. His smokeless tobacco use included snuff. He reports that he does not drink alcohol or use drugs.   The history recorded in the table above was reviewed.     OBJECTIVE:     PHYSICAL EXAM (6+pts):  Vitals: BP 144/97  - Pulse 67  - Temp 36.4 ??C  - Ht 172.7 cm (5' 7.99)  - Wt (!) 114.8 kg (253 lb)  - BMI 38.48 kg/m??   Appearance: well-nourished and no acute distress   Affect: alert and cooperative  Gait: normal  Extremities: Strength unchanged in bilat LE.  Strength unchanged in bilat UE.  Neurologic: Sensation to light touch diminished in L hand in median n distribution   Skin: No cyanosis, clubbing, and normal skin in bilat hands.  Incision healed.     MEDICAL DECISION MAKING    Test Results:  Imaging:   C-spine MRI. None available   Postoperative changes after C4-7 ACDF.  No significant cord compression  Discussion:  ?? Activities - Advised activities as tolerated, using pain  as a guide.    E&M Coding:  ?? 16109 - Global     cc: [place], Family Dola Factor*, [facility]

## 2017-09-09 NOTE — Unmapped (Signed)
Patient is doing well at this time.  He has one vial left to use this Friday  Sending shipment of methotrexate Friday  He doesn't need anything else at this time    [facility] Refill Coordination Note    Specialty Medication(s) to be Shipped:   Inflammatory Disorders: methotrexate vials    Other medication(s) to be shipped: n/a     [name], DOB: 1970/11/04  Phone: [phone] (home) [phone] (work)  Shipping Address: [address]  [place] [address]    All above HIPAA information was verified with patient.     Completed refill call assessment today to schedule patient's medication shipment from the [facility] [phone]).       Specialty medication(s) and dose(s) confirmed: Regimen is correct and unchanged.   Changes to medications: [name] reports no changes reported at this time.  Changes to insurance: No  Questions for the pharmacist: No    The patient will receive an FSI print out for each medication shipped and additional FDA Medication Guides as required.  Patient education from [place] or [name] may also be included in the shipment.    DISEASE/MEDICATION-SPECIFIC INFORMATION        For Rheumatology patients: Next dose of methotrexate from this shipment due on 8/9    ADHERENCE     Medication Adherence    Patient reported X missed doses in the last month:  0  Specialty Medication:  methotrexate vials  Patient is on additional specialty medications:  No  Patient is on more than two specialty medications:  No  Any gaps in refill history greater than 2 weeks in the last 3 months:  no  Demonstrates understanding of importance of adherence:  yes  Informant:  patient  Reliability of informant:  reliable  Adherence tools used:  calendar  Confirmed plan for next specialty medication refill:  delivery by pharmacy  Refills needed for supportive medications:  not needed          Refill Coordination    Has the Patients' Contact Information Changed:  No  Is the Shipping Address Different:  No         SHIPPING     Shipping address confirmed in FSI.     Delivery Scheduled: Yes, Expected medication delivery date: 8/2 via UPS or courier.     Renette Butters   [facility] Specialty Technician

## 2017-09-11 MED FILL — METHOTREXATE/25MG/ML/SOLN: METHOTREXATE/25MG/ML/SOLN | 28 days supply | Qty: 2 | Fill #2

## 2017-09-19 NOTE — Unmapped (Signed)
[facility]: Rheumatology Clinic Assessment Call    Specialty Medication(s): Subcutaneous Methotrexate and Humira Joseph Sandoval)  Indication(s): Rheumatoid Arthritis     Joseph Sandoval, DOB: 09-07-1970  Above HIPAA information was verified with patient.      Medications reviewed & verified: Allergies - Medications -      Specialty medication(s) & dose(s) confirmed: yes  Changes to medications: no  Tolerating medications:   Adverse Effects    Agitation:  Pos      Yes    CLINICAL ASSESSMENT     Joseph Sandoval. Joseph Sandoval reports tolerating Methotrexate relatively well, he reports he is irritable a few hours after each injection, but as he is frequently experiencing flares so he believes it may be attributable to pain as opposed to the medication.  Adherence to therapy confirmed with patient and refill Sandoval @ [facility].  Patient reports 2-3 missed dose(s) of Methotrexate the past 3 months, but this was related to emergency surgery for appendicitis in June.  Clinically, rheumatoid arthritis is not well controlled.  He reports at least one RA flare each month, primarily in his hands that presents as both inflammation and pain.  He reports that these flares can last from 2-3 days to more than one week in duration.  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   As therapy is ineffective, will message Dr. Irving Burton to see if she wants to see patient sooner or change therapy prior to next clinic visit.      Does [name] have follow up appointment scheduled with clinic? Yes, appointment is scheduled and patient is aware, current next appointment is 01/05/18    All questions were answered and contact information provided for any future questions/concerns.      Jeneen Montgomery RPh

## 2017-09-28 ENCOUNTER — Encounter (HOSPITAL_COMMUNITY): Payer: Self-pay

## 2017-09-28 ENCOUNTER — Emergency Department (HOSPITAL_COMMUNITY): Payer: Self-pay

## 2017-09-28 ENCOUNTER — Other Ambulatory Visit: Payer: Self-pay

## 2017-09-28 ENCOUNTER — Ambulatory Visit (HOSPITAL_COMMUNITY)
Admission: EM | Admit: 2017-09-28 | Discharge: 2017-09-28 | Disposition: A | Discharge status: Home / Self Care | Payer: Self-pay | Attending: Family Medicine | Admitting: Family Medicine

## 2017-09-28 ENCOUNTER — Encounter (HOSPITAL_COMMUNITY): Payer: Self-pay | Admitting: Emergency Medicine

## 2017-09-28 ENCOUNTER — Emergency Department (HOSPITAL_COMMUNITY)
Admission: EM | Admit: 2017-09-28 | Discharge: 2017-09-28 | Disposition: A | Discharge status: Home / Self Care | Payer: Self-pay | Attending: Emergency Medicine | Admitting: Emergency Medicine

## 2017-09-28 DIAGNOSIS — R1031 Right lower quadrant pain: Secondary | ICD-10-CM

## 2017-09-28 DIAGNOSIS — R110 Nausea: Secondary | ICD-10-CM

## 2017-09-28 DIAGNOSIS — Z79899 Other long term (current) drug therapy: Secondary | ICD-10-CM | POA: Insufficient documentation

## 2017-09-28 DIAGNOSIS — R109 Unspecified abdominal pain: Secondary | ICD-10-CM

## 2017-09-28 DIAGNOSIS — F17210 Nicotine dependence, cigarettes, uncomplicated: Secondary | ICD-10-CM | POA: Insufficient documentation

## 2017-09-28 DIAGNOSIS — M069 Rheumatoid arthritis, unspecified: Secondary | ICD-10-CM | POA: Insufficient documentation

## 2017-09-28 DIAGNOSIS — K5712 Diverticulitis of small intestine without perforation or abscess without bleeding: Secondary | ICD-10-CM | POA: Insufficient documentation

## 2017-09-28 DIAGNOSIS — I10 Essential (primary) hypertension: Secondary | ICD-10-CM | POA: Insufficient documentation

## 2017-09-28 HISTORY — DX: Unspecified asthma, uncomplicated: J45.909

## 2017-09-28 LAB — COMPREHENSIVE METABOLIC PANEL
ALBUMIN: 3.7 g/dL (ref 3.5–5.0)
ALK PHOS: 53 U/L (ref 38–126)
ALT: 36 U/L (ref 0–44)
AST: 18 U/L (ref 15–41)
Anion gap: 7 (ref 5–15)
BILIRUBIN TOTAL: 0.8 mg/dL (ref 0.3–1.2)
BUN: <5 mg/dL — ABNORMAL LOW (ref 6–20)
CALCIUM: 8.9 mg/dL (ref 8.9–10.3)
CO2: 25 mmol/L (ref 22–32)
Chloride: 105 mmol/L (ref 98–111)
Creatinine, Ser: 0.85 mg/dL (ref 0.61–1.24)
GFR calc Af Amer: >60 mL/min (ref >60)
GFR calc non Af Amer: >60 mL/min (ref >60)
GLUCOSE: 104 mg/dL — AB (ref 70–99)
Potassium: 3.7 mmol/L (ref 3.5–5.1)
SODIUM: 137 mmol/L (ref 135–145)
TOTAL PROTEIN: 7.2 g/dL (ref 6.5–8.1)

## 2017-09-28 LAB — CBC
HCT: 46 % (ref 39.0–52.0)
HEMOGLOBIN: 15.1 g/dL (ref 13.0–17.0)
MCH: 33.9 pg (ref 26.0–34.0)
MCHC: 32.8 g/dL (ref 30.0–36.0)
MCV: 103.1 fL — ABNORMAL HIGH (ref 78.0–100.0)
Platelets: 196 10*3/uL (ref 150–400)
RBC: 4.46 MIL/uL (ref 4.22–5.81)
RDW: 13.8 % (ref 11.5–15.5)
WBC: 12.4 10*3/uL — ABNORMAL HIGH (ref 4.0–10.5)

## 2017-09-28 LAB — URINALYSIS, ROUTINE W REFLEX MICROSCOPIC
Bacteria, UA: NONE SEEN
Bilirubin Urine: NEGATIVE
GLUCOSE, UA: NEGATIVE mg/dL
KETONES UR: NEGATIVE mg/dL
Leukocytes, UA: NEGATIVE
NITRITE: NEGATIVE
PH: 6 (ref 5.0–8.0)
Protein, ur: NEGATIVE mg/dL
Specific Gravity, Urine: 1.006 (ref 1.005–1.030)

## 2017-09-28 LAB — LIPASE, BLOOD: Lipase: 33 U/L (ref 11–51)

## 2017-09-28 MED ORDER — METRONIDAZOLE IN NACL 5-0.79 MG/ML-% IV SOLN
500.0000 mg | Freq: Once | INTRAVENOUS | Status: AC
  Administered 2017-09-28: 500 mg via INTRAVENOUS
  Filled 2017-09-28: qty 100

## 2017-09-28 MED ORDER — SODIUM CHLORIDE 0.9 % IV SOLN
1.0000 g | Freq: Once | INTRAVENOUS | Status: AC
  Administered 2017-09-28: 1 g via INTRAVENOUS
  Filled 2017-09-28: qty 10

## 2017-09-28 MED ORDER — SODIUM CHLORIDE 0.9 % IV BOLUS
1000.0000 mL | Freq: Once | INTRAVENOUS | Status: AC
  Administered 2017-09-28: 1000 mL via INTRAVENOUS

## 2017-09-28 MED ORDER — IOPAMIDOL (ISOVUE-300) INJECTION 61%
100.0000 mL | Freq: Once | INTRAVENOUS | Status: AC | PRN
  Administered 2017-09-28: 100 mL via INTRAVENOUS

## 2017-09-28 MED ORDER — AMOXICILLIN-POT CLAVULANATE 875-125 MG PO TABS: 1.0000 | ORAL_TABLET | Freq: Two times a day (BID) | ORAL | 0 refills | Status: DC

## 2017-09-28 MED ORDER — IOPAMIDOL (ISOVUE-300) INJECTION 61%
INTRAVENOUS | Status: AC
  Filled 2017-09-28: qty 100

## 2017-09-28 MED ORDER — MORPHINE SULFATE (PF) 4 MG/ML IV SOLN
4.0000 mg | Freq: Once | INTRAVENOUS | Status: AC
  Administered 2017-09-28: 4 mg via INTRAVENOUS
  Filled 2017-09-28: qty 1

## 2017-09-28 NOTE — ED Triage Notes (Signed)
Pt. Stated, Blake Arnold had stomach pain and gas really bad for 3 days. Ive also been nauseated.

## 2017-09-28 NOTE — ED Notes (Signed)
Declined W/C at D/C and was escorted to lobby by RN. 

## 2017-09-28 NOTE — ED Provider Notes (Signed)
[place]    CSN: [id number] Arrival date & time: 09/28/17  1012     History   Chief Complaint Chief Complaint  Patient presents with  . Abdominal Pain    middle and right lower quadrant    HPI Blake Arnold is a 47 y.o. male.   HPI   Patient was triaged into a room probably because of his complaint of "severe" abdominal pain.  His vital signs are stable.  He tells me he had a recent appendectomy.  He tells me that he has nausea, no vomiting.  Some diarrhea.  Severe crampy abdominal pain.  On examination he has guarding and rebound tenderness.  He is sent directly to the emergency room for care.  Past Medical History:  Diagnosis Date  . A-fib ([place])   . Acid reflux   . Alcohol abuse   . Anxiety   . Asthma   . AVN (avascular necrosis of bone) ([place])   . Chronic back pain   . Depression   . Hepatitis C    history of  . History of urinary frequency   . Hypertension   . Peripheral neuropathy    hands and feet  . Polysubstance abuse ([place]) 01/25/2011  . Rheumatoid arteritis   . Seizures [facility])     Patient Active Problem List   Diagnosis Date Noted  . HTN (hypertension) 07/24/2017  . Seizure ([place]) 07/24/2017  . RA (rheumatoid arthritis) ([place]) 07/24/2017  . Appendicitis 07/23/2017  . Alcohol abuse with alcohol-induced mood disorder ([place]) 07/26/2013  . Bipolar disorder, unspecified ([place]) 10/28/2012  . Alcohol dependence ([place]) 04/02/2012  . Alcohol withdrawal ([place]) 04/02/2012  . Major depression 04/02/2012  . Legal problem 04/22/2011  . Polysubstance abuse ([place]) 01/25/2011  . Bronchiolitis 01/25/2011  . Alcohol abuse 01/22/2011  . Chest pain 01/22/2011  . TOBACCO ABUSE 02/09/2010  . SUBSTANCE ABUSE 02/09/2010  . FIBRILLATION, ATRIAL 06/08/2009    Past Surgical History:  Procedure Laterality Date  . CARDIOVERSION  03/07/2006  . LAPAROSCOPIC APPENDECTOMY N/A 07/23/2017   Procedure: APPENDECTOMY LAPAROSCOPIC;  Surgeon: Georganna Skeans, MD;   Location: [place];  Service: General;  Laterality: N/A;       Home Medications    Prior to Admission medications   Medication Sig Start Date End Date Taking? Authorizing Provider  acamprosate (CAMPRAL) 333 MG tablet Take 2 tablets (666 mg total) by mouth 3 (three) times daily with meals. For alcohol dependence Patient not taking: Reported on 07/23/2017 07/27/13   Benjamine Mola, FNP  Adalimumab (HUMIRA) 40 MG/0.8ML PSKT Inject 0.8 mLs (40 mg total) into the skin once a week. tuesdays Patient taking differently: Inject 0.8 mLs into the skin once a week.  08/05/17 08/05/18  Saverio Danker, PA-C  Albuterol Sulfate (PROAIR RESPICLICK) 702 (90 BASE) MCG/ACT AEPB Inhale 2 puffs into the lungs every 4 (four) hours as needed (wheezing; cough). 11/04/14   Melony Overly, MD  amLODipine (NORVASC) 5 MG tablet Take 5 mg by mouth at bedtime. 07/14/17   [provider]  amoxicillin-clavulanate (AUGMENTIN) 875-125 MG tablet Take 1 tablet by mouth every 12 (twelve) hours. 09/28/17   Hayden Rasmussen, MD  benztropine (COGENTIN) 0.5 MG tablet Take 0.5 mg by mouth 2 (two) times daily.    [provider]  diclofenac (VOLTAREN) 50 MG EC tablet Take 1 tablet (50 mg total) by mouth 3 (three) times daily as needed (joint pain/ inflammation). Patient not taking: Reported on 07/23/2017 07/27/13   Benjamine Mola,  FNP  gabapentin (NEURONTIN) 300 MG capsule Take 1 capsule (300 mg total) by mouth 2 (two) times daily. For substance withdrawal syndrome Patient not taking: Reported on 09/28/2017 02/22/13   Lindell Spar I, NP  gabapentin (NEURONTIN) 400 MG capsule Take 400 mg by mouth 3 (three) times daily.    [provider]  hydrochlorothiazide (HYDRODIURIL) 25 MG tablet Take 25 mg by mouth daily.    [provider]  lisinopril (PRINIVIL,ZESTRIL) 10 MG tablet Take 1 tablet (10 mg total) by mouth daily. Patient not taking: Reported on 07/23/2017 07/27/13   Benjamine Mola, FNP  Methotrexate,  Anti-Rheumatic, (METHOTREXATE, PF, [place]) Inject 1 mL into the skin once a week.     [provider]  metoprolol tartrate (LOPRESSOR) 50 MG tablet Take 50 mg by mouth 2 (two) times daily. 07/14/17   [provider]  ondansetron (ZOFRAN) 4 MG tablet Take 1 tablet (4 mg total) by mouth every 6 (six) hours. Patient not taking: Reported on 07/23/2017 08/11/13   Harvie Heck, PA-C  oxyCODONE (OXY IR/ROXICODONE) 5 MG immediate release tablet Take 1 tablet (5 mg total) by mouth every 6 (six) hours as needed for moderate pain. 07/24/17   Saverio Danker, PA-C  ranitidine (ZANTAC) 150 MG tablet Take 150 mg by mouth 2 (two) times daily.    [provider]  risperiDONE (RISPERDAL) 2 MG tablet Take 1 tablet (2 mg total) by mouth at bedtime. For mood control 07/27/13   [place], Elyse Jarvis, FNP  sertraline (ZOLOFT) 100 MG tablet Take 100 mg by mouth at bedtime. 07/14/17   [provider]    Family History Family History  Problem Relation Age of Onset  . Heart attack Mother   . Atrial fibrillation Mother   . Skin cancer Father     Social History Social History   Tobacco Use  . Smoking status: Current Every Day Smoker    Packs/day: 1.00    Years: 25.00    Pack years: 25.00    Types: Cigarettes  . Smokeless tobacco: Former Systems developer    Quit date: 10/24/2012  Substance Use Topics  . Alcohol use: Yes    Comment: 1/5 Vodka; 1-40oz Daily   . Drug use: Yes    Types: "Crack" cocaine, Cocaine    Comment: last use 07/26/13     Allergies   Lisinopril   Review of Systems Review of Systems  Constitutional: Positive for chills. Negative for fever.  HENT: Negative for ear pain and sore throat.   Eyes: Negative for pain and visual disturbance.  Respiratory: Negative for cough and shortness of breath.   Cardiovascular: Negative for chest pain and palpitations.  Gastrointestinal: Positive for abdominal pain, diarrhea and nausea. Negative for vomiting.  Genitourinary: Negative for  dysuria and hematuria.  Musculoskeletal: Negative for arthralgias and back pain.  Skin: Negative for color change and rash.  Neurological: Negative for seizures and syncope.  All other systems reviewed and are negative.    Physical Exam Triage Vital Signs ED Triage Vitals  Enc Vitals Group     BP 09/28/17 1029 139/88     Pulse Rate 09/28/17 1029 73     Resp 09/28/17 1029 20     Temp 09/28/17 1029 98.8 F (37.1 C)     Temp Source 09/28/17 1029 Oral     SpO2 09/28/17 1029 97 %     Weight --      Height --      Head Circumference --  Peak Flow --      Pain Score 09/28/17 1030 7     Pain Loc --      Pain Edu? --      Excl. in [place]? --    No data found.  Updated Vital Signs BP 139/88 (BP Location: Right Arm)   Pulse 73   Temp 98.8 F (37.1 C) (Oral)   Resp 20   SpO2 97%       Physical Exam  All signs noted.  Abdominal exam reveals an obese abdomen.  Ports from recent surgery are well-healed.  Diffuse tenderness, worse in the right and left lower quadrants.  Guarding.  Prominent rebound tenderness. UC Treatments / Results  Labs (all labs ordered are listed, but only abnormal results are displayed) Labs Reviewed - No data to display  EKG None  Radiology Ct Abdomen Pelvis W Contrast  Result Date: 09/28/2017 CLINICAL DATA:  Abdominal pain mid to right lower quadrant with nausea 3 days. EXAM: CT ABDOMEN AND PELVIS WITH CONTRAST TECHNIQUE: Multidetector CT imaging of the abdomen and pelvis was performed using the standard protocol following bolus administration of intravenous contrast. CONTRAST:  12mL ISOVUE-300 IOPAMIDOL (ISOVUE-300) INJECTION 61% COMPARISON:  07/23/2017 and 02/25/2012 FINDINGS: Lower chest: Lung bases are within normal. Hepatobiliary: Liver, gallbladder and biliary tree are normal. Pancreas: Normal. Spleen: Normal. Adrenals/Urinary Tract: Adrenal glands are normal. Kidneys are normal in size without hydronephrosis or nephrolithiasis. Ureters and  bladder are normal. Stomach/Bowel: Stomach is normal. Evidence of patient's recent appendectomy. 1.3 cm oval nodular density over the right lower quadrant likely sequelae from previous appendicitis post appendectomy. Terminal ileum is normal. There is an acute inflammatory process over the lateral right mid abdomen involving a small bowel loop. There is a diverticula associated with this small bowel loop on the previous exam as this is compatible with acute diverticulitis. There is no evidence of perforation or diverticular abscess. Remainder of the colon is unremarkable. Vascular/Lymphatic: Mild calcified plaque over the distal abdominal aorta and iliac arteries. No adenopathy. Reproductive: Normal. Other: None. Musculoskeletal: Right hip arthroplasty causing moderate streak artifact over the pelvis. Mild avascular necrosis of the left femoral head. Minimal degenerative change of the spine. IMPRESSION: Acute inflammatory process involving a small bowel loop in the lateral right mid abdomen compatible with acute diverticulitis. No perforation or abscess. Previous appendectomy. 1.3 cm nodular density over the right lower quadrant likely sequelae from previous appendicitis post appendectomy. Aortic Atherosclerosis (ICD10-I70.0). Mild avascular necrosis left femoral head. Electronically Signed   By: Marin Olp M.D.   On: 09/28/2017 14:08    Procedures Procedures (including critical care time)  Medications Ordered in UC Medications - No data to display  Initial Impression / Assessment and Plan / UC Course  I have reviewed the triage vital signs and the nursing notes.  Pertinent labs & imaging results that were available during my care of the patient were reviewed by me and considered in my medical decision making (see chart for details).      Final Clinical Impressions(s) / UC Diagnoses   Final diagnoses:  None  Acute abdominal pain Discharge Instructions   None   Go to ER ED Prescriptions      None     Controlled Substance Prescriptions [place] Controlled Substance Registry consulted? Not Applicable   Raylene Everts, MD 09/28/17 2035

## 2017-09-28 NOTE — Discharge Instructions (Signed)
Sent into ER

## 2017-09-28 NOTE — ED Triage Notes (Signed)
Pt presents with abdominal pain in middle section and right lower quadrant

## 2017-09-28 NOTE — ED Provider Notes (Signed)
[facility] EMERGENCY DEPARTMENT Provider Note   CSN: [id number] Arrival date & time: 09/28/17  1103     History   Chief Complaint Chief Complaint  Patient presents with  . Abdominal Pain  . Diarrhea  . Nausea    HPI Blake Arnold is a 47 y.o. male.  He is complaining of abdominal pain for 3 and half days.  He said he feels nauseous but has not vomited.  The pain was he thought starting in his left chest but then was under the diaphragm and central and now is central and right lower quadrant.  He had his appendix out few months ago.  He has had some loose stools.  No fevers no chills no sick contacts no different foods no recent travel.  He went to urgent care today where they examined him and found to have rebound and transferred him here for further evaluation.  He said his urine feels a little hot but does not burn or does not have frequency.  The history is provided by the patient.  Abdominal Pain   This is a new problem. The current episode started more than 2 days ago. The problem occurs constantly. The problem has not changed since onset.The pain is associated with an unknown factor. The pain is located in the generalized abdominal region. The quality of the pain is aching. The pain is moderate. Associated symptoms include diarrhea and nausea. Pertinent negatives include anorexia, fever, melena, vomiting, constipation, dysuria, frequency, hematuria, headaches and arthralgias. Nothing aggravates the symptoms. Nothing relieves the symptoms. Past workup includes surgery (appy).  Diarrhea   Associated symptoms include abdominal pain. Pertinent negatives include no vomiting, no headaches and no arthralgias.    Past Medical History:  Diagnosis Date  . A-fib ([place])   . Acid reflux   . Alcohol abuse   . Anxiety   . AVN (avascular necrosis of bone) ([place])   . Chronic back pain   . Depression   . Hepatitis C    history of  . History of urinary frequency   .  Hypertension   . Peripheral neuropathy    hands and feet  . Polysubstance abuse ([place]) 01/25/2011  . Rheumatoid arteritis   . Seizures [facility])     Patient Active Problem List   Diagnosis Date Noted  . HTN (hypertension) 07/24/2017  . Seizure ([place]) 07/24/2017  . RA (rheumatoid arthritis) ([place]) 07/24/2017  . Appendicitis 07/23/2017  . Alcohol abuse with alcohol-induced mood disorder ([place]) 07/26/2013  . Bipolar disorder, unspecified ([place]) 10/28/2012  . Alcohol dependence ([place]) 04/02/2012  . Alcohol withdrawal ([place]) 04/02/2012  . Major depression 04/02/2012  . Legal problem 04/22/2011  . Polysubstance abuse ([place]) 01/25/2011  . Bronchiolitis 01/25/2011  . Alcohol abuse 01/22/2011  . Chest pain 01/22/2011  . TOBACCO ABUSE 02/09/2010  . SUBSTANCE ABUSE 02/09/2010  . FIBRILLATION, ATRIAL 06/08/2009    Past Surgical History:  Procedure Laterality Date  . CARDIOVERSION  03/07/2006  . LAPAROSCOPIC APPENDECTOMY N/A 07/23/2017   Procedure: APPENDECTOMY LAPAROSCOPIC;  Surgeon: Georganna Skeans, MD;  Location: [facility];  Service: General;  Laterality: N/A;        Home Medications    Prior to Admission medications   Medication Sig Start Date End Date Taking? Authorizing Provider  Adalimumab (HUMIRA) 40 MG/0.8ML PSKT Inject 0.8 mLs (40 mg total) into the skin once a week. tuesdays Patient taking differently: Inject 0.8 mLs into the skin once a week.  08/05/17 08/05/18 Yes Saverio Danker, PA-C  Albuterol Sulfate (PROAIR RESPICLICK) 829 (90 BASE) MCG/ACT AEPB Inhale 2 puffs into the lungs every 4 (four) hours as needed (wheezing; cough). 11/04/14  Yes Melony Overly, MD  amLODipine (NORVASC) 5 MG tablet Take 5 mg by mouth at bedtime. 07/14/17  Yes [provider]  benztropine (COGENTIN) 0.5 MG tablet Take 0.5 mg by mouth 2 (two) times daily.   Yes [provider]  gabapentin (NEURONTIN) 400 MG capsule Take 400 mg by mouth 3 (three) times daily.   Yes [provider]    hydrochlorothiazide (HYDRODIURIL) 25 MG tablet Take 25 mg by mouth daily.   Yes [provider]  Methotrexate, Anti-Rheumatic, (METHOTREXATE, PF, ) Inject 1 mL into the skin once a week.    Yes [provider]  metoprolol tartrate (LOPRESSOR) 50 MG tablet Take 50 mg by mouth 2 (two) times daily. 07/14/17  Yes [provider]  oxyCODONE (OXY IR/ROXICODONE) 5 MG immediate release tablet Take 1 tablet (5 mg total) by mouth every 6 (six) hours as needed for moderate pain. 07/24/17  Yes Saverio Danker, PA-C  ranitidine (ZANTAC) 150 MG tablet Take 150 mg by mouth 2 (two) times daily.   Yes [provider]  risperiDONE (RISPERDAL) 2 MG tablet Take 1 tablet (2 mg total) by mouth at bedtime. For mood control 07/27/13  Yes Withrow, Elyse Jarvis, FNP  sertraline (ZOLOFT) 100 MG tablet Take 100 mg by mouth at bedtime. 07/14/17  Yes [provider]  acamprosate (CAMPRAL) 333 MG tablet Take 2 tablets (666 mg total) by mouth 3 (three) times daily with meals. For alcohol dependence Patient not taking: Reported on 07/23/2017 07/27/13   Withrow, Elyse Jarvis, FNP  diclofenac (VOLTAREN) 50 MG EC tablet Take 1 tablet (50 mg total) by mouth 3 (three) times daily as needed (joint pain/ inflammation). Patient not taking: Reported on 07/23/2017 07/27/13   Benjamine Mola, FNP  gabapentin (NEURONTIN) 300 MG capsule Take 1 capsule (300 mg total) by mouth 2 (two) times daily. For substance withdrawal syndrome Patient not taking: Reported on 09/28/2017 02/22/13   Lindell Spar I, NP  lisinopril (PRINIVIL,ZESTRIL) 10 MG tablet Take 1 tablet (10 mg total) by mouth daily. Patient not taking: Reported on 07/23/2017 07/27/13   Withrow, Elyse Jarvis, FNP  ondansetron (ZOFRAN) 4 MG tablet Take 1 tablet (4 mg total) by mouth every 6 (six) hours. Patient not taking: Reported on 07/23/2017 08/11/13   Harvie Heck, PA-C    Family History Family History  Problem Relation Age of Onset  . Heart attack Mother   . Atrial  fibrillation Mother   . Skin cancer Father     Social History Social History   Tobacco Use  . Smoking status: Current Every Day Smoker    Packs/day: 1.00    Years: 25.00    Pack years: 25.00    Types: Cigarettes  . Smokeless tobacco: Former Systems developer    Quit date: 10/24/2012  Substance Use Topics  . Alcohol use: Yes    Comment: 1/5 Vodka; 1-40oz Daily   . Drug use: Yes    Types: "Crack" cocaine, Cocaine    Comment: last use 07/26/13     Allergies   Lisinopril   Review of Systems Review of Systems  Constitutional: Negative for fever.  HENT: Negative for sore throat.   Eyes: Negative for visual disturbance.  Respiratory: Negative for shortness of breath.   Cardiovascular: Negative for chest pain.  Gastrointestinal: Positive for abdominal pain, diarrhea and nausea. Negative for anorexia,  constipation, melena and vomiting.  Genitourinary: Negative for dysuria, frequency and hematuria.  Musculoskeletal: Negative for arthralgias.  Skin: Negative for rash.  Neurological: Negative for headaches.     Physical Exam Updated Vital Signs BP 136/88 (BP Location: Right Arm)   Pulse 73   Temp 98.1 F (36.7 C) (Oral)   Resp 18   Ht 5\' 8"  (1.727 m)   Wt 117 kg   SpO2 98%   BMI 39.23 kg/m   Physical Exam  Constitutional: He appears well-developed and well-nourished.  HENT:  Head: Normocephalic and atraumatic.  Eyes: Conjunctivae are normal.  Neck: Neck supple.  Cardiovascular: Normal rate and regular rhythm.  No murmur heard. Pulmonary/Chest: Effort normal and breath sounds normal. No respiratory distress.  Abdominal: Soft. There is generalized tenderness. There is no rigidity, no rebound and no guarding.  Musculoskeletal: He exhibits no edema, tenderness or deformity.  Neurological: He is alert.  Skin: Skin is warm and dry. Capillary refill takes less than 2 seconds.  Psychiatric: He has a normal mood and affect.  Nursing note and vitals reviewed.    ED Treatments /  Results  Labs (all labs ordered are listed, but only abnormal results are displayed) Labs Reviewed  COMPREHENSIVE METABOLIC PANEL - Abnormal; Notable for the following components:      Result Value   Glucose, Bld 104 (*)    BUN <5 (*)    All other components within normal limits  CBC - Abnormal; Notable for the following components:   WBC 12.4 (*)    MCV 103.1 (*)    All other components within normal limits  URINALYSIS, ROUTINE W REFLEX MICROSCOPIC - Abnormal; Notable for the following components:   Color, Urine STRAW (*)    Hgb urine dipstick SMALL (*)    All other components within normal limits  LIPASE, BLOOD    EKG None  Radiology Ct Abdomen Pelvis W Contrast  Result Date: 09/28/2017 CLINICAL DATA:  Abdominal pain mid to right lower quadrant with nausea 3 days. EXAM: CT ABDOMEN AND PELVIS WITH CONTRAST TECHNIQUE: Multidetector CT imaging of the abdomen and pelvis was performed using the standard protocol following bolus administration of intravenous contrast. CONTRAST:  190mL ISOVUE-300 IOPAMIDOL (ISOVUE-300) INJECTION 61% COMPARISON:  07/23/2017 and 02/25/2012 FINDINGS: Lower chest: Lung bases are within normal. Hepatobiliary: Liver, gallbladder and biliary tree are normal. Pancreas: Normal. Spleen: Normal. Adrenals/Urinary Tract: Adrenal glands are normal. Kidneys are normal in size without hydronephrosis or nephrolithiasis. Ureters and bladder are normal. Stomach/Bowel: Stomach is normal. Evidence of patient's recent appendectomy. 1.3 cm oval nodular density over the right lower quadrant likely sequelae from previous appendicitis post appendectomy. Terminal ileum is normal. There is an acute inflammatory process over the lateral right mid abdomen involving a small bowel loop. There is a diverticula associated with this small bowel loop on the previous exam as this is compatible with acute diverticulitis. There is no evidence of perforation or diverticular abscess. Remainder of the  colon is unremarkable. Vascular/Lymphatic: Mild calcified plaque over the distal abdominal aorta and iliac arteries. No adenopathy. Reproductive: Normal. Other: None. Musculoskeletal: Right hip arthroplasty causing moderate streak artifact over the pelvis. Mild avascular necrosis of the left femoral head. Minimal degenerative change of the spine. IMPRESSION: Acute inflammatory process involving a small bowel loop in the lateral right mid abdomen compatible with acute diverticulitis. No perforation or abscess. Previous appendectomy. 1.3 cm nodular density over the right lower quadrant likely sequelae from previous appendicitis post appendectomy. Aortic Atherosclerosis (ICD10-I70.0). Mild avascular  necrosis left femoral head. Electronically Signed   By: Marin Olp M.D.   On: 09/28/2017 14:08    Procedures Procedures (including critical care time)  Medications Ordered in ED Medications  iopamidol (ISOVUE-300) 61 % injection (has no administration in time range)  sodium chloride 0.9 % bolus 1,000 mL (1,000 mLs Intravenous New Bag/Given 09/28/17 1243)  morphine 4 MG/ML injection 4 mg (4 mg Intravenous Given 09/28/17 1240)  iopamidol (ISOVUE-300) 61 % injection 100 mL (100 mLs Intravenous Contrast Given 09/28/17 1318)  cefTRIAXone (ROCEPHIN) 1 g in sodium chloride 0.9 % 100 mL IVPB (1 g Intravenous New Bag/Given 09/28/17 1545)  metroNIDAZOLE (FLAGYL) IVPB 500 mg (500 mg Intravenous New Bag/Given 09/28/17 1547)     Initial Impression / Assessment and Plan / ED Course  I have reviewed the triage vital signs and the nursing notes.  Pertinent labs & imaging results that were available during my care of the patient were reviewed by me and considered in my medical decision making (see chart for details).  Clinical Course as of Sep 28 1724  Sun Sep 28, 6456  5967 47 year old male with recent appendicitis and appendectomy a few months ago here with 3 days of progressively worsening midepigastric abdominal  pain.  He is diffusely tender and slightly distended.  He is got a white count of 12.4.  Urinalysis unremarkable LFTs normal.  If put in for a CAT scan and ordered some IV fluids and some pain medicine.  Differential includes HEENT, infectious diarrhea, obstruction, abscess   [MB]  1510 Discussed with on-call attending from [place].  He felt the patient could go home if he is feeling well and is comfortable with the plan.  He agrees with current antibiotic choices.  States that the patient is having high fever or worsening pain or does not feel comfortable going home he could also understand admitting the patient for observation.  Patient states he would rather go home he feels well.   [MB]    Clinical Course User Index [MB] Hayden Rasmussen, MD     Final Clinical Impressions(s) / ED Diagnoses   Final diagnoses:  Diverticulitis of small intestine without perforation or abscess without bleeding    ED Discharge Orders         Ordered    amoxicillin-clavulanate (AUGMENTIN) 875-125 MG tablet  Every 12 hours     09/28/17 1513           Hayden Rasmussen, MD 09/28/17 1727

## 2017-09-28 NOTE — Discharge Instructions (Signed)
You were evaluated in the emergency room for 3 days of abdominal pain.  You had a CAT scan that showed that you have small bowel diverticulitis.  We have given you IV antibiotics and are sending you home with oral antibiotics.  You should hold on your immune suppressant medication until you talk with your rheumatologist.  Clear liquid diet advance as tolerated.  If you experience a high fever or worsening pain he should re-present to the emergency department for further evaluation.  Follow-up with your doctor.

## 2017-09-29 ENCOUNTER — Encounter: Payer: Self-pay | Admitting: Gastroenterology

## 2017-10-02 NOTE — Unmapped (Signed)
Patient reports he is currently off of methotrexate and Humira due to recent hospitalization for diverticulitis (?). He has about 4 days left of 7 day treatment with Amoxicillin, and then he will resume maintenance meds. He still has 2 full vials of mtx (~1 month), so will move call at this time. Joseph Sandoval had no other questions or concerns.    Joseph Sandoval A. Katrinka Blazing, PharmD, BCPS - Pharmacist   [facility]   [address], [place], [place] [address]   t [phone] - f [phone]

## 2017-10-24 NOTE — Unmapped (Signed)
[facility] Refill Coordination Note  Specialty Medication(s): methotrexate 25mg /ml  Additional Medications shipped: syringes, albuterol    [name], DOB: 06/28/1970  Phone: [phone] (home) [phone] (work), Alternate phone contact: N/A  Phone or address changes today?: No  All above HIPAA information was verified with patient.  Shipping Address: [address]  [place] [address]   Insurance changes? No    Completed refill call assessment today to schedule patient's medication shipment from the [facility] [phone]).      Confirmed the medication and dosage are correct and have not changed: Yes, regimen is correct and unchanged.    Confirmed patient started or stopped the following medications in the past month:  No, there are no changes reported at this time.    Are you tolerating your medication?:  [name] reports tolerating the medication.    ADHERENCE    Did you miss any doses in the past 4 weeks? No missed doses reported.    FINANCIAL/SHIPPING    Delivery Scheduled: Yes, Expected medication delivery date: 10/28/17     The patient will receive a drug information handout for each medication shipped and additional FDA Medication Guides as required.      [name] did not have any additional questions at this time.    Delivery address validated in Epic.    We will follow up with patient monthly for standard refill processing and delivery.      Thank you,  Lupita Shutter   [facility] Specialty Pharmacist

## 2017-10-27 MED FILL — METHOTREXATE SODIUM 25 MG/ML INJECTION SOLUTION: 14 days supply | Qty: 2 | Fill #0 | Status: AC

## 2017-10-27 MED FILL — ALBUTEROL SULFATE HFA 90 MCG/ACTUATION AEROSOL INHALER: 34 days supply | Qty: 36 | Fill #0 | Status: AC

## 2017-10-27 MED FILL — BD TUBERCULIN SYRINGE 1 ML 25 GAUGE X 5/8": 84 days supply | Qty: 12 | Fill #0 | Status: AC

## 2017-10-27 MED FILL — BD TUBERCULIN SYRINGE 1 ML 25 GAUGE X 5/8": 84 days supply | Qty: 12 | Fill #0

## 2017-10-27 MED FILL — METHOTREXATE SODIUM 25 MG/ML INJECTION SOLUTION: 14 days supply | Qty: 2 | Fill #0

## 2017-10-27 MED FILL — ALBUTEROL SULFATE HFA 90 MCG/ACTUATION AEROSOL INHALER: 34 days supply | Qty: 36 | Fill #0

## 2017-11-03 MED ORDER — ADALIMUMAB 40 MG/0.8 ML SUBCUTANEOUS SYRINGE KIT
SUBCUTANEOUS | 3 refills | 0 days | Status: CP
Start: 2017-11-03 — End: 2018-02-18

## 2017-11-04 NOTE — Unmapped (Signed)
Joseph Sandoval is doing well at this time  He's got folic acid and syringes on hand for the methotrexate  Sending methotrexate vials for delivery on Friday    [facility] Refill Coordination Note    Specialty Medication(s) to be Shipped:   Inflammatory Disorders: methotrexate vials    Other medication(s) to be shipped: n/a     Joseph Sandoval, DOB: [birth year]  Phone: [phone] (home) [phone] (work)  Shipping Address: [address]  [place] [address]    All above HIPAA information was verified with patient.     Completed refill call assessment today to schedule patient's medication shipment from the [facility] [phone]).       Specialty medication(s) and dose(s) confirmed: Regimen is correct and unchanged.   Changes to medications: Joseph Sandoval reports no changes reported at this time.  Changes to insurance: No  Questions for the pharmacist: No    The patient will receive a drug information handout for each medication shipped and additional FDA Medication Guides as required.      DISEASE/MEDICATION-SPECIFIC INFORMATION        For Rheumatology patients: Next dose of methotrexate from this shipment due on 10/2    ADHERENCE     Medication Adherence    Adherence tools used:  calendar        No missed doses reported    SHIPPING     Shipping address confirmed in Epic.     Delivery Scheduled: Yes, Expected medication delivery date: 9/27 via UPS or courier.     Renette Butters   [facility] Specialty Technician

## 2017-11-09 ENCOUNTER — Encounter (HOSPITAL_COMMUNITY): Payer: Self-pay | Admitting: *Deleted

## 2017-11-09 ENCOUNTER — Ambulatory Visit (HOSPITAL_COMMUNITY)
Admission: EM | Admit: 2017-11-09 | Discharge: 2017-11-09 | Disposition: A | Discharge status: Home / Self Care | Payer: Self-pay

## 2017-11-09 DIAGNOSIS — M069 Rheumatoid arthritis, unspecified: Secondary | ICD-10-CM

## 2017-11-09 DIAGNOSIS — Z9889 Other specified postprocedural states: Secondary | ICD-10-CM

## 2017-11-09 DIAGNOSIS — M5412 Radiculopathy, cervical region: Secondary | ICD-10-CM

## 2017-11-09 DIAGNOSIS — M79602 Pain in left arm: Secondary | ICD-10-CM

## 2017-11-09 DIAGNOSIS — M542 Cervicalgia: Secondary | ICD-10-CM

## 2017-11-09 MED ORDER — PREDNISONE 20 MG PO TABS: ORAL_TABLET | ORAL | 0 refills | Status: DC

## 2017-11-09 NOTE — ED Provider Notes (Signed)
MRN: [id number] DOB: 13-May-1970  Subjective:   Blake Arnold is a 47 y.o. male presenting for 4-day history of persistent and constant worsening neck pain that radiates down to left shoulder and arm with associated numbness and tingling.  Patient has a significant history of RA, neuropathy and just had surgery of her cervical region of his neck.  Reports that his symptoms started from doing some heavy lifting, lost his footing and ended up catching his fall by patient himself on the left side.  He also has a history of atrial fibrillation, hypertension and seizures.  He is unable to take NSAIDs but is being managed for his RA and taking baclofen as a muscle relaxant.  reports that he has been smoking cigarettes. He has a 25.00 pack-year smoking history. He quit smokeless tobacco use about 5 years ago. He reports that he drank alcohol. He reports that he has current or past drug history. Drugs: "Crack" cocaine and Cocaine.   No current facility-administered medications for this encounter.   Current Outpatient Medications:  .  Adalimumab (HUMIRA) 40 MG/0.8ML PSKT, Inject 0.8 mLs (40 mg total) into the skin once a week. tuesdays (Patient taking differently: Inject 0.8 mLs into the skin once a week. ), Disp: 3.2 mL, Rfl: 11 .  amLODipine (NORVASC) 5 MG tablet, Take 5 mg by mouth at bedtime., Disp: , Rfl: 4 .  benztropine (COGENTIN) 0.5 MG tablet, Take 0.5 mg by mouth 2 (two) times daily., Disp: , Rfl:  .  FOLIC ACID PO, Take by mouth., Disp: , Rfl:  .  gabapentin (NEURONTIN) 400 MG capsule, Take 400 mg by mouth 3 (three) times daily., Disp: , Rfl:  .  hydrochlorothiazide (HYDRODIURIL) 25 MG tablet, Take 25 mg by mouth daily., Disp: , Rfl:  .  Methotrexate, Anti-Rheumatic, (METHOTREXATE, PF, [place]), Inject 1 mL into the skin once a week. , Disp: , Rfl:  .  metoprolol tartrate (LOPRESSOR) 50 MG tablet, Take 50 mg by mouth 2 (two) times daily., Disp: , Rfl: 4 .  ranitidine (ZANTAC) 150 MG tablet, Take 150  mg by mouth 2 (two) times daily., Disp: , Rfl:  .  risperiDONE (RISPERDAL) 2 MG tablet, Take 1 tablet (2 mg total) by mouth at bedtime. For mood control, Disp: 14 tablet, Rfl: 0 .  sertraline (ZOLOFT) 100 MG tablet, Take 100 mg by mouth at bedtime., Disp: , Rfl: 0 .  acamprosate (CAMPRAL) 333 MG tablet, Take 2 tablets (666 mg total) by mouth 3 (three) times daily with meals. For alcohol dependence (Patient not taking: Reported on 07/23/2017), Disp: 90 tablet, Rfl: 0 .  Albuterol Sulfate (PROAIR RESPICLICK) 382 (90 BASE) MCG/ACT AEPB, Inhale 2 puffs into the lungs every 4 (four) hours as needed (wheezing; cough)., Disp: 1 each, Rfl: 0 .  amoxicillin-clavulanate (AUGMENTIN) 875-125 MG tablet, Take 1 tablet by mouth every 12 (twelve) hours., Disp: 14 tablet, Rfl: 0   Allergies  Allergen Reactions  . Lisinopril Swelling    Past Medical History:  Diagnosis Date  . A-fib ([place])   . Acid reflux   . Alcohol abuse   . Anxiety   . Asthma   . AVN (avascular necrosis of bone) ([place])   . Chronic back pain   . Depression   . Hepatitis C    history of  . History of urinary frequency   . Hypertension   . Peripheral neuropathy    hands and feet  . Polysubstance abuse ([place]) 01/25/2011  . Rheumatoid arteritis   .  Rheumatoid arteritis   . Seizures ([place])      Past Surgical History:  Procedure Laterality Date  . APPENDECTOMY    . CARDIOVERSION  03/07/2006  . CARPAL TUNNEL RELEASE    . CERVICAL FUSION    . LAPAROSCOPIC APPENDECTOMY N/A 07/23/2017   Procedure: APPENDECTOMY LAPAROSCOPIC;  Surgeon: Georganna Skeans, MD;  Location: [facility];  Service: General;  Laterality: N/A;    Objective:   Vitals: BP 135/89   Pulse 84   Temp 97.8 F (36.6 C) (Oral)   Resp 18   SpO2 97%   Physical Exam  Constitutional: He is oriented to person, place, and time. He appears well-developed and well-nourished.  Cardiovascular: Normal rate.  Pulmonary/Chest: Effort normal.  Musculoskeletal:       Cervical back:  He exhibits decreased range of motion (in all directions, worse with extension) and tenderness (lower base of neck). He exhibits no bony tenderness, no swelling, no edema, no deformity and no spasm.  Neurological: He is alert and oriented to person, place, and time.   Assessment and Plan :   Cervical radiculopathy  Neck pain  Left arm pain  H/O neck surgery  Rheumatoid arthritis, involving unspecified site, unspecified rheumatoid factor presence ([place])  Patient is not a good candidate to take NSAIDs, will use a steroid course to manage his symptoms.  Patient is to maintain adequate hydration, use muscle relaxant and check back with his neurosurgeon.  ER return to clinic precautions reviewed.   Jaynee Eagles, PA-C 11/09/17 1246

## 2017-11-09 NOTE — ED Triage Notes (Signed)
Reports slipping and catching himself from falling down 4 days ago while heavy lifting.  C/O neck pain radiating down into left shoulder and arm.  Denies hitting head or neck.

## 2017-11-09 NOTE — Discharge Instructions (Addendum)
Hydrate well with at least 2 liters (1 gallon) of water daily. Maintain your muscle relaxant and contact your neurosurgeon.

## 2017-11-10 MED FILL — METHOTREXATE SODIUM 25 MG/ML INJECTION SOLUTION: 28 days supply | Qty: 4 | Fill #1

## 2017-11-10 MED FILL — METHOTREXATE SODIUM 25 MG/ML INJECTION SOLUTION: 28 days supply | Qty: 4 | Fill #1 | Status: AC

## 2017-11-14 ENCOUNTER — Encounter: Payer: Self-pay | Admitting: Gastroenterology

## 2017-11-14 ENCOUNTER — Ambulatory Visit (INDEPENDENT_AMBULATORY_CARE_PROVIDER_SITE_OTHER): Payer: Self-pay | Admitting: Gastroenterology

## 2017-11-14 VITALS — BP 126/88 | HR 76 | Ht 68.0 in | Wt 247.0 lb

## 2017-11-14 DIAGNOSIS — R933 Abnormal findings on diagnostic imaging of other parts of digestive tract: Secondary | ICD-10-CM

## 2017-11-14 NOTE — Patient Instructions (Addendum)
Please return to see Dr. Ardis Hughs in 3 months. Call sooner if any serious abd pains, GI issues.  Normal BMI (Body Mass Index- based on height and weight) is between 19 and 25. Your BMI today is Body mass index is 37.56 kg/m. [name] Please consider follow up  regarding your BMI with your Primary Care Provider.  Thank you for entrusting me with your care and choosing [facility].  Dr Ardis Hughs

## 2017-11-14 NOTE — Progress Notes (Signed)
HPI: This is a very pleasant 47 year old man who was referred to me by Elbert Ewings, FNP  to evaluate recent diverticulitis.    Chief complaint is recent diverticulitis  He was in the emergency room with abdominal pain August 2019.  White blood cell count 12,000 (seems to have chronically elevated white blood cell counts on my epic review).  CT scan as above.  He was felt to have small bowel diverticulitis and was put on Augmentin orally.  He did not need hospital admission.  He completed the abx.  And his abdominal pains significantly improved.  He does have chronic intermittent loose stools.  Never rectal bleeding.  He has rheumatoid arthritis and has been on methotrexate for 4 years, Humira weekly for the past year or so.   Old Data Reviewed:  He had acute appendicitis June 2019 and underwent an uncomplicated laparoscopic appendectomy, Dr. Georganna Skeans.  CT scan abdomen pelvis with IV and oral contrast August 2019: Acute inflammatory process involving a small bowel loop in the lateral right mid abdomen compatible with acute diverticulitis. No perforation or abscess. Previous appendectomy. 1.3 cm nodular density over the right lower quadrant likely sequelae from previous appendicitis post appendectomy. aortic Atherosclerosis (ICD10-I70.0). Mild avascular necrosis left femoral head.     Review of systems: Pertinent positive and negative review of systems were noted in the above HPI section. All other review negative.   Past Medical History:  Diagnosis Date  . A-fib ([place])   . Acid reflux   . Alcohol abuse   . Anxiety   . Asthma   . AVN (avascular necrosis of bone) ([place])   . Chronic back pain   . Depression   . Hepatitis C    history of  . History of urinary frequency   . Hypertension   . Peripheral neuropathy    hands and feet  . Polysubstance abuse ([place]) 01/25/2011  . 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[place])   . 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[place])   . Seizures ([place])     Past  Surgical History:  Procedure Laterality Date  . APPENDECTOMY    . CARDIOVERSION  03/07/2006  . CARPAL TUNNEL RELEASE    . CERVICAL FUSION    . LAPAROSCOPIC APPENDECTOMY N/A 07/23/2017   Procedure: APPENDECTOMY LAPAROSCOPIC;  Surgeon: Georganna Skeans, MD;  Location: [facility];  Service: General;  Laterality: N/A;    Current Outpatient Medications  Medication Sig Dispense Refill  . Adalimumab (HUMIRA) 40 MG/0.8ML PSKT Inject 0.8 mLs (40 mg total) into the skin once a week. tuesdays (Patient taking differently: Inject 0.8 mLs into the skin once a week. ) 3.2 mL 11  . Albuterol Sulfate (PROAIR RESPICLICK) 195 (90 BASE) MCG/ACT AEPB Inhale 2 puffs into the lungs every 4 (four) hours as needed (wheezing; cough). 1 each 0  . amLODipine (NORVASC) 5 MG tablet Take 5 mg by mouth at bedtime.  4  . benztropine (COGENTIN) 0.5 MG tablet Take 0.5 mg by mouth 2 (two) times daily.    [name] FOLIC ACID PO Take by mouth.    . gabapentin (NEURONTIN) 400 MG capsule Take 400 mg by mouth 3 (three) times daily.    . hydrochlorothiazide (HYDRODIURIL) 25 MG tablet Take 25 mg by mouth daily.    . Methotrexate, Anti-Rheumatic, (METHOTREXATE, PF, [place]) Inject 1 mL into the skin once a week.     . metoprolol tartrate (LOPRESSOR) 50 MG tablet Take 50 mg by mouth 2 (two) times daily.  4  . predniSONE (DELTASONE) 20  MG tablet Day 1-5: Take 2 tablets daily. Day 6-10: Take 1 tablets daily. Take tablets with breakfast. 15 tablet 0  . ranitidine (ZANTAC) 150 MG tablet Take 150 mg by mouth 2 (two) times daily.    . risperiDONE (RISPERDAL) 2 MG tablet Take 1 tablet (2 mg total) by mouth at bedtime. For mood control 14 tablet 0  . sertraline (ZOLOFT) 100 MG tablet Take 100 mg by mouth at bedtime.  0   No current facility-administered medications for this visit.     Allergies as of 11/14/2017 - Review Complete 11/14/2017  Allergen Reaction Noted  . Lisinopril Swelling 09/13/2015    Family History  Problem Relation Age of Onset  .  Heart attack Mother   . Atrial fibrillation Mother   . Skin cancer Father     Social History   Socioeconomic History  . Marital status: Single    Spouse name: Not on file  . Number of children: Not on file  . Years of education: Not on file  . Highest education level: Not on file  Occupational History  . Not on file  Social Needs  . Financial resource strain: Not on file  . Food insecurity:    Worry: Not on file    Inability: Not on file  . Transportation needs:    Medical: Not on file    Non-medical: Not on file  Tobacco Use  . Smoking status: Current Every Day Smoker    Packs/day: 1.00    Years: 25.00    Pack years: 25.00    Types: Cigarettes  . Smokeless tobacco: Former Systems developer    Quit date: 10/24/2012  Substance and Sexual Activity  . Alcohol use: Not Currently  . Drug use: Not Currently    Types: "Crack" cocaine, Cocaine    Comment: last use 07/26/13  . Sexual activity: Never  Lifestyle  . Physical activity:    Days per week: Not on file    Minutes per session: Not on file  . Stress: Not on file  Relationships  . Social connections:    Talks on phone: Not on file    Gets together: Not on file    Attends religious service: Not on file    Active member of club or organization: Not on file    Attends meetings of clubs or organizations: Not on file    Relationship status: Not on file  . Intimate partner violence:    Fear of current or ex partner: Not on file    Emotionally abused: Not on file    Physically abused: Not on file    Forced sexual activity: Not on file  Other Topics Concern  . Not on file  Social History Narrative  . Not on file     Physical Exam: BP 126/88   Pulse 76   Ht 5\' 8"  (1.727 m)   Wt 247 lb (112 kg)   BMI 37.56 kg/m  Constitutional: generally well-appearing Psychiatric: alert and oriented x3 Eyes: extraocular movements intact Mouth: oral pharynx moist, no lesions Neck: supple no lymphadenopathy Cardiovascular: heart regular  rate and rhythm Lungs: clear to auscultation bilaterally Abdomen: soft, nontender, nondistended, no obvious ascites, no peritoneal signs, normal bowel sounds Extremities: no lower extremity edema bilaterally Skin: no lesions on visible extremities   Assessment and plan: 47 y.o. male with recent small bowel diverticulitis, immunosuppressed chronically  I explained to him that it is pretty unusual to have diverticulitis involving the small intestine but that may indeed  have been what occurred here.  He certainly has responded to oral antibiotics quite well.  He does not have any lingering pains in his right upper quadrant.  I would like to see him back in follow-up in 3 months and if he continues to do well without recurrence of significant abdominal pains or changes in his bowel habits then I do not think he would require any work-up further work-up.  He knows to call here sooner if he has any issues prior to then.   Please see the "Patient Instructions" section for addition details about the plan.   Owens Loffler, MD [facility] 11/14/2017, 9:44 AM  Cc: Elbert Ewings, FNP

## 2017-11-20 NOTE — Unmapped (Signed)
Patient is doing well at this time  He got a shipment of methotrexate on 9/30 and has plenty on hand for now  Nothing else needed at this time either    Rescheduling refill call    Everlean Cherry CPHT

## 2017-12-04 MED FILL — ALBUTEROL SULFATE HFA 90 MCG/ACTUATION AEROSOL INHALER: 34 days supply | Qty: 36 | Fill #1 | Status: AC

## 2017-12-04 MED FILL — ALBUTEROL SULFATE HFA 90 MCG/ACTUATION AEROSOL INHALER: 34 days supply | Qty: 36 | Fill #1

## 2017-12-04 NOTE — Unmapped (Signed)
Patient has 2 vials of methotrexate on hand at this time [ says this will last him a month ]  Rescheduling refill call for 2 weeks from now    [facility] CPHT

## 2017-12-19 NOTE — Unmapped (Signed)
[facility] Refill Coordination Note  Specialty Medication(s): METHOTREXATE 25MG /ML  DULERA 200-5    Joseph Sandoval, DOB: [birth year]  Phone: [phone] (home) [phone] (work), Alternate phone contact: N/A  Phone or address changes today?: No  All above HIPAA information was verified with patient.  Shipping Address: [address]  [place] [address]   Insurance changes? No    Completed refill call assessment today to schedule patient's medication shipment from the [facility] [phone]).      Confirmed the medication and dosage are correct and have not changed: Yes, regimen is correct and unchanged.    Confirmed patient started or stopped the following medications in the past month:  No, there are no changes reported at this time.    Are you tolerating your medication?:  Joseph Sandoval reports tolerating the medication.    ADHERENCE    (Below is required for Medicare Part B or Transplant patients only - per drug):   How many tablets were dispensed last month: 4  Patient currently has 1 remaining.    Did you miss any doses in the past 4 weeks? No missed doses reported.    FINANCIAL/SHIPPING    Delivery Scheduled: Yes, Expected medication delivery date: 11/13, TO BE USED ON 11/16     Medication will be delivered via UPS to the home address in [facility].    The patient will receive a drug information handout for each medication shipped and additional FDA Medication Guides as required.      Joseph Sandoval did not have any additional questions at this time.    We will follow up with patient monthly for standard refill processing and delivery.      Thank you,  Westley Gambles   [facility] Specialty Technician

## 2017-12-23 MED FILL — METHOTREXATE SODIUM 25 MG/ML INJECTION SOLUTION: 28 days supply | Qty: 4 | Fill #2

## 2017-12-23 MED FILL — DULERA 200 MCG-5 MCG/ACTUATION HFA AEROSOL INHALER: 90 days supply | Qty: 39 | Fill #0

## 2017-12-23 MED FILL — DULERA 200 MCG-5 MCG/ACTUATION HFA AEROSOL INHALER: 90 days supply | Qty: 39 | Fill #0 | Status: AC

## 2017-12-23 MED FILL — METHOTREXATE SODIUM 25 MG/ML INJECTION SOLUTION: 28 days supply | Qty: 4 | Fill #2 | Status: AC

## 2018-01-05 ENCOUNTER — Encounter: Admit: 2018-01-05 | Discharge: 2018-01-06 | Payer: MEDICARE | Attending: Rheumatology | Primary: Rheumatology

## 2018-01-05 DIAGNOSIS — M0579 Rheumatoid arthritis with rheumatoid factor of multiple sites without organ or systems involvement: Principal | ICD-10-CM

## 2018-01-05 LAB — CBC W/ AUTO DIFF
BASOPHILS ABSOLUTE COUNT: 0.1 10*9/L (ref 0.0–0.1)
BASOPHILS RELATIVE PERCENT: 0.6 %
EOSINOPHILS ABSOLUTE COUNT: 0.2 10*9/L (ref 0.0–0.4)
EOSINOPHILS RELATIVE PERCENT: 2.7 %
HEMATOCRIT: 51.9 % (ref 41.0–53.0)
LARGE UNSTAINED CELLS: 3 % (ref 0–4)
LYMPHOCYTES ABSOLUTE COUNT: 3.1 10*9/L (ref 1.5–5.0)
LYMPHOCYTES RELATIVE PERCENT: 34.6 %
MEAN CORPUSCULAR HEMOGLOBIN CONC: 33 g/dL (ref 31.0–37.0)
MEAN CORPUSCULAR HEMOGLOBIN: 33.1 pg (ref 26.0–34.0)
MEAN CORPUSCULAR VOLUME: 100.2 fL — ABNORMAL HIGH (ref 80.0–100.0)
MEAN PLATELET VOLUME: 7.6 fL (ref 7.0–10.0)
MONOCYTES RELATIVE PERCENT: 5.9 %
NEUTROPHILS ABSOLUTE COUNT: 4.7 10*9/L (ref 2.0–7.5)
NEUTROPHILS RELATIVE PERCENT: 52.7 %
PLATELET COUNT: 213 10*9/L (ref 150–440)
RED BLOOD CELL COUNT: 5.18 10*12/L (ref 4.50–5.90)
RED CELL DISTRIBUTION WIDTH: 14.2 % (ref 12.0–15.0)

## 2018-01-05 LAB — CREATININE
CREATININE: 0.78 mg/dL (ref 0.70–1.30)
EGFR CKD-EPI NON-AA MALE: >90 mL/min/{1.73_m2} (ref >=60)

## 2018-01-05 LAB — ALT (SGPT): Alanine aminotransferase:CCnc:Pt:Ser/Plas:Qn:: 39

## 2018-01-05 LAB — MEAN CORPUSCULAR HEMOGLOBIN: Lab: 33.1

## 2018-01-05 LAB — EGFR CKD-EPI NON-AA MALE: Lab: >90

## 2018-01-05 LAB — AST (SGOT): Aspartate aminotransferase:CCnc:Pt:Ser/Plas:Qn:: 25

## 2018-01-05 LAB — ALT: ALT (SGPT): 39 U/L (ref <50)

## 2018-01-05 LAB — BLOOD UREA NITROGEN: Urea nitrogen:MCnc:Pt:Ser/Plas:Qn:: 9

## 2018-01-05 MED ORDER — PREDNISONE 5 MG TABLET
ORAL_TABLET | 0 refills | 0 days | Status: CP
Start: 2018-01-05 — End: 2018-04-13

## 2018-01-05 NOTE — Unmapped (Signed)
Assessment/Recommendations:   Joseph Sandoval is a 47 y.o. Caucasian male with history of prior polysubstance abuse, Hep C s/p Harvoni (fibroscan F1), HTN and neuropathy who presents for follow up of seropositive (+RF, +anti-CCP) rheumatoid arthritis.     1. Seropositive nonerosive RA: Patient today still continues to have active synovitis on exam.  He also presents with reduced grip strength.  In regards to his management he had been increased on Humira to weekly however due recent events (appendicitis, diverticulitis, and falls)  he has missed several doses of both methotrexate and Humira.  Hence at this time I cannot say that he has failed either of the regimen and asked patient to sincerely continue to take Humira and methotrexate weekly.  We will reevaluate him in 3 months and note if there is a difference in his symptoms and exam.  If not, we can discuss alternate regimen as well as consider ultrasound clinic for further guidance and therapy since straight from April did not show any worsening or erosions..    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 Immunizations   - UTD with pneumonia vaccines   - will give flu vaccine today     RTC in 4 months or sooner prn.     Patient was discussed with attending physician, Dr. Sullivan Lone    HPI:  Joseph Sandoval is a 47 y.o. Caucasian male with history of prior polysubstance abuse, Hep C s/p Harvoni, HTN and neuropathy who presents for follow up of seropositive (+RF, +anti-CCP) rheumatoid arthritis.        Interm History:  Patient was last seen in April 2019.  Since then he has been on Humira weekly and methotrexate subcu weekly.  Patient stated that since his last visit he was diagnosed with appendicitis had to undergo surgery and then had diverticulitis and also fell and started having pain in his C-spine.  He had been on and off his Humira during this time due to surgeries.  Today he still is complaining of pain in his bilateral hands as well as his feet.  Notes pain in his knees as well as his hips and neck.  He notes significant morning stiffness over an hour and difficulty making a grip with his fingers    Other wise, denies any rashes or lesions.        Disease History:    He was initially referred 05/2015 after hepatology checked RF which +.  He had synovitis of MCPs, PIPs, wrists at the time and anti-CCP low positive.  He has hx of chronic back pain and opioid dependence.  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 He was started on MTX after clarifying with hepatology (fibroscan F1 only).  He was seen for follow up 08/2015 and transitioned from po to sq methotrexate now on 25 mg weekly.  He tolerates well without GI side effects.  At last visit 11/2015, he had only partial improvement so discussed adding TNF-I but this was deferred given upcoming THA.  He also underwent carpal tunnel release on the left 03/2016 due to paresthesias without improvement.   He had an MRI spine and referred  to ortho spine due to concern for cervical radiculopathy as cause of his symptoms/paresthesias.    He was off off  MTX for about 1 month since he started getting nodules on his back and stopped. During his last visit in 6/18 pt was restarted on MTX 25 mg and also HCQ was added.   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 Alcoholism (CMS-HCC)    ??? Anxiety    ??? Arthritis    ??? Asthma    ??? Atrial fibrillation (CMS-HCC)    ??? Bipolar affective (CMS-HCC)    ??? Bronchitis    ??? Depression    ??? Headache    ??? Hepatitis C    ??? Hyperlipidemia    ??? Hypertension    ??? Infectious viral hepatitis    ??? Joint pain    ??? Lung disease    ??? Polysubstance abuse (CMS-HCC)    ??? RA (rheumatoid arthritis) (CMS-HCC)    ??? Seizures (CMS-HCC)    ??? Shoulder injury    ??? Sleep apnea, obstructive    ??? Sleeping difficulties      Allergies:  Lisinopril    Medications:     Current outpatient prescriptions:   ???  amLODIPine (NORVASC) 5 MG tablet, Take 5 mg by mouth daily., Disp: , Rfl:   ???  aspirin (ECOTRIN) 81 MG tablet, Take 81 mg by mouth daily., Disp: , Rfl:   ???  baclofen (LIORESAL) 10 MG tablet, Take 10 mg by mouth Three (3) times a day. As needed, Disp: , Rfl:   ???  benztropine (COGENTIN) 0.5 MG tablet, Take 0.5 mg by mouth Two (2) times a day., Disp: , Rfl:   ???  carBAMazepine (TEGRETOL) 200 mg tablet, Take 200 mg by mouth Two (2) times a day., Disp: , Rfl:   ???  celecoxib (CELEBREX) 100 MG capsule, Take 100 mg by mouth Two (2) times a day. , Disp: , Rfl:   ???  diltiazem (CARDIZEM CD) 120 MG 24 hr capsule, Take 1 capsule (120 mg total) by mouth daily., Disp: 30 capsule, Rfl: 6  ???  gabapentin (NEURONTIN) 400 MG capsule, Take 400 mg by mouth Three (3) times a day., Disp: , Rfl:   ???  loratadine (CLARITIN) 10 mg tablet, Take 10 mg by mouth daily., Disp: , Rfl:   ???  metoprolol tartrate (LOPRESSOR) 25 MG tablet, Take 25 mg by mouth Two (2) times a day., Disp: , Rfl:   ???  naltrexone (DEPADE) 50 mg tablet, Take 50 mg by mouth nightly., Disp: , Rfl:   ???  ranitidine (ZANTAC) 150 MG tablet, Take 150 mg by mouth Two (2) times a day., Disp: , Rfl:   ???  risperiDONE (RISPERDAL) 2 MG tablet, Take 2 mg by mouth nightly., Disp: , Rfl:   ???  simvastatin (ZOCOR) 20 MG tablet, Take 20 mg by mouth nightly., Disp: , Rfl:   ???  [DISCONTINUED] hydrochlorothiazide (HYDRODIURIL) 25 MG tablet, Take 25 mg by mouth daily., Disp: , Rfl:   ???  albuterol sulfate (PROAIR RESPICLICK) 90 mcg/actuation AePB, Inhale., Disp: , Rfl:   ???  glucosamine HCl 500 mg Tab, Take 1 tablet by mouth Two (2) times a day., Disp: , Rfl:     Surgical History:  Past Surgical History:   Procedure Laterality Date ??? CARDIAC SURGERY  2008 shock therapy   ??? PR ALLOGRAFT FOR SPINE SURGERY ONLY MORSELIZED N/A 03/31/2017    Procedure: Allograft For Spine Surgery Only; Morselized;  Surgeon: Nemiah Commander, MD;  Location: [facility] OR [facility];  Service: Ortho Spine   ??? PR ANTERIOR INSTRUMENTATION 2-3 VERTEBRAL SEGMENTS N/A 03/31/2017    Procedure: Ant Instrum; 2 To 3 Verteb Segmt Cervical;  Surgeon: Nemiah Commander, MD;  [facility]: [facility] OR [facility];  Service: Ortho Spine   ??? PR ARTHRODESIS ANT INTERBODY INC DISCECTOMY, CERVICAL BELOW C2 N/A 03/31/2017    Procedure: Arthrodes, Ant Intrbdy, Incl Disc Spc Prep, Discect, Osteophyt/Decompress Spinl Crd &/Or Nrv Rt, Crv Blo C2;  Surgeon: Nemiah Commander, MD;  [facility]: [facility] OR [facility];  Service: Ortho Spine   ??? PR ARTHRODESIS ANT INTERBODY INC DISCECTOMY, CERVICAL BELOW C2 EACH ADDL N/A 03/31/2017    Procedure: [place], Ant Intbdy, Incl Disc Spc Prep/Disctmy/Ostephyt/Decmpr Spnl Crd/Nrv Rt; Cerv Belo C2, Ea Add`L Spc;  Surgeon: Nemiah Commander, MD;  Location: [facility];  Service: Ortho Spine   ??? PR AUTOGRAFT SPINE SURGERY LOCAL FROM SAME INCISION N/A 03/31/2017    Procedure: Autograft/Spine Surg Only (W/Harvest Graft); Local (Eg, Rib/Spinous Proc, Ples Specter) Obtain From Same Incis;  Surgeon: Nemiah Commander, MD;  Location[facility] OR [facility];  Service: Ortho Spine   ??? PR INSJ BIOMCHN DEV INTERVERTEBRAL DSC SPC W/ARTHRD N/A 03/31/2017    Procedure: Insert Interbody Biomechanical Device(S) With Integral Anterior Instrument For Device Anchoring, When Performed, To Intervertebral Disc Space In Conjunction With Interbody Arthrodesis, Each Interspace x2;  Surgeon: Nemiah Commander, MD;  [facility] OR [facility];  Service: Ortho Spine   ??? PR IONM 1 ON 1 IN OR W/ATTENDANCE EACH 15 MINUTES N/A 03/31/2017    Procedure: Continuous Intraoperative Neurophysiology Monitoring In Or;  Surgeon: Nemiah Commander, MD;  Location: [facility];  Service: Ortho Spine   ??? PR NERVOUS SYSTEM SURGERY UNLISTED N/A 03/21/2016 Procedure: UNLISTED PROC NERVOUS SYSTEM;  Surgeon: Coralee Pesa, MD;  [facility]: [facility];  Service: Orthopedics   ??? PR RAD EXCIS WRIST SYNOV/TENDON,FLEXOR Left 03/21/2016    Procedure: RAD EXC BURSA WRIST TENDON SHEATHS; FLEXORS;  Surgeon: Coralee Pesa, MD;  [facility]: [facility];  Service: Orthopedics   ??? PR REVISE MEDIAN N/CARPAL TUNNEL SURG Left 10/08/2016    Procedure: NEUROPLASTY AND/OR TRANSPOSITION; MEDIAN NERVE AT CARPAL TUNNEL--MOD 22--REVISION;  Surgeon: Garnett Farm, MD;  [facility];  Service: Ortho Hand   ??? PR REVISE MEDIAN N/CARPAL TUNNEL SURG Right 10/22/2016    Procedure: NEUROPLASTY AND/OR TRANSPOSITION; MEDIAN NERVE AT CARPAL TUNNEL;  Surgeon: Garnett Farm, MD;  [facility];  Service: Ortho Hand   ??? PR TOTAL HIP ARTHROPLASTY Right 12/28/2015    Procedure: ARTHROPLASTY, ACETABULAR & PROXIMAL FEMORAL PROSTHETIC REPLACEMENT (TOTAL HIP), W/WO AUTOGRAFT/ALLOGRAFT;  Surgeon: Malka So, MD;  Location: [facility] OR [facility];  Service: Orthopedics   ??? PR WRIST ARTHROSCOP,RELEASE XVERS LIG Left 03/21/2016    Procedure: LEFT ENDOSCOPIC CARPAL TUNNEL RELEASE;  Surgeon: Coralee Pesa, MD;  Location: [facility];  Service: Orthopedics     Social History:  Social History     Tobacco Use   ??? Smoking status: Current Every Day Smoker     Packs/day: 1.00     Years: 30.00     Pack years: 30.00     Types: Cigarettes     Start date:  05/19/1984   ??? Smokeless tobacco: Former Neurosurgeon     Types: Snuff   Substance Use Topics   ??? Alcohol use: No     Alcohol/week: 0.0 standard drinks     Comment: recovering   ??? Drug use: No     Family History:  Arthritis - mother, father not sure what kind     Objective   Vitals:    01/05/18 0932   BP: 138/94   BP Site: L Arm   BP Position: Sitting   BP Cuff Size: Large   Pulse: 70   Temp: 35.6 ??C   TempSrc: Oral   Weight: (!) 112.9 kg (248 lb 14.4 oz)     Physical Exam  General:   Patient is well appearing, and does not appear to be in any acute distress   Eyes:   PERRLA, EOMI, and sclera aniteric.   ENT:   MMM. Oropharhynx without any erythema or exudate.  No oropharyngeal exudates or ulcerations    Neck:   Supple, no JVD, no cervical lymphadenopathy   Cardiovascular:  normal rate, regular rhythm.  S1 and S2 normal, without any murmur, rub, or gallop.   Pulmonary:  Clear to auscultation bilaterally, without                           Wheezes/crackles/rhonchi.  Good air movement. Normal work of breathing.   Skin:    No rash.lesions/breakdown. Normal turgor and elasticity.    No Raynaud???s phenomenon or digital ulcers.    Psychiatry:   Mood and affect appropriate and congruent.    GI:   Normoactive bowel sounds, abdomen soft, non-tender and not distended, no hepatosplenomegaly or masses. No rebound or guarding.    Extremities:   Warm and well perfused. No bilateral cyanosis, clubbing or edema.   Musculo Skeletal:   Tenderness and swelling noted in  bilateral 2,3,4 MCPs changed from previous visit, he has bilateral grip strength.   elbow contractures right, normal ROM b/l shoulders.  Knee crepitus.  Positive MTP squeeze test , limited external rotation b/l hips.  No synovitis of the ankles or toes.     Neurological:  Cranial nerves III-XII grossly intact.  Strength 5/5        Test Results  No visits with results within 4 Week(s) from this visit.   Latest known visit with results is:   Office Visit on 06/09/2017   Component Date Value   ??? ALT 06/09/2017 50    ??? AST 06/09/2017 30    ??? Albumin 06/09/2017 4.5    ??? Creatine Kinase, Total 06/09/2017 231.0    ??? BUN 06/09/2017 10    ??? WBC 06/09/2017 8.1    ??? RBC 06/09/2017 5.33    ??? HGB 06/09/2017 17.6*   ??? HCT 06/09/2017 52.1    ??? MCV 06/09/2017 97.6    ??? MCH 06/09/2017 33.0    ??? MCHC 06/09/2017 33.8    ??? RDW 06/09/2017 14.6    ??? MPV 06/09/2017 7.7    ??? Platelet 06/09/2017 217    ??? Neutrophils % 06/09/2017 51.5    ??? Lymphocytes % 06/09/2017 35.6    ??? Monocytes % 06/09/2017 6.1    ??? Eosinophils % 06/09/2017 2.3    ??? Basophils % 06/09/2017 1.0    ??? Absolute Neutrophils 06/09/2017 4.2    ??? Absolute Lymphocytes 06/09/2017 2.9    ??? Absolute Monocytes 06/09/2017 0.5    ??? Absolute Eosinophils 06/09/2017  0.2    ??? Absolute Basophils 06/09/2017 0.1    ??? Large Unstained Cells 06/09/2017 4    ??? Macrocytosis 06/09/2017 Slight*

## 2018-01-19 MED ORDER — METHOTREXATE SODIUM 25 MG/ML INJECTION SOLUTION
2 refills | 0 days | Status: CP
Start: 2018-01-19 — End: 2018-05-19

## 2018-01-19 NOTE — Unmapped (Signed)
Methotrexate refill  Last ov: 01/05/18  Next ov: 04/13/18  Labs:   AST   Date Value Ref Range Status   01/05/2018 25 17 - 47 U/L Final     ALT   Date Value Ref Range Status   01/05/2018 39 <50 U/L Final     Creatinine   Date Value Ref Range Status   01/05/2018 0.78 0.70 - 1.30 mg/dL Final     WBC   Date Value Ref Range Status   01/05/2018 8.9 4.5 - 11.0 10*9/L Final     HGB   Date Value Ref Range Status   01/05/2018 17.1 13.5 - 17.5 g/dL Final     HCT   Date Value Ref Range Status   01/05/2018 51.9 41.0 - 53.0 % Final     MCV   Date Value Ref Range Status   01/05/2018 100.2 (H) 80.0 - 100.0 fL Final     RDW   Date Value Ref Range Status   01/05/2018 14.2 12.0 - 15.0 % Final     Platelet   Date Value Ref Range Status   01/05/2018 213 150 - 440 10*9/L Final     Neutrophils %   Date Value Ref Range Status   01/05/2018 52.7 % Final     Lymphocytes %   Date Value Ref Range Status   01/05/2018 34.6 % Final     Monocytes %   Date Value Ref Range Status   01/05/2018 5.9 % Final     Eosinophils %   Date Value Ref Range Status   01/05/2018 2.7 % Final     Basophils %   Date Value Ref Range Status   01/05/2018 0.6 % Final

## 2018-01-19 NOTE — Unmapped (Signed)
Patient does not need a refill of specialty medication at this time. Moving specialty refill reminder call to appropriate date and removed call attempt data.  Patient called back and states he still has 5 weeks worth of Methotrexate 25mg /ml ( 2 and half vials) and he also just got Dulera 200-69mcg on 12/24/2017 for 90 days supply, so he states he does not need any refills at this time. The only refill patient requested was Albuterol Inhaler. Estimated delivery date of Wednesday Dec 11th, 2019.

## 2018-01-20 MED FILL — ALBUTEROL SULFATE HFA 90 MCG/ACTUATION AEROSOL INHALER: 34 days supply | Qty: 36 | Fill #2

## 2018-01-20 MED FILL — ALBUTEROL SULFATE HFA 90 MCG/ACTUATION AEROSOL INHALER: 34 days supply | Qty: 36 | Fill #2 | Status: AC

## 2018-02-02 NOTE — Unmapped (Signed)
Pt will ask md about pain during methotrexate injections at January appt  Declined speaking with pharmacist at this time    [facility] Refill Coordination Note    Specialty Medication(s) to be Shipped:   Inflammatory Disorders: methotrextae (injectable)    Other medication(s) to be shipped: syringes     Joseph Sandoval, DOB: 1970/06/07  Phone: [phone] (home)       All above HIPAA information was verified with patient.     Completed refill call assessment today to schedule patient's medication shipment from the [facility] [phone]).       Specialty medication(s) and dose(s) confirmed: Regimen is correct and unchanged.   Changes to medications: [name] reports no changes reported at this time.  Changes to insurance: No  Questions for the pharmacist: No    The patient will receive a drug information handout for each medication shipped and additional FDA Medication Guides as required.      DISEASE/MEDICATION-SPECIFIC INFORMATION        For patients on injectable medications: Patient currently has 3 doses left.  Next injection is scheduled for 12/25.    SPECIALTY MEDICATION ADHERENCE     Medication Adherence    Patient reported X missed doses in the last month:  0  Specialty Medication:  methotrexate vials  Patient is on additional specialty medications:  No  Patient is on more than two specialty medications:  No  Any gaps in refill history greater than 2 weeks in the last 3 months:  no  Demonstrates understanding of importance of adherence:  yes  Informant:  patient  Reliability of informant:  reliable      Adherence tools used:  calendar          Confirmed plan for next specialty medication refill:  delivery by pharmacy  Refills needed for supportive medications:  not needed          Refill Coordination    Has the Patients' Contact Information Changed:  No  Is the Shipping Address Different:  No         Methotrexate 25mg /ml injection (vials): patient has 21 days of medication on hand      SHIPPING     Shipping address confirmed in Epic.     Delivery Scheduled: Yes, Expected medication delivery date: 1/3.     Medication will be delivered via UPS to the home address in Epic WAM.    Renette Butters   [facility] Specialty Technician

## 2018-02-10 MED FILL — BD TUBERCULIN SYRINGE 1 ML 25 GAUGE X 5/8": 84 days supply | Qty: 12 | Fill #1

## 2018-02-10 MED FILL — BD TUBERCULIN SYRINGE 1 ML 25 GAUGE X 5/8": 84 days supply | Qty: 12 | Fill #1 | Status: AC

## 2018-02-10 MED FILL — METHOTREXATE SODIUM 25 MG/ML INJECTION SOLUTION: 28 days supply | Qty: 4 | Fill #0 | Status: AC

## 2018-02-10 MED FILL — METHOTREXATE SODIUM 25 MG/ML INJECTION SOLUTION: 28 days supply | Qty: 4 | Fill #0

## 2018-02-18 MED ORDER — ADALIMUMAB 40 MG/0.8 ML SUBCUTANEOUS SYRINGE KIT
SUBCUTANEOUS | 3 refills | 0 days | Status: CP
Start: 2018-02-18 — End: 2019-02-18

## 2018-03-05 NOTE — Unmapped (Signed)
[facility] Refill Coordination Note    Specialty Medication(s) to be Shipped:   Inflammatory Disorders: methotrextae (injectable)    Other medication(s) to be shipped: proventil     Joseph Sandoval, DOB: 27-Nov-1970  Phone: [phone] (home)       All above HIPAA information was verified with patient.     Completed refill call assessment today to schedule patient's medication shipment from the [facility] [phone]).       Specialty medication(s) and dose(s) confirmed: Regimen is correct and unchanged.   Changes to medications: [name] reports no changes reported at this time.  Changes to insurance: No  Questions for the pharmacist: No    Confirmed patient received Welcome Packet with first shipment. The patient will receive a drug information handout for each medication shipped and additional FDA Medication Guides as required.       DISEASE/MEDICATION-SPECIFIC INFORMATION        For patients on injectable medications: Patient currently has 3 doses left.  Next injection is scheduled for today.    SPECIALTY MEDICATION ADHERENCE     Medication Adherence    Patient reported X missed doses in the last month:  0  Specialty Medication:  methotrexate vials  Patient is on additional specialty medications:  No  Patient is on more than two specialty medications:  No  Any gaps in refill history greater than 2 weeks in the last 3 months:  no  Demonstrates understanding of importance of adherence:  yes  Informant:  patient  Reliability of informant:  reliable      Adherence tools used:  calendar          Confirmed plan for next specialty medication refill:  delivery by pharmacy  Refills needed for supportive medications:  not needed          Refill Coordination    Has the Patients' Contact Information Changed:  No  Is the Shipping Address Different:  No           Methotrexate 25mg /ml injection solution: patient has 21 days of medication on hand      SHIPPING     Shipping address confirmed in Epic.     Delivery Scheduled: Yes, Expected medication delivery date: 1/29.     Medication will be delivered via UPS to the home address in Epic WAM.    Renette Butters   [facility] Specialty Technician

## 2018-03-10 MED FILL — METHOTREXATE SODIUM 25 MG/ML INJECTION SOLUTION: 28 days supply | Qty: 4 | Fill #1 | Status: AC

## 2018-03-10 MED FILL — ALBUTEROL SULFATE HFA 90 MCG/ACTUATION AEROSOL INHALER: 25 days supply | Qty: 6.7 | Fill #3

## 2018-03-10 MED FILL — METHOTREXATE SODIUM 25 MG/ML INJECTION SOLUTION: 28 days supply | Qty: 4 | Fill #1

## 2018-03-10 MED FILL — ALBUTEROL SULFATE HFA 90 MCG/ACTUATION AEROSOL INHALER: 25 days supply | Qty: 7 | Fill #3 | Status: AC

## 2018-03-17 ENCOUNTER — Ambulatory Visit: Admit: 2018-03-17 | Discharge: 2018-03-17 | Attending: Orthopaedic Surgery | Primary: Orthopaedic Surgery

## 2018-03-17 ENCOUNTER — Ambulatory Visit: Admit: 2018-03-17 | Discharge: 2018-03-17

## 2018-03-17 DIAGNOSIS — M5412 Radiculopathy, cervical region: Secondary | ICD-10-CM

## 2018-03-17 DIAGNOSIS — Z981 Arthrodesis status: Principal | ICD-10-CM

## 2018-03-17 NOTE — Unmapped (Addendum)
??   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 Recommendations/Plan  ?? Medications: Take over-the-counter medications as needed and as tolerated  ?? If you don't have liver disease, the safest medication for pain is acetaminophen (Tylenol).  Take 1000mg  (2 extra strength tabs) at a time for pain relief.  Do not take more than 3000mg  per day.  ?? Activities as tolerated.  ?? Imaging studies: none  ?? Cervical AP, lateral, flexion, and extension views (prior to next visit).  ?? Follow-up: To be scheduled in about 6 week(s).     ?? Avoid nicotine/tobacco  ?? Minimize alcohol intake  ?? Maintain a healthy weight    ?? Stay physically active and exercise regularly as tolerated ([email or web address])  ?? Maintain good sleeping habits    ?? We want to improve, and you can help.  You may receive a survey asking you about your visit.  Please take a few minutes to complete the survey.  We will use your feedback to make improvements.    ?? Contact our team electronically via MyChart message to [facility] Clinical Staff.    ?? Contact our team at [phone] or [phone] with any questions/concerns.  ??

## 2018-03-18 NOTE — Unmapped (Signed)
ORTHOPAEDIC SPINE CLINIC NOTE       Nemiah Commander, MD  Associate Professor  [email or web address]  [phone]        Patient Name:Joseph Sandoval  MRN: [id number]  DOB: 12-Jul-1970    Date: 03/17/2018    PCP: [facility]    ASSESSMENT:     Joseph Sandoval  48 y.o. male  [phone] Joseph Sandoval  Former contruction. Recov EtOH/60yrs. Applying for disability.  Neuropathy. Afib. COPD. OSA. Bipolar. Tob. RA. HepC.  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 No c-radic.  S/p C5-7 ACDF (Monday 03/31/17 at [facility])  XR-C 03/2018 - C7 fractured screw, protruding 1-72mm.      PLAN:     ?? Your diagnosis: Cervical Fusion C5-7 ACDF 03/31/17  ??  ?? Recommendations/Plan  ? C7 screw appears to have broken with some loosening.  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  We will plan to schedule follow-up in 6 weeks to assess for any pseudoarthrosis or loosening.  ? Medications: Take over-the-counter medications as needed and as tolerated  ? If you don't have liver disease, the safest medication for pain is acetaminophen (Tylenol).  Take 1000mg  (2 extra strength tabs) at a time for pain relief.  Do not take more than 3000mg  per day.  ? Activities as tolerated.  ? Imaging studies: none  ? Cervical AP, lateral, flexion, and extension views (prior to next visit).  ? Follow-up: To be scheduled in about 6 week(s).      SUBJECTIVE:     Chief Complaint:  S/p C5-7 ACDF 03/31/2017    History of Present Illness:        03/17/18 1340   PainSc:   7     Patient is partially 1 year status post C5-7 ACDF.  Patient reports improved right arm pain however some persistent left arm pain.  Patient also reports some mild left hand swelling and numbness.    Review of Systems:  Fever/chills: denies    Medical History   Past Medical History:   Diagnosis Date   ??? Alcoholism (CMS-HCC)    ??? Anxiety    ??? Arthritis    ??? Asthma    ??? Atrial fibrillation (CMS-HCC)    ??? Bipolar affective (CMS-HCC)    ??? Bronchitis    ??? Depression    ??? Headache    ??? Hepatitis C    ??? Hyperlipidemia    ??? Hypertension    ??? Infectious viral hepatitis    ??? Joint pain    ??? Lung disease    ??? Polysubstance abuse (CMS-HCC)    ??? RA (rheumatoid arthritis) (CMS-HCC)    ??? Seizures (CMS-HCC)    ??? Shoulder injury    ??? Sleep apnea, obstructive    ??? Sleeping difficulties       Surgical History   He  has a past surgical history that includes Cardiac surgery; pr total hip arthroplasty (Right, 12/28/2015); pr wrist arthroscop,release xvers lig (Left, 03/21/2016); pr nervous system surgery unlisted (N/A, 03/21/2016); pr rad excis wrist synov/tendon,flexor (Left, 03/21/2016); pr revise median n/carpal tunnel surg (Left, 10/08/2016); pr revise median n/carpal tunnel surg (Right, 10/22/2016); pr arthrodesis ant interbody inc discectomy, cervical below c2 (N/A, 03/31/2017); pr arthrodesis ant interbody inc discectomy, cervical below c2 each addl (N/A, 03/31/2017); pr anterior instrumentation 2-3 vertebral segments (N/A, 03/31/2017); pr insj biomchn dev intervertebral dsc spc w/arthrd (  N/A, 03/31/2017); pr autograft spine surgery local from same incision (N/A, 03/31/2017); pr allograft for spine surgery only morselized (N/A, 03/31/2017); and pr ionm 1 on 1 in or w/attendance each 15 minutes (N/A, 03/31/2017).     Allergies   Lisinopril   Medications   Current Outpatient Medications   Medication Sig Dispense Refill   ??? acetaminophen (TYLENOL) 500 MG tablet Take 1,000 mg by mouth every six (6) hours as needed for pain.     ??? acetaminophen (TYLENOL) 500 MG tablet TAKE 2 TABLETS (1 000MG ) BY MOUTH EVERY 8 HOURS FOR 7 DAYS 42 each 0   ??? adalimumab (HUMIRA) 40 mg/0.8 mL injection Inject 0.8 mL (40 mg total) under the skin every seven (7) days. Dose change to weekly 12 each 3   ??? albuterol HFA 90 mcg/actuation inhaler INHALE 2 PUFFS EVERY SIX (6) HOURS AS NEEDED FOR WHEEZING. 20.1 g 6   ??? albuterol HFA 90 mcg/actuation inhaler INHALE 2 PUFFS EVERY SIX (6) HOURS AS NEEDED FOR WHEEZING. 20.1 g 3   ??? amLODIPine (NORVASC) 5 MG tablet Take 10 mg by mouth daily.      ??? benztropine (COGENTIN) 0.5 MG tablet Take 0.5 mg by mouth Two (2) times a day.     ??? buPROPion (WELLBUTRIN XL) 150 MG 24 hr tablet Take 150 mg by mouth daily.     ??? hydroCHLOROthiazide (HYDRODIURIL) 25 MG tablet Take 25 mg by mouth daily.  4   ??? methotrexate 25 mg/mL injection solution INJECT 1 ML (25 MG TOTAL) UNDER THE SKIN ONCE A WEEK. 4 mL 2   ??? metoprolol tartrate (LOPRESSOR) 25 MG tablet Take 50 mg by mouth Two (2) times a day.      ??? mometasone-formoterol 200-5 mcg/actuation HFAA INHALE 2 PUFFS TWO (2) TIMES A DAY. 39 g 6   ??? mometasone-formoterol 200-5 mcg/actuation HFAA INHALE 2 PUFFS TWO (2) TIMES A DAY. 39 g 3   ??? mupirocin (BACTROBAN) 2 % ointment APPLY TOPICALLY TO BOTH NARES AND GENTLY MASSAGE. DO THIS TWICE DAILY FOR 3 DAYS .BEGIN 3 DAYS PRIOR TO SURGERY 22 g 0   ??? polyethylene glycol (MIRALAX) 17 gram packet MIX 1 PACKET IN 4-8 OUNCES OF WATER,JUICE,SODA,COFFEE,OR TEA AND DRINK DAILY EVERY MORNING FOR 7 DAYS . HOLD IF LOOSE STOOLS. 7 each 0   ??? risperiDONE (RISPERDAL) 2 MG tablet Take 2 mg by mouth nightly.     ??? senna (SENOKOT) 8.6 mg tablet TAKE 2 TABLETS BY MOUTH NIGHTLY AS NEEDED FOR CONSTIPATION . HOLD IF LOOSE STOOLS. 20 each 0   ??? sertraline (ZOLOFT) 50 MG tablet Take 100 mg by mouth daily.   1   ??? simvastatin (ZOCOR) 20 MG tablet Take 40 mg by mouth nightly.      ??? syringe with needle 1 mL 25 gauge x 5/8 Syrg Use as directed to inject methotrexate 12 each PRN   ??? gabapentin (NEURONTIN) 400 MG capsule Take 400 mg by mouth Three (3) times a day.     ??? ondansetron (ZOFRAN-ODT) 4 MG disintegrating tablet DISSOLVE 1 TABLET ON TONGUE EVERY 8 HOURS AS NEEDED FOR NAUSEA (Patient not taking: Reported on 03/17/2018) 20 tablet 0   ??? predniSONE (DELTASONE) 5 MG tablet Please take 15 mg for 4 days, 10 mg for 4 days and 5 mg for 4 days (Patient not taking: Reported on 03/17/2018) 24 tablet 0   ??? ranitidine (ZANTAC) 150 MG tablet Take 150 mg by mouth Two (2) times a day.  No current facility-administered medications for this visit.       Social History   Social History     Social History Narrative   ??? Not on file     He  reports that he has been smoking cigarettes. He started smoking about 33 years ago. He has a 30.00 pack-year smoking history. He has quit using smokeless tobacco.  His smokeless tobacco use included snuff. He reports that he does not drink alcohol or use drugs.   The history recorded in the table above was reviewed.     OBJECTIVE:     PHYSICAL EXAM (6+pts):  Vitals: BP 131/81  - Pulse 73  - Temp 36.4 ??C  - Ht 172.7 cm (5' 7.99)  - Wt (!) 111.8 kg (246 lb 6.4 oz)  - BMI 37.47 kg/m??   Appearance: well-nourished and no acute distress   Affect: alert and cooperative  Gait: normal  Extremities: Intact bilateral grip strength.  Neurologic: Positive Spurling's left   Skin: No cyanosis, clubbing, and normal skin in bilat hands.  Normal skin in the Cervical spine.  Incision healed.     MEDICAL DECISION MAKING    Test Results:  Imaging:   C-spine XR. [place]. Status post C5 7 ACDF with concern for failure of C7 screw and possible pseudoarthrosis at C6-7    Dr. Henreitta Leber, personally interpreted the images. Images were reviewed with the patient on the PACS monitor. The available reports were reviewed.     Discussion:  ?? Clinical findings, diagnostic/treatment options, and plan were discussed with the patient.  ?? Activities - Advised gradual return to normal activities, using pain as a guide.  ?? Activities - Advised activities as tolerated, using pain as a guide.  ?? Activities - Advised falls precautions (assistive device, rugs, lights, grab bars)  ?? Conservative Care - Options discussed.  ?? Imaging - Discussed pros/cons of advanced imaging options.  ?? Re-iterated that we do not routinely provide pain medications.  ?? Smoking cessation recommended and advised patient to call [place] [facility]uit[facility] (1-800-QUIT-NOW).    E&M Coding:  ?? Chronic Problem - 1 stable / Interpret imaging AND OTC/PT - Level 3     cc: Self, Referred, [facility]

## 2018-03-19 NOTE — Unmapped (Signed)
I, Nemiah Commander, MD, saw and evaluated the patient, participating in the key elements of the service.  I discussed the findings, assessment and plan with the resident and agree with resident???s findings and plan as documented in the resident's note.

## 2018-03-20 NOTE — Unmapped (Signed)
[facility] Clinical Assessment Telephone Call   Medication: subq  methotrexate    Unable to reach patient to complete required clinical assessment for subq methotrexate filled through the [facility].     First attempt to reach patient (03/20/18). Number not in service.     Jeneen Montgomery

## 2018-03-24 NOTE — Unmapped (Signed)
[facility]: Rheumatology Clinic Assessment Call    Specialty Medication(s): sub methotrexate  Indication(s): rheumatoid arthritis     Joseph Sandoval, DOB: Oct 11, 1970  Above HIPAA information was verified with patient.      Medications reviewed & verified: Allergies - Medications -      Specialty medication(s) & dose(s) confirmed: yes  Changes to medications: no  Tolerating medications:   Adverse Effects    *All other systems reviewed and are negative          CLINICAL ASSESSMENT     Joseph Sandoval. Guidroz reports tolerating subq methotrexate well without adverse effects.  Patient is still taking Humira once weekly from [place].  Adherence to therapy confirmed with patient and refill Sandoval @ [facility].  Patient reports zero missed dose(s) of methotrexate the past few month(s).  He did miss 2 doses of Humira ~1.5 months ago due to issues with renewing Humira mfg assistance application.  Clinically, rheumatoid arthritis not improving. Still having flares and joint pain/inflammation.  Next appt with Dr. Irving Burton in less than a month and likely plan to change therapy per last clinic documentation.  Patient is reporting some vision changes and not sure if this is from progression of RA.  Dr. Irving Burton inform.     Does Joseph Sandoval have follow up appointment scheduled with clinic? Yes, appointment is scheduled and patient is aware    All questions were answered and contact information provided for any future questions/concerns.      Jeneen Montgomery

## 2018-04-03 NOTE — Unmapped (Signed)
Patient states he has 3 vials on hand, rescheduling refill call for 3 weeks

## 2018-04-13 ENCOUNTER — Encounter: Admit: 2018-04-13 | Discharge: 2018-04-14 | Payer: MEDICARE

## 2018-04-13 ENCOUNTER — Ambulatory Visit: Admit: 2018-04-13 | Discharge: 2018-04-14 | Payer: MEDICARE | Attending: Rheumatology | Primary: Rheumatology

## 2018-04-13 DIAGNOSIS — Z7982 Long term (current) use of aspirin: Principal | ICD-10-CM

## 2018-04-13 DIAGNOSIS — Z96641 Presence of right artificial hip joint: Principal | ICD-10-CM

## 2018-04-13 DIAGNOSIS — R51 Headache: Principal | ICD-10-CM

## 2018-04-13 DIAGNOSIS — M6589 Other synovitis and tenosynovitis, multiple sites: Principal | ICD-10-CM

## 2018-04-13 DIAGNOSIS — M0579 Rheumatoid arthritis with rheumatoid factor of multiple sites without organ or systems involvement: Principal | ICD-10-CM

## 2018-04-13 DIAGNOSIS — F1910 Other psychoactive substance abuse, uncomplicated: Principal | ICD-10-CM

## 2018-04-13 DIAGNOSIS — M5430 Sciatica, unspecified side: Principal | ICD-10-CM

## 2018-04-13 DIAGNOSIS — B159 Hepatitis A without hepatic coma: Principal | ICD-10-CM

## 2018-04-13 DIAGNOSIS — J45909 Unspecified asthma, uncomplicated: Principal | ICD-10-CM

## 2018-04-13 DIAGNOSIS — E785 Hyperlipidemia, unspecified: Principal | ICD-10-CM

## 2018-04-13 DIAGNOSIS — Z791 Long term (current) use of non-steroidal anti-inflammatories (NSAID): Principal | ICD-10-CM

## 2018-04-13 DIAGNOSIS — R569 Unspecified convulsions: Principal | ICD-10-CM

## 2018-04-13 DIAGNOSIS — G4733 Obstructive sleep apnea (adult) (pediatric): Principal | ICD-10-CM

## 2018-04-13 DIAGNOSIS — S4990XA Unspecified injury of shoulder and upper arm, unspecified arm, initial encounter: Principal | ICD-10-CM

## 2018-04-13 DIAGNOSIS — F319 Bipolar disorder, unspecified: Principal | ICD-10-CM

## 2018-04-13 DIAGNOSIS — M899 Disorder of bone, unspecified: Principal | ICD-10-CM

## 2018-04-13 DIAGNOSIS — I4891 Unspecified atrial fibrillation: Principal | ICD-10-CM

## 2018-04-13 DIAGNOSIS — M5136 Other intervertebral disc degeneration, lumbar region: Principal | ICD-10-CM

## 2018-04-13 DIAGNOSIS — M25551 Pain in right hip: Principal | ICD-10-CM

## 2018-04-13 DIAGNOSIS — M1990 Unspecified osteoarthritis, unspecified site: Principal | ICD-10-CM

## 2018-04-13 DIAGNOSIS — G629 Polyneuropathy, unspecified: Principal | ICD-10-CM

## 2018-04-13 DIAGNOSIS — M2550 Pain in unspecified joint: Principal | ICD-10-CM

## 2018-04-13 DIAGNOSIS — F17210 Nicotine dependence, cigarettes, uncomplicated: Principal | ICD-10-CM

## 2018-04-13 DIAGNOSIS — Z79899 Other long term (current) drug therapy: Principal | ICD-10-CM

## 2018-04-13 DIAGNOSIS — M069 Rheumatoid arthritis, unspecified: Principal | ICD-10-CM

## 2018-04-13 DIAGNOSIS — J40 Bronchitis, not specified as acute or chronic: Principal | ICD-10-CM

## 2018-04-13 DIAGNOSIS — M4317 Spondylolisthesis, lumbosacral region: Principal | ICD-10-CM

## 2018-04-13 DIAGNOSIS — M1611 Unilateral primary osteoarthritis, right hip: Principal | ICD-10-CM

## 2018-04-13 DIAGNOSIS — M25552 Pain in left hip: Principal | ICD-10-CM

## 2018-04-13 DIAGNOSIS — F1020 Alcohol dependence, uncomplicated: Principal | ICD-10-CM

## 2018-04-13 DIAGNOSIS — I10 Essential (primary) hypertension: Principal | ICD-10-CM

## 2018-04-13 DIAGNOSIS — G479 Sleep disorder, unspecified: Principal | ICD-10-CM

## 2018-04-13 DIAGNOSIS — J984 Other disorders of lung: Principal | ICD-10-CM

## 2018-04-13 DIAGNOSIS — F419 Anxiety disorder, unspecified: Principal | ICD-10-CM

## 2018-04-13 DIAGNOSIS — F329 Major depressive disorder, single episode, unspecified: Principal | ICD-10-CM

## 2018-04-13 DIAGNOSIS — B1920 Unspecified viral hepatitis C without hepatic coma: Principal | ICD-10-CM

## 2018-04-13 LAB — CBC W/ AUTO DIFF
BASOPHILS ABSOLUTE COUNT: 0.1 10*9/L (ref 0.0–0.1)
BASOPHILS RELATIVE PERCENT: 1.1 %
EOSINOPHILS ABSOLUTE COUNT: 0.2 10*9/L (ref 0.0–0.4)
EOSINOPHILS RELATIVE PERCENT: 2.5 %
HEMOGLOBIN: 17 g/dL (ref 13.5–17.5)
LARGE UNSTAINED CELLS: 5 % — ABNORMAL HIGH (ref 0–4)
LYMPHOCYTES ABSOLUTE COUNT: 3.2 10*9/L (ref 1.5–5.0)
LYMPHOCYTES RELATIVE PERCENT: 33.2 %
MEAN CORPUSCULAR HEMOGLOBIN CONC: 33.8 g/dL (ref 31.0–37.0)
MEAN CORPUSCULAR HEMOGLOBIN: 34 pg (ref 26.0–34.0)
MEAN CORPUSCULAR VOLUME: 100.7 fL — ABNORMAL HIGH (ref 80.0–100.0)
MONOCYTES ABSOLUTE COUNT: 0.5 10*9/L (ref 0.2–0.8)
MONOCYTES RELATIVE PERCENT: 5.3 %
NEUTROPHILS ABSOLUTE COUNT: 5.1 10*9/L (ref 2.0–7.5)
NEUTROPHILS RELATIVE PERCENT: 53.3 %
PLATELET COUNT: 236 10*9/L (ref 150–440)
RED BLOOD CELL COUNT: 5 10*12/L (ref 4.50–5.90)
RED CELL DISTRIBUTION WIDTH: 14.3 % (ref 12.0–15.0)
WBC ADJUSTED: 9.5 10*9/L (ref 4.5–11.0)
WBC ADJUSTED: 9.5 10*9/L — ABNORMAL HIGH (ref 4.5–11.0)

## 2018-04-13 LAB — CREATININE
CREATININE: 0.78 mg/dL (ref 0.70–1.30)
EGFR CKD-EPI AA MALE: >90 mL/min/{1.73_m2} (ref >=60)

## 2018-04-13 NOTE — Unmapped (Signed)
Assessment/Recommendations:   Joseph Sandoval is a 48 y.o. Caucasian male with history of prior polysubstance abuse, Hep C s/p Harvoni (fibroscan F1), HTN and neuropathy who presents for follow up of seropositive (+RF, +anti-CCP) rheumatoid arthritis.     1. Seropositive nonerosive RA: Patient is stable today in regards to his hands, no signs of active synovitis today.Joseph Sandoval  He is stable on the Humira and MTX so we will continue this regimen.    - labs today CBC w/ diff, BUN/Cr, LFTs  - Continue  methotrexate 25 mg sq weekly +folic acid daily   - Continue Humira 40 mg sub q weekly   - continue tylenol prn, avoiding prednisone due to AVN      2.  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  We will get imaging and he has an appt with Dr. Jaynie Northway (Orthopedics at the end of the month).  Ideally, in the setting of hip replacement on the right and history of bilateral hip AVN, I prefer that Ortho evaluate him first.  Differential at this time includes radiculopathy/sciatica versus hip OA versus spinal stenosis versus avn  - Xray of left hip and lumbar spine ordered   - exercises given for Sciatica and Hip OA    3. Immunizations   - UTD with pneumonia vaccines   - UTD with flu     RTC in 4 months or sooner prn.     Patient was discussed with attending physician, Dr. Marland Mcalpine    HPI:  Joseph Sandoval is a 48 y.o. Caucasian male with history of prior polysubstance abuse, Hep C s/p Harvoni, HTN and neuropathy who presents for follow up of seropositive (+RF, +anti-CCP) rheumatoid arthritis.        Interm History:  Patient was last seen in Nov 2019.  Since then he has been on Humira weekly and methotrexate subcu weekly. In regards to his hands, he is doing well.  Notes that compared to last time his hands, elbows, wrists feel better.  However he continues to have significant pain in his left hip.  He notes pain in his low back and on the lateral side of his hip.  Feels that the pain is radiating down.    He notes about 15-20 minutes of stiffness.      Other wise, denies any rashes or lesions.        Disease History:    He was initially referred 05/2015 after hepatology checked RF which +.  He had synovitis of MCPs, PIPs, wrists at the time and anti-CCP low positive.  He has hx of chronic back pain and opioid dependence.  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 He was started on MTX after clarifying with hepatology (fibroscan F1 only).  He was seen for follow up 08/2015 and transitioned from po to sq methotrexate now on 25 mg weekly.  He tolerates well without GI side effects.  At last visit 11/2015, he had only partial improvement so discussed adding TNF-I but this was deferred given upcoming THA.  He also underwent carpal tunnel release on the left 03/2016 due to paresthesias without improvement.   He had an MRI spine and referred to ortho spine due to concern for cervical radiculopathy as cause of his symptoms/paresthesias.    He was off off  MTX for about 1 month since he started getting nodules on  his back and stopped. During his last visit in 6/18 pt was restarted on MTX 25 mg and also HCQ was added.   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 Alcoholism (CMS-HCC)    ??? Anxiety    ??? Arthritis    ??? Asthma    ??? Atrial fibrillation (CMS-HCC)    ??? Bipolar affective (CMS-HCC)    ??? Bronchitis    ??? Depression    ??? Headache    ??? Hepatitis C    ??? Hyperlipidemia    ??? Hypertension    ??? Infectious viral hepatitis    ??? Joint pain    ??? Lung disease    ??? Polysubstance abuse (CMS-HCC)    ??? RA (rheumatoid arthritis) (CMS-HCC)    ??? Seizures (CMS-HCC)    ??? Shoulder injury    ??? Sleep apnea, obstructive    ??? Sleeping difficulties      Allergies:  Lisinopril    Medications:     Current outpatient prescriptions:   ???  amLODIPine (NORVASC) 5 MG tablet, Take 5 mg by mouth daily., Disp: , Rfl:   ???  aspirin (ECOTRIN) 81 MG tablet, Take 81 mg by mouth daily., Disp: , Rfl:   ???  baclofen (LIORESAL) 10 MG tablet, Take 10 mg by mouth Three (3) times a day. As needed, Disp: , Rfl:   ???  benztropine (COGENTIN) 0.5 MG tablet, Take 0.5 mg by mouth Two (2) times a day., Disp: , Rfl:   ???  carBAMazepine (TEGRETOL) 200 mg tablet, Take 200 mg by mouth Two (2) times a day., Disp: , Rfl:   ???  celecoxib (CELEBREX) 100 MG capsule, Take 100 mg by mouth Two (2) times a day. , Disp: , Rfl:   ???  diltiazem (CARDIZEM CD) 120 MG 24 hr capsule, Take 1 capsule (120 mg total) by mouth daily., Disp: 30 capsule, Rfl: 6  ???  gabapentin (NEURONTIN) 400 MG capsule, Take 400 mg by mouth Three (3) times a day., Disp: , Rfl:   ???  loratadine (CLARITIN) 10 mg tablet, Take 10 mg by mouth daily., Disp: , Rfl:   ???  metoprolol tartrate (LOPRESSOR) 25 MG tablet, Take 25 mg by mouth Two (2) times a day., Disp: , Rfl:   ???  naltrexone (DEPADE) 50 mg tablet, Take 50 mg by mouth nightly., Disp: , Rfl:   ???  ranitidine (ZANTAC) 150 MG tablet, Take 150 mg by mouth Two (2) times a day., Disp: , Rfl:   ???  risperiDONE (RISPERDAL) 2 MG tablet, Take 2 mg by mouth nightly., Disp: , Rfl:   ???  simvastatin (ZOCOR) 20 MG tablet, Take 20 mg by mouth nightly., Disp: , Rfl:   ???  [DISCONTINUED] hydrochlorothiazide (HYDRODIURIL) 25 MG tablet, Take 25 mg by mouth daily., Disp: , Rfl:   ???  albuterol sulfate (PROAIR RESPICLICK) 90 mcg/actuation AePB, Inhale., Disp: , Rfl:   ???  glucosamine HCl 500 mg Tab, Take 1 tablet by mouth Two (2) times a day., Disp: , Rfl:     Surgical History:  Past Surgical History:   Procedure Laterality Date   ??? CARDIAC SURGERY      2008 shock therapy   ??? PR ALLOGRAFT FOR SPINE SURGERY ONLY MORSELIZED N/A 03/31/2017    Procedure: Allograft For Spine Surgery Only; Morselized;  Surgeon: Julien Girt  Jaynie Schrack, MD;  Location: [facility];  Service: Ortho Spine   ??? PR ANTERIOR INSTRUMENTATION 2-3 VERTEBRAL SEGMENTS N/A 03/31/2017    Procedure: Ant Instrum; 2 To 3 Verteb Segmt Cervical;  Surgeon: Nemiah Commander, MD;  Location: [facility] OR [facility];  Service: Ortho Spine   ??? PR ARTHRODESIS ANT INTERBODY INC DISCECTOMY, CERVICAL BELOW C2 N/A 03/31/2017    Procedure: Arthrodes, Ant Intrbdy, Incl Disc Spc Prep, Discect, Osteophyt/Decompress Spinl Crd &/Or Nrv Rt, Crv Blo C2;  Surgeon: Nemiah Commander, MD;  Location: [facility] OR [facility];  Service: Ortho Spine   ??? PR ARTHRODESIS ANT INTERBODY INC DISCECTOMY, CERVICAL BELOW C2 EACH ADDL N/A 03/31/2017    Procedure: Arthrod, Ant Intbdy, Incl Disc Spc Prep/Disctmy/Ostephyt/Decmpr Spnl Crd/Nrv Rt; Cerv Belo C2, Ea Add`L Spc;  Surgeon: Nemiah Commander, MD;  Location: [facility] OR [facility];  Service: Ortho Spine   ??? PR AUTOGRAFT SPINE SURGERY LOCAL FROM SAME INCISION N/A 03/31/2017    Procedure: Autograft/Spine Surg Only (W/Harvest Graft); Local (Eg, Rib/Spinous Proc, Ples Specter) Obtain From Same Incis;  Surgeon: Nemiah Commander, MD;  Location: [facility] OR [facility];  Service: Ortho Spine   ??? PR INSJ BIOMCHN DEV INTERVERTEBRAL DSC SPC W/ARTHRD N/A 03/31/2017    Procedure: Insert Interbody Biomechanical Device(S) With Integral Anterior Instrument For Device Anchoring, When Performed, To Intervertebral Disc Space In Conjunction With Interbody Arthrodesis, Each Interspace x2;  Surgeon: Nemiah Commander, MD;  Location: [facility] OR [facility];  Service: Ortho Spine   ??? PR IONM 1 ON 1 IN OR W/ATTENDANCE EACH 15 MINUTES N/A 03/31/2017    Procedure: Continuous Intraoperative Neurophysiology Monitoring In Or;  Surgeon: Nemiah Commander, MD;  Location: [facility] OR [facility];  Service: Ortho Spine   ??? PR NERVOUS SYSTEM SURGERY UNLISTED N/A 03/21/2016    Procedure: UNLISTED PROC NERVOUS SYSTEM;  Surgeon: Coralee Pesa, MD;  Location: [facility];  Service: Orthopedics   ??? PR RAD EXCIS WRIST SYNOV/TENDON,FLEXOR Left 03/21/2016    Procedure: RAD EXC BURSA WRIST TENDON SHEATHS; FLEXORS;  Surgeon: Coralee Pesa, MD;  Location: [facility]; Service: Orthopedics   ??? PR REVISE MEDIAN N/CARPAL TUNNEL SURG Left 10/08/2016    Procedure: NEUROPLASTY AND/OR TRANSPOSITION; MEDIAN NERVE AT CARPAL TUNNEL--MOD 22--REVISION;  Surgeon: Garnett Farm, MD;  [facility];  Service: Ortho Hand   ??? PR REVISE MEDIAN N/CARPAL TUNNEL SURG Right 10/22/2016    Procedure: NEUROPLASTY AND/OR TRANSPOSITION; MEDIAN NERVE AT CARPAL TUNNEL;  Surgeon: Garnett Farm, MD;  [facility] OR [facility];  Service: Ortho Hand   ??? PR TOTAL HIP ARTHROPLASTY Right 12/28/2015    Procedure: ARTHROPLASTY, ACETABULAR & PROXIMAL FEMORAL PROSTHETIC REPLACEMENT (TOTAL HIP), W/WO AUTOGRAFT/ALLOGRAFT;  Surgeon: Malka So, MD;  [facility]: [facility] OR [facility];  Service: Orthopedics   ??? PR WRIST ARTHROSCOP,RELEASE XVERS LIG Left 03/21/2016    Procedure: LEFT ENDOSCOPIC CARPAL TUNNEL RELEASE;  Surgeon: Coralee Pesa, MD;  Location: [facility];  Service: Orthopedics     Social History:  Social History     Tobacco Use   ??? Smoking status: Current Every Day Smoker     Packs/day: 1.00     Years: 30.00     Pack years: 30.00     Types: Cigarettes     Start date: 05/19/1984   ??? Smokeless tobacco: Former Neurosurgeon     Types: Snuff   Substance Use Topics   ??? Alcohol use: No  Alcohol/week: 0.0 standard drinks     Comment: recovering   ??? Drug use: No     Family History:  Arthritis - mother, father not sure what kind     Objective   Vitals:    04/13/18 1116   BP: 140/80   BP Site: L Arm   BP Position: Sitting   BP Cuff Size: Large   Pulse: 77   Temp: 35.4 ??C   TempSrc: Oral   Weight: (!) 110.4 kg (243 lb 6.2 oz)     Physical Exam  General:   Patient is well appearing, and does not appear to be in any acute distress   Eyes:   PERRLA, EOMI, and sclera aniteric.   ENT:   MMM. Oropharhynx without any erythema or exudate.  No oropharyngeal exudates or ulcerations    Neck:   Supple, no JVD, no cervical lymphadenopathy   Cardiovascular:  normal rate, regular rhythm.  S1 and S2 normal, without any murmur, rub, or gallop.   Pulmonary:  Clear to auscultation bilaterally, without                           Wheezes/crackles/rhonchi.  Good air movement. Normal work of breathing.   Skin:    No rash.lesions/breakdown. Normal turgor and elasticity.    No Raynaud???s phenomenon or digital ulcers.    Psychiatry:   Mood and affect appropriate and congruent.    GI:   Normoactive bowel sounds, abdomen soft, non-tender and not distended, no hepatosplenomegaly or masses. No rebound or guarding.    Extremities:   Warm and well perfused. No bilateral cyanosis, clubbing or edema.   Musculo Skeletal:    Improved swelling over the MCPS noted.  Improved grip stregth L>R. Joseph Sandoval   elbow contractures right (stable), normal ROM b/l shoulders.  Knee crepitus, limited external rotation b/l hips.  L>R,  + straight leg test on the left side and tenderness noted over the lateral side.  No synovitis of the ankles or toes.     Neurological:  Cranial nerves III-XII grossly intact.  Strength 5/5        Test Results  No visits with results within 4 Week(s) from this visit.   Latest known visit with results is:   Office Visit on 01/05/2018   Component Date Value   ??? ALT 01/05/2018 39    ??? AST 01/05/2018 25    ??? BUN 01/05/2018 9    ??? Creatinine 01/05/2018 0.78    ??? EGFR CKD-EPI Non-African* 01/05/2018 >90    ??? EGFR CKD-EPI African Ame* 01/05/2018 >90    ??? WBC 01/05/2018 8.9    ??? RBC 01/05/2018 5.18    ??? HGB 01/05/2018 17.1    ??? HCT 01/05/2018 51.9    ??? MCV 01/05/2018 100.2*   ??? [facility] 01/05/2018 33.1    ??? MCHC 01/05/2018 33.0    ??? RDW 01/05/2018 14.2    ??? MPV 01/05/2018 7.6    ??? Platelet 01/05/2018 213    ??? Neutrophils % 01/05/2018 52.7    ??? Lymphocytes % 01/05/2018 34.6    ??? Monocytes % 01/05/2018 5.9    ??? Eosinophils % 01/05/2018 2.7    ??? Basophils % 01/05/2018 0.6    ??? Absolute Neutrophils 01/05/2018 4.7    ??? Absolute Lymphocytes 01/05/2018 3.1    ??? Absolute Monocytes 01/05/2018 0.5    ??? Absolute Eosinophils 01/05/2018 0.2    ??? Absolute Basophils 01/05/2018 0.1    ???  Large Unstained Cells 01/05/2018 3    ??? Macrocytosis 01/05/2018 Moderate*

## 2018-04-13 NOTE — Unmapped (Signed)
Patient Education      Patient Education        Sciatica: Care Instructions  Your Care Instructions    Sciatica (say sye-AT-ih-kuh) is an irritation of one of the sciatic nerves, which come from the spinal cord in the lower back. The sciatic nerves and their branches extend down through the buttock to the foot. Sciatica can develop when an injured disc in the back presses against a spinal nerve root. Its main symptom is pain, numbness, or weakness that is often worse in the leg or foot than in the back.  Sciatica often will improve and go away with time. Early treatment usually includes medicines and exercises to relieve pain.  Follow-up care is a key part of your treatment and safety. Be sure to make and go to all appointments, and call your doctor if you are having problems. It's also a good idea to know your test results and keep a list of the medicines you take.  How can you care for yourself at home?  ?? Take pain medicines exactly as directed.  ? If the doctor gave you a prescription medicine for pain, take it as prescribed.  ? If you are not taking a prescription pain medicine, ask your doctor if you can take an over-the-counter medicine.  ?? Use heat or ice to relieve pain.  ? To apply heat, put a warm water bottle, heating pad set on low, or warm cloth on your back. Do not go to sleep with a heating pad on your skin.  ? To use ice, put ice or a cold pack on the area for 10 to 20 minutes at a time. Put a thin cloth between the ice and your skin.  ?? Avoid sitting if possible, unless it feels better than standing.  ?? Alternate lying down with short walks. Increase your walking distance as you are able to without making your symptoms worse.  ?? Do not do anything that makes your symptoms worse.  When should you call for help?  Call 911 anytime you think you may need emergency care. For example, call if:  ?? ?? You are unable to move a leg at all.   ??Call your doctor now or seek immediate medical care if:  ?? ?? You have new or worse symptoms in your legs or buttocks. Symptoms may include:  ? Numbness or tingling.  ? Weakness.  ? Pain.   ?? ?? You lose bladder or bowel control.   ??Watch closely for changes in your health, and be sure to contact your doctor if:  ?? ?? You are not getting better as expected.   Where can you learn more?  Go to [facility] at [email or web address]  [employer] [facility] under [employer]. Enter Z239 in the search box to learn more about Sciatica: Care Instructions.  Current as of: August 06, 2017  Content Version: 12.3  ?? 2006-2019 [employer]. Care instructions adapted under license by [facility]. If you have questions about a medical condition or this instruction, always ask your healthcare professional. [employer], Inc[employer] disclaims any warranty or liability for your use of this information.         Hip Arthritis: Exercises  Introduction  Here are some examples of exercises for you to try. The exercises may be suggested for a condition or for rehabilitation. Start each exercise slowly. Ease off the exercises if you start to have pain.  You will be told when to start these exercises and  which ones will work best for you.  How to do the exercises  Straight-leg raises to the outside   1. Lie on your side, with your affected hip on top.  2. Tighten the front thigh muscles of your top leg to keep your knee straight.  3. Keep your hip and your leg straight in line with the rest of your body, and keep your knee pointing forward. Do not drop your hip back.  4. Lift your top leg straight up toward the ceiling, about 12 inches off the floor. Hold for about 6 seconds, then slowly lower your leg.  5. Repeat 8 to 12 times.  6. Switch legs and repeat steps 1 through 5, even if only one hip is sore.    Straight-leg raises to the inside   1. Lie on your side with your affected hip on the floor.  2. You can either prop up your other leg on a chair, or you can bend that knee and put that foot in front of your other knee. Do not drop your hip back.  3. Tighten the muscles on the front thigh of your bottom leg to straighten that knee.  4. Keep your kneecap pointing forward and your leg straight, and lift your bottom leg up toward the ceiling about 6 inches. Hold for about 6 seconds, then lower slowly.  5. Repeat 8 to 12 times.  6. Switch legs and repeat steps 1 through 5, even if only one hip is sore.    Hip hike   1. Stand sideways on the bottom step of a staircase, and hold on to the banister or wall.  2. Keeping both knees straight, lift your good leg off the step and let it hang down. Then hike your good hip up to the same level as your affected hip or a little higher.  3. Repeat 8 to 12 times.  4. Switch legs and repeat steps 1 through 3, even if only one hip is sore.    Bridging   1. Lie on your back with both knees bent. Your knees should be bent about 90 degrees.  2. Then push your feet into the floor, squeeze your buttocks, and lift your hips off the floor until your shoulders, hips, and knees are all in a straight line.  3. Hold for about 6 seconds as you continue to breathe normally, and then slowly lower your hips back down to the floor and rest for up to 10 seconds.  4. Repeat 8 to 12 times.    Hamstring stretch (lying down)   1. Lie flat on your back with your legs straight. If you feel discomfort in your back, place a small towel roll under your lower back.  2. Holding the back of your affected leg, lift your leg straight up and toward your body until you feel a stretch at the back of your thigh.  3. Hold the stretch for at least 30 seconds.  4. Repeat 2 to 4 times.  5. Switch legs and repeat steps 1 through 4, even if only one hip is sore.    Standing quadriceps stretch   1. If you are not steady on your feet, hold on to a chair, counter, or wall. You can also lie on your stomach or your side to do this exercise.  2. Bend the knee of the leg you want to stretch, and reach behind you to grab the front of your foot or ankle with the hand on  the same side. For example, if you are stretching your right leg, use your right hand.  3. Keeping your knees next to each other, pull your foot toward your buttock until you feel a gentle stretch across the front of your hip and down the front of your thigh. Your knee should be pointed directly to the ground, and not out to the side.  4. Hold the stretch for at least 15 to 30 seconds.  5. Repeat 2 to 4 times.  6. Switch legs and repeat steps 1 through 5, even if only one hip is sore.    Hip rotator stretch   1. Lie on your back with both knees bent and your feet flat on the floor.  2. Put the ankle of your affected leg on your opposite thigh near your knee.  3. Use your hand to gently push your knee away from your body until you feel a gentle stretch around your hip.  4. Hold the stretch for 15 to 30 seconds.  5. Repeat 2 to 4 times.  6. Repeat steps 1 through 5, but this time use your hand to gently pull your knee toward your opposite shoulder.  7. Switch legs and repeat steps 1 through 6, even if only one hip is sore.    Knee-to-chest   1. Lie on your back with your knees bent and your feet flat on the floor.  2. Bring your affected leg to your chest, keeping the other foot flat on the floor (or keeping the other leg straight, whichever feels better on your lower back).  3. Keep your lower back pressed to the floor. Hold for at least 15 to 30 seconds.  4. Relax, and lower the knee to the starting position.  5. Repeat 2 to 4 times.  6. Switch legs and repeat steps 1 through 5, even if only one hip is sore.  7. To get more stretch, put your other leg flat on the floor while pulling your knee to your chest.    Clamshell   1. Lie on your side, with your affected hip on top. Keep your feet and knees together and your knees bent.  2. Raise your top knee, but keep your feet together. Do not let your hips roll back. Your legs should open up like a clamshell.  3. Hold for 6 seconds.  4. Slowly lower your knee back down. Rest for 10 seconds.  5. Repeat 8 to 12 times.  6. Switch legs and repeat steps 1 through 5, even if only one hip is sore.    Follow-up care is a key part of your treatment and safety. Be sure to make and go to all appointments, and call your doctor if you are having problems. It's also a good idea to know your test results and keep a list of the medicines you take.  Where can you learn more?  Go to [facility] at [email or web address]  [employer] Library under [employer]. Enter [id number] in the search box to learn more about Hip Arthritis: Exercises.  Current as of: August 06, 2017  Content Version: 12.3  ?? 2006-2019 [employer]. Care instructions adapted under license by [facility]. If you have questions about a medical condition or this instruction, always ask your healthcare professional. [employer], [employer] disclaims any warranty or liability for your use of this information.

## 2018-04-23 NOTE — Unmapped (Signed)
[facility] Refill Coordination Note    Specialty Medication(s) to be Shipped:   Inflammatory Disorders: methotrexate (injectable)    Other medication(s) to be shipped: none (patient did not request refills of his inhalers (stated plenty on hand) but asked we contact office for new Rxs (existing Rxs on file have expired - Dulera and Albuteral)     Joseph Sandoval, DOB: 1970/10/25  Phone: [phone] (home) [phone] (work)      All above HIPAA information was verified with patient.     Completed refill call assessment today to schedule patient's medication shipment from the [facility] [phone]).       Specialty medication(s) and dose(s) confirmed: Regimen is correct and unchanged.   Changes to medications: Joseph Sandoval reports no changes reported at this time.  Changes to insurance: No  Questions for the pharmacist: No    Confirmed patient received Welcome Packet with first shipment. The patient will receive a drug information handout for each medication shipped and additional FDA Medication Guides as required.       DISEASE/MEDICATION-SPECIFIC INFORMATION        For patients on injectable medications: Patient currently has 1 doses left.  Next injection is scheduled for 04/30/18.    SPECIALTY MEDICATION ADHERENCE     Medication Adherence    Patient reported X missed doses in the last month:  1  Adherence tools used:  calendar                Methotrexate  25/1 mg/ml: 1 injection/7 days of medicine on hand         SHIPPING     Shipping address confirmed in Epic.     Delivery Scheduled: Yes, Expected medication delivery date: 04/28/18.     Medication will be delivered via UPS to the home address in [employer] [place].    Mervyn Gay   [facility] Specialty Pharmacist

## 2018-04-24 DIAGNOSIS — J40 Bronchitis, not specified as acute or chronic: Principal | ICD-10-CM

## 2018-04-24 DIAGNOSIS — J984 Other disorders of lung: Principal | ICD-10-CM

## 2018-04-24 DIAGNOSIS — F1020 Alcohol dependence, uncomplicated: Principal | ICD-10-CM

## 2018-04-24 DIAGNOSIS — I10 Essential (primary) hypertension: Principal | ICD-10-CM

## 2018-04-24 DIAGNOSIS — I4891 Unspecified atrial fibrillation: Principal | ICD-10-CM

## 2018-04-24 DIAGNOSIS — G479 Sleep disorder, unspecified: Principal | ICD-10-CM

## 2018-04-24 DIAGNOSIS — S4990XA Unspecified injury of shoulder and upper arm, unspecified arm, initial encounter: Principal | ICD-10-CM

## 2018-04-24 DIAGNOSIS — M069 Rheumatoid arthritis, unspecified: Principal | ICD-10-CM

## 2018-04-24 DIAGNOSIS — G4733 Obstructive sleep apnea (adult) (pediatric): Principal | ICD-10-CM

## 2018-04-24 DIAGNOSIS — F329 Major depressive disorder, single episode, unspecified: Principal | ICD-10-CM

## 2018-04-24 DIAGNOSIS — F419 Anxiety disorder, unspecified: Principal | ICD-10-CM

## 2018-04-24 DIAGNOSIS — E785 Hyperlipidemia, unspecified: Principal | ICD-10-CM

## 2018-04-24 DIAGNOSIS — F1910 Other psychoactive substance abuse, uncomplicated: Principal | ICD-10-CM

## 2018-04-24 DIAGNOSIS — M2550 Pain in unspecified joint: Principal | ICD-10-CM

## 2018-04-24 DIAGNOSIS — R51 Headache: Principal | ICD-10-CM

## 2018-04-24 DIAGNOSIS — F319 Bipolar disorder, unspecified: Principal | ICD-10-CM

## 2018-04-24 DIAGNOSIS — J45909 Unspecified asthma, uncomplicated: Principal | ICD-10-CM

## 2018-04-24 DIAGNOSIS — M1990 Unspecified osteoarthritis, unspecified site: Principal | ICD-10-CM

## 2018-04-24 DIAGNOSIS — R569 Unspecified convulsions: Principal | ICD-10-CM

## 2018-04-24 DIAGNOSIS — B1920 Unspecified viral hepatitis C without hepatic coma: Principal | ICD-10-CM

## 2018-04-24 DIAGNOSIS — B159 Hepatitis A without hepatic coma: Principal | ICD-10-CM

## 2018-04-27 MED FILL — METHOTREXATE SODIUM 25 MG/ML INJECTION SOLUTION: 28 days supply | Qty: 4 | Fill #2

## 2018-04-27 MED FILL — METHOTREXATE SODIUM 25 MG/ML INJECTION SOLUTION: 28 days supply | Qty: 4 | Fill #2 | Status: AC

## 2018-05-08 ENCOUNTER — Ambulatory Visit
Admit: 2018-05-08 | Discharge: 2018-05-09 | Payer: MEDICARE | Attending: Orthopaedic Surgery | Primary: Orthopaedic Surgery

## 2018-05-08 ENCOUNTER — Institutional Professional Consult (permissible substitution): Admit: 2018-05-08 | Discharge: 2018-05-09

## 2018-05-08 ENCOUNTER — Encounter: Admit: 2018-05-08 | Discharge: 2018-05-09 | Payer: MEDICARE

## 2018-05-08 DIAGNOSIS — I4891 Unspecified atrial fibrillation: Principal | ICD-10-CM

## 2018-05-08 DIAGNOSIS — M79602 Pain in left arm: Secondary | ICD-10-CM

## 2018-05-08 DIAGNOSIS — G479 Sleep disorder, unspecified: Principal | ICD-10-CM

## 2018-05-08 DIAGNOSIS — G4733 Obstructive sleep apnea (adult) (pediatric): Principal | ICD-10-CM

## 2018-05-08 DIAGNOSIS — M069 Rheumatoid arthritis, unspecified: Principal | ICD-10-CM

## 2018-05-08 DIAGNOSIS — B1920 Unspecified viral hepatitis C without hepatic coma: Principal | ICD-10-CM

## 2018-05-08 DIAGNOSIS — M1990 Unspecified osteoarthritis, unspecified site: Principal | ICD-10-CM

## 2018-05-08 DIAGNOSIS — M4802 Spinal stenosis, cervical region: Principal | ICD-10-CM

## 2018-05-08 DIAGNOSIS — M5412 Radiculopathy, cervical region: Principal | ICD-10-CM

## 2018-05-08 DIAGNOSIS — I10 Essential (primary) hypertension: Principal | ICD-10-CM

## 2018-05-08 DIAGNOSIS — E785 Hyperlipidemia, unspecified: Principal | ICD-10-CM

## 2018-05-08 DIAGNOSIS — S4990XA Unspecified injury of shoulder and upper arm, unspecified arm, initial encounter: Principal | ICD-10-CM

## 2018-05-08 DIAGNOSIS — Z981 Arthrodesis status: Principal | ICD-10-CM

## 2018-05-08 DIAGNOSIS — F329 Major depressive disorder, single episode, unspecified: Principal | ICD-10-CM

## 2018-05-08 DIAGNOSIS — M79601 Pain in right arm: Principal | ICD-10-CM

## 2018-05-08 DIAGNOSIS — J984 Other disorders of lung: Principal | ICD-10-CM

## 2018-05-08 DIAGNOSIS — J40 Bronchitis, not specified as acute or chronic: Principal | ICD-10-CM

## 2018-05-08 DIAGNOSIS — F319 Bipolar disorder, unspecified: Principal | ICD-10-CM

## 2018-05-08 DIAGNOSIS — G959 Disease of spinal cord, unspecified: Principal | ICD-10-CM

## 2018-05-08 DIAGNOSIS — F1910 Other psychoactive substance abuse, uncomplicated: Principal | ICD-10-CM

## 2018-05-08 DIAGNOSIS — M2550 Pain in unspecified joint: Principal | ICD-10-CM

## 2018-05-08 DIAGNOSIS — R569 Unspecified convulsions: Principal | ICD-10-CM

## 2018-05-08 DIAGNOSIS — F1020 Alcohol dependence, uncomplicated: Principal | ICD-10-CM

## 2018-05-08 DIAGNOSIS — B159 Hepatitis A without hepatic coma: Principal | ICD-10-CM

## 2018-05-08 DIAGNOSIS — F419 Anxiety disorder, unspecified: Principal | ICD-10-CM

## 2018-05-08 DIAGNOSIS — R51 Headache: Principal | ICD-10-CM

## 2018-05-08 DIAGNOSIS — J45909 Unspecified asthma, uncomplicated: Principal | ICD-10-CM

## 2018-05-08 MED ORDER — DULOXETINE 20 MG CAPSULE,DELAYED RELEASE
ORAL_CAPSULE | Freq: Every evening | ORAL | 2 refills | 0 days | Status: CP
Start: 2018-05-08 — End: 2018-08-06
  Filled 2018-05-13: qty 30, 30d supply, fill #0

## 2018-05-08 NOTE — Unmapped (Addendum)
?? Your diagnosis: The primary encounter diagnosis was S/P cervical spinal fusion. Diagnoses of Cervical radiculopathy, Pain in both upper extremities, Spinal stenosis in cervical region, and Cervical myelopathy (CMS-HCC) were also pertinent to this visit.    ?? Recommendations/Plan  ?? Medications: Take over-the-counter medications as needed and as tolerated  ?? If you don't have liver disease, the safest medication for pain is acetaminophen Extended Release (Tylenol Arthritis).  Take 650mg  (1 extended release tab) at a time for pain relief every 8 hours.  Do not take more than 3000mg  per day.   ?? We have prescribed a trial of low dose Duloxetine (Cymbalta).  Take one tablet (20mg ) once a day before bed.  Be mindful for side effects, to include seizures and serotonin syndrome, as discussed today.  ?? Activities as tolerated.  Falls prevention as discussed.  ?? Imaging studies: CT of the Cervical spine was ordered. Medical necessity: Radiographs have been performed/ordered. 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  Return in about 2 weeks (around 05/22/2018) for medication therapy evaluation and CT review.    ?? Avoid nicotine/tobacco  ?? Minimize alcohol intake  ?? Maintain a healthy weight    ?? Stay physically active and exercise regularly as tolerated ([email or web address])  ?? Maintain good sleeping habits    ?? We want to improve, and you can help.  You may receive a survey asking you about your visit.  Please take a few minutes to complete the survey.  We will use your feedback to make improvements.    ?? Contact our team electronically via MyChart message to [facility] Clinical Staff.    ?? Contact our team at [phone] or [phone] with any questions/concerns.  Patient Education        duloxetine  Pronunciation:  du LOX e teen  Brand:  Cymbalta, Gonzella Lex  What is the most important information I should know about duloxetine?  Do not take duloxetine within 5 days before or 14 days after you have used an MAO inhibitor, such as isocarboxazid, linezolid, methylene blue injection, phenelzine, rasagiline, selegiline, or tranylcypromine.  Some young people have thoughts about suicide when first taking an antidepressant. Stay alert to changes in your mood or symptoms. Report any new or worsening symptoms to your doctor.  Do not stop using duloxetine without first talking to your doctor.   What is duloxetine?  Duloxetine is a selective serotonin and norepinephrine reuptake inhibitor antidepressant (SSNRI).  Duloxetine is used to treat major depressive disorder in adults. Duloxetine is also used to treat general anxiety disorder in adults and children who are at least 83 years old.  Duloxetine is also used in adults to treat fibromyalgia (a chronic pain disorder), or chronic muscle or joint pain (such as low back pain and osteoarthritis pain).  Duloxetine is also used to treat pain caused by nerve damage in adults with diabetes (diabetic neuropathy).  Duloxetine may also be used for purposes not listed in this medication guide.  What should I discuss with my healthcare provider before taking duloxetine?  You should not use duloxetine if you are allergic to it.  Do not take duloxetine within 5 days before or 14 days after you have used an MAO inhibitor, such as isocarboxazid, linezolid, methylene blue injection, phenelzine, rasagiline, selegiline, or tranylcypromine. A dangerous drug interaction could occur.  Be sure your doctor knows if you also take stimulant medicine, opioid medicine, herbal products, or medicine for depression, mental illness, Parkinson's disease, migraine  headaches, serious infections, or prevention of nausea and vomiting. These medicines may interact with duloxetine and cause a serious condition called serotonin syndrome.  Duloxetine is not approved for use by anyone younger than 49 years old.  Tell your doctor if you have ever had:  liver or kidney disease;  slow digestion;  a seizure;  bleeding problems;  narrow-angle glaucoma;  bipolar disorder (manic depression); or  drug addiction or suicidal thoughts.  Some young people have thoughts about suicide when first taking an antidepressant. Your doctor should check your progress at regular visits. Your family or other caregivers should also be alert to changes in your mood or symptoms.  Taking duloxetine during pregnancy may cause breathing problems, feeding problems, seizures, or other complications in the newborn baby. However, you may have a relapse of depression if you stop taking this medicine. Tell your doctor right away if you become pregnant. Do not start or stop taking this medicine without your doctor's advice.  If you are pregnant, your name may be listed on a pregnancy registry to track the effects of duloxetine on the baby.  It may not be safe to breast-feed while using this medicine. Ask your doctor about any risk.  How should I take duloxetine?  Follow all directions on your prescription label and read all medication guides or instruction sheets. Your doctor may occasionally change your dose. Use the medicine exactly as directed.  Taking duloxetine in higher doses or more often than prescribed will not make it more effective, and may increase side effects.   Swallow the capsule whole and do not crush, chew, break, or open it.  You may take duloxetine with or without food.  It may take up to 4 weeks before your symptoms improve. Keep using the medication as directed and tell your doctor if your symptoms do not improve.  Your blood pressure will need to be checked often.  Do not stop using duloxetine suddenly, or you could have unpleasant symptoms (such as dizziness, nausea, diarrhea, irritability, or anxiety). Ask your doctor how to safely stop using this medicine.  Store at room temperature away from moisture and heat.  What happens if I miss a dose?  Take the medicine as soon as you can, but skip the missed dose if it is almost time for your next dose. Do not take two doses at one time.  What happens if I overdose?  Seek emergency medical attention or call the Poison Help line at [phone].  Overdose symptoms may include severe drowsiness, seizures, fast heartbeats, fainting, or coma.  What should I avoid while taking duloxetine?  Avoid driving or hazardous activity until you know how this medicine will affect you. Your reactions could be impaired. Avoid getting up too fast from a sitting or lying position, or you may feel dizzy. Dizziness or fainting can cause falls, accidents, or severe injuries.  Avoid drinking alcohol. It may increase your risk of liver damage.  Ask your doctor before taking a nonsteroidal anti-inflammatory drug (NSAID) such as aspirin, ibuprofen (Advil, Motrin), naproxen (Aleve), celecoxib (Celebrex), diclofenac, indomethacin, meloxicam, and others. Using an NSAID with duloxetine may cause you to bruise or bleed easily.  What are the possible side effects of duloxetine?  Get emergency medical help if you have signs of an allergic reaction (hives, difficult breathing, swelling in your face or throat) or a severe skin reaction (fever, sore throat, burning eyes, skin pain, red or purple skin rash with blistering and peeling).  Report any new  or worsening symptoms to your doctor, such as: mood or behavior changes, anxiety, panic attacks, trouble sleeping, or if you feel impulsive, irritable, agitated, hostile, aggressive, restless, hyperactive (mentally or physically), more depressed, or have thoughts about suicide or hurting yourself.  Call your doctor at once if you have:  pounding heartbeats or fluttering in your chest;  a light-headed feeling, like you might pass out;  easy bruising, unusual bleeding;  vision changes;  painful or difficult urination;  impotence, sexual problems;  liver problems --right-sided upper stomach pain, itching, dark urine, jaundice (yellowing of the skin or eyes);  low levels of sodium in the body --headache, confusion, slurred speech, severe weakness, vomiting, loss of coordination, feeling unsteady; or  a manic episode --racing thoughts, increased energy, reckless behavior, feeling extremely happy or irritable, talking more than usual, severe problems with sleep.  Seek medical attention right away if you have symptoms of serotonin syndrome, such as: agitation, hallucinations, fever, sweating, shivering, fast heart rate, muscle stiffness, twitching, loss of coordination, nausea, vomiting, or diarrhea.  Common side effects may include:  drowsiness;  nausea, constipation, loss of appetite;  dry mouth; or  increased sweating.  This is not a complete list of side effects and others may occur. Call your doctor for medical advice about side effects. You may report side effects to FDA at 1-800-FDA-1088.  What other drugs will affect duloxetine?  Sometimes it is not safe to use certain medications at the same time. Some drugs can affect your blood levels of other drugs you take, which may increase side effects or make the medications less effective.  Many drugs can affect duloxetine. This includes prescription and over-the-counter medicines, vitamins, and herbal products. Not all possible interactions are listed here. Tell your doctor about all your current medicines and any medicine you start or stop using.  Where can I get more information?  Your pharmacist can provide more information about duloxetine.  Remember, keep this and all other medicines out of the reach of children, never share your medicines with others, and use this medication only for the indication prescribed.  Every effort has been made to ensure that the information provided by [employer] ('[employer]') is accurate, up-to-date, and complete, but no guarantee is made to that effect. Drug information contained herein may be time sensitive. [employer] information has been compiled for use by healthcare practitioners and consumers in the [place] and therefore [employer] does not warrant that uses outside of the [place] are appropriate, unless specifically indicated otherwise. [employer]'s drug information does not endorse drugs, diagnose patients or recommend therapy. [employer]'s drug information is an Investment [employer], corporate to assist licensed healthcare practitioners in caring for their patients and/or to serve consumers viewing this service as a supplement to, and not a substitute for, the expertise, skill, knowledge and judgment of healthcare practitioners. The absence of a warning for a given drug or drug combination in no way should be construed to indicate that the drug or drug combination is safe, effective or appropriate for any given patient. [employer] does not assume any responsibility for any aspect of healthcare administered with the aid of information [employer] provides. The information contained herein is not intended to cover all possible uses, directions, precautions, warnings, drug interactions, allergic reactions, or adverse effects. If you have questions about the drugs you are taking, check with your doctor, nurse or pharmacist.  Copyright [phone] [employer] Version: 12.01. Revision date: 02/06/2017.  Care instructions adapted under license by [facility]  Care. If you have questions about a medical condition or this instruction, always ask your healthcare professional. [employer] disclaims any warranty or liability for your use of this information.     The primary encounter diagnosis was S/P cervical spinal fusion. Diagnoses of Cervical radiculopathy, Pain in both upper extremities, Spinal stenosis in cervical region, and Cervical myelopathy (CMS-HCC) were also pertinent to this visit.

## 2018-05-08 NOTE — Unmapped (Signed)
03/27 CT scan and in clinic follow up scheduled for 04/15, patient informed of date and times and location.

## 2018-05-08 NOTE — Unmapped (Signed)
Joseph Sandoval. Joseph Sandoval  [birth year]  [id number]  05/08/2018     Telephone Encounter    Provider attests that he is currently located in the [place] of [place]; Patient confirms to be located in the [place] of [place] as well.     Patient agrees to telephone visit.    Assessment/Plan:   Joseph Sandoval  48 y.o. male  [id number] Joseph Sandoval  Former contruction. Recov EtOH/66yrs. Applying for disability.  Neuropathy. Afib. COPD. OSA. Bipolar. Tob. RA. HepC.  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 No c-radic.  S/p C5-7 ACDF (Monday 03/31/17 at [facility])  XR-C 03/2018 - C7 fractured screw, protruding 1-39mm.    C7 screw appears to have broken with some loosening.  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  We will further evaluate with CT Scan of the cervical spine.  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  In regards to symptoms management, we discussed multiple options for his nerve pain, and after a thorough review of options/risks/benefits, the patient has elected to trial low-dose Duloxetine at this time.  He was educated on potential interactions with current medications, and s/s of adverse effects to be aware of.      ?? Medications: Take over-the-counter medications as needed and as tolerated  ?? If you don't have liver disease, the safest medication for pain is acetaminophen Extended Release (Tylenol Arthritis).  Take 650mg  (1 extended release tab) at a time for pain relief every 8 hours.  Do not take more than 3000mg  per day.   ?? We have prescribed a trial of low dose Duloxetine (Cymbalta).  Take one tablet (20mg ) once a day before bed.  Be mindful for side effects, to include seizures and serotonin syndrome, as discussed today.  ?? Activities as tolerated.  Falls prevention as discussed.  ?? Imaging studies: CT of the Cervical spine was ordered. Medical necessity: Radiographs have been performed/ordered. 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  Return in about 2 weeks (around 05/22/2018) for medication therapy evaluation and CT review.    Subjective     Chief Complaint: Joseph Sandoval. Joseph Sandoval presents for this telephone encounter s/p Cervical Fusion??C5-7 ACDF 03/31/17    History of Present Illness:        05/08/18 1347   PainSc:   7     Patient is 1 year status post C5-7 ACDF.  He was last seen on 03/17/2018 by Dr. 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  Since last seen, he reports symptoms have continued to worsen.   He reports paraesthesias in BUEs, worse in bilateral hands (L>R numbness/pain/weakness) that is in fingers 1-4 bilaterally.    He does report sensation of dysphagia, that he feels has worsened over the past couple months.  He reports tingling sensation in his throat, and occasional food feeling stuck in throat when eating, he denies any chocking or difficulty with fluids.    He is ambulating with a cane, has difficulty with stairs, does report urinary incontinence (~2-3x per month) and increased urinary urgency.  He denies any accidents/falls    Current treatments: Acetominophen 1000mg  BID or TID.  Additional therapy: Risperidone and Wellbutrin (cigeratte sensation, will be d/c'n soon per patient).   Prior treatments: Gabapentin 400mg  TID (no effect), Lyrica (  minimum effect)    Review of Systems:  Fever/chills: denies  Bowel/bladder changes: denies (other then discussed as above).    Past Medical History:   Diagnosis Date   ??? Alcoholism (CMS-HCC)    ??? Anxiety    ??? Arthritis    ??? Asthma    ??? Atrial fibrillation (CMS-HCC)    ??? Bipolar affective (CMS-HCC)    ??? Bronchitis    ??? Depression    ??? Headache    ??? Hepatitis C    ??? Hyperlipidemia    ??? Hypertension    ??? Infectious viral hepatitis    ??? Joint pain    ??? Lung disease    ??? Polysubstance abuse (CMS-HCC)    ??? RA (rheumatoid arthritis) (CMS-HCC)    ??? Seizures (CMS-HCC)    ??? Shoulder injury    ??? Sleep apnea, obstructive    ??? Sleeping difficulties         Family History   Problem Relation Age of Onset   ??? Heart attack Mother    ??? Hypertension Mother    ??? Arthritis Mother    ??? Cancer Mother    ??? Heart disease Mother    ??? Hypertension Father    ??? Arthritis Father    ??? Heart disease Father    ??? Arthritis Sister    ??? Migraines Sister    ??? Fibromyalgia Paternal Aunt        Social History     Socioeconomic History   ??? Marital status: Single     Spouse name: Not on file   ??? Number of children: Not on file   ??? Years of education: Not on file   ??? Highest education level: Not on file   Occupational History   ??? Not on file   Social Needs   ??? Financial resource strain: Not on file   ??? Food insecurity     Worry: Not on file     Inability: Not on file   ??? Transportation needs     Medical: Not on file     Non-medical: Not on file   Tobacco Use   ??? Smoking status: Current Every Day Smoker     Packs/day: 1.00     Years: 30.00     Pack years: 30.00     Types: Cigarettes     Start date: 05/19/1984   ??? Smokeless tobacco: Former Neurosurgeon     Types: Snuff   Substance and Sexual Activity   ??? Alcohol use: No     Alcohol/week: 0.0 standard drinks     Comment: recovering   ??? Drug use: No   ??? Sexual activity: Not on file   Lifestyle   ??? Physical activity     Days per week: Not on file     Minutes per session: Not on file   ??? Stress: Not on file   Relationships   ??? Social Wellsite geologist on phone: Not on file     Gets together: Not on file     Attends religious service: Not on file     Active member of club or organization: Not on file     Attends meetings of clubs or organizations: Not on file     Relationship status: Not on file   Other Topics Concern   ??? Exercise Not Asked   ??? Living Situation Not Asked   Social History Narrative   ??? Not on file  Current Outpatient Medications:   ???  acetaminophen (TYLENOL) 500 MG tablet, Take 1,000 mg by mouth every six (6) hours as needed for pain., Disp: , Rfl:   ???  adalimumab (HUMIRA) 40 mg/0.8 mL injection, Inject 0.8 mL (40 mg total) under the skin every seven (7) days. Dose change to weekly, Disp: 12 each, Rfl: 3  ???  albuterol HFA 90 mcg/actuation inhaler, INHALE 2 PUFFS EVERY SIX (6) HOURS AS NEEDED FOR WHEEZING., Disp: 20.1 g, Rfl: 6  ???  albuterol HFA 90 mcg/actuation inhaler, INHALE 2 PUFFS EVERY SIX (6) HOURS AS NEEDED FOR WHEEZING., Disp: 20.1 g, Rfl: 3  ???  amLODIPine (NORVASC) 5 MG tablet, Take 10 mg by mouth daily. , Disp: , Rfl:   ???  benztropine (COGENTIN) 0.5 MG tablet, Take 0.5 mg by mouth Two (2) times a day., Disp: , Rfl:   ???  buPROPion (WELLBUTRIN XL) 150 MG 24 hr tablet, Take 150 mg by mouth daily., Disp: , Rfl:   ???  gabapentin (NEURONTIN) 400 MG capsule, Take 400 mg by mouth Three (3) times a day., Disp: , Rfl:   ???  hydroCHLOROthiazide (HYDRODIURIL) 25 MG tablet, Take 25 mg by mouth daily., Disp: , Rfl: 4  ???  methotrexate 25 mg/mL injection solution, INJECT 1 ML (25 MG TOTAL) UNDER THE SKIN ONCE A WEEK., Disp: 4 mL, Rfl: 2  ???  metoprolol tartrate (LOPRESSOR) 25 MG tablet, Take 50 mg by mouth Two (2) times a day. , Disp: , Rfl:   ???  mometasone-formoterol 200-5 mcg/actuation HFAA, INHALE 2 PUFFS TWO (2) TIMES A DAY., Disp: 39 g, Rfl: 6  ???  mometasone-formoterol 200-5 mcg/actuation HFAA, INHALE 2 PUFFS TWO (2) TIMES A DAY., Disp: 39 g, Rfl: 3  ???  ranitidine (ZANTAC) 150 MG tablet, Take 150 mg by mouth Two (2) times a day., Disp: , Rfl:   ???  risperiDONE (RISPERDAL) 2 MG tablet, Take 2 mg by mouth nightly., Disp: , Rfl:   ???  sertraline (ZOLOFT) 50 MG tablet, Take 100 mg by mouth daily. , Disp: , Rfl: 1  ???  simvastatin (ZOCOR) 20 MG tablet, Take 40 mg by mouth nightly. , Disp: , Rfl:   ???  syringe with needle 1 mL 25 gauge x 5/8 Syrg, Use as directed to inject methotrexate, Disp: 12 each, Rfl: PRN     MEDICAL DECISION MAKING  ??  Test Results:  Imaging:   C-spine XR. [place]. Date: 03/17/2018.  Status post C5 7 ACDF with concern for failure of C7 screw and possible pseudoarthrosis at C6-7     I, Duke Salvia, NP, personally interpreted the images.  The available reports were reviewed.     Labs:    WBC   Date Value Ref Range Status   04/13/2018 9.5 4.5 - 11.0 10*9/L Final     HGB   Date Value Ref Range Status   04/13/2018 17.0 13.5 - 17.5 g/dL Final     HCT   Date Value Ref Range Status   04/13/2018 50.3 41.0 - 53.0 % Final     Platelet   Date Value Ref Range Status   04/13/2018 236 150 - 440 10*9/L Final     Creatinine   Date Value Ref Range Status   04/13/2018 0.78 0.70 - 1.30 mg/dL Final     AST   Date Value Ref Range Status   04/13/2018 38 17 - 47 U/L Final     AFP-Tumor Marker   Date Value Ref Range  Status   08/25/2014 2.04 <7.51 ng/mL Final       Follow up: Joseph Sandoval. [place] will be seen for follow-up visit in 2-3 weeks and will call or message provider should anything change or any new issues arise.    22 min was spent on this medically necessary service. Pt visit by phone in the setting of State of Emergency due to COVID-19 Pandemic.     mJOA SCORE   Score   UPPER EXTREMITY MOTOR SUBSCORE (/5) 3 (Description: Able to button a shirt with great difficulty)   LOWER EXTREMITY SUBSCORE (/7) 4 (Description: Able to walk without walking aid, but must hold a handrail on stairs)   UPPER EXTREMITY SENSORY SUBSCORE (/3) 1 (Description: Severe loss of hand sensation OR pain)   URINARY FUNCTION SUBSCORE (/3) 2 (Description: Urinary urgency OR occasional stress incontinence - less than once per month)      TOTAL SCORE (05/08/2018): 10   PREVIOUS SCORE: NA

## 2018-05-13 MED FILL — DULOXETINE 20 MG CAPSULE,DELAYED RELEASE: 30 days supply | Qty: 30 | Fill #0 | Status: AC

## 2018-05-19 NOTE — Unmapped (Signed)
[facility] Refill Coordination Note    Specialty Medication(s) to be Shipped:   Inflammatory Disorders: methotrexate (injectable)    Other medication(s) to be shipped: n/a     Joseph Sandoval, DOB: 21-Mar-1970  Phone: [phone] (home) [phone] (work)      All above HIPAA information was verified with patient.     Completed refill call assessment today to schedule patient's medication shipment from the [facility] [phone]).       Specialty medication(s) and dose(s) confirmed: Regimen is correct and unchanged.   Changes to medications: Joseph Sandoval reports no changes reported at this time.  Changes to insurance: No  Questions for the pharmacist: No    Confirmed patient received Welcome Packet with first shipment. The patient will receive a drug information handout for each medication shipped and additional FDA Medication Guides as required.       DISEASE/MEDICATION-SPECIFIC INFORMATION        For patients on injectable medications: Patient currently has 1 doses left.  Next injection is scheduled for n/a.    SPECIALTY MEDICATION ADHERENCE     Medication Adherence    Patient reported X missed doses in the last month:  0  Specialty Medication:  methotrexate  Patient is on additional specialty medications:  No  Patient is on more than two specialty medications:  No  Any gaps in refill history greater than 2 weeks in the last 3 months:  no  Demonstrates understanding of importance of adherence:  yes  Informant:  patient  Adherence tools used:  calendar                Methotrexate 25mg /ml: Patient has 7 days of medication on hand      SHIPPING     Shipping address confirmed in Epic.     Delivery Scheduled: Yes, Expected medication delivery date: 05/22/18.  However, Rx request for refills was sent to the provider as there are none remaining.     Medication will be delivered via UPS to the home address in Epic WAM.    Olga Millers   [facility] Specialty Technician

## 2018-05-25 MED ORDER — METHOTREXATE SODIUM 25 MG/ML INJECTION SOLUTION
2 refills | 0 days | Status: CP
Start: 2018-05-25 — End: 2018-05-26

## 2018-05-25 NOTE — Unmapped (Signed)
Refill request needs to be routed to the primary prescriber.

## 2018-05-26 MED ORDER — SYRINGE WITH NEEDLE 1 ML 25 GAUGE X 5/8"
PRN refills | 0 days | Status: CP
Start: 2018-05-26 — End: ?

## 2018-05-26 MED ORDER — METHOTREXATE SODIUM 25 MG/ML INJECTION SOLUTION
2 refills | 0 days | Status: CP
Start: 2018-05-26 — End: 2018-07-15
  Filled 2018-05-26: qty 4, 28d supply, fill #0

## 2018-05-26 MED FILL — METHOTREXATE SODIUM 25 MG/ML INJECTION SOLUTION: 28 days supply | Qty: 4 | Fill #0 | Status: AC

## 2018-05-26 MED FILL — BD TUBERCULIN SYRINGE 1 ML 25 GAUGE X 5/8": 84 days supply | Qty: 12 | Fill #0

## 2018-05-26 MED FILL — BD TUBERCULIN SYRINGE 1 ML 25 GAUGE X 5/8": 84 days supply | Qty: 12 | Fill #0 | Status: AC

## 2018-05-26 NOTE — Unmapped (Signed)
[name] 's Methotrexate shipment will be delayed due to No refills We have contacted the patient and communicated the delivery change to patient/caregiver We will reschedule the medication for the delivery date that the patient agreed upon. We have confirmed the delivery date as 05/27/2018 .

## 2018-05-26 NOTE — Unmapped (Signed)
syringe refill  Last ov: Visit date not found  Next ov: 09/07/2018

## 2018-05-27 ENCOUNTER — Encounter: Admit: 2018-05-27 | Discharge: 2018-05-27 | Payer: MEDICARE

## 2018-05-27 DIAGNOSIS — G959 Disease of spinal cord, unspecified: Secondary | ICD-10-CM

## 2018-05-27 DIAGNOSIS — M5412 Radiculopathy, cervical region: Secondary | ICD-10-CM

## 2018-05-27 DIAGNOSIS — Z981 Arthrodesis status: Principal | ICD-10-CM

## 2018-05-27 DIAGNOSIS — M4802 Spinal stenosis, cervical region: Secondary | ICD-10-CM

## 2018-05-28 ENCOUNTER — Institutional Professional Consult (permissible substitution): Admit: 2018-05-28 | Discharge: 2018-05-29

## 2018-05-28 DIAGNOSIS — Z981 Arthrodesis status: Principal | ICD-10-CM

## 2018-05-28 DIAGNOSIS — M5412 Radiculopathy, cervical region: Secondary | ICD-10-CM

## 2018-05-28 MED ORDER — DICLOFENAC 1 % TOPICAL GEL
Freq: Four times a day (QID) | TOPICAL | 1 refills | 0 days | Status: CP
Start: 2018-05-28 — End: 2018-11-24
  Filled 2018-05-28: qty 800, 100d supply, fill #0

## 2018-05-28 MED FILL — DICLOFENAC 1 % TOPICAL GEL: 100 days supply | Qty: 800 | Fill #0 | Status: AC

## 2018-05-28 NOTE — Unmapped (Signed)
Joseph Sandoval. Vancleve  1971-01-31  [id number]  05/28/2018     Telephone Encounter    Provider attests that he is currently located in the [place]; Patient confirms to be located in the [place] of [place] as well.     Patient agrees to telephone visit.    18 min was spent on this medically necessary service. Pt visit by phone in the setting of State of Emergency due to COVID-19 Pandemic.   Assessment/Plan:   Joseph Sandoval  48 y.o. 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  Neuropathy. Afib. COPD. OSA. Bipolar. Tob. RA. HepC.  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 No c-radic.  S/p C5-7 ACDF (Monday 03/31/17 at [facility])  XR-C 03/2018 - C7 fractured screw, protruding 1-67mm.  CT-C 05/2018 - fracture of the left C7 screw with minimal backing out of the screw head. No ankylosis evident at C6-C7    CT C-spine demonstrated C7 screw fracture protruding 2mm with no ankylosis evident at C6-C7 .  Patient does report improvement since starting low dose trial of Duloxetine at this time, care coordinated with his psychiatrist who will be taking over Duloxetine management and will increasing the dose to 30mg  QHS.  We will continue to optimize conservative treatment with Voltaren Gel and OTC soft cervical collar as needed for symptom management until he can be evaluated by Dr. Jaynie Sandoval to discuss surgical options after COVID-19 pandemic subsides.    ?? Medications: Take over-the-counter medications as needed and as tolerated  ?? If you don't have liver disease, the safest medication for pain is acetaminophen Extended Release (Tylenol Arthritis).  Take 650mg  (1 extended release tab) at a time for pain relief every 8 hours.  Do not take more than 3000mg  per day.   ?? We have prescribed Voltaren Gel.  Apply as needed as prescribed  ?? Continue Duloxetine (Cymbalta).  Take as prescribed by your psychiatrist   ?? We have recommended an over the counter soft cervical collar to wear as needed for pain relief.  ?? Activities as tolerated.  Falls prevention as discussed.    ?? Imaging studies: None  Return in about 6 weeks (around 07/09/2018) for recheck phone visit.    Subjective     Chief Complaint: Joseph Sandoval. Joseph Sandoval presents for this telephone encounter s/p Cervical Fusion??C5-7 ACDF 03/31/17    History of Present Illness:   Patient is 1 year status post C5-7 ACDF.  He was last seen on 03/17/2018 by Dr. 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  Since last seen, he reports symptoms have continued to worsen.   He reports paraesthesias in BUEs, worse in bilateral hands (L>R numbness/pain/weakness) that is in fingers 1-4 bilaterally.    He does report sensation of dysphagia, that he feels has worsened over the past couple months.  He reports tingling sensation in his throat, and occasional food feeling stuck in throat when eating, he denies any chocking or difficulty with fluids.    He is ambulating with a cane, has difficulty with stairs, does report urinary incontinence (~2-3x per month) and increased urinary urgency.  He denies any accidents/falls    Interval History:       05/28/18 1258   PainSc:   5  Phone visit to review CT scan and f/u on medication management.   Since last phone visit, he does report an improvement in his pain since starting Duloxetine 20mg  QHS (improving pain from 7/10 to 5/10).  His psychiatrist will be assuming management of the duloxetine and will be increasing his Duloxetine to 30mg  QHS starting today.  He has been using a wrist brace for his right hand which he states has provided some relief.  Continued severe cervical spine pain with any range of motion.      Current treatments: Acetominophen 1000mg  BID or TID, Duloxetine 20mg  QHS.  Additional therapy: Wellbutrin (cigeratte sensation, will be d/c'n soon per patient).   Prior treatments: Gabapentin 400mg  TID (no effect), Lyrica (minimum effect)    Review of Systems:  Fever/chills: denies  Bowel/bladder changes: denies (other then discussed as above).    Past Medical History:   Diagnosis Date   ??? Alcoholism (CMS-HCC)    ??? Anxiety    ??? Arthritis    ??? Asthma    ??? Atrial fibrillation (CMS-HCC)    ??? Bipolar affective (CMS-HCC)    ??? Bronchitis    ??? Depression    ??? Headache    ??? Hepatitis C    ??? Hyperlipidemia    ??? Hypertension    ??? Infectious viral hepatitis    ??? Joint pain    ??? Lung disease    ??? Polysubstance abuse (CMS-HCC)    ??? RA (rheumatoid arthritis) (CMS-HCC)    ??? Seizures (CMS-HCC)    ??? Shoulder injury    ??? Sleep apnea, obstructive    ??? Sleeping difficulties         Family History   Problem Relation Age of Onset   ??? Heart attack Mother    ??? Hypertension Mother    ??? Arthritis Mother    ??? Cancer Mother    ??? Heart disease Mother    ??? Hypertension Father    ??? Arthritis Father    ??? Heart disease Father    ??? Arthritis Sister    ??? Migraines Sister    ??? Fibromyalgia Paternal Aunt        Social History     Socioeconomic History   ??? Marital status: Single     Spouse name: Not on file   ??? Number of children: Not on file   ??? Years of education: Not on file   ??? Highest education level: Not on file   Occupational History   ??? Not on file   Social Needs   ??? Financial resource strain: Not on file   ??? Food insecurity     Worry: Not on file     Inability: Not on file   ??? Transportation needs     Medical: Not on file     Non-medical: Not on file   Tobacco Use   ??? Smoking status: Current Every Day Smoker     Packs/day: 1.00     Years: 30.00     Pack years: 30.00     Types: Cigarettes     Start date: 05/19/1984   ??? Smokeless tobacco: Former Neurosurgeon     Types: Snuff   Substance and Sexual Activity   ??? Alcohol use: No     Alcohol/week: 0.0 standard drinks     Comment: recovering   ??? Drug use: No   ??? Sexual activity: Not on file   Lifestyle   ??? Physical activity     Days per week: Not  on file     Minutes per session: Not on file   ??? Stress: Not on file   Relationships   ??? Social Wellsite geologist on phone: Not on file     Gets together: Not on file     Attends religious service: Not on file     Active member of club or organization: Not on file     Attends meetings of clubs or organizations: Not on file     Relationship status: Not on file   Other Topics Concern   ??? Exercise Not Asked   ??? Living Situation Not Asked   Social History Narrative   ??? Not on file          Current Outpatient Medications:   ???  acetaminophen (TYLENOL) 500 MG tablet, Take 1,000 mg by mouth every six (6) hours as needed for pain., Disp: , Rfl:   ???  adalimumab (HUMIRA) 40 mg/0.8 mL injection, Inject 0.8 mL (40 mg total) under the skin every seven (7) days. Dose change to weekly, Disp: 12 each, Rfl: 3  ???  albuterol HFA 90 mcg/actuation inhaler, INHALE 2 PUFFS EVERY SIX (6) HOURS AS NEEDED FOR WHEEZING., Disp: 20.1 g, Rfl: 6  ???  albuterol HFA 90 mcg/actuation inhaler, INHALE 2 PUFFS EVERY SIX (6) HOURS AS NEEDED FOR WHEEZING., Disp: 20.1 g, Rfl: 3  ???  amLODIPine (NORVASC) 5 MG tablet, Take 10 mg by mouth daily. , Disp: , Rfl:   ???  benztropine (COGENTIN) 0.5 MG tablet, Take 0.5 mg by mouth Two (2) times a day., Disp: , Rfl:   ???  buPROPion (WELLBUTRIN XL) 150 MG 24 hr tablet, Take 150 mg by mouth daily., Disp: , Rfl:   ???  DULoxetine (CYMBALTA) 20 MG capsule, Take 1 capsule (20 mg total) by mouth nightly., Disp: 30 capsule, Rfl: 2  ???  hydroCHLOROthiazide (HYDRODIURIL) 25 MG tablet, Take 25 mg by mouth daily., Disp: , Rfl: 4  ???  methotrexate 25 mg/mL injection solution, INJECT 1 ML (25 MG TOTAL) UNDER THE SKIN ONCE A WEEK., Disp: 4 mL, Rfl: 2  ???  metoprolol tartrate (LOPRESSOR) 25 MG tablet, Take 50 mg by mouth Two (2) times a day. , Disp: , Rfl:   ???  mometasone-formoterol 200-5 mcg/actuation HFAA, INHALE 2 PUFFS TWO (2) TIMES A DAY., Disp: 39 g, Rfl: 6  ???  mometasone-formoterol 200-5 mcg/actuation HFAA, INHALE 2 PUFFS TWO (2) TIMES A DAY., Disp: 39 g, Rfl: 3  ???  ranitidine (ZANTAC) 150 MG tablet, Take 150 mg by mouth Two (2) times a day., Disp: , Rfl:   ???  risperiDONE (RISPERDAL) 2 MG tablet, Take 2 mg by mouth nightly., Disp: , Rfl:   ???  sertraline (ZOLOFT) 50 MG tablet, Take 100 mg by mouth daily. , Disp: , Rfl: 1  ???  simvastatin (ZOCOR) 20 MG tablet, Take 40 mg by mouth nightly. , Disp: , Rfl:   ???  syringe with needle 1 mL 25 gauge x 5/8 Syrg, Use as directed to inject methotrexate, Disp: 12 each, Rfl: PRN     MEDICAL DECISION MAKING  ??  Test Results:  Imaging:   ?? CT C-spine.  [place].  Date: 05/27/2018.  Prior C5-C7 ACDF with fracture of the left C7 screw with minimal backing out of the screw head. No ankylosis evident at C6-C7. Unchanged spondylosis compared to MRI from 08/22/2017.    ?? C-spine XR. . Date: 03/17/2018.  Status post C5 7 ACDF  with concern for failure of C7 screw and possible pseudoarthrosis at C6-7     I, Duke Salvia, NP, personally interpreted the images.  The available reports were reviewed.     Labs:    WBC   Date Value Ref Range Status   04/13/2018 9.5 4.5 - 11.0 10*9/L Final     HGB   Date Value Ref Range Status   04/13/2018 17.0 13.5 - 17.5 g/dL Final     HCT   Date Value Ref Range Status   04/13/2018 50.3 41.0 - 53.0 % Final     Platelet   Date Value Ref Range Status   04/13/2018 236 150 - 440 10*9/L Final     Creatinine   Date Value Ref Range Status   04/13/2018 0.78 0.70 - 1.30 mg/dL Final     AST   Date Value Ref Range Status   04/13/2018 38 17 - 47 U/L Final     AFP-Tumor Marker   Date Value Ref Range Status   08/25/2014 2.04 <7.51 ng/mL Final       I spent 18 minutes on the phone with the patient. I spent an additional 18 minutes on pre- and post-visit activities.     The patient was physically located in [place] or a state in which I am permitted to provide care. The patient understood that s/he may incur co-pays and cost sharing, and agreed to the telemedicine visit. The visit was completed via phone and/or video, which was appropriate and reasonable under the circumstances given the patient's presentation at the time.    The patient has been advised of the potential risks and limitations of this mode of treatment (including, but not limited to, the absence of in-person examination) and has agreed to be treated using telemedicine. The patient's/patient's family's questions regarding telemedicine have been answered.     If the phone/video visit was completed in an ambulatory setting, the patient has also been advised to contact their provider???s office for worsening conditions, and seek emergency medical treatment and/or call 911 if the patient deems either necessary.        mJOA SCORE   Score   UPPER EXTREMITY MOTOR SUBSCORE (/5) 3 (Description: Able to button a shirt with great difficulty)   LOWER EXTREMITY SUBSCORE (/7) 4 (Description: Able to walk without walking aid, but must hold a handrail on stairs)   UPPER EXTREMITY SENSORY SUBSCORE (/3) 1 (Description: Severe loss of hand sensation OR pain)   URINARY FUNCTION SUBSCORE (/3) 2 (Description: Urinary urgency OR occasional stress incontinence - less than once per month)      TOTAL SCORE (05/08/2018): 10   PREVIOUS SCORE: NA

## 2018-05-28 NOTE — Unmapped (Signed)
??   Your diagnosis: The primary encounter diagnosis was S/P cervical spinal fusion. A diagnosis of Cervical radiculopathy was also pertinent to this visit.    ?? Recommendations/Plan  ?? Medications: Take over-the-counter medications as needed and as tolerated  ?? If you don't have liver disease, the safest medication for pain is acetaminophen Extended Release (Tylenol Arthritis).  Take 650mg  (1 extended release tab) at a time for pain relief every 8 hours.  Do not take more than 3000mg  per day.   ?? We have prescribed Voltaren Gel.  Apply as needed as prescribed  ?? Continue Duloxetine (Cymbalta).  Take as prescribed by your psychiatrist   ?? We have recommended an over the counter soft cervical collar to wear as needed for pain relief.  ?? Activities as tolerated.  Falls prevention as discussed.   ?? Imaging studies: None  Return in about 6 weeks (around 07/09/2018) for recheck phone visit.    ?? Avoid nicotine/tobacco  ?? Minimize alcohol intake  ?? Maintain a healthy weight    ?? Stay physically active and exercise regularly as tolerated ([email or web address])  ?? Maintain good sleeping habits    ?? We want to improve, and you can help.  You may receive a survey asking you about your visit.  Please take a few minutes to complete the survey.  We will use your feedback to make improvements.    ?? Contact our team electronically via MyChart message to [facility] Clinical Staff.    ?? Contact our team at [phone] or [phone] with any questions/concerns.

## 2018-06-12 NOTE — Unmapped (Signed)
[facility] Refill Coordination Note    Specialty Medication(s) to be Shipped:   Inflammatory Disorders: methotrexate (injectable)    Other medication(s) to be shipped: n/a     [name], DOB: 1970/06/04  Phone: [phone] (home) [phone] (work)      All above HIPAA information was verified with patient.     Completed refill call assessment today to schedule patient's medication shipment from the [facility] [phone]).       Specialty medication(s) and dose(s) confirmed: Regimen is correct and unchanged.   Changes to medications: [name] reports no changes at this time.  Changes to insurance: No  Questions for the pharmacist: No    Confirmed patient received Welcome Packet with first shipment. The patient will receive a drug information handout for each medication shipped and additional FDA Medication Guides as required.       DISEASE/MEDICATION-SPECIFIC INFORMATION        For patients on injectable medications: Patient currently has 3 doses left.  Next injection is scheduled for 06/18/18.    SPECIALTY MEDICATION ADHERENCE     Medication Adherence    Patient reported X missed doses in the last month:  0  Specialty Medication:  methotrexate 25mg /ml  Patient is on additional specialty medications:  No  Patient is on more than two specialty medications:  No  Any gaps in refill history greater than 2 weeks in the last 3 months:  no  Demonstrates understanding of importance of adherence:  yes  Informant:  patient  Adherence tools used:  calendar                Methotrexate 25mg /ml: Patient has 21 days of medication on hand      SHIPPING     Shipping address confirmed in Epic.     Delivery Scheduled: Yes, Expected medication delivery date: 06/24/18.     Medication will be delivered via UPS to the home address in Epic WAM.    Olga Millers   [facility] Specialty Technician

## 2018-06-23 MED FILL — METHOTREXATE SODIUM 25 MG/ML INJECTION SOLUTION: 28 days supply | Qty: 10 | Fill #1 | Status: AC

## 2018-06-23 MED FILL — METHOTREXATE SODIUM 25 MG/ML INJECTION SOLUTION: 28 days supply | Qty: 10 | Fill #1

## 2018-07-09 ENCOUNTER — Institutional Professional Consult (permissible substitution): Admit: 2018-07-09 | Discharge: 2018-07-10

## 2018-07-09 NOTE — Unmapped (Signed)
Joseph Sandoval. Nations  1970/02/15  [id number]  07/09/2018     Telephone Encounter    Provider attests that he is currently located in the [place] of [place]; Patient confirms to be located in the [place] of [place] as well.     Patient agrees to telephone visit.    12 min was spent on this medically necessary service. Pt visit by phone in the setting of State of Emergency due to COVID-19 Pandemic.   Assessment/Plan:   Joseph Sandoval  48 y.o. male  [id number] Joseph Sandoval  Former contruction. Recov EtOH/40yrs. Applying for disability.  Neuropathy. Afib. COPD. OSA. Bipolar. Tob. RA. HepC.  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 No c-radic.  S/p C5-7 ACDF (Monday 03/31/17 at [facility])  XR-C 03/2018 - C7 fractured screw, protruding 1-61mm.  CT-C 05/2018 - fracture of the left C7 screw with minimal backing out of the screw head. No ankylosis evident at C6-C7    CT C-spine demonstrated C7 screw fracture protruding 2mm with no ankylosis evident at C6-C7 .  Patient does report improvement since starting Duloxetine, starting Voltaren Gel, and using OTC soft cervical collar as needed for symptom management.  At this time, patient will f/u with Dr. Jaynie Bufford to discuss additional treatment options.      PROM:  MJOA: 10    PLAN:  ?? Medications: Take over-the-counter medications as needed and as tolerated  ?? If you don't have liver disease, the safest medication for pain is acetaminophen Extended Release (Tylenol Arthritis).  Take 650mg  (1 extended release tab) at a time for pain relief every 8 hours.  Do not take more than 3000mg  per day.   ?? Continue to use Voltaren Gel.  Apply as needed as prescribed  ?? Continue Duloxetine (Cymbalta).  Take as prescribed by your psychiatrist   ?? Continue to use over the counter soft cervical collar to wear as needed for pain relief.  ?? Activities as tolerated.  Falls prevention as discussed.    ?? Imaging studies: None  Follow-up with Dr. Jaynie Woolston (Video visit - Patient educated on Doximity Video today and test video visit performed).    Subjective     Chief Complaint: Joseph Sandoval. [name] presents for this telephone encounter s/p Cervical Fusion??C5-7 ACDF 03/31/17    History of Present Illness:        07/09/18 1249   PainSc:   6   Patient is 1 year status post C5-7 ACDF.  He was last seen on 03/17/2018 by Dr. 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  Since last seen, he reports symptoms have continued to worsen.   He reports paraesthesias in BUEs, worse in bilateral hands (L>R numbness/pain/weakness) that is in fingers 1-4 bilaterally.    He does report sensation of dysphagia, that he feels has worsened over the past couple months.  He reports tingling sensation in his throat, and occasional food feeling stuck in throat when eating, he denies any chocking or difficulty with fluids.    He is ambulating with a cane, has difficulty with stairs, does report urinary incontinence (~2-3x per month) and increased urinary urgency.  He denies any accidents/falls.    CT C-spine demonstrated C7 screw fracture protruding 2mm with no ankylosis evident at C6-C7 .  Patient does report improvement since  starting low dose trial of Duloxetine at this time, care coordinated with his psychiatrist who will be taking over Duloxetine management and will increasing the dose to 30mg  QHS.  He also reports good pain relief with Voltaren Gel and is using OTC soft cervical collar at night with good effect.    Current treatments: Acetominophen 1000mg  BID or TID, Duloxetine 30mg  QHS, Voltaren Gel, OTC, OTC Cervical soft collar QHS   Additional therapy: Wellbutrin (cigeratte sensation, will be d/c'n soon per patient).   Prior treatments: Gabapentin 400mg  TID (no effect), Lyrica (minimum effect)    Review of Systems:  Fever/chills: denies Bowel/bladder changes: denies (other then discussed as above).    Past Medical History:   Diagnosis Date   ??? Alcoholism (CMS-HCC)    ??? Anxiety    ??? Arthritis    ??? Asthma    ??? Atrial fibrillation (CMS-HCC)    ??? Bipolar affective (CMS-HCC)    ??? Bronchitis    ??? Depression    ??? Headache    ??? Hepatitis C    ??? Hyperlipidemia    ??? Hypertension    ??? Infectious viral hepatitis    ??? Joint pain    ??? Lung disease    ??? Polysubstance abuse (CMS-HCC)    ??? RA (rheumatoid arthritis) (CMS-HCC)    ??? Seizures (CMS-HCC)    ??? Shoulder injury    ??? Sleep apnea, obstructive    ??? Sleeping difficulties         Family History   Problem Relation Age of Onset   ??? Heart attack Mother    ??? Hypertension Mother    ??? Arthritis Mother    ??? Cancer Mother    ??? Heart disease Mother    ??? Hypertension Father    ??? Arthritis Father    ??? Heart disease Father    ??? Arthritis Sister    ??? Migraines Sister    ??? Fibromyalgia Paternal Aunt        Social History     Socioeconomic History   ??? Marital status: Single     Spouse name: Not on file   ??? Number of children: Not on file   ??? Years of education: Not on file   ??? Highest education level: Not on file   Occupational History   ??? Not on file   Social Needs   ??? Financial resource strain: Not on file   ??? Food insecurity     Worry: Not on file     Inability: Not on file   ??? Transportation needs     Medical: Not on file     Non-medical: Not on file   Tobacco Use   ??? Smoking status: Current Every Day Smoker     Packs/day: 1.00     Years: 30.00     Pack years: 30.00     Types: Cigarettes     Start date: 05/19/1984   ??? Smokeless tobacco: Former Neurosurgeon     Types: Snuff   Substance and Sexual Activity   ??? Alcohol use: No     Alcohol/week: 0.0 standard drinks     Comment: recovering   ??? Drug use: No   ??? Sexual activity: Not on file   Lifestyle   ??? Physical activity     Days per week: Not on file     Minutes per session: Not on file   ??? Stress: Not on file   Relationships   ??? Social connections  Talks on phone: Not on file Gets together: Not on file     Attends religious service: Not on file     Active member of club or organization: Not on file     Attends meetings of clubs or organizations: Not on file     Relationship status: Not on file   Other Topics Concern   ??? Exercise Not Asked   ??? Living Situation Not Asked   Social History Narrative   ??? Not on file          Current Outpatient Medications:   ???  acetaminophen (TYLENOL) 500 MG tablet, Take 1,000 mg by mouth every six (6) hours as needed for pain., Disp: , Rfl:   ???  adalimumab (HUMIRA) 40 mg/0.8 mL injection, Inject 0.8 mL (40 mg total) under the skin every seven (7) days. Dose change to weekly, Disp: 12 each, Rfl: 3  ???  albuterol HFA 90 mcg/actuation inhaler, INHALE 2 PUFFS EVERY SIX (6) HOURS AS NEEDED FOR WHEEZING., Disp: 20.1 g, Rfl: 6  ???  albuterol HFA 90 mcg/actuation inhaler, INHALE 2 PUFFS EVERY SIX (6) HOURS AS NEEDED FOR WHEEZING., Disp: 20.1 g, Rfl: 3  ???  amLODIPine (NORVASC) 5 MG tablet, Take 10 mg by mouth daily. , Disp: , Rfl:   ???  benztropine (COGENTIN) 0.5 MG tablet, Take 0.5 mg by mouth Two (2) times a day., Disp: , Rfl:   ???  buPROPion (WELLBUTRIN XL) 150 MG 24 hr tablet, Take 150 mg by mouth daily., Disp: , Rfl:   ???  diclofenac sodium (VOLTAREN) 1 % gel, Apply 2 g topically Four (4) times a day., Disp: 720 g, Rfl: 1  ???  DULoxetine (CYMBALTA) 20 MG capsule, Take 1 capsule (20 mg total) by mouth nightly., Disp: 30 capsule, Rfl: 2  ???  hydroCHLOROthiazide (HYDRODIURIL) 25 MG tablet, Take 25 mg by mouth daily., Disp: , Rfl: 4  ???  methotrexate 25 mg/mL injection solution, INJECT 1 ML (25 MG TOTAL) UNDER THE SKIN ONCE A WEEK., Disp: 4 mL, Rfl: 2  ???  metoprolol tartrate (LOPRESSOR) 25 MG tablet, Take 50 mg by mouth Two (2) times a day. , Disp: , Rfl:   ???  mometasone-formoterol 200-5 mcg/actuation HFAA, INHALE 2 PUFFS TWO (2) TIMES A DAY., Disp: 39 g, Rfl: 6  ???  mometasone-formoterol 200-5 mcg/actuation HFAA, INHALE 2 PUFFS TWO (2) TIMES A DAY., Disp: 39 g, Rfl: 3  ??? ranitidine (ZANTAC) 150 MG tablet, Take 150 mg by mouth Two (2) times a day., Disp: , Rfl:   ???  risperiDONE (RISPERDAL) 2 MG tablet, Take 2 mg by mouth nightly., Disp: , Rfl:   ???  sertraline (ZOLOFT) 50 MG tablet, Take 100 mg by mouth daily. , Disp: , Rfl: 1  ???  simvastatin (ZOCOR) 20 MG tablet, Take 40 mg by mouth nightly. , Disp: , Rfl:   ???  syringe with needle 1 mL 25 gauge x 5/8 Syrg, Use as directed to inject methotrexate, Disp: 12 each, Rfl: PRN     MEDICAL DECISION MAKING  ??  Test Results:  Imaging:   ?? CT C-spine.  [place].  Date: 05/27/2018.  Prior C5-C7 ACDF with fracture of the left C7 screw with minimal backing out of the screw head. No ankylosis evident at C6-C7. Unchanged spondylosis compared to MRI from 08/22/2017.    ?? C-spine XR. [place]. Date: 03/17/2018.  Status post C5 7 ACDF with concern for failure of C7 screw and possible pseudoarthrosis at C6-7  I, Duke Salvia, NP, personally interpreted the images.  The available reports were reviewed.     Labs:    WBC   Date Value Ref Range Status   04/13/2018 9.5 4.5 - 11.0 10*9/L Final     HGB   Date Value Ref Range Status   04/13/2018 17.0 13.5 - 17.5 g/dL Final     HCT   Date Value Ref Range Status   04/13/2018 50.3 41.0 - 53.0 % Final     Platelet   Date Value Ref Range Status   04/13/2018 236 150 - 440 10*9/L Final     Creatinine   Date Value Ref Range Status   04/13/2018 0.78 0.70 - 1.30 mg/dL Final     AST   Date Value Ref Range Status   04/13/2018 38 17 - 47 U/L Final     AFP-Tumor Marker   Date Value Ref Range Status   08/25/2014 2.04 <7.51 ng/mL Final       I spent 12 minutes on the phone with the patient. I spent an additional 10 minutes on pre- and post-visit activities.     The patient was physically located in [place] or a state in which I am permitted to provide care. The patient understood that s/he may incur co-pays and cost sharing, and agreed to the telemedicine visit. The visit was completed via phone and/or video, which was appropriate and reasonable under the circumstances given the patient's presentation at the time.    The patient has been advised of the potential risks and limitations of this mode of treatment (including, but not limited to, the absence of in-person examination) and has agreed to be treated using telemedicine. The patient's/patient's family's questions regarding telemedicine have been answered.     If the phone/video visit was completed in an ambulatory setting, the patient has also been advised to contact their provider???s office for worsening conditions, and seek emergency medical treatment and/or call 911 if the patient deems either necessary.        mJOA SCORE   Score   UPPER EXTREMITY MOTOR SUBSCORE (/5) 3 (Description: Able to button a shirt with great difficulty)   LOWER EXTREMITY SUBSCORE (/7) 4 (Description: Able to walk without walking aid, but must hold a handrail on stairs)   UPPER EXTREMITY SENSORY SUBSCORE (/3) 1 (Description: Severe loss of hand sensation OR pain)   URINARY FUNCTION SUBSCORE (/3) 2 (Description: Urinary urgency OR occasional stress incontinence - less than once per month)      TOTAL SCORE (07/09/2018): 10   PREVIOUS SCORE (05/08/2018): 10

## 2018-07-15 MED ORDER — EMPTY CONTAINER
PRN refills | 0 days
Start: 2018-07-15 — End: ?

## 2018-07-15 MED ORDER — METHOTREXATE SODIUM 25 MG/ML INJECTION SOLUTION
SUBCUTANEOUS | 11 refills | 28 days | Status: CP
Start: 2018-07-15 — End: 2019-07-15

## 2018-07-15 NOTE — Unmapped (Signed)
[facility] Refill Coordination Note    Specialty Medication(s) to be Shipped:   Inflammatory Disorders: methotrexate (injectable)    Other medication(s) to be shipped: n/a     Joseph Sandoval, DOB: 11-Sep-1970  Phone: [phone] (home) [phone] (work)      All above HIPAA information was verified with patient.     Completed refill call assessment today to schedule patient's medication shipment from the [facility] [phone]).       Specialty medication(s) and dose(s) confirmed: Regimen is correct and unchanged.   Changes to medications: Joseph Sandoval reports no changes at this time.  Changes to insurance: No  Questions for the pharmacist: No    Confirmed patient received Welcome Packet with first shipment. The patient will receive a drug information handout for each medication shipped and additional FDA Medication Guides as required.       DISEASE/MEDICATION-SPECIFIC INFORMATION        For patients on injectable medications: Patient currently has 2 doses left.  Next injection is scheduled for n/a.    SPECIALTY MEDICATION ADHERENCE     Medication Adherence    Patient reported X missed doses in the last month:  0  Specialty Medication:  methotrexate  Patient is on additional specialty medications:  No  Patient is on more than two specialty medications:  No  Any gaps in refill history greater than 2 weeks in the last 3 months:  no  Demonstrates understanding of importance of adherence:  yes  Informant:  patient  Adherence tools used:  calendar                Methotrexate 25mg /ml: Patient has 14 days of medication on hand      SHIPPING     Shipping address confirmed in Epic.     Delivery Scheduled: Yes, Expected medication delivery date: 07/22/18.  However, Rx request for refills was sent to the provider as there are none remaining.     Medication will be delivered via UPS to the home address in Epic WAM.    Olga Millers   [facility] Specialty Technician

## 2018-07-15 NOTE — Unmapped (Signed)
Methotrexate refill  Last ov: 04/13/2018  Next ov: 09/07/2018   Labs:   AST   Date Value Ref Range Status   04/13/2018 38 17 - 47 U/L Final     ALT   Date Value Ref Range Status   04/13/2018 57 (H) <50 U/L Final     Creatinine   Date Value Ref Range Status   04/13/2018 0.78 0.70 - 1.30 mg/dL Final     WBC   Date Value Ref Range Status   04/13/2018 9.5 4.5 - 11.0 10*9/L Final     HGB   Date Value Ref Range Status   04/13/2018 17.0 13.5 - 17.5 g/dL Final     HCT   Date Value Ref Range Status   04/13/2018 50.3 41.0 - 53.0 % Final     MCV   Date Value Ref Range Status   04/13/2018 100.7 (H) 80.0 - 100.0 fL Final     RDW   Date Value Ref Range Status   04/13/2018 14.3 12.0 - 15.0 % Final     Platelet   Date Value Ref Range Status   04/13/2018 236 150 - 440 10*9/L Final     Neutrophils %   Date Value Ref Range Status   04/13/2018 53.3 % Final     Lymphocytes %   Date Value Ref Range Status   04/13/2018 33.2 % Final     Monocytes %   Date Value Ref Range Status   04/13/2018 5.3 % Final     Eosinophils %   Date Value Ref Range Status   04/13/2018 2.5 % Final     Basophils %   Date Value Ref Range Status   04/13/2018 1.1 % Final

## 2018-07-21 MED FILL — METHOTREXATE SODIUM 25 MG/ML INJECTION SOLUTION: 70 days supply | Qty: 10 | Fill #0 | Status: AC

## 2018-07-21 MED FILL — METHOTREXATE SODIUM 25 MG/ML INJECTION SOLUTION: SUBCUTANEOUS | 70 days supply | Qty: 10 | Fill #0

## 2018-07-24 ENCOUNTER — Telehealth: Admit: 2018-07-24 | Discharge: 2018-07-25 | Attending: Orthopaedic Surgery | Primary: Orthopaedic Surgery

## 2018-07-24 DIAGNOSIS — M960 Pseudarthrosis after fusion or arthrodesis: Secondary | ICD-10-CM

## 2018-07-24 DIAGNOSIS — M47812 Spondylosis without myelopathy or radiculopathy, cervical region: Principal | ICD-10-CM

## 2018-07-24 NOTE — Unmapped (Signed)
ORTHOPAEDIC SPINE CLINIC NOTE       Joseph Commander, MD  Clinical Professor of Orthopaedics  [phone]        Patient Name:Joseph Sandoval  MRN: [id number]  DOB: [birth year]    Date: 07/24/2018    PCP: [facility]    ASSESSMENT:     Joseph Sandoval  48 y.o. male  [id number] Joseph Sandoval  Former contruction. Recov EtOH/71yrs. Applying for disability.  Neuropathy. Afib. COPD. OSA. Bipolar. Tob. Seropos RA. HepC.  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 No c-radic.  S/p C5-7 ACDF (Monday 03/31/17 at [facility])  XR-C 03/2018 - C7 fractured screw, protruding 1-78mm.  CT-C 05/2018 - Solid fusion at C5-6. Likely fused at C6-7.     PLAN:     ?? Medications: Take medications as precribed by your Medical Team  ?? Activity: Activities as tolerated.  ?? Conservative Care: Let's continue to monitor your symptoms.  ?? Surgical Care: Routine surgical follow-up.  ?? Imaging studies: Cervical AP, lateral, flexion, and extension views (prior to next visit). To be done near home and uploaded prior to visit.  ?? Follow-up: Video Return Visit  ?? To be scheduled in about 6 month(s).     Pt will also need c-spine xr today to eval fusion, instability, and broken screw ... I've written to Spine RN pool to arrange and upload, and notify me when done.  I will contact pt with results.       SUBJECTIVE:     Chief Complaint:  Neck pain    History of Present Illness:     Doing OK  Meds help with nerve pain  Movements cause pain    Occ dysphagia  Sometimes - with sausage or bacon  Hung on back of throat    Strength is not good  - in a flare - legs swelling    Got approved for disability  Has more trouble with left arm than right.    Review of Systems:  Fever/chills :denies  Bowel/bladder symptoms :denies     Medical History   Past Medical History:   Diagnosis Date   ??? Alcoholism (CMS-HCC)    ??? Anxiety    ??? Arthritis    ??? Asthma    ??? Atrial fibrillation (CMS-HCC)    ??? Bipolar affective (CMS-HCC)    ??? Bronchitis    ??? Depression    ??? Headache    ??? Hepatitis C    ??? Hyperlipidemia    ??? Hypertension    ??? Infectious viral hepatitis    ??? Joint pain    ??? Lung disease    ??? Polysubstance abuse (CMS-HCC)    ??? RA (rheumatoid arthritis) (CMS-HCC)    ??? Seizures (CMS-HCC)    ??? Shoulder injury    ??? Sleep apnea, obstructive    ??? Sleeping difficulties      Patient Active Problem List   Diagnosis   ??? Alcohol-induced mood disorder (CMS-HCC)   ??? Alcohol withdrawal syndrome (CMS-HCC)   ??? Bipolar affective disorder (CMS-HCC)   ??? Bronchiolitis   ??? Chest pain   ??? Atrial fibrillation (CMS-HCC)   ??? Major depressive disorder   ??? Tobacco dependence syndrome   ??? Essential hypertension (RAF-HCC)   ??? OSA (obstructive sleep apnea)   ??? 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 HLD (hyperlipidemia)   ???  Degenerative joint disease   ??? Numbness in feet   ??? Carpal tunnel syndrome, left   ??? Rheumatoid arthritis involving multiple sites (CMS-HCC)   ??? Cervical spine pain      Surgical History   He  has a past surgical history that includes Cardiac surgery; pr total hip arthroplasty (Right, 12/28/2015); pr wrist arthroscop,release xvers lig (Left, 03/21/2016); pr nervous system surgery unlisted (N/A, 03/21/2016); pr rad excis wrist synov/tendon,flexor (Left, 03/21/2016); pr revise median n/carpal tunnel surg (Left, 10/08/2016); pr revise median n/carpal tunnel surg (Right, 10/22/2016); pr arthrodesis ant interbody inc discectomy, cervical below c2 (N/A, 03/31/2017); pr arthrodesis ant interbody inc discectomy, cervical below c2 each addl (N/A, 03/31/2017); pr anterior instrumentation 2-3 vertebral segments (N/A, 03/31/2017); pr insj biomchn dev intervertebral dsc spc w/arthrd (N/A, 03/31/2017); pr autograft spine surgery local from same incision (N/A, 03/31/2017); pr allograft for spine surgery only morselized (N/A, 03/31/2017); and pr ionm 1 on 1 in or w/attendance each 15 minutes (N/A, 03/31/2017).     Allergies   Lisinopril   Medications   Current Outpatient Medications   Medication Sig Dispense Refill   ??? acetaminophen (TYLENOL) 500 MG tablet Take 1,000 mg by mouth every six (6) hours as needed for pain.     ??? adalimumab (HUMIRA) 40 mg/0.8 mL injection Inject 0.8 mL (40 mg total) under the skin every seven (7) days. Dose change to weekly 12 each 3   ??? amLODIPine (NORVASC) 5 MG tablet Take 10 mg by mouth daily.      ??? baclofen (LIORESAL) 10 MG tablet Take 10 mg by mouth Three (3) times a day.     ??? benztropine (COGENTIN) 0.5 MG tablet Take 0.5 mg by mouth Two (2) times a day.     ??? diclofenac sodium (VOLTAREN) 1 % gel Apply 2 g topically Four (4) times a day. 720 g 1   ??? DULoxetine (CYMBALTA) 20 MG capsule Take 1 capsule (20 mg total) by mouth nightly. 30 capsule 2   ??? hydroCHLOROthiazide (HYDRODIURIL) 25 MG tablet Take 25 mg by mouth daily.  4   ??? methotrexate 25 mg/mL injection solution Inject 1 mL (25 mg total) under the skin every seven (7) days. 4 mL 11   ??? metoprolol tartrate (LOPRESSOR) 25 MG tablet Take 50 mg by mouth Two (2) times a day.      ??? risperiDONE (RISPERDAL) 2 MG tablet Take 2 mg by mouth nightly.     ??? simvastatin (ZOCOR) 20 MG tablet Take 40 mg by mouth nightly.      ??? albuterol HFA 90 mcg/actuation inhaler INHALE 2 PUFFS EVERY SIX (6) HOURS AS NEEDED FOR WHEEZING. 20.1 g 6   ??? albuterol HFA 90 mcg/actuation inhaler INHALE 2 PUFFS EVERY SIX (6) HOURS AS NEEDED FOR WHEEZING. 20.1 g 3   ??? buPROPion (WELLBUTRIN XL) 150 MG 24 hr tablet Take 150 mg by mouth daily.     ??? mometasone-formoterol 200-5 mcg/actuation HFAA INHALE 2 PUFFS TWO (2) TIMES A DAY. 39 g 6   ??? mometasone-formoterol 200-5 mcg/actuation HFAA INHALE 2 PUFFS TWO (2) TIMES A DAY. 39 g 3   ??? ranitidine (ZANTAC) 150 MG tablet Take 150 mg by mouth Two (2) times a day.     ??? sertraline (ZOLOFT) 50 MG tablet Take 100 mg by mouth daily.   1   ??? syringe with needle 1 mL 25 gauge x 5/8 Syrg Use as directed to inject methotrexate (Patient not taking: Reported on 07/23/2018) 12 each PRN  No current facility-administered medications for this visit.       Social History   Social History     Social History Narrative   ??? Not on file     He  reports that he has been smoking cigarettes. He started smoking about 34 years ago. He has a 30.00 pack-year smoking history. He has quit using smokeless tobacco.  His smokeless tobacco use included snuff. He reports that he does not drink alcohol or use drugs.   The history recorded in the table above was reviewed.    OBJECTIVE:     PHYSICAL EXAM via Video:  Appearance: well-nourished and no acute distress   Affect: alert, cooperative and pleasant  Extremities: Able to elevate both shoulders fully     MEDICAL DECISION MAKING    Test Results:  Imaging:   C-spine Xrays pending    Discussion:  ?? Clinical findings, diagnostic/treatment options, and plan were discussed with the patient.    Video MDM Coding:  ?? Time 27m - Ret 3 - Dr. Jaynie Aboud spent a total > 15 minutes - on the video visit with the patient and for pre- and post-visit activities.     cc: Self, Referred, [facility]    I spent 8 minutes on the audio/video with the patient. I spent an additional 10 minutes on pre- and post-visit activities.     The patient was physically located in [place] or a state in which I am permitted to provide care. The patient and/or parent/guardian understood that s/he may incur co-pays and cost sharing, and agreed to the telemedicine visit. The visit was reasonable and appropriate under the circumstances given the patient's presentation at the time.    The patient and/or parent/guardian has been advised of the potential risks and limitations of this mode of treatment (including, but not limited to, the absence of in-person examination) and has agreed to be treated using telemedicine. The patient's/patient's family's questions regarding telemedicine have been answered.     If the phone/video visit was completed in an ambulatory setting, the patient and/or parent/guardian has also been advised to contact their provider???s office for worsening conditions, and seek emergency medical treatment and/or call 911 if the patient deems either necessary.

## 2018-07-24 NOTE — Unmapped (Signed)
??   Medications: Take medications as precribed by your Medical Team  ?? Activity: Activities as tolerated.  ?? Conservative Care: Let's continue to monitor your symptoms.  ?? Surgical Care: Routine surgical follow-up.  ?? Imaging studies: Cervical AP, lateral, flexion, and extension views (prior to next visit).  ?? Follow-up: Video Return Visit  ?? To be scheduled in about 6 month(s).

## 2018-07-28 NOTE — Unmapped (Signed)
Spoke with patient and he stated he is able to get his x-rays done and that he does not need prior authorization. He requested order to be faxed to [facility], fax # [phone]    Fax sent and confirmed received.

## 2018-07-28 NOTE — Unmapped (Signed)
Called patient inquring if he has had his imaging done ordered by Dr. Jaynie Brase. He stated that he has been approved for disability but did not know how to schedule his appointment because he was waiting to hear from [facility]. He was unsure if he had to pay out of pocket. Call forwarded to financial counselor.

## 2018-07-29 ENCOUNTER — Other Ambulatory Visit: Payer: Self-pay | Admitting: Orthopedic Surgery

## 2018-07-29 ENCOUNTER — Ambulatory Visit
Admission: RE | Admit: 2018-07-29 | Discharge: 2018-07-29 | Disposition: A | Discharge status: Home / Self Care | Payer: No Typology Code available for payment source | Source: Ambulatory Visit | Attending: Orthopedic Surgery | Admitting: Orthopedic Surgery

## 2018-07-29 DIAGNOSIS — M47812 Spondylosis without myelopathy or radiculopathy, cervical region: Secondary | ICD-10-CM

## 2018-07-31 NOTE — Unmapped (Signed)
Verified that patient had his X-rays Cervical AP/Lateral and Flexion and Extension images done. He stated that he had them completed in [facility]. Will upload imaging from [facility].

## 2018-08-04 ENCOUNTER — Ambulatory Visit: Admit: 2018-08-04 | Discharge: 2018-08-05 | Payer: MEDICARE

## 2018-08-12 NOTE — Unmapped (Signed)
patient has enough medication on hand, rescheduling refill call for 8/5

## 2018-08-25 NOTE — Unmapped (Signed)
Patient advised of imaging results. In person appointment scheduled in 3 mos and confirmed.

## 2018-08-25 NOTE — Unmapped (Addendum)
-----   Message from Nemiah Commander, MD sent at 08/13/2018  5:28 PM EDT -----  Regarding: RE: Outside imaging uploaded  Good afternoon,  Please call pt.    Let him know that I looked at films.  Stable.    Please arrange a live visit with [facility] c-spine prior to visit in 3 months.  Thank you.  -ML

## 2018-08-26 ENCOUNTER — Ambulatory Visit: Payer: Medicaid Other | Admitting: Family Medicine

## 2018-08-26 ENCOUNTER — Other Ambulatory Visit: Payer: Self-pay

## 2018-08-26 VITALS — BP 138/80 | HR 89 | Wt 253.6 lb

## 2018-08-26 DIAGNOSIS — I10 Essential (primary) hypertension: Secondary | ICD-10-CM | POA: Diagnosis present

## 2018-08-26 DIAGNOSIS — E785 Hyperlipidemia, unspecified: Secondary | ICD-10-CM

## 2018-08-26 DIAGNOSIS — Z833 Family history of diabetes mellitus: Secondary | ICD-10-CM | POA: Diagnosis not present

## 2018-08-26 DIAGNOSIS — M069 Rheumatoid arthritis, unspecified: Secondary | ICD-10-CM | POA: Diagnosis not present

## 2018-08-26 NOTE — Patient Instructions (Signed)
It was great to meet you today! Thank you for letting me participate in your care!  Today, we discussed establishing your care with our clinic! I am glad you have decided to get a family doctor. Please call me with concerns about your health. Please continue to see your specialists as they advise.  I will call you if your blood work is abnormal, otherwise I will send a letter.  Be well, Harolyn Rutherford, DO PGY-3, [facility]

## 2018-08-26 NOTE — Progress Notes (Signed)
Subjective: Chief Complaint  Patient presents with  . Establish Care     HPI: Blake Arnold is a 48 y.o. presenting to clinic today to discuss the following:  [place] is a 48y/o male new patient with reported PMH of HTN, Rhuematoid Arthritis, HLD, Depression, Anxiety, Hx of Atrial Fibrillation s/p cardioversion, COPD, Sleep Apnea, chronic low back pain, and history of substance abuse.   He presents today for management of his chronic medical conditions as he has just obtained insurance and he would like to begin to transfer his care from [facility] specialists in [place] to [place] as he now lives in this area.  Currently he feels well and has no complaints today. He denies fever, chills, weight loss, chest pain, SOB, difficulty breathing, abdominal pain, nausea, vomiting, dysuria, diarrhea, constipation, blood in the urine or stool.  HTN Patient states he take amlodipine 5mg  daily and has been well controlled on this med "for several years". He denies any side effects of amlodipine and denies headaches and vision changes.  HLD Patient has not had lipids checked in over one year. He does not take a statin. He does have a history of high cholesterol.  FH: Mom has HTN, A. Fib, and DMT2; Dad has spinal stenosis     ROS noted in HPI.   Past Medical, Surgical, Social, and Family History Reviewed & Updated per EMR.   Pertinent Historical Findings include:   Social History   Tobacco Use  Smoking Status Current Every Day Smoker  . Packs/day: 1.00  . Years: 25.00  . Pack years: 25.00  . Types: Cigarettes  Smokeless Tobacco Former Systems developer  . Quit date: 10/24/2012    Objective: BP 138/80   Pulse 89   Wt 253 lb 9.6 oz (115 kg)   SpO2 97%   BMI 38.56 kg/m  Vitals and nursing notes reviewed  Physical Exam Gen: Alert and Oriented x 3, NAD HEENT: Normocephalic, atraumatic, PERRLA, EOMI, TM visible with good light reflex, non-swollen, non-erythematous  turbinates, non-erythematous pharyngeal mucosa, no exudates Neck: trachea midline, no thyroidmegaly, no LAD CV: RRR, no murmurs, normal S1, S2 split Resp: CTAB, no wheezing, rales, or rhonchi, comfortable work of breathing Abd: non-distended, non-tender, soft, +bs in all four quadrants Ext: no clubbing, cyanosis, or edema Neuro: CN II-XII grossly intact, 5/5 strength in UE and LE bilaterally, gross sensation intact Skin: warm, dry, intact, no rashes  LABS CBC Latest Ref Rng & Units 08/26/2018 09/28/2017 07/24/2017  WBC 3.4 - 10.8 x10E3/uL 7.7 12.4(H) 19.9(H)  Hemoglobin 13.0 - 17.7 g/dL 16.5 15.1 16.0  Hematocrit 37.5 - 51.0 % 47.8 46.0 47.6  Platelets 150 - 450 x10E3/uL 236 196 199   BMP Latest Ref Rng & Units 08/26/2018 09/28/2017 07/23/2017  Glucose 65 - 99 mg/dL 92 104(H) -  BUN 6 - 24 mg/dL 11 <5(L) -  Creatinine 0.76 - 1.27 mg/dL 0.97 0.85 1.07  BUN/Creat Ratio 9 - 20 11 - -  Sodium 134 - 144 mmol/L 145(H) 137 -  Potassium 3.5 - 5.2 mmol/L 4.0 3.7 -  Chloride 96 - 106 mmol/L 103 105 -  CO2 20 - 29 mmol/L 23 25 -  Calcium 8.7 - 10.2 mg/dL 9.0 8.9 -   Lipid Panel     Component Value Date/Time   CHOL 174 08/26/2018 1440   TRIG 168 (H) 08/26/2018 1440   HDL 42 08/26/2018 1440   CHOLHDL 4.1 08/26/2018 1440   CHOLHDL 3.0 07/24/2017 0314  VLDL 16 07/24/2017 0314   LDLCALC 98 08/26/2018 1440   A1c: pending  Assessment/Plan:  HTN (hypertension) Well controlled today on Amlodipine 5mg  with no side effects - Cont Amlodipine 5mg  daily  Hyperlipidemia Hx of hyperlipidemia but not on a statin. He has no history of heart disease and is not a diabetic as far as he knows. - Obtain A1c today - Obtain lipid panel today; start statin if elevated   PATIENT EDUCATION PROVIDED: See AVS    Diagnosis and plan along with any newly prescribed medication(s) were discussed in detail with this patient today. The patient verbalized understanding and agreed with the plan. Patient advised if  symptoms worsen return to clinic or ER.    Orders Placed This Encounter  Procedures  . Basic Metabolic Panel  . Lipid Panel  . CBC  . POCT glycosylated hemoglobin (Hb A1C)    Associate with Z13.1    No orders of the defined types were placed in this encounter.    Harolyn Rutherford, DO 08/26/2018, 2:06 PM PGY-3 [place]

## 2018-08-27 ENCOUNTER — Encounter: Payer: Self-pay | Admitting: Family Medicine

## 2018-08-27 LAB — CBC
Hematocrit: 47.8 % (ref 37.5–51.0)
Hemoglobin: 16.5 g/dL (ref 13.0–17.7)
MCH: 34.2 pg — ABNORMAL HIGH (ref 26.6–33.0)
MCHC: 34.5 g/dL (ref 31.5–35.7)
MCV: 99 fL — ABNORMAL HIGH (ref 79–97)
Platelets: 236 10*3/uL (ref 150–450)
RBC: 4.82 x10E6/uL (ref 4.14–5.80)
RDW: 11.9 % (ref 11.6–15.4)
WBC: 7.7 10*3/uL (ref 3.4–10.8)

## 2018-08-27 LAB — LIPID PANEL
Chol/HDL Ratio: 4.1 ratio (ref 0.0–5.0)
Cholesterol, Total: 174 mg/dL (ref 100–199)
HDL: 42 mg/dL (ref >39)
LDL Calculated: 98 mg/dL (ref 0–99)
Triglycerides: 168 mg/dL — ABNORMAL HIGH (ref 0–149)
VLDL Cholesterol Cal: 34 mg/dL (ref 5–40)

## 2018-08-27 LAB — BASIC METABOLIC PANEL
BUN/Creatinine Ratio: 11 (ref 9–20)
BUN: 11 mg/dL (ref 6–24)
CO2: 23 mmol/L (ref 20–29)
Calcium: 9 mg/dL (ref 8.7–10.2)
Chloride: 103 mmol/L (ref 96–106)
Creatinine, Ser: 0.97 mg/dL (ref 0.76–1.27)
GFR calc Af Amer: 106 mL/min/{1.73_m2} (ref >59)
GFR calc non Af Amer: 91 mL/min/{1.73_m2} (ref >59)
Glucose: 92 mg/dL (ref 65–99)
Potassium: 4 mmol/L (ref 3.5–5.2)
Sodium: 145 mmol/L — ABNORMAL HIGH (ref 134–144)

## 2018-08-27 NOTE — Progress Notes (Signed)
Normal results to be mailed to patient

## 2018-09-01 ENCOUNTER — Other Ambulatory Visit: Payer: Self-pay

## 2018-09-01 ENCOUNTER — Ambulatory Visit (INDEPENDENT_AMBULATORY_CARE_PROVIDER_SITE_OTHER): Payer: Medicaid Other | Admitting: Family Medicine

## 2018-09-01 VITALS — BP 140/78 | HR 98 | Temp 98.3°F

## 2018-09-01 DIAGNOSIS — M25561 Pain in right knee: Secondary | ICD-10-CM

## 2018-09-01 DIAGNOSIS — M25551 Pain in right hip: Secondary | ICD-10-CM

## 2018-09-01 DIAGNOSIS — E785 Hyperlipidemia, unspecified: Secondary | ICD-10-CM | POA: Insufficient documentation

## 2018-09-01 MED ORDER — ACETAMINOPHEN 500 MG PO TABS: 1000.0000 mg | ORAL_TABLET | Freq: Three times a day (TID) | ORAL | 0 refills | Status: AC | PRN

## 2018-09-01 MED ORDER — NAPROXEN 500 MG PO TABS: 500.0000 mg | ORAL_TABLET | Freq: Two times a day (BID) | ORAL | 0 refills | Status: DC

## 2018-09-01 NOTE — Progress Notes (Signed)
    Subjective:  Blake Arnold is a 48 y.o. male who presents to the [facility] today with a chief complaint of right hip and knee pain.   HPI:  Patient has had 1 week of worsening right hip and knee pain.  Past medical history significant for RA and OA of bilateral knees and hip.  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  Regarding his current knee pain and hip pain, he was walking with his son about 1 week ago where he walked approximately 300 feet.  After this he is just had significant worsening and decline in function.  Says that he has to use a wall to walk to stabilize himself.  Denies any trauma to the area.  Denies fevers, chills, rash or falls, chest pain or shortness of breath.  Says that this is not in the normal distribution of his RA flare.  RA flares typically happen in his small joints of his hands bilaterally.  Patient has been trying diclofenac gel rub and Tylenol for pain relief with minimal improvement.   Patient is followed up with rheumatologist on 7/28.  ROS: Per HPI  PMH: Smoking history reviewed.  Patient still an active smoker.   Objective:  Physical Exam: BP 140/78   Pulse 98   Temp 98.3 F (36.8 C) (Oral)   SpO2 96%   Gen: Mildly uncomfortable, no acute distress CV: RRR with no murmurs appreciated Pulm: NWOB, CTAB with no crackles, wheezes, or rhonchi MSK: Right knee minimal crepitus, full range of motion, no swelling or erythema bilaterally, neither knee is warm compared to the other, mild tenderness to inguinal region with palpation, positive straight leg raise when lying flat, able to walk but has a limp with right leg, able to get up on exam table, negative anterior and posterior posterior drawer test of right knee, no laxity of right knee, limited range of motion of hip joint due to pain Skin: Significant sunburn on upper extremities and back exposed through shirt, face swelling Neuro:  grossly normal, moves all extremities Psych: Normal affect and thought content  No results found for this or any previous visit (from the past 72 hour(s)).   Assessment/Plan:  Right knee and hip pain Likely acute on chronic OA flare.  Much less likely rheumatoid arthritis flare given location and loadbearing joints, lack of effusion or warmth joint, unilateral distribution.  Patient is normal renal function with last creatinine of 0.97 on 08/26/2018.  Trial short course of NSAIDs with Tylenol as needed pain for 1 week.  Low threshold to obtain imaging if not improving including x-rays right knee and right hip.  Patient follow-up in 1 week or sooner if not improved.  Return precautions given.    Lab Orders  No laboratory test(s) ordered today    Meds ordered this encounter  Medications  . naproxen (NAPROSYN) 500 MG tablet    Sig: Take 1 tablet (500 mg total) by mouth 2 (two) times daily with a meal.    Dispense:  30 tablet    Refill:  0  . acetaminophen (TYLENOL) 500 MG tablet    Sig: Take 2 tablets (1,000 mg total) by mouth every 8 (eight) hours as needed for up to 7 days for moderate pain.    Dispense:  30 tablet    Refill:  0      Marny Lowenstein, MD, MS FAMILY MEDICINE RESIDENT - PGY2 09/01/2018 3:39 PM

## 2018-09-01 NOTE — Assessment & Plan Note (Signed)
Well controlled today on Amlodipine 5mg  with no side effects - Cont Amlodipine 5mg  daily

## 2018-09-01 NOTE — Assessment & Plan Note (Signed)
Hx of hyperlipidemia but not on a statin. He has no history of heart disease and is not a diabetic as far as he knows. - Obtain A1c today - Obtain lipid panel today; start statin if elevated

## 2018-09-01 NOTE — Patient Instructions (Signed)
It was a pleasure to see you today! Thank you for choosing [facility] for your primary care. Blake Arnold was seen for knee and hip pain.   1. You likely have acute worsening of your osteoarthritis. A RA flair is less likely given your lack of swollen joints, the location in large joints, lack of fevers, chills, rash.   2. We are going to treat you with 1 week of prescription strength naproxen. You may also take tylenol with this as prescribed.   3. Come back to the clinic in 1 week or sooner if you are not having any improvement in symptoms. At that time we can consider getting xrays.   4. Be sure to follow up with your rheumatologist for further management of RA.    Best,  Marny Lowenstein, MD, MS FAMILY MEDICINE RESIDENT - PGY2 09/01/2018 3:27 PM

## 2018-09-07 ENCOUNTER — Encounter: Admit: 2018-09-07 | Discharge: 2018-09-08 | Payer: MEDICARE | Attending: Internal Medicine | Primary: Internal Medicine

## 2018-09-07 DIAGNOSIS — R21 Rash and other nonspecific skin eruption: Secondary | ICD-10-CM

## 2018-09-07 DIAGNOSIS — Z79899 Other long term (current) drug therapy: Secondary | ICD-10-CM

## 2018-09-07 DIAGNOSIS — M059 Rheumatoid arthritis with rheumatoid factor, unspecified: Principal | ICD-10-CM

## 2018-09-07 DIAGNOSIS — M25551 Pain in right hip: Secondary | ICD-10-CM

## 2018-09-07 NOTE — Unmapped (Signed)
Thank you for coming to your appointment.    Please get your blood work done and we will let you know the results.    We are not making changes to your medications today.     We will see you in 6 months or sooner if needed.

## 2018-09-07 NOTE — Unmapped (Signed)
This was a virtual visit. I discussed the patient with the resident and reviewed the residents note. I agree with the findings and plan.  Theodis Aguas, MD

## 2018-09-07 NOTE — Unmapped (Signed)
RHEUMATOLOGY CLINIC FOLLOW-UP NOTE      Assessment/Plan:      Seropositive nonerosive RA - See HPI for disease history, but briefly: diagnosed 2017; previously on MTX/HCQ/adalimumab now down-titrated to MTX/adalimumab. RA symptoms are stable without changes since last visit.  -- Medications: cont adalimumab qweek, MTX 25mg  subQ qweekly; folic acid 1mg  qdaily  -- Labs: ordered standing q96month labs  -- Avoid prednisone in future with hx AVN  -- Imaging: hand xrays 2019 (-)erosions; C-spine xrays pending in anticipation of potential c-spine surgery  -- Encouraged smoking cessation 1.5 PPD --> varenicline query in process per outside provider    Rash - Unable to complete exam with telephone visit. Based on description low suspicion for drug induced reaction despite patient noting rash might have been precipitated by sun [this is unclear].  -- Patient has OSH provider follow-up this week --> recommended follow-up for further consideration including potential infectious/folliculitis process; patient agrees    Right hip pain - Onset over the last few months. Denies infectious sx in the setting of prior hip replacement 2017.   -- Patient has OSH Provider follow-up this week & notes that the plan had been to get images; likely warrants follow-up with ortho    High risk medication use  -- Labs: as above  -- Immunizations: future visits; UTD P13/23    RTC in Return in about 6 months (around 03/10/2019), or 6 months with Elam Dutch, for 6 months with Elam Dutch.    This patient was seen and discussed with Dr. Conception Chancy who agrees with the plan outlined above.    I spent 15 minutes on the phone with the patient. I spent an additional 5 minutes on pre- and post-visit activities.     The patient was physically located in [place] or a state in which I am permitted to provide care. The patient and/or parent/guardian understood that s/he may incur co-pays and cost sharing, and agreed to the telemedicine visit. The visit was reasonable and appropriate under the circumstances given the patient's presentation at the time.    The patient and/or parent/guardian has been advised of the potential risks and limitations of this mode of treatment (including, but not limited to, the absence of in-person examination) and has agreed to be treated using telemedicine. The patient's/patient's family's questions regarding telemedicine have been answered.     If the visit was completed in an ambulatory setting, the patient and/or parent/guardian has also been advised to contact their provider???s office for worsening conditions, and seek emergency medical treatment and/or call 911 if the patient deems either necessary.    Subjective:   Primary Care Provider: [facility]    HPI:   Joseph Sandoval is a 48 y.o.  male with a PMH of seropositive RA, prior polysubstance abuse, HCV s/p treatment, HTN, afib, neuropathy presenting for follow-up. Last seen in clinic March 2020 at which time he was clinically stable on adalimumab/MTX without changes. He was reporting L back/hip pain for which Xrays noted minimal retrolisthesis L5/S1, mild multilevel DDD, mild sclerosis L femoral head consistent with osteonecrosis.    Since last visit patient notes that he is feeling stable regarding RA symptoms. No AM stiffness/pain/swelling in UE or LE. Does have intermittent swelling in hands only after use. Notes in the AM and in the PM his 2nd and 3rd toes experience episodes of a sensation of 'hot needles' with weight bearing that lasts for around 1 minute before resolution. No swelling/erythema in toes.  Notes R hip & R knee are painful over the last few months. No noted swelling. Worse with ambulation after around 10-15 minutes of walking. Worse with weight bearing and with moving. Outside doctor gave naproxen without improvement. No imaging performed although patient notes that imaging was going to be the next step. He is seeing provider for follow-up this week.    Notes new 'bumps' on R arm described as around 1.0cm, raised, painful, highly pruritic, & erythematous. Only drain if he rubs/squeezes them. He developed 5 over the last month.  Cleaning with alcohol wipes which improves symptoms. He thought initially these came on with sun exposure although is uncertain.    Disease History:  -- Referred 2017 with wrist, MCP, PIP joint synovitis. Labs notable for initially positive RF with follow-up testing negative, (+)CCP 18. Hand imaging without erosions. Bilateral hip imaging with osteonecrosis for which he underwent R hip arthroplasty in 2017. Initiated MTX 2017 with step up to adalimumab/MTX 2017.   -- 2018 concern for uncontrolled sx prompting addition of HCQ which was discontinued 2019.  -- Spring 2019 increased adalimumab to qweekly. 2019 concern for poor disease control although he had missed several doses of IS in the setting of infections [appendicitis, diverticulitis].   -- Hx C5-C7 ACDF 2019 with 2020 concern for screw loosing/broken, pseudoarthrosis C6-C7 & myelopathy symptoms. Evaluated by Ortho with potential indication for surgery.    Further ROS as below:    Review of Systems   Constitutional: Negative for chills, fever and unexpected weight change.   HENT: Positive for congestion (allergies). Negative for mouth sores (No nasal or mouth sores.) and sore throat.    Eyes: Negative for pain, redness and visual disturbance.   Respiratory: Negative for cough and shortness of breath.    Cardiovascular: Negative for chest pain.   Gastrointestinal: Negative for abdominal pain, blood in stool, diarrhea and nausea.   Endocrine: Negative for polyuria.   Genitourinary: Negative for dysuria and hematuria.   Musculoskeletal: Positive for arthralgias (As above). Negative for joint swelling.   Skin: Positive for rash (As above.).        No changes in nails.   Allergic/Immunologic:        No recurrent infections   Neurological: Negative for weakness, light-headedness, numbness and headaches.   Hematological: Negative for adenopathy.     PMH, PFH, PSH as previously documented.    Medications were reviewed this visit.    Objective:   There were no vitals filed for this visit.  There is no height or weight on file to calculate BMI.    Physical Exam  -- GENERAL: no audible distress  -- PULM: no audible respiratory distress; speaking full sentences  -- PSYCH: pleasant; appropriate; engages in conversation    Test Results  No visits with results within 4 Week(s) from this visit.   Latest known visit with results is:   Office Visit on 04/13/2018   Component Date Value   ??? AST 04/13/2018 38    ??? ALT 04/13/2018 57*   ??? BUN 04/13/2018 13    ??? Creatinine 04/13/2018 0.78    ??? EGFR CKD-EPI Non-African* 04/13/2018 >90    ??? EGFR CKD-EPI African Ame* 04/13/2018 >90    ??? WBC 04/13/2018 9.5    ??? RBC 04/13/2018 5.00    ??? HGB 04/13/2018 17.0    ??? HCT 04/13/2018 50.3    ??? MCV 04/13/2018 100.7*   ??? MCH 04/13/2018 34.0    ??? MCHC 04/13/2018 33.8    ???  RDW 04/13/2018 14.3    ??? MPV 04/13/2018 6.9*   ??? Platelet 04/13/2018 236    ??? Neutrophils % 04/13/2018 53.3    ??? Lymphocytes % 04/13/2018 33.2    ??? Monocytes % 04/13/2018 5.3    ??? Eosinophils % 04/13/2018 2.5    ??? Basophils % 04/13/2018 1.1    ??? Absolute Neutrophils 04/13/2018 5.1    ??? Absolute Lymphocytes 04/13/2018 3.2    ??? Absolute Monocytes 04/13/2018 0.5    ??? Absolute Eosinophils 04/13/2018 0.2    ??? Absolute Basophils 04/13/2018 0.1    ??? Large Unstained Cells 04/13/2018 5*   ??? Macrocytosis 04/13/2018 Moderate*

## 2018-09-16 NOTE — Unmapped (Signed)
[facility] Clinical Assessment & Refill Coordination Note    Joseph Sandoval, DOB: 1970-10-24  Phone: [phone] (home) [phone] (work)    All above HIPAA information was verified with patient.     Specialty Medication(s):   Inflammatory Disorders: methotrexate (injectable)     Current Outpatient Medications   Medication Sig Dispense Refill   ??? acetaminophen (TYLENOL) 500 MG tablet Take 1,000 mg by mouth every six (6) hours as needed for pain.     ??? adalimumab (HUMIRA) 40 mg/0.8 mL injection Inject 0.8 mL (40 mg total) under the skin every seven (7) days. Dose change to weekly 12 each 3   ??? albuterol HFA 90 mcg/actuation inhaler INHALE 2 PUFFS EVERY SIX (6) HOURS AS NEEDED FOR WHEEZING. 20.1 g 6   ??? albuterol HFA 90 mcg/actuation inhaler INHALE 2 PUFFS EVERY SIX (6) HOURS AS NEEDED FOR WHEEZING. 20.1 g 3   ??? amLODIPine (NORVASC) 5 MG tablet Take 10 mg by mouth daily.      ??? baclofen (LIORESAL) 10 MG tablet Take 10 mg by mouth Three (3) times a day.     ??? benztropine (COGENTIN) 0.5 MG tablet Take 0.5 mg by mouth Two (2) times a day.     ??? buPROPion (WELLBUTRIN XL) 150 MG 24 hr tablet Take 150 mg by mouth daily.     ??? diclofenac sodium (VOLTAREN) 1 % gel Apply 2 g topically Four (4) times a day. 720 g 1   ??? DULoxetine (CYMBALTA) 20 MG capsule Take 1 capsule (20 mg total) by mouth nightly. 30 capsule 2   ??? hydroCHLOROthiazide (HYDRODIURIL) 25 MG tablet Take 25 mg by mouth daily.  4   ??? methotrexate 25 mg/mL injection solution Inject 1 mL (25 mg total) under the skin every seven (7) days. 4 mL 11   ??? metoprolol tartrate (LOPRESSOR) 25 MG tablet Take 50 mg by mouth Two (2) times a day.      ??? mometasone-formoterol 200-5 mcg/actuation HFAA INHALE 2 PUFFS TWO (2) TIMES A DAY. 39 g 6   ??? mometasone-formoterol 200-5 mcg/actuation HFAA INHALE 2 PUFFS TWO (2) TIMES A DAY. 39 g 3   ??? ranitidine (ZANTAC) 150 MG tablet Take 150 mg by mouth Two (2) times a day.     ??? risperiDONE (RISPERDAL) 2 MG tablet Take 2 mg by mouth nightly.     ??? sertraline (ZOLOFT) 50 MG tablet Take 100 mg by mouth daily.   1   ??? simvastatin (ZOCOR) 20 MG tablet Take 40 mg by mouth nightly.      ??? syringe with needle 1 mL 25 gauge x 5/8 Syrg Use as directed to inject methotrexate (Patient not taking: Reported on 07/23/2018) 12 each PRN     No current facility-administered medications for this visit.         Changes to medications: Joseph Sandoval reports no changes at this time.    Allergies   Allergen Reactions   ??? Lisinopril Swelling     swollen lips       Changes to allergies: No    SPECIALTY MEDICATION ADHERENCE     methotrexate 25 mg/ml: 14 days of medicine on hand     Medication Adherence    Patient reported X missed doses in the last month: 0  Specialty Medication: methotrexate 25mg /ml  Adherence tools used: calendar          Specialty medication(s) dose(s) confirmed: Regimen is correct and unchanged.     Are  there any concerns with adherence? No    Adherence counseling provided? Not needed    CLINICAL MANAGEMENT AND INTERVENTION      Clinical Benefit Assessment:    Do you feel the medicine is effective or helping your condition? Yes    Clinical Benefit counseling provided? Not needed    Adverse Effects Assessment:    Are you experiencing any side effects? No    Are you experiencing difficulty administering your medicine? No    Quality of Life Assessment:    How many days over the past month did your rheumatoid arthritis keep you from your normal activities? For example, brushing your teeth or getting up in the morning. 0    Have you discussed this with your provider? Not needed    Therapy Appropriateness:    Is therapy appropriate? Yes, therapy is appropriate and should be continued    DISEASE/MEDICATION-SPECIFIC INFORMATION      For patients on injectable medications: Patient currently has 2 doses left.  Next injection is scheduled for 09/17/2018.    PATIENT SPECIFIC NEEDS     ? Does the patient have any physical, cognitive, or cultural barriers? No    ? Is the patient high risk? No     ? Does the patient require a Care Management Plan? No     ? Does the patient require physician intervention or other additional services (i.e. nutrition, smoking cessation, social work)? No      SHIPPING     Specialty Medication(s) to be Shipped:   Inflammatory Disorders: methotrexate 25mg /ml    Other medication(s) to be shipped: syringes, sharps kit       Changes to insurance: No    Delivery Scheduled: Yes, Expected medication delivery date: 09/25/2018.     Medication will be delivered via UPS to the confirmed home address in [facility].    The patient will receive a drug information handout for each medication shipped and additional FDA Medication Guides as required.  Verified that patient has previously received a Conservation officer, historic buildings.    All of the patient's questions and concerns have been addressed.    Joseph Sandoval   [facility] Specialty Pharmacist

## 2018-09-24 MED FILL — EMPTY CONTAINER: 30 days supply | Qty: 1 | Fill #0

## 2018-09-24 MED FILL — METHOTREXATE SODIUM 25 MG/ML INJECTION SOLUTION: 84 days supply | Qty: 12 | Fill #1 | Status: AC

## 2018-09-24 MED FILL — BD TUBERCULIN SYRINGE 1 ML 25 GAUGE X 5/8": 84 days supply | Qty: 12 | Fill #1 | Status: AC

## 2018-09-24 MED FILL — EMPTY CONTAINER: 30 days supply | Qty: 1 | Fill #0 | Status: AC

## 2018-09-24 MED FILL — METHOTREXATE SODIUM 25 MG/ML INJECTION SOLUTION: SUBCUTANEOUS | 84 days supply | Qty: 12 | Fill #1

## 2018-09-24 MED FILL — BD TUBERCULIN SYRINGE 1 ML 25 GAUGE X 5/8": 84 days supply | Qty: 12 | Fill #1

## 2018-09-27 ENCOUNTER — Other Ambulatory Visit: Payer: Self-pay

## 2018-09-27 ENCOUNTER — Ambulatory Visit (HOSPITAL_COMMUNITY)
Admission: EM | Admit: 2018-09-27 | Discharge: 2018-09-27 | Disposition: A | Discharge status: Home / Self Care | Payer: Medicaid Other | Attending: Urgent Care | Admitting: Urgent Care

## 2018-09-27 ENCOUNTER — Encounter (HOSPITAL_COMMUNITY): Payer: Self-pay | Admitting: Emergency Medicine

## 2018-09-27 DIAGNOSIS — M79644 Pain in right finger(s): Secondary | ICD-10-CM

## 2018-09-27 DIAGNOSIS — Y93G1 Activity, food preparation and clean up: Secondary | ICD-10-CM | POA: Diagnosis not present

## 2018-09-27 DIAGNOSIS — S61214A Laceration without foreign body of right ring finger without damage to nail, initial encounter: Secondary | ICD-10-CM | POA: Diagnosis not present

## 2018-09-27 DIAGNOSIS — Z23 Encounter for immunization: Secondary | ICD-10-CM

## 2018-09-27 MED ORDER — LIDOCAINE HCL 2 % IJ SOLN
INTRAMUSCULAR | Status: AC
  Filled 2018-09-27: qty 20

## 2018-09-27 MED ORDER — TETANUS-DIPHTH-ACELL PERTUSSIS 5-2.5-18.5 LF-MCG/0.5 IM SUSP
0.5000 mL | Freq: Once | INTRAMUSCULAR | Status: AC
  Administered 2018-09-27: 0.5 mL via INTRAMUSCULAR

## 2018-09-27 MED ORDER — TETANUS-DIPHTH-ACELL PERTUSSIS 5-2.5-18.5 LF-MCG/0.5 IM SUSP
INTRAMUSCULAR | Status: AC
  Filled 2018-09-27: qty 0.5

## 2018-09-27 NOTE — ED Triage Notes (Signed)
Pt states he was washing dishes and there was a thin glass he was handling and it shaved a piece of skin on his R ring finger. Large skin flap attached on one end. Bleeding controlled. Full ROM.

## 2018-09-27 NOTE — ED Provider Notes (Signed)
MRN: [id number] DOB: 08/05/1970  Subjective:   Blake Arnold is a 48 y.o. male presenting for suffering an acute laceration to his right fourth ring finger while washing dishes.  Patient cleaned wound, covered it with a rag and came straight to the clinic.  He cannot recall his last Tdap.  Denies loss of range of motion.  Reports that he has longstanding history of loss of sensation to both arms and hands.  He is currently undergoing work-up and management with neurology.  No current facility-administered medications for this encounter.   Current Outpatient Medications:  .  Adalimumab (HUMIRA) 40 MG/0.8ML PSKT, Inject 0.8 mLs (40 mg total) into the skin once a week. tuesdays (Patient taking differently: Inject 0.8 mLs into the skin once a week. ), Disp: 3.2 mL, Rfl: 11 .  Albuterol Sulfate (PROAIR RESPICLICK) 510 (90 BASE) MCG/ACT AEPB, Inhale 2 puffs into the lungs every 4 (four) hours as needed (wheezing; cough)., Disp: 1 each, Rfl: 0 .  amLODipine (NORVASC) 5 MG tablet, Take 5 mg by mouth at bedtime., Disp: , Rfl: 4 .  benztropine (COGENTIN) 0.5 MG tablet, Take 0.5 mg by mouth 2 (two) times daily., Disp: , Rfl:  .  FOLIC ACID PO, Take by mouth., Disp: , Rfl:  .  gabapentin (NEURONTIN) 400 MG capsule, Take 400 mg by mouth 3 (three) times daily., Disp: , Rfl:  .  hydrochlorothiazide (HYDRODIURIL) 25 MG tablet, Take 25 mg by mouth daily., Disp: , Rfl:  .  Methotrexate, Anti-Rheumatic, (METHOTREXATE, PF, [place]), Inject 1 mL into the skin once a week. , Disp: , Rfl:  .  metoprolol tartrate (LOPRESSOR) 50 MG tablet, Take 50 mg by mouth 2 (two) times daily., Disp: , Rfl: 4 .  naproxen (NAPROSYN) 500 MG tablet, Take 1 tablet (500 mg total) by mouth 2 (two) times daily with a meal., Disp: 30 tablet, Rfl: 0 .  predniSONE (DELTASONE) 20 MG tablet, Day 1-5: Take 2 tablets daily. Day 6-10: Take 1 tablets daily. Take tablets with breakfast., Disp: 15 tablet, Rfl: 0 .  ranitidine (ZANTAC) 150 MG tablet, Take  150 mg by mouth 2 (two) times daily., Disp: , Rfl:  .  risperiDONE (RISPERDAL) 2 MG tablet, Take 1 tablet (2 mg total) by mouth at bedtime. For mood control, Disp: 14 tablet, Rfl: 0 .  sertraline (ZOLOFT) 100 MG tablet, Take 100 mg by mouth at bedtime., Disp: , Rfl: 0   Allergies  Allergen Reactions  . Lisinopril Swelling    Past Medical History:  Diagnosis Date  . A-fib ([place])   . Acid reflux   . Alcohol abuse   . Anxiety   . Asthma   . AVN (avascular necrosis of bone) ([place])   . Chronic back pain   . Depression   . Hepatitis C    history of  . History of urinary frequency   . Hypertension   . Peripheral neuropathy    hands and feet  . Polysubstance abuse ([place]) 01/25/2011  . 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[place])   . 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[place])   . Seizures ([place])      Past Surgical History:  Procedure Laterality Date  . APPENDECTOMY    . CARDIOVERSION  03/07/2006  . CARPAL TUNNEL RELEASE    . CERVICAL FUSION    . LAPAROSCOPIC APPENDECTOMY N/A 07/23/2017   Procedure: APPENDECTOMY LAPAROSCOPIC;  Surgeon: Georganna Skeans, MD;  Location: [facility];  Service: General;  Laterality: N/A;    ROS  Objective:   Vitals: BP  135/88   Pulse 85   Temp 98.9 F (37.2 C)   Resp 16   SpO2 95%   Physical Exam Constitutional:      Appearance: Normal appearance. He is well-developed and normal weight.  HENT:     Head: Normocephalic and atraumatic.     Right Ear: External ear normal.     Left Ear: External ear normal.     Nose: Nose normal.     Mouth/Throat:     Pharynx: Oropharynx is clear.  Eyes:     Extraocular Movements: Extraocular movements intact.     Pupils: Pupils are equal, round, and reactive to light.  Cardiovascular:     Rate and Rhythm: Normal rate.  Pulmonary:     Effort: Pulmonary effort is normal.  Musculoskeletal:     Right hand: He exhibits tenderness (laceration) and laceration (J-shaped laceration over left fourth finger is depicted, extending about ~2.5cm). He  exhibits normal range of motion, no bony tenderness, normal capillary refill, no deformity and no swelling. Normal sensation noted. Normal strength noted.  Neurological:     Mental Status: He is alert and oriented to person, place, and time.  Psychiatric:        Mood and Affect: Mood normal.        Behavior: Behavior normal.       PROCEDURE NOTE: laceration repair Verbal consent obtained from patient.  Local anesthesia with 3cc Lidocaine 2% without epinephrine.  Wound explored for tendon, ligament damage. Wound scrubbed with soap and water and rinsed. Wound closed with #5 4-0 Ethilon (#4 simple interrupted, #1 horizontal mattress) sutures.  Wound cleansed and dressed.   Assessment and Plan :   1. Laceration of right ring finger without foreign body without damage to nail, initial encounter   2. Finger pain, right   3. Need for diphtheria-tetanus-pertussis (Tdap) vaccine     Laceration repaired successfully. Wound care reviewed. Return-to-clinic precautions discussed, patient verbalized understanding. Otherwise, follow up in 10 days for suture removal.     Jaynee Eagles, PA-C 09/27/18 1701

## 2018-09-27 NOTE — Discharge Instructions (Signed)

## 2018-11-06 ENCOUNTER — Other Ambulatory Visit: Payer: Self-pay

## 2018-11-06 ENCOUNTER — Ambulatory Visit (INDEPENDENT_AMBULATORY_CARE_PROVIDER_SITE_OTHER): Payer: Medicaid Other | Admitting: Family Medicine

## 2018-11-06 DIAGNOSIS — M25561 Pain in right knee: Secondary | ICD-10-CM | POA: Diagnosis not present

## 2018-11-06 DIAGNOSIS — M25551 Pain in right hip: Secondary | ICD-10-CM

## 2018-11-06 MED ORDER — NAPROXEN 500 MG PO TABS: 500.0000 mg | ORAL_TABLET | Freq: Two times a day (BID) | ORAL | 0 refills | Status: DC

## 2018-11-06 MED ORDER — GABAPENTIN 400 MG PO CAPS: 400.0000 mg | ORAL_CAPSULE | Freq: Three times a day (TID) | ORAL | 2 refills | Status: DC

## 2018-11-06 MED ORDER — TRAMADOL HCL 50 MG PO TABS: 50.0000 mg | ORAL_TABLET | Freq: Four times a day (QID) | ORAL | 0 refills | Status: AC | PRN

## 2018-11-06 NOTE — Patient Instructions (Signed)
Is a quick summary of things we talked about today:  Tylenol-continue taking Tylenol at current dose Baclofen-continue taking your muscle relaxer as prescribed.  I think this will be helpful in the next couple days Gabapentin-this medication is previously on your list in our system.  He can start taking this again for the next couple of days if it is helpful.  Will make you groggy.  You can discontinue this again as your pain improves. Naproxen-I would use this medication sparingly over the next couple of days.  Stop taking this medication if you notice any sores in your mouth or stomach pain. Tramadol-there is a light opioid medication.  I have only given you enough for a couple of days.  I am sure to hear about your worsening pain today.  Hopefully, this will subside in the next day or 2.  Go to the ED if you notice any new numbness in your groin or have urinary or fecal incontinence.  Give [place] a call or return to clinic if symptoms persist past the next 3 to 5 days.

## 2018-11-06 NOTE — Assessment & Plan Note (Signed)
His pain today appears to be consistent with his previous hip pain from rheumatoid arthritis in addition to his degenerative joint disease of the lumbar spine noted previously on abdomen pelvis CT.  He notes no significant changes at quality of his pain only it is lasting longer than a typical episode.  He denies red flag symptoms (saddle paresthesias, urinary/fecal incontinence).  We will plan to administer additional medication over the next 3 to 5 days and anticipate resolution. -Continue Tylenol 3 g/day as he is currently taking -Continue baclofen 10 mg 3 times daily as needed -Begin naproxen 500 mg twice daily as needed (stop naproxen if you notice oral ulcers or stomach pain) -Begin gabapentin 400 mg 3 times daily as needed, can taper on your own his back pain subsides -Begin tramadol 50 mg every 6 hours as needed for the next 3 days -Call or return to clinic if no improvement in the next 3 to 5 days

## 2018-11-06 NOTE — Progress Notes (Signed)
    Subjective:  Blake Arnold is a 48 y.o. male who presents to the [facility] today with a chief complaint of back pain.   HPI: Back pain Blake Arnold has had hip/back pain for several years.  He occasionally does experience exacerbations of this pain which generally resolve after a matter of days.  He is currently being treated for osteoarthritis and is taking methotrexate and Humira.  He reports that he had an exacerbation of his back/hip pain starting this morning.  He was folding laundry when he noticed significant shooting pain starting in his lower back shooting toward his left hip.  Generally, this pain subsides rather quickly after seconds to minutes but this seemed to persist for hours.  This does feel like a typical back pain flare or episode although it is lasting longer than normal.  He decided to be seen today in clinic instead of waiting for the pain to improve resolved.  Before coming to clinic today, he did try taking 10 mg of baclofen at home in addition to his typical dose of Tylenol (he generally takes 3 g of Tylenol per day for his pain).  He is hoping to find some medications today that would help him through the next several days of acute pain.  He has previously been seen by physical therapy and was recommended to stop physical therapy by his orthopedic surgeon due to worsened pain.  He is planning to see his orthopedic surgeon on October 18.  He denies numbness or tingling in his penis or anus.  He denies urinary and fecal incontinence.  Chief Complaint noted Review of Symptoms - see HPI PMH - Smoking status noted.    Objective:  Physical Exam: BP 134/78   Pulse 92   SpO2 97%    Gen: NAD, seated comfortably on the edge of his chair to avoid additional back pain.  Able to ambulate around the room without difficulty and step up onto the exam table. MSK: No tenderness to palpation over the spinous processes, no paraspinal tenderness no tenderness of the SI joint.  Mild  tenderness in the lower lumbar and sacral region.  No significant tenderness with palpation of the trochanters bilaterally.  Shooting back pain with radiation to the hip during straight leg raise bilaterally.  Negative straight leg test bilaterally.  No results found for this or any previous visit (from the past 72 hour(s)).   Assessment/Plan:  RA (rheumatoid arthritis) (HCC) His pain today appears to be consistent with his previous hip pain from rheumatoid arthritis in addition to his degenerative joint disease of the lumbar spine noted previously on abdomen pelvis CT.  He notes no significant changes at quality of his pain only it is lasting longer than a typical episode.  He denies red flag symptoms (saddle paresthesias, urinary/fecal incontinence).  We will plan to administer additional medication over the next 3 to 5 days and anticipate resolution. -Continue Tylenol 3 g/day as he is currently taking -Continue baclofen 10 mg 3 times daily as needed -Begin naproxen 500 mg twice daily as needed (stop naproxen if you notice oral ulcers or stomach pain) -Begin gabapentin 400 mg 3 times daily as needed, can taper on your own his back pain subsides -Begin tramadol 50 mg every 6 hours as needed for the next 3 days -Call or return to clinic if no improvement in the next 3 to 5 days

## 2018-11-30 ENCOUNTER — Ambulatory Visit: Admit: 2018-11-30 | Discharge: 2018-11-30 | Attending: Orthopaedic Surgery | Primary: Orthopaedic Surgery

## 2018-11-30 ENCOUNTER — Ambulatory Visit: Admit: 2018-11-30 | Discharge: 2018-11-30

## 2018-11-30 DIAGNOSIS — M059 Rheumatoid arthritis with rheumatoid factor, unspecified: Principal | ICD-10-CM

## 2018-11-30 DIAGNOSIS — M5412 Radiculopathy, cervical region: Principal | ICD-10-CM

## 2018-11-30 DIAGNOSIS — Z79899 Other long term (current) drug therapy: Principal | ICD-10-CM

## 2018-11-30 NOTE — Unmapped (Signed)
Your diagnosis: Neck pain    Recommendations/Plan  ?? Medications: Take over-the-counter medications as needed and as tolerated  ?? Activity: Activities as tolerated.  ?? Surgical Care: Continued surgical follow-up.  ?? Imaging studies: Cervical AP, lateral, flexion, and extension views (prior to next visit). - Would like to assess the broken screw.  ?? Follow-up: In-Person Return Visit. To be scheduled in about 6 month(s).    Contact our team electronically via MyChart message to [facility] Clinical Staff.    Contact our team at [phone] or [phone] with any questions/concerns.

## 2018-11-30 NOTE — Unmapped (Signed)
ORTHOPAEDIC SPINE CLINIC NOTE       Nemiah Commander, MD  Clinical Professor of Orthopaedics  [phone]        Patient Name:Joseph Sandoval  MRN: [id number]  DOB: 12/17/1970    Date: 11/30/2018    PCP: [facility]    ASSESSMENT:     48 y.o. male  [phone] [name]  Former contruction. Recov EtOH/32yrs. Applying for disability.  Neuropathy. Afib. COPD. OSA. Bipolar. Tob. Seropos RA. HepC.  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 No c-radic.  S/p C5-7 ACDF (Monday 03/31/17 at [facility])  XR-C 03/2018 - C7 fractured screw, protruding 1-23mm.  CT-C 05/2018 - Solid fusion at C5-6. Likely fused at C6-7.  XR-C 07/2018 - Fractured screw/protrusion unchanged.      PLAN:     ?? Medications: Take over-the-counter medications as needed and as tolerated  ?? Activity: Activities as tolerated.  ?? Surgical Care: Continued surgical follow-up.  ?? Imaging studies: Cervical AP, lateral, flexion, and extension views (prior to next visit). - Would like to assess the broken screw.  ?? Follow-up: In-Person Return Visit. To be scheduled in about 6 month(s).     SUBJECTIVE:     Chief Complaint:  Neck pain    History of Present Illness:        11/30/18 1414   PainSc:   7     Pain in neck  Pain in both shoulders and elbows    No trouble swallowing  No trouble with voice    Review of Systems:  Fever/chills: denies    Medical History   Past Medical History:   Diagnosis Date   ??? Alcoholism (CMS-HCC)    ??? Anxiety    ??? Arthritis    ??? Asthma    ??? Atrial fibrillation (CMS-HCC)    ??? Bipolar affective (CMS-HCC)    ??? Bronchitis    ??? Depression    ??? Headache    ??? Hepatitis C    ??? Hyperlipidemia    ??? Hypertension    ??? Infectious viral hepatitis    ??? Joint pain    ??? Lung disease    ??? Polysubstance abuse (CMS-HCC)    ??? RA (rheumatoid arthritis) (CMS-HCC)    ??? Seizures (CMS-HCC)    ??? Shoulder injury    ??? Sleep apnea, obstructive ??? Sleeping difficulties      Patient Active Problem List   Diagnosis   ??? Alcohol-induced mood disorder (CMS-HCC)   ??? Alcohol withdrawal syndrome (CMS-HCC)   ??? Bipolar affective disorder (CMS-HCC)   ??? Bronchiolitis   ??? Chest pain   ??? Atrial fibrillation (CMS-HCC)   ??? Major depressive disorder   ??? Tobacco dependence syndrome   ??? Essential hypertension (RAF-HCC)   ??? OSA (obstructive sleep apnea)   ??? 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 HLD (hyperlipidemia)   ??? Degenerative joint disease   ??? Numbness in feet   ??? Carpal tunnel syndrome, left   ??? Rheumatoid arthritis involving multiple sites (CMS-HCC)   ??? Cervical spine pain      Surgical History   He  has a past surgical history that includes Cardiac surgery; pr total hip arthroplasty (Right, 12/28/2015); pr wrist arthroscop,release xvers lig (Left, 03/21/2016); pr nervous system surgery unlisted (N/A, 03/21/2016); pr rad excis wrist synov/tendon,flexor (Left, 03/21/2016); pr revise median n/carpal tunnel surg (Left, 10/08/2016);  pr revise median n/carpal tunnel surg (Right, 10/22/2016); pr arthrodesis ant interbody inc discectomy, cervical below c2 (N/A, 03/31/2017); pr arthrodesis ant interbody inc discectomy, cervical below c2 each addl (N/A, 03/31/2017); pr anterior instrumentation 2-3 vertebral segments (N/A, 03/31/2017); pr insj biomchn dev intervertebral dsc spc w/arthrd (N/A, 03/31/2017); pr autograft spine surgery local from same incision (N/A, 03/31/2017); pr allograft for spine surgery only morselized (N/A, 03/31/2017); and pr ionm 1 on 1 in or w/attendance each 15 minutes (N/A, 03/31/2017).     Allergies   Lisinopril   Medications   Current Outpatient Medications   Medication Sig Dispense Refill   ??? acetaminophen (TYLENOL) 500 MG tablet Take 1,000 mg by mouth every six (6) hours as needed for pain.     ??? adalimumab (HUMIRA) 40 mg/0.8 mL injection Inject 0.8 mL (40 mg total) under the skin every seven (7) days. Dose change to weekly 12 each 3 ??? albuterol HFA 90 mcg/actuation inhaler INHALE 2 PUFFS EVERY SIX (6) HOURS AS NEEDED FOR WHEEZING. 20.1 g 6   ??? albuterol HFA 90 mcg/actuation inhaler INHALE 2 PUFFS EVERY SIX (6) HOURS AS NEEDED FOR WHEEZING. 20.1 g 3   ??? amLODIPine (NORVASC) 5 MG tablet Take 10 mg by mouth daily.      ??? baclofen (LIORESAL) 10 MG tablet Take 10 mg by mouth Three (3) times a day.     ??? benztropine (COGENTIN) 0.5 MG tablet Take 0.5 mg by mouth Two (2) times a day.     ??? buPROPion (WELLBUTRIN XL) 150 MG 24 hr tablet Take 150 mg by mouth daily.     ??? diclofenac sodium (VOLTAREN) 1 % gel Apply 2 g topically Four (4) times a day. 720 g 1   ??? DULoxetine (CYMBALTA) 20 MG capsule Take 1 capsule (20 mg total) by mouth nightly. 30 capsule 2   ??? empty container Misc Use as directed 1 each PRN   ??? hydroCHLOROthiazide (HYDRODIURIL) 25 MG tablet Take 25 mg by mouth daily.  4   ??? methotrexate 25 mg/mL injection solution Inject 1 mL (25 mg total) under the skin every seven (7) days. 4 mL 11   ??? metoprolol tartrate (LOPRESSOR) 25 MG tablet Take 50 mg by mouth Two (2) times a day.      ??? mometasone-formoterol 200-5 mcg/actuation HFAA INHALE 2 PUFFS TWO (2) TIMES A DAY. 39 g 6   ??? mometasone-formoterol 200-5 mcg/actuation HFAA INHALE 2 PUFFS TWO (2) TIMES A DAY. 39 g 3   ??? sertraline (ZOLOFT) 50 MG tablet Take 100 mg by mouth daily.   1   ??? simvastatin (ZOCOR) 20 MG tablet Take 40 mg by mouth nightly.      ??? syringe with needle 1 mL 25 gauge x 5/8 Syrg Use as directed to inject methotrexate 12 each PRN   ??? ranitidine (ZANTAC) 150 MG tablet Take 150 mg by mouth Two (2) times a day.     ??? risperiDONE (RISPERDAL) 2 MG tablet Take 2 mg by mouth nightly.       No current facility-administered medications for this visit.       Social History   Social History     Social History Narrative   ??? Not on file He  reports that he has been smoking cigarettes. He started smoking about 34 years ago. He has a 30.00 pack-year smoking history. He has quit using smokeless tobacco.  His smokeless tobacco use included snuff. He reports that he does not drink alcohol or use  drugs.   The history recorded in the table above was reviewed.     OBJECTIVE:     PHYSICAL EXAM (6+pts):  Vitals: BP 143/88  - Pulse 72  - Ht (!) 68 cm (2' 2.77)  - Wt (!) 114.9 kg (253 lb 6.4 oz)  - BMI 248.57 kg/m??   Appearance: well-nourished and no acute distress   Affect: alert, cooperative and pleasant  Gait: normal  Extremities: Strength intact in bilat UE.  Skin: No cyanosis, clubbing, and normal skin in bilat hands.  Incision healed.     MEDICAL DECISION MAKING    Test Results:  Imaging:   C-spine XR. [place]. Today.  No instability.  Screw broken, but unchanged in position.  Images were reviewed with the patient on the PACS monitor. The available reports were reviewed. I, Dr. Nemiah Commander, personally interpreted the images      Discussion:  ?? Activities - Advised activities as tolerated, using pain as a guide.    E&M Coding:  ?? Level 3     cc: [place], [facility]

## 2018-12-09 DIAGNOSIS — M059 Rheumatoid arthritis with rheumatoid factor, unspecified: Principal | ICD-10-CM

## 2018-12-09 MED ORDER — HUMIRA SYRINGE CITRATE FREE 40 MG/0.4 ML
INJECTION | SUBCUTANEOUS | 3 refills | 0.00000 days | Status: CP
Start: 2018-12-09 — End: 2018-12-09
  Filled 2019-03-09: qty 12, 84d supply, fill #0

## 2018-12-09 MED ORDER — HUMIRA SYRINGE CITRATE FREE 40 MG/0.4 ML: 40 mg | Syringe | 3 refills | 0 days | Status: AC

## 2018-12-09 NOTE — Unmapped (Signed)
[facility] Refill Coordination Note    Specialty Medication(s) to be Shipped:   Inflammatory Disorders: methotrexate (injectable)    Other medication(s) to be shipped: n/a     Joseph Sandoval, DOB: October 28, 1970  Phone: [phone] (home) [phone] (work)      All above HIPAA information was verified with patient.     Completed refill call assessment today to schedule patient's medication shipment from the [facility] [phone]).       Specialty medication(s) and dose(s) confirmed: Regimen is correct and unchanged.   Changes to medications: Joseph Sandoval reports no changes at this time.  Changes to insurance: No  Questions for the pharmacist: No    Confirmed patient received Welcome Packet with first shipment. The patient will receive a drug information handout for each medication shipped and additional FDA Medication Guides as required.       DISEASE/MEDICATION-SPECIFIC INFORMATION        For patients on injectable medications: Patient currently has 2 doses left.  Next injection is scheduled for 12/10/18.    SPECIALTY MEDICATION ADHERENCE     Medication Adherence    Patient reported X missed doses in the last month: 1  Specialty Medication: methotrexate  Patient is on additional specialty medications: No  Patient is on more than two specialty medications: No  Any gaps in refill history greater than 2 weeks in the last 3 months: no  Demonstrates understanding of importance of adherence: yes  Informant: patient  Adherence tools used: calendar                Methotrexate 25mg /ml: Patient has 14 days of medication on hand      SHIPPING     Shipping address confirmed in Epic.     Delivery Scheduled: Yes, Expected medication delivery date: 12/22/18.     Medication will be delivered via UPS to the home address in Epic WAM.    Olga Millers   [facility] Specialty Technician

## 2018-12-09 NOTE — Unmapped (Signed)
[facility] RHEUMAT[facility] CLINIC - PHARMACIST NOTES     Humira prescription sent to [facility] at the request of MAP team for renewal of mfg assistance.  Changing to CF formulation.     Joseph Sandoval

## 2018-12-10 DIAGNOSIS — M059 Rheumatoid arthritis with rheumatoid factor, unspecified: Principal | ICD-10-CM

## 2018-12-10 NOTE — Unmapped (Signed)
Per test claim for Humira at the [facility], patient needs Medication Assistance Program for Prior Authorization.

## 2018-12-10 NOTE — Unmapped (Signed)
[facility] Specialty Medication Onboarding    Specialty Medication: HUMIRA   Prior Authorization: Approved   Financial Assistance: No - copay  <$25  Final Copay/Day Supply: $3 / 28 DAYS    Insurance Restrictions: None     Notes to Pharmacist: New rx received for manufacturer renewal, patient now has medicaid and PA approved on Medicaid.     The triage team has completed the benefits investigation and has determined that the patient is able to fill this medication at [facility]. Please contact the patient to complete the onboarding or follow up with the prescribing physician as needed.

## 2018-12-10 NOTE — Unmapped (Signed)
[facility]   Patient Onboarding/Medication Counseling    Joseph Sandoval is a 48 y.o. male with rheumatoid arthritis who I am counseling today on continuation of therapy.  I am speaking to the patient.    Verified patient's date of birth / HIPAA.    Specialty medication(s) to be sent: Inflammatory Disorders: Humira      Non-specialty medications/supplies to be sent: none       Humira (adalimumab)    Medication & Administration     Dosage: Rheumatoid arthritis: Inject 40mg  under the skin every 7 days    Lab tests required prior to treatment initiation:  ? Tuberculosis: Tuberculosis screening resulted in a non-reactive Quantiferon TB Gold assay.  ? Hepatitis B: Hepatitis B serology studies are complete and non-reactive.    Administration:     Prefilled auto-injector pen  1. Gather all supplies needed for injection on a clean, flat working surface: medication pen removed from packaging, alcohol swab, sharps container, etc.  2. Look at the medication label - look for correct medication, correct dose, and check the expiration date  3. Look at the medication - the liquid visible in the window on the side of the pen device should appear clear and colorless  4. Lay the auto-injector pen on a flat surface and allow it to warm up to room temperature for at least 30-45 minutes  5. Select injection site - you can use the front of your thigh or your belly (but not the area 2 inches around your belly button); if someone else is giving you the injection you can also use your upper arm in the skin covering your triceps muscle  6. Prepare injection site - wash your hands and clean the skin at the injection site with an alcohol swab and let it air dry, do not touch the injection site again before the injection 7. Pull the 2 safety caps straight off - gray/white to uncover the needle cover and the plum cap to uncover the plum activator button, do not remove until immediately prior to injection and do not touch the white needle cover  8. Gently squeeze the area of cleaned skin and hold it firmly to create a firm surface at the selected injection site  9. Put the white needle cover against your skin at the injection site at a 90 degree angle, hold the pen such that you can see the clear medication window  10. Press down and hold the pen firmly against your skin, press the plum activator button to initiate the injection, there will be a click when the injection starts  11. Continue to hold the pen firmly against your skin for about 10-15 seconds - the window will start to turn solid yellow  12. To verify the injection is complete after 10-15 seconds, look and ensure the window is solid yellow and then pull the pen away from your skin  13. Dispose of the used auto-injector pen immediately in your sharps disposal container the needle will be covered automatically  14. If you see any blood at the injection site, press a cotton ball or gauze on the site and maintain pressure until the bleeding stops, do not rub the injection site    Adherence/Missed dose instructions:  If your injection is given more than 3 days after your scheduled injection date ??? consult your pharmacist for additional instructions on how to adjust your dosing schedule.    Goals of Therapy     - Achieve symptom  remission  - Slow disease progression  - Protection of remaining articular structures  - Maintenance of function  - Maintenance of effective psychosocial functioning    Side Effects & Monitoring Parameters     ? Injection site reaction (redness, irritation, inflammation localized to the site of administration)  ? Signs of a common cold ??? minor sore throat, runny or stuffy nose, etc.  ? Upset stomach  ? Headache The following side effects should be reported to the provider:  ? Signs of a hypersensitivity reaction ??? rash; hives; itching; red, swollen, blistered, or peeling skin; wheezing; tightness in the chest or throat; difficulty breathing, swallowing, or talking; swelling of the mouth, face, lips, tongue, or throat; etc.  ? Reduced immune function ??? report signs of infection such as fever; chills; body aches; very bad sore throat; ear or sinus pain; cough; more sputum or change in color of sputum; pain with passing urine; wound that will not heal, etc.  Also at a slightly higher risk of some malignancies (mainly skin and blood cancers) due to this reduced immune function.  o In the case of signs of infection ??? the patient should hold the next dose of Humira?? and call your primary care provider to ensure adequate medical care.  Treatment may be resumed when infection is treated and patient is asymptomatic.  ? Changes in skin ??? a new growth or lump that forms; changes in shape, size, or color of a previous mole or marking  ? Signs of unexplained bruising or bleeding ??? throwing up blood or emesis that looks like coffee grounds; black, tarry, or bloody stool; etc.  ? Signs of new or worsening heart failure ??? shortness of breath; sudden weight gain; heartbeat that is not normal; swelling in the arms or legs that is new or worse      Contraindications, Warnings, & Precautions     ? Have your bloodwork checked as you have been told by your prescriber  ? Talk with your doctor if you are pregnant, planning to become pregnant, or breastfeeding  ? Discuss the possible need for holding your dose(s) of Humira?? when a planned procedure is scheduled with the prescriber as it may delay healing/recovery timeline       Drug/Food Interactions     ? Medication list reviewed in Epic. The patient was instructed to inform the care team before taking any new medications or supplements. No drug interactions identified. ? Talk with you prescriber or pharmacist before receiving any live vaccinations while taking this medication and after you stop taking it    Storage, Handling Precautions, & Disposal     ? Store this medication in the refrigerator.  Do not freeze  ? If needed, you may store at room temperature for up to 14 days  ? Store in [employer], protected from light  ? Do not shake  ? Dispose of used syringes/pens in a sharps disposal container    ** Due to currently taking this medication and feeling comfortable with the information, the patient declined the following portions of the counseling outlined above: administration; adherence / missed dose instructions; goals of therapy; side effects and monitoring parameters; contraindications, warnings, and precautions; drug/food interactions; storage, handling precautions, and disposal **      Current Medications (including OTC/herbals), Comorbidities and Allergies     Current Outpatient Medications   Medication Sig Dispense Refill   ??? acetaminophen (TYLENOL) 500 MG tablet Take 1,000 mg by mouth every six (6) hours as needed  for pain.     ??? albuterol HFA 90 mcg/actuation inhaler INHALE 2 PUFFS EVERY SIX (6) HOURS AS NEEDED FOR WHEEZING. 20.1 g 6   ??? albuterol HFA 90 mcg/actuation inhaler INHALE 2 PUFFS EVERY SIX (6) HOURS AS NEEDED FOR WHEEZING. 20.1 g 3   ??? amLODIPine (NORVASC) 5 MG tablet Take 10 mg by mouth daily.      ??? baclofen (LIORESAL) 10 MG tablet Take 10 mg by mouth Three (3) times a day.     ??? benztropine (COGENTIN) 0.5 MG tablet Take 0.5 mg by mouth Two (2) times a day.     ??? buPROPion (WELLBUTRIN XL) 150 MG 24 hr tablet Take 150 mg by mouth daily.     ??? DULoxetine (CYMBALTA) 20 MG capsule Take 1 capsule (20 mg total) by mouth nightly. 30 capsule 2   ??? empty container Misc Use as directed 1 each PRN   ??? HUMIRA SYRINGE CITRATE FREE 40 MG/0.4 ML Inject the contents of 1 syringe (40 mg total) under the skin every seven (7) days. 12 each 3 ??? hydroCHLOROthiazide (HYDRODIURIL) 25 MG tablet Take 25 mg by mouth daily.  4   ??? methotrexate 25 mg/mL injection solution Inject 1 mL (25 mg total) under the skin every seven (7) days. 4 mL 11   ??? metoprolol tartrate (LOPRESSOR) 25 MG tablet Take 50 mg by mouth Two (2) times a day.      ??? mometasone-formoterol 200-5 mcg/actuation HFAA INHALE 2 PUFFS TWO (2) TIMES A DAY. 39 g 6   ??? mometasone-formoterol 200-5 mcg/actuation HFAA INHALE 2 PUFFS TWO (2) TIMES A DAY. 39 g 3   ??? ranitidine (ZANTAC) 150 MG tablet Take 150 mg by mouth Two (2) times a day.     ??? risperiDONE (RISPERDAL) 2 MG tablet Take 2 mg by mouth nightly.     ??? sertraline (ZOLOFT) 50 MG tablet Take 100 mg by mouth daily.   1   ??? simvastatin (ZOCOR) 20 MG tablet Take 40 mg by mouth nightly.      ??? syringe with needle 1 mL 25 gauge x 5/8 Syrg Use as directed to inject methotrexate 12 each PRN     No current facility-administered medications for this visit.        Allergies   Allergen Reactions   ??? Lisinopril Swelling     swollen lips       Patient Active Problem List   Diagnosis   ??? Alcohol-induced mood disorder (CMS-HCC)   ??? Alcohol withdrawal syndrome (CMS-HCC)   ??? Bipolar affective disorder (CMS-HCC)   ??? Bronchiolitis   ??? Chest pain   ??? Atrial fibrillation (CMS-HCC)   ??? Major depressive disorder   ??? Tobacco dependence syndrome   ??? Essential hypertension (RAF-HCC)   ??? OSA (obstructive sleep apnea)   ??? 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 HLD (hyperlipidemia)   ??? Degenerative joint disease   ??? Numbness in feet   ??? Carpal tunnel syndrome, left   ??? Rheumatoid arthritis involving multiple sites (CMS-HCC)   ??? Cervical spine pain       Reviewed and up to date in Epic.    Appropriateness of Therapy     Is medication and dose appropriate based on diagnosis? Yes    Baseline Quality of Life Assessment      How many days over the past month did your rheumatoid arthritis keep you from your normal activities? 0    Financial Information Medication Assistance provided:  Prior Authorization    Anticipated copay of $3.00 reviewed with patient. Verified delivery address.    Delivery Information     Scheduled delivery date: n/a; [name] has 11 doses on hand from his last shipment from the manufacturer assistance program    Expected start date: n/a; [name] is currently taking this medication    Medication shipment will be delivered when scheduled in the future via UPS to the prescription address in Epic [place].  This shipment will not require a signature.      Explained the services we provide at [facility] and that each month we would call to set up refills.  Stressed importance of returning phone calls so that we could ensure they receive their medications in time each month.  Informed patient that we should be setting up refills 7-10 days prior to when they will run out of medication.  A pharmacist will reach out to perform a clinical assessment periodically.  Informed patient that a welcome packet and a drug information handout will be sent.      Patient verbalized understanding of the above information as well as how to contact the pharmacy at [phone] option 4 with any questions/concerns.  The pharmacy is open Monday through Friday 8:30am-4:30pm.  A pharmacist is available 24/7 via pager to answer any clinical questions they may have.    Patient Specific Needs     ? Does the patient have any physical, cognitive, or cultural barriers? No    ? Patient prefers to have medications discussed with  Patient     ? Is the patient able to read and understand education materials at a high school level or above? Yes    ? Patient's primary language is  English     ? Is the patient high risk? No     ? Does the patient require a Care Management Plan? No     ? Does the patient require physician intervention or other additional services (i.e. nutrition, smoking cessation, social work)? No      Karene Fry [facility] Specialty Pharmacist

## 2018-12-16 ENCOUNTER — Ambulatory Visit (INDEPENDENT_AMBULATORY_CARE_PROVIDER_SITE_OTHER): Payer: Medicaid Other | Admitting: Family Medicine

## 2018-12-16 ENCOUNTER — Other Ambulatory Visit: Payer: Self-pay

## 2018-12-16 VITALS — BP 140/80 | HR 89 | Wt 256.0 lb

## 2018-12-16 DIAGNOSIS — M25511 Pain in right shoulder: Secondary | ICD-10-CM | POA: Diagnosis present

## 2018-12-16 DIAGNOSIS — Z981 Arthrodesis status: Secondary | ICD-10-CM | POA: Diagnosis not present

## 2018-12-16 DIAGNOSIS — F17200 Nicotine dependence, unspecified, uncomplicated: Secondary | ICD-10-CM

## 2018-12-16 NOTE — Patient Instructions (Signed)
Shoulder Pain Many things can cause shoulder pain, including:  An injury.  Moving the shoulder in the same way again and again (overuse).  Joint pain (arthritis). Pain can come from:  Swelling and irritation (inflammation) of any part of the shoulder.  An injury to the shoulder joint.  An injury to: ? Tissues that connect muscle to bone (tendons). ? Tissues that connect bones to each other (ligaments). ? Bones. Follow these instructions at home: Watch for changes in your symptoms. Let your doctor know about them. Follow these instructions to help with your pain. If you have a sling:  Wear the sling as told by your doctor. Remove it only as told by your doctor.  Loosen the sling if your fingers: ? Tingle. ? Become numb. ? Turn cold and blue.  Keep the sling clean.  If the sling is not waterproof: ? Do not let it get wet. ? Take the sling off when you shower or bathe. Managing pain, stiffness, and swelling   If told, put ice on the painful area: ? Put ice in a plastic bag. ? Place a towel between your skin and the bag. ? Leave the ice on for 20 minutes, 2-3 times a day. Stop putting ice on if it does not help with the pain.  Squeeze a soft ball or a foam pad as much as possible. This prevents swelling in the shoulder. It also helps to strengthen the arm. General instructions  Take over-the-counter and prescription medicines only as told by your doctor.  Keep all follow-up visits as told by your doctor. This is important. Contact a doctor if:  Your pain gets worse.  Medicine does not help your pain.  You have new pain in your arm, hand, or fingers. Get help right away if:  Your arm, hand, or fingers: ? Tingle. ? Are numb. ? Are swollen. ? Are painful. ? Turn white or blue. Summary  Shoulder pain can be caused by many things. These include injury, moving the shoulder in the same away again and again, and joint pain.  Watch for changes in your symptoms.  Let your doctor know about them.  This condition may be treated with a sling, ice, and pain medicine.  Contact your doctor if the pain gets worse or you have new pain. Get help right away if your arm, hand, or fingers tingle or get numb, swollen, or painful.  Keep all follow-up visits as told by your doctor. This is important. This information is not intended to replace advice given to you by your health care provider. Make sure you discuss any questions you have with your health care provider. Document Released: 07/17/2007 Document Revised: 08/12/2017 Document Reviewed: 08/12/2017 Elsevier Patient Education  2020 [employer] Inc.  

## 2018-12-16 NOTE — Progress Notes (Signed)
   Subjective:    Patient ID: Blake Arnold, male    DOB: Jun 12, 1970, 48 y.o.   MRN: [id number]   CC: Shoulder pain   HPI: Shoulder pain Patient presenting for worsening right shoulder pain. States that he has had shoulder pain for some time now but has been progressively worsening x2 months. Reports that 2 days ago he slipped on a leaf and fell into a door which worsened the pain acutely. Reports that today the shoulder pain was unbearable which is why he came in. Reports pain as shooting down his arm to his third, fourth and fifth fingers. Reports he has had previous C spine surgery (fusion of C5, 6, 7) and has had pain since. Patient had autograft spine surgery in 2019.  Also cervical discectomy in 2019.  Visit note from [facility] on 11/30/2018 showing that a cervical spine x-ray was done and showed no instability but a screw that was broken but unchanged in position. Has been trying to have f/u appointment with [facility] neurosurgery as this is closer but has been having difficulty with insurance approval. Reports neck pain as well. Reports a history of neuropathy. Does report some weakness and dropping objects with that hand.   Tobacco abuse Patient is a daily smoker. Reports interest in quitting. Would like resources.    Objective:  BP 140/80   Pulse 89   Wt 256 lb (116.1 kg)   SpO2 97%   BMI 38.92 kg/m  Vitals and nursing note reviewed  General: well nourished, in no acute distress HEENT: normocephalic  Neck: supple, non-tender, without lymphadenopathy, limited ROM 2/2 pain  Cardiac: Regular rate  Respiratory: no increased work of breathing Extremities: no edema or cyanosis. Warm, well perfused. 2+ radial and PT pulses bilaterally. Limited ROM exam 2/2 pain. 20 deg flexion and extension. 20 deg abduction. Pain with any movement or palpation.  Skin: warm and dry, no rashes noted Neuro: alert and oriented, no focal deficits   Assessment & Plan:    Shoulder pain Patient  with acutely worsening shoulder pain. Reports pain worsened after injury. Can consider possible fracture given traumatic event worsening pain. Will order xray imaging to rule out shoulder dislocation, fracture, or clavicle fracture. Given that patient is also having chronic shoulder pain which appears neuropathic related to his spine surgery I advised f/u with neurosurgery. Have placed referral so he can be seen in [place]. Advised tylenol/ibuprofen for pain. If needed can give RX for gabapentin. Patient has h/o substance abuse so will try and avoid narcotics if possible. F/u in 1 month if no improvement.   TOBACCO ABUSE Patient desires cessation. Congratulated patient on this decision. Advised to f/u with Dr. Valentina Lucks for tobacco cessation and possible chantix. Gave information for [place] hotline so patient can seek counseling if desired. F/u with PCP    Return in about 4 weeks (around 01/13/2019) for 1 week f/u with Dr. Valentina Lucks for tobacco cessation.   Caroline More, DO, PGY-3

## 2018-12-16 NOTE — Unmapped (Signed)
Spoke with patient, patient wanted to be seen by Orthopedic in [place]. Patient requested for Dr.Lim to send a referral. RN gave phone number for medical records and advised patient to ask PCP for referral.

## 2018-12-17 ENCOUNTER — Ambulatory Visit
Admission: RE | Admit: 2018-12-17 | Discharge: 2018-12-17 | Disposition: A | Discharge status: Home / Self Care | Payer: Medicaid Other | Source: Ambulatory Visit | Attending: Family Medicine | Admitting: Family Medicine

## 2018-12-17 ENCOUNTER — Encounter: Payer: Self-pay | Admitting: Family Medicine

## 2018-12-17 DIAGNOSIS — M25519 Pain in unspecified shoulder: Secondary | ICD-10-CM | POA: Insufficient documentation

## 2018-12-17 DIAGNOSIS — M25511 Pain in right shoulder: Secondary | ICD-10-CM

## 2018-12-17 NOTE — Assessment & Plan Note (Addendum)
Patient with acutely worsening shoulder pain. Reports pain worsened after injury. Can consider possible fracture given traumatic event worsening pain. Will order xray imaging to rule out shoulder dislocation, fracture, or clavicle fracture. Given that patient is also having chronic shoulder pain which appears neuropathic related to his spine surgery I advised f/u with neurosurgery. Have placed referral so he can be seen in [place]. Advised tylenol/ibuprofen for pain. If needed can give RX for gabapentin. Patient has h/o substance abuse so will try and avoid narcotics if possible. F/u in 1 month if no improvement.

## 2018-12-17 NOTE — Assessment & Plan Note (Signed)
Patient desires cessation. Congratulated patient on this decision. Advised to f/u with Dr. Valentina Lucks for tobacco cessation and possible chantix. Gave information for [place] hotline so patient can seek counseling if desired. F/u with PCP

## 2018-12-21 ENCOUNTER — Telehealth: Payer: Self-pay | Admitting: *Deleted

## 2018-12-21 MED FILL — METHOTREXATE SODIUM 25 MG/ML INJECTION SOLUTION: SUBCUTANEOUS | 84 days supply | Qty: 12 | Fill #2

## 2018-12-21 MED FILL — METHOTREXATE SODIUM 25 MG/ML INJECTION SOLUTION: 84 days supply | Qty: 12 | Fill #2 | Status: AC

## 2018-12-21 NOTE — Telephone Encounter (Signed)
Pt informed. Deseree Blount, CMA  

## 2018-12-21 NOTE — Telephone Encounter (Signed)
-----   Message from Caroline More, DO sent at 12/18/2018 10:18 AM EST ----- Please inform patient that results of shoulder xray are negative.

## 2018-12-24 ENCOUNTER — Encounter: Payer: Self-pay | Admitting: Pharmacist

## 2018-12-24 ENCOUNTER — Ambulatory Visit (INDEPENDENT_AMBULATORY_CARE_PROVIDER_SITE_OTHER): Payer: Medicaid Other | Admitting: Pharmacist

## 2018-12-24 ENCOUNTER — Other Ambulatory Visit: Payer: Self-pay

## 2018-12-24 DIAGNOSIS — F17200 Nicotine dependence, unspecified, uncomplicated: Secondary | ICD-10-CM

## 2018-12-24 MED ORDER — PROAIR RESPICLICK 108 (90 BASE) MCG/ACT IN AEPB: 2.0000 | INHALATION_SPRAY | RESPIRATORY_TRACT | 0 refills | Status: DC | PRN

## 2018-12-24 MED ORDER — CHANTIX STARTING MONTH PAK 0.5 MG X 11 & 1 MG X 42 PO TABS: ORAL_TABLET | ORAL | 0 refills | Status: DC

## 2018-12-24 MED ORDER — NICOTINE 21 MG/24HR TD PT24: 21.0000 mg | MEDICATED_PATCH | Freq: Every day | TRANSDERMAL | 1 refills | Status: DC

## 2018-12-24 NOTE — Patient Instructions (Addendum)
It was a pleasure seeing you in clinic today, Blake Arnold!  Today the plan is... 1. Start varenicline (Chantix)  0.5 mg by mouth once daily with food x3 days, then 0.5 mg by mouth twice daily with food x4 days, then 1 mg by mouth twice daily with food thereafter.  2. Start nicotine patch 21 mg daily.   3. Start doing yardwork in front of the house or chores in the house. If you get a craving to smoke eat a hard candy (cherr/strawberry flavored)  4. Buy menthols instead of L&M  5. Call 1-800-QUIT-NOW [phone]) if you would like to talk to additional person about smoking cessation.   Please call pharmacy team (Dr. Nicholas Lose or Dr. Drexel Iha) at [phone] if you have any questions/concerns

## 2018-12-24 NOTE — Progress Notes (Signed)
Reviewed: Agree with Dr. Koval's documentation and management. 

## 2018-12-24 NOTE — Progress Notes (Signed)
S:  Patient arrives ambulating without assistance and in good spirits.   Patient arrives for evaluation/assistance with tobacco dependence.   Patient was referred and last seen by Primary Care Provider on 12/16/2018.   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  Daily schedule: wake up in the morning at 5AM, smokes 1/2 pack between 5-9AM, smokes the rest at 4PM, and opens 2nd pack at [facility] and smoke the rest of the night.  -Patient reports most smoking is done while doing yard work and while driving.  Age when started using tobacco on a daily basis: 48 years old Brand smoked: L&M Red Full Flavored 100s. Number of cigarettes/day 20-30. Estimated nicotine content per cigarette (mg) 0.9-1mg  Estimated nicotine intake per day 36-40mg .   Smokes 0-1 times per night (not recently as he has tapered down slightly). Patient states he would wake up to go to the bathroom and would have a cigarette in the early morning, but has not done so in the past month since deciding to quit.   Doesn't smoke in the house.  Fagerstrom Score Question Scoring Patient Score  How soon after waking do you smoke your first cigarette? <5 mins (3) 5-30 mins (2) 31-60 mins (1) >60 mins (0) 1 (previously higher)  Do you find it difficult NOT to smoke in places where you shouldn't? Yes(1) No (0) 1  Which cigarette would you most hate to give up? First one in AM (1)  Any other one (0)    0  How many cigarettes do you smoke/day? 10 or less (0) 11-20 (1) 21-30 (2) >30 (3) 3  Do you smoke more during the first few hours after waking? Yes (1) No (0) 1  Do you smoke if you are so ill you cannot get out of bed? Yes (1) No (0) 0   Total Score 6  Score interpretation: low 1-2, low-to-moderate 3-4, moderate 5-7, high >7  Most recent quit attempt ~2018 -tried bupropion (cigs tasted nasty); psychiatrist took him off bupropion and put him on venlafaxine Longest time ever been tobacco free 14 days.   Medications used in past cessation efforts include: NRT (patches, gum/lozenge; would catch himself smoking), bupropion (psychiatrist changed medication).  Rates IMPORTANCE of quitting tobacco on 1-10 scale of 10 Rates CONFIDENCE of quitting tobacco on 1-10 scale of 10  Most common triggers to use tobacco include: habit, boredom   Motivation to quit: condition his dad is in, family member died from smoking, sister almost died from smoking, wants to start exercising    Clinical ASCVD: No  The 10-year ASCVD risk score Mikey Bussing DC Brooke Bonito., et al., 2013) is: 8.5%   Values used to calculate the score:     Age: 48 years     Sex: Male     Is Non-Hispanic African American: No     Diabetic: No     Tobacco smoker: Yes     Systolic Blood Pressure: [id number] mmHg     Is BP treated: Yes     HDL Cholesterol: 42 mg/dL     Total Cholesterol: 174 mg/dL  Chantix and the patch  Quit day: 10 days from now  Call next week  A/P: 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   - Trigger plan: Going switch from doing yardwork in the back of the house to the front of the  house; also doing chores around the house  - 4 cigarettes/buy a brand you think is nasty    -Initiated nicotine replacement tx with 21mg  nicotine patch. Patient counseled on purpose, proper use, and potential adverse effects, including itching/rash management and change in insomnia/dreams.    -Initiated varenicline 0.5 mg by mouth once daily with food x3 days, then 0.5 mg by mouth twice daily with food x4 days, then 1 mg by mouth twice daily with food thereafter. Patient counseled on purpose, proper use, and potential adverse effects, including GI upset/nausea.  -Provided information on 1 800-QUIT NOW support program.   Chronic Bronchiolitis - managed with albuterol PRN.  Requested refill - provided.   Consider PFT in the future (post-covid).   Written information provided.  F/U phone call 11/19.   Total time in face-to-face counseling 35 minutes.  Patient seen with Simonne Come, PharmD Candidate,  and Drexel Iha, PharmD,  PGY2 Pharmacy Resident.   [name]

## 2018-12-24 NOTE — Assessment & Plan Note (Signed)
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   - Trigger plan: Going switch from doing yardwork in the back of the house to the front of the house; also doing chores around the house  - 4 cigarettes/buy a brand you think is nasty    -Initiated nicotine replacement tx with 21mg  nicotine patch. Patient counseled on purpose, proper use, and potential adverse effects, including itching/rash management and change in insomnia/dreams.    -Initiated varenicline 0.5 mg by mouth once daily with food x3 days, then 0.5 mg by mouth twice daily with food x4 days, then 1 mg by mouth twice daily with food thereafter. Patient counseled on purpose, proper use, and potential adverse effects, including GI upset/nausea.  -Provided information on 1 800-QUIT NOW support program.

## 2018-12-31 ENCOUNTER — Telehealth: Payer: Self-pay | Admitting: Pharmacist

## 2018-12-31 NOTE — Telephone Encounter (Signed)
Patient contacted to follow-up on tobacco cessation.  He reports using Chantix (varenicline) without major side effects.  He reports "hilarious" dreams and was not concerned about them.  He just started the 21mg  nicotine patch yesterday.    Continues to smoke ~ 1 PPD at this time.   Plans to increase to 1mg  BID varenicline tomorrow.    Encouraged continued success with intake reduction.  Plan to follow-up in 2 weeks by phone.

## 2018-12-31 NOTE — Telephone Encounter (Signed)
Noted and agree. 

## 2019-01-13 ENCOUNTER — Telehealth: Payer: Self-pay | Admitting: Pharmacist

## 2019-01-13 NOTE — Telephone Encounter (Signed)
Contacted patient RE tobacco cessation.   He shared that he has reduced use of cigarettes from 1ppd to 1/2 ppd and notes that he only puffs on each cigarette a few times before he discards it.  He notes that driving is his most difficult trigger.  He also shared that the patches did not stick to his skin.   We discussed methods to improve skin adhesion with patch (wiping skin clean with alcohol) or taping patch edges down with medical tape.   We also discussed a variety of techniques which may help with smoking while driving.  He will try using a straw in his mouth while driving.   I plan follow-up in ~ 1 week.  He should likely need a refill (new Rx for varenicline at that time).

## 2019-01-13 NOTE — Telephone Encounter (Signed)
Noted and agree. 

## 2019-01-18 ENCOUNTER — Encounter: Payer: Self-pay | Admitting: Family Medicine

## 2019-01-18 ENCOUNTER — Other Ambulatory Visit: Payer: Self-pay

## 2019-01-18 ENCOUNTER — Ambulatory Visit (INDEPENDENT_AMBULATORY_CARE_PROVIDER_SITE_OTHER): Payer: Medicaid Other | Admitting: Family Medicine

## 2019-01-18 VITALS — BP 148/82 | HR 87 | Wt 258.2 lb

## 2019-01-18 DIAGNOSIS — M25511 Pain in right shoulder: Secondary | ICD-10-CM | POA: Diagnosis not present

## 2019-01-18 MED ORDER — MELOXICAM 15 MG PO TABS: 15.0000 mg | ORAL_TABLET | Freq: Every day | ORAL | 1 refills | Status: DC

## 2019-01-18 NOTE — Patient Instructions (Signed)
It was a pleasure seeing you today.   Today we discussed your shoulder pain  For your pain: I started a new medicine called meloxicam 15 mg daily.  I would take this first thing in the morning with breakfast.  You should also continue to take the gabapentin throughout the day and can use Tylenol as needed.  I strongly urge you to follow-up with neurosurgery and other [facility] or [facility] if you can get in.  I would recommend you request records from [facility] and ask if they can send them to [place].  Please follow up in 1 month if no improvement or sooner if symptoms persist or worsen. Please call the clinic immediately if you have any concerns.   Our clinic's number is [phone]. Please call with questions or concerns.   Please go to the emergency room if chest pain or shortness of breath  Thank you,  Caroline More, DO

## 2019-01-18 NOTE — Assessment & Plan Note (Signed)
Patient continues have worsening pain.  I advised that he try and continue to move his shoulder and work with physical therapy to help prevent frozen shoulder.  Patient warned of symptoms of frozen shoulder.  Have placed referral to physical therapy.  Given that he has increased pain will give meloxicam 50 mg daily.  Advised to use gabapentin as well as Tylenol as needed.  Patient should follow-up with neurosurgery as this is the only way he can truly solve his neuropathic pain secondary to his spine surgery.  I advised him to follow-up with [facility] if he is unable to get a referral to [facility] neurosurgeon.  Patient can also request [facility] to send [place] records so that we may review them.

## 2019-01-18 NOTE — Progress Notes (Signed)
   Subjective:    Patient ID: Blake Arnold, male    DOB: 1970-02-23, 48 y.o.   MRN: [id number]   CC: Shoulder pain.  HPI: Shoulder pain Recently seen on 11/4 by me.  At that time was recommended to take gabapentin and follow-up with neurosurgeon.  Unfortunately patient was unable to see neurosurgeon as they have had difficulty with obtaining release of records from previous neurosurgeon and they refused to see patient until they have these records.  Patient states that his spine and neck pain has been symptomatic for 2 years.  Pain is worse in the past 3 to 4 months.  In the past 3 days it is significantly worse.  Has been taking gabapentin but this did not work.  States that sometimes his pain shoots down his right leg.  Denies any weakness.  Has a friend who told him to try naltrexone.  Patient was unsure of this as he previously used this when he was trying to abstain from alcohol and drug use.  Elevated PHQ 2 PHQ 2 elevated so PHQ-9 was obtained.  This was elevated as well.  States that he is already seeing a therapist and a psychiatrist and does not want to follow-up at [facility]  Objective:  BP (!) 148/82   Pulse 87   Wt 258 lb 3.2 oz (117.1 kg)   SpO2 97%   BMI 39.26 kg/m  Vitals and nursing note reviewed  General: well nourished, in no acute distress HEENT: normocephalic Neck: supple, tender in posterior spine  Cardiac: Regular rate Respiratory: no increased work of breathing Extremities: no edema or cyanosis. Warm, well perfused.  Grip strength.  Able to pull and push normally.  Limited range of motion right upper extremity secondary to pain. Skin: warm and dry, no rashes noted Neuro: alert and oriented, no focal deficits   Assessment & Plan:    Shoulder pain Patient continues have worsening pain.  I advised that he try and continue to move his shoulder and work with physical therapy to help prevent frozen shoulder.  Patient warned of symptoms of frozen shoulder.  Have  placed referral to physical therapy.  Given that he has increased pain will give meloxicam 50 mg daily.  Advised to use gabapentin as well as Tylenol as needed.  Patient should follow-up with neurosurgery as this is the only way he can truly solve his neuropathic pain secondary to his spine surgery.  I advised him to follow-up with [facility] if he is unable to get a referral to [facility] neurosurgeon.  Patient can also request [facility] to send [place] records so that we may review them.    Return in about 4 weeks (around 02/15/2019).   Caroline More, DO, PGY-3

## 2019-01-21 ENCOUNTER — Telehealth: Payer: Self-pay | Admitting: Pharmacist

## 2019-01-21 DIAGNOSIS — F17200 Nicotine dependence, unspecified, uncomplicated: Secondary | ICD-10-CM

## 2019-01-21 MED ORDER — NICOTINE 21 MG/24HR TD PT24: 21.0000 mg | MEDICATED_PATCH | Freq: Every day | TRANSDERMAL | 1 refills | Status: DC

## 2019-01-21 MED ORDER — VARENICLINE TARTRATE 1 MG PO TABS: 1.0000 mg | ORAL_TABLET | Freq: Two times a day (BID) | ORAL | 3 refills | Status: DC

## 2019-01-21 NOTE — Telephone Encounter (Signed)
Phone call follow-up RE tobacco cessation.    Patient reports continued use of 10 cigarettes per day.   Patient reports use of Chantix without any side effects.  Patient has used nicotine patches with more success (using alcohol on skin prior to application has helped).  He states he lost the box of his patches but found them again last night and will restart.   After some conversation, patient committed to a quit date of 12/17 (1 week from now).    No change in therapy at this time.  Continue both varenicline 1mg  BID with food and nicotine patches at 21mg  daily.    I plan to follow-up 12/18 (day after quit date) to assess status.

## 2019-01-21 NOTE — Telephone Encounter (Signed)
Noted and agree. 

## 2019-02-03 ENCOUNTER — Telehealth: Payer: Self-pay | Admitting: Pharmacist

## 2019-02-03 MED ORDER — VARENICLINE TARTRATE 1 MG PO TABS: 1.0000 mg | ORAL_TABLET | Freq: Every day | ORAL | 3 refills | Status: DC

## 2019-02-03 NOTE — Telephone Encounter (Signed)
Patient returned call RE tobacco cessation.  He states he is having gas and flatulence.  He wonders if this is likely due to the Varenicline (Chantix).  He notices GI side effects including gas and flatulence are listed in the product labeling.   He reports continued smoking of 2-3 "partial" cigarettes each day.  He desires to completely quit smoking in the near future. He did not commit to a quit date during our call.   After a discussion of goals we agreed to REDUCE the dose of Chantix from 1 pill BID to 1 pill DAILY with largest meal.  He verbalized understanding of the plan.   I plan to call him in 2 weeks (after the holidays) to reassess.

## 2019-02-06 ENCOUNTER — Ambulatory Visit (HOSPITAL_COMMUNITY)
Admission: EM | Admit: 2019-02-06 | Discharge: 2019-02-06 | Disposition: A | Discharge status: Home / Self Care | Payer: Medicaid Other | Attending: Emergency Medicine | Admitting: Emergency Medicine

## 2019-02-06 ENCOUNTER — Encounter (HOSPITAL_COMMUNITY): Payer: Self-pay

## 2019-02-06 DIAGNOSIS — R1084 Generalized abdominal pain: Secondary | ICD-10-CM | POA: Diagnosis present

## 2019-02-06 DIAGNOSIS — K921 Melena: Secondary | ICD-10-CM | POA: Insufficient documentation

## 2019-02-06 LAB — CBC
HCT: 50.8 % (ref 39.0–52.0)
Hemoglobin: 17.6 g/dL — ABNORMAL HIGH (ref 13.0–17.0)
MCH: 33.8 pg (ref 26.0–34.0)
MCHC: 34.6 g/dL (ref 30.0–36.0)
MCV: 97.7 fL (ref 80.0–100.0)
Platelets: 211 10*3/uL (ref 150–400)
RBC: 5.2 MIL/uL (ref 4.22–5.81)
RDW: 12.6 % (ref 11.5–15.5)
WBC: 11 10*3/uL — ABNORMAL HIGH (ref 4.0–10.5)
nRBC: 0 % (ref 0.0–0.2)

## 2019-02-06 LAB — COMPREHENSIVE METABOLIC PANEL
ALT: 46 U/L — ABNORMAL HIGH (ref 0–44)
AST: 24 U/L (ref 15–41)
Albumin: 3.8 g/dL (ref 3.5–5.0)
Alkaline Phosphatase: 50 U/L (ref 38–126)
Anion gap: 7 (ref 5–15)
BUN: 17 mg/dL (ref 6–20)
CO2: 25 mmol/L (ref 22–32)
Calcium: 8.7 mg/dL — ABNORMAL LOW (ref 8.9–10.3)
Chloride: 105 mmol/L (ref 98–111)
Creatinine, Ser: 1.17 mg/dL (ref 0.61–1.24)
GFR calc Af Amer: >60 mL/min (ref >60)
GFR calc non Af Amer: >60 mL/min (ref >60)
Glucose, Bld: 109 mg/dL — ABNORMAL HIGH (ref 70–99)
Potassium: 4.1 mmol/L (ref 3.5–5.1)
Sodium: 137 mmol/L (ref 135–145)
Total Bilirubin: 0.7 mg/dL (ref 0.3–1.2)
Total Protein: 7.2 g/dL (ref 6.5–8.1)

## 2019-02-06 LAB — LIPASE, BLOOD: Lipase: 35 U/L (ref 11–51)

## 2019-02-06 MED ORDER — HYDROCORTISONE ACETATE 25 MG RE SUPP: 25.0000 mg | Freq: Two times a day (BID) | RECTAL | 0 refills | Status: DC

## 2019-02-06 MED ORDER — DICYCLOMINE HCL 20 MG PO TABS: 20.0000 mg | ORAL_TABLET | Freq: Three times a day (TID) | ORAL | 0 refills | Status: DC

## 2019-02-06 NOTE — ED Triage Notes (Signed)
Pt states he saw blood in his stools 2 weeks ago; yesterday and today. Pt states having abdominal pain on and off x 2 weeks.

## 2019-02-06 NOTE — Discharge Instructions (Signed)
I am checking blood work; I will call if this is abnormal May try bentyl for abdominal pain Use Anusol suppositories and insert rectally twice daily Continue to monitor bleeding  Please follow-up with gastroenterology for further evaluation of abdominal pain and bleeding  If you develop worsening abdominal pain, persistent or worsening bleeding or developing lightheadedness and dizziness please follow-up in the emergency room

## 2019-02-07 NOTE — ED Provider Notes (Signed)
[facility]    CSN: [id number] Arrival date & time: 02/06/19  1100      History   Chief Complaint Chief Complaint  Patient presents with  . Abdominal Pain  . Blood In Stools    HPI CHADERICK NABA is a 48 y.o. male history of hypertension, hyperlipidemia, prior alcohol abuse, presenting today for evaluation of abdominal pain and blood in stools.  Patient states that for the past 2 weeks he has had generalized abdominal discomfort.  Initially symptoms were coming and going, but for the past 3 days pain has been constant.  Describes pain as sharp.  He also notes that 2 weeks ago he noticed some blood in the stool.  He states that he was straining earlier in the day and in the past has had some blood in stool with straining.  Today he has gone to the bathroom 3 times and has noticed bright red blood again with each bowel movement.  He denies any recent strenuous activity over the past couple of days.  He denies any associated rectal pain.  Typical bowel pattern is twice a day, denies straining with bowels.  States that he does often take ibuprofen 3 times a day.  Denies any recent alcohol use, states that he has probably had 3 drinks in the past 6 months.  Believes his grandfather passed away from colon cancer, has never had a colonoscopy.  Mom has history of bladder cancer.  He denies any nausea or vomiting.  Has been able to eat and drink like normal.  Has had prior appendectomy, and was noted to have 2 small hernias at this time.  Has never had these fixed.  HPI  Past Medical History:  Diagnosis Date  . A-fib ([place])   . Acid reflux   . Alcohol abuse   . Anxiety   . Asthma   . AVN (avascular necrosis of bone) ([place])   . Chronic back pain   . Depression   . Hepatitis C    history of  . History of urinary frequency   . Hypertension   . Peripheral neuropathy    hands and feet  . Polysubstance abuse ([place]) 01/25/2011  . 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[place])   . Rheumatoid arteritis  ([place])   . Seizures [facility])     Patient Active Problem List   Diagnosis Date Noted  . Shoulder pain 12/17/2018  . Hyperlipidemia 09/01/2018  . HTN (hypertension) 07/24/2017  . Seizure ([place]) 07/24/2017  . RA (rheumatoid arthritis) ([place]) 07/24/2017  . Appendicitis 07/23/2017  . Alcohol abuse with alcohol-induced mood disorder ([place]) 07/26/2013  . Bipolar disorder, unspecified ([place]) 10/28/2012  . Alcohol dependence ([place]) 04/02/2012  . Alcohol withdrawal ([place]) 04/02/2012  . Major depression 04/02/2012  . Legal problem 04/22/2011  . Polysubstance abuse ([place]) 01/25/2011  . Bronchiolitis 01/25/2011  . Alcohol abuse 01/22/2011  . Chest pain 01/22/2011  . TOBACCO ABUSE 02/09/2010  . SUBSTANCE ABUSE 02/09/2010  . FIBRILLATION, ATRIAL 06/08/2009    Past Surgical History:  Procedure Laterality Date  . APPENDECTOMY    . CARDIOVERSION  03/07/2006  . CARPAL TUNNEL RELEASE    . CERVICAL FUSION    . LAPAROSCOPIC APPENDECTOMY N/A 07/23/2017   Procedure: APPENDECTOMY LAPAROSCOPIC;  Surgeon: Georganna Skeans, MD;  Location: [facility];  Service: General;  Laterality: N/A;       Home Medications    Prior to Admission medications   Medication Sig Start Date End Date Taking? Authorizing Provider  ibuprofen (ADVIL) 200 MG  tablet Take 200 mg by mouth every 6 (six) hours as needed.   Yes [provider]  acetaminophen (TYLENOL) 325 MG tablet Take 650 mg by mouth 3 (three) times daily.    [provider]  Adalimumab (HUMIRA) 40 MG/0.8ML PSKT Inject 0.8 mLs (40 mg total) into the skin once a week. tuesdays Patient taking differently: Inject 0.8 mLs into the skin once a week. Sunday 08/05/17 08/05/18  Saverio Danker, PA-C  Albuterol Sulfate (PROAIR RESPICLICK) 123XX123 (90 Base) MCG/ACT AEPB Inhale 2 puffs into the lungs every 4 (four) hours as needed (wheezing; cough). 12/24/18   Zenia Resides, MD  Albuterol Sulfate (PROAIR RESPICLICK) 123XX123 (90 Base) MCG/ACT AEPB Inhale 2 puffs into the lungs  every 4 (four) hours as needed (wheezing; cough). 12/24/18   Zenia Resides, MD  amLODipine (NORVASC) 5 MG tablet Take 5 mg by mouth at bedtime. 07/14/17   [provider]  baclofen (LIORESAL) 10 MG tablet Take 10 mg by mouth 3 (three) times daily.    [provider]  benztropine (COGENTIN) 0.5 MG tablet Take 0.5 mg by mouth 2 (two) times daily.    [provider]  diclofenac Sodium (VOLTAREN) 1 % GEL Apply 1 application topically 3 (three) times daily.    [provider]  dicyclomine (BENTYL) 20 MG tablet Take 1 tablet (20 mg total) by mouth 4 (four) times daily -  before meals and at bedtime. 02/06/19   Willadeen Colantuono C, PA-C  FOLIC ACID PO Take by mouth.    [provider]  gabapentin (NEURONTIN) 400 MG capsule Take 1 capsule (400 mg total) by mouth 3 (three) times daily. 11/06/18   Matilde Haymaker, MD  hydrochlorothiazide (HYDRODIURIL) 25 MG tablet Take 25 mg by mouth daily.    [provider]  hydrocortisone (ANUSOL-HC) 25 MG suppository Place 1 suppository (25 mg total) rectally 2 (two) times daily. 02/06/19   Terressa Evola C, PA-C  meloxicam (MOBIC) 15 MG tablet Take 1 tablet (15 mg total) by mouth daily. 01/18/19   Caroline More, DO  Methotrexate, Anti-Rheumatic, (METHOTREXATE, PF, [place]) Inject 1 mL into the skin once a week.     [provider]  metoprolol tartrate (LOPRESSOR) 50 MG tablet Take 50 mg by mouth 2 (two) times daily. 07/14/17   [provider]  nicotine (NICOTINE STEP 1) 21 mg/24hr patch Place 1 patch (21 mg total) onto the skin daily. 01/21/19   Zenia Resides, MD  ranitidine (ZANTAC) 150 MG tablet Take 150 mg by mouth 2 (two) times daily.    [provider]  simvastatin (ZOCOR) 40 MG tablet Take 40 mg by mouth daily. 09/25/18   [provider]  Turmeric 500 MG CAPS Take 1 capsule by mouth daily. 08/24/18   [provider]  varenicline (CHANTIX) 1 MG tablet Take 1 tablet (1 mg  total) by mouth daily with supper. 02/03/19   Zenia Resides, MD  venlafaxine (EFFEXOR) 25 MG tablet Take 25 mg by mouth 2 (two) times daily.    [provider]    Family History Family History  Problem Relation Age of Onset  . Heart attack Mother   . Atrial fibrillation Mother   . Skin cancer Father     Social History Social History   Tobacco Use  . Smoking status: Current Every Day Smoker    Packs/day: 2.00    Years: 34.00    Pack years: 68.00    Types: Cigarettes    Start  date: 02/12/1984  . Smokeless tobacco: Former Systems developer    Quit date: 10/24/2012  . Tobacco comment: 1.5-2ppd  Quit attempt 12/24/2018  Substance Use Topics  . Alcohol use: Not Currently  . Drug use: Not Currently    Types: "Crack" cocaine, Cocaine    Comment: last use 07/26/13     Allergies   Lisinopril   Review of Systems Review of Systems  Constitutional: Negative for activity change, appetite change and fever.  HENT: Negative for sore throat.   Respiratory: Negative for shortness of breath.   Cardiovascular: Negative for chest pain.  Gastrointestinal: Positive for abdominal pain and blood in stool. Negative for nausea, rectal pain and vomiting.  Genitourinary: Negative for difficulty urinating, discharge, dysuria, frequency, penile pain, penile swelling, scrotal swelling and testicular pain.  Skin: Negative for rash.  Neurological: Negative for dizziness, light-headedness and headaches.     Physical Exam Triage Vital Signs ED Triage Vitals  Enc Vitals Group     BP 02/06/19 1247 133/87     Pulse Rate 02/06/19 1247 86     Resp 02/06/19 1247 16     Temp 02/06/19 1247 98.6 F (37 C)     Temp Source 02/06/19 1247 Oral     SpO2 02/06/19 1247 96 %     Weight --      Height --      Head Circumference --      Peak Flow --      Pain Score 02/06/19 1245 8     Pain Loc --      Pain Edu? --      Excl. in [place]? --    No data found.  Updated Vital Signs BP 133/87 (BP Location: Left  Arm)   Pulse 86   Temp 98.6 F (37 C) (Oral)   Resp 16   SpO2 96%   Visual Acuity Right Eye Distance:   Left Eye Distance:   Bilateral Distance:    Right Eye Near:   Left Eye Near:    Bilateral Near:     Physical Exam Vitals and nursing note reviewed.  Constitutional:      Appearance: He is well-developed.  HENT:     Head: Normocephalic and atraumatic.  Eyes:     Conjunctiva/sclera: Conjunctivae normal.  Cardiovascular:     Rate and Rhythm: Normal rate and regular rhythm.     Heart sounds: No murmur.  Pulmonary:     Effort: Pulmonary effort is normal. No respiratory distress.     Breath sounds: Normal breath sounds.     Comments: Breathing comfortably at rest, CTABL, no wheezing, rales or other adventitious sounds auscultated Abdominal:     Palpations: Abdomen is soft.     Tenderness: There is no abdominal tenderness.     Comments: Abdomen mildly distended, but soft, denies tenderness throughout entire abdomen, more focal to right lower quadrant, negative rebound, negative Murphy's, negative Rovsing  Genitourinary:    Comments: No external hemorrhoids noted, nontender to palpation around external rectum, no significant tenderness palpated with DRE, no specific masses palpated, Hemoccult positive Musculoskeletal:     Cervical back: Neck supple.  Skin:    General: Skin is warm and dry.  Neurological:     Mental Status: He is alert.   Patient showed picture from last bowel movement which had significant amount of bright red blood throughout water   UC Treatments / Results  Labs (all labs ordered are listed, but only abnormal results are displayed) Labs Reviewed  CBC - Abnormal; Notable for the following components:      Result Value   WBC 11.0 (*)    Hemoglobin 17.6 (*)    All other components within normal limits  COMPREHENSIVE METABOLIC PANEL - Abnormal; Notable for the following components:   Glucose, Bld 109 (*)    Calcium 8.7 (*)    ALT 46 (*)    All  other components within normal limits  LIPASE, BLOOD  POC OCCULT BLOOD, ED    EKG   Radiology No results found.  Procedures Procedures (including critical care time)  Medications Ordered in UC Medications - No data to display  Initial Impression / Assessment and Plan / UC Course  I have reviewed the triage vital signs and the nursing notes.  Pertinent labs & imaging results that were available during my care of the patient were reviewed by me and considered in my medical decision making (see chart for details).    Abdominal exam negative for peritoneal signs, do not suspect abdominal emergency at this time, Hemoccult positive, will treat for internal hemorrhoids with Anusol suppositories, but recommending follow-up with gastroenterology for further evaluation given associated pain with the bleeding, possible candidate for colonoscopy to rule out underlying polyps/cancer.  Given blood is bright red, feel upper GI bleed from ulcer or NSAIDs less likely.  Checking abdominal blood work to ensure hemoglobin is stable as well as lipase and CMP.  Will call if abnormal.  If developing increased abdominal pain or persistent or worsening bleeding, lightheadedness or dizziness to follow-up in the emergency room.  Blood work with minor aberrations, will continue to monitor and have patient go to emergency room if symptoms progressing/worsening; otherwise plan to follow-up with gastroenterology.  Discussed strict return precautions. Patient verbalized understanding and is agreeable with plan.  Final Clinical Impressions(s) / UC Diagnoses   Final diagnoses:  Generalized abdominal pain  Hematochezia     Discharge Instructions     I am checking blood work; I will call if this is abnormal May try bentyl for abdominal pain Use Anusol suppositories and insert rectally twice daily Continue to monitor bleeding  Please follow-up with gastroenterology for further evaluation of abdominal pain and  bleeding  If you develop worsening abdominal pain, persistent or worsening bleeding or developing lightheadedness and dizziness please follow-up in the emergency room    ED Prescriptions    Medication Sig Dispense Auth. Provider   hydrocortisone (ANUSOL-HC) 25 MG suppository Place 1 suppository (25 mg total) rectally 2 (two) times daily. 16 suppository Salma Walrond C, PA-C   dicyclomine (BENTYL) 20 MG tablet Take 1 tablet (20 mg total) by mouth 4 (four) times daily -  before meals and at bedtime. 20 tablet Celisse Ciulla, [place] C, PA-C     PDMP not reviewed this encounter.   Janith Lima, [place] 02/07/19 [phone]

## 2019-02-08 LAB — POC OCCULT BLOOD, ED: Fecal Occult Bld: POSITIVE — AB

## 2019-02-08 LAB — OCCULT BLOOD, POC DEVICE: Fecal Occult Bld: POSITIVE — AB

## 2019-02-08 NOTE — Telephone Encounter (Signed)
Noted and agree. 

## 2019-02-22 ENCOUNTER — Telehealth: Payer: Self-pay | Admitting: Pharmacist

## 2019-02-22 DIAGNOSIS — M059 Rheumatoid arthritis with rheumatoid factor, unspecified: Principal | ICD-10-CM

## 2019-02-22 DIAGNOSIS — Z79899 Other long term (current) drug therapy: Principal | ICD-10-CM

## 2019-02-22 DIAGNOSIS — F17200 Nicotine dependence, unspecified, uncomplicated: Secondary | ICD-10-CM

## 2019-02-22 MED ORDER — BUPROPION HCL ER (XL) 150 MG PO TB24: 150.0000 mg | ORAL_TABLET | Freq: Every day | ORAL | 0 refills | Status: DC

## 2019-02-22 NOTE — Telephone Encounter (Signed)
Contacted patient RE f/u of tobacco cessation attempt.    Patient shared that his youngest brother died over the past weekend due to heroin overdose.  He states he has returned to smoking ~ 1 ppd following this news.  He states that he drove with his father who is a smoker which exacerbated his cravings.   He reports that Varenicline caused facial rash/ "acne" which has resolved since stopping Varenicline (Chantix).   He requested a switch to bupropion therapy for assistance.  I agreed.  We reviewed  purpose, proper use and potential adverse effects of insomnia.  Following instruction patient verbalized understanding of treatment plan.  New Rx sent to [facility].   Follow-Up planned by phone for 10-14 days.

## 2019-02-22 NOTE — Telephone Encounter (Signed)
Noted and agree. 

## 2019-02-22 NOTE — Assessment & Plan Note (Signed)
Contacted patient RE f/u of tobacco cessation attempt.    Patient shared that his youngest brother died over the past weekend due to heroin overdose.  He states he has returned to smoking ~ 1 ppd following this news.  He states that he drove with his father who is a smoker which exacerbated his cravings.   He reports that Varenicline caused facial rash/ "acne" which has resolved since stopping Varenicline (Chantix).   He requested a switch to bupropion therapy for assistance.  I agreed.  We reviewed  purpose, proper use and potential adverse effects of insomnia.  Following instruction patient verbalized understanding of treatment plan.  New Rx sent to [facility].   Follow-Up planned by phone for 10-14 days.

## 2019-02-24 ENCOUNTER — Telehealth: Payer: Self-pay | Admitting: *Deleted

## 2019-02-24 NOTE — Telephone Encounter (Signed)
Per Medicaid Formulary 2 XL tablets daily exceeds the FDA recommendations.  Not sure if a PA will make a difference or not.   Dr. Valentina Lucks, Would it be possible to split the tablet in 1/2 for the first 2 weeks or should we try a PA?  Christen Bame, CMA

## 2019-02-25 ENCOUNTER — Ambulatory Visit (INDEPENDENT_AMBULATORY_CARE_PROVIDER_SITE_OTHER): Payer: Medicaid Other | Admitting: Family Medicine

## 2019-02-25 ENCOUNTER — Other Ambulatory Visit: Payer: Self-pay

## 2019-02-25 ENCOUNTER — Encounter: Payer: Self-pay | Admitting: Family Medicine

## 2019-02-25 VITALS — BP 138/90 | HR 88 | Wt 253.8 lb

## 2019-02-25 DIAGNOSIS — R1013 Epigastric pain: Secondary | ICD-10-CM

## 2019-02-25 LAB — POCT HEMOGLOBIN: Hemoglobin: 17.9 g/dL — AB (ref 11–14.6)

## 2019-02-25 MED ORDER — PANTOPRAZOLE SODIUM 40 MG PO TBEC: 40.0000 mg | DELAYED_RELEASE_TABLET | Freq: Every day | ORAL | 3 refills | Status: DC

## 2019-02-25 MED ORDER — BUPROPION HCL ER (XL) 150 MG PO TB24: 150.0000 mg | ORAL_TABLET | Freq: Every day | ORAL | 1 refills | Status: DC

## 2019-02-25 NOTE — Patient Instructions (Signed)
It was a pleasure meeting you today.  You were seen for abdominal pain.  Stop taking any medication that contains nonsteroidal anti-inflammatories, such as ibuprofen/Advil, diclofenac, meloxicam/Mobic, naproxen.  I will order a urea breath test to check for H. Pylori.  You will be notified with results if they are abnormal.  Start Protonix 40 mg 1 tablet a day.  If you have worsening symptoms please seek medical attention at the ED or urgent care.  Follow-up with your appointment with gastroenterology in February. Follow-up with your PCP as needed  Have a great day and stay safe Carollee Leitz MD  Gastroesophageal Reflux Disease, Adult Gastroesophageal reflux (GER) happens when acid from the stomach flows up into the tube that connects the mouth and the stomach (esophagus). Normally, food travels down the esophagus and stays in the stomach to be digested. With GER, food and stomach acid sometimes move back up into the esophagus. You may have a disease called gastroesophageal reflux disease (GERD) if the reflux:  Happens often.  Causes frequent or very bad symptoms.  Causes problems such as damage to the esophagus. When this happens, the esophagus becomes sore and swollen (inflamed). Over time, GERD can make small holes (ulcers) in the lining of the esophagus. What are the causes? This condition is caused by a problem with the muscle between the esophagus and the stomach. When this muscle is weak or not normal, it does not close properly to keep food and acid from coming back up from the stomach. The muscle can be weak because of:  Tobacco use.  Pregnancy.  Having a certain type of hernia (hiatal hernia).  Alcohol use.  Certain foods and drinks, such as coffee, chocolate, onions, and peppermint. What increases the risk? You are more likely to develop this condition if you:  Are overweight.  Have a disease that affects your connective tissue.  Use NSAID medicines. What are  the signs or symptoms? Symptoms of this condition include:  Heartburn.  Difficult or painful swallowing.  The feeling of having a lump in the throat.  A bitter taste in the mouth.  Bad breath.  Having a lot of saliva.  Having an upset or bloated stomach.  Belching.  Chest pain. Different conditions can cause chest pain. Make sure you see your doctor if you have chest pain.  Shortness of breath or noisy breathing (wheezing).  Ongoing (chronic) cough or a cough at night.  Wearing away of the surface of teeth (tooth enamel).  Weight loss. How is this treated? Treatment will depend on how bad your symptoms are. Your doctor may suggest:  Changes to your diet.  Medicine.  Surgery. Follow these instructions at home: Eating and drinking   Follow a diet as told by your doctor. You may need to avoid foods and drinks such as: ? Coffee and tea (with or without caffeine). ? Drinks that contain alcohol. ? Energy drinks and sports drinks. ? Bubbly (carbonated) drinks or sodas. ? Chocolate and cocoa. ? Peppermint and mint flavorings. ? Garlic and onions. ? Horseradish. ? Spicy and acidic foods. These include peppers, chili powder, curry powder, vinegar, hot sauces, and BBQ sauce. ? Citrus fruit juices and citrus fruits, such as oranges, lemons, and limes. ? Tomato-based foods. These include red sauce, chili, salsa, and pizza with red sauce. ? Fried and fatty foods. These include donuts, french fries, potato chips, and high-fat dressings. ? High-fat meats. These include hot dogs, rib eye steak, sausage, ham, and bacon. ? High-fat dairy  items, such as whole milk, butter, and cream cheese.  Eat small meals often. Avoid eating large meals.  Avoid drinking large amounts of liquid with your meals.  Avoid eating meals during the 2-3 hours before bedtime.  Avoid lying down right after you eat.  Do not exercise right after you eat. Lifestyle   Do not use any products that  contain nicotine or tobacco. These include cigarettes, e-cigarettes, and chewing tobacco. If you need help quitting, ask your doctor.  Try to lower your stress. If you need help doing this, ask your doctor.  If you are overweight, lose an amount of weight that is healthy for you. Ask your doctor about a safe weight loss goal. General instructions  Pay attention to any changes in your symptoms.  Take over-the-counter and prescription medicines only as told by your doctor. Do not take aspirin, ibuprofen, or other NSAIDs unless your doctor says it is okay.  Wear loose clothes. Do not wear anything tight around your waist.  Raise (elevate) the head of your bed about 6 inches (15 cm).  Avoid bending over if this makes your symptoms worse.  Keep all follow-up visits as told by your doctor. This is important. Contact a doctor if:  You have new symptoms.  You lose weight and you do not know why.  You have trouble swallowing or it hurts to swallow.  You have wheezing or a cough that keeps happening.  Your symptoms do not get better with treatment.  You have a hoarse voice. Get help right away if:  You have pain in your arms, neck, jaw, teeth, or back.  You feel sweaty, dizzy, or light-headed.  You have chest pain or shortness of breath.  You throw up (vomit) and your throw-up looks like blood or coffee grounds.  You pass out (faint).  Your poop (stool) is bloody or black.  You cannot swallow, drink, or eat. Summary  If a person has gastroesophageal reflux disease (GERD), food and stomach acid move back up into the esophagus and cause symptoms or problems such as damage to the esophagus.  Treatment will depend on how bad your symptoms are.  Follow a diet as told by your doctor.  Take all medicines only as told by your doctor. This information is not intended to replace advice given to you by your health care provider. Make sure you discuss any questions you have with your  health care provider. Document Revised: 08/06/2017 Document Reviewed: 08/06/2017 Elsevier Patient Education  [place].

## 2019-02-25 NOTE — Progress Notes (Signed)
  Patient Name: Blake Arnold Date of Birth: [birth year] Date of Visit: 02/25/19 PCP: [name], DO  Chief Complaint: pain in stomach  Subjective: Blake Arnold is a pleasant 49 y.o. with medical history significant for RA, Polysubstance abuse, Hep C, Anxiety, Acid Reflux  presenting today for abdominal pain.   Abdominal Pain Pt reports abdominal pain that has been ongoing for cuple of months.  He was started on Chantix and developed abdominal pain and diarrhea.  He has since stopped the Chantix about 2-3 weeks ago.  He was recently seen in the ED 12/26 for similar symptoms and bloody stool. He was referred to GI and has appointment for Feb 12.  He continues to have midepigastric pain.  He denies any nausea, vomiting or diarrhea, constipation.  Bloody stool has resolved.  Denies any decrease in appetite or weight loss.  He is a current smoker and diet consist of a lot of coke products.  He reports no NSAID use.  ROS: Per HPI.   I have reviewed the patient's medical, surgical, family, and social history as appropriate.  Vitals:   02/25/19 1009  BP: 138/90  Pulse: 88  SpO2: 97%      Abdominal pain Considered in differential Pancreatitis, H.Pylori, PUD. Recent ED work up negative for pancreatitis and given that symptoms remain similar low suspicion . Given diet high in acidic foods, tobbaco use and recent stress of loss of brother PUD more likely. -Urea Breath test  -Hbg  -Protonix 40mg  daily -Follow up with GI -Follow up with PCP as needed -Avoid NSAID containing products    Carollee Leitz, MD  [facility]

## 2019-02-25 NOTE — Addendum Note (Signed)
Addended by: Leavy Cella on: 02/25/2019 01:01 PM   Modules accepted: Orders

## 2019-02-25 NOTE — Telephone Encounter (Signed)
Communicated prescription sent for 150mg  XL daily

## 2019-02-27 LAB — H. PYLORI BREATH TEST: H pylori Breath Test: NEGATIVE

## 2019-02-28 DIAGNOSIS — R109 Unspecified abdominal pain: Secondary | ICD-10-CM | POA: Insufficient documentation

## 2019-02-28 NOTE — Assessment & Plan Note (Addendum)
Considered in differential Pancreatitis, H.Pylori, PUD. Recent ED work up negative for pancreatitis and given that symptoms remain similar low suspicion . Given diet high in acidic foods, tobbaco use and recent stress of loss of brother PUD more likely. -Urea Breath test  -Hbg  -Protonix 40mg  daily -Follow up with GI -Follow up with PCP as needed -Avoid NSAID containing products

## 2019-03-04 NOTE — Unmapped (Signed)
[facility] Clinical Assessment & Refill Coordination Note    Joseph Sandoval, DOB: 04/09/1970  Phone: [phone] (home) [phone] (work)    All above HIPAA information was verified with patient.     Was a Nurse, learning disability used for this call? No    Specialty Medication(s):   Inflammatory Disorders: Humira and methotrexate (injectable)     Current Outpatient Medications   Medication Sig Dispense Refill   ??? acetaminophen (TYLENOL) 500 MG tablet Take 1,000 mg by mouth every six (6) hours as needed for pain.     ??? albuterol HFA 90 mcg/actuation inhaler INHALE 2 PUFFS EVERY SIX (6) HOURS AS NEEDED FOR WHEEZING. 20.1 g 6   ??? albuterol HFA 90 mcg/actuation inhaler INHALE 2 PUFFS EVERY SIX (6) HOURS AS NEEDED FOR WHEEZING. 20.1 g 3   ??? amLODIPine (NORVASC) 5 MG tablet Take 10 mg by mouth daily.      ??? baclofen (LIORESAL) 10 MG tablet Take 10 mg by mouth Three (3) times a day.     ??? benztropine (COGENTIN) 0.5 MG tablet Take 0.5 mg by mouth Two (2) times a day.     ??? buPROPion (WELLBUTRIN XL) 150 MG 24 hr tablet Take 150 mg by mouth daily.     ??? DULoxetine (CYMBALTA) 20 MG capsule Take 1 capsule (20 mg total) by mouth nightly. 30 capsule 2   ??? empty container Misc Use as directed 1 each PRN   ??? HUMIRA PEN CITRATE FREE 40 MG/0.4 ML Inject the contents of 1 pen (40mg ) under the skin every 7 days. 12 each 3   ??? hydroCHLOROthiazide (HYDRODIURIL) 25 MG tablet Take 25 mg by mouth daily.  4   ??? methotrexate 25 mg/mL injection solution Inject 1 mL (25 mg total) under the skin every seven (7) days. 4 mL 11   ??? metoprolol tartrate (LOPRESSOR) 25 MG tablet Take 50 mg by mouth Two (2) times a day.      ??? mometasone-formoterol 200-5 mcg/actuation HFAA INHALE 2 PUFFS TWO (2) TIMES A DAY. 39 g 6   ??? mometasone-formoterol 200-5 mcg/actuation HFAA INHALE 2 PUFFS TWO (2) TIMES A DAY. 39 g 3   ??? ranitidine (ZANTAC) 150 MG tablet Take 150 mg by mouth Two (2) times a day. ??? risperiDONE (RISPERDAL) 2 MG tablet Take 2 mg by mouth nightly.     ??? sertraline (ZOLOFT) 50 MG tablet Take 100 mg by mouth daily.   1   ??? simvastatin (ZOCOR) 20 MG tablet Take 40 mg by mouth nightly.      ??? syringe with needle 1 mL 25 gauge x 5/8 Syrg Use as directed to inject methotrexate 12 each PRN     No current facility-administered medications for this visit.         Changes to medications: Joseph Sandoval reports no changes at this time.    Allergies   Allergen Reactions   ??? Lisinopril Swelling     swollen lips       Changes to allergies: No    SPECIALTY MEDICATION ADHERENCE     Humira 40mg /0.45ml: 7 days of medicine on hand   methotrexate 25mg /ml: 7 days of medicine on hand     Medication Adherence    Patient reported X missed doses in the last month: 0  Specialty Medication: Humira 40mg /0.65ml  Patient is on additional specialty medications: Yes  Additional Specialty Medications: Methotrexate 25mg /ml  Patient Reported Additional Medication X Missed Doses in the Last Month: 0  Adherence tools used: calendar          Specialty medication(s) dose(s) confirmed: Regimen is correct and unchanged.     Are there any concerns with adherence? No    Adherence counseling provided? Not needed    CLINICAL MANAGEMENT AND INTERVENTION      Clinical Benefit Assessment:    Do you feel the medicine is effective or helping your condition? Yes    Clinical Benefit counseling provided? Not needed    Adverse Effects Assessment:    Are you experiencing any side effects? No    Are you experiencing difficulty administering your medicine? No    Quality of Life Assessment:    How many days over the past month did your rheumatoid arhtritis keep you from your normal activities? For example, brushing your teeth or getting up in the morning. Patient declined to answer    Have you discussed this with your provider? Not needed    Therapy Appropriateness:    Is therapy appropriate? Yes, therapy is appropriate and should be continued DISEASE/MEDICATION-SPECIFIC INFORMATION      For patients on injectable medications: Patient currently has 1 Humira doses left and next injection is scheduled for 03/07/2019. Patient currently has 2 methotrexate doses left and next injection is scheduled for 03/04/2019.    PATIENT SPECIFIC NEEDS     ? Does the patient have any physical, cognitive, or cultural barriers? No    ? Is the patient high risk? No     ? Does the patient require a Care Management Plan? No     ? Does the patient require physician intervention or other additional services (i.e. nutrition, smoking cessation, social work)? No      SHIPPING     Specialty Medication(s) to be Shipped:   Inflammatory Disorders: Humira 40mg /0.41ml and methotrexate 25mg /ml    Other medication(s) to be shipped: syringes, sharps kit       Changes to insurance: No    Delivery Scheduled: Yes, Expected medication delivery date: 03/10/2019.     Medication will be delivered via UPS to the confirmed prescription address in [facility].    The patient will receive a drug information handout for each medication shipped and additional FDA Medication Guides as required.  Verified that patient has previously received a Conservation officer, historic buildings.    All of the patient's questions and concerns have been addressed.    Joseph Sandoval   Joseph Sandoval

## 2019-03-08 ENCOUNTER — Telehealth: Payer: Self-pay | Admitting: Pharmacist

## 2019-03-08 NOTE — Telephone Encounter (Signed)
Patient returned call and left message that he has cut down to ~ 3 cigs per day with bupropion.   Plan to talk in 3-5 days.

## 2019-03-08 NOTE — Telephone Encounter (Signed)
Attempted phone follow-up for tobacco cessation.  Left message requesting call back to discuss progress/future plans.  Provided direct phone contact number.  I plan to call later this week if I do not hear from him.

## 2019-03-09 MED FILL — BD TUBERCULIN SYRINGE 1 ML 25 GAUGE X 5/8": 84 days supply | Qty: 12 | Fill #2

## 2019-03-09 MED FILL — METHOTREXATE SODIUM 25 MG/ML INJECTION SOLUTION: 84 days supply | Qty: 12 | Fill #3 | Status: AC

## 2019-03-09 MED FILL — HUMIRA PEN CITRATE FREE 40 MG/0.4 ML: 84 days supply | Qty: 12 | Fill #0 | Status: AC

## 2019-03-09 MED FILL — METHOTREXATE SODIUM 25 MG/ML INJECTION SOLUTION: SUBCUTANEOUS | 84 days supply | Qty: 12 | Fill #3

## 2019-03-09 MED FILL — BD TUBERCULIN SYRINGE 1 ML 25 GAUGE X 5/8": 84 days supply | Qty: 12 | Fill #2 | Status: AC

## 2019-03-09 MED FILL — EMPTY CONTAINER: 30 days supply | Qty: 1 | Fill #1 | Status: AC

## 2019-03-09 MED FILL — EMPTY CONTAINER: 30 days supply | Qty: 1 | Fill #1

## 2019-03-11 ENCOUNTER — Telehealth: Payer: Self-pay | Admitting: Pharmacist

## 2019-03-11 DIAGNOSIS — F17200 Nicotine dependence, unspecified, uncomplicated: Secondary | ICD-10-CM

## 2019-03-11 NOTE — Telephone Encounter (Signed)
Phone follow-up attempted to assess progress with tobacco cessation.   Left message requesting call back OR alternative plan that I would try again early next week.   Last contact - patient was smoking 3-4 cigs per day per his report.

## 2019-03-11 NOTE — Assessment & Plan Note (Signed)
Phone follow-up attempted to assess progress with tobacco cessation.   Left message requesting call back OR alternative plan that I would try again early next week.   Last contact - patient was smoking 3-4 cigs per day per his report.

## 2019-03-14 NOTE — Unmapped (Signed)
RHEUMATOLOGY CLINIC FOLLOW-UP NOTE      Assessment/Plan:      Seropositive nonerosive RA  Briefly: Diagnosed 2017. Previously on MTX/HCQ/adalimumab now down-titrated to MTX/adalimumab. Since last visit sx are relatively stable and unchanged. His lingering hand symptoms do not necessarily sound inflammatory in nature.   -- Labs: MTX monitoring labs previously ordered - strongly encouraged follow-up [either [facility] or through upcoming outside GI appointment]  -- Medications: Cont adalimumab weekly, MTX 25mg  subcutaneous weekly; folic acid 1mg  daily purchased OTC.   -- Imaging: hand xrays 2019 (-)erosions; C-spine xrays pending in anticipation of potential c-spine surgery -- patient notes he had xrays done at recent 11/2018 visit however I cannot see    -- In-person visit next appointment  -- Avoiding prednisone with hx AVN    Abdominal pain - 1 month history of abdominal pain, diarrhea, intermittent hematochezia. Notes he has upcoming local GI evaluation. He does not have a known hx of IBD in the setting of seronegative RA.   -- Patient to request records be sent to [facility] rheumatology; anticipate malignancy evaluation/colonoscopy/biopsies    High risk medication use  -- Labs: as above  -- Vaccinations: UTD influenza, PNA13/PNA23    RTC in Return in about 6 months (around 09/12/2019).    This patient was seen and discussed with Dr. Scarlette Calico who agrees with the plan outlined above.    I spent 20 minutes on the phone visit with the patient on the date of service. I spent an additional 5 minutes on pre- and post-visit activities.     The patient was not located and I was located within 250 yards of a hospital based location during the phone visit. The patient was physically located in [place] or a state in which I am permitted to provide care. The patient and/or parent/guardian understood that s/he may incur co-pays and cost sharing, and agreed to the telemedicine visit. The visit was reasonable and appropriate under the circumstances given the patient's presentation at the time.    The patient and/or parent/guardian has been advised of the potential risks and limitations of this mode of treatment (including, but not limited to, the absence of in-person examination) and has agreed to be treated using telemedicine. The patient's/patient's family's questions regarding telemedicine have been answered.    If the visit was completed in an ambulatory setting, the patient and/or parent/guardian has also been advised to contact their provider???s office for worsening conditions, and seek emergency medical treatment and/or call 911 if the patient deems either necessary.    Subjective:   Primary Care Provider: [facility]    HPI:  Joseph Sandoval is a 49 y.o.  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 Last seen in clinic for virtual visit 08/2018 at which time he was continued on adalimumab weekly & MTX 25mg  subcutaneous weekly. He had a rash on his arm that was followed by outside provider & resolved without recurrence.    -- Today he is feeling pretty good.   -- He notes that if he uses either of his bilateral 3rd digits [using a screw driving or tools, for example] he will experience pain for many days in these joints afterwards. He notes bilateral digit swelling in between knuckles after use of his hands. No clear pain but sensation of swelling. He has a history of CTS and will experience perhaps a sensation  of paresthesia intermittently in his UE digits.   -- When he is not working and using his hands he does not have joint pain in his digits or wrists. He continues to experience bilateral knee pain with use. His hips are not bothering him very much.    Further ROS as below.    Disease History:  -- 2017 diagnosed with seropositive RA when he presented with bilateral small joint inflammatory arthritis. Labs notable for initially +RF with follow-up testing negative, +CCP 18. Hand imaging without erosions. Bilateral hip imaging with osteonecrosis for which he underwent R hip arthroplasty in 2017. Initiated MTX.  -- 2018 concern for uncontrolled sx prompting addition of HCQ. Subsequently escalated to adalimumab.  Underwent L carpal tunnel release.  -- 2019 increased adalimumab to qweekly. Discontinued HCQ given no benefit. Continued MTX. 2019 concern for poor disease control although he had missed several doses of IS in the setting of infections [appendicitis, diverticulitis]. Hx C5-C7 ACDF.  -- 2020 concern for screw loosing/broken, pseudoarthrosis C6-C7 & myelopathy symptoms. Evaluated by Ortho with potential indication for surgery.     Labs:  -- 2016-2017: RF 18 then normalized; CCP 18    Imaging:  -- 2020: CT C-spine: Prior C5-C7 ACDF with fracture of the left C7 screw with minimal backing out of the screw head. No ankylosis evident at C6-C7. Unchanged spondylosis compared to MRI from 08/22/2017.  -- 2020: Xray L hip: Mild sclerosis of the left femoral head is consistent with osteonecrosis. No evidence for subchondral fracture or articular surface collapse.  -- 2020: Xray X-spine flexion: ACDF C5-C7. Mild retraction and fracture of one of the C7 screws, unchanged from prior. Hardware is otherwise intact. Straightening of cervical lordosis without static listhesis. No abnormal motion flexion or extension to suggest dynamic instability.  -- 2019: Xray hands: No radiographic evidence of inflammatory or erosive arthropathy involving the hands.    Review of Systems   Constitutional: Negative for appetite change, chills, fever and unexpected weight change.   HENT: Positive for congestion. Negative for sore throat.    Eyes: Negative for visual disturbance.   Respiratory: Positive for shortness of breath (attributes to COPD). Negative for cough.    Cardiovascular: Negative for chest pain.   Gastrointestinal: Positive for abdominal pain (Going to see a GI physician 03/16/2019), blood in stool (Notes 1 month of BRBPR - 4 episodes in 1 day - Going to see GI provider 03/16/2019) and diarrhea.   Genitourinary: Negative for dysuria.   Musculoskeletal: Positive for arthralgias (As above).   Skin: Positive for rash (notes onset of acne after Chantix initiation; rash improved with discontuation).   Neurological: Positive for numbness (intermittent bilateral UE - notes last evaluation 2-3 years ago).     PMH, PFH, PSH as previously documented.  Medications were reviewed this visit.    Objective:   There were no vitals filed for this visit.  There is no height or weight on file to calculate BMI.    Physical Exam  -- GENERAL: no audible distress  -- PULM: no audible increased work of breathing  -- PSYCH: pleasant; appropriate; engages in conversation    Test Results  No visits with results within 4 Week(s) from this visit.   Latest known visit with results is:   Office Visit on 04/13/2018   Component Date Value   ??? AST 04/13/2018 38    ??? ALT 04/13/2018 57*   ??? BUN 04/13/2018 13    ??? Creatinine 04/13/2018 0.78    ???  EGFR CKD-EPI Non-African* 04/13/2018 >90    ??? EGFR CKD-EPI African Ame* 04/13/2018 >90    ??? WBC 04/13/2018 9.5    ??? RBC 04/13/2018 5.00    ??? HGB 04/13/2018 17.0    ??? HCT 04/13/2018 50.3    ??? MCV 04/13/2018 100.7*   ??? MCH 04/13/2018 34.0    ??? MCHC 04/13/2018 33.8    ??? RDW 04/13/2018 14.3    ??? MPV 04/13/2018 6.9*   ??? Platelet 04/13/2018 236    ??? Neutrophils % 04/13/2018 53.3    ??? Lymphocytes % 04/13/2018 33.2    ??? Monocytes % 04/13/2018 5.3    ??? Eosinophils % 04/13/2018 2.5    ??? Basophils % 04/13/2018 1.1    ??? Absolute Neutrophils 04/13/2018 5.1    ??? Absolute Lymphocytes 04/13/2018 3.2    ??? Absolute Monocytes 04/13/2018 0.5    ??? Absolute Eosinophils 04/13/2018 0.2    ??? Absolute Basophils 04/13/2018 0.1    ??? Large Unstained Cells 04/13/2018 5*   ??? Macrocytosis 04/13/2018 Moderate*

## 2019-03-15 ENCOUNTER — Encounter: Admit: 2019-03-15 | Discharge: 2019-03-16 | Payer: MEDICARE | Attending: Internal Medicine | Primary: Internal Medicine

## 2019-03-15 DIAGNOSIS — Z79899 Other long term (current) drug therapy: Principal | ICD-10-CM

## 2019-03-15 DIAGNOSIS — R109 Unspecified abdominal pain: Principal | ICD-10-CM

## 2019-03-15 DIAGNOSIS — M0579 Rheumatoid arthritis with rheumatoid factor of multiple sites without organ or systems involvement: Principal | ICD-10-CM

## 2019-03-16 ENCOUNTER — Other Ambulatory Visit: Payer: Medicaid Other

## 2019-03-16 ENCOUNTER — Ambulatory Visit (INDEPENDENT_AMBULATORY_CARE_PROVIDER_SITE_OTHER): Payer: Medicaid Other | Admitting: Gastroenterology

## 2019-03-16 ENCOUNTER — Encounter: Payer: Self-pay | Admitting: Gastroenterology

## 2019-03-16 VITALS — BP 140/80 | HR 80 | Temp 98.3°F | Ht 68.0 in | Wt 258.0 lb

## 2019-03-16 DIAGNOSIS — Z01818 Encounter for other preprocedural examination: Secondary | ICD-10-CM

## 2019-03-16 DIAGNOSIS — R1084 Generalized abdominal pain: Secondary | ICD-10-CM

## 2019-03-16 DIAGNOSIS — K921 Melena: Secondary | ICD-10-CM

## 2019-03-16 DIAGNOSIS — R197 Diarrhea, unspecified: Secondary | ICD-10-CM

## 2019-03-16 MED ORDER — SUPREP BOWEL PREP KIT 17.5-3.13-1.6 GM/177ML PO SOLN: 1.0000 | ORAL | 0 refills | Status: DC

## 2019-03-16 NOTE — Progress Notes (Signed)
HPI: This is a very pleasant 49 year old man who was referred to me by [name], DO  to evaluate change in bowels, abdominal pain, rectal bleeding.    I last saw him October 2019.  This was for an abnormal CT scan.  The CT report described a "acute inflammatory process involving a small bowel loop in the right lateral mid abdomen, compatible with acute diverticulitis.  No perforation or abscess" he had I had a nice discussion about this.  By the time of his office visit he was feeling completely back to normal.  I asked that he follow-up with me 3 months later.  I have not heard from him since.  For the past month or 2 he has had generalized abdominal pain, probably more impressive in the lower abdomen.  This is associated with bowel changes with diarrhea urgency and 3 or 4 times he has seen bright red blood per rectum as well.  No nocturnal symptoms.  The pains are crampy.  No nausea or vomiting associated.  His weight has been overall stable.  He has not been on any antibiotics recently.  No sick contacts that he is aware of.  He is immune compromised with Humira and methotrexate for rheumatoid arthritis  Old Data Reviewed: Blood work December 2020 showed normal hemoglobin, positive Hemoccult stool, c-Met and lipase were normal except for very slightly elevated ALT    Review of systems: Pertinent positive and negative review of systems were noted in the above HPI section. All other review negative.   Past Medical History:  Diagnosis Date  . A-fib ([place])   . Acid reflux   . Alcohol abuse   . Anxiety   . Asthma   . AVN (avascular necrosis of bone) ([place])   . Chronic back pain   . Depression   . Hepatitis C    history of  . History of urinary frequency   . Hypertension   . Peripheral neuropathy    hands and feet  . Polysubstance abuse ([place]) 01/25/2011  . 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[place])   . 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[place])   . Seizures ([place])     Past Surgical History:  Procedure  Laterality Date  . APPENDECTOMY    . CARDIOVERSION  03/07/2006  . CARPAL TUNNEL RELEASE    . CERVICAL FUSION    . LAPAROSCOPIC APPENDECTOMY N/A 07/23/2017   Procedure: APPENDECTOMY LAPAROSCOPIC;  Surgeon: Georganna Skeans, MD;  Location: [facility];  Service: General;  Laterality: N/A;    Current Outpatient Medications  Medication Sig Dispense Refill  . acetaminophen (TYLENOL) 325 MG tablet Take 650 mg by mouth 3 (three) times daily.    . Adalimumab (HUMIRA) 40 MG/0.8ML PSKT Inject 0.8 mLs (40 mg total) into the skin once a week. tuesdays (Patient taking differently: Inject 0.8 mLs into the skin once a week. Sunday) 3.2 mL 11  . Albuterol Sulfate (PROAIR RESPICLICK) 263 (90 Base) MCG/ACT AEPB Inhale 2 puffs into the lungs every 4 (four) hours as needed (wheezing; cough). 1 each 0  . Albuterol Sulfate (PROAIR RESPICLICK) 785 (90 Base) MCG/ACT AEPB Inhale 2 puffs into the lungs every 4 (four) hours as needed (wheezing; cough). 1 each 0  . amLODipine (NORVASC) 5 MG tablet Take 5 mg by mouth at bedtime.  4  . baclofen (LIORESAL) 10 MG tablet Take 10 mg by mouth 3 (three) times daily.    . benztropine (COGENTIN) 0.5 MG tablet Take 0.5 mg by mouth 2 (two) times daily.    [name]  buPROPion (WELLBUTRIN XL) 150 MG 24 hr tablet Take 1 tablet (150 mg total) by mouth daily. Take 1 tablet QAM for 2 weeks then increase to 2 tablets QAM. 30 tablet 1  . diclofenac Sodium (VOLTAREN) 1 % GEL Apply 1 application topically 3 (three) times daily.    [name] dicyclomine (BENTYL) 20 MG tablet Take 1 tablet (20 mg total) by mouth 4 (four) times daily -  before meals and at bedtime. 20 tablet 0  . FOLIC ACID PO Take by mouth.    . gabapentin (NEURONTIN) 400 MG capsule Take 1 capsule (400 mg total) by mouth 3 (three) times daily. 60 capsule 2  . hydrochlorothiazide (HYDRODIURIL) 25 MG tablet Take 25 mg by mouth daily.    . hydrocortisone (ANUSOL-HC) 25 MG suppository Place 1 suppository (25 mg total) rectally 2 (two) times daily. 16  suppository 0  . ibuprofen (ADVIL) 200 MG tablet Take 200 mg by mouth every 6 (six) hours as needed.    . meloxicam (MOBIC) 15 MG tablet Take 1 tablet (15 mg total) by mouth daily. 30 tablet 1  . Methotrexate, Anti-Rheumatic, (METHOTREXATE, PF, [place]) Inject 1 mL into the skin once a week.     . metoprolol tartrate (LOPRESSOR) 50 MG tablet Take 50 mg by mouth 2 (two) times daily.  4  . nicotine (NICOTINE STEP 1) 21 mg/24hr patch Place 1 patch (21 mg total) onto the skin daily. 28 patch 1  . pantoprazole (PROTONIX) 40 MG tablet Take 1 tablet (40 mg total) by mouth daily. 30 tablet 3  . ranitidine (ZANTAC) 150 MG tablet Take 150 mg by mouth 2 (two) times daily.    . simvastatin (ZOCOR) 40 MG tablet Take 40 mg by mouth daily.    . Turmeric 500 MG CAPS Take 1 capsule by mouth daily.    [name] venlafaxine (EFFEXOR) 25 MG tablet Take 25 mg by mouth 2 (two) times daily.     No current facility-administered medications for this visit.    Allergies as of 03/16/2019 - Review Complete 02/25/2019  Allergen Reaction Noted  . Lisinopril Swelling 09/13/2015  . Varenicline Dermatitis, Rash, and Other (See Comments) 02/22/2019    Family History  Problem Relation Age of Onset  . Heart attack Mother   . Atrial fibrillation Mother   . Skin cancer Father     Social History   Socioeconomic History  . Marital status: Single    Spouse name: Not on file  . Number of children: Not on file  . Years of education: Not on file  . Highest education level: Not on file  Occupational History  . Not on file  Tobacco Use  . Smoking status: Current Every Day Smoker    Packs/day: 2.00    Years: 34.00    Pack years: 68.00    Types: Cigarettes    Start date: 02/12/1984  . Smokeless tobacco: Former Systems developer    Quit date: 10/24/2012  . Tobacco comment: 1.5-2ppd  Quit attempt 12/24/2018  Substance and Sexual Activity  . Alcohol use: Not Currently  . Drug use: Not Currently    Types: "Crack" cocaine, Cocaine    Comment:  last use 07/26/13  . Sexual activity: Never  Other Topics Concern  . Not on file  Social History Narrative  . Not on file   Social Determinants of Health   Financial Resource Strain:   . Difficulty of Paying Living Expenses: Not on file  Food Insecurity:   . Worried About Estate manager/land agent  of Food in the Last Year: Not on file  . Ran Out of Food in the Last Year: Not on file  Transportation Needs:   . Lack of Transportation (Medical): Not on file  . Lack of Transportation (Non-Medical): Not on file  Physical Activity:   . Days of Exercise per Week: Not on file  . Minutes of Exercise per Session: Not on file  Stress:   . Feeling of Stress : Not on file  Social Connections:   . Frequency of Communication with Friends and Family: Not on file  . Frequency of Social Gatherings with Friends and Family: Not on file  . Attends Religious Services: Not on file  . Active Member of Clubs or Organizations: Not on file  . Attends Archivist Meetings: Not on file  . Marital Status: Not on file  Intimate Partner Violence:   . Fear of Current or Ex-Partner: Not on file  . Emotionally Abused: Not on file  . Physically Abused: Not on file  . Sexually Abused: Not on file     Physical Exam: There were no vitals taken for this visit. Constitutional: generally well-appearing Psychiatric: alert and oriented x3 Eyes: extraocular movements intact Mouth: oral pharynx moist, no lesions Neck: supple no lymphadenopathy Cardiovascular: heart regular rate and rhythm Lungs: clear to auscultation bilaterally Abdomen: soft, very mild tenderness right lower quadrant, nondistended, no obvious ascites, no peritoneal signs, normal bowel sounds Extremities: no lower extremity edema bilaterally Skin: no lesions on visible extremities   Assessment and plan: 49 y.o. male with generalized abdominal pain, diarrhea, intermittent rectal bleeding  Infectious etiology, perhaps new onset of inflammatory bowel  disease, neoplasm is also possibility however I think that is less likely.  I recommended we proceed with testing to try to sort this out I suggested GI pathogen panel as the first test.  He understands that if this is positive then he would possibly require antibiotics, especially since he is immune compromised.  If it is negative then he would likely require further testing with a colonoscopy.  I see no reason for any further blood tests or imaging studies other than those mentioned above.  Please see the "Patient Instructions" section for addition details about the plan.   Owens Loffler, MD [facility] 03/16/2019, 8:51 AM  Cc: [name], DO  Total time on date of encounter was 45  minutes (this included time spent preparing to see the patient reviewing records; obtaining and/or reviewing separately obtained history; performing a medically appropriate exam and/or evaluation; counseling and educating the patient and family if present; ordering medications, tests or procedures if applicable; and documenting clinical information in the health record).

## 2019-03-16 NOTE — Patient Instructions (Addendum)
If you are age 50 or older, your body mass index should be between 23-30. Your Body mass index is 39.23 kg/m. If this is out of the aforementioned range listed, please consider follow up with your Primary Care Provider.  If you are age 2 or younger, your body mass index should be between 19-25. Your Body mass index is 39.23 kg/m. If this is out of the aformentioned range listed, please consider follow up with your Primary Care Provider.   Your provider has requested that you go to the basement level for lab work before leaving today. Press "B" on the elevator. The lab is located at the first door on the left as you exit the elevator.  You have been scheduled for a colonoscopy. Please follow written instructions given to you at your visit today.  Please pick up your prep supplies at the pharmacy within the next 1-3 days. If you use inhalers (even only as needed), please bring them with you on the day of your procedure.  Thank you, Dr Ardis Hughs

## 2019-03-17 ENCOUNTER — Other Ambulatory Visit: Payer: Medicaid Other

## 2019-03-17 ENCOUNTER — Telehealth: Payer: Self-pay | Admitting: Pharmacist

## 2019-03-17 DIAGNOSIS — F17200 Nicotine dependence, unspecified, uncomplicated: Secondary | ICD-10-CM

## 2019-03-17 DIAGNOSIS — R197 Diarrhea, unspecified: Secondary | ICD-10-CM

## 2019-03-17 DIAGNOSIS — K921 Melena: Secondary | ICD-10-CM

## 2019-03-17 DIAGNOSIS — R1084 Generalized abdominal pain: Secondary | ICD-10-CM

## 2019-03-17 NOTE — Telephone Encounter (Signed)
-----   Message from Leavy Cella, [facility] sent at 03/11/2019  9:55 AM EST ----- Regarding: tobacco - on bupropion cut down to 3-4 cigs

## 2019-03-17 NOTE — Assessment & Plan Note (Signed)
Phone follow-up for tobacco cessation.  Patient reports continued smoking of 5-6 cigs per day.  He states that he recognized that he has NOT increased to 300mg  (2x150mg  XL) daily.  He plans to get a straw for his driving induced smoking.  He remains committed to smoking reduction/cessation.   He is worried that he may need to have "stomach surgery" in the future (unsure at this time).   Offered support by phone in 10-14 days.  Patient verbalized appreciation for call/support.

## 2019-03-17 NOTE — Telephone Encounter (Signed)
Phone follow-up for tobacco cessation.  Patient reports continued smoking of 5-6 cigs per day.  He states that he recognized that he has NOT increased to 300mg  (2x150mg  XL) daily.  He plans to get a straw for his driving induced smoking.  He remains committed to smoking reduction/cessation.   He is worried that he may need to have "stomach surgery" in the future (unsure at this time).   Offered support by phone in 10-14 days.  Patient verbalized appreciation for call/support.

## 2019-03-20 ENCOUNTER — Encounter (HOSPITAL_COMMUNITY): Payer: Self-pay

## 2019-03-20 ENCOUNTER — Ambulatory Visit (HOSPITAL_COMMUNITY)
Admission: EM | Admit: 2019-03-20 | Discharge: 2019-03-20 | Disposition: A | Discharge status: Home / Self Care | Payer: Medicaid Other | Attending: Emergency Medicine | Admitting: Emergency Medicine

## 2019-03-20 ENCOUNTER — Other Ambulatory Visit: Payer: Self-pay

## 2019-03-20 DIAGNOSIS — K0889 Other specified disorders of teeth and supporting structures: Secondary | ICD-10-CM

## 2019-03-20 DIAGNOSIS — H9201 Otalgia, right ear: Secondary | ICD-10-CM

## 2019-03-20 MED ORDER — CEFDINIR 300 MG PO CAPS: 300.0000 mg | ORAL_CAPSULE | Freq: Two times a day (BID) | ORAL | 0 refills | Status: AC

## 2019-03-20 NOTE — Discharge Instructions (Signed)
Complete course of antibiotics.  Tylenol as needed for pain.  Please follow up with your dentist for definitive treatment.  Follow up with your pcp if your ear pain persists or worsens.

## 2019-03-20 NOTE — ED Triage Notes (Signed)
Pt c/o tooth pain to lower right with right ear discomfort, onset this a.m. States dentist is delaying tooth extraction until cleared by pt's orthopedist/spinal Psychologist, sport and exercise. Denies fever, chills.

## 2019-03-20 NOTE — ED Provider Notes (Signed)
[facility]    CSN: [id number] Arrival date & time: 03/20/19  1311      History   Chief Complaint Chief Complaint  Patient presents with  . Dental Pain  . Otalgia    HPI Blake Arnold is a 49 y.o. male.   Blake Arnold presents with complaints of right dental pain and right ear pain, he woke with this this morning. No difficulty opening his mouth/ jaw and has been able to eat well. Known dental caries to tooth in the area, and is working with his dentist to schedule getting it pulled. Has had similar in the past with other teeth which have become infected. No fevers. No facial swelling. Took ibuprofen 3 hours ago which didn't help. He has been getting worked up with GI for diarrhea and blood in stool. History of RA and is on methotrexate for this.    ROS per HPI, negative if not otherwise mentioned.      Past Medical History:  Diagnosis Date  . A-fib ([place])   . Acid reflux   . Alcohol abuse   . Anxiety   . Asthma   . AVN (avascular necrosis of bone) ([place])   . Chronic back pain   . Depression   . Hepatitis C    history of  . History of urinary frequency   . Hypertension   . Peripheral neuropathy    hands and feet  . Polysubstance abuse ([place]) 01/25/2011  . 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[place])   . 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[place])   . Seizures [facility])     Patient Active Problem List   Diagnosis Date Noted  . Abdominal pain 02/28/2019  . Shoulder pain 12/17/2018  . Hyperlipidemia 09/01/2018  . HTN (hypertension) 07/24/2017  . Seizure ([place]) 07/24/2017  . RA (rheumatoid arthritis) ([place]) 07/24/2017  . Appendicitis 07/23/2017  . Alcohol abuse with alcohol-induced mood disorder ([place]) 07/26/2013  . Bipolar disorder, unspecified ([place]) 10/28/2012  . Alcohol dependence ([place]) 04/02/2012  . Alcohol withdrawal ([place]) 04/02/2012  . Major depression 04/02/2012  . Legal problem 04/22/2011  . Polysubstance abuse ([place]) 01/25/2011  . Bronchiolitis 01/25/2011  . Alcohol abuse  01/22/2011  . Chest pain 01/22/2011  . TOBACCO ABUSE 02/09/2010  . SUBSTANCE ABUSE 02/09/2010  . FIBRILLATION, ATRIAL 06/08/2009    Past Surgical History:  Procedure Laterality Date  . APPENDECTOMY    . CARDIOVERSION  03/07/2006  . CARPAL TUNNEL RELEASE    . CERVICAL FUSION    . LAPAROSCOPIC APPENDECTOMY N/A 07/23/2017   Procedure: APPENDECTOMY LAPAROSCOPIC;  Surgeon: Georganna Skeans, MD;  Location: [facility];  Service: General;  Laterality: N/A;       Home Medications    Prior to Admission medications   Medication Sig Start Date End Date Taking? Authorizing Provider  Albuterol Sulfate (PROAIR RESPICLICK) 188 (90 Base) MCG/ACT AEPB Inhale 2 puffs into the lungs every 4 (four) hours as needed (wheezing; cough). 12/24/18  Yes Hensel, Jamal Collin, MD  amLODipine (NORVASC) 5 MG tablet Take 5 mg by mouth at bedtime. 07/14/17  Yes [provider]  baclofen (LIORESAL) 10 MG tablet Take 10 mg by mouth 3 (three) times daily.   Yes [provider]  benztropine (COGENTIN) 0.5 MG tablet Take 0.5 mg by mouth 2 (two) times daily.   Yes [provider]  buPROPion (WELLBUTRIN XL) 150 MG 24 hr tablet Take 1 tablet (150 mg total) by mouth daily. Take 1 tablet QAM for 2 weeks then increase to 2 tablets QAM.  02/25/19  Yes Hensel, Jamal Collin, MD  diclofenac Sodium (VOLTAREN) 1 % GEL Apply 1 application topically 3 (three) times daily.   Yes [provider]  hydrochlorothiazide (HYDRODIURIL) 25 MG tablet Take 25 mg by mouth daily.   Yes [provider]  Methotrexate, Anti-Rheumatic, (METHOTREXATE, PF, [place]) Inject 1 mL into the skin once a week.    Yes [provider]  metoprolol tartrate (LOPRESSOR) 50 MG tablet Take 50 mg by mouth 2 (two) times daily. 07/14/17  Yes [provider]  nicotine (NICOTINE STEP 1) 21 mg/24hr patch Place 1 patch (21 mg total) onto the skin daily. 01/21/19  Yes Hensel, Jamal Collin, MD  pantoprazole (PROTONIX) 40 MG tablet Take 1  tablet (40 mg total) by mouth daily. 02/25/19  Yes Carollee Leitz, MD  simvastatin (ZOCOR) 40 MG tablet Take 40 mg by mouth daily. 09/25/18  Yes [provider]  Adalimumab (HUMIRA) 40 MG/0.8ML PSKT Inject 0.8 mLs (40 mg total) into the skin once a week. tuesdays Patient taking differently: Inject 0.8 mLs into the skin once a week. Sunday 08/05/17 08/05/18  Saverio Danker, PA-C  cefdinir (OMNICEF) 300 MG capsule Take 1 capsule (300 mg total) by mouth 2 (two) times daily for 7 days. 03/20/19 03/27/19  Augusto Gamble B, NP  FOLIC ACID PO Take by mouth.    [provider]  Na Sulfate-K Sulfate-Mg Sulf (SUPREP BOWEL PREP KIT) 17.5-3.13-1.6 GM/177ML SOLN Take 1 kit by mouth as directed. 03/16/19   Milus Banister, MD    Family History Family History  Problem Relation Age of Onset  . Heart attack Mother   . Atrial fibrillation Mother   . Skin cancer Father     Social History Social History   Tobacco Use  . Smoking status: Current Every Day Smoker    Packs/day: 2.00    Years: 34.00    Pack years: 68.00    Types: Cigarettes    Start date: 02/12/1984  . Smokeless tobacco: Former Systems developer    Quit date: 10/24/2012  . Tobacco comment: 1.5-2ppd  Quit attempt 12/24/2018  Substance Use Topics  . Alcohol use: Yes    Comment: socially  . Drug use: Not Currently    Types: "Crack" cocaine, Cocaine    Comment: last use 07/26/13     Allergies   Lisinopril and Varenicline   Review of Systems Review of Systems   Physical Exam Triage Vital Signs ED Triage Vitals  Enc Vitals Group     BP 03/20/19 1326 (!) 151/98     Pulse Rate 03/20/19 1326 83     Resp 03/20/19 1326 18     Temp 03/20/19 1326 97.6 F (36.4 C)     Temp Source 03/20/19 1326 Oral     SpO2 03/20/19 1326 99 %     Weight --      Height --      Head Circumference --      Peak Flow --      Pain Score 03/20/19 1323 5     Pain Loc --      Pain Edu? --      Excl. in [place]? --    No data found.  Updated Vital Signs BP  (!) 151/98 (BP Location: Right Arm)   Pulse 83   Temp 97.6 F (36.4 C) (Oral)   Resp 18   SpO2 99%   Visual Acuity Right Eye Distance:   Left Eye Distance:   Bilateral Distance:    Right  Eye Near:   Left Eye Near:    Bilateral Near:     Physical Exam Constitutional:      Appearance: He is well-developed.  HENT:     Right Ear: A middle ear effusion is present. Tympanic membrane is not bulging.     Ears:     Comments: Right TM is opaque and dull in appearance     Mouth/Throat:     Dentition: Abnormal dentition. Dental caries present. No dental abscesses.     Comments: Tooth #31 with mild underlying tenderness and caries visualized, surrounding teeth previously pulled; no swelling noted  Cardiovascular:     Rate and Rhythm: Normal rate.  Pulmonary:     Effort: Pulmonary effort is normal.  Skin:    General: Skin is warm and dry.  Neurological:     Mental Status: He is alert and oriented to person, place, and time.      UC Treatments / Results  Labs (all labs ordered are listed, but only abnormal results are displayed) Labs Reviewed - No data to display  EKG   Radiology No results found.  Procedures Procedures (including critical care time)  Medications Ordered in UC Medications - No data to display  Initial Impression / Assessment and Plan / UC Course  I have reviewed the triage vital signs and the nursing notes.  Pertinent labs & imaging results that were available during my care of the patient were reviewed by me and considered in my medical decision making (see chart for details).     Methotrexate and has been seeing gi related to symptoms, limiting typical dental antibiotics. Coverage provided today with cefdinir for right ear and for right lower molar. Encouraged follow up with dentist for definitive treatment. Patient verbalized understanding and agreeable to plan.   Final Clinical Impressions(s) / UC Diagnoses   Final diagnoses:  Pain, dental    Right ear pain     Discharge Instructions     Complete course of antibiotics.  Tylenol as needed for pain.  Please follow up with your dentist for definitive treatment.  Follow up with your pcp if your ear pain persists or worsens.    ED Prescriptions    Medication Sig Dispense Auth. Provider   cefdinir (OMNICEF) 300 MG capsule Take 1 capsule (300 mg total) by mouth 2 (two) times daily for 7 days. 14 capsule Zigmund Gottron, NP     PDMP not reviewed this encounter.   Zigmund Gottron, NP 03/20/19 1505

## 2019-03-22 ENCOUNTER — Ambulatory Visit (HOSPITAL_COMMUNITY)
Admission: EM | Admit: 2019-03-22 | Discharge: 2019-03-22 | Disposition: A | Discharge status: Home / Self Care | Payer: Medicaid Other | Attending: Internal Medicine | Admitting: Internal Medicine

## 2019-03-22 ENCOUNTER — Other Ambulatory Visit: Payer: Self-pay

## 2019-03-22 ENCOUNTER — Encounter (HOSPITAL_COMMUNITY): Payer: Self-pay

## 2019-03-22 DIAGNOSIS — Z79899 Other long term (current) drug therapy: Secondary | ICD-10-CM | POA: Diagnosis not present

## 2019-03-22 DIAGNOSIS — R569 Unspecified convulsions: Secondary | ICD-10-CM | POA: Insufficient documentation

## 2019-03-22 DIAGNOSIS — R071 Chest pain on breathing: Secondary | ICD-10-CM | POA: Insufficient documentation

## 2019-03-22 DIAGNOSIS — Z7901 Long term (current) use of anticoagulants: Secondary | ICD-10-CM | POA: Diagnosis not present

## 2019-03-22 DIAGNOSIS — E785 Hyperlipidemia, unspecified: Secondary | ICD-10-CM | POA: Diagnosis not present

## 2019-03-22 DIAGNOSIS — R0789 Other chest pain: Secondary | ICD-10-CM | POA: Diagnosis not present

## 2019-03-22 DIAGNOSIS — F1010 Alcohol abuse, uncomplicated: Secondary | ICD-10-CM | POA: Diagnosis not present

## 2019-03-22 DIAGNOSIS — F319 Bipolar disorder, unspecified: Secondary | ICD-10-CM | POA: Diagnosis not present

## 2019-03-22 DIAGNOSIS — F17210 Nicotine dependence, cigarettes, uncomplicated: Secondary | ICD-10-CM | POA: Insufficient documentation

## 2019-03-22 DIAGNOSIS — K219 Gastro-esophageal reflux disease without esophagitis: Secondary | ICD-10-CM | POA: Diagnosis not present

## 2019-03-22 DIAGNOSIS — I10 Essential (primary) hypertension: Secondary | ICD-10-CM | POA: Insufficient documentation

## 2019-03-22 DIAGNOSIS — J45909 Unspecified asthma, uncomplicated: Secondary | ICD-10-CM | POA: Diagnosis not present

## 2019-03-22 DIAGNOSIS — M069 Rheumatoid arthritis, unspecified: Secondary | ICD-10-CM | POA: Insufficient documentation

## 2019-03-22 DIAGNOSIS — F419 Anxiety disorder, unspecified: Secondary | ICD-10-CM | POA: Insufficient documentation

## 2019-03-22 DIAGNOSIS — R0982 Postnasal drip: Secondary | ICD-10-CM | POA: Insufficient documentation

## 2019-03-22 DIAGNOSIS — Z20822 Contact with and (suspected) exposure to covid-19: Secondary | ICD-10-CM | POA: Insufficient documentation

## 2019-03-22 NOTE — ED Provider Notes (Signed)
[place]    CSN: [id number] Arrival date & time: 03/22/19  8676      History   Chief Complaint Chief Complaint  Patient presents with  . Appointment    10.00  . Chest Pain    HPI Blake Arnold is a 49 y.o. male who is currently being treated for ear infection comes to urgent care with complaints of central chest pain.  Patient says that the chest pain is worse with taking a deep breath.  He has a cough which is worse in the morning.  He also admits to having postnasal drip.  No shortness of breath, wheezing, relieving factors or numbness/tingling.  No fever or chills.  Is requesting COVID-19 test.   HPI  Past Medical History:  Diagnosis Date  . A-fib ([place])   . Acid reflux   . Alcohol abuse   . Anxiety   . Asthma   . AVN (avascular necrosis of bone) ([place])   . Chronic back pain   . Depression   . Hepatitis C    history of  . History of urinary frequency   . Hypertension   . Peripheral neuropathy    hands and feet  . Polysubstance abuse ([place]) 01/25/2011  . 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[place])   . 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[place])   . Seizures [facility])     Patient Active Problem List   Diagnosis Date Noted  . Abdominal pain 02/28/2019  . Shoulder pain 12/17/2018  . Hyperlipidemia 09/01/2018  . HTN (hypertension) 07/24/2017  . Seizure ([place]) 07/24/2017  . RA (rheumatoid arthritis) ([place]) 07/24/2017  . Appendicitis 07/23/2017  . Alcohol abuse with alcohol-induced mood disorder ([place]) 07/26/2013  . Bipolar disorder, unspecified ([place]) 10/28/2012  . Alcohol dependence ([place]) 04/02/2012  . Alcohol withdrawal ([place]) 04/02/2012  . Major depression 04/02/2012  . Legal problem 04/22/2011  . Polysubstance abuse ([place]) 01/25/2011  . Bronchiolitis 01/25/2011  . Alcohol abuse 01/22/2011  . Chest pain 01/22/2011  . TOBACCO ABUSE 02/09/2010  . SUBSTANCE ABUSE 02/09/2010  . FIBRILLATION, ATRIAL 06/08/2009    Past Surgical History:  Procedure Laterality Date  . APPENDECTOMY     . CARDIOVERSION  03/07/2006  . CARPAL TUNNEL RELEASE    . CERVICAL FUSION    . LAPAROSCOPIC APPENDECTOMY N/A 07/23/2017   Procedure: APPENDECTOMY LAPAROSCOPIC;  Surgeon: Georganna Skeans, MD;  Location: [place];  Service: General;  Laterality: N/A;       Home Medications    Prior to Admission medications   Medication Sig Start Date End Date Taking? Authorizing Provider  Adalimumab (HUMIRA) 40 MG/0.8ML PSKT Inject 0.8 mLs (40 mg total) into the skin once a week. tuesdays Patient taking differently: Inject 0.8 mLs into the skin once a week. Sunday 08/05/17 08/05/18  Saverio Danker, PA-C  Albuterol Sulfate (PROAIR RESPICLICK) 195 (90 Base) MCG/ACT AEPB Inhale 2 puffs into the lungs every 4 (four) hours as needed (wheezing; cough). 12/24/18   Zenia Resides, MD  amLODipine (NORVASC) 5 MG tablet Take 5 mg by mouth at bedtime. 07/14/17   [provider]  baclofen (LIORESAL) 10 MG tablet Take 10 mg by mouth 3 (three) times daily.    [provider]  benztropine (COGENTIN) 0.5 MG tablet Take 0.5 mg by mouth 2 (two) times daily.    [provider]  buPROPion (WELLBUTRIN XL) 150 MG 24 hr tablet Take 1 tablet (150 mg total) by mouth daily. Take 1 tablet QAM for 2 weeks then increase to 2 tablets QAM. 02/25/19  Zenia Resides, MD  cefdinir (OMNICEF) 300 MG capsule Take 1 capsule (300 mg total) by mouth 2 (two) times daily for 7 days. 03/20/19 03/27/19  Augusto Gamble B, NP  diclofenac Sodium (VOLTAREN) 1 % GEL Apply 1 application topically 3 (three) times daily.    [provider]  FOLIC ACID PO Take by mouth.    [provider]  hydrochlorothiazide (HYDRODIURIL) 25 MG tablet Take 25 mg by mouth daily.    [provider]  Methotrexate, Anti-Rheumatic, (METHOTREXATE, PF, [place]) Inject 1 mL into the skin once a week.     [provider]  metoprolol tartrate (LOPRESSOR) 50 MG tablet Take 50 mg by mouth 2 (two) times daily. 07/14/17   [provider]  Na Sulfate-K Sulfate-Mg Sulf (SUPREP BOWEL PREP KIT) 17.5-3.13-1.6 GM/177ML SOLN Take 1 kit by mouth as directed. 03/16/19   Milus Banister, MD  nicotine (NICOTINE STEP 1) 21 mg/24hr patch Place 1 patch (21 mg total) onto the skin daily. 01/21/19   Zenia Resides, MD  pantoprazole (PROTONIX) 40 MG tablet Take 1 tablet (40 mg total) by mouth daily. 02/25/19   Carollee Leitz, MD  simvastatin (ZOCOR) 40 MG tablet Take 40 mg by mouth daily. 09/25/18   [provider]    Family History Family History  Problem Relation Age of Onset  . Heart attack Mother   . Atrial fibrillation Mother   . Skin cancer Father     Social History Social History   Tobacco Use  . Smoking status: Current Every Day Smoker    Packs/day: 2.00    Years: 34.00    Pack years: 68.00    Types: Cigarettes    Start date: 02/12/1984  . Smokeless tobacco: Former Systems developer    Quit date: 10/24/2012  . Tobacco comment: 1.5-2ppd  Quit attempt 12/24/2018  Substance Use Topics  . Alcohol use: Yes    Comment: socially  . Drug use: Not Currently    Types: "Crack" cocaine, Cocaine    Comment: last use 07/26/13     Allergies   Lisinopril and Varenicline   Review of Systems Review of Systems  Constitutional: Negative for activity change, chills, fatigue and fever.  HENT: Positive for congestion, ear pain and postnasal drip. Negative for dental problem, ear discharge, sinus pressure, sinus pain and sore throat.   Respiratory: Negative.   Cardiovascular: Negative for chest pain and palpitations.  Gastrointestinal: Negative for abdominal pain, nausea and vomiting.  Musculoskeletal: Negative for arthralgias and myalgias.  Skin: Negative for rash and wound.  Neurological: Negative for dizziness, numbness and headaches.     Physical Exam Triage Vital Signs ED Triage Vitals  Enc Vitals Group     BP 03/22/19 1011 (S) (!) 156/109     Pulse Rate 03/22/19 1011 71     Resp 03/22/19 1011 16     Temp  03/22/19 1011 97.9 F (36.6 C)     Temp Source 03/22/19 1011 Oral     SpO2 03/22/19 1011 98 %     Weight --      Height --      Head Circumference --      Peak Flow --      Pain Score 03/22/19 1009 4     Pain Loc --      Pain Edu? --      Excl. in [place]? --    No data found.  Updated Vital Signs BP (S) (!) 156/109 (BP Location: Right Arm)  Comment: pt states he has been eating a lot of ham; did take BP medication today  Pulse 71   Temp 97.9 F (36.6 C) (Oral)   Resp 16   SpO2 98%   Visual Acuity Right Eye Distance:   Left Eye Distance:   Bilateral Distance:    Right Eye Near:   Left Eye Near:    Bilateral Near:     Physical Exam Vitals and nursing note reviewed.  Constitutional:      Appearance: He is well-developed. He is not ill-appearing or diaphoretic.  Cardiovascular:     Rate and Rhythm: Normal rate and regular rhythm.     Heart sounds: Normal heart sounds.  Pulmonary:     Effort: Pulmonary effort is normal. No tachypnea or accessory muscle usage.     Breath sounds: No decreased breath sounds, wheezing or rhonchi.  Chest:     Chest wall: No mass or crepitus.  Abdominal:     Palpations: Abdomen is soft. There is no hepatomegaly or splenomegaly.  Skin:    Capillary Refill: Capillary refill takes less than 2 seconds.  Neurological:     General: No focal deficit present.     Mental Status: He is alert.      UC Treatments / Results  Labs (all labs ordered are listed, but only abnormal results are displayed) Labs Reviewed  NOVEL CORONAVIRUS, NAA (HOSP ORDER, SEND-OUT TO REF LAB; TAT 18-24 HRS)    EKG   Radiology No results found.  Procedures Procedures (including critical care time)  Medications Ordered in UC Medications - No data to display  Initial Impression / Assessment and Plan / UC Course  I have reviewed the triage vital signs and the nursing notes.  Pertinent labs & imaging results that were available during my care of the patient were  reviewed by me and considered in my medical decision making (see chart for details).     1.  Atypical chest pain likely chest wall pain: Patient is encouraged to start using his Flonase to reduce coughing especially in the mornings. COVID-19 PCR test has been sent. Patient is advised to self isolate until COVID-19 test result is available Patient encouraged to continue taking antibiotics for the ear infection. Final Clinical Impressions(s) / UC Diagnoses   Final diagnoses:  Atypical chest pain   Discharge Instructions   None    ED Prescriptions    None     PDMP not reviewed this encounter.   Chase Picket, MD 03/22/19 1246

## 2019-03-22 NOTE — ED Triage Notes (Signed)
Patient presents to Urgent Care with complaints of centralized chest pain since two nights ago, worse when he breathes in. Patient reports he has afib, takes his medication regularly.

## 2019-03-24 LAB — NOVEL CORONAVIRUS, NAA (HOSP ORDER, SEND-OUT TO REF LAB; TAT 18-24 HRS): SARS-CoV-2, NAA: NOT DETECTED

## 2019-03-25 LAB — GASTROINTESTINAL PATHOGEN PANEL PCR
C. difficile Tox A/B, PCR: NOT DETECTED
Campylobacter, PCR: NOT DETECTED
Cryptosporidium, PCR: NOT DETECTED
E coli (ETEC) LT/ST PCR: NOT DETECTED
E coli (STEC) stx1/stx2, PCR: NOT DETECTED
E coli 0157, PCR: NOT DETECTED
Giardia lamblia, PCR: NOT DETECTED
Norovirus, PCR: NOT DETECTED
Rotavirus A, PCR: NOT DETECTED
Salmonella, PCR: NOT DETECTED
Shigella, PCR: NOT DETECTED

## 2019-03-29 ENCOUNTER — Ambulatory Visit (HOSPITAL_COMMUNITY)
Admission: EM | Admit: 2019-03-29 | Discharge: 2019-03-29 | Disposition: A | Discharge status: Home / Self Care | Payer: Medicaid Other | Attending: Family Medicine | Admitting: Family Medicine

## 2019-03-29 ENCOUNTER — Encounter (HOSPITAL_COMMUNITY): Payer: Self-pay | Admitting: Emergency Medicine

## 2019-03-29 ENCOUNTER — Other Ambulatory Visit: Payer: Self-pay

## 2019-03-29 ENCOUNTER — Ambulatory Visit (INDEPENDENT_AMBULATORY_CARE_PROVIDER_SITE_OTHER): Payer: Medicaid Other

## 2019-03-29 DIAGNOSIS — M25552 Pain in left hip: Secondary | ICD-10-CM | POA: Diagnosis not present

## 2019-03-29 DIAGNOSIS — R52 Pain, unspecified: Secondary | ICD-10-CM

## 2019-03-29 DIAGNOSIS — M87052 Idiopathic aseptic necrosis of left femur: Secondary | ICD-10-CM | POA: Diagnosis not present

## 2019-03-29 MED ORDER — KETOROLAC TROMETHAMINE 60 MG/2ML IM SOLN
INTRAMUSCULAR | Status: AC
  Filled 2019-03-29: qty 2

## 2019-03-29 MED ORDER — KETOROLAC TROMETHAMINE 60 MG/2ML IM SOLN
60.0000 mg | Freq: Once | INTRAMUSCULAR | Status: AC
  Administered 2019-03-29: 11:00:00 60 mg via INTRAMUSCULAR

## 2019-03-29 MED ORDER — CYCLOBENZAPRINE HCL 10 MG PO TABS: 10.0000 mg | ORAL_TABLET | Freq: Two times a day (BID) | ORAL | 0 refills | Status: DC | PRN

## 2019-03-29 MED ORDER — TRAMADOL HCL 50 MG PO TABS: 50.0000 mg | ORAL_TABLET | Freq: Four times a day (QID) | ORAL | 0 refills | Status: DC | PRN

## 2019-03-29 NOTE — ED Provider Notes (Signed)
[place]   [id number] 03/29/19 Arrival Time: 1016  GX:[id number] PAIN  SUBJECTIVE: History from: patient. Blake Arnold is a 49 y.o. male complains of left hip pain that began 4 days ago.  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 Reports that it has been over 2 years since he has had an xray. Describes the pain as constant and achy in character.  Has tried OTC medications without relief.  Symptoms are made worse with activity.  Reports similar symptoms in the past, but states that he has had this issue before with his right hip.  Denies fever, chills, erythema, ecchymosis, effusion, weakness, numbness and tingling, saddle paresthesias, loss of bowel or bladder function.      ROS: As per HPI.  All other pertinent ROS negative.     Past Medical History:  Diagnosis Date  . A-fib ([place])   . Acid reflux   . Alcohol abuse   . Anxiety   . Asthma   . AVN (avascular necrosis of bone) ([place])   . Chronic back pain   . Depression   . Hepatitis C    history of  . History of urinary frequency   . Hypertension   . Peripheral neuropathy    hands and feet  . Polysubstance abuse ([place]) 01/25/2011  . 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[place])   . 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[place])   . Seizures ([place])    Past Surgical History:  Procedure Laterality Date  . APPENDECTOMY    . CARDIOVERSION  03/07/2006  . CARPAL TUNNEL RELEASE    . CERVICAL FUSION    . LAPAROSCOPIC APPENDECTOMY N/A 07/23/2017   Procedure: APPENDECTOMY LAPAROSCOPIC;  Surgeon: Georganna Skeans, MD;  Location: [facility];  Service: General;  Laterality: N/A;   Allergies  Allergen Reactions  . Lisinopril Swelling  . Varenicline Dermatitis, Rash and Other (See Comments)    Described as an "acne like rash" on his face Also noted significant GI intolerance   No current facility-administered medications on file prior to encounter.   Current Outpatient Medications on File Prior to Encounter  Medication Sig Dispense Refill  . Albuterol  Sulfate (PROAIR RESPICLICK) 174 (90 Base) MCG/ACT AEPB Inhale 2 puffs into the lungs every 4 (four) hours as needed (wheezing; cough). 1 each 0  . amLODipine (NORVASC) 5 MG tablet Take 5 mg by mouth at bedtime.  4  . baclofen (LIORESAL) 10 MG tablet Take 10 mg by mouth 3 (three) times daily.    . benztropine (COGENTIN) 0.5 MG tablet Take 0.5 mg by mouth 2 (two) times daily.    [name] buPROPion (WELLBUTRIN XL) 150 MG 24 hr tablet Take 1 tablet (150 mg total) by mouth daily. Take 1 tablet QAM for 2 weeks then increase to 2 tablets QAM. 30 tablet 1  . diclofenac Sodium (VOLTAREN) 1 % GEL Apply 1 application topically 3 (three) times daily.    [name] FOLIC ACID PO Take by mouth.    . hydrochlorothiazide (HYDRODIURIL) 25 MG tablet Take 25 mg by mouth daily.    . Methotrexate, Anti-Rheumatic, (METHOTREXATE, PF, [place]) Inject 1 mL into the skin once a week.     . metoprolol tartrate (LOPRESSOR) 50 MG tablet Take 50 mg by mouth 2 (two) times daily.  4  . Na Sulfate-K Sulfate-Mg Sulf (SUPREP BOWEL PREP KIT) 17.5-3.13-1.6 GM/177ML SOLN Take 1 kit by mouth as directed. 324 mL 0  . nicotine (NICOTINE STEP 1) 21 mg/24hr patch Place 1 patch (21 mg total) onto the  skin daily. 28 patch 1  . pantoprazole (PROTONIX) 40 MG tablet Take 1 tablet (40 mg total) by mouth daily. 30 tablet 3  . simvastatin (ZOCOR) 40 MG tablet Take 40 mg by mouth daily.    . Adalimumab (HUMIRA) 40 MG/0.8ML PSKT Inject 0.8 mLs (40 mg total) into the skin once a week. tuesdays (Patient taking differently: Inject 0.8 mLs into the skin once a week. Sunday) 3.2 mL 11   Social History   Socioeconomic History  . Marital status: Single    Spouse name: Not on file  . Number of children: Not on file  . Years of education: Not on file  . Highest education level: Not on file  Occupational History  . Not on file  Tobacco Use  . Smoking status: Current Every Day Smoker    Packs/day: 2.00    Years: 34.00    Pack years: 68.00    Types: Cigarettes    Start  date: 02/12/1984  . Smokeless tobacco: Former Systems developer    Quit date: 10/24/2012  . Tobacco comment: 1.5-2ppd  Quit attempt 12/24/2018  Substance and Sexual Activity  . Alcohol use: Yes    Comment: socially  . Drug use: Not Currently    Types: "Crack" cocaine, Cocaine    Comment: last use 07/26/13  . Sexual activity: Never  Other Topics Concern  . Not on file  Social History Narrative  . Not on file   Social Determinants of Health   Financial Resource Strain:   . Difficulty of Paying Living Expenses: Not on file  Food Insecurity:   . Worried About Charity fundraiser in the Last Year: Not on file  . Ran Out of Food in the Last Year: Not on file  Transportation Needs:   . Lack of Transportation (Medical): Not on file  . Lack of Transportation (Non-Medical): Not on file  Physical Activity:   . Days of Exercise per Week: Not on file  . Minutes of Exercise per Session: Not on file  Stress:   . Feeling of Stress : Not on file  Social Connections:   . Frequency of Communication with Friends and Family: Not on file  . Frequency of Social Gatherings with Friends and Family: Not on file  . Attends Religious Services: Not on file  . Active Member of Clubs or Organizations: Not on file  . Attends Archivist Meetings: Not on file  . Marital Status: Not on file  Intimate Partner Violence:   . Fear of Current or Ex-Partner: Not on file  . Emotionally Abused: Not on file  . Physically Abused: Not on file  . Sexually Abused: Not on file   Family History  Problem Relation Age of Onset  . Heart attack Mother   . Atrial fibrillation Mother   . Skin cancer Father     OBJECTIVE:  Vitals:   03/29/19 1043  BP: 139/87  Pulse: 77  Resp: 18  Temp: 98.2 F (36.8 C)  TempSrc: Oral  SpO2: 99%    General appearance: ALERT; in no acute distress.  Head: NCAT Lungs: Normal respiratory effort CV: XX pulses 2+ bilaterally. Cap refill < 2 seconds Musculoskeletal:  Inspection: Skin  warm, dry, clear and intact without obvious erythema, effusion, or ecchymosis.  Palpation: Nontender to palpation ROM: FROM active and passive Strength: 5/5 shld abduction, 5/5 shld adduction, 5/5 elbow flexion, 5/5 elbow extension, 5/5 grip strength, 5/5 hip flexion, 5/5 knee abduction, 5/5 knee adduction, 5/5 knee flexion, 5/5  knee extension, 5/5 dorsiflexion, 5/5 plantar flexion Stability: Anterior/ posterior drawer intact Skin: warm and dry Neurologic: Ambulates without difficulty; Sensation intact about the upper/ lower extremities Psychological: alert and cooperative; normal mood and affect  DIAGNOSTIC STUDIES:  DG Hip Unilat With Pelvis 2-3 Views Left  Result Date: 03/29/2019 CLINICAL DATA:  Left-sided hip pain for several days, no known injury, initial encounter EXAM: DG HIP (WITH OR WITHOUT PELVIS) 2-3V LEFT COMPARISON:  None. FINDINGS: Right hip prosthesis is noted. Pelvic ring is intact. Mild degenerative changes of the lumbar spine are seen. Slight increased sclerosis is noted in the superior aspect of the left femoral head which could be related to underlying avascular necrosis. Mild osteophytic changes are seen as well. IMPRESSION: Changes suggestive of early avascular necrosis. Electronically Signed   By: Inez Catalina M.D.   On: 03/29/2019 11:49     ASSESSMENT & PLAN:  1. Left hip pain   2. Pain   3. Avascular necrosis of femoral head, left (HCC)      Meds ordered this encounter  Medications  . ketorolac (TORADOL) injection 60 mg    Continue conservative management of rest, ice, and gentle stretches Take tramadol as needed for pain relief  Take cyclobenzaprine at nighttime for symptomatic relief. Avoid driving or operating heavy machinery while using medication. Follow up with PCP if symptoms persist Return or go to the ER if you have any new or worsening symptoms (fever, chills, chest pain, abdominal pain, changes in bowel or bladder habits, pain radiating into lower  legs, etc...)   [place] Controlled Substances Registry consulted for this patient. I feel the risk/benefit ratio today is favorable for proceeding with this prescription for a controlled substance. Medication sedation precautions given.  Reviewed expectations re: course of current medical issues. Questions answered. Outlined signs and symptoms indicating need for more acute intervention. Patient verbalized understanding. After Visit Summary given.       Faustino Congress, NP 03/29/19 1207

## 2019-03-29 NOTE — ED Triage Notes (Signed)
Pt reports left hip pain x4 days.  Pt states he has a chronic issue with both hips but he has had a flare up of pain in the last four days.

## 2019-03-29 NOTE — Discharge Instructions (Addendum)
Take the prescribed tramadol as needed for your pain.  Take the muscle relaxer Flexeril as needed for muscle spasm; do not drive, operate machinery, or drink alcohol with this medication as it may make you drowsy.    Follow up with an orthopedist or your primary care provider if your pain is not improving.  Go to the emergency department if you have worsening pain or develop new symptoms such as difficulty with urination, weakness, numbness, loss of control of your bladder or bowels, fever, chills or other concerns.

## 2019-03-30 ENCOUNTER — Telehealth: Payer: Self-pay | Admitting: Pharmacist

## 2019-03-30 DIAGNOSIS — F17200 Nicotine dependence, unspecified, uncomplicated: Secondary | ICD-10-CM

## 2019-03-30 NOTE — Telephone Encounter (Signed)
-----   Message from Leavy Cella, [facility] sent at 03/17/2019 12:19 PM EST ----- Regarding: Tobacco Cessation on 300mg  bupropion XL

## 2019-03-30 NOTE — Assessment & Plan Note (Signed)
Call RE Tobacco reduction/cessation.   Patient reports continued use of 3-5 cigarettes per day.  He states he purchased a pack of cigarettes yesterday.    He remains committed to quitting at this time.   He did not have a specific quit day however, he states he is planning to throw his cigarettes out of the car window someday soon because they don't taste the same.   Encouraged to consider longer periods of time (30 minutes) after a single meal (lunch suggested) as a way to cut down one more step.    He plans to continue with bupropion 300xl in the AM daily at this time.  He continues to work on cutting down.   Planned phone follow-up in 1 week.

## 2019-03-30 NOTE — Telephone Encounter (Signed)
Call RE Tobacco reduction/cessation.   Patient reports continued use of 3-5 cigarettes per day.  He states he purchased a pack of cigarettes yesterday.    He remains committed to quitting at this time.   He did not have a specific quit day however, he states he is planning to throw his cigarettes out of the car window someday soon because they don't taste the same.   Encouraged to consider longer periods of time (30 minutes) after a single meal (lunch suggested) as a way to cut down one more step.    He plans to continue with bupropion 300xl in the AM daily at this time.  He continues to work on cutting down.   Planned phone follow-up in 1 week.

## 2019-04-02 ENCOUNTER — Ambulatory Visit (INDEPENDENT_AMBULATORY_CARE_PROVIDER_SITE_OTHER): Payer: Medicaid Other

## 2019-04-02 ENCOUNTER — Other Ambulatory Visit: Payer: Self-pay

## 2019-04-02 ENCOUNTER — Other Ambulatory Visit: Payer: Self-pay | Admitting: Gastroenterology

## 2019-04-02 DIAGNOSIS — Z1159 Encounter for screening for other viral diseases: Secondary | ICD-10-CM

## 2019-04-03 LAB — SARS CORONAVIRUS 2 (TAT 6-24 HRS): SARS Coronavirus 2: NEGATIVE

## 2019-04-05 ENCOUNTER — Encounter: Payer: Self-pay | Admitting: Family Medicine

## 2019-04-05 ENCOUNTER — Other Ambulatory Visit: Payer: Self-pay

## 2019-04-05 ENCOUNTER — Ambulatory Visit (INDEPENDENT_AMBULATORY_CARE_PROVIDER_SITE_OTHER): Payer: Medicaid Other | Admitting: Family Medicine

## 2019-04-05 VITALS — BP 124/84 | HR 59 | Wt 257.2 lb

## 2019-04-05 DIAGNOSIS — M5442 Lumbago with sciatica, left side: Secondary | ICD-10-CM | POA: Diagnosis present

## 2019-04-05 DIAGNOSIS — M25552 Pain in left hip: Secondary | ICD-10-CM | POA: Diagnosis not present

## 2019-04-05 DIAGNOSIS — G8929 Other chronic pain: Secondary | ICD-10-CM

## 2019-04-05 MED ORDER — MELOXICAM 15 MG PO TABS: 15.0000 mg | ORAL_TABLET | Freq: Every day | ORAL | 0 refills | Status: DC

## 2019-04-05 MED ORDER — MELOXICAM 15 MG PO TABS: 15.0000 mg | ORAL_TABLET | Freq: Every day | ORAL | 0 refills | Status: AC

## 2019-04-05 NOTE — Patient Instructions (Signed)
It was great to see you today! Thank you for letting me participate in your care!  Today, we discussed your continued low back, left hip pain which I think is possibly from a combination of things. You do have some degenerative changes on your left hip x-ray suggestive of avascular necrosis/osteoarthritis of the left hip. I will refer you to orthopedic surgery at [facility] for possible surgical intervention. I also think you have sciatica and I have given you Meloxicam 15mg  for 14 days along with a stretching and exercise routine. Please take both as prescribed. I have also referred you to a pain clinic for your chronic low back pain. Follow up as needed.  Be well, Harolyn Rutherford, DO PGY-3, [facility]

## 2019-04-06 ENCOUNTER — Encounter: Payer: Self-pay | Admitting: Gastroenterology

## 2019-04-06 ENCOUNTER — Ambulatory Visit (AMBULATORY_SURGERY_CENTER): Payer: Medicaid Other | Admitting: Gastroenterology

## 2019-04-06 VITALS — BP 124/69 | HR 55 | Temp 96.8°F | Resp 10 | Ht 68.0 in | Wt 258.0 lb

## 2019-04-06 DIAGNOSIS — D123 Benign neoplasm of transverse colon: Secondary | ICD-10-CM | POA: Diagnosis not present

## 2019-04-06 DIAGNOSIS — K5730 Diverticulosis of large intestine without perforation or abscess without bleeding: Secondary | ICD-10-CM | POA: Diagnosis not present

## 2019-04-06 DIAGNOSIS — M25552 Pain in left hip: Secondary | ICD-10-CM | POA: Insufficient documentation

## 2019-04-06 DIAGNOSIS — R197 Diarrhea, unspecified: Secondary | ICD-10-CM | POA: Diagnosis not present

## 2019-04-06 MED ORDER — SODIUM CHLORIDE 0.9 % IV SOLN: 500.0000 mL | Freq: Once | INTRAVENOUS | Status: DC

## 2019-04-06 NOTE — Progress Notes (Signed)
Report to PACU, RN, vss, BBS= Clear.  

## 2019-04-06 NOTE — Assessment & Plan Note (Signed)
Patient most likely has pain from possible early onset Avascular necrosis of the left hip. His other symptoms are most likely from sciatica. - Stretches and exercises and Meloxican 15mg  for 14 days for sciatica pain - Referral to ortho for evaluation of avascular necrosis of the left hip and for possible surgical intervention

## 2019-04-06 NOTE — Progress Notes (Signed)
Temp by LC, Vitals by DT

## 2019-04-06 NOTE — Patient Instructions (Signed)
Handouts provided on polyps and diverticulosis.   YOU HAD AN ENDOSCOPIC PROCEDURE TODAY AT THE [facility]:   Refer to the procedure report that was given to you for any specific questions about what was found during the examination.  If the procedure report does not answer your questions, please call your gastroenterologist to clarify.  If you requested that your care partner not be given the details of your procedure findings, then the procedure report has been included in a sealed envelope for you to review at your convenience later.  YOU SHOULD EXPECT: Some feelings of bloating in the abdomen. Passage of more gas than usual.  Walking can help get rid of the air that was put into your GI tract during the procedure and reduce the bloating. If you had a lower endoscopy (such as a colonoscopy or flexible sigmoidoscopy) you may notice spotting of blood in your stool or on the toilet paper. If you underwent a bowel prep for your procedure, you may not have a normal bowel movement for a few days.  Please Note:  You might notice some irritation and congestion in your nose or some drainage.  This is from the oxygen used during your procedure.  There is no need for concern and it should clear up in a day or so.  SYMPTOMS TO REPORT IMMEDIATELY:   Following lower endoscopy (colonoscopy or flexible sigmoidoscopy):  Excessive amounts of blood in the stool  Significant tenderness or worsening of abdominal pains  Swelling of the abdomen that is new, acute  Fever of 100F or higher  For urgent or emergent issues, a gastroenterologist can be reached at any hour by calling [phone].   DIET:  We do recommend a small meal at first, but then you may proceed to your regular diet.  Drink plenty of fluids but you should avoid alcoholic beverages for 24 hours.  ACTIVITY:  You should plan to take it easy for the rest of today and you should NOT DRIVE or use heavy machinery until tomorrow (because  of the sedation medicines used during the test).    FOLLOW UP: Our staff will call the number listed on your records 48-72 hours following your procedure to check on you and address any questions or concerns that you may have regarding the information given to you following your procedure. If we do not reach you, we will leave a message.  We will attempt to reach you two times.  During this call, we will ask if you have developed any symptoms of COVID 19. If you develop any symptoms (ie: fever, flu-like symptoms, shortness of breath, cough etc.) before then, please call [phone].  If you test positive for Covid 19 in the 2 weeks post procedure, please call and report this information to us.    If any biopsies were taken you will be contacted by phone or by letter within the next 1-3 weeks.  Please call us at [phone] if you have not heard about the biopsies in 3 weeks.    SIGNATURES/CONFIDENTIALITY: You and/or your care partner have signed paperwork which will be entered into your electronic medical record.  These signatures attest to the fact that that the information above on your After Visit Summary has been reviewed and is understood.  Full responsibility of the confidentiality of this discharge information lies with you and/or your care-partner.  

## 2019-04-06 NOTE — Progress Notes (Signed)
    SUBJECTIVE:   CHIEF COMPLAINT / HPI: Left Hip Pain  Left Hip Pain Patient reporting progressively worsening left hip pain for the past two weeks. Was seen in Urgent Care and x-rays were consistent with early onset avascular necrosis of the hip. He had this in his right hip and he states it feels the same. He is also complaining of low back and left sided butt pain that radiates down the back of his leg to his knee. No numbness or tingling, no falls, no loss of balance. Pain is now everyday and is described as an ache that feels more "inside his hip". Requests to be sent to [facility].  PERTINENT  PMH / PSH: Chronic Back Pain, HTN, Hx of Alcohol use disorder, RA, Hx of right hip avascular necrosis, HLD, bipolar disorder  OBJECTIVE:   BP 124/84   Pulse (!) 59   Wt 257 lb 3.2 oz (116.7 kg)   SpO2 98%   BMI 39.11 kg/m   Gen: Alert and Oriented x 3, NAD MSK: Hip, Left: No obvious rash, erythema, ecchymosis, or edema. ROM full in all directions (IR: 80/ ER: 80/Flex: 120/Ext: 100/Abd: 45/Add: 45); Strength 4/5 in IR/ER/Flex/Ext/Abd/Add. Pelvic alignment unremarkable to inspection and palpation. Standing hip rotation and gait without trendelenburg / unsteadiness. Greater trochanter without tenderness to palpation. No tenderness over piriformis. No SI joint tenderness and normal minimal SI movement. Positive straight leg raise test Ext: no clubbing, cyanosis, or edema Skin: warm, dry, intact, no rashes  ASSESSMENT/PLAN:   Left hip pain Patient most likely has pain from possible early onset Avascular necrosis of the left hip. His other symptoms are most likely from sciatica. - Stretches and exercises and Meloxican 15mg  for 14 days for sciatica pain - Referral to ortho for evaluation of avascular necrosis of the left hip and for possible surgical intervention    [name], DO, PGY-3 [place]

## 2019-04-06 NOTE — Op Note (Signed)
[facility] Patient Name: Blake Arnold Procedure Date: 04/06/2019 1:52 PM MRN: [id number] Endoscopist: Milus Banister , MD Age: 49 Referring MD:  Date of Birth: November 04, 1970 Gender: Male Account #: [id number] Procedure:                Colonoscopy Indications:              Hematochezia, loose stools, abdominal pains. Medicines:                Monitored Anesthesia Care Procedure:                Pre-Anesthesia Assessment:                           - Prior to the procedure, a History and Physical                            was performed, and patient medications and                            allergies were reviewed. The patient's tolerance of                            previous anesthesia was also reviewed. The risks                            and benefits of the procedure and the sedation                            options and risks were discussed with the patient.                            All questions were answered, and informed consent                            was obtained. Prior Anticoagulants: The patient has                            taken no previous anticoagulant or antiplatelet                            agents. ASA Grade Assessment: II - A patient with                            mild systemic disease. After reviewing the risks                            and benefits, the patient was deemed in                            satisfactory condition to undergo the procedure.                           After obtaining informed consent, the colonoscope  was passed under direct vision. Throughout the                            procedure, the patient's blood pressure, pulse, and                            oxygen saturations were monitored continuously. The                            Colonoscope was introduced through the anus and                            advanced to the the terminal ileum. The colonoscopy                            was performed  without difficulty. The patient                            tolerated the procedure well. The quality of the                            bowel preparation was good. The terminal ileum,                            ileocecal valve, appendiceal orifice, and rectum                            were photographed. Scope In: 1:57:36 PM Scope Out: 2:10:09 PM Scope Withdrawal Time: 0 hours 9 minutes 59 seconds  Total Procedure Duration: 0 hours 12 minutes 33 seconds  Findings:                 The terminal ileum appeared normal.                           Multiple small and large-mouthed diverticula were                            found in the left colon.                           Three sessile polyps were found in the transverse                            colon. The polyps were 3 to 5 mm in size. These                            polyps were removed with a cold snare. Resection                            and retrieval were complete. (jar 2)                           The colon mucosa was slightly inflammed appearing;  granular and slightly ertythematous throughout                            however this seems most prominant in the                            recto-sigmoid region. Biopsies were taken from                            right colon (jar 1) and left colon (jar 3).                           The exam was otherwise without abnormality on                            direct and retroflexion views. Complications:            No immediate complications. Estimated blood loss:                            None. Estimated Blood Loss:     Estimated blood loss: none. Impression:               - The examined portion of the ileum was normal.                           - Diverticulosis in the left colon.                           - Three 3 to 5 mm polyps in the transverse colon,                            removed with a cold snare. Resected and retrieved.                           - The  colon mucosa was slightly inflammed                            appearing; granular and slightly ertythematous                            throughout however this seems most prominant in the                            recto-sigmoid region. Biopsies taken from right and                            left colon. Recommendation:           - Patient has a contact number available for                            emergencies. The signs and symptoms of potential  delayed complications were discussed with the                            patient. Return to normal activities tomorrow.                            Written discharge instructions were provided to the                            patient.                           - Resume previous diet.                           - Continue present medications.                           - Await pathology results. Milus Banister, MD 04/06/2019 2:16:57 PM This report has been signed electronically.

## 2019-04-08 ENCOUNTER — Encounter: Payer: Self-pay | Admitting: Physical Medicine and Rehabilitation

## 2019-04-08 ENCOUNTER — Telehealth: Payer: Self-pay | Admitting: Pharmacist

## 2019-04-08 ENCOUNTER — Telehealth: Payer: Self-pay | Admitting: *Deleted

## 2019-04-08 NOTE — Telephone Encounter (Signed)
Contacted patient to track progress with smoking cessation.  Patient was in good spirits and is motivated to quit smoking.   Patient reports that he has reduced the amount of cigarettes as they are starting to taste bad.  He reports taking a few puffs and then throwing them away.   He reports that boredom is a trigger for smoking in the evenings, but hopefully this will be reduced as he is currently job hunting and will have a busier schedule soon.   We agreed to a follow up call next week.

## 2019-04-08 NOTE — Telephone Encounter (Signed)
  Follow up Call-  Call back number 04/06/2019  Post procedure Call Back phone  # [phone]  Permission to leave phone message Yes  Some recent data might be hidden     Patient questions:  Do you have a fever, pain , or abdominal swelling? No. Pain Score  0 *  Have you tolerated food without any problems? Yes.    Have you been able to return to your normal activities? Yes.    Do you have any questions about your discharge instructions: Diet   No. Medications  No. Follow up visit  No.  Do you have questions or concerns about your Care? No.  Actions: * If pain score is 4 or above: No action needed, pain <4.   1. Have you developed a fever since your procedure? no  2.   Have you had an respiratory symptoms (SOB or cough) since your procedure? no  3.   Have you tested positive for COVID 19 since your procedure no  4.   Have you had any family members/close contacts diagnosed with the COVID 19 since your procedure?  no   If yes to any of these questions please route to Joylene John, RN and Alphonsa Gin, Therapist, sports.

## 2019-04-12 ENCOUNTER — Encounter: Payer: Self-pay | Admitting: Gastroenterology

## 2019-04-15 ENCOUNTER — Telehealth: Payer: Self-pay | Admitting: Pharmacist

## 2019-04-15 DIAGNOSIS — F17200 Nicotine dependence, unspecified, uncomplicated: Secondary | ICD-10-CM

## 2019-04-15 NOTE — Assessment & Plan Note (Signed)
Contacted patient to evaluate progress on tobacco cessation.    Patient reports no smoking for last two days after having two teeth pulled.  He is wearing the 21mg  nicotine patch and is still using bupropion.  He expressed some moderate level confidence in staying quit for two more days.   He has taken a job 6 days per week and is excited to be working again.  He believes that will help him to refrain from smoking.    Continue bupropion and nicotine patch 21mg  daily at this time.

## 2019-04-15 NOTE — Telephone Encounter (Signed)
Contacted patient to evaluate progress on tobacco cessation.    Patient reports no smoking for last two days after having two teeth pulled.  He is wearing the 21mg  nicotine patch and is still using bupropion.  He expressed some moderate level confidence in staying quit for two more days.   He has taken a job 6 days per week and is excited to be working again.  He believes that will help him to refrain from smoking.    Continue bupropion and nicotine patch 21mg  daily at this time.   Phone follow-up planned in 5 days (plan to call after 5:00 PM) - end of work day.

## 2019-04-29 ENCOUNTER — Encounter: Payer: Medicaid Other | Admitting: Physical Medicine and Rehabilitation

## 2019-04-30 ENCOUNTER — Encounter: Payer: Self-pay | Admitting: Physical Medicine and Rehabilitation

## 2019-04-30 ENCOUNTER — Other Ambulatory Visit: Payer: Self-pay

## 2019-04-30 ENCOUNTER — Encounter
Payer: Medicaid Other | Attending: Physical Medicine and Rehabilitation | Admitting: Physical Medicine and Rehabilitation

## 2019-04-30 VITALS — BP 148/93 | HR 94 | Temp 97.7°F | Ht 68.0 in | Wt 253.0 lb

## 2019-04-30 DIAGNOSIS — M25561 Pain in right knee: Secondary | ICD-10-CM | POA: Insufficient documentation

## 2019-04-30 DIAGNOSIS — G8929 Other chronic pain: Secondary | ICD-10-CM | POA: Diagnosis present

## 2019-04-30 DIAGNOSIS — M542 Cervicalgia: Secondary | ICD-10-CM | POA: Insufficient documentation

## 2019-04-30 DIAGNOSIS — M170 Bilateral primary osteoarthritis of knee: Secondary | ICD-10-CM | POA: Insufficient documentation

## 2019-04-30 DIAGNOSIS — M25562 Pain in left knee: Secondary | ICD-10-CM | POA: Diagnosis present

## 2019-04-30 DIAGNOSIS — M25552 Pain in left hip: Secondary | ICD-10-CM | POA: Insufficient documentation

## 2019-04-30 MED ORDER — PREGABALIN 25 MG PO CAPS: 25.0000 mg | ORAL_CAPSULE | Freq: Every day | ORAL | 0 refills | Status: DC

## 2019-04-30 NOTE — Progress Notes (Signed)
Subjective:    Patient ID: Blake Arnold, male    DOB: 1970/11/20, 49 y.o.   MRN: [id number]  HPI  Blake Arnold is a 49 year old man who presents with chronic pain in multiple joins including his bilateral shoulders, knees, hips, and legs. He has been diagnosed with both rheutmatoid and osteoarthritis. He is on methotrexate for his RA  His worst pain is currently his right knee pain. His pain has been present for 30 years but the knee pain has been aggravating him recently. He has tried Mobic, Ibuprofen, Tylenol, Tramadol Roxicodone. He has ben on 4000mg  of Gabapentin without any benefit. He has never tried Lyrica. 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 He has never heard of Synvisc injections. His last imaging of his knees was 2-3 years ago.   Pain Inventory Average Pain 6 Pain Right Now 8 My pain is sharp, stabbing, tingling and aching  In the last 24 hours, has pain interfered with the following? General activity 6 Relation with others 4 Enjoyment of life 5 What TIME of day is your pain at its worst? night Sleep (in general) Fair  Pain is worse with: bending, sitting and some activites Pain improves with: rest and pacing activities Relief from Meds: 1  Mobility walk without assistance ability to climb steps?  no do you drive?  yes  Function employed # of hrs/week 40 disabled: date disabled 2017  Neuro/Psych numbness tingling trouble walking  Prior Studies Any changes since last visit?  no  Physicians involved in your care Any changes since last visit?  no   Family History  Problem Relation Age of Onset  . Heart attack Mother   . Atrial fibrillation Mother   . Skin cancer Father   . Colon polyps Neg Hx   . Esophageal cancer Neg Hx   . Stomach cancer Neg Hx   . Rectal cancer Neg Hx    Social History   Socioeconomic History  . Marital status: Single    Spouse name: Not on file  . Number of children: Not on file  .  Years of education: Not on file  . Highest education level: Not on file  Occupational History  . Not on file  Tobacco Use  . Smoking status: Current Every Day Smoker    Packs/day: 2.00    Years: 34.00    Pack years: 68.00    Types: Cigarettes    Start date: 02/12/1984  . Smokeless tobacco: Former Systems developer    Quit date: 10/24/2012  . Tobacco comment: 1.5-2ppd  Quit attempt 12/24/2018  Substance and Sexual Activity  . Alcohol use: Yes    Comment: socially  . Drug use: Not Currently    Types: "Crack" cocaine, Cocaine    Comment: last use 07/26/13  . Sexual activity: Never  Other Topics Concern  . Not on file  Social History Narrative  . Not on file   Social Determinants of Health   Financial Resource Strain:   . Difficulty of Paying Living Expenses:   Food Insecurity:   . Worried About Charity fundraiser in the Last Year:   . Arboriculturist in the Last Year:   Transportation Needs:   . Film/video editor (Medical):   [name] Lack of Transportation (Non-Medical):   Physical Activity:   . Days of Exercise per Week:   . Minutes of Exercise per Session:   Stress:   . Feeling of Stress :  Social Connections:   . Frequency of Communication with Friends and Family:   . Frequency of Social Gatherings with Friends and Family:   . Attends Religious Services:   . Active Member of Clubs or Organizations:   . Attends Archivist Meetings:   [name] Marital Status:    Past Surgical History:  Procedure Laterality Date  . APPENDECTOMY    . CARDIOVERSION  03/07/2006  . CARPAL TUNNEL RELEASE    . CERVICAL FUSION    . LAPAROSCOPIC APPENDECTOMY N/A 07/23/2017   Procedure: APPENDECTOMY LAPAROSCOPIC;  Surgeon: Georganna Skeans, MD;  Location: [place];  Service: General;  Laterality: N/A;  . TOTAL HIP ARTHROPLASTY Right 2016   Past Medical History:  Diagnosis Date  . A-fib ([place])   . Acid reflux   . Alcohol abuse   . Anxiety   . Asthma   . AVN (avascular necrosis of bone) ([place])   .  Chronic back pain   . COPD (chronic obstructive pulmonary disease) ([place])   . Depression   . Hepatitis C    history of  . History of urinary frequency   . Hypertension   . Peripheral neuropathy    hands and feet  . Polysubstance abuse ([place]) 01/25/2011  . 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[place])   . 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[place])   . Seizures ([facility])    BP (!) 148/93   Pulse 94   Temp 97.7 F (36.5 C)   Ht 5\' 8"  (1.727 m)   Wt 253 lb (114.8 kg)   SpO2 97%   BMI 38.47 kg/m   Opioid Risk Score:   Fall Risk Score:  `1  Depression screen PHQ 2/9  Depression screen [facility] 2/9 04/05/2019 02/25/2019 01/18/2019 01/18/2019 09/01/2018  Decreased Interest 0 0 1 2 0  Down, Depressed, Hopeless 0 0 0 0 0  PHQ - 2 Score 0 0 1 2 0  Altered sleeping - - 2 - -  Tired, decreased energy - - 3 - -  Change in appetite - - 1 - -  Feeling bad or failure about yourself  - - 0 - -  Trouble concentrating - - 2 - -  Moving slowly or fidgety/restless - - 0 - -  Suicidal thoughts - - 0 - -  PHQ-9 Score - - 9 - -  Difficult doing work/chores - - Very difficult - -     Review of Systems  Constitutional: Positive for diaphoresis and unexpected weight change.  HENT: Negative.   Eyes: Negative.   Respiratory: Positive for shortness of breath.   Cardiovascular: Negative.   Gastrointestinal: Negative.   Endocrine: Negative.   Genitourinary: Negative.   Musculoskeletal: Positive for arthralgias and gait problem.  Skin: Negative.   Allergic/Immunologic: Negative.   Neurological: Positive for numbness.  Hematological: Negative.   Psychiatric/Behavioral: Negative.   All other systems reviewed and are negative.      Objective:   Physical Exam Gen: no distress, normal appearing HEENT: oral mucosa pink and moist, NCAT Cardio: Reg rate Chest: normal effort, normal rate of breathing Abd: soft, non-distended Ext: no edema Skin: intact Neuro: AOx3.  Musculoskeletal: Normal ambulation. No tenderness to palpation of  bilateral knees but there is pain upon rising from sit to stand. No effusions.  Psych: pleasant, normal affect    Assessment & Plan:  Blake Arnold is a 49 year old man who presents with chronic pain in multiple joins including his bilateral shoulders, knees, hips, and legs. He has been diagnosed with both rheutmatoid  and osteoarthritis. 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 --Will obtain bilateral XRs as most recent were 2-3 years ago. Advised that he can get these done at [facility] walk-in and patient plans to on Monday. I will call him with results that day. --Plan for Monovisc injection based on results. Discussed that hyaluronic acid will help to regenerate the joint.   For his diffuse body pains, will trial Lyrica 25mg . Advised to call us next week regarding his response to medication, and we can adjust dose as needed.  All questions answered. RTC once auth received for Monovisc

## 2019-05-03 ENCOUNTER — Other Ambulatory Visit: Payer: Self-pay

## 2019-05-03 ENCOUNTER — Ambulatory Visit (HOSPITAL_COMMUNITY)
Admission: RE | Admit: 2019-05-03 | Discharge: 2019-05-03 | Disposition: A | Discharge status: Home / Self Care | Payer: Medicaid Other | Source: Ambulatory Visit | Attending: Physical Medicine and Rehabilitation | Admitting: Physical Medicine and Rehabilitation

## 2019-05-03 DIAGNOSIS — M170 Bilateral primary osteoarthritis of knee: Secondary | ICD-10-CM | POA: Diagnosis present

## 2019-05-06 ENCOUNTER — Telehealth: Payer: Self-pay | Admitting: Pharmacist

## 2019-05-06 NOTE — Telephone Encounter (Signed)
Contacted patient RE tobacco cessation.  Patient reports he currently has "4 young kids" living with him and he has been stressed.  (His sister recently broke her leg).  This has increased his smoking to ~ 10 per day.   He has intermittently used his nicotine patch which decreases his cravings and minimized his short-temper.  I encouraged him to use the nicotine patches and attempt to minimize smoking.   He also stated someone had given him VELO nicotine pouches.  We discussed how these could be used similar to lozenges.  Advised he could try to use the pouches in place of smoking.   Plan follow-up in 2 weeks.  Reassess quit plan at that time.

## 2019-05-07 ENCOUNTER — Encounter: Payer: Self-pay | Admitting: Physical Medicine and Rehabilitation

## 2019-05-07 ENCOUNTER — Other Ambulatory Visit: Payer: Self-pay

## 2019-05-07 ENCOUNTER — Encounter: Payer: Medicaid Other | Admitting: Physical Medicine and Rehabilitation

## 2019-05-07 VITALS — BP 147/93 | HR 67 | Temp 97.3°F | Ht 68.0 in | Wt 257.0 lb

## 2019-05-07 DIAGNOSIS — M170 Bilateral primary osteoarthritis of knee: Secondary | ICD-10-CM | POA: Diagnosis not present

## 2019-05-07 DIAGNOSIS — M25561 Pain in right knee: Secondary | ICD-10-CM | POA: Diagnosis not present

## 2019-05-07 DIAGNOSIS — G8929 Other chronic pain: Secondary | ICD-10-CM

## 2019-05-07 DIAGNOSIS — M25562 Pain in left knee: Secondary | ICD-10-CM

## 2019-05-07 DIAGNOSIS — M25552 Pain in left hip: Secondary | ICD-10-CM | POA: Diagnosis not present

## 2019-05-07 DIAGNOSIS — M542 Cervicalgia: Secondary | ICD-10-CM

## 2019-05-07 NOTE — Progress Notes (Signed)
Subjective:    Patient ID: Blake Arnold, male    DOB: February 11, 1971, 49 y.o.   MRN: [id number]  HPI  Blake Arnold is a 49 year old man who presents with chronic pain in multiple joints including his bilateral shoulders, knees, hips, and legs. He has been diagnosed with both rheumatoid arthritis and osteoarthritis. He is on methotrexate for his RA.    His worst pain is currently his right knee pain. His pain has been present for 30 years but the knee pain has been aggravating him recently. He has tried Mobic, Ibuprofen, Tylenol, Tramadol, Roxicodone, Vicodin. He has been on 4000mg  of Gabapentin without any benefit. I prescribed Lyrica last visit and he did not tolerate it well. 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 I repeated XR of his knees last visit, personally reviewed and discussed with patient, and they show no evidence of OA.    Pain Inventory Average Pain 5 Pain Right Now 7 My pain is sharp, dull, stabbing and tingling  In the last 24 hours, has pain interfered with the following? General activity 8 Relation with others 8 Enjoyment of life 10 What TIME of day is your pain at its worst? morning and evening Sleep (in general) Fair  Pain is worse with: walking, bending, sitting and some activites Pain improves with: medication Relief from Meds: 0  Mobility how many minutes can you walk? 10 ability to climb steps?  no do you drive?  yes  Function not employed: date last employed . I need assistance with the following:  dressing, household duties and shopping  Neuro/Psych numbness tingling trouble walking anxiety  Prior Studies x-rays  Physicians involved in your care Any changes since last visit?  no   Family History  Problem Relation Age of Onset  . Heart attack Mother   . Atrial fibrillation Mother   . Skin cancer Father   . Colon polyps Neg Hx   . Esophageal cancer Neg Hx   . Stomach cancer Neg Hx   . Rectal cancer Neg  Hx    Social History   Socioeconomic History  . Marital status: Single    Spouse name: Not on file  . Number of children: Not on file  . Years of education: Not on file  . Highest education level: Not on file  Occupational History  . Not on file  Tobacco Use  . Smoking status: Current Every Day Smoker    Packs/day: 2.00    Years: 34.00    Pack years: 68.00    Types: Cigarettes    Start date: 02/12/1984  . Smokeless tobacco: Former Systems developer    Quit date: 10/24/2012  . Tobacco comment: 1.5-2ppd  Quit attempt 12/24/2018  Substance and Sexual Activity  . Alcohol use: Yes    Comment: socially  . Drug use: Not Currently    Types: "Crack" cocaine, Cocaine    Comment: last use 07/26/13  . Sexual activity: Never  Other Topics Concern  . Not on file  Social History Narrative  . Not on file   Social Determinants of Health   Financial Resource Strain:   . Difficulty of Paying Living Expenses:   Food Insecurity:   . Worried About Charity fundraiser in the Last Year:   . Arboriculturist in the Last Year:   Transportation Needs:   . Film/video editor (Medical):   [name] Lack of Transportation (Non-Medical):   Physical Activity:   . Days  of Exercise per Week:   . Minutes of Exercise per Session:   Stress:   . Feeling of Stress :   Social Connections:   . Frequency of Communication with Friends and Family:   . Frequency of Social Gatherings with Friends and Family:   . Attends Religious Services:   . Active Member of Clubs or Organizations:   . Attends Archivist Meetings:   [name] Marital Status:    Past Surgical History:  Procedure Laterality Date  . APPENDECTOMY    . CARDIOVERSION  03/07/2006  . CARPAL TUNNEL RELEASE    . CERVICAL FUSION    . LAPAROSCOPIC APPENDECTOMY N/A 07/23/2017   Procedure: APPENDECTOMY LAPAROSCOPIC;  Surgeon: Georganna Skeans, MD;  Location: [place];  Service: General;  Laterality: N/A;  . TOTAL HIP ARTHROPLASTY Right 2016   Past Medical History:   Diagnosis Date  . A-fib ([place])   . Acid reflux   . Alcohol abuse   . Anxiety   . Asthma   . AVN (avascular necrosis of bone) ([place])   . Chronic back pain   . COPD (chronic obstructive pulmonary disease) ([place])   . Depression   . Hepatitis C    history of  . History of urinary frequency   . Hypertension   . Peripheral neuropathy    hands and feet  . Polysubstance abuse ([place]) 01/25/2011  . 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[place])   . 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[place])   . Seizures ([facility])    BP (!) 147/93   Pulse 67   Temp (!) 97.3 F (36.3 C)   Ht 5\' 8"  (1.727 m)   Wt 257 lb (116.6 kg)   SpO2 97%   BMI 39.08 kg/m   Opioid Risk Score:   Fall Risk Score:  `1  Depression screen PHQ 2/9  Depression screen [facility] 2/9 04/05/2019 02/25/2019 01/18/2019 01/18/2019 09/01/2018  Decreased Interest 0 0 1 2 0  Down, Depressed, Hopeless 0 0 0 0 0  PHQ - 2 Score 0 0 1 2 0  Altered sleeping - - 2 - -  Tired, decreased energy - - 3 - -  Change in appetite - - 1 - -  Feeling bad or failure about yourself  - - 0 - -  Trouble concentrating - - 2 - -  Moving slowly or fidgety/restless - - 0 - -  Suicidal thoughts - - 0 - -  PHQ-9 Score - - 9 - -  Difficult doing work/chores - - Very difficult - -     Review of Systems  Musculoskeletal: Positive for gait problem.  Neurological: Positive for numbness.       Tingling  Psychiatric/Behavioral: The patient is nervous/anxious.   All other systems reviewed and are negative.      Objective:   Physical Exam Gen: no distress, normal appearing HEENT: oral mucosa pink and moist, NCAT Cardio: Reg rate Chest: normal effort, normal rate of breathing Abd: soft, non-distended Ext: no edema Skin: intact Neuro: AOx3.  Musculoskeletal: Normal ambulation. No tenderness to palpation of bilateral knees but there is pain upon rising from sit to stand. No effusions.  Psych: pleasant, normal affect    Assessment & Plan:  Blake Arnold is a 49 year old man who presents with  chronic pain in multiple joints including his bilateral shoulders, knees, hips, and legs. He has been diagnosed with both rheumatoid arthritis and osteoarthritis. He is on methotrexate for his RA.   1)Bilateral knee pain R>L: --XRs personally reviewed and discussed with  patient-show no evidence of OA.   2) Cervical spine pain: 3) Left hip AVN: --The only medication that he has found to be beneficial thus far is Oxycodone. He usually only takes on an as needed basis when pain is bad. He would like to be able to get back to work but his pain is currently too severe to tolerate this. He is looking for less physical work. We can prescribe 30 pills Oxycodone for him after he signs pain contract and we obtain UDS. States he recently took some Percocet from his sister so asks if he can come back in 2 weeks for UDS.   Morbid Obesity:  257lbs. Explained that every pound of weight is 6 pounds on the knees. Furthermore, obesity results in inflammation which increases pain. He likes to eat carbs, red meats, nuts, and to drink cola. Advised to cut down on his consumption of cola and carbs which are less nutritive and more inflammatory. Will continue to monitor his weight and weight reduction will be a large part of decreasing his pain.     Thirty minutes of face to face patient care time were spent during this visit. All questions were encouraged and answered. Follow up with me in 2 weeks.

## 2019-05-17 DIAGNOSIS — Z79899 Other long term (current) drug therapy: Principal | ICD-10-CM

## 2019-05-17 DIAGNOSIS — M059 Rheumatoid arthritis with rheumatoid factor, unspecified: Principal | ICD-10-CM

## 2019-05-25 ENCOUNTER — Encounter: Payer: Medicaid Other | Admitting: Physical Medicine and Rehabilitation

## 2019-06-02 ENCOUNTER — Ambulatory Visit: Payer: Medicaid Other

## 2019-06-02 ENCOUNTER — Other Ambulatory Visit: Payer: Self-pay

## 2019-06-02 ENCOUNTER — Ambulatory Visit (INDEPENDENT_AMBULATORY_CARE_PROVIDER_SITE_OTHER): Payer: Medicaid Other | Admitting: Family Medicine

## 2019-06-02 VITALS — BP 122/80 | HR 82 | Ht 68.0 in | Wt 253.4 lb

## 2019-06-02 DIAGNOSIS — M542 Cervicalgia: Secondary | ICD-10-CM

## 2019-06-02 MED ORDER — FAMOTIDINE 10 MG PO TABS: 10.0000 mg | ORAL_TABLET | Freq: Two times a day (BID) | ORAL | 0 refills | Status: DC

## 2019-06-02 NOTE — Assessment & Plan Note (Signed)
Neck pain around C7 prominence, perhaps incurred while sleeping in an awkward position. -Increase baclofen to 20 mg 3 times daily -Ibuprofen as needed pain -Follow-up with pain management for ongoing pain needs

## 2019-06-02 NOTE — Patient Instructions (Signed)
For your neck pain: -Increase the baclofen to 20 mg 3 times a day. -Take ibuprofen and Tylenol as needed -If you develop stomach irritation while taking ibuprofen you may take the famotidine in addition to your pantoprazole -Please follow-up with your pain management and orthopedic doctor for further evaluation  Please come back to the clinic or go to the emergency department if you develop any symptoms of weakness, extreme pain that does not have improvement with medication.

## 2019-06-02 NOTE — Progress Notes (Signed)
    SUBJECTIVE:   CHIEF COMPLAINT / HPI:   Cervicalgia Patient presents with 1 day of neck and upper back pain.  He woke up this morning with the pain.  Denies any trauma to the area.  It is close to where he has had cervical spine fusion of C5-C6.  Patient has history of chronic pain in multiple joints.  Follows with pain management outpatient setting.  Has tried multiple modalities for pain relief.  Currently on baclofen and ibuprofen.  This does seem to improve the pain of his neck.  Denies any weakness in his upper extremities.  He has never had pain like this before.  PERTINENT  PMH / PSH:  Chronic pain  OBJECTIVE:   BP 122/80   Pulse 82   Ht 5\' 8"  (1.727 m)   Wt 253 lb 6 oz (114.9 kg)   SpO2 97%   BMI 38.53 kg/m   Neck: Able to flex extend neck without issue, able to rotate head to the left and right without issue, tenderness below the C7 prominence, upper back muscles feel tight  ASSESSMENT/PLAN:   Cervicalgia Neck pain around C7 prominence, perhaps incurred while sleeping in an awkward position. -Increase baclofen to 20 mg 3 times daily -Ibuprofen as needed pain -Follow-up with pain management for ongoing pain needs     Bonnita Hollow, MD [place]

## 2019-06-02 NOTE — Unmapped (Signed)
[facility] Refill Coordination Note    Specialty Medication(s) to be Shipped:   Inflammatory Disorders: Humira and methotrexate (injectable)    Other medication(s) to be shipped: n/a     [name], DOB: 1970/07/01  Phone: [phone] (home) [phone] (work)      All above HIPAA information was verified with patient.     Was a Nurse, learning disability used for this call? No    Completed refill call assessment today to schedule patient's medication shipment from the [facility] [phone]).       Specialty medication(s) and dose(s) confirmed: Regimen is correct and unchanged.   Changes to medications: [name] reports no changes at this time.  Changes to insurance: No  Questions for the pharmacist: No    Confirmed patient received Welcome Packet with first shipment. The patient will receive a drug information handout for each medication shipped and additional FDA Medication Guides as required.       DISEASE/MEDICATION-SPECIFIC INFORMATION        For patients on injectable medications: Patient currently has 1 doses left.  Next injection is scheduled for 5/2.    SPECIALTY MEDICATION ADHERENCE     Medication Adherence    Patient reported X missed doses in the last month: 0  Specialty Medication: Humira  Patient is on additional specialty medications: Yes  Additional Specialty Medications: Methotrexate  Patient Reported Additional Medication X Missed Doses in the Last Month: 0  Patient is on more than two specialty medications: No  Any gaps in refill history greater than 2 weeks in the last 3 months: no  Demonstrates understanding of importance of adherence: yes  Informant: patient  Adherence tools used: calendar                Humira 40mg /0.69ml: Patient has 7 days of medication on hand    Methotrexate 25mg /ml: Patient has 14 days of medication on hand      SHIPPING     Shipping address confirmed in Epic.     Delivery Scheduled: Yes, Expected medication delivery date: 4/28. Medication will be delivered via UPS to the prescription address in Epic WAM.    Olga Millers   [facility] Specialty Technician

## 2019-06-04 ENCOUNTER — Telehealth: Payer: Self-pay | Admitting: Pharmacist

## 2019-06-04 DIAGNOSIS — F17200 Nicotine dependence, unspecified, uncomplicated: Secondary | ICD-10-CM

## 2019-06-04 NOTE — Assessment & Plan Note (Signed)
Attempted for second time this week to contact patient.   Left message requesting call back.  I hope he returns call.   Please send additional request to me in the future should be remain interested in quitting.   No additional follow-up planned from me until requested/patient follow-up.

## 2019-06-04 NOTE — Telephone Encounter (Signed)
Attempted for second time this week to contact patient.   Left message requesting call back.  I hope he returns call.   Please send additional request to me in the future should be remain interested in quitting.   No additional follow-up planned from me until requested/patient follow-up.

## 2019-06-08 ENCOUNTER — Encounter: Admit: 2019-06-08 | Discharge: 2019-06-08 | Payer: MEDICARE

## 2019-06-08 MED FILL — METHOTREXATE SODIUM 25 MG/ML INJECTION SOLUTION: 84 days supply | Qty: 12 | Fill #4 | Status: AC

## 2019-06-08 MED FILL — METHOTREXATE SODIUM 25 MG/ML INJECTION SOLUTION: SUBCUTANEOUS | 84 days supply | Qty: 12 | Fill #4

## 2019-06-08 MED FILL — HUMIRA PEN CITRATE FREE 40 MG/0.4 ML: SUBCUTANEOUS | 84 days supply | Qty: 12 | Fill #1

## 2019-06-08 MED FILL — HUMIRA PEN CITRATE FREE 40 MG/0.4 ML: 84 days supply | Qty: 12 | Fill #1 | Status: AC

## 2019-06-08 NOTE — Unmapped (Signed)
Your diagnosis: Hip pain and Total Hip Arthroplasty       Recommendations/Plan  ?? Medications: Take over-the-counter medications as needed and as tolerated  ?? If you don't have liver disease, the safest medication for pain is acetaminophen (Tylenol).  Take 1000mg  (2 extra strength tabs) at a time for pain relief.  Do not take more than 3000mg  per day.  ?? Activity: Activities as tolerated.  ?? Recommendations: Let's continue to monitor your symptoms.  ?? Imaging studies: Bilateral Hips (prior to next visit): AP/Frog Lat + COP Pelvis  ?? Follow-up: To be scheduled in about 6 month(s).         ?? Contact our team electronically via MyChart message to [facility] Clinical Staff.    ?? Contact our team at [phone] or [phone] with any questions/concerns.  ?? Contact our appointment center at [phone] if you need to schedule an appointment.  ??

## 2019-06-08 NOTE — Unmapped (Signed)
Patient: Joseph Sandoval  Date of birth: 1970/05/21  Sandoval number: [id number]  Date of clinic visit: 06/08/19  Primary care provider: [facility]      HISTORY OF PRESENT ILLNESS:  49 y.o. year-old male with a past medical history of atrial fibrillation not on anticoagulation, COPD, sleep apnea, depression, bipolar disorder, prior polysubstance abuse, rheumatoid arthritis on methotrexate, and prior treated hep C who presents for evaluation of left hip pain.  Patient reports his pain in the left hip has been going on for over 1 year but has significantly worsened this past year.  He describes it is predominantly over his lateral hip as well as in his buttock.  His pain is a 5/10 on average at rest and up to a 7/10 when walking or going up and down stairs.  Pain will occasionally wake him up from sleep at night.  Patient reports he has been unable to work secondary to his pain because he cannot stand or walk for prolonged periods of time.  He previously had a right total hip arthroplasty by Dr. Lanell Matar in 2016 through a posterior approach.  He reports occasional pain in this hip but otherwise is doing well.    For his left hip, he is taking Tylenol and anti-inflammatory medications and has modified his activities appropriately.  He has not yet tried injections or physical therapy.  He does occasionally use a cane to help ambulate.    Patient's past medical history significant for atrial fibrillation not on any rate controlling medications or blood thinning medications.  He does have a COPD history as well as sleep apnea but does not use a CPAP.  From a psychiatric standpoint, he has depression and bipolar disorder which he thinks were related to his prior polysubstance abuse.  He has been clean from drugs and alcohol for the past 7 years.  Additionally, the patient has a history of rheumatoid arthritis currently taking methotrexate and Humira.  Previous hepatitis C infection was treated. Patient has a history of a right posterior THA by Dr. Lanell Matar in 2016.  Additionally, he had a C5-7 ACDF by Dr. Jaynie Dettman in 2019.  Other orthopedic related surgeries include bilateral carpal tunnel releases by Dr. Waylan Boga.  No wound healing or infection related complication.    Patient currently resides in [place] where he grew up.  He previously worked in [employer] but has not worked since 2016 due to chronic pain related to his hips, neck, knees.  He is currently on disability.  Patient smokes 1/2 packs of cigarettes per day at the moment.  Clean from alcohol and drugs for 7 years.      Past Medical History:   Diagnosis Date   ??? Alcoholism (CMS-HCC)    ??? Anxiety    ??? Arthritis    ??? Asthma    ??? Atrial fibrillation (CMS-HCC)    ??? Bipolar affective (CMS-HCC)    ??? Bronchitis    ??? Depression    ??? Headache    ??? Hepatitis C    ??? Hyperlipidemia    ??? Hypertension    ??? Infectious viral hepatitis    ??? Joint pain    ??? Lung disease    ??? Polysubstance abuse (CMS-HCC)    ??? RA (rheumatoid arthritis) (CMS-HCC)    ??? Seizures (CMS-HCC)    ??? Shoulder injury    ??? Sleep apnea, obstructive    ??? Sleeping difficulties        Current Outpatient Medications  Medication Instructions   ??? acetaminophen (TYLENOL) 1,000 mg, Oral, Every 6 hours PRN   ??? albuterol HFA 90 mcg/actuation inhaler INHALE 2 PUFFS EVERY SIX (6) HOURS AS NEEDED FOR WHEEZING.   ??? albuterol HFA 90 mcg/actuation inhaler INHALE 2 PUFFS EVERY SIX (6) HOURS AS NEEDED FOR WHEEZING.   ??? amLODIPine (NORVASC) 10 mg, Oral, Daily (standard)   ??? baclofen (LIORESAL) 10 mg, Oral, 3 times a day (standard)   ??? benztropine (COGENTIN) 0.5 mg, Oral, 2 times a day (standard)   ??? buPROPion (WELLBUTRIN XL) 150 mg, Oral, Daily (standard)   ??? DULoxetine (CYMBALTA) 20 mg, Oral, Nightly   ??? empty container Misc Use as directed   ??? HUMIRA PEN CITRATE FREE 40 MG/0.4 ML Inject the contents of 1 pen (40mg ) under the skin every 7 days.   ??? hydroCHLOROthiazide (HYDRODIURIL) 25 mg, Oral, Daily (standard)   ??? methotrexate 25 mg, Subcutaneous, Every 7 days   ??? metoprolol tartrate (LOPRESSOR) 50 mg, Oral, 2 times a day (standard)   ??? mometasone-formoterol 200-5 mcg/actuation HFAA 2 puffs   ??? mometasone-formoterol 200-5 mcg/actuation HFAA INHALE 2 PUFFS TWO (2) TIMES A DAY.   ??? ranitidine (ZANTAC) 150 mg, Oral, 2 times a day (standard)   ??? risperiDONE (RISPERDAL) 2 mg, Oral, Nightly   ??? sertraline (ZOLOFT) 100 mg, Oral, Daily (standard)   ??? simvastatin (ZOCOR) 40 mg, Oral, Nightly   ??? syringe with needle 1 mL 25 gauge x 5/8 Syrg Use as directed to inject methotrexate       Allergies as of 06/08/2019 - Reviewed 03/15/2019   Allergen Reaction Noted   ??? Lisinopril Swelling 01/20/2015       Past Surgical History:   Procedure Laterality Date   ??? CARDIAC SURGERY      2008 shock therapy   ??? PR ALLOGRAFT FOR SPINE SURGERY ONLY MORSELIZED N/A 03/31/2017    Procedure: Allograft For Spine Surgery Only; Morselized;  Surgeon: Nemiah Commander, MD;  [facility];  Service: Ortho Spine   ??? PR ANTERIOR INSTRUMENTATION 2-3 VERTEBRAL SEGMENTS N/A 03/31/2017    Procedure: Ant Instrum; 2 To 3 Verteb Segmt Cervical;  Surgeon: Nemiah Commander, MD;  [facility]: [facility] OR [facility];  Service: Ortho Spine   ??? PR ARTHRODESIS ANT INTERBODY INC DISCECTOMY, CERVICAL BELOW C2 N/A 03/31/2017    Procedure: Arthrodes, Ant Intrbdy, Incl Disc Spc Prep, Discect, Osteophyt/Decompress Spinl Crd &/Or Nrv Rt, Crv Blo C2;  Surgeon: Nemiah Commander, MD;  [facility]: [facility] OR [facility];  Service: Ortho Spine   ??? PR ARTHRODESIS ANT INTERBODY INC DISCECTOMY, CERVICAL BELOW C2 EACH ADDL N/A 03/31/2017    Procedure: [place], Ant Intbdy, Incl Disc Spc Prep/Disctmy/Ostephyt/Decmpr Spnl Crd/Nrv Rt; Cerv Belo C2, Ea Add`L Spc;  Surgeon: Nemiah Commander, MD;  Location: [facility] OR [facility];  Service: Ortho Spine   ??? PR AUTOGRAFT SPINE SURGERY LOCAL FROM SAME INCISION N/A 03/31/2017    Procedure: Autograft/Spine Surg Only (W/Harvest Graft); Local (Eg, Rib/Spinous Proc, Ples Specter) Obtain From Same Incis;  Surgeon: Nemiah Commander, MD;  Location: [facility] OR [facility];  Service: Ortho Spine   ??? PR INSJ BIOMCHN DEV INTERVERTEBRAL DSC SPC W/ARTHRD N/A 03/31/2017    Procedure: Insert Interbody Biomechanical Device(S) With Integral Anterior Instrument For Device Anchoring, When Performed, To Intervertebral Disc Space In Conjunction With Interbody Arthrodesis, Each Interspace x2;  Surgeon: Nemiah Commander, MD;  Location: [facility] OR [facility];  Service: Ortho Spine   ??? PR IONM  1 ON 1 IN OR W/ATTENDANCE EACH 15 MINUTES N/A 03/31/2017    Procedure: Continuous Intraoperative Neurophysiology Monitoring In Or;  Surgeon: Nemiah Commander, MD;  [facility]: [facility] OR [facility];  Service: Ortho Spine   ??? PR NERVOUS SYSTEM SURGERY UNLISTED N/A 03/21/2016    Procedure: UNLISTED PROC NERVOUS SYSTEM;  Surgeon: Coralee Pesa, MD;  [facility]: [facility];  Service: Orthopedics   ??? PR RAD EXCIS WRIST SYNOV/TENDON,FLEXOR Left 03/21/2016    Procedure: RAD EXC BURSA WRIST TENDON SHEATHS; FLEXORS;  Surgeon: Coralee Pesa, MD;  Location: [facility];  Service: Orthopedics   ??? PR REVISE MEDIAN N/CARPAL TUNNEL SURG Left 10/08/2016    Procedure: NEUROPLASTY AND/OR TRANSPOSITION; MEDIAN NERVE AT CARPAL TUNNEL--MOD 22--REVISION;  Surgeon: Garnett Farm, MD;  [facility];  Service: Ortho Hand   ??? PR REVISE MEDIAN N/CARPAL TUNNEL SURG Right 10/22/2016    Procedure: NEUROPLASTY AND/OR TRANSPOSITION; MEDIAN NERVE AT CARPAL TUNNEL;  Surgeon: Garnett Farm, MD;  [facility];  Service: Ortho Hand   ??? PR TOTAL HIP ARTHROPLASTY Right 12/28/2015    Procedure: ARTHROPLASTY, ACETABULAR & PROXIMAL FEMORAL PROSTHETIC REPLACEMENT (TOTAL HIP), W/WO AUTOGRAFT/ALLOGRAFT;  Surgeon: Malka So, MD;  [facility];  Service: Orthopedics   ??? PR WRIST ARTHROSCOP,RELEASE XVERS LIG Left 03/21/2016    Procedure: LEFT ENDOSCOPIC CARPAL TUNNEL RELEASE;  Surgeon: Coralee Pesa, MD;  [facility]: [facility];  Service: Orthopedics       Social History     Socioeconomic History   ??? Marital status: Single     Spouse name: Not on file   ??? Number of children: Not on file   ??? Years of education: Not on file   ??? Highest education level: Not on file   Occupational History   ??? Not on file   Tobacco Use   ??? Smoking status: Current Every Day Smoker     Packs/day: 1.00     Years: 30.00     Pack years: 30.00     Types: Cigarettes     Start date: 05/19/1984   ??? Smokeless tobacco: Former Neurosurgeon     Types: Snuff   Substance and Sexual Activity   ??? Alcohol use: No     Alcohol/week: 0.0 standard drinks     Comment: recovering   ??? Drug use: No   ??? Sexual activity: Not on file   Other Topics Concern   ??? Exercise Not Asked   ??? Living Situation Not Asked   Social History Narrative   ??? Not on file     Social Determinants of Health     Financial Resource Strain:    ??? Difficulty of Paying Living Expenses:    Food Insecurity:    ??? Worried About Programme researcher, broadcasting/film/video in the Last Year:    ??? Barista in the Last Year:    Transportation Needs:    ??? Freight forwarder (Medical):    ??? Lack of Transportation (Non-Medical):    Physical Activity:    ??? Days of Exercise per Week:    ??? Minutes of Exercise per Session:    Stress:    ??? Feeling of Stress :    Social Connections:    ??? Frequency of Communication with Friends and Family:    ??? Frequency of Social Gatherings with Friends and Family:    ??? Attends Religious Services:    ??? Active Member of  Clubs or Organizations:    ??? Attends Banker Meetings:    ??? Marital Status:        There were no vitals filed for this visit.    PHYSICAL EXAMINATION:    GENERAL: Appears their stated age, no acute distress, normal mood and affect, alert and oriented.      REFLEXES:              KNEE: Right: 1+, Left 1+              ANKLE: Right:1+, Left 1+  SLR:  Right: negative, Left negative  VASCULAR: Weakly palpable DP pulse bilaterally  SENSATION:  Sensation slightly diminished to light touch in the sural/saphenous/deep peroneal/superficial peroneal distributions bilaterally  SKIN: No appreciable rashes, lesions, breakdown.  Prior posterior right THA incision well-healed without obvious complication     GAIT:   Mild antalgic gait favoring the left hip     HIP ROM:                 Right: flexion  90??, IR 10??, ER 45??, abduction 25??, adduction 5??, extension 0??              Left: flexion  95??, IR 10??, ER 50??, abduction 35??, adduction 10??, extension 0??     Trendelenburg : Negative  GT Bursal Tenderness: None     LEG LENGTHS: Left lower extremity 5 mm short      RADIOGRAPHS:   AP pelvis and left hip radiographs suggestive of mild AVN of the left hip with subchondral sclerosis without collapse of the femoral head.  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  Patient has left hip AVN with mild to moderate degenerative changes of his left hip.  His symptoms have been ongoing for multiple years and are beginning to affect his life significantly.  However, at this time the patient currently smokes about 30 cigarettes/day so is not a surgical candidate for a total hip arthroplasty nor do we feel his symptoms indicate THA.  We offered to refer him for an intra-articular ultrasound-guided hip injection but he declined at this time.  We will plan to see the patient back in 6 months with repeat x-rays of his left hip.  In the meantime, the patient will work on smoking cessation in hopes of potentially proceeding with total hip arthroplasty in the future.  All questions were answered.

## 2019-06-21 ENCOUNTER — Other Ambulatory Visit: Payer: Self-pay | Admitting: Family Medicine

## 2019-06-21 MED ORDER — PROAIR RESPICLICK 108 (90 BASE) MCG/ACT IN AEPB: 2.0000 | INHALATION_SPRAY | RESPIRATORY_TRACT | 0 refills | Status: DC | PRN

## 2019-06-21 NOTE — Progress Notes (Signed)
Requested by patient

## 2019-06-22 ENCOUNTER — Encounter
Payer: Medicaid Other | Attending: Physical Medicine and Rehabilitation | Admitting: Physical Medicine and Rehabilitation

## 2019-06-22 ENCOUNTER — Other Ambulatory Visit: Payer: Self-pay

## 2019-06-22 ENCOUNTER — Encounter: Payer: Self-pay | Admitting: Physical Medicine and Rehabilitation

## 2019-06-22 VITALS — BP 153/90 | HR 70 | Temp 97.7°F | Ht 69.0 in | Wt 253.0 lb

## 2019-06-22 DIAGNOSIS — M170 Bilateral primary osteoarthritis of knee: Secondary | ICD-10-CM | POA: Insufficient documentation

## 2019-06-22 DIAGNOSIS — M25562 Pain in left knee: Secondary | ICD-10-CM

## 2019-06-22 DIAGNOSIS — M542 Cervicalgia: Secondary | ICD-10-CM | POA: Insufficient documentation

## 2019-06-22 DIAGNOSIS — M25552 Pain in left hip: Secondary | ICD-10-CM | POA: Diagnosis present

## 2019-06-22 DIAGNOSIS — M25561 Pain in right knee: Secondary | ICD-10-CM | POA: Insufficient documentation

## 2019-06-22 DIAGNOSIS — M87052 Idiopathic aseptic necrosis of left femur: Secondary | ICD-10-CM

## 2019-06-22 DIAGNOSIS — M4802 Spinal stenosis, cervical region: Secondary | ICD-10-CM | POA: Diagnosis not present

## 2019-06-22 DIAGNOSIS — G8929 Other chronic pain: Secondary | ICD-10-CM | POA: Diagnosis present

## 2019-06-22 MED ORDER — AMITRIPTYLINE HCL 10 MG PO TABS: 10.0000 mg | ORAL_TABLET | Freq: Every day | ORAL | 3 refills | Status: DC

## 2019-06-22 NOTE — Progress Notes (Signed)
Subjective:    Patient ID: Blake Arnold, male    DOB: Jun 13, 1970, 49 y.o.   MRN: [id number]  HPI  Mr. Nemitz is a 49 year old man who presents for follow-up of chronic pain in multiple joints including his bilateral shoulders, knees, hips, and legs. He has been diagnosed with both rheumatoid arthritis and osteoarthritis. He is on methotrexate for his RA.    His worst pain is currently his right knee pain. His pain has been present for 30 years but the knee pain has been aggravating him recently. He has tried Mobic, Ibuprofen, Tylenol, Tramadol, Roxicodone, Vicodin. He has been on 4000mg  of Gabapentin without any benefit. I prescribed Lyrica and Cymbalta previously and he did not tolerate these well. 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 I repeated XR of his knees last visit, personally reviewed and discussed with patient, and they show no evidence of OA. Discussed the importance of weight loss last visit and he has managed to lose 4 lbs! He has also been looking for a job. He remains quite active and is on his feet most of the day. The worst pain currently is cervical pain that radiates into his arms occasionally, worse on the left side. He does not want any narcotic medication as he has a history of opioid addiction.    Pain Inventory Average Pain 6 Pain Right Now 6 My pain is sharp, burning, stabbing, tingling and aching  In the last 24 hours, has pain interfered with the following? General activity 0 Relation with others 0 Enjoyment of life 0 What TIME of day is your pain at its worst? morning Sleep (in general) Good  Pain is worse with: walking and some activites Pain improves with: nothing Relief from Meds: no pain meds  Mobility walk without assistance how many minutes can you walk? 20 ability to climb steps?  no do you drive?  yes Do you have any goals in this area?  yes  Function disabled: date disabled . I need assistance with the  following:  household duties Do you have any goals in this area?  yes  Neuro/Psych weakness tingling  Prior Studies Any changes since last visit?  no  Physicians involved in your care Any changes since last visit?  no   Family History  Problem Relation Age of Onset  . Heart attack Mother   . Atrial fibrillation Mother   . Skin cancer Father   . Colon polyps Neg Hx   . Esophageal cancer Neg Hx   . Stomach cancer Neg Hx   . Rectal cancer Neg Hx    Social History   Socioeconomic History  . Marital status: Single    Spouse name: Not on file  . Number of children: Not on file  . Years of education: Not on file  . Highest education level: Not on file  Occupational History  . Not on file  Tobacco Use  . Smoking status: Current Every Day Smoker    Packs/day: 2.00    Years: 34.00    Pack years: 68.00    Types: Cigarettes    Start date: 02/12/1984  . Smokeless tobacco: Former Systems developer    Quit date: 10/24/2012  . Tobacco comment: 1.5-2ppd  Quit attempt 12/24/2018  Substance and Sexual Activity  . Alcohol use: Yes    Comment: socially  . Drug use: Not Currently    Types: "Crack" cocaine, Cocaine    Comment: last use 07/26/13  . Sexual  activity: Never  Other Topics Concern  . Not on file  Social History Narrative  . Not on file   Social Determinants of Health   Financial Resource Strain:   . Difficulty of Paying Living Expenses:   Food Insecurity:   . Worried About Charity fundraiser in the Last Year:   . Arboriculturist in the Last Year:   Transportation Needs:   . Film/video editor (Medical):   [name] Lack of Transportation (Non-Medical):   Physical Activity:   . Days of Exercise per Week:   . Minutes of Exercise per Session:   Stress:   . Feeling of Stress :   Social Connections:   . Frequency of Communication with Friends and Family:   . Frequency of Social Gatherings with Friends and Family:   . Attends Religious Services:   . Active Member of Clubs or  Organizations:   . Attends Archivist Meetings:   [name] Marital Status:    Past Surgical History:  Procedure Laterality Date  . APPENDECTOMY    . CARDIOVERSION  03/07/2006  . CARPAL TUNNEL RELEASE    . CERVICAL FUSION    . LAPAROSCOPIC APPENDECTOMY N/A 07/23/2017   Procedure: APPENDECTOMY LAPAROSCOPIC;  Surgeon: Georganna Skeans, MD;  Location: [place];  Service: General;  Laterality: N/A;  . TOTAL HIP ARTHROPLASTY Right 2016   Past Medical History:  Diagnosis Date  . A-fib ([place])   . Acid reflux   . Alcohol abuse   . Anxiety   . Asthma   . AVN (avascular necrosis of bone) ([place])   . Chronic back pain   . COPD (chronic obstructive pulmonary disease) ([place])   . Depression   . Hepatitis C    history of  . History of urinary frequency   . Hypertension   . Peripheral neuropathy    hands and feet  . Polysubstance abuse ([place]) 01/25/2011  . 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[place])   . 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[place])   . Seizures (HCC)    BP (!) 153/90   Pulse 70   Temp 97.7 F (36.5 C)   Ht 5\' 9"  (1.753 m)   Wt 253 lb (114.8 kg)   SpO2 98%   BMI 37.36 kg/m   Opioid Risk Score:   Fall Risk Score:  `1  Depression screen PHQ 2/9  Depression screen [facility] 2/9 06/02/2019 04/05/2019 02/25/2019 01/18/2019 01/18/2019 09/01/2018  Decreased Interest 0 0 0 1 2 0  Down, Depressed, Hopeless 0 0 0 0 0 0  PHQ - 2 Score 0 0 0 1 2 0  Altered sleeping - - - 2 - -  Tired, decreased energy - - - 3 - -  Change in appetite - - - 1 - -  Feeling bad or failure about yourself  - - - 0 - -  Trouble concentrating - - - 2 - -  Moving slowly or fidgety/restless - - - 0 - -  Suicidal thoughts - - - 0 - -  PHQ-9 Score - - - 9 - -  Difficult doing work/chores - - - Very difficult - -    Review of Systems  Constitutional: Negative.   HENT: Negative.   Eyes: Negative.   Respiratory: Negative.   Cardiovascular: Negative.   Gastrointestinal: Negative.   Endocrine: Negative.   Musculoskeletal: Positive for  arthralgias, back pain and neck pain.  Skin: Negative.   Allergic/Immunologic: Negative.   Neurological: Positive for weakness.  Tingling   Hematological: Negative.   Psychiatric/Behavioral: Negative.   All other systems reviewed and are negative.      Objective:   Physical Exam Gen: no distress, normal appearing HEENT: oral mucosa pink and moist, NCAT Cardio: Reg rate Chest: normal effort, normal rate of breathing Abd: soft, non-distended Ext: no edema Skin: intact Neuro: AOx3.  Musculoskeletal: Normal ambulation. No tenderness to palpation of bilateral knees but there is pain upon rising from sit to stand. No effusions. Tenderness to palpation of cervical spine and lateral masses with bilateral range of motion restriction.  Psych: pleasant, normal affect     Assessment & Plan:  Mr. Akers is a 49 year old man who presents with chronic pain in multiple joints including his bilateral shoulders, knees, hips, and legs. He has been diagnosed with both rheumatoid arthritis and osteoarthritis. He is on methotrexate for his RA.   1)Bilateral knee pain R>L: --XRs personally reviewed and discussed with patient--show no evidence of OA.  --Continue Diclofenac gel. Can increase from twice per day to QID application.    2) Cervical spine pain: 3) Left hip AVN: --Failed gabapentin, lyrica, Cymbalta. Will Amitriptyline 10mg  HS for neuropathic pain related to cervical stenosis. Discussed cervical epidurals which he may consider in future; he would like to discuss with his spine surgeon first.    Morbid Obesity:  Explained that every pound of weight is 6 pounds on the knees. Furthermore, obesity results in inflammation which increases pain. He likes to eat carbs, red meats, nuts, and to drink cola. Advised to cut down on his consumption of cola and carbs which are less nutritive and more inflammatory. Will continue to monitor his weight and weight reduction will be a large part of  decreasing his pain. 257lbs-->254 lbs in 2 weeks; commended on this improvement!     All questions were encouraged and answered. Follow up with me in 1 month.

## 2019-07-15 ENCOUNTER — Ambulatory Visit (INDEPENDENT_AMBULATORY_CARE_PROVIDER_SITE_OTHER): Payer: Medicaid Other | Admitting: Family Medicine

## 2019-07-15 ENCOUNTER — Other Ambulatory Visit: Payer: Self-pay

## 2019-07-15 VITALS — BP 142/80 | HR 82 | Ht 69.0 in | Wt 252.6 lb

## 2019-07-15 DIAGNOSIS — I4811 Longstanding persistent atrial fibrillation: Secondary | ICD-10-CM | POA: Diagnosis present

## 2019-07-15 DIAGNOSIS — I10 Essential (primary) hypertension: Secondary | ICD-10-CM

## 2019-07-15 DIAGNOSIS — E785 Hyperlipidemia, unspecified: Secondary | ICD-10-CM

## 2019-07-15 DIAGNOSIS — F17200 Nicotine dependence, unspecified, uncomplicated: Secondary | ICD-10-CM

## 2019-07-15 DIAGNOSIS — Z720 Tobacco use: Secondary | ICD-10-CM

## 2019-07-15 MED ORDER — BUPROPION HCL ER (XL) 150 MG PO TB24: 150.0000 mg | ORAL_TABLET | Freq: Every day | ORAL | 1 refills | Status: DC

## 2019-07-15 MED ORDER — SIMVASTATIN 40 MG PO TABS: 40.0000 mg | ORAL_TABLET | Freq: Every day | ORAL | 0 refills | Status: DC

## 2019-07-15 MED ORDER — ASPIRIN EC 81 MG PO TBEC: 81.0000 mg | DELAYED_RELEASE_TABLET | Freq: Every day | ORAL | 0 refills | Status: DC

## 2019-07-15 MED ORDER — METOPROLOL TARTRATE 50 MG PO TABS: 50.0000 mg | ORAL_TABLET | Freq: Two times a day (BID) | ORAL | 0 refills | Status: DC

## 2019-07-15 MED ORDER — AMLODIPINE BESYLATE 5 MG PO TABS: 5.0000 mg | ORAL_TABLET | Freq: Every day | ORAL | 2 refills | Status: DC

## 2019-07-15 MED ORDER — PREGABALIN 25 MG PO CAPS: 25.0000 mg | ORAL_CAPSULE | Freq: Every day | ORAL | 0 refills | Status: DC

## 2019-07-15 MED ORDER — HYDROCHLOROTHIAZIDE 25 MG PO TABS: 25.0000 mg | ORAL_TABLET | Freq: Every day | ORAL | 2 refills | Status: DC

## 2019-07-15 NOTE — Assessment & Plan Note (Signed)
Last lipid panel in 01/2019, repeat in 01/2020 On simvastatin, refilled today

## 2019-07-15 NOTE — Assessment & Plan Note (Signed)
Above goal slightly today at [id number] systolic. Most likely due to smoking a cigarette right before his appointment. He states otherwise when he checks at home it is well controlled. - cont Amlodipine 5mg  daily, would increase to 10mg  daily if still elevated at next visit - cont HCTZ 25mg  daily, repeat BMP at next visit in 6 months - Cont Metoprolol 50mg  BID daily

## 2019-07-15 NOTE — Assessment & Plan Note (Signed)
Refilled wellbutrin and counseling provided. He has met with Dr. Valentina Lucks here in our clinic. Encouraged patient to continue to try and quit. Meets criteria for low dose CT chest for lung cancer screening. I have placed order and patient wanted to call and schedule himself due to not being sure about his work schedule. I gave patient the number and contact info for [facility].

## 2019-07-15 NOTE — Addendum Note (Signed)
Addended by: Ezzard Flax on: 07/15/2019 11:43 AM   Modules accepted: Orders

## 2019-07-15 NOTE — Patient Instructions (Signed)
It was great to see you today! Thank you for letting me participate in your care!  Today, we discussed your high blood pressure. Please continue taking your medications as prescribed. If your blood pressure continues to be elevated at the next check we will need to increase it.  Please let me know if we can aid you further in your attempts to quit smoking. I have refilled your Wellbutrin which should help with cravings. Stopping smoking is the best change you can make to improve your health.  Be well, Harolyn Rutherford, DO PGY-3, [facility]

## 2019-07-15 NOTE — Progress Notes (Addendum)
SUBJECTIVE:   CHIEF COMPLAINT / HPI:   HTN Patient on amlodipine and HCTZ for blood pressure control. Denies headaches, vision changes, chest pain, or shortness of breath. He has been more active lately with a job. His BP is slightly high today and he states he just finished smoking a cigarette before he came to clinic. Also on Metoprolol that will help some with blood pressure reduction but he is on this more for rate control due to A fib.  HLD Taking Simvastatin without myalgias. Has lost weight recently which should help with LDL levels. Last check in December. Recheck in 6 months 01/2020.  A Fib Longstanding, intermittent. Not presently in A Fib today. He states last time he felt any palpitation has been several months. Has had no issues such as syncope or near syncope with medication. No events where he felt like his heart was beating too slow. He tolerates Metoprolol well and gets additional benefit for BP control. On ASA 81mg as CHADS-VASC score is 1.   Tobacco Cessation 20 plus pack year history, currently still smoking cigarretes less than a pack per day. He is 49 years old with a 20 pack year history and current smoker. He expresses a desire to quit but is struggling to do so. Counseling provided. I will refill his Wellbutrin.  PERTINENT  PMH / PSH: HTN, HLD, Tobacco Abuse, Hx of A Fib  OBJECTIVE:   BP (!) 142/80   Pulse 82   Ht 5' 9" (1.753 m)   Wt 252 lb 9.6 oz (114.6 kg)   SpO2 97%   BMI 37.30 kg/m   Gen: NAD Cardiac: RRR, no murmurs Resp: CTAB Ext: +2 pulses bilaterally in UE  ASSESSMENT/PLAN:   HTN (hypertension) Above goal slightly today at 142 systolic. Most likely due to smoking a cigarette right before his appointment. He states otherwise when he checks at home it is well controlled. - cont Amlodipine 5mg daily, would increase to 10mg daily if still elevated at next visit - cont HCTZ 25mg daily, repeat BMP at next visit in 6 months - Cont Metoprolol 50mg  BID daily  FIBRILLATION, ATRIAL Chronic, intermittent. He states he still gets brief episodes where he feels his heart is fluttering and going real fast but no syncope or near syncope. These episodes self resolve within minutes. Rate controlled in clinic today. - Cont Metroprolol 50mg BID daily  Hyperlipidemia Last lipid panel in 01/2019, repeat in 01/2020 On simvastatin, refilled today  TOBACCO ABUSE Refilled wellbutrin and counseling provided. He has met with Dr. Koval here in our clinic. Encouraged patient to continue to try and quit. Meets criteria for low dose CT chest for lung cancer screening. I have placed order and patient wanted to call and schedule himself due to not being sure about his work schedule. I gave patient the number and contact info for [facility].      , DO  [facility]   

## 2019-07-15 NOTE — Assessment & Plan Note (Signed)
Chronic, intermittent. He states he still gets brief episodes where he feels his heart is fluttering and going real fast but no syncope or near syncope. These episodes self resolve within minutes. Rate controlled in clinic today. - Cont Metroprolol 50mg  BID daily

## 2019-07-20 ENCOUNTER — Encounter
Payer: Medicaid Other | Attending: Physical Medicine and Rehabilitation | Admitting: Physical Medicine and Rehabilitation

## 2019-07-20 ENCOUNTER — Encounter: Payer: Self-pay | Admitting: Physical Medicine and Rehabilitation

## 2019-07-20 ENCOUNTER — Other Ambulatory Visit: Payer: Self-pay

## 2019-07-20 VITALS — BP 132/84 | HR 75 | Temp 98.7°F | Ht 69.0 in | Wt 247.8 lb

## 2019-07-20 DIAGNOSIS — M222X1 Patellofemoral disorders, right knee: Secondary | ICD-10-CM

## 2019-07-20 DIAGNOSIS — M25552 Pain in left hip: Secondary | ICD-10-CM | POA: Insufficient documentation

## 2019-07-20 DIAGNOSIS — M87052 Idiopathic aseptic necrosis of left femur: Secondary | ICD-10-CM

## 2019-07-20 DIAGNOSIS — G8929 Other chronic pain: Secondary | ICD-10-CM | POA: Insufficient documentation

## 2019-07-20 DIAGNOSIS — M4802 Spinal stenosis, cervical region: Secondary | ICD-10-CM

## 2019-07-20 DIAGNOSIS — M25561 Pain in right knee: Secondary | ICD-10-CM | POA: Diagnosis present

## 2019-07-20 DIAGNOSIS — M222X2 Patellofemoral disorders, left knee: Secondary | ICD-10-CM | POA: Diagnosis not present

## 2019-07-20 DIAGNOSIS — M170 Bilateral primary osteoarthritis of knee: Secondary | ICD-10-CM | POA: Insufficient documentation

## 2019-07-20 DIAGNOSIS — M542 Cervicalgia: Secondary | ICD-10-CM | POA: Diagnosis present

## 2019-07-20 DIAGNOSIS — M25562 Pain in left knee: Secondary | ICD-10-CM | POA: Diagnosis present

## 2019-07-20 MED ORDER — MELOXICAM 15 MG PO TABS: 15.0000 mg | ORAL_TABLET | Freq: Every day | ORAL | 0 refills | Status: DC

## 2019-07-20 NOTE — Progress Notes (Signed)
Subjective:    Patient ID: Blake Arnold, male    DOB: 1971-01-14, 49 y.o.   MRN: [id number]  HPI  Blake Arnold is a 49 year old man who presents for follow-up of chronic pain in multiple joints including his bilateral shoulders, knees, hips, and legs. He has been diagnosed with both rheumatoid arthritis and osteoarthritis. He is on methotrexate for his RA but has not taken this in the past 2 months.   His worst pain is currently his right knee pain. His pain has been present for 30 years but the knee pain has been aggravating him recently. He has tried Mobic, Ibuprofen, Tylenol, Tramadol, Roxicodone, Vicodin. He has been on 4000mg  of Gabapentin without any benefit. I prescribed Lyrica, Cymbalta, and Amitriptyline previously and he did not tolerate these well. 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 I repeated XR of his knees previsouly, personally reviewed and discussed with patient, and they show no evidence of OA. Knee symptoms are worse when ascending and descending stairs and after sitting for prolonged periods. Discussed the importance of weight loss last visit and he has managed to lose 10 lbs in the past month! He started working but this has exacerbated his pain and he does not feel that he can continue it. He remains quite active and is on his feet most of the day. The worst pain currently is cervical pain that radiates into his arms occasionally, worse on the left side. He does not want any narcotic medication as he has a history of opioid addiction.   Pain Inventory Average Pain 6 Pain Right Now 8 My pain is sharp, burning, stabbing, tingling and aching  In the last 24 hours, has pain interfered with the following? General activity 9 Relation with others 8 Enjoyment of life 9 What TIME of day is your pain at its worst? all Sleep (in general) Fair  Pain is worse with: walking, bending, sitting and standing Pain improves with: nothing Relief from  Meds: na  Mobility walk without assistance how many minutes can you walk? 20 ability to climb steps?  yes do you drive?  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  no  Physicians involved in your care Any changes since last visit?  no   Family History  Problem Relation Age of Onset  . Heart attack Mother   . Atrial fibrillation Mother   . Skin cancer Father   . Colon polyps Neg Hx   . Esophageal cancer Neg Hx   . Stomach cancer Neg Hx   . Rectal cancer Neg Hx    Social History   Socioeconomic History  . Marital status: Single    Spouse name: Not on file  . Number of children: Not on file  . Years of education: Not on file  . Highest education level: Not on file  Occupational History  . Not on file  Tobacco Use  . Smoking status: Current Every Day Smoker    Packs/day: 2.00    Years: 34.00    Pack years: 68.00    Types: Cigarettes    Start date: 02/12/1984  . Smokeless tobacco: Former Systems developer    Quit date: 10/24/2012  . Tobacco comment: 1.5-2ppd  Quit attempt 12/24/2018  Substance and Sexual Activity  . Alcohol use: Yes    Comment: socially  . Drug use: Not Currently  Types: "Crack" cocaine, Cocaine    Comment: last use 07/26/13  . Sexual activity: Never  Other Topics Concern  . Not on file  Social History Narrative  . Not on file   Social Determinants of Health   Financial Resource Strain:   . Difficulty of Paying Living Expenses:   Food Insecurity:   . Worried About Charity fundraiser in the Last Year:   . Arboriculturist in the Last Year:   Transportation Needs:   . Film/video editor (Medical):   [name] Lack of Transportation (Non-Medical):   Physical Activity:   . Days of Exercise per Week:   . Minutes of Exercise per Session:   Stress:   . Feeling of Stress :   Social Connections:   . Frequency of Communication with Friends and Family:     . Frequency of Social Gatherings with Friends and Family:   . Attends Religious Services:   . Active Member of Clubs or Organizations:   . Attends Archivist Meetings:   [name] Marital Status:    Past Surgical History:  Procedure Laterality Date  . APPENDECTOMY    . CARDIOVERSION  03/07/2006  . CARPAL TUNNEL RELEASE    . CERVICAL FUSION    . LAPAROSCOPIC APPENDECTOMY N/A 07/23/2017   Procedure: APPENDECTOMY LAPAROSCOPIC;  Surgeon: Georganna Skeans, MD;  Location: [facility];  Service: General;  Laterality: N/A;  . TOTAL HIP ARTHROPLASTY Right 2016   Past Medical History:  Diagnosis Date  . A-fib ([place])   . Acid reflux   . Alcohol abuse   . Anxiety   . Asthma   . AVN (avascular necrosis of bone) ([place])   . Chronic back pain   . COPD (chronic obstructive pulmonary disease) ([place])   . Depression   . Hepatitis C    history of  . History of urinary frequency   . Hypertension   . Peripheral neuropathy    hands and feet  . Polysubstance abuse ([place]) 01/25/2011  . 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[place])   . 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[place])   . Seizures ([facility])    BP 132/84   Pulse 75   Temp 98.7 F (37.1 C)   Ht 5\' 9"  (1.753 m)   Wt 247 lb 12.8 oz (112.4 kg)   SpO2 95%   BMI 36.59 kg/m   Opioid Risk Score:   Fall Risk Score:  `1  Depression screen PHQ 2/9  Depression screen [facility] 2/9 07/15/2019 06/02/2019 04/05/2019 02/25/2019 01/18/2019 01/18/2019 09/01/2018  Decreased Interest 0 0 0 0 1 2 0  Down, Depressed, Hopeless 0 0 0 0 0 0 0  PHQ - 2 Score 0 0 0 0 1 2 0  Altered sleeping - - - - 2 - -  Tired, decreased energy - - - - 3 - -  Change in appetite - - - - 1 - -  Feeling bad or failure about yourself  - - - - 0 - -  Trouble concentrating - - - - 2 - -  Moving slowly or fidgety/restless - - - - 0 - -  Suicidal thoughts - - - - 0 - -  PHQ-9 Score - - - - 9 - -  Difficult doing work/chores - - - - Very difficult - -    Review of Systems  Neurological: Positive for weakness.       Tingling   All other systems reviewed and are negative.      Objective:  Physical Exam Gen: no distress, normal appearing HEENT: oral mucosa pink and moist, NCAT Cardio: Reg rate Chest: normal effort, normal rate of breathing Abd: soft, non-distended Ext: no edema Skin: intact Neuro: AOx3.  Musculoskeletal: Normal ambulation. +pain in knees with sit to stand. +patellar grind. No effusions. Tenderness to palpation of cervical spine and lateral masses with bilateral range of motion restriction.  Psych: pleasant, normal affect     Assessment & Plan:  Blake Arnold is a 49 year old man who presents with chronic pain in multiple joints including his bilateral shoulders, knees, hips, and legs. He has been diagnosed with both rheumatoid arthritis and osteoarthritis. He is on methotrexate for his RA which he has not taken for the past 2 months.    1)Bilateral knee pain R>L: --XRs personally reviewed and discussed with patient--show no evidence of OA.  --Continue Diclofenac gel. Can increase from twice per day to QID application. This has been helping.    2) Cervical spine pain: 3) Left hip AVN: --Failed gabapentin, lyrica, Cymbalta, Amitriptyline. Discussed cervical epidurals which he may consider in future; he would like to discuss with his spine surgeon first. --Mobic 15 mg daily for pain relief, take daily for 14 days. Stop if you feel abdominal pain as this medication can cause gastric ulcers, usually if taken for a long time.    Morbid Obesity:  Explained that every pound of weight is 6 pounds on the knees. He remembered this fact. Furthermore, obesity results in inflammation which increases pain. He likes to eat carbs, red meats, nuts, and to drink cola. Advised to cut down on his consumption of cola (he has done!) and carbs which are less nutritive and more inflammatory. Will continue to monitor his weight and weight reduction will be a large part of decreasing his pain. 257lbs-->247 lbs in 4  weeks; commended on this improvement!     All questions were encouraged and answered. Follow up with me in 1 month.

## 2019-08-02 ENCOUNTER — Other Ambulatory Visit: Payer: Self-pay | Admitting: Family Medicine

## 2019-08-03 ENCOUNTER — Ambulatory Visit: Payer: Medicaid Other

## 2019-08-09 DIAGNOSIS — M059 Rheumatoid arthritis with rheumatoid factor, unspecified: Principal | ICD-10-CM

## 2019-08-09 DIAGNOSIS — Z79899 Other long term (current) drug therapy: Principal | ICD-10-CM

## 2019-08-18 ENCOUNTER — Encounter: Payer: Medicaid Other | Admitting: Physical Medicine and Rehabilitation

## 2019-08-20 ENCOUNTER — Other Ambulatory Visit: Payer: Self-pay

## 2019-08-20 ENCOUNTER — Ambulatory Visit
Admission: RE | Admit: 2019-08-20 | Discharge: 2019-08-20 | Disposition: A | Discharge status: Home / Self Care | Payer: Medicaid Other | Source: Ambulatory Visit | Attending: Family Medicine | Admitting: Family Medicine

## 2019-08-20 DIAGNOSIS — Z720 Tobacco use: Secondary | ICD-10-CM

## 2019-08-22 NOTE — Unmapped (Unsigned)
RHEUMATOLOGY CLINIC FOLLOW-UP NOTE      Assessment/Plan:      Seropositive nonerosive RA  Briefly: Diagnosed 2017. Previously on MTX/HCQ/adalimumab now down-titrated to MTX/adalimumab. Since last visit ***  -- Labs: MTX monitoring labs ***?>?  -- Medications: Cont adalimumab weekly, MTX 25mg  subcutaneous weekly; folic acid 1mg  daily purchased OTC.   -- Imaging: Reviewed per HPI  -- Avoiding prednisone with hx AVN  ??  Abdominal pain//hematozhecia  ??  High risk medication use  -- Vaccinations: UTD PNA13/PNA23; covid***    RTC in No follow-ups on file.    This patient was seen and discussed with Dr. Ellamae Sia who agrees with the plan outlined above.    Subjective:   Primary Care Provider: [facility]    HPI:  Joseph Sandoval is a 49 y.o.  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 Last seen in clinic 03/2019 at which time he was continued on MTX/adlimumab with stable symptoms. He was encouraged to follow-up with labs.     Medical updates:  -- Followed up with ortho for L hip AVN without indication for THA. Deferred steroid injection.   -- Abdominal pain: followed up with outside GI for 1 month history of abdominal pain, diarrhea, intermittent hematochezia. Evaluation included ***.     Since last visit he is feeling ***.    MSK:  -- 3rd digits and tools. CTS.       Further ROS as below.    Disease History:  --??2017 diagnosed with seropositive RA when he presented with bilateral small joint inflammatory arthritis. Labs notable for initially +RF with follow-up testing negative, +CCP 18. Hand imaging without erosions. Bilateral hip imaging with osteonecrosis for which he underwent R hip arthroplasty in 2017. Initiated MTX.  -- 2018 concern for uncontrolled sx prompting addition of HCQ. Subsequently escalated to adalimumab.  Underwent L carpal tunnel release.  --??2019 increased adalimumab to qweekly. Discontinued HCQ given no benefit. Continued MTX. 2019 concern for poor disease control although he had missed several doses of IS in the setting of infections [appendicitis, diverticulitis]. Hx C5-C7 ACDF.  -- 2020 concern for screw loosing/broken, pseudoarthrosis C6-C7 &??myelopathy symptoms. Evaluated by Ortho with potential indication for??surgery.   ??  Labs:  -- 2016-2017: RF 18 then normalized; CCP 18  ??  Imaging:  -- 2020: CT C-spine: Prior C5-C7 ACDF with fracture of the left C7 screw with minimal backing out of the screw head. No ankylosis evident at C6-C7. Unchanged spondylosis compared to MRI from 08/22/2017.  -- 2020: Xray L hip: Mild sclerosis of the left femoral head is consistent with osteonecrosis. No evidence for subchondral fracture or articular surface collapse.  -- 2020: Xray X-spine flexion: ACDF C5-C7. Mild retraction and fracture of one of the C7 screws, unchanged from prior. Hardware is otherwise intact. Straightening of cervical lordosis without static listhesis. No abnormal motion flexion or extension to suggest dynamic instability.  -- 2019: Xray hands: No radiographic evidence of inflammatory or erosive arthropathy involving the hands.    Review of Systems    PMH, PFH, PSH as previously documented.  Medications were reviewed this visit.    Objective:   There were no vitals filed for this visit.  There is no height or weight on file to calculate BMI.    Physical Exam  -- GENERAL: sitting up; no acute distress; appears comfortable  -- HEAD: atraumatic  --  EYES: normal sclera; conjunctivae are clear  -- ENT: MMM; no mucosal or nasal lesions  -- NECK: supple; no palpable lymphadenopathy  -- CARDIO: RRR; no g/r/m  -- PULM: clear to auscultation bilaterally without crackles, wheezing, or coarse breath sounds  -- GI: soft, nontender  -- SKIN: no diffuse rashes  -- NEURO: alert; oriented; moving all extremities    MSK:      ROM: full ROM bilateral shoulders, elbows, wrists, UE digits, knees, ankles, LE digits      No TTP, swelling, or erythema of bilateral shoulders, elbows, wrists, UE digits, knees, ankles, LE digits

## 2019-08-23 MED ORDER — METHOTREXATE SODIUM 25 MG/ML INJECTION SOLUTION
SUBCUTANEOUS | 11 refills | 28 days
Start: 2019-08-23 — End: 2020-08-22

## 2019-08-23 MED ORDER — BD TUBERCULIN SYRINGE 1 ML 25 GAUGE X 5/8"
PRN refills | 0.00000 days
Start: 2019-08-23 — End: ?

## 2019-08-23 NOTE — Unmapped (Signed)
Mr Joseph Sandoval has 4 doses of Humira and Methotrexate.  He admits to missing doses until he was fully vaccinated.  He has since restarted and denies missing any additional doses.  He requests a call back in 3 weeks to set up delivery.      Luciano Cinquemani Vangie Bicker, PharmD

## 2019-08-24 ENCOUNTER — Other Ambulatory Visit: Payer: Self-pay

## 2019-08-24 ENCOUNTER — Ambulatory Visit (INDEPENDENT_AMBULATORY_CARE_PROVIDER_SITE_OTHER): Payer: Medicaid Other | Admitting: Family Medicine

## 2019-08-24 VITALS — BP 126/76 | HR 72 | Ht 69.0 in | Wt 247.0 lb

## 2019-08-24 DIAGNOSIS — B351 Tinea unguium: Secondary | ICD-10-CM

## 2019-08-24 DIAGNOSIS — G5602 Carpal tunnel syndrome, left upper limb: Secondary | ICD-10-CM | POA: Diagnosis not present

## 2019-08-24 DIAGNOSIS — G5600 Carpal tunnel syndrome, unspecified upper limb: Secondary | ICD-10-CM | POA: Insufficient documentation

## 2019-08-24 MED ORDER — TERBINAFINE HCL 250 MG PO TABS: 250.0000 mg | ORAL_TABLET | Freq: Every day | ORAL | 0 refills | Status: DC

## 2019-08-24 MED ORDER — METHYLPREDNISOLONE ACETATE 40 MG/ML IJ SUSP
40.0000 mg | Freq: Once | INTRAMUSCULAR | Status: AC
  Administered 2019-08-24: 40 mg via INTRAMUSCULAR

## 2019-08-24 NOTE — Assessment & Plan Note (Signed)
History and exam consistent with carpal tunnel syndrome of the left hand.  Very low suspicion for cervical neuropathy leading to present symptoms.  We discussed steroid injections in addition to wrist splinting and NSAIDs.  If these preliminary options do not produce significant benefit then it may be necessary for a subsequent surgical release. -Steroid injection performed today to the carpal tunnel of the left hand -He was encouraged to continue using wrist splints for the next 4 weeks -Continue using Mobic as needed

## 2019-08-24 NOTE — Assessment & Plan Note (Signed)
We discussed treatment versus no treatment.  Blake Arnold is interested in treatment.  He does have a history of treated hepatitis C and liver injury. -Baseline CMP today -Start terbinafine daily for 12 weeks -Follow-up in 6 weeks for repeat CMP

## 2019-08-24 NOTE — Patient Instructions (Addendum)
Carpal tunnel syndrome: For now, continue wearing your splints at night and taking your Mobic daily.  We were able to get that injection done in clinic and so he should be experiencing some relief immediately although the steroids usually take a day or 2 to really kick in with her anti-inflammatory effect.  If this is not significantly helpful in the coming weeks, we can consider a tendon release again even though you have had it before.  Toenail infection: We will get a take a look at your liver today before starting treatment.  You can start taking your antifungal treatment today.  I will let you know if there are any issues with your blood work.  It looks like you do have some mild liver impairment at baseline this lets make sure we do blood work again in 6 weeks to make sure this medication is not aggravating her liver.

## 2019-08-24 NOTE — Progress Notes (Signed)
    SUBJECTIVE:   CHIEF COMPLAINT / HPI:   Carpal tunnel syndrome Blake Arnold reports that he has been having increasing numbness in his left hand and particularly irritating shooting pain to his left middle finger.  He notices this most at night.  He has had previous issues with carpal tunnel bilaterally and is previously been given wrist splints, NSAIDs and had carpal tunnel releases bilaterally.  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  He did notice significant improvement following his neck surgery.  This left hand numbness is the 1st neurological issue that he has had since his neck surgery.  He suspected carpal tunnel syndrome and begin wearing his wrist splints at night several days ago.  Onychomycosis He reports that he has had a yellowed toenail on his 1st toes bilaterally for months that has been resistant to any of his treatment at home.  He reports that he does occasionally get athlete's foot at home which he treats with over-the-counter Lotrimin.  He is interested in therapy for these yellow toenails.  PERTINENT  PMH / PSH: Previous carpal tunnel syndrome bilaterally requiring surgical release bilaterally  OBJECTIVE:   BP 126/76   Pulse 72   Ht 5\' 9"  (1.753 m)   Wt 247 lb (112 kg)   SpO2 97%   BMI 36.48 kg/m    General: Well-appearing middle-aged man seated comfortably in the exam room. Neck: No reproduction of hand pain with axial pressure during neck extension and rotation. Respiratory: Breathing comfortably on room air.  No respiratory distress Skin: Warm, dry.  Yellowed 1st toe toenails with thickened nail. Wrist: Inspection no obvious deformity or abnormality.  Scars present on wrists bilaterally from previous surgeries. Palpation: No significant tenderness to palpation. Neurovascularly intact Full active ROM of wrists and digits Special tests: Left hand numbness noted in fingers 2 through 5  with Phalen's test.  Positive shooting and irritation of the middle finger with Tinel's test.  Injection: An injection of 40 mg Depo-Medrol with 1 cc of lidocaine was performed in the left carpal tunnel.  No complications.  Band-Aid placed over injection site.  ASSESSMENT/PLAN:   Carpal tunnel syndrome History and exam consistent with carpal tunnel syndrome of the left hand.  Very low suspicion for cervical neuropathy leading to present symptoms.  We discussed steroid injections in addition to wrist splinting and NSAIDs.  If these preliminary options do not produce significant benefit then it may be necessary for a subsequent surgical release. -Steroid injection performed today to the carpal tunnel of the left hand -He was encouraged to continue using wrist splints for the next 4 weeks -Continue using Mobic as needed  Onychomycosis We discussed treatment versus no treatment.  Blake Arnold is interested in treatment.  He does have a history of treated hepatitis C and liver injury. -Baseline CMP today -Start terbinafine daily for 12 weeks -Follow-up in 6 weeks for repeat CMP     Matilde Haymaker, MD [place]

## 2019-08-25 LAB — TSH: TSH: 1.54 u[IU]/mL (ref 0.450–4.500)

## 2019-08-25 NOTE — Unmapped (Signed)
appt

## 2019-08-27 LAB — COMPREHENSIVE METABOLIC PANEL
ALT: 73 IU/L — ABNORMAL HIGH (ref 0–44)
AST: 38 IU/L (ref 0–40)
Albumin/Globulin Ratio: 1.3 (ref 1.2–2.2)
Albumin: 4.4 g/dL (ref 4.0–5.0)
Alkaline Phosphatase: 74 IU/L (ref 48–121)
BUN/Creatinine Ratio: 12 (ref 9–20)
BUN: 11 mg/dL (ref 6–24)
Bilirubin Total: 0.4 mg/dL (ref 0.0–1.2)
CO2: 21 mmol/L (ref 20–29)
Calcium: 9.9 mg/dL (ref 8.7–10.2)
Chloride: 99 mmol/L (ref 96–106)
Creatinine, Ser: 0.91 mg/dL (ref 0.76–1.27)
GFR calc Af Amer: 113 mL/min/{1.73_m2} (ref >59)
GFR calc non Af Amer: 98 mL/min/{1.73_m2} (ref >59)
Globulin, Total: 3.3 g/dL (ref 1.5–4.5)
Glucose: 92 mg/dL (ref 65–99)
Potassium: 4.4 mmol/L (ref 3.5–5.2)
Sodium: 139 mmol/L (ref 134–144)
Total Protein: 7.7 g/dL (ref 6.0–8.5)

## 2019-08-27 LAB — SPECIMEN STATUS REPORT

## 2019-08-31 ENCOUNTER — Other Ambulatory Visit: Payer: Self-pay | Admitting: Family Medicine

## 2019-09-07 ENCOUNTER — Telehealth: Payer: Self-pay

## 2019-09-07 NOTE — Telephone Encounter (Signed)
Patient calls nurse line reporting allergic reaction to Terbinafine. Patient reports he started medication ~ 1 week ago. Patient reports he noticed "some spots under my lip," but didn't think anything about them because they did not hurt. Later he was told by his niece he had a full blown rash on back while he was doing yard work. Patient reports his back does itch and he has been using Benadryl to help. Patient has stopped medication and is requesting an alternative. Please advise.

## 2019-09-08 ENCOUNTER — Other Ambulatory Visit: Payer: Self-pay

## 2019-09-08 ENCOUNTER — Encounter: Payer: Self-pay | Admitting: Family Medicine

## 2019-09-08 ENCOUNTER — Ambulatory Visit (INDEPENDENT_AMBULATORY_CARE_PROVIDER_SITE_OTHER): Payer: Medicaid Other | Admitting: Family Medicine

## 2019-09-08 VITALS — BP 122/80 | HR 75 | Ht 69.0 in | Wt 245.8 lb

## 2019-09-08 DIAGNOSIS — L270 Generalized skin eruption due to drugs and medicaments taken internally: Secondary | ICD-10-CM | POA: Diagnosis present

## 2019-09-08 DIAGNOSIS — B351 Tinea unguium: Secondary | ICD-10-CM

## 2019-09-08 DIAGNOSIS — R21 Rash and other nonspecific skin eruption: Secondary | ICD-10-CM | POA: Diagnosis not present

## 2019-09-08 DIAGNOSIS — G5602 Carpal tunnel syndrome, left upper limb: Secondary | ICD-10-CM | POA: Diagnosis not present

## 2019-09-08 MED ORDER — TRIAMCINOLONE ACETONIDE 0.5 % EX OINT: 1.0000 "application " | TOPICAL_OINTMENT | Freq: Two times a day (BID) | CUTANEOUS | 0 refills | Status: DC

## 2019-09-08 NOTE — Patient Instructions (Addendum)
It was good to see you today.  Thank you for coming in.  We would like you to stop taking Terbinafine and apply Triamcinolone cream daily after showering for a week.  After that you may use as needed.     We are also obtaining labs today to rule out systemic casues of rash.  I will call you if any come back abnormal.  If they are normal I will send you a message through [place].  You should be better in 1 week If you are not better by then or if you have worsening rash, fever or significant muscle or joint pain.  Come back and see [place].  We put in a referral to Podiatry for your Onchomycoses.  We are putting in a referral to Hand Surgery for your Carple Tunnel.   Be Well, Dr Kathrin Ruddy

## 2019-09-08 NOTE — Telephone Encounter (Signed)
Called and advised [name] that he can either come in for an appointment to discuss the rash and consider continuation of the medicine vs. Other medication or he can forego treatment altogether.  He elected to make and appointment to be seen in the next couple days.  Blake Haymaker, MD

## 2019-09-08 NOTE — Unmapped (Signed)
I was able to reach patient for follow-up as he missed last appointment. He has recovered after a few week history of laryngitis treated by local doctors.    He is currently in the process of trying to transfer his care to [facility] including rheumatology care although he isn't sure what the wait time will be before he can be seen. He is being evaluated by PCP today for hand paresthesias which he feels like is similar to carpal tunnel syndrome. He will be asking for a referral for local rheumatology as well.    In the meantime he is open to traveling to [facility] for an in-person evaluation and to help facilitate continuity of care while he waits for local rheumatology availability.     I messaged to schedule for September 17th at 10:30AM for an in-person visit.

## 2019-09-09 LAB — COMPREHENSIVE METABOLIC PANEL
ALT: 55 IU/L — ABNORMAL HIGH (ref 0–44)
AST: 28 IU/L (ref 0–40)
Albumin/Globulin Ratio: 1.4 (ref 1.2–2.2)
Albumin: 4.5 g/dL (ref 4.0–5.0)
Alkaline Phosphatase: 65 IU/L (ref 48–121)
BUN/Creatinine Ratio: 9 (ref 9–20)
BUN: 9 mg/dL (ref 6–24)
Bilirubin Total: 0.7 mg/dL (ref 0.0–1.2)
CO2: 22 mmol/L (ref 20–29)
Calcium: 9.3 mg/dL (ref 8.7–10.2)
Chloride: 100 mmol/L (ref 96–106)
Creatinine, Ser: 1 mg/dL (ref 0.76–1.27)
GFR calc Af Amer: 101 mL/min/{1.73_m2} (ref >59)
GFR calc non Af Amer: 87 mL/min/{1.73_m2} (ref >59)
Globulin, Total: 3.2 g/dL (ref 1.5–4.5)
Glucose: 91 mg/dL (ref 65–99)
Potassium: 4.5 mmol/L (ref 3.5–5.2)
Sodium: 140 mmol/L (ref 134–144)
Total Protein: 7.7 g/dL (ref 6.0–8.5)

## 2019-09-09 LAB — CBC
Hematocrit: 47.3 % (ref 37.5–51.0)
Hemoglobin: 16.5 g/dL (ref 13.0–17.7)
MCH: 33.7 pg — ABNORMAL HIGH (ref 26.6–33.0)
MCHC: 34.9 g/dL (ref 31.5–35.7)
MCV: 97 fL (ref 79–97)
Platelets: 257 10*3/uL (ref 150–450)
RBC: 4.9 x10E6/uL (ref 4.14–5.80)
RDW: 11.8 % (ref 11.6–15.4)
WBC: 9 10*3/uL (ref 3.4–10.8)

## 2019-09-09 LAB — RPR: RPR Ser Ql: NONREACTIVE

## 2019-09-09 LAB — HIV ANTIBODY (ROUTINE TESTING W REFLEX): HIV Screen 4th Generation wRfx: NONREACTIVE

## 2019-09-11 DIAGNOSIS — R21 Rash and other nonspecific skin eruption: Secondary | ICD-10-CM | POA: Insufficient documentation

## 2019-09-11 NOTE — Assessment & Plan Note (Signed)
-   Will stop Terbinafine treatment and refer to Podiatry for further treatment

## 2019-09-11 NOTE — Progress Notes (Deleted)
    SUBJECTIVE:   CHIEF COMPLAINT / HPI: Rash  Blake Arnold complaining of rash.  Indicates ths started 3 days ago when his wife first noticed 3 days ago on his back.  Also had small spot onl ip which has since dissapeared Indicates becomes itchy when he is out in sun.  Also has some underlying muscle pain in his triceps where rash was located.  Rash has not expresed fluid.  Patient has Rheumatoid Arthritis but denies any new joint pain.  Denies any fevers cough or sick contacts.  Has never tested positive for herpes.  Had Hepatits C whic hwas effectively treated.  Drinks 1-3 beers a day and smokes a pack a day.  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  Likes spending time outside and does yardwork frequently. Recently started on Terbinafine for Onchomycoses, patient believes this may be causing rash.   Patient also complains of ongoing pain with Carple Tunnle.  Uses wrist splint at night. Still having bad numbness and tingling in left hand.  Has had previous release surgeries in both wrists.  Last Surgery was with [facility] in 2018.  PERTINENT  PMH / PSH: Onchomycosis, Carple Tunnel Syndrome  OBJECTIVE:   BP 122/80   Pulse 75   Ht 5\' 9"  (1.753 m)   Wt (!) 245 lb 12.8 oz (111.5 kg)   SpO2 96%   BMI 36.30 kg/m    Physical Exam Constitutional:      General: He is not in acute distress.    Appearance: Normal appearance. He is not ill-appearing.  HENT:     Head: Normocephalic and atraumatic.  Cardiovascular:     Rate and Rhythm: Normal rate and regular rhythm.  Pulmonary:     Effort: Pulmonary effort is normal.     Breath sounds: Normal breath sounds.  Musculoskeletal:        General: No swelling or tenderness.     Comments: Numbness and tingling in left wrist. Reproducible with Phalen test  Skin:    General: Skin is warm.     Findings: Erythema and rash present.     Comments: Maculopapular rash, small vesicles, erythematous non-tender to palpation  Neurological:     Mental  Status: He is alert.     ASSESSMENT/PLAN:   Carpal tunnel syndrome Conservative measures and night hand splints not providing relief.  Has had previous surgeries. - Refer to hand surgeon for further treatment  Rash Possible Atopic Dermatitis from Terbinafine, although rash confined to back of left arm and back.  Could also be Contact Dermatitis.  Also need to rule out Systemic process given patient's complex medical history. - Obtain CBC, CMP, HIV and RPR - Stop taking Terbinafine  - Prescribe Triamcinolone 0.5 cream daily after showering for 1 week.  After that can use PRN - Return if rash does not resolve in 1 week or if have worsening rash, fever or significant muscle or joint pain      Onychomycosis - Will stop Terbinafine treatment and refer to Podiatry for further treatment     Delora Fuel, MD [place]

## 2019-09-11 NOTE — Assessment & Plan Note (Signed)
Conservative measures and night hand splints not providing relief.  Has had previous surgeries. - Refer to hand surgeon for further treatment

## 2019-09-11 NOTE — Assessment & Plan Note (Signed)
Possible Atopic Dermatitis from Terbinafine, although rash confined to back of left arm and back.  Could also be Contact Dermatitis.  Also need to rule out Systemic process given patient's complex medical history. - Obtain CBC, CMP, HIV and RPR - Stop taking Terbinafine  - Prescribe Triamcinolone 0.5 cream daily after showering for 1 week.  After that can use PRN - Return if rash does not resolve in 1 week or if have worsening rash, fever or significant muscle or joint pain

## 2019-09-15 ENCOUNTER — Encounter
Payer: Medicaid Other | Attending: Physical Medicine and Rehabilitation | Admitting: Physical Medicine and Rehabilitation

## 2019-09-15 DIAGNOSIS — M25562 Pain in left knee: Secondary | ICD-10-CM | POA: Insufficient documentation

## 2019-09-15 DIAGNOSIS — G8929 Other chronic pain: Secondary | ICD-10-CM | POA: Insufficient documentation

## 2019-09-15 DIAGNOSIS — M542 Cervicalgia: Secondary | ICD-10-CM | POA: Insufficient documentation

## 2019-09-15 DIAGNOSIS — M25552 Pain in left hip: Secondary | ICD-10-CM | POA: Insufficient documentation

## 2019-09-15 DIAGNOSIS — M25561 Pain in right knee: Secondary | ICD-10-CM | POA: Insufficient documentation

## 2019-09-15 DIAGNOSIS — M170 Bilateral primary osteoarthritis of knee: Secondary | ICD-10-CM | POA: Insufficient documentation

## 2019-09-27 NOTE — Unmapped (Signed)
[facility] Clinical Assessment & Refill Coordination Note    Joseph Sandoval, DOB: 17-Mar-1970  Phone: There are no phone numbers on file.    All above HIPAA information was verified with patient.     Was a Nurse, learning disability used for this call? No    Specialty Medication(s):   Inflammatory Disorders: Humira and methotrexate (injectable)     Current Outpatient Medications   Medication Sig Dispense Refill   ??? acetaminophen (TYLENOL) 500 MG tablet Take 1,000 mg by mouth every six (6) hours as needed for pain.     ??? albuterol HFA 90 mcg/actuation inhaler INHALE 2 PUFFS EVERY SIX (6) HOURS AS NEEDED FOR WHEEZING. 20.1 g 6   ??? albuterol HFA 90 mcg/actuation inhaler INHALE 2 PUFFS EVERY SIX (6) HOURS AS NEEDED FOR WHEEZING. 20.1 g 3   ??? amLODIPine (NORVASC) 5 MG tablet Take 10 mg by mouth daily.      ??? baclofen (LIORESAL) 10 MG tablet Take 10 mg by mouth Three (3) times a day.     ??? benztropine (COGENTIN) 0.5 MG tablet Take 0.5 mg by mouth Two (2) times a day.     ??? buPROPion (WELLBUTRIN XL) 150 MG 24 hr tablet Take 150 mg by mouth daily.     ??? DULoxetine (CYMBALTA) 20 MG capsule Take 1 capsule (20 mg total) by mouth nightly. 30 capsule 2   ??? empty container Misc Use as directed 1 each PRN   ??? HUMIRA PEN CITRATE FREE 40 MG/0.4 ML Inject the contents of 1 pen (40mg ) under the skin every 7 days. 12 each 3   ??? hydroCHLOROthiazide (HYDRODIURIL) 25 MG tablet Take 25 mg by mouth daily.  4   ??? metoprolol tartrate (LOPRESSOR) 25 MG tablet Take 50 mg by mouth Two (2) times a day.      ??? mometasone-formoterol 200-5 mcg/actuation HFAA INHALE 2 PUFFS TWO (2) TIMES A DAY. 39 g 6   ??? mometasone-formoterol 200-5 mcg/actuation HFAA INHALE 2 PUFFS TWO (2) TIMES A DAY. 39 g 3   ??? ranitidine (ZANTAC) 150 MG tablet Take 150 mg by mouth Two (2) times a day. (Patient not taking: Reported on 06/08/2019)     ??? risperiDONE (RISPERDAL) 2 MG tablet Take 2 mg by mouth nightly.     ??? sertraline (ZOLOFT) 50 MG tablet Take 100 mg by mouth daily.  (Patient not taking: Reported on 06/08/2019)  1   ??? simvastatin (ZOCOR) 20 MG tablet Take 40 mg by mouth nightly.      ??? syringe with needle 1 mL 25 gauge x 5/8 Syrg Use as directed to inject methotrexate 12 each PRN     No current facility-administered medications for this visit.        Changes to medications: Joseph Sandoval reports no changes at this time.    Allergies   Allergen Reactions   ??? Lisinopril Swelling     swollen lips   ??? Varenicline Dermatitis and Rash     Other reaction(s): Other (See Comments)  Described as an acne like rash on his face  Also noted significant GI intolerance       Changes to allergies: No    SPECIALTY MEDICATION ADHERENCE     Humira 40mg /0.50ml: 0 days of medicine on hand   methotrexate 25mg /ml: 0 days of medicine on hand     Medication Adherence    Patient reported X missed doses in the last month: 0  Specialty Medication: Humira 40mg /0.66ml  Patient  is on additional specialty medications: Yes  Additional Specialty Medications: methotrexate 25mg /ml  Patient Reported Additional Medication X Missed Doses in the Last Month: 3-4  Patient is on more than two specialty medications: No  Adherence tools used: calendar          Specialty medication(s) dose(s) confirmed: Humira is correct and unchanged. Joseph Sandoval states he no longer wishes to take methotrexate injections due to the required monitoring and painful injection - he plans to follow up with his new rheumatologist in [place] when the establishes care with them.  He therefore declines pursuing a refill of that medication at this time.    Are there any concerns with adherence? Yes: I advised Joseph Sandoval that he should pursue care with a new rheumatologist or meet back with the [facility] clinic with regards to his plans surrounding stopping methotrexate.    Adherence counseling provided? Yes: I advised Joseph Sandoval continue his prescribed medication and monitoring regimen or schedule an appointment to discuss with his rheumatologist    CLINICAL MANAGEMENT AND INTERVENTION      Clinical Benefit Assessment:    Do you feel the medicine is effective or helping your condition? Patient declined to answer    Clinical Benefit counseling provided? Not needed    Adverse Effects Assessment:    Are you experiencing any side effects? No    Are you experiencing difficulty administering your medicine? No    Quality of Life Assessment:    Rheumatology:   Quality of Life    On a scale of 1 ??? 10 with 1 representing not at all and 10 representing completely ??? how has your rheumatologic condition affected your:  Daily pain level?: decline to answer  Ability to complete your regular daily tasks (prepare meals, get dressed, etc.)?: decline to answer  Ability to participate in social or family activities?: decline to answer         Have you discussed this with your provider? Not needed    Therapy Appropriateness:    Is therapy appropriate? Yes, therapy is appropriate and should be continued    DISEASE/MEDICATION-SPECIFIC INFORMATION      For patients on injectable medications: Patient currently has 0 Humira doses left.  Next injection is scheduled for 10/04/2019.    PATIENT SPECIFIC NEEDS     - Does the patient have any physical, cognitive, or cultural barriers? No    - Is the patient high risk? No    - Does the patient require a Care Management Plan? No     - Does the patient require physician intervention or other additional services (i.e. nutrition, smoking cessation, social work)? No      SHIPPING     Specialty Medication(s) to be Shipped:   Inflammatory Disorders: Humira 40mg /0.85ml    Other medication(s) to be shipped: No additional medications requested for fill at this time     Changes to insurance: No    Delivery Scheduled: Yes, Expected medication delivery date: 09/30/2019.     Medication will be delivered via UPS to the confirmed prescription address in [facility].    The patient will receive a drug information handout for each medication shipped and additional FDA Medication Guides as required.  Verified that patient has previously received a Conservation officer, historic buildings.    All of the patient's questions and concerns have been addressed.    Karene Fry Jessy Calixte   [facility] Specialty Pharmacist

## 2019-09-29 MED FILL — HUMIRA PEN CITRATE FREE 40 MG/0.4 ML: 84 days supply | Qty: 12 | Fill #2 | Status: AC

## 2019-09-29 MED FILL — HUMIRA PEN CITRATE FREE 40 MG/0.4 ML: SUBCUTANEOUS | 84 days supply | Qty: 12 | Fill #2

## 2019-10-05 ENCOUNTER — Ambulatory Visit (INDEPENDENT_AMBULATORY_CARE_PROVIDER_SITE_OTHER): Payer: Medicaid Other | Admitting: Family Medicine

## 2019-10-05 ENCOUNTER — Other Ambulatory Visit: Payer: Self-pay

## 2019-10-05 VITALS — BP 140/82 | HR 72 | Wt 248.0 lb

## 2019-10-05 DIAGNOSIS — K148 Other diseases of tongue: Secondary | ICD-10-CM

## 2019-10-05 NOTE — Progress Notes (Signed)
   SUBJECTIVE:   CHIEF COMPLAINT / HPI:   Chief Complaint  Patient presents with  . Mouth Lesions     Blake Arnold is a 49 y.o. male here for mouth lesion.  Mouth lesion Patient states that he has been biting his tongue and about a year ago 2 bumps appeared.  One of the bumps went away and the other 1 has persisted.  The one that remains has been hurting intermittently.  Bump in his mouth is white.  Denies discharge from area.  There has been a change in the size of it over time.  There is pain associated when the bump hits his teeth.  Denies grinding his teeth at night.  Patient uses tobacco and has smoked 1 pack/day for the past 25+ years.  He drinks about 6 beers a week.  Patient does have a history of RA and takes methotrexate and Humira.  Has tried no treatments.  Reports he was googling online and decided to get it checked out.    PERTINENT  PMH / PSH: reviewed and updated as appropriate   OBJECTIVE:   BP 140/82   Pulse 72   Wt 248 lb (112.5 kg)   SpO2 96%   BMI 36.62 kg/m    GEN: pleasant, non-ill-appearing male, in no acute distress  HENT: Bilateral TMs normal, mucous membranes moist, tongue with well circumscribed white raised lesion on the left lateral border (see image below) CV: regular rate and rhythm, no murmurs appreciated RESP: no increased work of breathing, clear to ascultation bilaterally  SKIN: warm, dry           ASSESSMENT/PLAN:   Tongue lesion 49 yo male with white well circumscribed lesion in the mouth uncertain prognosis.  Will refer to ENT for biopsy. Patient with risk factors for oral cancer. Differential diagnosis includes: Oral cancer, granulomatous tissue related to frequent trauma, leukoplakia. Not likely candidal as it did not scrape off  - follow up with ENT      Lyndee Hensen, DO PGY-2, [facility] 10/05/2019

## 2019-10-05 NOTE — Patient Instructions (Signed)
It was great seeing you today!  Please check-out at the front desk before leaving the clinic. I am referring you to otolaryngology, ear nose and throat doctor (ENT). You will get a phone call regarding your appointment.      Feel free to call with any questions or concerns at any time, at [phone].   Take care,  Dr. Rushie [facility]

## 2019-10-06 ENCOUNTER — Encounter: Payer: Self-pay | Admitting: Orthopaedic Surgery

## 2019-10-06 ENCOUNTER — Ambulatory Visit (INDEPENDENT_AMBULATORY_CARE_PROVIDER_SITE_OTHER): Payer: Medicaid Other | Admitting: Orthopaedic Surgery

## 2019-10-06 DIAGNOSIS — G5602 Carpal tunnel syndrome, left upper limb: Secondary | ICD-10-CM

## 2019-10-06 NOTE — Progress Notes (Signed)
Office Visit Note   Patient: Blake Arnold           Date of Birth: [birth year]           MRN: [id number] Visit Date: 10/06/2019              Requested by: Zenia Resides, MD [address] [place],  [place] [address] PCP: [name], DO   Assessment & Plan: Visit Diagnoses:  1. Left carpal tunnel syndrome     Plan: Impression is recurrent left carpal tunnel syndrome status post 2 carpal tunnel releases.  Given the surgical history I feel that this is best handled by a hand fellowship trained surgeon therefore I will make a referral to the hand center.  Patient in agreement.  Follow-up as needed.  Follow-Up Instructions: Return if symptoms worsen or fail to improve.   Orders:  No orders of the defined types were placed in this encounter.  No orders of the defined types were placed in this encounter.     Procedures: No procedures performed   Clinical Data: No additional findings.   Subjective: Chief Complaint  Patient presents with  . Left Wrist - Pain    Tim is a 49 year old gentleman comes in for evaluation of recurrent left hand pain and numbness and tingling and symptoms that are reminiscent of carpal tunnel syndrome.  He has undergone 2 carpal tunnel releases previously and the most recent one was done in [place] in 2018 by Dr.Draeger.  He states that he had relief until recently.  He is currently wearing wrist braces at nighttime.  He had a cortisone injection about 2 to 3 months ago which did not help.  He is currently in pain clinic.  He has rheumatoid arthritis and takes Humira.   Review of Systems  Constitutional: Negative.   All other systems reviewed and are negative.    Objective: Vital Signs: There were no vitals taken for this visit.  Physical Exam Vitals and nursing note reviewed.  Constitutional:      Appearance: He is well-developed.  HENT:     Head: Normocephalic and atraumatic.  Eyes:     Pupils: Pupils are  equal, round, and reactive to light.  Pulmonary:     Effort: Pulmonary effort is normal.  Abdominal:     Palpations: Abdomen is soft.  Musculoskeletal:        General: Normal range of motion.     Cervical back: Neck supple.  Skin:    General: Skin is warm.  Neurological:     Mental Status: He is alert and oriented to person, place, and time.  Psychiatric:        Behavior: Behavior normal.        Thought Content: Thought content normal.        Judgment: Judgment normal.     Ortho Exam Left hand shows a fully healed surgical scar.  Mild muscle atrophy of the thenar compartment.  Positive carpal tunnel compressive signs.  Decreased sensation in the median nerve distribution. Specialty Comments:  No specialty comments available.  Imaging: No results found.   PMFS History: Patient Active Problem List   Diagnosis Date Noted  . Rash 09/11/2019  . Carpal tunnel syndrome 08/24/2019  . Onychomycosis 08/24/2019  . Cervicalgia 06/02/2019  . Left hip pain 04/06/2019  . Abdominal pain 02/28/2019  . Shoulder pain 12/17/2018  . Hyperlipidemia 09/01/2018  . HTN (hypertension) 07/24/2017  . Seizure ([place]) 07/24/2017  . RA (rheumatoid  arthritis) ([place]) 07/24/2017  . Appendicitis 07/23/2017  . Alcohol abuse with alcohol-induced mood disorder ([place]) 07/26/2013  . Bipolar disorder, unspecified ([place]) 10/28/2012  . Alcohol dependence ([place]) 04/02/2012  . Alcohol withdrawal ([place]) 04/02/2012  . Major depression 04/02/2012  . Legal problem 04/22/2011  . Polysubstance abuse ([place]) 01/25/2011  . Bronchiolitis 01/25/2011  . Alcohol abuse 01/22/2011  . Chest pain 01/22/2011  . TOBACCO ABUSE 02/09/2010  . SUBSTANCE ABUSE 02/09/2010  . FIBRILLATION, ATRIAL 06/08/2009   Past Medical History:  Diagnosis Date  . A-fib ([place])   . Acid reflux   . Alcohol abuse   . Anxiety   . Asthma   . AVN (avascular necrosis of bone) ([place])   . Chronic back pain   . COPD (chronic obstructive pulmonary disease)  ([place])   . Depression   . Hepatitis C    history of  . History of urinary frequency   . Hypertension   . Peripheral neuropathy    hands and feet  . Polysubstance abuse ([place]) 01/25/2011  . 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[place])   . 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[place])   . Seizures ([place])     Family History  Problem Relation Age of Onset  . Heart attack Mother   . Atrial fibrillation Mother   . Skin cancer Father   . Colon polyps Neg Hx   . Esophageal cancer Neg Hx   . Stomach cancer Neg Hx   . Rectal cancer Neg Hx     Past Surgical History:  Procedure Laterality Date  . APPENDECTOMY    . CARDIOVERSION  03/07/2006  . CARPAL TUNNEL RELEASE    . CERVICAL FUSION    . LAPAROSCOPIC APPENDECTOMY N/A 07/23/2017   Procedure: APPENDECTOMY LAPAROSCOPIC;  Surgeon: Georganna Skeans, MD;  Location: [facility];  Service: General;  Laterality: N/A;  . TOTAL HIP ARTHROPLASTY Right 2016   Social History   Occupational History  . Not on file  Tobacco Use  . Smoking status: Current Every Day Smoker    Packs/day: 2.00    Years: 34.00    Pack years: 68.00    Types: Cigarettes    Start date: 02/12/1984  . Smokeless tobacco: Former Systems developer    Quit date: 10/24/2012  . Tobacco comment: 1.5-2ppd  Quit attempt 12/24/2018  Vaping Use  . Vaping Use: Never used  Substance and Sexual Activity  . Alcohol use: Yes    Comment: socially  . Drug use: Not Currently    Types: "Crack" cocaine, Cocaine    Comment: last use 07/26/13  . Sexual activity: Never

## 2019-10-07 ENCOUNTER — Other Ambulatory Visit: Payer: Self-pay

## 2019-10-07 ENCOUNTER — Ambulatory Visit (INDEPENDENT_AMBULATORY_CARE_PROVIDER_SITE_OTHER): Payer: Medicaid Other | Admitting: Podiatry

## 2019-10-07 DIAGNOSIS — B351 Tinea unguium: Secondary | ICD-10-CM | POA: Diagnosis not present

## 2019-10-07 DIAGNOSIS — M79674 Pain in right toe(s): Secondary | ICD-10-CM | POA: Diagnosis not present

## 2019-10-07 DIAGNOSIS — M79675 Pain in left toe(s): Secondary | ICD-10-CM

## 2019-10-07 MED ORDER — NEOMYCIN-POLYMYXIN-HC 3.5-10000-1 OT SOLN
OTIC | 0 refills | Status: DC
Start: 2019-10-07 — End: 2021-02-26

## 2019-10-07 NOTE — Patient Instructions (Signed)

## 2019-10-08 ENCOUNTER — Other Ambulatory Visit: Payer: Self-pay

## 2019-10-08 DIAGNOSIS — G5602 Carpal tunnel syndrome, left upper limb: Secondary | ICD-10-CM

## 2019-10-08 NOTE — Progress Notes (Signed)
Subjective:   Patient ID: Blake Arnold, male   DOB: 49 y.o.   MRN: [id number]   HPI Patient presents in very poor health who smokes 2 packs of cigarettes a day and is obese and also has severe gout that is been going on for a long period of time.  Patient has severe nail disease that he cannot take care of and that is why he is here today   Review of Systems  All other systems reviewed and are negative.       Objective:  Physical Exam Vitals and nursing note reviewed.  Constitutional:      Appearance: He is well-developed.  Pulmonary:     Effort: Pulmonary effort is normal.  Musculoskeletal:        General: Normal range of motion.  Skin:    General: Skin is warm.  Neurological:     Mental Status: He is alert.     Vascular status mildly diminished but intact with patient found to have swelling of both feet and severe gout with tophaceous deposits occurring left over right foot with what appears to be collapse of the first MPJ.  Patient is not in good social health either and does not take care of himself in any way.  Digital perfusion was noted to be within normal limits with nails found to be severely thickened 1-5 both feet incurvated and impossible for him to cut     Assessment:  Very difficult individual with severe gout that is multiorgan and its presentation with thick yellow brittle nailbeds 1-5 both feet     Plan:  H&P reviewed all conditions.  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

## 2019-10-11 ENCOUNTER — Encounter: Payer: Self-pay | Admitting: Family Medicine

## 2019-10-11 ENCOUNTER — Encounter: Payer: Self-pay | Admitting: Podiatry

## 2019-10-11 DIAGNOSIS — K148 Other diseases of tongue: Secondary | ICD-10-CM | POA: Insufficient documentation

## 2019-10-11 NOTE — Assessment & Plan Note (Signed)
49 yo male with white well circumscribed lesion in the mouth uncertain prognosis.  Will refer to ENT for biopsy. Patient with risk factors for oral cancer. Differential diagnosis includes: Oral cancer, granulomatous tissue related to frequent trauma, leukoplakia. Not likely candidal as it did not scrape off  - follow up with ENT

## 2019-10-13 NOTE — Progress Notes (Addendum)
    SUBJECTIVE:   CHIEF COMPLAINT / HPI: Rash  Blake Arnold complaining of rash.  Indicates ths started 3 days ago when his wife first noticed 3 days ago on his back.  Also had small spot onl ip which has since dissapeared Indicates becomes itchy when he is out in sun.  Also has some underlying muscle pain in his triceps where rash was located.  Rash has not expresed fluid.  Patient has Rheumatoid Arthritis but denies any new joint pain.  Denies any fevers cough or sick contacts.  Has never tested positive for herpes.  Had Hepatits C whic hwas effectively treated.  Drinks 1-3 beers a day and smokes a pack a day.  Likes spending time outside and does yardwork frequently. Recently started on Terbinafine for Onchomycoses, patient believes this may be causing rash.   Patient also complains of ongoing pain with Carple Tunnle.  Uses wrist splint at night. Still having bad numbness and tingling in left hand.  Has had previous release surgeries in both wrists.  Last Surgery was with [facility] in 2018.  PERTINENT  PMH / PSH: Onchomycosis, Carple Tunnel Syndrome  OBJECTIVE:   BP 122/80   Pulse 75   Ht 5\' 9"  (1.753 m)   Wt (!) 245 lb 12.8 oz (111.5 kg)   SpO2 96%   BMI 36.30 kg/m    Physical Exam Constitutional:      General: He is not in acute distress.    Appearance: Normal appearance. He is not ill-appearing.  HENT:     Head: Normocephalic and atraumatic.  Cardiovascular:     Rate and Rhythm: Normal rate and regular rhythm.  Pulmonary:     Effort: Pulmonary effort is normal.     Breath sounds: Normal breath sounds.  Musculoskeletal:        General: No swelling or tenderness.     Comments: Numbness and tingling in left wrist. Reproducible with Phalen test  Skin:    General: Skin is warm.     Findings: Erythema and rash present.     Comments: Maculopapular rash, small vesicles, erythematous non-tender to palpation  Neurological:     Mental Status: He is alert.     ASSESSMENT/PLAN:    Carpal tunnel syndrome Conservative measures and night hand splints not providing relief.  Has had previous surgeries. - Refer to hand surgeon for further treatment  Rash Possible Atopic Dermatitis from Terbinafine, although rash confined to back of left arm and back.  Could also be Contact Dermatitis.  Also need to rule out Systemic process given patient's complex medical history. - Obtain CBC, CMP, HIV and RPR - Stop taking Terbinafine  - Prescribe Triamcinolone 0.5 cream daily after showering for 1 week.  After that can use PRN - Return if rash does not resolve in 1 week or if have worsening rash, fever or significant muscle or joint pain      Onychomycosis - Will stop Terbinafine treatment and refer to Podiatry for further treatment     Delora Fuel, MD [place]

## 2019-10-18 ENCOUNTER — Ambulatory Visit (INDEPENDENT_AMBULATORY_CARE_PROVIDER_SITE_OTHER): Payer: Medicaid Other

## 2019-10-18 ENCOUNTER — Other Ambulatory Visit: Payer: Self-pay

## 2019-10-18 ENCOUNTER — Encounter (HOSPITAL_COMMUNITY): Payer: Self-pay | Admitting: Emergency Medicine

## 2019-10-18 ENCOUNTER — Ambulatory Visit (HOSPITAL_COMMUNITY)
Admission: EM | Admit: 2019-10-18 | Discharge: 2019-10-18 | Disposition: A | Discharge status: Home / Self Care | Payer: Medicaid Other | Attending: Family Medicine | Admitting: Family Medicine

## 2019-10-18 DIAGNOSIS — M25552 Pain in left hip: Secondary | ICD-10-CM | POA: Diagnosis not present

## 2019-10-18 DIAGNOSIS — M87052 Idiopathic aseptic necrosis of left femur: Secondary | ICD-10-CM | POA: Diagnosis not present

## 2019-10-18 MED ORDER — TRAMADOL HCL 50 MG PO TABS: 50.0000 mg | ORAL_TABLET | Freq: Three times a day (TID) | ORAL | 0 refills | Status: DC | PRN

## 2019-10-18 NOTE — ED Provider Notes (Signed)
[place]    CSN: [id number] Arrival date & time: 10/18/19  0802      History   Chief Complaint Chief Complaint  Patient presents with  . Hip Pain    HPI Blake Arnold is a 49 y.o. male.   Here today for 3 days of acute on chronic left hip pain. States onset was sudden, occurred upon waking 3 days ago with no inciting injury or event. 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 Typically takes NSAIDs and several neuropathic pain medications daily for this. These have not been taking the edge off since things have been worsening. Denies fever, chills, discoloration, numbness or tingling down the leg into foot.      Past Medical History:  Diagnosis Date  . A-fib ([place])   . Acid reflux   . Alcohol abuse   . Anxiety   . Asthma   . AVN (avascular necrosis of bone) ([place])   . Chronic back pain   . COPD (chronic obstructive pulmonary disease) ([place])   . Depression   . Hepatitis C    history of  . History of urinary frequency   . Hypertension   . Peripheral neuropathy    hands and feet  . Polysubstance abuse ([place]) 01/25/2011  . 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[place])   . 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[place])   . Seizures [facility])     Patient Active Problem List   Diagnosis Date Noted  . Tongue lesion 10/11/2019  . Rash 09/11/2019  . Carpal tunnel syndrome 08/24/2019  . Onychomycosis 08/24/2019  . Cervicalgia 06/02/2019  . Left hip pain 04/06/2019  . Abdominal pain 02/28/2019  . Shoulder pain 12/17/2018  . Hyperlipidemia 09/01/2018  . HTN (hypertension) 07/24/2017  . Seizure ([place]) 07/24/2017  . RA (rheumatoid arthritis) ([place]) 07/24/2017  . Numbness in feet 03/04/2016  . Degenerative joint disease 12/28/2015  . OSA (obstructive sleep apnea) 11/23/2015  . 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 Alcohol abuse with alcohol-induced mood disorder ([place]) 07/26/2013  . Bipolar disorder,  unspecified ([place]) 10/28/2012  . Alcohol dependence ([place]) 04/02/2012  . Alcohol withdrawal ([place]) 04/02/2012  . Major depression 04/02/2012  . Legal problem 04/22/2011  . Polysubstance abuse ([place]) 01/25/2011  . Alcohol abuse 01/22/2011  . Chest pain 01/22/2011  . TOBACCO ABUSE 02/09/2010  . SUBSTANCE ABUSE 02/09/2010  . Tobacco dependence syndrome 02/09/2010  . FIBRILLATION, ATRIAL 06/08/2009    Past Surgical History:  Procedure Laterality Date  . APPENDECTOMY    . CARDIOVERSION  03/07/2006  . CARPAL TUNNEL RELEASE    . CERVICAL FUSION    . LAPAROSCOPIC APPENDECTOMY N/A 07/23/2017   Procedure: APPENDECTOMY LAPAROSCOPIC;  Surgeon: Georganna Skeans, MD;  Location: [place];  Service: General;  Laterality: N/A;  . TOTAL HIP ARTHROPLASTY Right 2016       Home Medications    Prior to Admission medications   Medication Sig Start Date End Date Taking? Authorizing Provider  Adalimumab (HUMIRA) 40 MG/0.8ML PSKT Inject 0.8 mLs (40 mg total) into the skin once a week. tuesdays Patient taking differently: Inject 0.8 mLs into the skin once a week. Sunday 08/05/17 04/06/19  Saverio Danker, PA-C  Albuterol Sulfate (PROAIR RESPICLICK) 476 (90 Base) MCG/ACT AEPB Inhale 2 puffs into the lungs every 4 (four) hours as needed (wheezing; cough). 06/21/19   [name], DO  amitriptyline (ELAVIL) 10 MG tablet Take 1 tablet (10 mg total) by mouth at  bedtime. 06/22/19   Raulkar, Clide Deutscher, MD  amLODipine (NORVASC) 5 MG tablet Take 1 tablet (5 mg total) by mouth at bedtime. 07/15/19   [name], DO  aspirin EC 81 MG tablet Take 1 tablet (81 mg total) by mouth daily. 07/15/19   [name], DO  baclofen (LIORESAL) 10 MG tablet Take 10 mg by mouth 3 (three) times daily.    [provider]  buPROPion (WELLBUTRIN XL) 150 MG 24 hr tablet Take 1 tablet (150 mg total) by mouth daily. Take 1 tablet QAM for 2 weeks then increase to 2 tablets QAM. 07/15/19   [name], DO  diclofenac Sodium  (VOLTAREN) 1 % GEL Apply 1 application topically 3 (three) times daily.    [provider]  famotidine (PEPCID) 10 MG tablet Take 1 tablet (10 mg total) by mouth 2 (two) times daily. 06/02/19   Bonnita Hollow, MD  FOLIC ACID PO Take by mouth.    [provider]  hydrochlorothiazide (HYDRODIURIL) 25 MG tablet TAKE 1 TABLET(25 MG) BY MOUTH DAILY 08/31/19   [name], DO  ibuprofen (ADVIL) 800 MG tablet Take 800 mg by mouth every 6 (six) hours. 04/14/19   [provider]  meloxicam (MOBIC) 15 MG tablet Take 1 tablet (15 mg total) by mouth daily. 07/20/19   Raulkar, Clide Deutscher, MD  Methotrexate, Anti-Rheumatic, (METHOTREXATE, PF, ) Inject 1 mL into the skin once a week.     [provider]  metoprolol tartrate (LOPRESSOR) 50 MG tablet Take 1 tablet (50 mg total) by mouth 2 (two) times daily. 07/15/19   [name], DO  neomycin-polymyxin-hydrocortisone (CORTISPORIN) OTIC solution Apply 1-2 drops to toe after soaking twice a day 10/07/19   Wallene Huh, DPM  pantoprazole (PROTONIX) 40 MG tablet Take 1 tablet (40 mg total) by mouth daily. 02/25/19   Carollee Leitz, MD  simvastatin (ZOCOR) 40 MG tablet Take 1 tablet (40 mg total) by mouth daily. 07/15/19   [name], DO  terbinafine (LAMISIL) 250 MG tablet Take 1 tablet (250 mg total) by mouth daily. 08/24/19   Matilde Haymaker, MD  traMADol (ULTRAM) 50 MG tablet Take 1 tablet (50 mg total) by mouth 3 (three) times daily as needed. 10/18/19   Volney American, PA-C  triamcinolone ointment (KENALOG) 0.5 % Apply 1 application topically 2 (two) times daily. 09/08/19   Delora Fuel, MD    Family History Family History  Problem Relation Age of Onset  . Heart attack Mother   . Atrial fibrillation Mother   . Skin cancer Father   . Colon polyps Neg Hx   . Esophageal cancer Neg Hx   . Stomach cancer Neg Hx   . Rectal cancer Neg Hx     Social History Social History   Tobacco Use  . Smoking status:  Current Every Day Smoker    Packs/day: 2.00    Years: 34.00    Pack years: 68.00    Types: Cigarettes    Start date: 02/12/1984  . Smokeless tobacco: Former Systems developer    Quit date: 10/24/2012  . Tobacco comment: 1.5-2ppd  Quit attempt 12/24/2018  Vaping Use  . Vaping Use: Never used  Substance Use Topics  . Alcohol use: Yes    Comment: socially  . Drug use: Not Currently    Types: "Crack" cocaine, Cocaine    Comment: last use 07/26/13     Allergies   Lisinopril and Varenicline   Review of Systems Review of Systems  PER  HPI  Physical Exam Triage Vital Signs ED Triage Vitals  Enc Vitals Group     BP 10/18/19 0819 (!) 146/87     Pulse Rate 10/18/19 0819 84     Resp 10/18/19 0819 19     Temp 10/18/19 0819 98.4 F (36.9 C)     Temp Source 10/18/19 0819 Oral     SpO2 10/18/19 0819 96 %     Weight --      Height --      Head Circumference --      Peak Flow --      Pain Score 10/18/19 0818 8     Pain Loc --      Pain Edu? --      Excl. in [place]? --    No data found.  Updated Vital Signs BP (!) 146/87 (BP Location: Right Arm)   Pulse 84   Temp 98.4 F (36.9 C) (Oral)   Resp 19   SpO2 96%   Visual Acuity Right Eye Distance:   Left Eye Distance:   Bilateral Distance:    Right Eye Near:   Left Eye Near:    Bilateral Near:     Physical Exam Vitals and nursing note reviewed.  Constitutional:      Appearance: Normal appearance.  HENT:     Head: Atraumatic.  Eyes:     Extraocular Movements: Extraocular movements intact.     Conjunctiva/sclera: Conjunctivae normal.  Cardiovascular:     Rate and Rhythm: Normal rate and regular rhythm.     Heart sounds: Normal heart sounds.  Pulmonary:     Effort: Pulmonary effort is normal.     Breath sounds: Normal breath sounds.  Musculoskeletal:        General: No deformity.     Cervical back: Normal range of motion and neck supple.     Comments: Antalgic gait  Skin:    General: Skin is warm and dry.     Findings: No  bruising, erythema or rash.  Neurological:     General: No focal deficit present.     Mental Status: He is oriented to person, place, and time.  Psychiatric:        Mood and Affect: Mood normal.        Thought Content: Thought content normal.        Judgment: Judgment normal.     UC Treatments / Results  Labs (all labs ordered are listed, but only abnormal results are displayed) Labs Reviewed - No data to display  EKG   Radiology DG Hip Unilat W or Wo Pelvis 2-3 Views Left  Result Date: 10/18/2019 CLINICAL DATA:  Acute on chronic left hip pain without known injury. EXAM: DG HIP (WITH OR WITHOUT PELVIS) 2-3V LEFT COMPARISON:  March 29, 2019. FINDINGS: Status post right total hip arthroplasty. No acute fracture or dislocation is noted. No significant joint space narrowing is noted. Mild sclerosis is seen involving the superior portion of the left femoral head suggesting early avascular necrosis. IMPRESSION: Mild sclerosis is seen involving superior portion of left femoral head suggesting early avascular necrosis. No acute fracture or dislocation is noted. Electronically Signed   By: Marijo Conception M.D.   On: 10/18/2019 08:48    Procedures Procedures (including critical care time)  Medications Ordered in UC Medications - No data to display  Initial Impression / Assessment and Plan / UC Course  I have reviewed the triage vital signs and the nursing notes.  Pertinent labs &  imaging results that were available during my care of the patient were reviewed by me and considered in my medical decision making (see chart for details).     Left hip x-ray negative for progression of his known early avascular necrosis, dislocation, or collapse of joint. Small supply of tramadol given for pain relief prn in addition to his current regimen and OTC pain relievers to bridge until he can resume care with his orthopedic specialist for further instruction. Avoid significant physical exertion as  able. Discussed strict return precautions as well as narcotic precautions/addictive potential and to avoid use with alcohol or other sedating medications.  PDMP reviewed and appropriate.   Final Clinical Impressions(s) / UC Diagnoses   Final diagnoses:  Left hip pain  Avascular necrosis of bone of hip, left [facility])   Discharge Instructions   None    ED Prescriptions    Medication Sig Dispense Auth. Provider   traMADol (ULTRAM) 50 MG tablet Take 1 tablet (50 mg total) by mouth 3 (three) times daily as needed. 15 tablet Volney American, [place]     I have reviewed the PDMP during this encounter.   Volney American, [place] 10/18/19 1014

## 2019-10-18 NOTE — ED Triage Notes (Signed)
Pt presents with left hip pain xs 3 days.

## 2019-10-29 ENCOUNTER — Ambulatory Visit: Admit: 2019-10-29 | Attending: Internal Medicine | Primary: Internal Medicine

## 2019-10-29 NOTE — Unmapped (Unsigned)
RHEUMATOLOGY CLINIC FOLLOW-UP NOTE      Assessment/Plan:      Seropositive nonerosive RA  Briefly: Diagnosed 2017. Previously on MTX/HCQ/adalimumab now down-titrated to MTX/adalimumab. Since last visit ***   -- Labs: MTX monitoring labs  -- Medications: Cont adalimumab weekly, MTX 25mg  subcutaneous weekly; folic acid 1mg  daily purchased OTC.     High risk medication use  -- Vaccination: covid ***    RTC in No follow-ups on file.    This patient was seen and discussed with Dr. Ellamae Sia who agrees with the plan outlined above.    Subjective:   Primary Care Provider: [facility]    HPI:  Joseph Sandoval is a 49 y.o.  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 Last seen in clinic 03/2019 virtually at which time he was continued on MTX/adalimumab. Since last visit patient ***    Further ROS as below.    Disease History:  -- ***    Review of Systems    PMH, PFH, PSH as previously documented.  Medications were reviewed this visit.    Objective:   There were no vitals filed for this visit.  There is no height or weight on file to calculate BMI.    Physical Exam  -- GENERAL: sitting up; no acute distress; appears comfortable  -- HEAD: atraumatic  -- EYES: normal sclera; conjunctivae are clear  -- ENT: MMM; no mucosal or nasal lesions  -- NECK: supple; no palpable lymphadenopathy  -- CARDIO: RRR; no g/r/m  -- PULM: clear to auscultation bilaterally without crackles, wheezing, or coarse breath sounds  -- GI: soft   -- SKIN: no diffuse rashes  -- NEURO: alert; oriented; moving all extremities    MSK:      ROM: full ROM bilateral shoulders, elbows, wrists, UE digits, knees, ankles, LE digits      No TTP, swelling, or erythema of bilateral shoulders, elbows, wrists, UE digits, knees, ankles, LE digits from this visit.   Latest known visit with results is:   Office Visit on 04/13/2018   Component Date Value   ??? AST 04/13/2018 38    ??? ALT 04/13/2018 57*   ??? BUN 04/13/2018 13    ??? Creatinine 04/13/2018 0.78    ??? EGFR CKD-EPI Non-African* 04/13/2018 >90    ??? EGFR CKD-EPI African Ame* 04/13/2018 >90    ??? WBC 04/13/2018 9.5    ??? RBC 04/13/2018 5.00    ??? HGB 04/13/2018 17.0    ??? HCT 04/13/2018 50.3    ??? MCV 04/13/2018 100.7*   ??? MCH 04/13/2018 34.0    ??? MCHC 04/13/2018 33.8    ??? RDW 04/13/2018 14.3    ??? MPV 04/13/2018 6.9*   ??? Platelet 04/13/2018 236    ??? Neutrophils % 04/13/2018 53.3    ??? Lymphocytes % 04/13/2018 33.2    ??? Monocytes % 04/13/2018 5.3    ??? Eosinophils % 04/13/2018 2.5    ??? Basophils % 04/13/2018 1.1    ??? Absolute Neutrophils 04/13/2018 5.1    ??? Absolute Lymphocytes 04/13/2018 3.2    ??? Absolute Monocytes 04/13/2018 0.5    ??? Absolute Eosinophils 04/13/2018 0.2    ??? Absolute Basophils 04/13/2018 0.1    ??? Large Unstained Cells 04/13/2018 5*   ??? Macrocytosis 04/13/2018 Moderate*

## 2019-11-10 ENCOUNTER — Other Ambulatory Visit: Payer: Self-pay | Admitting: Family Medicine

## 2019-12-12 ENCOUNTER — Other Ambulatory Visit: Payer: Self-pay | Admitting: Family Medicine

## 2019-12-21 DIAGNOSIS — M059 Rheumatoid arthritis with rheumatoid factor, unspecified: Principal | ICD-10-CM

## 2019-12-21 MED ORDER — HUMIRA PEN CITRATE FREE 40 MG/0.4 ML
SUBCUTANEOUS | 3 refills | 84 days
Start: 2019-12-21 — End: ?

## 2019-12-21 NOTE — Unmapped (Signed)
The [facility] has made a second and final attempt to reach this patient to refill the following medication:Humira.      We have left voicemails on the following phone numbers: [phone] and have sent a MyChart message.    Dates contacted: 11/3 and 11/9  Last scheduled delivery: 09/30/19    The patient may be at risk of non-compliance with this medication. The patient should call the [facility] at [phone] (option 4) to refill medication.    Unk Lightning   [facility] Specialty Technician

## 2019-12-22 ENCOUNTER — Other Ambulatory Visit: Payer: Self-pay

## 2019-12-22 ENCOUNTER — Encounter
Payer: Medicaid Other | Attending: Physical Medicine and Rehabilitation | Admitting: Physical Medicine and Rehabilitation

## 2019-12-22 ENCOUNTER — Encounter: Payer: Self-pay | Admitting: Physical Medicine and Rehabilitation

## 2019-12-22 VITALS — BP 135/88 | HR 88 | Temp 98.4°F | Ht 69.0 in | Wt 247.0 lb

## 2019-12-22 DIAGNOSIS — M059 Rheumatoid arthritis with rheumatoid factor, unspecified: Principal | ICD-10-CM

## 2019-12-22 DIAGNOSIS — M25552 Pain in left hip: Secondary | ICD-10-CM | POA: Insufficient documentation

## 2019-12-22 DIAGNOSIS — M542 Cervicalgia: Secondary | ICD-10-CM | POA: Insufficient documentation

## 2019-12-22 MED ORDER — HUMIRA PEN CITRATE FREE 40 MG/0.4 ML
SUBCUTANEOUS | 2 refills | 84 days | Status: CP
Start: 2019-12-22 — End: ?

## 2019-12-22 MED ORDER — DICLOFENAC SODIUM 1 % EX GEL
1.0000 | Freq: Three times a day (TID) | CUTANEOUS | 3 refills | Status: DC
Start: 2019-12-22 — End: 2022-11-07

## 2019-12-22 MED ORDER — GABAPENTIN 300 MG PO CAPS: 300.0000 mg | ORAL_CAPSULE | Freq: Three times a day (TID) | ORAL | 3 refills | Status: DC

## 2019-12-22 NOTE — Patient Instructions (Signed)
-  Discussed following foods that may reduce pain: 1) Ginger 2) Blueberries 3) Salmon 4) Pumpkin seeds 5) dark chocolate 6) turmeric 7) tart cherries 8) virgin olive oil 9) chilli peppers 10) mint 11) red wine .   

## 2019-12-22 NOTE — Progress Notes (Addendum)
Subjective:    Patient ID: Blake Arnold, male    DOB: 05-Sep-1970, 49 y.o.   MRN: [id number]  HPI  Today a dog chased him and knocked him down. It was a pit-mix. He did not get bit. He has a lit bit of burning pain in his arms, numbness in both arms, and neck pain. He wants to see if he could try a higher dose of his Gabapentin.  He loves his job. He is a Clinical cytogeneticist. He has been wanting to go back to work for a long time.   He has tried ginger and would be interested in alit of pain relieving foods.   Maintains weight at 247 lbs  Prior history: Blake Arnold is a 49 year old man who presents for follow-up of chronic pain in multiple joints including his bilateral shoulders, knees, hips, and legs. He has been diagnosed with both rheumatoid arthritis and osteoarthritis. He is on methotrexate for his RA but has not taken this in the past 2 months.   His worst pain is currently his right knee pain. His pain has been present for 30 years but the knee pain has been aggravating him recently. He has tried Mobic, Ibuprofen, Tylenol, Tramadol, Roxicodone, Vicodin. He has been on 4000mg  of Gabapentin without any benefit. I prescribed Lyrica, Cymbalta, and Amitriptyline previously and he did not tolerate these well. 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 I repeated XR of his knees previsouly, personally reviewed and discussed with patient, and they show no evidence of OA. Knee symptoms are worse when ascending and descending stairs and after sitting for prolonged periods. Discussed the importance of weight loss last visit and he has managed to lose 10 lbs in the past month! He started working but this has exacerbated his pain and he does not feel that he can continue it. He remains quite active and is on his feet most of the day. The worst pain currently is cervical pain that radiates into his arms occasionally, worse on the left side. He does not want any  narcotic medication as he has a history of addiction.   Pain Inventory Average Pain 6 Pain Right Now 8 My pain is sharp, burning, stabbing, tingling and aching  In the last 24 hours, has pain interfered with the following? General activity 6 Relation with others 6 Enjoyment of life 6 What TIME of day is your pain at its worst? all Sleep (in general) Fair  Pain is worse with: walking, bending, sitting and standing Pain improves with: therapy/exercise and pacing activities Relief from Meds: 4     Family History  Problem Relation Age of Onset  . Heart attack Mother   . Atrial fibrillation Mother   . Skin cancer Father   . Colon polyps Neg Hx   . Esophageal cancer Neg Hx   . Stomach cancer Neg Hx   . Rectal cancer Neg Hx    Social History   Socioeconomic History  . Marital status: Single    Spouse name: Not on file  . Number of children: Not on file  . Years of education: Not on file  . Highest education level: Not on file  Occupational History  . Not on file  Tobacco Use  . Smoking status: Current Every Day Smoker    Packs/day: 2.00    Years: 34.00    Pack years: 68.00    Types: Cigarettes    Start date: 02/12/1984  . Smokeless  tobacco: Former Systems developer    Quit date: 10/24/2012  . Tobacco comment: 1.5-2ppd  Quit attempt 12/24/2018  Vaping Use  . Vaping Use: Never used  Substance and Sexual Activity  . Alcohol use: Yes    Comment: socially  . Drug use: Not Currently    Types: "Crack" cocaine, Cocaine    Comment: last use 07/26/13  . Sexual activity: Never  Other Topics Concern  . Not on file  Social History Narrative  . Not on file   Social Determinants of Health   Financial Resource Strain:   . Difficulty of Paying Living Expenses: Not on file  Food Insecurity:   . Worried About Charity fundraiser in the Last Year: Not on file  . Ran Out of Food in the Last Year: Not on file  Transportation Needs:   . Lack of Transportation (Medical): Not on file  . Lack  of Transportation (Non-Medical): Not on file  Physical Activity:   . Days of Exercise per Week: Not on file  . Minutes of Exercise per Session: Not on file  Stress:   . Feeling of Stress : Not on file  Social Connections:   . Frequency of Communication with Friends and Family: Not on file  . Frequency of Social Gatherings with Friends and Family: Not on file  . Attends Religious Services: Not on file  . Active Member of Clubs or Organizations: Not on file  . Attends Archivist Meetings: Not on file  . Marital Status: Not on file   Past Surgical History:  Procedure Laterality Date  . APPENDECTOMY    . CARDIOVERSION  03/07/2006  . CARPAL TUNNEL RELEASE    . CERVICAL FUSION    . LAPAROSCOPIC APPENDECTOMY N/A 07/23/2017   Procedure: APPENDECTOMY LAPAROSCOPIC;  Surgeon: Georganna Skeans, MD;  Location: [place];  Service: General;  Laterality: N/A;  . TOTAL HIP ARTHROPLASTY Right 2016   Past Medical History:  Diagnosis Date  . A-fib ([place])   . Acid reflux   . Alcohol abuse   . Anxiety   . Asthma   . AVN (avascular necrosis of bone) ([place])   . Chronic back pain   . COPD (chronic obstructive pulmonary disease) ([place])   . Depression   . Hepatitis C    history of  . History of urinary frequency   . Hypertension   . Peripheral neuropathy    hands and feet  . Polysubstance abuse ([place]) 01/25/2011  . 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[place])   . 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[place])   . Seizures ([place])    There were no vitals taken for this visit.  Opioid Risk Score:   Fall Risk Score:  `1  Depression screen PHQ 2/9  Depression screen [facility] 2/9 10/05/2019 09/08/2019 08/24/2019 07/15/2019 06/02/2019 04/05/2019 02/25/2019  Decreased Interest 0 0 0 0 0 0 0  Down, Depressed, Hopeless 0 0 0 0 0 0 0  PHQ - 2 Score 0 0 0 0 0 0 0  Altered sleeping - 0 - - - - -  Tired, decreased energy - 0 - - - - -  Change in appetite - 0 - - - - -  Feeling bad or failure about yourself  - 0 - - - - -  Trouble concentrating - 0  - - - - -  Moving slowly or fidgety/restless - 0 - - - - -  Suicidal thoughts - 0 - - - - -  PHQ-9 Score - 0 - - - - -  Difficult doing work/chores - - - - - - -    Review of Systems  Neurological: Positive for weakness.       Tingling  All other systems reviewed and are negative.      Objective:   Physical Exam Gen: no distress, normal appearing HEENT: oral mucosa pink and moist, NCAT Cardio: Reg rate Chest: normal effort, normal rate of breathing Abd: soft, non-distended Ext: no edema Skin: intact Neuro: AOx3.  Musculoskeletal: Normal ambulation. +pain in knees with sit to stand. +patellar grind. No effusions. Tenderness to palpation of cervical spine and lateral masses with bilateral range of motion restriction. +left hip tenderness. Decreased sensation in both hands, worse on thumbs.  Psych: pleasant, normal affect     Assessment & Plan:  Blake Arnold is a 49 year old man who presents with chronic pain in multiple joints including his bilateral shoulders, knees, hips, and legs. He has been diagnosed with both rheumatoid arthritis and osteoarthritis. He is on methotrexate for his RA which he has not taken for the past 2 months.    1)Bilateral knee pain R>L: --XRs personally reviewed and discussed with patient--show no evidence of OA.  --Conitnue Diclofenac gel. Can increase from twice per day to QID application. This has been helping. I have provided a refill.    2) Cervical spine pain: 3) Left hip AVN: --Failed  lyrica, Cymbalta, Amitriptyline. Discussed cervical epidurals which he may consider in future; he would like to discuss with his spine surgeon first. --Start Gabapentin 300mg  TID   Morbid Obesity:  Explained that every pound of weight is 6 pounds on the knees. He remembered this fact. Furthermore, obesity results in inflammation which increases pain. He likes to eat carbs, red meats, nuts, and to drink cola. Advised to cut down on his consumption of cola (he has  done!) and carbs which are less nutritive and more inflammatory. Will continue to monitor his weight and weight reduction will be a large part of decreasing his pain. 10 lb weight loss in the past 5 months!   -Enjoying work! He gets to walk a lot there.      All questions were encouraged and answered. Follow up with me in 1 month.

## 2019-12-22 NOTE — Unmapped (Signed)
Addended by: Elam Dutch F on: 12/22/2019 11:12 AM     Modules accepted: Orders

## 2020-01-14 ENCOUNTER — Ambulatory Visit (INDEPENDENT_AMBULATORY_CARE_PROVIDER_SITE_OTHER): Payer: Medicaid Other | Admitting: Family Medicine

## 2020-01-14 ENCOUNTER — Other Ambulatory Visit: Payer: Self-pay

## 2020-01-14 ENCOUNTER — Encounter: Payer: Self-pay | Admitting: Family Medicine

## 2020-01-14 DIAGNOSIS — K047 Periapical abscess without sinus: Secondary | ICD-10-CM

## 2020-01-14 MED ORDER — AMOXICILLIN-POT CLAVULANATE 875-125 MG PO TABS: 1.0000 | ORAL_TABLET | Freq: Two times a day (BID) | ORAL | 0 refills | Status: AC

## 2020-01-14 NOTE — Progress Notes (Signed)
    SUBJECTIVE:   CHIEF COMPLAINT / HPI:   Tooth abscess: Patient is a 49 year old male that presents today for concern of a tooth abscess. Tooth has been bothering him for 5 days, felt like he had chills and subjective fevers last night. Has appointment with dentist on Tuesday but because of his history of hip surgery he was told by the dentist that he needed to be on antibiotics before she can pull his tooth or drain the abscess.  Patient states that he has no difficulty with breathing and feels that the swelling is mostly confined to one particular tooth on the left lower aspect of his jaw.  He states he has had multiple teeth pulled in the past from abscesses.  PERTINENT  PMH / PSH: Previous tooth abscesses  OBJECTIVE:   BP 140/78   Pulse 80   Ht 5\' 9"  (1.753 m)   Wt 240 lb (108.9 kg)   SpO2 96%   BMI 35.44 kg/m    General: NAD, pleasant, able to participate in exam HEENT: Some soft tissue swelling in the lower right aspect of the patient's buccal mucosa with a tooth on the lower aspect of the jaw which appears to have some areas of plaque and necrosis to it.  No swelling the pharyngeal area, no swelling in the lower aspect of the patient's jaw underneath his tongue, no concern for Ludewig's angina. Respiratory: No respiratory distress Psych: Normal affect and mood  ASSESSMENT/PLAN:   Tooth abscess Assessment: 49 year old male with a history of multiple tooth abscesses in the past with a tooth abscess on the right lower aspect of his jaw.  Patient already has an appointment with dentistry to have the tooth removed but requires antibiotics per dentistry due to these history of a hip surgery in the past.  Patient states he has had many abscesses in the past.  He did endorse having some fever subjectively last night with some associated chills but he did not check his temperature.  No concerns for Ludewig's angina, no pharyngeal swelling, patient's airway is patent but the patient does  have some buccal swelling in the right lower aspect of his mouth with an associated tooth which is likely the culprit. Plan: -Prescribed the patient Augmentin -Discussed return precautions including if the patient develops any ongoing fevers, difficulty breathing, or other concerning symptoms between now and the time that he has his dentist appointment    Lurline Del, Deputy    This note was prepared using Dragon voice recognition software and may include unintentional dictation errors due to the inherent limitations of voice recognition software.

## 2020-01-14 NOTE — Patient Instructions (Signed)
It was great to see you! Thank you for allowing me to participate in your care!  Our plans for today:  -Today we discussed your tooth abscess.  I am prescribing an antibiotic called Augmentin for you to take until you have the tooth removed.  I am giving you a 7-day course of this but your dentist may decide to stop it once the tooth is removed I think both options are reasonable. -If you develop any fevers, difficulty breathing, or other concerning symptoms do not hesitate to go to the emergency department -If we can do anything else for you in the future do not hesitate to reach out.  Take care and seek immediate care sooner if you develop any concerns.   Dr. Lurline Del, [place]

## 2020-01-14 NOTE — Assessment & Plan Note (Signed)
Assessment: 49 year old male with a history of multiple tooth abscesses in the past with a tooth abscess on the right lower aspect of his jaw.  Patient already has an appointment with dentistry to have the tooth removed but requires antibiotics per dentistry due to these history of a hip surgery in the past.  Patient states he has had many abscesses in the past.  He did endorse having some fever subjectively last night with some associated chills but he did not check his temperature.  No concerns for Ludewig's angina, no pharyngeal swelling, patient's airway is patent but the patient does have some buccal swelling in the right lower aspect of his mouth with an associated tooth which is likely the culprit. Plan: -Prescribed the patient Augmentin -Discussed return precautions including if the patient develops any ongoing fevers, difficulty breathing, or other concerning symptoms between now and the time that he has his dentist appointment

## 2020-01-28 ENCOUNTER — Ambulatory Visit: Payer: Medicaid Other | Admitting: Physical Medicine and Rehabilitation

## 2020-02-05 ENCOUNTER — Encounter (HOSPITAL_COMMUNITY): Payer: Self-pay

## 2020-02-05 ENCOUNTER — Other Ambulatory Visit: Payer: Self-pay

## 2020-02-05 ENCOUNTER — Emergency Department (HOSPITAL_COMMUNITY)
Admission: EM | Admit: 2020-02-05 | Discharge: 2020-02-05 | Disposition: A | Discharge status: Home / Self Care | Payer: Medicaid Other | Attending: Emergency Medicine | Admitting: Emergency Medicine

## 2020-02-05 DIAGNOSIS — J45909 Unspecified asthma, uncomplicated: Secondary | ICD-10-CM | POA: Insufficient documentation

## 2020-02-05 DIAGNOSIS — I10 Essential (primary) hypertension: Secondary | ICD-10-CM | POA: Insufficient documentation

## 2020-02-05 DIAGNOSIS — F17210 Nicotine dependence, cigarettes, uncomplicated: Secondary | ICD-10-CM | POA: Insufficient documentation

## 2020-02-05 DIAGNOSIS — J449 Chronic obstructive pulmonary disease, unspecified: Secondary | ICD-10-CM | POA: Diagnosis not present

## 2020-02-05 DIAGNOSIS — S30860A Insect bite (nonvenomous) of lower back and pelvis, initial encounter: Secondary | ICD-10-CM | POA: Insufficient documentation

## 2020-02-05 DIAGNOSIS — Z7982 Long term (current) use of aspirin: Secondary | ICD-10-CM | POA: Insufficient documentation

## 2020-02-05 DIAGNOSIS — W57XXXA Bitten or stung by nonvenomous insect and other nonvenomous arthropods, initial encounter: Secondary | ICD-10-CM | POA: Insufficient documentation

## 2020-02-05 DIAGNOSIS — Z79899 Other long term (current) drug therapy: Secondary | ICD-10-CM | POA: Diagnosis not present

## 2020-02-05 DIAGNOSIS — B029 Zoster without complications: Secondary | ICD-10-CM

## 2020-02-05 MED ORDER — VALACYCLOVIR HCL 1 G PO TABS: 1000.0000 mg | ORAL_TABLET | Freq: Three times a day (TID) | ORAL | 0 refills | Status: AC

## 2020-02-05 MED ORDER — VALACYCLOVIR HCL 500 MG PO TABS
1000.0000 mg | ORAL_TABLET | Freq: Once | ORAL | Status: AC
  Administered 2020-02-05: 11:00:00 1000 mg via ORAL
  Filled 2020-02-05: qty 2

## 2020-02-05 NOTE — ED Triage Notes (Signed)
Pt reports insect bite to his back that he noticed this morning, small blister with surrounding redness noted.

## 2020-02-05 NOTE — Discharge Instructions (Addendum)
You were evaluated in the emergency department today for your rash.  You were diagnosed with shingles, also known as herpes zoster. You have been prescribed a medication called Valtrex, you should take as prescribed for the entire course.  You can expect for this rash spread. The medication you have been prescribed should limit the eruption of your rash.   You may follow up with your primary care doctor.  Return the emergency department if you develop any chest pain, shortness of breath, palpitations, or other new severe symptoms.

## 2020-02-05 NOTE — ED Provider Notes (Signed)
[facility] EMERGENCY DEPARTMENT Provider Note   CSN: [id number] Arrival date & time: 02/05/20  8527     History Chief Complaint  Patient presents with  . Insect Bite    Blake Arnold is a 49 y.o. male who presents with concern for "spider bite on my back".  States that he woke this morning and while he was in the shower noticed a burning pain in his left lower back where he felt 2 small blisters as well.  He states that it burns when it is touched, and only on the blisters without pain around it.  Denies fevers or chills at home.  States that he has never had a rash like this before, however he has had spider bites which is what he thought this was.  Additionally endorses mild generalized abdominal pain this morning, states he feels it is because he did not eat anything today and he had several alcoholic drinks last night.  Denies nausea, vomiting, diarrhea, congestion, loss of taste or smell, headache, dizziness, syncope.  I personally reviewed this patient's medical records.  He has history of A. fib, polysubstance abuse, bipolar, hypertension, rheumatoid arthritis, hyperlipidemia, obstructive sleep apnea. He has not been vaccinated against shingles.   HPI     Past Medical History:  Diagnosis Date  . A-fib (HCC)   . Acid reflux   . Alcohol abuse   . Anxiety   . Asthma   . AVN (avascular necrosis of bone) (HCC)   . Chronic back pain   . COPD (chronic obstructive pulmonary disease) (HCC)   . Depression   . Hepatitis C    history of  . History of urinary frequency   . Hypertension   . Peripheral neuropathy    hands and feet  . Polysubstance abuse (HCC) 01/25/2011  . 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HCC)   . 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HCC)   . Seizures [facility])     Patient Active Problem List   Diagnosis Date Noted  . Tooth abscess 01/14/2020  . Tongue lesion 10/11/2019  . Rash 09/11/2019  . Carpal tunnel syndrome 08/24/2019  . Onychomycosis 08/24/2019  .  Cervicalgia 06/02/2019  . Left hip pain 04/06/2019  . Abdominal pain 02/28/2019  . Shoulder pain 12/17/2018  . Hyperlipidemia 09/01/2018  . HTN (hypertension) 07/24/2017  . Seizure (HCC) 07/24/2017  . RA (rheumatoid arthritis) (HCC) 07/24/2017  . Numbness in feet 03/04/2016  . Degenerative joint disease 12/28/2015  . OSA (obstructive sleep apnea) 11/23/2015  . 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 Alcohol abuse with alcohol-induced mood disorder (HCC) 07/26/2013  . Bipolar disorder, unspecified (HCC) 10/28/2012  . Alcohol dependence (HCC) 04/02/2012  . Alcohol withdrawal (HCC) 04/02/2012  . Major depression 04/02/2012  . Legal problem 04/22/2011  . Polysubstance abuse (HCC) 01/25/2011  . Alcohol abuse 01/22/2011  . Chest pain 01/22/2011  . TOBACCO ABUSE 02/09/2010  . SUBSTANCE ABUSE 02/09/2010  . Tobacco dependence syndrome 02/09/2010  . FIBRILLATION, ATRIAL 06/08/2009    Past Surgical History:  Procedure Laterality Date  . APPENDECTOMY    . CARDIOVERSION  03/07/2006  . CARPAL TUNNEL RELEASE    . CERVICAL FUSION    . LAPAROSCOPIC APPENDECTOMY N/A 07/23/2017   Procedure: APPENDECTOMY LAPAROSCOPIC;  Surgeon: Violeta Gelinas, MD;  Location: [facility] OR;  Service: General;  Laterality: N/A;  . TOTAL HIP ARTHROPLASTY Right 2016       Family History  Problem Relation Age of Onset  . Heart attack Mother   . Atrial  fibrillation Mother   . Skin cancer Father   . Colon polyps Neg Hx   . Esophageal cancer Neg Hx   . Stomach cancer Neg Hx   . Rectal cancer Neg Hx     Social History   Tobacco Use  . Smoking status: Current Every Day Smoker    Packs/day: 2.00    Years: 34.00    Pack years: 68.00    Types: Cigarettes    Start date: 02/12/1984  . Smokeless tobacco: Former Systems developer    Quit date: 10/24/2012  . Tobacco comment: 1.5-2ppd  Quit attempt 12/24/2018  Vaping Use  . Vaping Use: Never used  Substance Use Topics  . Alcohol use: Yes    Comment: socially  .  Drug use: Not Currently    Types: "Crack" cocaine, Cocaine    Comment: last use 07/26/13    Home Medications Prior to Admission medications   Medication Sig Start Date End Date Taking? Authorizing Provider  Adalimumab (HUMIRA) 40 MG/0.8ML PSKT Inject 0.8 mLs (40 mg total) into the skin once a week. tuesdays Patient taking differently: Inject 0.8 mLs into the skin once a week. Sunday 08/05/17 04/06/19  Saverio Danker, PA-C  Albuterol Sulfate (PROAIR RESPICLICK) 123XX123 (90 Base) MCG/ACT AEPB Inhale 2 puffs into the lungs every 4 (four) hours as needed (wheezing; cough). 06/21/19   [name], DO  amitriptyline (ELAVIL) 10 MG tablet Take 1 tablet (10 mg total) by mouth at bedtime. 06/22/19   Raulkar, Clide Deutscher, MD  amLODipine (NORVASC) 5 MG tablet TAKE 1 TABLET(5 MG) BY MOUTH AT BEDTIME 12/15/19   [name], DO  aspirin EC 81 MG tablet Take 1 tablet (81 mg total) by mouth daily. 07/15/19   [name], DO  diclofenac Sodium (VOLTAREN) 1 % GEL Apply 1 application topically 3 (three) times daily. 12/22/19   Raulkar, Clide Deutscher, MD  famotidine (PEPCID) 10 MG tablet Take 1 tablet (10 mg total) by mouth 2 (two) times daily. 06/02/19   Bonnita Hollow, MD  FOLIC ACID PO Take by mouth.    [provider]  gabapentin (NEURONTIN) 300 MG capsule Take 1 capsule (300 mg total) by mouth 3 (three) times daily. 12/22/19   Raulkar, Clide Deutscher, MD  hydrochlorothiazide (HYDRODIURIL) 25 MG tablet TAKE 1 TABLET(25 MG) BY MOUTH DAILY 08/31/19   [name], DO  ibuprofen (ADVIL) 800 MG tablet Take 800 mg by mouth every 6 (six) hours. 04/14/19   [provider]  Methotrexate, Anti-Rheumatic, (METHOTREXATE, PF, ) Inject 1 mL into the skin once a week.  Patient not taking: Reported on 01/14/2020    [provider]  metoprolol tartrate (LOPRESSOR) 50 MG tablet TAKE 1 TABLET(50 MG) BY MOUTH TWICE DAILY 11/11/19   [name], DO  neomycin-polymyxin-hydrocortisone  (CORTISPORIN) OTIC solution Apply 1-2 drops to toe after soaking twice a day Patient not taking: Reported on 01/14/2020 10/07/19   Wallene Huh, DPM  pantoprazole (PROTONIX) 40 MG tablet Take 1 tablet (40 mg total) by mouth daily. Patient not taking: Reported on 01/14/2020 02/25/19   Carollee Leitz, MD  simvastatin (ZOCOR) 40 MG tablet TAKE 1 TABLET(40 MG) BY MOUTH DAILY 12/15/19   [name], DO  terbinafine (LAMISIL) 250 MG tablet Take 1 tablet (250 mg total) by mouth daily. Patient not taking: Reported on 01/14/2020 08/24/19   Matilde Haymaker, MD  triamcinolone ointment (KENALOG) 0.5 % Apply 1 application topically 2 (two) times daily. Patient not taking: Reported on 01/14/2020 09/08/19   Delora Fuel, MD  Allergies    Lisinopril and Varenicline  Review of Systems   Review of Systems  Constitutional: Negative for activity change, appetite change, chills, diaphoresis, fatigue and fever.  HENT: Negative.   Respiratory: Negative.   Cardiovascular: Negative.   Gastrointestinal: Positive for abdominal pain.  Musculoskeletal: Negative.   Skin: Positive for rash.       Left lower back  Neurological: Negative for dizziness, syncope, facial asymmetry, weakness, light-headedness, numbness and headaches.  Hematological: Negative.     Physical Exam Updated Vital Signs BP (!) 150/97   Pulse 87   Temp 98 F (36.7 C) (Oral)   Resp 18   SpO2 94%   Physical Exam Vitals and nursing note reviewed.  Constitutional:      Appearance: He is obese.  HENT:     Head: Normocephalic and atraumatic.     Right Ear: External ear normal.     Left Ear: External ear normal.     Nose: Nose normal.     Mouth/Throat:     Mouth: Mucous membranes are moist.     Pharynx: Oropharynx is clear. Uvula midline. No oropharyngeal exudate, posterior oropharyngeal erythema or uvula swelling.     Tonsils: No tonsillar exudate.  Eyes:     General:        Right eye: No discharge.        Left eye: No discharge.      Extraocular Movements: Extraocular movements intact.     Conjunctiva/sclera: Conjunctivae normal.     Pupils: Pupils are equal, round, and reactive to light.  Neck:     Trachea: Trachea and phonation normal.  Cardiovascular:     Rate and Rhythm: Normal rate and regular rhythm.     Pulses: Normal pulses.          Radial pulses are 2+ on the right side and 2+ on the left side.     Heart sounds: Normal heart sounds. No murmur heard.   Pulmonary:     Effort: Pulmonary effort is normal. No respiratory distress.     Breath sounds: Normal breath sounds. No wheezing or rales.  Chest:     Chest wall: No lacerations, deformity, swelling, tenderness, crepitus or edema.  Abdominal:     General: Bowel sounds are normal. There is no distension.     Palpations: Abdomen is soft.     Tenderness: There is no abdominal tenderness. There is no guarding or rebound.  Musculoskeletal:        General: No deformity.     Cervical back: Full passive range of motion without pain and neck supple. No rigidity, tenderness or crepitus. No pain with movement, spinous process tenderness or muscular tenderness.  Lymphadenopathy:     Cervical: No cervical adenopathy.  Skin:    General: Skin is warm and dry.     Capillary Refill: Capillary refill takes less than 2 seconds.     Findings: Rash present. Rash is vesicular.       Neurological:     General: No focal deficit present.     Mental Status: He is alert and oriented to person, place, and time. Mental status is at baseline.     Sensory: Sensation is intact.     Motor: Motor function is intact.     Gait: Gait is intact.  Psychiatric:        Mood and Affect: Mood normal.       ED Results / Procedures / Treatments   Labs (all labs ordered are  listed, but only abnormal results are displayed) Labs Reviewed - No data to display  EKG None  Radiology No results found.  Procedures Procedures (including critical care time)  Medications Ordered in  ED Medications - No data to display  ED Course  I have reviewed the triage vital signs and the nursing notes.  Pertinent labs & imaging results that were available during my care of the patient were reviewed by me and considered in my medical decision making (see chart for details).    MDM Rules/Calculators/A&P                         49 year old male who presents with concern for 2 small blisters and burning pain in his left back that started this morning. Has not been vaccinated against shingles.  Differential diagnosis for this patient symptoms includes but is not limited to herpes zoster, contact dermatitis, dyshidrotic eczema, burn, poison oak/ivy/sumac dermatitis, bullous impetigo, folliculitis.    Hypertensive on intake to 150/97. Vital signs otherwise normal on intake.  Physical exam as above, concerning for herpes zoster. Patient evaluated at the bedside by attending physician Dr. Ralene Bathe, who agrees with diagnosis of herpes zoster at this time.  Will administer first dose of Valtrex in the emergency department.  Will discharge with course of Valtrex at home, shingles education. He was informed he should pick up prescription of Valtrex today, should take it in timely manner and for the entire course.  No further work-up is warranted in the emergency department at this time.  Blake Arnold voiced understanding of his medical evaluation and treatment plan. Each of his questions were answered to his expressed satisfaction. Infection precautions were given. Return precautions given. Patient is well-appearing, stable, and appropriate for discharge at this time.  Final Clinical Impression(s) / ED Diagnoses Final diagnoses:  None    Rx / DC Orders ED Discharge Orders    None       Aura Dials 02/05/20 1128    Quintella Reichert, MD 02/06/20 [id number]

## 2020-02-25 ENCOUNTER — Ambulatory Visit: Payer: Medicaid Other | Admitting: Physical Medicine and Rehabilitation

## 2020-03-16 ENCOUNTER — Encounter: Payer: Self-pay | Admitting: Physical Medicine and Rehabilitation

## 2020-03-16 ENCOUNTER — Other Ambulatory Visit: Payer: Self-pay

## 2020-03-16 ENCOUNTER — Encounter
Payer: Medicaid Other | Attending: Physical Medicine and Rehabilitation | Admitting: Physical Medicine and Rehabilitation

## 2020-03-16 DIAGNOSIS — M4802 Spinal stenosis, cervical region: Secondary | ICD-10-CM | POA: Insufficient documentation

## 2020-03-16 DIAGNOSIS — M542 Cervicalgia: Secondary | ICD-10-CM | POA: Insufficient documentation

## 2020-03-16 MED ORDER — LIDOCAINE 5 % EX PTCH: 1.0000 | MEDICATED_PATCH | CUTANEOUS | 3 refills | Status: DC

## 2020-03-16 MED ORDER — GABAPENTIN 400 MG PO CAPS: 400.0000 mg | ORAL_CAPSULE | Freq: Three times a day (TID) | ORAL | 3 refills | Status: DC

## 2020-03-16 NOTE — Patient Instructions (Addendum)
Blue emu oil  -Discussed following foods that may reduce pain: 1) Ginger 2) Blueberries 3) Salmon 4) Pumpkin seeds 5) dark chocolate 6) turmeric 7) tart cherries 8) virgin olive oil 9) chilli peppers 10) mint  Link to further information on diet for chronic pain: http://www.randall.com/   Turmeric to reduce inflammation--can be used in cooking or taken as a supplement.  Benefits of turmeric:  -Highly anti-inflammatory  -Increases antioxidants  -Improves memory, attention, brain disease  -Lowers risk of heart disease  -May help prevent cancer  -Decreases pain  -Alleviates depression  -Delays aging and decreases risk of chronic disease  -Consume with black pepper to increase absorption    Turmeric Milk Recipe:  1 cup milk  1 tsp turmeric  1 tsp cinnamon  1 tsp grated ginger (optional)  Black pepper (boosts the anti-inflammatory properties of turmeric).  1 tsp honey

## 2020-03-16 NOTE — Progress Notes (Signed)
Subjective:    Patient ID: Blake Arnold, male    DOB: 03/22/1970, 50 y.o.   MRN: [id number]  HPI  Blake Arnold is a 50 year old man who presents for follow-up of cervical facet arthropathy.  1) Cervical facet arthropathy:   -The burning in his arms is now gone.   -He continues to have pain at C7 with no surrounding tenderness.   -He asks whether he can try a higher dose of Gabapentin. He is currently taking Gabapentin 300mg  TID. It does not make him sleepy.   2) Hip pain has improved.   3) Obesity: Steadily losing weight! He has lost another 7 lbs in the past 2 months!  -He walks a lot as part of his work.  4) Vocation: He loves his job. He is a Clinical cytogeneticist. He has been wanting to go back to work for a long time.   5) Diet: He has tried ginger and would be interested in alit of pain relieving foods.   -He has stopped all soft drinks.    Prior history: Blake Arnold is a 50 year old man who presents for follow-up of chronic pain in multiple joints including his bilateral shoulders, knees, hips, and legs. He has been diagnosed with both rheumatoid arthritis and osteoarthritis. He is on methotrexate for his RA but has not taken this in the past 2 months.   His worst pain is currently his right knee pain. His pain has been present for 30 years but the knee pain has been aggravating him recently. He has tried Mobic, Ibuprofen, Tylenol, Tramadol, Roxicodone, Vicodin. He has been on 4000mg  of Gabapentin without any benefit. I prescribed Lyrica, Cymbalta, and Amitriptyline previously and he did not tolerate these well. 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 I repeated XR of his knees previsouly, personally reviewed and discussed with patient, and they show no evidence of OA. Knee symptoms are worse when ascending and descending stairs and after sitting for prolonged periods. Discussed the importance of weight loss last visit and he has managed  to lose 10 lbs in the past month! He started working but this has exacerbated his pain and he does not feel that he can continue it. He remains quite active and is on his feet most of the day. The worst pain currently is cervical pain that radiates into his arms occasionally, worse on the left side. He does not want any narcotic medication as he has a history of addiction.   Pain Inventory Average Pain 6 Pain Right Now 8 My pain is sharp, burning, stabbing, tingling and aching  In the last 24 hours, has pain interfered with the following? General activity 6 Relation with others 6 Enjoyment of life 6 What TIME of day is your pain at its worst? all Sleep (in general) Fair  Pain is worse with: walking, bending, sitting and standing Pain improves with: therapy/exercise and pacing activities Relief from Meds: 4     Family History  Problem Relation Age of Onset  . Heart attack Mother   . Atrial fibrillation Mother   . Skin cancer Father   . Colon polyps Neg Hx   . Esophageal cancer Neg Hx   . Stomach cancer Neg Hx   . Rectal cancer Neg Hx    Social History   Socioeconomic History  . Marital status: Single    Spouse name: Not on file  . Number of children: Not on file  . Years  of education: Not on file  . Highest education level: Not on file  Occupational History  . Not on file  Tobacco Use  . Smoking status: Current Every Day Smoker    Packs/day: 2.00    Years: 34.00    Pack years: 68.00    Types: Cigarettes    Start date: 02/12/1984  . Smokeless tobacco: Former Systems developer    Quit date: 10/24/2012  . Tobacco comment: 1.5-2ppd  Quit attempt 12/24/2018  Vaping Use  . Vaping Use: Never used  Substance and Sexual Activity  . Alcohol use: Yes    Comment: socially  . Drug use: Not Currently    Types: "Crack" cocaine, Cocaine    Comment: last use 07/26/13  . Sexual activity: Never  Other Topics Concern  . Not on file  Social History Narrative  . Not on file   Social  Determinants of Health   Financial Resource Strain: Not on file  Food Insecurity: Not on file  Transportation Needs: Not on file  Physical Activity: Not on file  Stress: Not on file  Social Connections: Not on file   Past Surgical History:  Procedure Laterality Date  . APPENDECTOMY    . CARDIOVERSION  03/07/2006  . CARPAL TUNNEL RELEASE    . CERVICAL FUSION    . LAPAROSCOPIC APPENDECTOMY N/A 07/23/2017   Procedure: APPENDECTOMY LAPAROSCOPIC;  Surgeon: Georganna Skeans, MD;  Location: [facility];  Service: General;  Laterality: N/A;  . TOTAL HIP ARTHROPLASTY Right 2016   Past Medical History:  Diagnosis Date  . A-fib ([place])   . Acid reflux   . Alcohol abuse   . Anxiety   . Asthma   . AVN (avascular necrosis of bone) ([place])   . Chronic back pain   . COPD (chronic obstructive pulmonary disease) ([place])   . Depression   . Hepatitis C    history of  . History of urinary frequency   . Hypertension   . Peripheral neuropathy    hands and feet  . Polysubstance abuse ([place]) 01/25/2011  . 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[place])   . 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[place])   . Seizures ([place])    There were no vitals taken for this visit.  Opioid Risk Score:   Fall Risk Score:  `1  Depression screen PHQ 2/9  Depression screen [facility] 2/9 01/14/2020 10/05/2019 09/08/2019 08/24/2019 07/15/2019 06/02/2019 04/05/2019  Decreased Interest 0 0 0 0 0 0 0  Down, Depressed, Hopeless 0 0 0 0 0 0 0  PHQ - 2 Score 0 0 0 0 0 0 0  Altered sleeping 2 - 0 - - - -  Tired, decreased energy 2 - 0 - - - -  Change in appetite 2 - 0 - - - -  Feeling bad or failure about yourself  0 - 0 - - - -  Trouble concentrating 0 - 0 - - - -  Moving slowly or fidgety/restless 0 - 0 - - - -  Suicidal thoughts 0 - 0 - - - -  PHQ-9 Score 6 - 0 - - - -  Difficult doing work/chores Very difficult - - - - - -    Review of Systems  Neurological: Positive for weakness.       Tingling  All other systems reviewed and are negative.      Objective:    Physical Exam Gen: no distress, normal appearing HEENT: oral mucosa pink and moist, NCAT Cardio: Reg rate Chest: normal effort, normal rate of breathing Abd:  soft, non-distended Ext: no edema Psych: pleasant, normal affect Skin: intact Neuro: AOx3.  Musculoskeletal: Normal ambulation. +pain in knees with sit to stand. +patellar grind. No effusions. Tenderness to palpation of cervical spine and lateral masses with bilateral range of motion restriction. +left hip tenderness. Decreased sensation in both hands, worse on thumbs.  Psych: pleasant, normal affect    Assessment & Plan:  Mr. Tobler is a 50 year old man who presents with chronic pain in multiple joints including his bilateral shoulders, knees, hips, and legs, as well as cervical stenosis. He has been diagnosed with both rheumatoid arthritis and osteoarthritis. He is on methotrexate for his RA which he has not taken for the past 2 months.    1)Bilateral knee pain R>L: --XRs personally reviewed and discussed with patient--show no evidence of OA.  --Continue Diclofenac gel. Can increase from twice per day to QID application. This has been helping. I have provided a refill.    2) Cervical spine pain: -No longer with radiating symptoms -Physical therapy made his pain worse in the past -Advised to avoid too much neck extension, which can aggravate stenosis.  -Apply blue emu oil  -Discussed current symptoms of pain and history of pain.  -Discussed benefits of exercise in reducing pain. -Discussed following foods that may reduce pain: 1) Ginger 2) Blueberries 3) Salmon 4) Pumpkin seeds 5) dark chocolate 6) turmeric 7) tart cherries 8) virgin olive oil 9) chilli peppers 10) mint  Link to further information on diet for chronic pain: http://www.randall.com/  Turmeric to reduce inflammation--can be used in cooking or taken as a supplement.  Benefits of  turmeric:  -Highly anti-inflammatory  -Increases antioxidants  -Improves memory, attention, brain disease  -Lowers risk of heart disease  -May help prevent cancer  -Decreases pain  -Alleviates depression  -Delays aging and decreases risk of chronic disease  -Consume with black pepper to increase absorption    Turmeric Milk Recipe:  1 cup milk  1 tsp turmeric  1 tsp cinnamon  1 tsp grated ginger (optional)  Black pepper (boosts the anti-inflammatory properties of turmeric).  1 tsp honey   3) Left hip AVN: --Failed  lyrica, Cymbalta, Amitriptyline. Discussed cervical epidurals which he may consider in future; he would like to discuss with his spine surgeon first. --Increase Gabapentin to 400mg  TID   3) Morbid Obesity:  Explained that every pound of weight is 6 pounds on the knees. He remembered this fact. Furthermore, obesity results in inflammation which increases pain. He likes to eat carbs, red meats, nuts, and to drink cola. Advised to cut down on his consumption of cola (he has done!) and carbs which are less nutritive and more inflammatory. Will continue to monitor his weight and weight reduction will be a large part of decreasing his pain. Commended on loss of 17 lbs in the last 8 months! Comended on stopping coca-cola. Continue walking a lot at work.   -Enjoying work! He gets to walk a lot there.      All questions were encouraged and answered. Follow up with me in 1 month.

## 2020-04-11 ENCOUNTER — Other Ambulatory Visit: Payer: Self-pay | Admitting: Family Medicine

## 2020-04-16 ENCOUNTER — Other Ambulatory Visit: Payer: Self-pay | Admitting: Family Medicine

## 2020-04-17 IMAGING — DX DG KNEE STANDING AP BILAT
1 series · 1 of 1 positions shown · non-contrast
Comparison: None.

CLINICAL DATA: Bilateral knee pain.

EXAM:
BILATERAL KNEES STANDING - 1 VIEW

[w knees ap bilat]
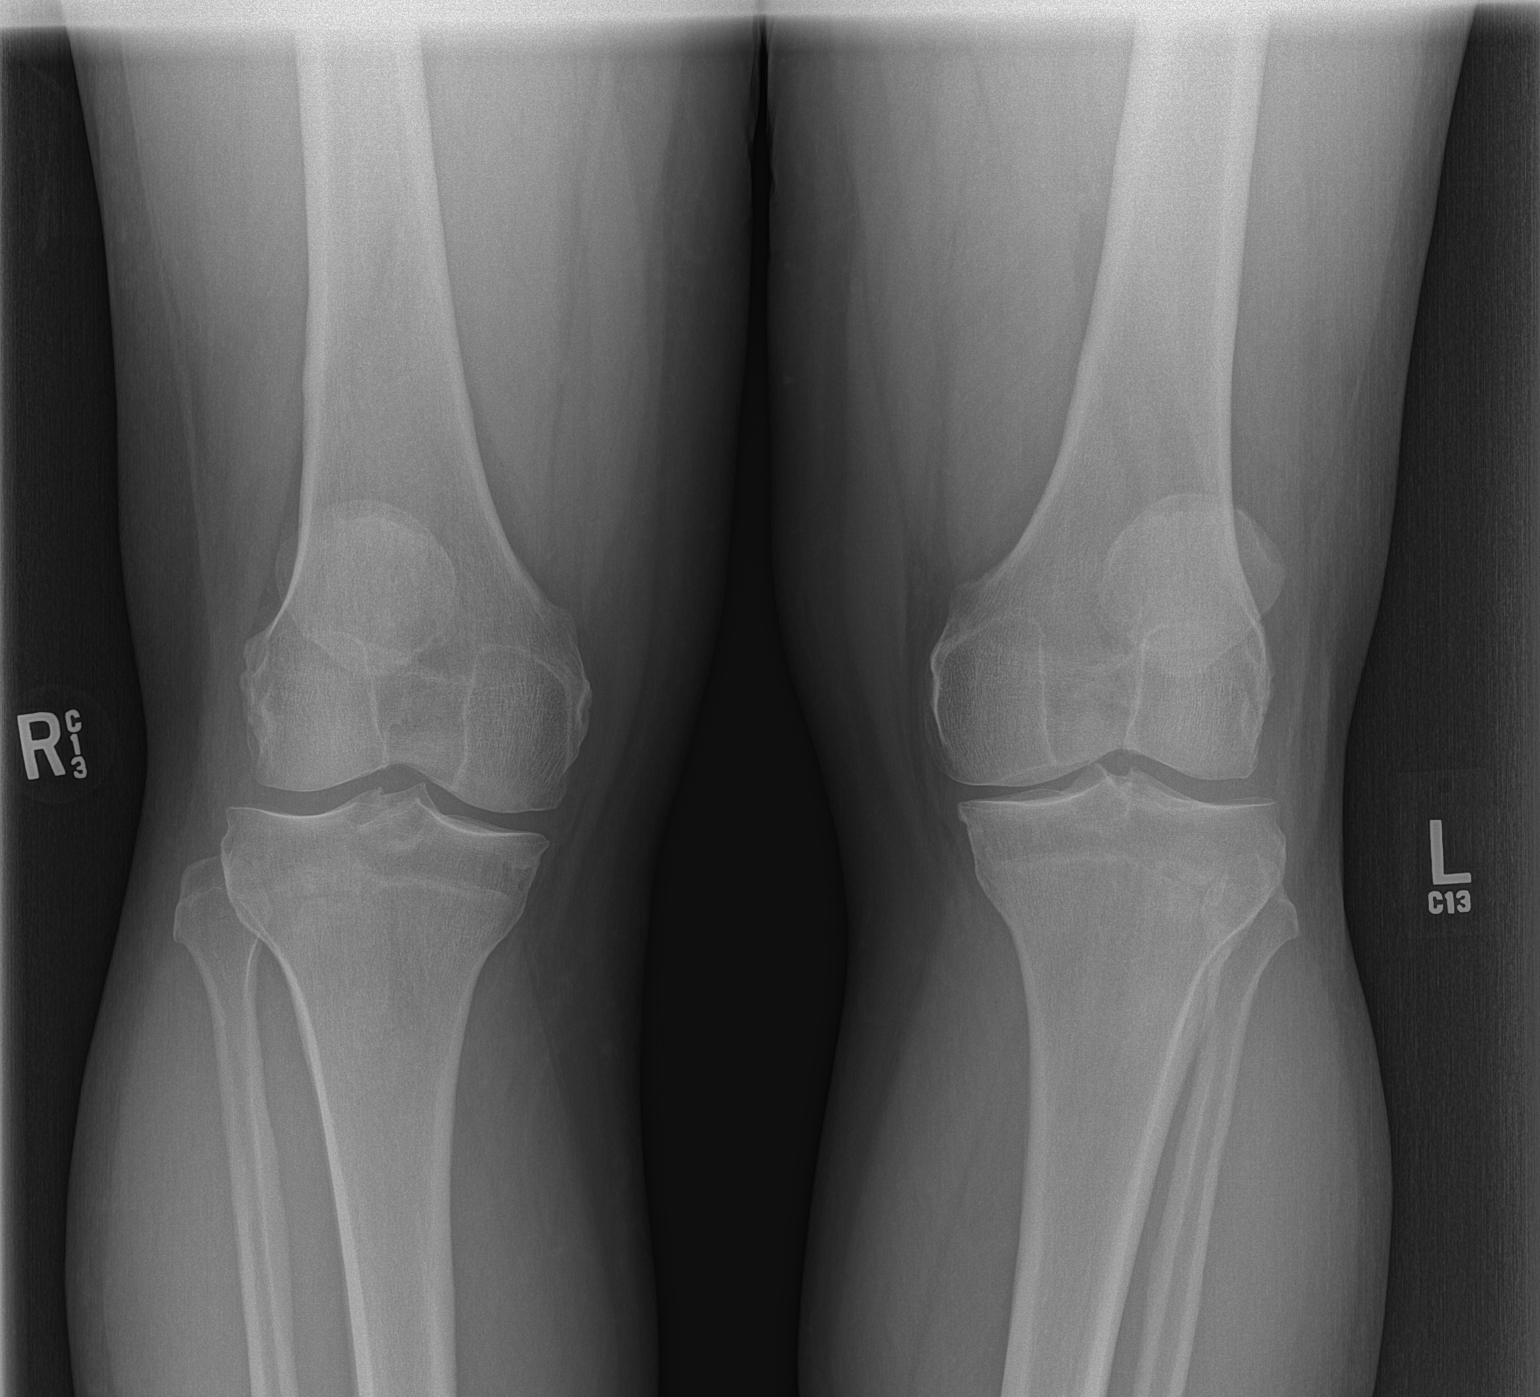

[1 of 1 positions shown; findings below may reference images not displayed]

FINDINGS: No evidence of fracture, dislocation, or joint effusion. No evidence
of arthropathy or other focal bone abnormality. Soft tissues are
unremarkable.
IMPRESSION: Negative.

## 2020-05-15 NOTE — Unmapped (Signed)
[facility] Clinical Assessment & Refill Coordination Note    Joseph Sandoval, DOB: 12-24-1970  Phone: [phone] (home)     All above HIPAA information was verified with patient.     Was a Nurse, learning disability used for this call? No    Specialty Medication(s):   Inflammatory Disorders: Humira     Current Outpatient Medications   Medication Sig Dispense Refill   ??? acetaminophen (TYLENOL) 500 MG tablet Take 1,000 mg by mouth every six (6) hours as needed for pain.     ??? albuterol HFA 90 mcg/actuation inhaler INHALE 2 PUFFS EVERY SIX (6) HOURS AS NEEDED FOR WHEEZING. 20.1 g 6   ??? albuterol HFA 90 mcg/actuation inhaler INHALE 2 PUFFS EVERY SIX (6) HOURS AS NEEDED FOR WHEEZING. 20.1 g 3   ??? amLODIPine (NORVASC) 5 MG tablet Take 10 mg by mouth daily.      ??? baclofen (LIORESAL) 10 MG tablet Take 10 mg by mouth Three (3) times a day.     ??? benztropine (COGENTIN) 0.5 MG tablet Take 0.5 mg by mouth Two (2) times a day.     ??? buPROPion (WELLBUTRIN XL) 150 MG 24 hr tablet Take 150 mg by mouth daily.     ??? DULoxetine (CYMBALTA) 20 MG capsule Take 1 capsule (20 mg total) by mouth nightly. 30 capsule 2   ??? empty container Misc Use as directed 1 each PRN   ??? HUMIRA PEN CITRATE FREE 40 MG/0.4 ML Inject the contents of 1 pen (40mg ) under the skin every 7 days. 12 each 2   ??? hydroCHLOROthiazide (HYDRODIURIL) 25 MG tablet Take 25 mg by mouth daily.  4   ??? metoprolol tartrate (LOPRESSOR) 25 MG tablet Take 50 mg by mouth Two (2) times a day.      ??? mometasone-formoterol 200-5 mcg/actuation HFAA INHALE 2 PUFFS TWO (2) TIMES A DAY. 39 g 6   ??? mometasone-formoterol 200-5 mcg/actuation HFAA INHALE 2 PUFFS TWO (2) TIMES A DAY. 39 g 3   ??? ranitidine (ZANTAC) 150 MG tablet Take 150 mg by mouth Two (2) times a day. (Patient not taking: Reported on 06/08/2019)     ??? risperiDONE (RISPERDAL) 2 MG tablet Take 2 mg by mouth nightly.     ??? sertraline (ZOLOFT) 50 MG tablet Take 100 mg by mouth daily.  (Patient not taking: Reported on 06/08/2019)  1   ??? simvastatin (ZOCOR) 20 MG tablet Take 40 mg by mouth nightly.      ??? syringe with needle 1 mL 25 gauge x 5/8 Syrg Use as directed to inject methotrexate 12 each PRN     No current facility-administered medications for this visit.        Changes to medications: Joseph Sandoval reports no changes at this time.    Allergies   Allergen Reactions   ??? Lisinopril Swelling     swollen lips   ??? Varenicline Dermatitis and Rash     Other reaction(s): Other (See Comments)  Described as an acne like rash on his face  Also noted significant GI intolerance       Changes to allergies: No    SPECIALTY MEDICATION ADHERENCE     Humira 40/0.4 mg/ml: +21 days of medicine on hand       Medication Adherence    Patient reported X missed doses in the last month: >5  Specialty Medication: Humira 40 mg/0.4 ml  Patient is on additional specialty medications: No  Informant: patient  Reasons for  non-adherence: patient forgets  Adherence tools used: calendar  Confirmed plan for next specialty medication refill: delivery by pharmacy  Refills needed for supportive medications: not needed        Specialty medication(s) dose(s) confirmed: Regimen is correct and unchanged.     Are there any concerns with adherence? Yes: Patient admits to missing 4-5 doses of Humira.  He is congnicent of the important of adherence and has multiple methods of helping remember.  He is having a flare right now with pain in shoulders and back.  I encouraged him to take a dose today for pain relief but he is aware it will take a few doses for full relief.    Adherence counseling provided? Yes: I reiterated the importance of being adherent in order for the drug to work properly.  I will set another clinical next month and let CPP & MD know.    CLINICAL MANAGEMENT AND INTERVENTION      Clinical Benefit Assessment:    Do you feel the medicine is effective or helping your condition? Yes    Clinical Benefit counseling provided? Not needed    Adverse Effects Assessment:    Are you experiencing any side effects? No    Are you experiencing difficulty administering your medicine? No    Quality of Life Assessment:    How many days over the past month did your RA  keep you from your normal activities? For example, brushing your teeth or getting up in the morning. 0    Have you discussed this with your provider? Not needed    Acute Infection Status:    Acute infections noted within Epic:  No active infections  Patient reported infection: None    Therapy Appropriateness:    Is therapy appropriate? Yes, therapy is appropriate and should be continued    DISEASE/MEDICATION-SPECIFIC INFORMATION      For patients on injectable medications: Patient currently has 5 doses left.  Next injection is scheduled for 05/15/20.    PATIENT SPECIFIC NEEDS     - Does the patient have any physical, cognitive, or cultural barriers? No    - Is the patient high risk? No    - Does the patient require a Care Management Plan? No     - Does the patient require physician intervention or other additional services (i.e. nutrition, smoking cessation, social work)? No      SHIPPING     Specialty Medication(s) to be Shipped:   Inflammatory Disorders: n/a    Other medication(s) to be shipped: No additional medications requested for fill at this time     Changes to insurance: No    Delivery Scheduled: Patient declined refill at this time due to has 5 weeks of meds left.     Medication will be delivered via n/a to the confirmed prescription address in [facility].    The patient will receive a drug information handout for each medication shipped and additional FDA Medication Guides as required.  Verified that patient has previously received a Conservation officer, historic buildings and a Surveyor, mining.    All of the patient's questions and concerns have been addressed.    Joseph Sandoval Vangie Bicker   [facility] Specialty Pharmacist

## 2020-05-18 NOTE — Unmapped (Signed)
Messaged to get patient rescheduled in rheumatology.  Called patient with plan to continue [facility] follow-up.  He is overall feeling stable.

## 2020-06-07 NOTE — Unmapped (Signed)
[facility] Clinical Assessment & Refill Coordination Note    Joseph Sandoval, DOB: [birth year]  Phone: [phone] (home)     All above HIPAA information was verified with patient.     Was a Nurse, learning disability used for this call? No    Specialty Medication(s):   Inflammatory Disorders: Humira     Current Outpatient Medications   Medication Sig Dispense Refill   ??? acetaminophen (TYLENOL) 500 MG tablet Take 1,000 mg by mouth every six (6) hours as needed for pain.     ??? albuterol HFA 90 mcg/actuation inhaler INHALE 2 PUFFS EVERY SIX (6) HOURS AS NEEDED FOR WHEEZING. 20.1 g 6   ??? albuterol HFA 90 mcg/actuation inhaler INHALE 2 PUFFS EVERY SIX (6) HOURS AS NEEDED FOR WHEEZING. 20.1 g 3   ??? amLODIPine (NORVASC) 5 MG tablet Take 10 mg by mouth daily.      ??? baclofen (LIORESAL) 10 MG tablet Take 10 mg by mouth Three (3) times a day.     ??? benztropine (COGENTIN) 0.5 MG tablet Take 0.5 mg by mouth Two (2) times a day.     ??? buPROPion (WELLBUTRIN XL) 150 MG 24 hr tablet Take 150 mg by mouth daily.     ??? DULoxetine (CYMBALTA) 20 MG capsule Take 1 capsule (20 mg total) by mouth nightly. 30 capsule 2   ??? empty container Misc Use as directed 1 each PRN   ??? HUMIRA PEN CITRATE FREE 40 MG/0.4 ML Inject the contents of 1 pen (40mg ) under the skin every 7 days. 12 each 2   ??? hydroCHLOROthiazide (HYDRODIURIL) 25 MG tablet Take 25 mg by mouth daily.  4   ??? metoprolol tartrate (LOPRESSOR) 25 MG tablet Take 50 mg by mouth Two (2) times a day.      ??? mometasone-formoterol 200-5 mcg/actuation HFAA INHALE 2 PUFFS TWO (2) TIMES A DAY. 39 g 6   ??? mometasone-formoterol 200-5 mcg/actuation HFAA INHALE 2 PUFFS TWO (2) TIMES A DAY. 39 g 3   ??? ranitidine (ZANTAC) 150 MG tablet Take 150 mg by mouth Two (2) times a day. (Patient not taking: Reported on 06/08/2019)     ??? risperiDONE (RISPERDAL) 2 MG tablet Take 2 mg by mouth nightly.     ??? sertraline (ZOLOFT) 50 MG tablet Take 100 mg by mouth daily.  (Patient not taking: Reported on 06/08/2019)  1   ??? simvastatin (ZOCOR) 20 MG tablet Take 40 mg by mouth nightly.      ??? syringe with needle 1 mL 25 gauge x 5/8 Syrg Use as directed to inject methotrexate 12 each PRN     No current facility-administered medications for this visit.        Changes to medications: [name] reports no changes at this time.    Allergies   Allergen Reactions   ??? Lisinopril Swelling     swollen lips   ??? Varenicline Dermatitis and Rash     Other reaction(s): Other (See Comments)  Described as an acne like rash on his face  Also noted significant GI intolerance       Changes to allergies: No    SPECIALTY MEDICATION ADHERENCE     Humira 40 mg/0.3ml: 11 days of medicine on hand       Medication Adherence    Patient reported X missed doses in the last month: 0  Specialty Medication: Humira  Adherence tools used: calendar          Specialty medication(s) dose(s)  confirmed: Regimen is correct and unchanged.     Are there any concerns with adherence? No - patient reports that his adherence has been much better over the last month. He transitioned his injections to Mondays and has not missed a dose.    Adherence counseling provided? Not needed    CLINICAL MANAGEMENT AND INTERVENTION      Clinical Benefit Assessment:    Do you feel the medicine is effective or helping your condition? Yes    Clinical Benefit counseling provided? Not needed    Adverse Effects Assessment:    Are you experiencing any side effects? No    Are you experiencing difficulty administering your medicine? No    Quality of Life Assessment:    Rheumatology:   Quality of Life    On a scale of 1 ??? 10 with 1 representing not at all and 10 representing completely ??? how has your rheumatologic condition affected your:  Daily pain level?: 10  Ability to complete your regular daily tasks (prepare meals, get dressed, etc.)?: 1  Ability to participate in social or family activities?: 10         Have you discussed this with your provider? No, but patient will discuss with her at next appointment    Acute Infection Status:    Acute infections noted within Epic:  No active infections  Patient reported infection: None    Therapy Appropriateness:    Is therapy appropriate? Yes, therapy is appropriate and should be continued    DISEASE/MEDICATION-SPECIFIC INFORMATION      For patients on injectable medications: Patient currently has 2 doses left.  Next injection is scheduled for 5/2.    PATIENT SPECIFIC NEEDS     - Does the patient have any physical, cognitive, or cultural barriers? No    - Is the patient high risk? No    - Does the patient require a Care Management Plan? No     - Does the patient require physician intervention or other additional services (i.e. nutrition, smoking cessation, social work)? No      SHIPPING     Specialty Medication(s) to be Shipped:   Inflammatory Disorders: Humira    Other medication(s) to be shipped: No additional medications requested for fill at this time     Changes to insurance: No    Delivery Scheduled: Yes, Expected medication delivery date: 5/11.     Medication will be delivered via UPS to the confirmed prescription address in [facility].    The patient will receive a drug information handout for each medication shipped and additional FDA Medication Guides as required.  Verified that patient has previously received a Conservation officer, historic buildings and a Surveyor, mining.    All of the patient's questions and concerns have been addressed.    Clydell Hakim   [facility] Specialty Pharmacist

## 2020-06-12 ENCOUNTER — Other Ambulatory Visit (HOSPITAL_COMMUNITY): Payer: Self-pay

## 2020-06-12 ENCOUNTER — Telehealth (INDEPENDENT_AMBULATORY_CARE_PROVIDER_SITE_OTHER): Payer: Medicaid Other | Admitting: Nurse Practitioner

## 2020-06-12 DIAGNOSIS — U071 COVID-19: Secondary | ICD-10-CM

## 2020-06-12 DIAGNOSIS — I10 Essential (primary) hypertension: Secondary | ICD-10-CM

## 2020-06-12 DIAGNOSIS — M069 Rheumatoid arthritis, unspecified: Secondary | ICD-10-CM

## 2020-06-12 MED ORDER — MOLNUPIRAVIR EUA 200MG CAPSULE
4.0000 | ORAL_CAPSULE | Freq: Two times a day (BID) | ORAL | 0 refills | Status: AC
  Filled 2020-06-12: qty 40, 5d supply, fill #0

## 2020-06-12 NOTE — Progress Notes (Signed)
Virtual Visit via Telephone Note  I connected with Blake Arnold on 06/12/20 at 10:30 AM EDT by telephone and verified that I am speaking with the correct person using two identifiers.  Location: Patient: home Provider: office   I discussed the limitations, risks, security and privacy concerns of performing an evaluation and management service by telephone and the availability of in person appointments. I also discussed with the patient that there may be a patient responsible charge related to this service. The patient expressed understanding and agreed to proceed.  Chief Complaint  Patient presents with  . Covid Positive     History of Present Illness:  Patient presents today for post-COVID care clinic visit.  Patient has a positive for COVID on 06/08/2020.  His symptoms started the next day on 06/09/2020.  He was tested due to COVID exposure from his family member.  Patient complains of cough, chest pain/tightness, dizziness.  He is also having chills.  He is fully vaccinated.  We discussed that he would be a candidate for oral antiviral treatment for COVID due to history of COPD, hypertension, rheumatoid arthritis.  Patient has been checking his O2 sats at home and states that they have remained stable.  Denies f/c/s, n/v/d, hemoptysis, PND, chest pain or edema.     Observations/Objective:  Vitals with BMI 02/05/2020 02/05/2020 01/14/2020  Height - - 5\' 9"   Weight - - 240 lbs  BMI - - 40.98  Systolic 119 147 829  Diastolic 87 97 78  Pulse 89 87 80  Some encounter information is confidential and restricted. Go to Review Flowsheets activity to see all data.      Assessment and Plan:  Covid 19 Cough:   Stay well hydrated  Stay active  Deep breathing exercises  May start vitamin C 2,000 mg daily, vitamin D3 2,000 IU daily, Zinc 220 mg daily, and Quercetin 500 mg twice daily  May take tylenol or fever or pain  May take mucinex twice daily  Will order Molnupiravir   - no recent labs so unable to prescribe paxlovid  Follow up:  Follow up in 2 weeks or sooner if needed      I discussed the assessment and treatment plan with the patient. The patient was provided an opportunity to ask questions and all were answered. The patient agreed with the plan and demonstrated an understanding of the instructions.   The patient was advised to call back or seek an in-person evaluation if the symptoms worsen or if the condition fails to improve as anticipated.  I provided 23 minutes of non-face-to-face time during this encounter.   Fenton Foy, NP

## 2020-06-12 NOTE — Patient Instructions (Addendum)
Covid 19 Cough:   Stay well hydrated  Stay active  Deep breathing exercises  May start vitamin C 2,000 mg daily, vitamin D3 2,000 IU daily, Zinc 220 mg daily, and Quercetin 500 mg twice daily  May take tylenol or fever or pain  May take mucinex twice daily  Will order Molnupiravir  - no recent labs so unable to prescribe paxlovid  Follow up:  Follow up in 2 weeks or sooner if needed

## 2020-06-14 ENCOUNTER — Encounter: Payer: Medicaid Other | Admitting: Physical Medicine and Rehabilitation

## 2020-06-20 MED FILL — HUMIRA PEN CITRATE FREE 40 MG/0.4 ML: SUBCUTANEOUS | 84 days supply | Qty: 12 | Fill #0

## 2020-06-30 ENCOUNTER — Ambulatory Visit: Payer: Medicaid Other

## 2020-07-03 ENCOUNTER — Ambulatory Visit (INDEPENDENT_AMBULATORY_CARE_PROVIDER_SITE_OTHER): Payer: Medicaid Other | Admitting: Nurse Practitioner

## 2020-07-03 VITALS — BP 145/107 | HR 75 | Temp 97.9°F | Resp 18

## 2020-07-03 DIAGNOSIS — Z8616 Personal history of COVID-19: Secondary | ICD-10-CM | POA: Diagnosis not present

## 2020-07-03 NOTE — Patient Instructions (Signed)
Covid 19 Congestion:   Stay well hydrated  Stay active  Deep breathing exercises  May start vitamin C daily, vitamin D3 daily, Zinc daily  May take tylenol for fever or pain  May take mucinex twice daily  May start zyrtec daily    Follow up:  Follow up if needed

## 2020-07-03 NOTE — Assessment & Plan Note (Signed)
Congestion:   Stay well hydrated  Stay active  Deep breathing exercises  May start vitamin C daily, vitamin D3 daily, Zinc daily  May take tylenol for fever or pain  May take mucinex twice daily  May start zyrtec daily    Follow up:  Follow up if needed

## 2020-07-03 NOTE — Progress Notes (Signed)
@Patient  ID: Blake Arnold, male    DOB: [birth year], 50 y.o.   MRN: [id number]  Chief Complaint  Patient presents with  . Follow-up    Referring provider: [name], DO   HPI  Patient presents today for post-COVID care clinic visit follow-up.  Patient tested positive for COVID on 06/08/2020.  He was seen in our office 2 weeks ago and was prescribed oral antiviral therapy.  Patient states that he is doing much better.  He complains today and allergy symptoms with nasal congestion.  He is currently using flonase nasal spray.  Patient is a smoker but states that he is trying to wean himself off.  Patient's blood pressure was noted to be elevated during office visit today.  He states that he took his blood pressure medicine just before coming into the office had had time to work at this point.  We discussed that he does need to monitor his blood pressure at home and follow-up with his primary care physician if this does not improve. Denies f/c/s, n/v/d, hemoptysis, PND, chest pain or edema.      Allergies  Allergen Reactions  . Lisinopril Swelling  . Varenicline Dermatitis, Rash and Other (See Comments)    Described as an "acne like rash" on his face Also noted significant GI intolerance    Immunization History  Administered Date(s) Administered  . Hepatitis B, adult 01/20/2015  . Influenza,inj,Quad PF,6+ Mos 12/02/2016, 01/05/2018, 11/02/2018  . Influenza-Unspecified 12/11/2015, 12/13/2019  . Moderna Sars-Covid-2 Vaccination 04/26/2019, 05/24/2019  . Pneumococcal Conjugate-13 07/15/2016  . Pneumococcal Polysaccharide-23 12/09/2016  . Tdap 08/09/2013, 09/27/2018    Past Medical History:  Diagnosis Date  . A-fib ([place])   . Acid reflux   . Alcohol abuse   . Anxiety   . Asthma   . AVN (avascular necrosis of bone) ([place])   . Chronic back pain   . COPD (chronic obstructive pulmonary disease) ([place])   . Depression   . Hepatitis C    history of  . History of urinary  frequency   . Hypertension   . Peripheral neuropathy    hands and feet  . Polysubstance abuse ([place]) 01/25/2011  . 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[place])   . 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[place])   . Seizures ([place])     Tobacco History: Social History   Tobacco Use  Smoking Status Current Every Day Smoker  . Packs/day: 2.00  . Years: 34.00  . Pack years: 68.00  . Types: Cigarettes  . Start date: 02/12/1984  Smokeless Tobacco Former Systems developer  . Quit date: 10/24/2012  Tobacco Comment   1.5-2ppd  Quit attempt 12/24/2018   Ready to quit: No Counseling given: Yes Comment: 1.5-2ppd  Quit attempt 12/24/2018   Outpatient Encounter Medications as of 07/03/2020  Medication Sig  . Adalimumab (HUMIRA) 40 MG/0.8ML PSKT Inject 0.8 mLs (40 mg total) into the skin once a week. tuesdays (Patient taking differently: Inject 0.8 mLs into the skin once a week. Sunday)  . Albuterol Sulfate (PROAIR RESPICLICK) 676 (90 Base) MCG/ACT AEPB Inhale 2 puffs into the lungs every 4 (four) hours as needed (wheezing; cough).  [name] amitriptyline (ELAVIL) 10 MG tablet Take 1 tablet (10 mg total) by mouth at bedtime.  [name] amLODipine (NORVASC) 5 MG tablet TAKE 1 TABLET(5 MG) BY MOUTH AT BEDTIME  . aspirin EC 81 MG tablet Take 1 tablet (81 mg total) by mouth daily.  . diclofenac Sodium (VOLTAREN) 1 % GEL Apply 1 application topically 3 (three) times daily.  . famotidine (  PEPCID) 10 MG tablet Take 1 tablet (10 mg total) by mouth 2 (two) times daily.  [name] FOLIC ACID PO Take by mouth.  . gabapentin (NEURONTIN) 400 MG capsule Take 1 capsule (400 mg total) by mouth 3 (three) times daily.  . hydrochlorothiazide (HYDRODIURIL) 25 MG tablet TAKE 1 TABLET(25 MG) BY MOUTH DAILY  . ibuprofen (ADVIL) 800 MG tablet Take 800 mg by mouth every 6 (six) hours.  . lidocaine (LIDODERM) 5 % Place 1 patch onto the skin daily. Remove & Discard patch within 12 hours or as directed by MD  . Methotrexate, Anti-Rheumatic, (METHOTREXATE, PF, [place]) Inject 1 mL into the skin once  a week.  (Patient not taking: Reported on 01/14/2020)  . metoprolol tartrate (LOPRESSOR) 50 MG tablet TAKE 1 TABLET(50 MG) BY MOUTH TWICE DAILY  . neomycin-polymyxin-hydrocortisone (CORTISPORIN) OTIC solution Apply 1-2 drops to toe after soaking twice a day (Patient not taking: Reported on 01/14/2020)  . pantoprazole (PROTONIX) 40 MG tablet Take 1 tablet (40 mg total) by mouth daily. (Patient not taking: Reported on 01/14/2020)  . simvastatin (ZOCOR) 40 MG tablet TAKE 1 TABLET(40 MG) BY MOUTH DAILY  . terbinafine (LAMISIL) 250 MG tablet Take 1 tablet (250 mg total) by mouth daily. (Patient not taking: Reported on 01/14/2020)  . triamcinolone ointment (KENALOG) 0.5 % Apply 1 application topically 2 (two) times daily. (Patient not taking: Reported on 01/14/2020)   No facility-administered encounter medications on file as of 07/03/2020.     Review of Systems  Review of Systems  Constitutional: Negative.  Negative for fatigue and fever.  HENT: Positive for congestion and postnasal drip.   Respiratory: Negative for cough and shortness of breath.   Cardiovascular: Negative.  Negative for chest pain, palpitations and leg swelling.  Gastrointestinal: Negative.   Allergic/Immunologic: Negative.   Neurological: Negative.   Psychiatric/Behavioral: Negative.        Physical Exam  BP (!) 145/107   Pulse 75   Temp 97.9 F (36.6 C)   Resp 18   SpO2 97%   Wt Readings from Last 5 Encounters:  01/14/20 240 lb (108.9 kg)  12/22/19 247 lb (112 kg)  10/05/19 248 lb (112.5 kg)  09/08/19 (!) 245 lb 12.8 oz (111.5 kg)  08/24/19 247 lb (112 kg)     Physical Exam Vitals and nursing note reviewed.  Constitutional:      General: He is not in acute distress.    Appearance: He is well-developed.  Cardiovascular:     Rate and Rhythm: Normal rate and regular rhythm.  Pulmonary:     Effort: Pulmonary effort is normal.     Breath sounds: Normal breath sounds.  Musculoskeletal:     Right lower leg: No  edema.     Left lower leg: No edema.  Skin:    General: Skin is warm and dry.  Neurological:     Mental Status: He is alert and oriented to person, place, and time.  Psychiatric:        Mood and Affect: Mood normal.        Behavior: Behavior normal.        Assessment & Plan:   History of COVID-19 Congestion:   Stay well hydrated  Stay active  Deep breathing exercises  May start vitamin C daily, vitamin D3 daily, Zinc daily  May take tylenol for fever or pain  May take mucinex twice daily  May start zyrtec daily    Follow up:  Follow up if needed  Fenton Foy, NP 07/03/2020

## 2020-08-29 ENCOUNTER — Ambulatory Visit: Payer: Medicaid Other | Admitting: Physical Medicine and Rehabilitation

## 2020-09-13 NOTE — Unmapped (Signed)
Patient has enough medication on hand, rescheduling refill call for 8/24

## 2020-09-21 ENCOUNTER — Ambulatory Visit: Payer: Medicaid Other | Admitting: Physical Medicine and Rehabilitation

## 2020-10-05 NOTE — Unmapped (Signed)
[facility] Refill Coordination Note    Specialty Medication(s) to be Shipped:   Inflammatory Disorders: Humira    Other medication(s) to be shipped: No additional medications requested for fill at this time     [name], DOB: 1970-05-31  Phone: [phone] (home)       All above HIPAA information was verified with patient.     Was a Nurse, learning disability used for this call? No    Completed refill call assessment today to schedule patient's medication shipment from the [facility] [phone]).  All relevant notes have been reviewed.     Specialty medication(s) and dose(s) confirmed: Regimen is correct and unchanged.   Changes to medications: [name] reports no changes at this time.  Changes to insurance: No  New side effects reported not previously addressed with a pharmacist or physician: None reported  Questions for the pharmacist: No    Confirmed patient received a Conservation officer, historic buildings and a Surveyor, mining with first shipment. The patient will receive a drug information handout for each medication shipped and additional FDA Medication Guides as required.       DISEASE/MEDICATION-SPECIFIC INFORMATION        For patients on injectable medications: Patient currently has 1 doses left.  Next injection is scheduled for 8/28.    SPECIALTY MEDICATION ADHERENCE     Medication Adherence    Patient reported X missed doses in the last month: 0  Specialty Medication: Humira  Patient is on additional specialty medications: No  Patient is on more than two specialty medications: No  Any gaps in refill history greater than 2 weeks in the last 3 months: no  Demonstrates understanding of importance of adherence: yes  Informant: patient  Adherence tools used: calendar              Were doses missed due to medication being on hold? No    Humira 40mg /0.12ml: Patient has 7 days of medication on hand    REFERRAL TO PHARMACIST     Referral to the pharmacist: Not needed      [facility] Shipping address confirmed in Epic.     Delivery Scheduled: Yes, Expected medication delivery date: 9/1.     Medication will be delivered via UPS to the prescription address in Epic WAM.    Olga Millers   [facility] Specialty Technician

## 2020-10-10 ENCOUNTER — Other Ambulatory Visit: Payer: Self-pay

## 2020-10-10 ENCOUNTER — Encounter: Payer: Self-pay | Admitting: Physical Medicine and Rehabilitation

## 2020-10-10 ENCOUNTER — Encounter
Payer: Medicaid Other | Attending: Physical Medicine and Rehabilitation | Admitting: Physical Medicine and Rehabilitation

## 2020-10-10 VITALS — BP 133/83 | HR 62 | Temp 98.3°F | Ht 68.0 in | Wt 237.0 lb

## 2020-10-10 DIAGNOSIS — G8929 Other chronic pain: Secondary | ICD-10-CM | POA: Diagnosis not present

## 2020-10-10 DIAGNOSIS — M25561 Pain in right knee: Secondary | ICD-10-CM | POA: Insufficient documentation

## 2020-10-10 DIAGNOSIS — M25562 Pain in left knee: Secondary | ICD-10-CM | POA: Diagnosis present

## 2020-10-10 DIAGNOSIS — M87052 Idiopathic aseptic necrosis of left femur: Secondary | ICD-10-CM | POA: Insufficient documentation

## 2020-10-10 MED ORDER — VITAMIN B-6 100 MG PO TABS: 100.0000 mg | ORAL_TABLET | Freq: Every day | ORAL | 3 refills | Status: DC

## 2020-10-10 NOTE — Progress Notes (Addendum)
Subjective:    Patient ID: Blake Arnold, male    DOB: [birth year], 50 y.o.   MRN: [id number]  HPI  Mr. Inserra is a 50 year old man who presents for follow-up of cervical facet arthropathy.  1) Cervical facet arthropathy:   -The burning in his arms is now gone.   -He continues to have pain at C7 with no surrounding tenderness.   -He asks whether he can try a higher dose of Gabapentin. He is currently taking Gabapentin '300mg'$  TID. It does not make him sleepy.   2) Hip pain has improved.   3) Obesity: Steadily losing weight! He has lost another 7 lbs in the past 2 months!  -He walks a lot as part of his work.  4) Vocation: He loves his job. He is a Clinical cytogeneticist. He has been wanting to go back to work for a long time.   5) Diet: He has tried ginger and would be interested in alit of pain relieving foods.   -He has stopped all soft drinks.   6) Diffuse joint pains -He is only taking Neurontin right now -no benefit with Meloxicam before -it is mostly present in right knee, right hip, neck -he is taking tylenol -lidocaine patches do not help   Prior history: Mr. Loud is a 50 year old man who presents for follow-up of chronic pain in multiple joints including his bilateral shoulders, knees, hips, and legs. He has been diagnosed with both rheumatoid arthritis and osteoarthritis. He is on methotrexate for his RA but has not taken this in the past 2 months.   His worst pain is currently his right knee pain. His pain has been present for 30 years but the knee pain has been aggravating him recently. He has tried Mobic, Ibuprofen, Tylenol, Tramadol, Roxicodone, Vicodin. He has been on '4000mg'$  of Gabapentin without any benefit. I prescribed Lyrica, Cymbalta, and Amitriptyline previously and he did not tolerate these well. 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 I repeated XR of his knees previsouly, personally reviewed and discussed with  patient, and they show no evidence of OA. Knee symptoms are worse when ascending and descending stairs and after sitting for prolonged periods. Discussed the importance of weight loss last visit and he has managed to lose 10 lbs in the past month! He started working but this has exacerbated his pain and he does not feel that he can continue it. He remains quite active and is on his feet most of the day. The worst pain currently is cervical pain that radiates into his arms occasionally, worse on the left side. He does not want any narcotic medication as he has a history of addiction.   Pain Inventory Average Pain 8 Pain Right Now 8 My pain is sharp, stabbing, tingling, and aching  In the last 24 hours, has pain interfered with the following? General activity 8 Relation with others 8 Enjoyment of life 8 What TIME of day is your pain at its worst?  all Sleep (in general) Poor  Pain is worse with: walking, bending, sitting, inactivity, and standing Pain improves with: rest, therapy/exercise, and pacing activities Relief from Meds: 3     Family History  Problem Relation Age of Onset   Heart attack Mother    Atrial fibrillation Mother    Skin cancer Father    Colon polyps Neg Hx    Esophageal cancer Neg Hx    Stomach cancer Neg Hx  Rectal cancer Neg Hx    Social History   Socioeconomic History   Marital status: Single    Spouse name: Not on file   Number of children: Not on file   Years of education: Not on file   Highest education level: Not on file  Occupational History   Not on file  Tobacco Use   Smoking status: Every Day    Packs/day: 2.00    Years: 34.00    Pack years: 68.00    Types: Cigarettes    Start date: 02/12/1984   Smokeless tobacco: Former    Quit date: 10/24/2012   Tobacco comments:    1.5-2ppd  Quit attempt 12/24/2018  Vaping Use   Vaping Use: Never used  Substance and Sexual Activity   Alcohol use: Yes    Comment: socially   Drug use: Not Currently     Types: "Crack" cocaine, Cocaine    Comment: last use 07/26/13   Sexual activity: Never  Other Topics Concern   Not on file  Social History Narrative   Not on file   Social Determinants of Health   Financial Resource Strain: Not on file  Food Insecurity: Not on file  Transportation Needs: Not on file  Physical Activity: Not on file  Stress: Not on file  Social Connections: Not on file   Past Surgical History:  Procedure Laterality Date   APPENDECTOMY     CARDIOVERSION  03/07/2006   CARPAL TUNNEL RELEASE     CERVICAL FUSION     LAPAROSCOPIC APPENDECTOMY N/A 07/23/2017   Procedure: APPENDECTOMY LAPAROSCOPIC;  Surgeon: Georganna Skeans, MD;  Location: [place];  Service: General;  Laterality: N/A;   TOTAL HIP ARTHROPLASTY Right 2016   Past Medical History:  Diagnosis Date   A-fib ([place])    Acid reflux    Alcohol abuse    Anxiety    Asthma    AVN (avascular necrosis of bone) ([facility])    Chronic back pain    COPD (chronic obstructive pulmonary disease) ([facility])    Depression    Hepatitis C    history of   History of urinary frequency    Hypertension    Peripheral neuropathy    hands and feet   Polysubstance abuse ([place]) 01/25/2011   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[place])    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[place])    Seizures ([place])    There were no vitals taken for this visit.  Opioid Risk Score:   Fall Risk Score:  `1  Depression screen PHQ 2/9  Depression screen [facility] 2/9 01/14/2020 10/05/2019 09/08/2019 08/24/2019 07/15/2019 06/02/2019 04/05/2019  Decreased Interest 0 0 0 0 0 0 0  Down, Depressed, Hopeless 0 0 0 0 0 0 0  PHQ - 2 Score 0 0 0 0 0 0 0  Altered sleeping 2 - 0 - - - -  Tired, decreased energy 2 - 0 - - - -  Change in appetite 2 - 0 - - - -  Feeling bad or failure about yourself  0 - 0 - - - -  Trouble concentrating 0 - 0 - - - -  Moving slowly or fidgety/restless 0 - 0 - - - -  Suicidal thoughts 0 - 0 - - - -  PHQ-9 Score 6 - 0 - - - -  Difficult doing work/chores Very difficult - - - -  - -    Review of Systems  Constitutional: Negative.   HENT: Negative.    Eyes: Negative.   Respiratory: Negative.  Cardiovascular: Negative.   Gastrointestinal: Negative.   Endocrine: Negative.   Genitourinary: Negative.   Musculoskeletal:  Positive for arthralgias, back pain, neck pain and neck stiffness.  Skin: Negative.   Allergic/Immunologic: Negative.   Neurological:  Positive for numbness.       Tingling  Hematological: Negative.   Psychiatric/Behavioral: Negative.    All other systems reviewed and are negative.      Objective:   Physical Exam Gen: no distress, normal appearing HEENT: oral mucosa pink and moist, NCAT Cardio: Reg rate Chest: normal effort, normal rate of breathing Abd: soft, non-distended Ext: no edema Psych: pleasant, normal affect Skin: intact Neuro: AOx3.  Musculoskeletal: Normal ambulation. +pain in knees with sit to stand. +patellar grind. No effusions. Tenderness to palpation of cervical spine and lateral masses with bilateral range of motion restriction. +left hip tenderness. Decreased sensation in both hands, worse on thumbs.  Psych: pleasant, normal affect    Assessment & Plan:  Mr. Veneziano is a 50 year old man who presents with chronic pain in multiple joints including his bilateral shoulders, knees, hips, and legs, as well as cervical stenosis. He has been diagnosed with both rheumatoid arthritis and osteoarthritis. He is on methotrexate for his RA which he has not taken for the past 2 months.    1)Bilateral knee pain R>L: --XRs personally reviewed and discussed with patient--show no evidence of OA.  --Continue Diclofenac gel. Can increase from twice per day to QID application. This has been helping. I have provided a refill.    2) Cervical spine pain: -No longer with radiating symptoms -Physical therapy made his pain worse in the past -Advised to avoid too much neck extension, which can aggravate stenosis.  -Apply blue emu  oil  -Discussed current symptoms of pain and history of pain.  -Discussed benefits of exercise in reducing pain. -Discussed following foods that may reduce pain: 1) Ginger 2) Blueberries 3) Salmon 4) Pumpkin seeds 5) dark chocolate 6) turmeric 7) tart cherries 8) virgin olive oil 9) chilli peppers 10) mint  Link to further information on diet for chronic pain: http://www.randall.com/  Turmeric to reduce inflammation--can be used in cooking or taken as a supplement.  Benefits of turmeric:  -Highly anti-inflammatory  -Increases antioxidants  -Improves memory, attention, brain disease  -Lowers risk of heart disease  -May help prevent cancer  -Decreases pain  -Alleviates depression  -Delays aging and decreases risk of chronic disease  -Consume with black pepper to increase absorption    Turmeric Milk Recipe:  1 cup milk  1 tsp turmeric  1 tsp cinnamon  1 tsp grated ginger (optional)  Black pepper (boosts the anti-inflammatory properties of turmeric).  1 tsp honey   3) Left hip AVN: --Failed  lyrica, Cymbalta, Amitriptyline. Discussed cervical epidurals which he may consider in future; he would like to discuss with his spine surgeon first. -Continue Gabapentin to '400mg'$  TID   4) Morbid Obesity:  Explained that every pound of weight is 6 pounds on the knees. He remembered this fact. Furthermore, obesity results in inflammation which increases pain. He likes to eat carbs, red meats, nuts, and to drink cola. Advised to cut down on his consumption of cola (he has done!) and carbs which are less nutritive and more inflammatory. Will continue to monitor his weight and weight reduction will be a large part of decreasing his pain. Commended on loss of 17 lbs in the last 8 months! Comended on stopping coca-cola. Continue walking a lot at work.  Obesity: -Educated  regarding health benefits of weight  loss- for pain, general health, chronic disease prevention, immune health, mental health.  -Will monitor weight every visit.  -Consider [place] tea daily.  -Discussed the benefits of intermittent fasting. -Discussed foods that can assist in weight loss: 1) leafy greens- high in fiber and nutrients 2) dark chocolate- improves metabolism (if prefer sweetened, best to sweeten with honey instead of sugar).  3) cruciferous vegetables- high in fiber and protein 4) full fat yogurt: high in healthy fat, protein, calcium, and probiotics 5) apples- high in a variety of phytochemicals 6) nuts- high in fiber and protein that increase feelings of fullness 7) grapefruit: rich in nutrients, antioxidants, and fiber (not to be taken with anticoagulation) 8) beans- high in protein and fiber 9) salmon- has high quality protein and healthy fats 10) green tea- rich in polyphenols 11) eggs- rich in choline and vitamin D 12) tuna- high protein, boosts metabolism 13) avocado- decreases visceral abdominal fat 14) chicken (pasture raised): high in protein and iron 15) blueberries- reduce abdominal fat and cholesterol 16) whole grains- decreases calories retained during digestion, speeds metabolism 17) chia seeds- curb appetite 18) chilies- increases fat metabolism  -Discussed supplements that can be used:  1) Metatrim '400mg'$  BID 30 minutes before breakfast and dinner  2) Sphaeranthus indicus and Garcinia mangostana (combinations of these and #1 can be found in capsicum and zychrome  3) green coffee bean extract '400mg'$  twice per day or Irvingia (african mango) 150 to '300mg'$  twice per day.  -Enjoying work! He gets to walk a lot there.      All questions were encouraged and answered. Follow up with me in 1 month.   5) Diffuse joint pains secondary to arthritis: -recommended applying blue emu oil -Discussed current symptoms of pain and history of pain.  -Discussed benefits of exercise in reducing pain. -Discussed  following foods that may reduce pain: 1) Ginger (especially studied for arthritis)- reduce leukotriene production to decrease inflammation 2) Blueberries- high in phytonutrients that decrease inflammation 3) Salmon- marine omega-3s reduce joint swelling and pain 4) Pumpkin seeds- reduce inflammation 5) dark chocolate- reduces inflammation 6) turmeric- reduces inflammation 7) tart cherries - reduce pain and stiffness 8) extra virgin olive oil - its compound olecanthal helps to block prostaglandins  9) chili peppers- can be eaten or applied topically via capsaicin 10) mint- helpful for headache, muscle aches, joint pain, and itching 11) garlic- reduces inflammation  Link to further information on diet for chronic pain: http://www.randall.com/  Start B6 supplement '100mg'$  daily Called in low dose naltrexone to [facility]  6) Right knee OA -XR ordered -continue active job -continue cooking with olive oil

## 2020-10-10 NOTE — Patient Instructions (Signed)
Obesity: -Educated regarding health benefits of weight loss- for pain, general health, chronic disease prevention, immune health, mental health.  -Will monitor weight every visit.  -Consider Roobois tea daily.  -Discussed the benefits of intermittent fasting. -Discussed foods that can assist in weight loss: 1) leafy greens- high in fiber and nutrients 2) dark chocolate- improves metabolism (if prefer sweetened, best to sweeten with honey instead of sugar).  3) cruciferous vegetables- high in fiber and protein 4) full fat yogurt: high in healthy fat, protein, calcium, and probiotics 5) apples- high in a variety of phytochemicals 6) nuts- high in fiber and protein that increase feelings of fullness 7) grapefruit: rich in nutrients, antioxidants, and fiber (not to be taken with anticoagulation) 8) beans- high in protein and fiber 9) salmon- has high quality protein and healthy fats 10) green tea- rich in polyphenols 11) eggs- rich in choline and vitamin D 12) tuna- high protein, boosts metabolism 13) avocado- decreases visceral abdominal fat 14) chicken (pasture raised): high in protein and iron 15) blueberries- reduce abdominal fat and cholesterol 16) whole grains- decreases calories retained during digestion, speeds metabolism 17) chia seeds- curb appetite 18) chilies- increases fat metabolism  -Discussed supplements that can be used:  1) Metatrim 400mg BID 30 minutes before breakfast and dinner  2) Sphaeranthus indicus and Garcinia mangostana (combinations of these and #1 can be found in capsicum and zychrome  3) green coffee bean extract 400mg twice per day or Irvingia (african mango) 150 to 300mg twice per day.  

## 2020-10-11 MED FILL — HUMIRA PEN CITRATE FREE 40 MG/0.4 ML: SUBCUTANEOUS | 84 days supply | Qty: 12 | Fill #1

## 2020-10-18 ENCOUNTER — Other Ambulatory Visit: Payer: Self-pay | Admitting: Family Medicine

## 2020-11-08 ENCOUNTER — Other Ambulatory Visit: Payer: Self-pay

## 2020-11-08 ENCOUNTER — Ambulatory Visit (INDEPENDENT_AMBULATORY_CARE_PROVIDER_SITE_OTHER): Payer: Medicaid Other | Admitting: Family Medicine

## 2020-11-08 VITALS — BP 142/95 | HR 72 | Ht 68.0 in | Wt 235.2 lb

## 2020-11-08 DIAGNOSIS — Z96641 Presence of right artificial hip joint: Secondary | ICD-10-CM | POA: Diagnosis not present

## 2020-11-08 DIAGNOSIS — M25559 Pain in unspecified hip: Secondary | ICD-10-CM

## 2020-11-08 DIAGNOSIS — G8929 Other chronic pain: Secondary | ICD-10-CM

## 2020-11-08 DIAGNOSIS — M545 Low back pain, unspecified: Secondary | ICD-10-CM | POA: Diagnosis not present

## 2020-11-08 DIAGNOSIS — M8700 Idiopathic aseptic necrosis of unspecified bone: Secondary | ICD-10-CM

## 2020-11-08 MED ORDER — NAPROXEN 500 MG PO TABS: 500.0000 mg | ORAL_TABLET | Freq: Two times a day (BID) | ORAL | 0 refills | Status: DC

## 2020-11-08 MED ORDER — METHOCARBAMOL 500 MG PO TABS: 500.0000 mg | ORAL_TABLET | Freq: Three times a day (TID) | ORAL | 0 refills | Status: DC | PRN

## 2020-11-08 NOTE — Patient Instructions (Addendum)
It was great seeing you today! Your back pain is likely due to sciatic or muscle strain.   As discusssed, stop by the pharmacy to pick up your prescriptions. If pain is not improving, be sure to follow up.  For your back pain, Take 1500 mg Tylenol twice a day, take Robaxin three times a day, take Naprosyn as needed for pain. Be sure to stop by the pharmacy to pick up Lidocaine patches. Apply for 12 hours and then remove.   Take care,   [place]

## 2020-11-08 NOTE — Progress Notes (Signed)
   SUBJECTIVE:   CHIEF COMPLAINT / HPI:    Blake Arnold is a 50 y.o. male here for right hip and right sided lower back pain.  Notes that he also has increased pain in his left knee.  Pain radiates to his anterior thigh.  Has history of right hip replacement, rheumatoid arthritis and AVN.  Follows with the pain clinic.  Has been trying to avoid opioids.  Pain is worse with leaning back.  States he has had a bad leg for months.  Yesterday, it hurt so bad he had to go to the floor and could not walk.  Denies fall.  Took an oxycodone last night that he had from a previous surgery which helped after he laid down.  Does use Voltaren gel and lidocaine patches.  Takes Tylenol without relief.  No fevers, saddle paresthesia, loss of bowel and bladder control, night sweats.  Has chronic neck pain with history of spinal fusion several years ago.  Has upcoming imaging ordered by his pain doctor.    PERTINENT  PMH / PSH: reviewed and updated as appropriate   OBJECTIVE:   BP (!) 142/95   Pulse 72   Ht 5\' 8"  (1.727 m)   Wt 235 lb 3.2 oz (106.7 kg)   SpO2 99%   BMI 35.76 kg/m    GEN: Uncomfortable appearing male in no acute distress  CVS: well perfused  RESP: speaking in full sentences without pause, no respiratory distress  Lumbar spine: - Inspection: no gross deformity or asymmetry, swelling or ecchymosis. No skin changes - Palpation: L4/L5 TTP over the spinous processes and paraspinal muscles, no SI joint tenderness b/l - ROM: Limited range of motion due to pain, pain with passive extension  - Strength: 4+/5 strength of lower extremity in L4-S1 nerve root distributions RLE, 5/5 LLE - Neuro: sensation intact in the L4-S1 nerve root distribution b/l, 2+ L4 and S1 reflexes   ASSESSMENT/PLAN:   Chronic pain Patient is a 50 year old male with history of right hip replacement, RA and AVN who presented for worsening chronic pain.  Discussed with patient gradually returning to normal activities,  as tolerated. Pt to continue ordinary activities within the limits permitted by pain. Will prescribe Naproxen sodium  and Robaxin for pain relief.  Advised patient to avoid other NSAIDs while taking Naprosyn medication.  Continue Tylenol.  Wishes to continue avoiding opioids. Counseled patient on red flag symptoms and when to seek immediate care.  No red flags suggesting cauda equina syndrome or progressive major motor weakness.  Given history ordered lumbar and right hip x-rays. Patient to return if symptoms do not improve with conservative treatment.  ED precautions given.  Work note provided.     Lyndee Hensen, DO PGY-3, [facility] 11/08/2020

## 2020-11-09 ENCOUNTER — Ambulatory Visit
Admission: RE | Admit: 2020-11-09 | Discharge: 2020-11-09 | Disposition: A | Discharge status: Home / Self Care | Payer: Medicaid Other | Source: Ambulatory Visit | Attending: Physical Medicine and Rehabilitation | Admitting: Physical Medicine and Rehabilitation

## 2020-11-09 ENCOUNTER — Encounter: Payer: Self-pay | Admitting: Family Medicine

## 2020-11-09 DIAGNOSIS — M545 Low back pain, unspecified: Secondary | ICD-10-CM

## 2020-11-09 DIAGNOSIS — G8929 Other chronic pain: Secondary | ICD-10-CM

## 2020-11-09 DIAGNOSIS — M25562 Pain in left knee: Secondary | ICD-10-CM

## 2020-11-09 DIAGNOSIS — Z96641 Presence of right artificial hip joint: Secondary | ICD-10-CM

## 2020-11-09 DIAGNOSIS — M25559 Pain in unspecified hip: Secondary | ICD-10-CM

## 2020-11-09 NOTE — Assessment & Plan Note (Signed)
Patient is a 50 year old male with history of right hip replacement, RA and AVN who presented for worsening chronic pain.  Discussed with patient gradually returning to normal activities, as tolerated. Pt to continue ordinary activities within the limits permitted by pain. Will prescribe Naproxen sodium  and Robaxin for pain relief.  Advised patient to avoid other NSAIDs while taking Naprosyn medication.  Continue Tylenol.  Wishes to continue avoiding opioids. Counseled patient on red flag symptoms and when to seek immediate care.  No red flags suggesting cauda equina syndrome or progressive major motor weakness.  Given history ordered lumbar and right hip x-rays. Patient to return if symptoms do not improve with conservative treatment.  ED precautions given.  Work note provided.

## 2020-11-13 ENCOUNTER — Encounter: Payer: Self-pay | Admitting: Family Medicine

## 2020-11-13 ENCOUNTER — Encounter
Payer: Medicaid Other | Attending: Physical Medicine and Rehabilitation | Admitting: Physical Medicine and Rehabilitation

## 2020-11-13 ENCOUNTER — Other Ambulatory Visit: Payer: Self-pay

## 2020-11-13 DIAGNOSIS — M48062 Spinal stenosis, lumbar region with neurogenic claudication: Secondary | ICD-10-CM | POA: Diagnosis not present

## 2020-11-13 DIAGNOSIS — M5416 Radiculopathy, lumbar region: Secondary | ICD-10-CM | POA: Insufficient documentation

## 2020-11-13 MED ORDER — GABAPENTIN 300 MG PO CAPS: 300.0000 mg | ORAL_CAPSULE | Freq: Three times a day (TID) | ORAL | 3 refills | Status: DC
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
## 2020-11-26 ENCOUNTER — Other Ambulatory Visit: Payer: Self-pay

## 2020-11-26 ENCOUNTER — Ambulatory Visit
Admission: RE | Admit: 2020-11-26 | Discharge: 2020-11-26 | Disposition: A | Discharge status: Home / Self Care | Payer: Medicaid Other | Source: Ambulatory Visit | Attending: Physical Medicine and Rehabilitation | Admitting: Physical Medicine and Rehabilitation

## 2020-11-26 DIAGNOSIS — M48062 Spinal stenosis, lumbar region with neurogenic claudication: Secondary | ICD-10-CM

## 2020-11-28 ENCOUNTER — Other Ambulatory Visit: Payer: Self-pay

## 2020-11-28 ENCOUNTER — Encounter (HOSPITAL_BASED_OUTPATIENT_CLINIC_OR_DEPARTMENT_OTHER): Payer: Medicaid Other | Admitting: Physical Medicine and Rehabilitation

## 2020-11-28 DIAGNOSIS — M5416 Radiculopathy, lumbar region: Secondary | ICD-10-CM
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
## 2020-12-20 NOTE — Unmapped (Signed)
Specialty Medication(s): Humira    Mr.Weil has been dis-enrolled from the [facility] specialty pharmacy services due to patient states he is going to find a new MD in [place] has he is having a hard time coming to [facility] for appointmetns.   RX for Humira is expired and patient's last OV was in Feb 2021.    Additional information provided to the patient: Informed patient we can re-enroll in specialty services if a new RX comes through from new provider.     Julianne Rice  [facility] Specialty Pharmacist

## 2021-01-15 ENCOUNTER — Other Ambulatory Visit: Payer: Self-pay | Admitting: Family Medicine

## 2021-01-15 ENCOUNTER — Other Ambulatory Visit: Payer: Self-pay

## 2021-01-15 ENCOUNTER — Encounter
Payer: Medicaid Other | Attending: Physical Medicine and Rehabilitation | Admitting: Physical Medicine and Rehabilitation

## 2021-01-15 VITALS — BP 138/89 | HR 81 | Ht 68.0 in | Wt 227.0 lb

## 2021-01-15 DIAGNOSIS — M25562 Pain in left knee: Secondary | ICD-10-CM | POA: Diagnosis present

## 2021-01-15 DIAGNOSIS — M25561 Pain in right knee: Secondary | ICD-10-CM | POA: Insufficient documentation

## 2021-01-15 DIAGNOSIS — M25511 Pain in right shoulder: Secondary | ICD-10-CM | POA: Diagnosis not present

## 2021-01-15 DIAGNOSIS — M79605 Pain in left leg: Secondary | ICD-10-CM | POA: Insufficient documentation

## 2021-01-15 DIAGNOSIS — M25551 Pain in right hip: Secondary | ICD-10-CM | POA: Diagnosis not present

## 2021-01-15 DIAGNOSIS — G8929 Other chronic pain: Secondary | ICD-10-CM | POA: Diagnosis present
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
## 2021-01-24 DIAGNOSIS — M0579 Rheumatoid arthritis with rheumatoid factor of multiple sites without organ or systems involvement: Principal | ICD-10-CM

## 2021-01-25 ENCOUNTER — Ambulatory Visit (INDEPENDENT_AMBULATORY_CARE_PROVIDER_SITE_OTHER): Payer: Medicaid Other

## 2021-01-25 ENCOUNTER — Ambulatory Visit (INDEPENDENT_AMBULATORY_CARE_PROVIDER_SITE_OTHER): Payer: Medicaid Other | Admitting: Physician Assistant

## 2021-01-25 ENCOUNTER — Encounter: Payer: Self-pay | Admitting: Physician Assistant

## 2021-01-25 ENCOUNTER — Other Ambulatory Visit: Payer: Self-pay

## 2021-01-25 DIAGNOSIS — M5441 Lumbago with sciatica, right side: Secondary | ICD-10-CM

## 2021-01-25 DIAGNOSIS — M7061 Trochanteric bursitis, right hip: Secondary | ICD-10-CM | POA: Diagnosis not present

## 2021-01-25 DIAGNOSIS — M25511 Pain in right shoulder: Secondary | ICD-10-CM

## 2021-01-25 MED ORDER — LIDOCAINE HCL 1 % IJ SOLN
3.0000 mL | INTRAMUSCULAR | Status: AC | PRN
  Administered 2021-01-25: 3 mL

## 2021-01-25 MED ORDER — METHYLPREDNISOLONE ACETATE 40 MG/ML IJ SUSP
40.0000 mg | INTRAMUSCULAR | Status: AC | PRN
  Administered 2021-01-25: 40 mg via INTRA_ARTICULAR
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
## 2021-01-26 NOTE — Addendum Note (Signed)
Addended by: Robyne Peers on: 01/26/2021 09:24 AM   Modules accepted: Orders

## 2021-02-20 ENCOUNTER — Ambulatory Visit: Payer: Medicaid Other

## 2021-02-21 ENCOUNTER — Emergency Department (HOSPITAL_COMMUNITY)
Admission: EM | Admit: 2021-02-21 | Discharge: 2021-02-21 | Disposition: A | Discharge status: Home / Self Care | Payer: Medicaid Other | Attending: Emergency Medicine | Admitting: Emergency Medicine

## 2021-02-21 ENCOUNTER — Other Ambulatory Visit: Payer: Self-pay

## 2021-02-21 DIAGNOSIS — Z7982 Long term (current) use of aspirin: Secondary | ICD-10-CM | POA: Diagnosis not present

## 2021-02-21 DIAGNOSIS — F10120 Alcohol abuse with intoxication, uncomplicated: Secondary | ICD-10-CM | POA: Insufficient documentation

## 2021-02-21 DIAGNOSIS — Z79899 Other long term (current) drug therapy: Secondary | ICD-10-CM | POA: Insufficient documentation

## 2021-02-21 DIAGNOSIS — F10229 Alcohol dependence with intoxication, unspecified: Secondary | ICD-10-CM

## 2021-02-21 LAB — CBC
HCT: 50.4 % (ref 39.0–52.0)
Hemoglobin: 18.3 g/dL — ABNORMAL HIGH (ref 13.0–17.0)
MCH: 36.1 pg — ABNORMAL HIGH (ref 26.0–34.0)
MCHC: 36.3 g/dL — ABNORMAL HIGH (ref 30.0–36.0)
MCV: 99.4 fL (ref 80.0–100.0)
Platelets: 256 10*3/uL (ref 150–400)
RBC: 5.07 MIL/uL (ref 4.22–5.81)
RDW: 12.8 % (ref 11.5–15.5)
WBC: 10.2 10*3/uL (ref 4.0–10.5)
nRBC: 0 % (ref 0.0–0.2)

## 2021-02-21 LAB — COMPREHENSIVE METABOLIC PANEL
ALT: 45 U/L — ABNORMAL HIGH (ref 0–44)
AST: 26 U/L (ref 15–41)
Albumin: 3.9 g/dL (ref 3.5–5.0)
Alkaline Phosphatase: 57 U/L (ref 38–126)
Anion gap: 11 (ref 5–15)
BUN: 9 mg/dL (ref 6–20)
CO2: 23 mmol/L (ref 22–32)
Calcium: 8.7 mg/dL — ABNORMAL LOW (ref 8.9–10.3)
Chloride: 104 mmol/L (ref 98–111)
Creatinine, Ser: 0.76 mg/dL (ref 0.61–1.24)
GFR, Estimated: >60 mL/min (ref >60)
Glucose, Bld: 101 mg/dL — ABNORMAL HIGH (ref 70–99)
Potassium: 4 mmol/L (ref 3.5–5.1)
Sodium: 138 mmol/L (ref 135–145)
Total Bilirubin: 0.5 mg/dL (ref 0.3–1.2)
Total Protein: 8.2 g/dL — ABNORMAL HIGH (ref 6.5–8.1)

## 2021-02-21 LAB — RAPID URINE DRUG SCREEN, HOSP PERFORMED
Amphetamines: NOT DETECTED
Barbiturates: NOT DETECTED
Benzodiazepines: NOT DETECTED
Cocaine: POSITIVE — AB
Opiates: NOT DETECTED
Tetrahydrocannabinol: NOT DETECTED

## 2021-02-21 LAB — ETHANOL: Alcohol, Ethyl (B): 333 mg/dL (ref <10)

## 2021-02-21 MED ORDER — CHLORDIAZEPOXIDE HCL 25 MG PO CAPS: ORAL_CAPSULE | ORAL | 0 refills | Status: DC

## 2021-02-21 MED ORDER — ONDANSETRON 4 MG PO TBDP: 4.0000 mg | ORAL_TABLET | Freq: Three times a day (TID) | ORAL | 0 refills | Status: DC | PRN

## 2021-02-21 NOTE — ED Triage Notes (Signed)
Pt from home wanting to detox from alcohol. Pt has been drinking a fifth of liquor daily x 1.5 years. Last drink one hour ago. Hx of withdrawal seizures.

## 2021-02-21 NOTE — ED Provider Triage Note (Signed)
Emergency Medicine Provider Triage Evaluation Note  Blake Arnold , a 51 y.o. male  was evaluated in triage.  Pt complains of alcohol problem onset today.  He notes that he has been consuming 1/5 of liquor daily x1.5 years.  His last drink was 1 hour ago.  He notes over the past 2 days he is consumed roughly over 2 gallons of EtOH.  Patient notes he has a history of withdrawal seizures.  Patient notes that he also used cocaine (approximately 1 g with his last use yesterday). He has not tried any medications for his symptoms. Denies chest pain, shortness of breath, fever, chills, abdominal pain, nausea, vomiting, dizziness, lightheadedness.  Patient has a history of COPD.  Review of Systems  Positive: As per HPI above Negative: Chest pain, shortness of breath  Physical Exam  BP (!) 113/92 (BP Location: Left Arm)    Pulse 96    Temp 98.4 F (36.9 C) (Oral)    Resp 16    SpO2 96%  Gen:   Awake, no distress   Resp:  Normal effort  MSK:   Moves extremities without difficulty  Other:  PERRL.EOMI.  Medical Decision Making  Medically screening exam initiated at 2:09 PM.  Appropriate orders placed.  [name] was informed that the remainder of the evaluation will be completed by another provider, this initial triage assessment does not replace that evaluation, and the importance of remaining in the ED until their evaluation is complete.   Shaylin Blatt A, PA-C 02/21/21 1414

## 2021-02-21 NOTE — ED Notes (Signed)
Pt called for vitals. No response.  

## 2021-02-21 NOTE — ED Provider Notes (Signed)
[facility] EMERGENCY DEPARTMENT Provider Note   CSN: [id number] Arrival date & time: 02/21/21  1302     History  Chief Complaint  Patient presents with   Alcohol Problem    Blake GRUNEWALD is a 51 y.o. male.   Alcohol Problem Pertinent negatives include no abdominal pain and no shortness of breath. Patient presents with problems with alcohol abuse.  States he is drinking 1/5 or 2 of whiskey a day.  Is been drinking like this for a while.  Patient told me 7 months apparently had told nurse a year and a half.  Had been sober for a while before.  Has a history of some chronic pain and states he has been drinking-help with the pain.  Also tends to use cocaine.  No other drug use.  Occasionally rare opiate pill.  No suicidal homicidal thoughts.  States he has had some withdrawal seizures in the past. History of rheumatoid arthritis and history of alcohol abuse.States he previously had hepatitis C but was treated     Home Medications Prior to Admission medications   Medication Sig Start Date End Date Taking? Authorizing Provider  acetaminophen (TYLENOL) 500 MG tablet Take 500-1,000 mg by mouth every 8 (eight) hours as needed for mild pain or headache.   Yes [provider]  Adalimumab (HUMIRA) 40 MG/0.8ML PSKT Inject 0.8 mLs (40 mg total) into the skin once a week. tuesdays Patient taking differently: Inject 40 mg into the skin every Sunday. 08/05/17 02/28/21 Yes Saverio Danker, PA-C  Albuterol Sulfate (PROAIR RESPICLICK) 102 (90 Base) MCG/ACT AEPB Inhale 2 puffs into the lungs every 4 (four) hours as needed (wheezing; cough). Patient taking differently: Inhale 2 puffs into the lungs every 4 (four) hours as needed (wheezing or coughing). 06/21/19  Yes [name], MD  amLODipine (NORVASC) 5 MG tablet TAKE 1 TABLET(5 MG) BY MOUTH AT BEDTIME Patient taking differently: Take 5 mg by mouth at bedtime. 12/15/19  Yes [name], DO  chlordiazePOXIDE  (LIBRIUM) 25 MG capsule 50mg  PO TID x 1D, then 25-50mg  PO BID X 1D, then 25-50mg  PO QD X 1D 02/21/21  Yes [name], MD  diclofenac Sodium (VOLTAREN) 1 % GEL Apply 1 application topically 3 (three) times daily. Patient taking differently: Apply 2 g topically 3 (three) times daily as needed (for pain- affected sites). 12/22/19  Yes Raulkar, Clide Deutscher, MD  gabapentin (NEURONTIN) 300 MG capsule Take 1 capsule (300 mg total) by mouth 3 (three) times daily. Patient taking differently: Take 600 mg by mouth 2 (two) times daily. 11/13/20  Yes Raulkar, Clide Deutscher, MD  hydrochlorothiazide (HYDRODIURIL) 25 MG tablet TAKE 1 TABLET(25 MG) BY MOUTH DAILY Patient taking differently: Take 25 mg by mouth daily. 10/19/20  Yes [name], DO  loratadine (CLARITIN) 10 MG tablet Take 10 mg by mouth daily as needed for rhinitis or allergies.   Yes [provider]  metoprolol tartrate (LOPRESSOR) 50 MG tablet TAKE 1 TABLET(50 MG) BY MOUTH TWICE DAILY Patient taking differently: Take 50 mg by mouth in the morning and at bedtime. 01/15/21  Yes Espinoza, Dawson Bills, DO  ondansetron (ZOFRAN-ODT) 4 MG disintegrating tablet Take 1 tablet (4 mg total) by mouth every 8 (eight) hours as needed for nausea or vomiting. 02/21/21  Yes [name], MD  simvastatin (ZOCOR) 40 MG tablet TAKE 1 TABLET(40 MG) BY MOUTH DAILY Patient taking differently: Take 40 mg by mouth at bedtime. 01/15/21  Yes [name], DO  amitriptyline (ELAVIL) 10 MG tablet  Take 1 tablet (10 mg total) by mouth at bedtime. Patient not taking: Reported on 02/21/2021 06/22/19   [name], MD  aspirin EC 81 MG tablet Take 1 tablet (81 mg total) by mouth daily. Patient not taking: Reported on 02/21/2021 07/15/19   [name], MD  famotidine (PEPCID) 10 MG tablet Take 1 tablet (10 mg total) by mouth 2 (two) times daily. Patient not taking: Reported on 02/21/2021 06/02/19   Bonnita Hollow, MD  methocarbamol (ROBAXIN) 500 MG  tablet Take 1 tablet (500 mg total) by mouth every 8 (eight) hours as needed for muscle spasms. Patient not taking: Reported on 02/21/2021 11/08/20   Lyndee Hensen, DO  neomycin-polymyxin-hydrocortisone (CORTISPORIN) OTIC solution Apply 1-2 drops to toe after soaking twice a day Patient not taking: Reported on 02/21/2021 10/07/19   Wallene Huh, DPM      Allergies    Lisinopril    Review of Systems   Review of Systems  Constitutional:  Negative for appetite change.  HENT:  Negative for congestion.   Respiratory:  Negative for shortness of breath.   Gastrointestinal:  Negative for abdominal pain.  Musculoskeletal:  Negative for gait problem.       Joint pain  Skin:  Negative for pallor and rash.  Neurological:  Negative for weakness.   Physical Exam Updated Vital Signs BP (!) 136/98 (BP Location: Right Arm)    Pulse 88    Temp 98.2 F (36.8 C) (Oral)    Resp 16    SpO2 99%  Physical Exam Vitals and nursing note reviewed.  HENT:     Head: Atraumatic.  Cardiovascular:     Rate and Rhythm: Regular rhythm.  Pulmonary:     Breath sounds: No wheezing.  Abdominal:     Tenderness: There is no abdominal tenderness.  Musculoskeletal:        General: No tenderness.     Cervical back: Neck supple.  Skin:    General: Skin is warm.     Capillary Refill: Capillary refill takes less than 2 seconds.  Neurological:     Mental Status: He is alert and oriented to person, place, and time.    ED Results / Procedures / Treatments   Labs (all labs ordered are listed, but only abnormal results are displayed) Labs Reviewed  COMPREHENSIVE METABOLIC PANEL - Abnormal; Notable for the following components:      Result Value   Glucose, Bld 101 (*)    Calcium 8.7 (*)    Total Protein 8.2 (*)    ALT 45 (*)    All other components within normal limits  ETHANOL - Abnormal; Notable for the following components:   Alcohol, Ethyl (B) 333 (*)    All other components within normal limits  CBC -  Abnormal; Notable for the following components:   Hemoglobin 18.3 (*)    MCH 36.1 (*)    MCHC 36.3 (*)    All other components within normal limits  RAPID URINE DRUG SCREEN, HOSP PERFORMED - Abnormal; Notable for the following components:   Cocaine POSITIVE (*)    All other components within normal limits    EKG None  Radiology No results found.  Procedures Procedures    Medications Ordered in ED Medications - No data to display  ED Course/ Medical Decision Making/ A&P                           Medical Decision Making  How long there is a year ago  Amount and/or Complexity of Data Reviewed External Data Reviewed: notes. Labs: ordered. Decision-making details documented in ED Course.   Patient presents for treatment of his alcohol use.  Drinking heavily.  Drinks whiskey.  Drinks over 1/5 a day.  Has had withdrawal seizures in the past.  No suicidal thoughts.  Homicidal thoughts.  Appears likely stable for outpatient treatment.  Lab work reassuring.  May have some dehydration but patient is tolerating orals.  Has elevated hemoglobin.  Alcohol level is 300.  Good kidney function.  We will treat symptomatically with Librium and antiemetics.  Discharge home.  Follow-up resources given         Final Clinical Impression(s) / ED Diagnoses Final diagnoses:  Alcohol intoxication with moderate or severe use disorder, uncomplicated ([place])    Rx / DC Orders ED Discharge Orders          Ordered    chlordiazePOXIDE (LIBRIUM) 25 MG capsule        02/21/21 2202    ondansetron (ZOFRAN-ODT) 4 MG disintegrating tablet  Every 8 hours PRN        02/21/21 2202              [name], MD 02/21/21 2234

## 2021-02-25 ENCOUNTER — Other Ambulatory Visit (HOSPITAL_COMMUNITY)
Admission: EM | Admit: 2021-02-25 | Discharge: 2021-02-27 | Discharge status: Against Medical Advice | Payer: Medicaid Other | Attending: Psychiatry | Admitting: Psychiatry

## 2021-02-25 DIAGNOSIS — Z20822 Contact with and (suspected) exposure to covid-19: Secondary | ICD-10-CM | POA: Diagnosis not present

## 2021-02-25 DIAGNOSIS — F419 Anxiety disorder, unspecified: Secondary | ICD-10-CM | POA: Insufficient documentation

## 2021-02-25 DIAGNOSIS — F32A Depression, unspecified: Secondary | ICD-10-CM | POA: Diagnosis not present

## 2021-02-25 DIAGNOSIS — J449 Chronic obstructive pulmonary disease, unspecified: Secondary | ICD-10-CM | POA: Insufficient documentation

## 2021-02-25 DIAGNOSIS — F439 Reaction to severe stress, unspecified: Secondary | ICD-10-CM | POA: Insufficient documentation

## 2021-02-25 DIAGNOSIS — Z8249 Family history of ischemic heart disease and other diseases of the circulatory system: Secondary | ICD-10-CM | POA: Insufficient documentation

## 2021-02-25 DIAGNOSIS — I4891 Unspecified atrial fibrillation: Secondary | ICD-10-CM | POA: Insufficient documentation

## 2021-02-25 DIAGNOSIS — Z9151 Personal history of suicidal behavior: Secondary | ICD-10-CM | POA: Insufficient documentation

## 2021-02-25 DIAGNOSIS — E785 Hyperlipidemia, unspecified: Secondary | ICD-10-CM | POA: Insufficient documentation

## 2021-02-25 DIAGNOSIS — R450 Nervousness: Secondary | ICD-10-CM | POA: Insufficient documentation

## 2021-02-25 DIAGNOSIS — F1410 Cocaine abuse, uncomplicated: Secondary | ICD-10-CM | POA: Insufficient documentation

## 2021-02-25 DIAGNOSIS — F17210 Nicotine dependence, cigarettes, uncomplicated: Secondary | ICD-10-CM | POA: Insufficient documentation

## 2021-02-25 DIAGNOSIS — I10 Essential (primary) hypertension: Secondary | ICD-10-CM | POA: Insufficient documentation

## 2021-02-25 DIAGNOSIS — Z5329 Procedure and treatment not carried out because of patient's decision for other reasons: Secondary | ICD-10-CM | POA: Insufficient documentation

## 2021-02-25 DIAGNOSIS — F1010 Alcohol abuse, uncomplicated: Secondary | ICD-10-CM | POA: Diagnosis not present

## 2021-02-25 DIAGNOSIS — M069 Rheumatoid arthritis, unspecified: Secondary | ICD-10-CM | POA: Insufficient documentation

## 2021-02-25 DIAGNOSIS — Z599 Problem related to housing and economic circumstances, unspecified: Secondary | ICD-10-CM | POA: Insufficient documentation

## 2021-02-25 DIAGNOSIS — Z79899 Other long term (current) drug therapy: Secondary | ICD-10-CM | POA: Insufficient documentation

## 2021-02-25 DIAGNOSIS — R4587 Impulsiveness: Secondary | ICD-10-CM | POA: Insufficient documentation

## 2021-02-25 LAB — URINALYSIS, COMPLETE (UACMP) WITH MICROSCOPIC
Bacteria, UA: NONE SEEN
Bilirubin Urine: NEGATIVE
Glucose, UA: NEGATIVE mg/dL
Ketones, ur: NEGATIVE mg/dL
Leukocytes,Ua: NEGATIVE
Nitrite: NEGATIVE
Protein, ur: NEGATIVE mg/dL
Specific Gravity, Urine: 1.02 (ref 1.005–1.030)
pH: 5 (ref 5.0–8.0)

## 2021-02-25 LAB — HEMOGLOBIN A1C
Hgb A1c MFr Bld: 5.3 % (ref 4.8–5.6)
Mean Plasma Glucose: 105.41 mg/dL

## 2021-02-25 LAB — COMPREHENSIVE METABOLIC PANEL
ALT: 57 U/L — ABNORMAL HIGH (ref 0–44)
AST: 43 U/L — ABNORMAL HIGH (ref 15–41)
Albumin: 4.2 g/dL (ref 3.5–5.0)
Alkaline Phosphatase: 54 U/L (ref 38–126)
Anion gap: 10 (ref 5–15)
BUN: 10 mg/dL (ref 6–20)
CO2: 27 mmol/L (ref 22–32)
Calcium: 9.9 mg/dL (ref 8.9–10.3)
Chloride: 99 mmol/L (ref 98–111)
Creatinine, Ser: 0.77 mg/dL (ref 0.61–1.24)
GFR, Estimated: >60 mL/min (ref >60)
Glucose, Bld: 75 mg/dL (ref 70–99)
Potassium: 3.9 mmol/L (ref 3.5–5.1)
Sodium: 136 mmol/L (ref 135–145)
Total Bilirubin: 1 mg/dL (ref 0.3–1.2)
Total Protein: 8.4 g/dL — ABNORMAL HIGH (ref 6.5–8.1)

## 2021-02-25 LAB — CBC WITH DIFFERENTIAL/PLATELET
Abs Immature Granulocytes: 0.05 10*3/uL (ref 0.00–0.07)
Basophils Absolute: 0.1 10*3/uL (ref 0.0–0.1)
Basophils Relative: 1 %
Eosinophils Absolute: 0.1 10*3/uL (ref 0.0–0.5)
Eosinophils Relative: 1 %
HCT: 52.2 % — ABNORMAL HIGH (ref 39.0–52.0)
Hemoglobin: 18 g/dL — ABNORMAL HIGH (ref 13.0–17.0)
Immature Granulocytes: 1 %
Lymphocytes Relative: 29 %
Lymphs Abs: 2.3 10*3/uL (ref 0.7–4.0)
MCH: 33.5 pg (ref 26.0–34.0)
MCHC: 34.5 g/dL (ref 30.0–36.0)
MCV: 97.2 fL (ref 80.0–100.0)
Monocytes Absolute: 0.4 10*3/uL (ref 0.1–1.0)
Monocytes Relative: 6 %
Neutro Abs: 5 10*3/uL (ref 1.7–7.7)
Neutrophils Relative %: 62 %
Platelets: 228 10*3/uL (ref 150–400)
RBC: 5.37 MIL/uL (ref 4.22–5.81)
RDW: 12.4 % (ref 11.5–15.5)
WBC: 8 10*3/uL (ref 4.0–10.5)
nRBC: 0 % (ref 0.0–0.2)

## 2021-02-25 LAB — POCT URINE DRUG SCREEN - MANUAL ENTRY (I-SCREEN)
POC Amphetamine UR: NOT DETECTED
POC Buprenorphine (BUP): NOT DETECTED
POC Cocaine UR: POSITIVE — AB
POC Marijuana UR: NOT DETECTED
POC Methadone UR: NOT DETECTED
POC Methamphetamine UR: NOT DETECTED
POC Morphine: NOT DETECTED
POC Oxazepam (BZO): NOT DETECTED
POC Oxycodone UR: NOT DETECTED
POC Secobarbital (BAR): NOT DETECTED

## 2021-02-25 LAB — RESP PANEL BY RT-PCR (FLU A&B, COVID) ARPGX2
Influenza A by PCR: NEGATIVE
Influenza B by PCR: NEGATIVE
SARS Coronavirus 2 by RT PCR: NEGATIVE

## 2021-02-25 LAB — LIPID PANEL
Cholesterol: 207 mg/dL — ABNORMAL HIGH (ref 0–200)
HDL: 75 mg/dL (ref >40)
LDL Cholesterol: 98 mg/dL (ref 0–99)
Total CHOL/HDL Ratio: 2.8 RATIO
Triglycerides: 172 mg/dL — ABNORMAL HIGH (ref <150)
VLDL: 34 mg/dL (ref 0–40)

## 2021-02-25 LAB — POC SARS CORONAVIRUS 2 AG -  ED: SARS Coronavirus 2 Ag: NEGATIVE

## 2021-02-25 LAB — ETHANOL: Alcohol, Ethyl (B): 14 mg/dL — ABNORMAL HIGH (ref <10)

## 2021-02-25 LAB — MAGNESIUM: Magnesium: 2.1 mg/dL (ref 1.7–2.4)

## 2021-02-25 LAB — TSH: TSH: 2.064 u[IU]/mL (ref 0.350–4.500)

## 2021-02-25 MED ORDER — HYDROCHLOROTHIAZIDE 25 MG PO TABS
25.0000 mg | ORAL_TABLET | Freq: Every day | ORAL | Status: DC
  Administered 2021-02-26: 25 mg via ORAL
  Filled 2021-02-25 (×2): qty 1

## 2021-02-25 MED ORDER — SIMVASTATIN 20 MG PO TABS
40.0000 mg | ORAL_TABLET | Freq: Every day | ORAL | Status: DC
Start: 2021-02-25 — End: 2021-02-27
  Administered 2021-02-25 – 2021-02-26 (×2): 40 mg via ORAL
  Filled 2021-02-25 (×2): qty 2

## 2021-02-25 MED ORDER — ALUM & MAG HYDROXIDE-SIMETH 200-200-20 MG/5ML PO SUSP: 30.0000 mL | ORAL | Status: DC | PRN

## 2021-02-25 MED ORDER — LORAZEPAM 1 MG PO TABS: 1.0000 mg | ORAL_TABLET | Freq: Two times a day (BID) | ORAL | Status: DC

## 2021-02-25 MED ORDER — LORAZEPAM 1 MG PO TABS
1.0000 mg | ORAL_TABLET | Freq: Four times a day (QID) | ORAL | Status: AC
  Administered 2021-02-25 – 2021-02-26 (×5): 1 mg via ORAL
  Filled 2021-02-25 (×4): qty 1

## 2021-02-25 MED ORDER — LOPERAMIDE HCL 2 MG PO CAPS: 2.0000 mg | ORAL_CAPSULE | ORAL | Status: DC | PRN

## 2021-02-25 MED ORDER — ACETAMINOPHEN 325 MG PO TABS: 650.0000 mg | ORAL_TABLET | Freq: Four times a day (QID) | ORAL | Status: DC | PRN

## 2021-02-25 MED ORDER — ONDANSETRON 4 MG PO TBDP: 4.0000 mg | ORAL_TABLET | Freq: Four times a day (QID) | ORAL | Status: DC | PRN

## 2021-02-25 MED ORDER — METOPROLOL TARTRATE 50 MG PO TABS
50.0000 mg | ORAL_TABLET | Freq: Two times a day (BID) | ORAL | Status: DC
  Administered 2021-02-25 – 2021-02-26 (×3): 50 mg via ORAL
  Filled 2021-02-25 (×4): qty 1

## 2021-02-25 MED ORDER — HYDROXYZINE HCL 25 MG PO TABS: 25.0000 mg | ORAL_TABLET | Freq: Four times a day (QID) | ORAL | Status: DC | PRN

## 2021-02-25 MED ORDER — NICOTINE 21 MG/24HR TD PT24
21.0000 mg | MEDICATED_PATCH | Freq: Every day | TRANSDERMAL | Status: DC
  Filled 2021-02-25 (×2): qty 1

## 2021-02-25 MED ORDER — MAGNESIUM HYDROXIDE 400 MG/5ML PO SUSP: 30.0000 mL | Freq: Every day | ORAL | Status: DC | PRN

## 2021-02-25 MED ORDER — LORAZEPAM 1 MG PO TABS
1.0000 mg | ORAL_TABLET | Freq: Three times a day (TID) | ORAL | Status: DC
  Administered 2021-02-26 – 2021-02-27 (×2): 1 mg via ORAL
  Filled 2021-02-25 (×3): qty 1

## 2021-02-25 MED ORDER — TRAZODONE HCL 50 MG PO TABS: 50.0000 mg | ORAL_TABLET | Freq: Every evening | ORAL | Status: DC | PRN

## 2021-02-25 MED ORDER — LORAZEPAM 1 MG PO TABS: 1.0000 mg | ORAL_TABLET | Freq: Every day | ORAL | Status: DC

## 2021-02-25 MED ORDER — ALBUTEROL SULFATE HFA 108 (90 BASE) MCG/ACT IN AERS: 2.0000 | INHALATION_SPRAY | RESPIRATORY_TRACT | Status: DC | PRN

## 2021-02-25 MED ORDER — THIAMINE HCL 100 MG PO TABS
100.0000 mg | ORAL_TABLET | Freq: Every day | ORAL | Status: DC
  Administered 2021-02-26 – 2021-02-27 (×2): 100 mg via ORAL
  Filled 2021-02-25 (×2): qty 1

## 2021-02-25 MED ORDER — AMLODIPINE BESYLATE 5 MG PO TABS
5.0000 mg | ORAL_TABLET | Freq: Every day | ORAL | Status: DC
  Administered 2021-02-25 – 2021-02-26 (×2): 5 mg via ORAL
  Filled 2021-02-25 (×2): qty 1

## 2021-02-25 MED ORDER — LORAZEPAM 1 MG PO TABS: 1.0000 mg | ORAL_TABLET | Freq: Four times a day (QID) | ORAL | Status: DC | PRN

## 2021-02-25 MED ORDER — THIAMINE HCL 100 MG/ML IJ SOLN
100.0000 mg | Freq: Once | INTRAMUSCULAR | Status: AC
  Administered 2021-02-25: 100 mg via INTRAMUSCULAR
  Filled 2021-02-25: qty 2

## 2021-02-25 MED ORDER — GABAPENTIN 300 MG PO CAPS
600.0000 mg | ORAL_CAPSULE | Freq: Two times a day (BID) | ORAL | Status: DC
  Administered 2021-02-25 – 2021-02-27 (×4): 600 mg via ORAL
  Filled 2021-02-25 (×4): qty 2

## 2021-02-25 MED ORDER — ADULT MULTIVITAMIN W/MINERALS CH
1.0000 | ORAL_TABLET | Freq: Every day | ORAL | Status: DC
  Administered 2021-02-25 – 2021-02-27 (×3): 1 via ORAL
  Filled 2021-02-25 (×3): qty 1
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
## 2021-02-25 NOTE — ED Notes (Signed)
Pt is alert and oriented x4.  Sitting in [facility] 135 awaiting placement on FBC.  Pt was given Chef salad and fluids.  Denies SI, HI or AVH at this time. Medications given and skin assessment performed. Will continue to monitor until time to take to [facility].

## 2021-02-25 NOTE — BH Assessment (Signed)
Pt to [facility] asking for detox from alcohol. Reports last drink was a 40oz beer a hour prior to coming to [facility]. Denies SI, HI, AVH. No current withdrawal symptoms but when he is not drinking he reports shaking, sweating, nausea, feeling lightheaded, some diarrhea. Hx of seizure.  Reports drinking a fifth of liquor daily for the past two years. Pt reports cocaine use about 2 days ago.   Pt is routine.

## 2021-02-25 NOTE — ED Provider Notes (Signed)
Behavioral Health Admission [facility])  Date: 02/25/21 Patient Name: Blake Arnold MRN: [id number] Chief Complaint:  Chief Complaint  Patient presents with   Alcohol Problem   Addiction Problem      Diagnoses:  Final diagnoses:  Alcohol abuse    HPI: patient presented to [facility] as a walk in alone with complaints of "I need detox"  Blake Arnold, 51 y.o., male patient seen face to face by this provider, consulted with Dr. Dwyane Dee; and chart reviewed on 02/25/21.  Patient reports he has a history of depression, alcohol and cocaine abuse.  Reports he has a medical history of hypertension, Afib (controlled), rheumatoid arthritis.  He reports multiple admissions due to alcohol abuse and detox.  Reports 1 psychiatric admission related to [place] .  He has participated in residential substance abuse treatment, including family services at the [place] and [facility] does not take any psychotropic medications. Reports he has been on medications in the past but cannot remember the names, he is also tried naltrexone in the past for alcohol abuse.  He lives with his father.  He is currently in a relationship.  He works full-time as a Therapist, occupational.  States he drinks alcohol after he gets off work.  Patient reports a long history of alcohol and substance use that started when he was 51 years old.  He had a 6-year period of sobriety but relapsed 1.5 years ago. He consumes 1/5 of liquor a day and at times drinks beer as well.  His last drink was 1 hour before coming to [facility], he drank 40 ounces of beer.  When he is drinking he often uses cocaine, roughly $20 a day.  Patient reports he thinks he had a seizure when withdrawing from alcohol roughly 10-12 years ago.  He was taken to the hospital, cleared and discharged the same day.  He often goes through spells where he stops drinking for 2 to 3 days and does not report any seizure-like activity during those times of withdrawal.  Reports he spoke  with his boss today and he helped encourage him to make the decision to come in for alcohol detox.  During evaluation Blake Arnold is in sitting position in no acute distress.  He makes good eye contact.  Speech is clear, coherent, normal rate and tone.  He is fairly groomed.  He is anxious.  He endorses depression.  Denies any concerns with sleep "as long as I am drinking".  Denies any concerns with appetite.  He does not appear to be responding to internal/external stimuli.  He denies AVH.  Denies paranoid or delusional thought content.  He denies SI/HI.  He denies any health concerns at this time.  He denies any withdrawal symptoms at this time.   Discussed admission to [facility].  Explained the milieu and expectations.  Discussed lab work, COVID testing, and EKG.  Patient agreed     PHQ 2-9:  [place] Office Visit from 11/08/2020 in [place] Office Visit from 01/14/2020 in [place] Office Visit from 09/08/2019 in [place]  Thoughts that you would be better off dead, or of hurting yourself in some way Not at all Not at all Not at all  PHQ-9 Total Score 3 6 0       [place] [facility] from 02/21/2021 in [place] No Risk  Total Time spent with patient: 30 minutes  Musculoskeletal  Strength & Muscle Tone: within normal limits Gait & Station: normal Patient leans: N/A  Psychiatric Specialty Exam  Presentation General Appearance: Casual  Eye Contact:Good  Speech:Clear and Coherent; Normal Rate  Speech Volume:Normal  Handedness:Right   Mood and Affect  Mood:Anxious  Affect:Congruent   Thought Process  Thought Processes:Coherent  Descriptions of Associations:Intact  Orientation:Full (Time, Place and Person)  Thought Content:Logical    Hallucinations:Hallucinations: None  Ideas of Reference:None  Suicidal  Thoughts:Suicidal Thoughts: No  Homicidal Thoughts:Homicidal Thoughts: No   Sensorium  Memory:Immediate Good; Recent Good; Remote Good  Judgment:Fair  Insight:Fair   Executive Functions  Concentration:Good  Attention Span:Good  [place] of Knowledge:Good  Language:Good   Psychomotor Activity  Psychomotor Activity:Psychomotor Activity: Normal   Assets  Assets:Communication Skills; Desire for Improvement; Financial Resources/Insurance; Housing; Physical Health; Resilience; Social Support; Vocational/Educational   Sleep  Sleep:Sleep: Fair   No data recorded  Physical Exam Vitals and nursing note reviewed.  Constitutional:      General: He is not in acute distress.    Appearance: Normal appearance. He is well-developed.  HENT:     Head: Normocephalic and atraumatic.  Eyes:     General:        Right eye: No discharge.        Left eye: No discharge.     Conjunctiva/sclera: Conjunctivae normal.  Cardiovascular:     Rate and Rhythm: Normal rate.  Pulmonary:     Effort: Pulmonary effort is normal. No respiratory distress.  Musculoskeletal:        General: No swelling.     Cervical back: Neck supple.  Skin:    Coloration: Skin is not jaundiced or pale.  Neurological:     Mental Status: He is alert and oriented to person, place, and time.  Psychiatric:        Attention and Perception: Attention and perception normal.        Mood and Affect: Mood is anxious.        Speech: Speech normal.        Behavior: Behavior normal. Behavior is cooperative.        Thought Content: Thought content normal.        Cognition and Memory: Cognition normal.        Judgment: Judgment normal.   Review of Systems  Constitutional: Negative.   HENT: Negative.    Eyes: Negative.   Respiratory: Negative.    Cardiovascular: Negative.        History of Afib, but states it is controlled.   Musculoskeletal: Negative.        History of RA, takes [place] injection   Skin:  Negative.   Neurological: Negative.   Psychiatric/Behavioral:  Positive for substance abuse. The patient is nervous/anxious.    Blood pressure (!) 146/91, pulse 75, temperature 98.3 F (36.8 C), temperature source Oral, resp. rate 18, SpO2 95 %. There is no height or weight on file to calculate BMI.  Past Psychiatric History: substance abuse, depression    Is the patient at risk to self? No  Has the patient been a risk to self in the past 6 months? No .    Has the patient been a risk to self within the distant past? Yes   Is the patient a risk to others? No   Has the patient been a risk to others in the past 6 months? No   Has the patient been a risk  to others within the distant past? No   Past Medical History:  Past Medical History:  Diagnosis Date   A-fib (HCC)    Acid reflux    Alcohol abuse    Anxiety    Asthma    AVN (avascular necrosis of bone) (HCC)    Chronic back pain    COPD (chronic obstructive pulmonary disease) (HCC)    Depression    Hepatitis C    history of   History of urinary frequency    Hypertension    Peripheral neuropathy    hands and feet   Polysubstance abuse ([place]) 01/25/2011   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[place])    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[place])    Seizures ([place])     Past Surgical History:  Procedure Laterality Date   APPENDECTOMY     CARDIOVERSION  03/07/2006   CARPAL TUNNEL RELEASE     CERVICAL FUSION     LAPAROSCOPIC APPENDECTOMY N/A 07/23/2017   Procedure: APPENDECTOMY LAPAROSCOPIC;  Surgeon: Georganna Skeans, MD;  Location: [facility];  Service: General;  Laterality: N/A;   TOTAL HIP ARTHROPLASTY Right 2016    Family History:  Family History  Problem Relation Age of Onset   Heart attack Mother    Atrial fibrillation Mother    Skin cancer Father    Colon polyps Neg Hx    Esophageal cancer Neg Hx    Stomach cancer Neg Hx    Rectal cancer Neg Hx     Social History:  Social History   Socioeconomic History   Marital  status: Single    Spouse name: Not on file   Number of children: Not on file   Years of education: Not on file   Highest education level: Not on file  Occupational History   Not on file  Tobacco Use   Smoking status: Every Day    Packs/day: 2.00    Years: 34.00    Pack years: 68.00    Types: Cigarettes    Start date: 02/12/1984   Smokeless tobacco: Former    Quit date: 10/24/2012   Tobacco comments:    1.5-2ppd  Quit attempt 12/24/2018  Vaping Use   Vaping Use: Never used  Substance and Sexual Activity   Alcohol use: Yes    Comment: socially   Drug use: Not Currently    Types: "Crack" cocaine, Cocaine    Comment: last use 07/26/13   Sexual activity: Never  Other Topics Concern   Not on file  Social History Narrative   Not on file   Social Determinants of Health   Financial Resource Strain: Not on file  Food Insecurity: Not on file  Transportation Needs: Not on file  Physical Activity: Not on file  Stress: Not on file  Social Connections: Not on file  Intimate Partner Violence: Not on file    SDOH:  SDOH Screenings   Alcohol Screen: Not on file  Depression (PHQ2-9): Low Risk    PHQ-2 Score: 0  Financial Resource Strain: Not on file  Food Insecurity: Not on file  Housing: Not on file  Physical Activity: Not on file  Social Connections: Not on file  Stress: Not on file  Tobacco Use: High Risk   Smoking Tobacco Use: Every Day   Smokeless Tobacco Use: Former   Passive Exposure: Not on file  Transportation Needs: Not on file    Last Labs:  Admission on 02/21/2021, Discharged on 02/21/2021  Component Date Value Ref Range Status   Sodium 02/21/2021 138  135 -  145 mmol/L Final   Potassium 02/21/2021 4.0  3.5 - 5.1 mmol/L Final   Chloride 02/21/2021 104  98 - 111 mmol/L Final   CO2 02/21/2021 23  22 - 32 mmol/L Final   Glucose, Bld 02/21/2021 101 (H)  70 - 99 mg/dL Final   Glucose reference range applies only to samples taken after fasting  for at least 8 hours.   BUN 02/21/2021 9  6 - 20 mg/dL Final   Creatinine, Ser 02/21/2021 0.76  0.61 - 1.24 mg/dL Final   Calcium 02/21/2021 8.7 (L)  8.9 - 10.3 mg/dL Final   Total Protein 02/21/2021 8.2 (H)  6.5 - 8.1 g/dL Final   Albumin 02/21/2021 3.9  3.5 - 5.0 g/dL Final   AST 02/21/2021 26  15 - 41 U/L Final   ALT 02/21/2021 45 (H)  0 - 44 U/L Final   Alkaline Phosphatase 02/21/2021 57  38 - 126 U/L Final   Total Bilirubin 02/21/2021 0.5  0.3 - 1.2 mg/dL Final   GFR, Estimated 02/21/2021 >60  >60 mL/min Final   Comment: (NOTE) Calculated using the CKD-EPI Creatinine Equation (2021)    Anion gap 02/21/2021 11  5 - 15 Final   Performed at [facility] [address], [place], [place] [address]   Alcohol, Ethyl (B) 02/21/2021 333 (HH)  <10 mg/dL Final   Comment: CRITICAL RESULT CALLED TO, READ BACK BY AND VERIFIED WITH: ROWE,C RN @ 1455 02/21/21 LEONARD,A (NOTE) Lowest detectable limit for serum alcohol is 10 mg/dL.  For medical purposes only. Performed at [facility], [place] [address] [place], [place] [address]    WBC 02/21/2021 10.2  4.0 - 10.5 K/uL Final   RBC 02/21/2021 5.07  4.22 - 5.81 MIL/uL Final   Hemoglobin 02/21/2021 18.3 (H)  13.0 - 17.0 g/dL Final   HCT 02/21/2021 50.4  39.0 - 52.0 % Final   MCV 02/21/2021 99.4  80.0 - 100.0 fL Final   MCH 02/21/2021 36.1 (H)  26.0 - 34.0 pg Final   MCHC 02/21/2021 36.3 (H)  30.0 - 36.0 g/dL Final   RDW 02/21/2021 12.8  11.5 - 15.5 % Final   Platelets 02/21/2021 256  150 - 400 K/uL Final   nRBC 02/21/2021 0.0  0.0 - 0.2 % Final   Performed at [facility], [place] [address], [place], [place] [address]   Opiates 02/21/2021 NONE DETECTED  NONE DETECTED Final   Cocaine 02/21/2021 POSITIVE (A)  NONE DETECTED Final   Benzodiazepines 02/21/2021 NONE DETECTED  NONE DETECTED Final   Amphetamines 02/21/2021 NONE DETECTED  NONE DETECTED Final   Tetrahydrocannabinol 02/21/2021 NONE DETECTED  NONE  DETECTED Final   Barbiturates 02/21/2021 NONE DETECTED  NONE DETECTED Final   Comment: (NOTE) DRUG SCREEN FOR MEDICAL PURPOSES ONLY.  IF CONFIRMATION IS NEEDED FOR ANY PURPOSE, NOTIFY LAB WITHIN 5 DAYS.  LOWEST DETECTABLE LIMITS FOR URINE DRUG SCREEN Drug Class                     Cutoff (ng/mL) Amphetamine and metabolites    1000 Barbiturate and metabolites    200 Benzodiazepine                 664 Tricyclics and metabolites     300 Opiates and metabolites        300 Cocaine and metabolites        300 THC  50 Performed at [facility], [place] [address] [place], [place] [address]     Allergies: Lisinopril  PTA Medications: (Not in a hospital admission)   Medical Decision Making  Patient presents to [facility] seeking alcohol detox.  He meets criteria for admission and treatment in the [facility].  Patient agreed.    Recommendations  Based on my evaluation the patient does not appear to have an emergency medical condition.  Admit patient to [facility].  Ativan detox with taper ordered. Lab work ordered: CBC with differential CMP ethanol, hemoglobin A1c, lipid panel, magnesium, RPR, TSH, U/A, UDS, GC chlamydia, COVID POC and PCR.   EKG ordered   Revonda Humphrey, NP 02/25/21  3:09 PM

## 2021-02-25 NOTE — Progress Notes (Signed)
Pt is admitted to [facility] due to alcohol abuse and needing detox. Pt is alert and oriented. Pt is ambulatory and is oriented to staff/unit. Pt reported a fall three days ago due to being intoxicated. Pt is deemed a high fall risk and was educated on fall prevention. Pt was cooperative with skin assessment and admission process. Pt reported chronic pain due to arthritis. Pt denies current SI/HI/AVH. Staff will monitor for pt's safety.

## 2021-02-26 ENCOUNTER — Encounter (HOSPITAL_COMMUNITY): Payer: Self-pay | Admitting: Psychiatry

## 2021-02-26 DIAGNOSIS — Z20822 Contact with and (suspected) exposure to covid-19: Secondary | ICD-10-CM | POA: Diagnosis not present

## 2021-02-26 DIAGNOSIS — F1010 Alcohol abuse, uncomplicated: Secondary | ICD-10-CM | POA: Diagnosis not present

## 2021-02-26 DIAGNOSIS — F32A Depression, unspecified: Secondary | ICD-10-CM | POA: Diagnosis not present

## 2021-02-26 DIAGNOSIS — F1410 Cocaine abuse, uncomplicated: Secondary | ICD-10-CM | POA: Diagnosis not present

## 2021-02-26 LAB — GC/CHLAMYDIA PROBE AMP (~~LOC~~) NOT AT ARMC
Chlamydia: NEGATIVE
Comment: NEGATIVE
Comment: NORMAL
Neisseria Gonorrhea: NEGATIVE

## 2021-02-26 LAB — RPR: RPR Ser Ql: NONREACTIVE

## 2021-02-26 NOTE — ED Notes (Signed)
Pt sleeping@this time. Breathing even and unlabored. Will continue to monitor for safety 

## 2021-02-26 NOTE — ED Notes (Signed)
Patient A&Ox4. Denies intent to harm self/others when asked. Denies A/VH. Patient denies any physical complaints when asked. No acute distress noted. Support and encouragement provided. Routine safety checks conducted according to facility protocol. Encouraged patient to notify staff if thoughts of harm toward self or others arise. Patient verbalize understanding and agreement. Patient remains safe and patient verbally contracts for safety at this time. Will continue to monitor.

## 2021-02-26 NOTE — ED Notes (Signed)
Pt resting in no acute distress. RR even and unlabored. Safety maintained. 

## 2021-02-26 NOTE — ED Notes (Signed)
Patient is pleasant and cooperative. He is watching TV in the day room with other patients. He had no complaints. He appears very sad but enjoyed being with other patients watching TV. Will continue to monitor

## 2021-02-26 NOTE — Clinical Social Work Psych Note (Signed)
LCSW Initial Note    LCSW met with Blake Arnold for introduction and to begin discussions regarding treatment and potential discharge planning.    Blake Arnold reports he came to the [facility] seeking assistance for ETOH abuse and detox services.   Blake Arnold shared that he has struggled with ETOH for many years. He reports he began drinking ETOH at the age of 24.   Blake Arnold expressed that he was only requesting detox services. He shared that he plans to return home with his father and to work as soon as he completes his Ativan taper protocol for detox services, at the [facility].   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 He described his job as "stressful" and that he plans to look for another job in the near future.    Blake Arnold denied having any additional questions, concerns or identified needs at this time.    CSW will continue to follow.      Radonna Ricker, MSW, LCSW Clinical Education officer, museum ([place]) [facility]

## 2021-02-26 NOTE — ED Notes (Signed)
Snacks given 

## 2021-02-26 NOTE — Group Note (Signed)
Group Topic: Wellness  Group Date: 02/26/2021 Start Time: 1000 End Time: [place] Facilitators: Laury Axon E  Department: [facility]  Number of Participants: 4  Group Focus: activities of daily living skills, coping skills, and relaxation Treatment Modality:  Individual Therapy Interventions utilized were mental fitness and patient education Purpose: enhance coping skills and trigger / craving management  Name: Blake Arnold Date of Birth: February 07, 1971  MR: [id number]    Level of Participation: active Quality of Participation: attentive and cooperative Interactions with others: gave feedback Mood/Affect: appropriate Triggers (if applicable): n/a Cognition: coherent/clear and concrete Progress: Moderate Response: n/a Plan: follow-up needed  Patients Problems:  Patient Active Problem List   Diagnosis Date Noted   Chronic pain 11/09/2020   History of COVID-19 07/03/2020   Tooth abscess 01/14/2020   Tongue lesion 10/11/2019   Rash 09/11/2019   Carpal tunnel syndrome 08/24/2019   Onychomycosis 08/24/2019   Cervicalgia 06/02/2019   Left hip pain 04/06/2019   Abdominal pain 02/28/2019   Shoulder pain 12/17/2018   Hyperlipidemia 09/01/2018   HTN (hypertension) 07/24/2017   Seizure ([place]) 07/24/2017   RA (rheumatoid arthritis) ([place]) 07/24/2017   Numbness in feet 03/04/2016   Degenerative joint disease 12/28/2015   OSA (obstructive sleep apnea) 11/23/2015   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

## 2021-02-26 NOTE — ED Notes (Signed)
Patient denies [place].  Patient is cooperative and interacts well with staff. Respiratory is even and unlabored. No distress noted. Patient resting in bed at present. Patient stated no complaints at present. will continue to monitor for safety.

## 2021-02-26 NOTE — ED Notes (Signed)
Patient resting quietly with eyes closed. No S/S of distress. Respirations even and unlabored. Will continue to monitor for safety.

## 2021-02-26 NOTE — ED Provider Notes (Signed)
Behavioral Health Progress Note  Date and Time: 02/26/2021 3:03 PM Name: Blake Arnold MRN:  [id number]  Subjective:   51 year old male with history of alcohol abuse, depression, cocaine use who presented to the [facility] on 1/1 5 for assistance with alcohol detox. Patient was started on ativan detox protocol upon admission. Most recent CIWA 0.   Patient seen and chart reviewed-has been medication compliant and appropriate with staff and peers on the unit.  Patient discusses his alcohol use in great detail this afternoon.  Patient reports that he had been sober for about 6 years although that although did relapse approximately 1.5 years ago.  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  Patient discusses in detail his job interview can be extremely stressful.  Patient denies SI/HI/AVH.  Patient states that he would like detox services and is not currently interested in residential rehab.  Patient states that he has to resume working in order to pay his bills.  Patient states that he plans on looking for a new job upon discharge due to the stressful nature of his current job.  Patient states he has a very supportive family and a supportive fiance who currently lives in [place].  Patient expresses that he would like a letter for work upon discharge.  Patient reports alcohol withdrawal symptoms of mild hemic headache and mild tremor.  He denies nausea, vomiting, GI upset, diaphoresis.  He also reports mild constipation.  Discussed with patient that his Ativan taper ends on 1/19 Thursday.  Patient verbalized understanding and was in agreement to stay until completion of the taper.  Patient was given the opportunity to ask questions and  All questions answered. Patient verbalized understanding regarding plan of care.   Diagnosis:  Final diagnoses:   Alcohol abuse    Total Time spent with patient: 30 minutes  Past Psychiatric History: depression, alcohol use Past Medical History:  Past Medical History:  Diagnosis Date   A-fib (HCC)    Acid reflux    Alcohol abuse    Anxiety    Asthma    AVN (avascular necrosis of bone) (HCC)    Chronic back pain    COPD (chronic obstructive pulmonary disease) (HCC)    Depression    Hepatitis C    history of   History of urinary frequency    Hypertension    Peripheral neuropathy    hands and feet   Polysubstance abuse ([place]) 01/25/2011   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[place])    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[place])    Seizures ([place])     Past Surgical History:  Procedure Laterality Date   APPENDECTOMY     CARDIOVERSION  03/07/2006   CARPAL TUNNEL RELEASE     CERVICAL FUSION     LAPAROSCOPIC APPENDECTOMY N/A 07/23/2017   Procedure: APPENDECTOMY LAPAROSCOPIC;  Surgeon: Georganna Skeans, MD;  Location: [place];  Service: General;  Laterality: N/A;   TOTAL HIP ARTHROPLASTY Right 2016   Family History:  Family History  Problem Relation Age of Onset   Heart attack Mother    Atrial fibrillation Mother    Skin cancer Father    Colon polyps Neg Hx    Esophageal cancer Neg Hx    Stomach cancer Neg Hx    Rectal cancer Neg Hx    Family Psychiatric  History: none reported Social History:  Social  History   Substance and Sexual Activity  Alcohol Use Yes   Comment: socially     Social History   Substance and Sexual Activity  Drug Use Not Currently   Types: "Crack" cocaine, Cocaine   Comment: last use 07/26/13    Social History   Socioeconomic History   Marital status: Single    Spouse name: Not on file   Number of children: Not on file   Years of education: Not on file   Highest education level: Not on file  Occupational History   Not on file  Tobacco Use   Smoking status: Every Day    Packs/day: 2.00    Years: 34.00    Pack years: 68.00    Types: Cigarettes    Start date: 02/12/1984    Smokeless tobacco: Former    Quit date: 10/24/2012   Tobacco comments:    1.5-2ppd  Quit attempt 12/24/2018  Vaping Use   Vaping Use: Never used  Substance and Sexual Activity   Alcohol use: Yes    Comment: socially   Drug use: Not Currently    Types: "Crack" cocaine, Cocaine    Comment: last use 07/26/13   Sexual activity: Never  Other Topics Concern   Not on file  Social History Narrative   Not on file   Social Determinants of Health   Financial Resource Strain: Not on file  Food Insecurity: Not on file  Transportation Needs: Not on file  Physical Activity: Not on file  Stress: Not on file  Social Connections: Not on file   SDOH:  SDOH Screenings   Alcohol Screen: Not on file  Depression (PHQ2-9): Low Risk    PHQ-2 Score: 0  Financial Resource Strain: Not on file  Food Insecurity: Not on file  Housing: Not on file  Physical Activity: Not on file  Social Connections: Not on file  Stress: Not on file  Tobacco Use: High Risk   Smoking Tobacco Use: Every Day   Smokeless Tobacco Use: Former   Passive Exposure: Not on file  Transportation Needs: Not on file   Additional Social History:    Pain Medications: denies abuse Prescriptions: denies abuse Over the Counter: denies abuse History of alcohol / drug use?: Yes Longest period of sobriety (when/how long): 1 1/2 in 2012 Negative Consequences of Use: Financial, Personal relationships, Work / Youth worker Withdrawal Symptoms: Tremors, Irritability, Seizures, Cramps, Sweats, Diarrhea, Nausea / Vomiting Name of Substance 1: ETOH 1 - Age of First Use: 15 1 - Amount (size/oz): fifth of liquor 1 - Frequency: daily 1 - Duration: past two years 1 - Last Use / Amount: 02/25/21-40oz beer Name of Substance 2: cocaine 2 - Last Use / Amount: 2 days ago                Sleep: Fair  Appetite:  Good  Current Medications:  Current Facility-Administered Medications  Medication Dose Route Frequency Provider Last Rate Last  Admin   acetaminophen (TYLENOL) tablet 650 mg  650 mg Oral Q6H PRN Revonda Humphrey, NP       albuterol (VENTOLIN HFA) 108 (90 Base) MCG/ACT inhaler 2 puff  2 puff Inhalation Q4H PRN Revonda Humphrey, NP       alum & mag hydroxide-simeth (MAALOX/MYLANTA) 200-200-20 MG/5ML suspension 30 mL  30 mL Oral Q4H PRN Revonda Humphrey, NP       amLODipine (NORVASC) tablet 5 mg  5 mg Oral QHS Revonda Humphrey, NP   5 mg at  02/25/21 2121   gabapentin (NEURONTIN) capsule 600 mg  600 mg Oral BID Revonda Humphrey, NP   600 mg at 02/26/21 0941   hydrochlorothiazide (HYDRODIURIL) tablet 25 mg  25 mg Oral Daily Revonda Humphrey, NP   25 mg at 02/26/21 0941   hydrOXYzine (ATARAX) tablet 25 mg  25 mg Oral Q6H PRN Revonda Humphrey, NP       loperamide (IMODIUM) capsule 2-4 mg  2-4 mg Oral PRN Revonda Humphrey, NP       LORazepam (ATIVAN) tablet 1 mg  1 mg Oral Q6H PRN Revonda Humphrey, NP       LORazepam (ATIVAN) tablet 1 mg  1 mg Oral QID Revonda Humphrey, NP   1 mg at 02/26/21 5102   Followed by   LORazepam (ATIVAN) tablet 1 mg  1 mg Oral TID Revonda Humphrey, NP       Followed by   Derrill Memo ON 02/27/2021] LORazepam (ATIVAN) tablet 1 mg  1 mg Oral BID Revonda Humphrey, NP       Followed by   Derrill Memo ON 03/01/2021] LORazepam (ATIVAN) tablet 1 mg  1 mg Oral Daily Revonda Humphrey, NP       magnesium hydroxide (MILK OF MAGNESIA) suspension 30 mL  30 mL Oral Daily PRN Revonda Humphrey, NP       metoprolol tartrate (LOPRESSOR) tablet 50 mg  50 mg Oral BID Revonda Humphrey, NP   50 mg at 02/26/21 0941   multivitamin with minerals tablet 1 tablet  1 tablet Oral Daily Revonda Humphrey, NP   1 tablet at 02/26/21 0941   nicotine (NICODERM CQ - dosed in mg/24 hours) patch 21 mg  21 mg Transdermal Q0600 Revonda Humphrey, NP       ondansetron (ZOFRAN-ODT) disintegrating tablet 4 mg  4 mg Oral Q6H PRN Revonda Humphrey, NP       simvastatin (ZOCOR) tablet 40 mg  40 mg Oral QHS Thomes Lolling H,  NP   40 mg at 02/25/21 2121   thiamine tablet 100 mg  100 mg Oral Daily Revonda Humphrey, NP   100 mg at 02/26/21 0941   traZODone (DESYREL) tablet 50 mg  50 mg Oral QHS PRN Revonda Humphrey, NP       Current Outpatient Medications  Medication Sig Dispense Refill   acetaminophen (TYLENOL) 500 MG tablet Take 500-1,000 mg by mouth every 8 (eight) hours as needed for mild pain or headache.     Adalimumab (HUMIRA) 40 MG/0.8ML PSKT Inject 0.8 mLs (40 mg total) into the skin once a week. tuesdays (Patient taking differently: Inject 40 mg into the skin every Sunday.) 3.2 mL 11   Albuterol Sulfate (PROAIR RESPICLICK) 585 (90 Base) MCG/ACT AEPB Inhale 2 puffs into the lungs every 4 (four) hours as needed (wheezing; cough). (Patient taking differently: Inhale 2 puffs into the lungs every 4 (four) hours as needed (wheezing or coughing).) 1 each 0   amLODipine (NORVASC) 5 MG tablet TAKE 1 TABLET(5 MG) BY MOUTH AT BEDTIME (Patient taking differently: Take 5 mg by mouth at bedtime.) 90 tablet 3   diclofenac Sodium (VOLTAREN) 1 % GEL Apply 1 application topically 3 (three) times daily. (Patient taking differently: Apply 2 g topically 3 (three) times daily as needed (for pain- affected sites).) 2 g 3   gabapentin (NEURONTIN) 300 MG capsule Take 1 capsule (300 mg total) by mouth 3 (three) times daily. (Patient taking differently: Take  600 mg by mouth 2 (two) times daily.) 90 capsule 3   hydrochlorothiazide (HYDRODIURIL) 25 MG tablet TAKE 1 TABLET(25 MG) BY MOUTH DAILY (Patient taking differently: Take 25 mg by mouth daily.) 90 tablet 1   loratadine (CLARITIN) 10 MG tablet Take 10 mg by mouth daily as needed for rhinitis or allergies.     metoprolol tartrate (LOPRESSOR) 50 MG tablet TAKE 1 TABLET(50 MG) BY MOUTH TWICE DAILY (Patient taking differently: Take 50 mg by mouth in the morning and at bedtime.) 180 tablet 3   ondansetron (ZOFRAN-ODT) 4 MG disintegrating tablet Take 1 tablet (4 mg total) by mouth every 8  (eight) hours as needed for nausea or vomiting. 8 tablet 0   simvastatin (ZOCOR) 40 MG tablet TAKE 1 TABLET(40 MG) BY MOUTH DAILY (Patient taking differently: Take 40 mg by mouth at bedtime.) 90 tablet 0    Labs  Lab Results:  Admission on 02/25/2021  Component Date Value Ref Range Status   SARS Coronavirus 2 by RT PCR 02/25/2021 NEGATIVE  NEGATIVE Final   Comment: (NOTE) SARS-CoV-2 target nucleic acids are NOT DETECTED.  The SARS-CoV-2 RNA is generally detectable in upper respiratory specimens during the acute phase of infection. The lowest concentration of SARS-CoV-2 viral copies this assay can detect is 138 copies/mL. A negative result does not preclude SARS-Cov-2 infection and should not be used as the sole basis for treatment or other patient management decisions. A negative result may occur with  improper specimen collection/handling, submission of specimen other than nasopharyngeal swab, presence of viral mutation(s) within the areas targeted by this assay, and inadequate number of viral copies(<138 copies/mL). A negative result must be combined with clinical observations, patient history, and epidemiological information. The expected result is Negative.  Fact Sheet for Patients:  EntrepreneurPulse.com.au  Fact Sheet for Healthcare Providers:  IncredibleEmployment.be  This test is no                          t yet approved or cleared by the [place] FDA and  has been authorized for detection and/or diagnosis of SARS-CoV-2 by FDA under an Emergency Use Authorization (EUA). This EUA will remain  in effect (meaning this test can be used) for the duration of the COVID-19 declaration under Section 564(b)(1) of the Act, 21 U.S.C.section 360bbb-3(b)(1), unless the authorization is terminated  or revoked sooner.       Influenza A by PCR 02/25/2021 NEGATIVE  NEGATIVE Final   Influenza B by PCR 02/25/2021 NEGATIVE  NEGATIVE Final    Comment: (NOTE) The Xpert Xpress SARS-CoV-2/FLU/RSV plus assay is intended as an aid in the diagnosis of influenza from Nasopharyngeal swab specimens and should not be used as a sole basis for treatment. Nasal washings and aspirates are unacceptable for Xpert Xpress SARS-CoV-2/FLU/RSV testing.  Fact Sheet for Patients: EntrepreneurPulse.com.au  Fact Sheet for Healthcare Providers: IncredibleEmployment.be  This test is not yet approved or cleared by the [place] FDA and has been authorized for detection and/or diagnosis of SARS-CoV-2 by FDA under an Emergency Use Authorization (EUA). This EUA will remain in effect (meaning this test can be used) for the duration of the COVID-19 declaration under Section 564(b)(1) of the Act, 21 U.S.C. section 360bbb-3(b)(1), unless the authorization is terminated or revoked.  Performed at [facility], [place] [address] [place], [place] [address]    SARS Coronavirus 2 Ag 02/25/2021 Negative  Negative Final   WBC 02/25/2021 8.0  4.0 - 10.5  K/uL Final   RBC 02/25/2021 5.37  4.22 - 5.81 MIL/uL Final   Hemoglobin 02/25/2021 18.0 (H)  13.0 - 17.0 g/dL Final   HCT 02/25/2021 52.2 (H)  39.0 - 52.0 % Final   MCV 02/25/2021 97.2  80.0 - 100.0 fL Final   MCH 02/25/2021 33.5  26.0 - 34.0 pg Final   MCHC 02/25/2021 34.5  30.0 - 36.0 g/dL Final   RDW 02/25/2021 12.4  11.5 - 15.5 % Final   Platelets 02/25/2021 228  150 - 400 K/uL Final   nRBC 02/25/2021 0.0  0.0 - 0.2 % Final   Neutrophils Relative % 02/25/2021 62  % Final   Neutro Abs 02/25/2021 5.0  1.7 - 7.7 K/uL Final   Lymphocytes Relative 02/25/2021 29  % Final   Lymphs Abs 02/25/2021 2.3  0.7 - 4.0 K/uL Final   Monocytes Relative 02/25/2021 6  % Final   Monocytes Absolute 02/25/2021 0.4  0.1 - 1.0 K/uL Final   Eosinophils Relative 02/25/2021 1  % Final   Eosinophils Absolute 02/25/2021 0.1  0.0 - 0.5 K/uL Final   Basophils Relative 02/25/2021 1  % Final    Basophils Absolute 02/25/2021 0.1  0.0 - 0.1 K/uL Final   Immature Granulocytes 02/25/2021 1  % Final   Abs Immature Granulocytes 02/25/2021 0.05  0.00 - 0.07 K/uL Final   Performed at [facility], [place] [address] [place], [place] [address]   Sodium 02/25/2021 136  135 - 145 mmol/L Final   Potassium 02/25/2021 3.9  3.5 - 5.1 mmol/L Final   Chloride 02/25/2021 99  98 - 111 mmol/L Final   CO2 02/25/2021 27  22 - 32 mmol/L Final   Glucose, Bld 02/25/2021 75  70 - 99 mg/dL Final   Glucose reference range applies only to samples taken after fasting for at least 8 hours.   BUN 02/25/2021 10  6 - 20 mg/dL Final   Creatinine, Ser 02/25/2021 0.77  0.61 - 1.24 mg/dL Final   Calcium 02/25/2021 9.9  8.9 - 10.3 mg/dL Final   Total Protein 02/25/2021 8.4 (H)  6.5 - 8.1 g/dL Final   Albumin 02/25/2021 4.2  3.5 - 5.0 g/dL Final   AST 02/25/2021 43 (H)  15 - 41 U/L Final   ALT 02/25/2021 57 (H)  0 - 44 U/L Final   Alkaline Phosphatase 02/25/2021 54  38 - 126 U/L Final   Total Bilirubin 02/25/2021 1.0  0.3 - 1.2 mg/dL Final   GFR, Estimated 02/25/2021 >60  >60 mL/min Final   Comment: (NOTE) Calculated using the CKD-EPI Creatinine Equation (2021)    Anion gap 02/25/2021 10  5 - 15 Final   Performed at [facility] [address] [place], [place] [address]   Hgb A1c MFr Bld 02/25/2021 5.3  4.8 - 5.6 % Final   Comment: (NOTE) Pre diabetes:          5.7%-6.4%  Diabetes:              >6.4%  Glycemic control for   <7.0% adults with diabetes    Mean Plasma Glucose 02/25/2021 105.41  mg/dL Final   Performed at [facility], [place] [address], [place], [place] [address]   Magnesium 02/25/2021 2.1  1.7 - 2.4 mg/dL Final   Performed at [facility] [address], [place], [place] [address]   Alcohol, Ethyl (B) 02/25/2021 14 (H)  <10 mg/dL Final   Comment: (NOTE) Lowest detectable  limit for serum alcohol is 10 mg/dL.  For medical purposes only. Performed at [facility], [place] [address], [place], [place] [address]    Cholesterol 02/25/2021 207 (H)  0 - 200 mg/dL Final   Triglycerides 02/25/2021 172 (H)  <150 mg/dL Final   HDL 02/25/2021 75  >40 mg/dL Final   Total CHOL/HDL Ratio 02/25/2021 2.8  RATIO Final   VLDL 02/25/2021 34  0 - 40 mg/dL Final   LDL Cholesterol 02/25/2021 98  0 - 99 mg/dL Final   Comment:        Total Cholesterol/HDL:CHD Risk Coronary Heart Disease Risk Table                     Men   Women  1/2 Average Risk   3.4   3.3  Average Risk       5.0   4.4  2 X Average Risk   9.6   7.1  3 X Average Risk  23.4   11.0        Use the calculated Patient Ratio above and the CHD Risk Table to determine the patient's CHD Risk.        ATP III CLASSIFICATION (LDL):  <100     mg/dL   Optimal  100-129  mg/dL   Near or Above                    Optimal  130-159  mg/dL   Borderline  160-189  mg/dL   High  >190     mg/dL   Very High Performed at [facility] [address], [place], [place] [address]    TSH 02/25/2021 2.064  0.350 - 4.500 uIU/mL Final   Comment: Performed by a 3rd Generation assay with a functional sensitivity of <=0.01 uIU/mL. Performed at [facility], [place] [address], [place], [place] [address]    RPR Ser Ql 02/25/2021 NON REACTIVE  NON REACTIVE Final   Performed at [facility], [place] [address], [place], [place] [address]   Color, Urine 02/25/2021 YELLOW  YELLOW Final   APPearance 02/25/2021 CLEAR  CLEAR Final   Specific Gravity, Urine 02/25/2021 1.020  1.005 - 1.030 Final   pH 02/25/2021 5.0  5.0 - 8.0 Final   Glucose, UA 02/25/2021 NEGATIVE  NEGATIVE mg/dL Final   Hgb urine dipstick 02/25/2021 TRACE (A)  NEGATIVE Final   Bilirubin Urine 02/25/2021 NEGATIVE  NEGATIVE Final   Ketones, ur 02/25/2021 NEGATIVE  NEGATIVE mg/dL Final   Protein, ur 02/25/2021 NEGATIVE  NEGATIVE mg/dL Final   Nitrite 02/25/2021 NEGATIVE  NEGATIVE Final   Leukocytes,Ua 02/25/2021 NEGATIVE  NEGATIVE Final   Squamous  Epithelial / LPF 02/25/2021 0-5  0 - 5 Final   WBC, UA 02/25/2021 0-5  0 - 5 WBC/hpf Final   RBC / HPF 02/25/2021 0-5  0 - 5 RBC/hpf Final   Bacteria, UA 02/25/2021 NONE SEEN  NONE SEEN Final   Performed at [facility], [place] [address], [place], [place] [address]   POC Amphetamine UR 02/25/2021 None Detected  NONE DETECTED (Cut Off Level 1000 ng/mL) Final   POC Secobarbital (BAR) 02/25/2021 None Detected  NONE DETECTED (Cut Off Level 300 ng/mL) Final   POC Buprenorphine (BUP) 02/25/2021 None Detected  NONE DETECTED (Cut Off Level 10 ng/mL) Final   POC Oxazepam (BZO) 02/25/2021 None Detected  NONE DETECTED (Cut Off Level 300 ng/mL) Final   POC Cocaine  UR 02/25/2021 Positive (A)  NONE DETECTED (Cut Off Level 300 ng/mL) Final   POC Methamphetamine UR 02/25/2021 None Detected  NONE DETECTED (Cut Off Level 1000 ng/mL) Final   POC Morphine 02/25/2021 None Detected  NONE DETECTED (Cut Off Level 300 ng/mL) Final   POC Oxycodone UR 02/25/2021 None Detected  NONE DETECTED (Cut Off Level 100 ng/mL) Final   POC Methadone UR 02/25/2021 None Detected  NONE DETECTED (Cut Off Level 300 ng/mL) Final   POC Marijuana UR 02/25/2021 None Detected  NONE DETECTED (Cut Off Level 50 ng/mL) Final  Admission on 02/21/2021, Discharged on 02/21/2021  Component Date Value Ref Range Status   Sodium 02/21/2021 138  135 - 145 mmol/L Final   Potassium 02/21/2021 4.0  3.5 - 5.1 mmol/L Final   Chloride 02/21/2021 104  98 - 111 mmol/L Final   CO2 02/21/2021 23  22 - 32 mmol/L Final   Glucose, Bld 02/21/2021 101 (H)  70 - 99 mg/dL Final   Glucose reference range applies only to samples taken after fasting for at least 8 hours.   BUN 02/21/2021 9  6 - 20 mg/dL Final   Creatinine, Ser 02/21/2021 0.76  0.61 - 1.24 mg/dL Final   Calcium 02/21/2021 8.7 (L)  8.9 - 10.3 mg/dL Final   Total Protein 02/21/2021 8.2 (H)  6.5 - 8.1 g/dL Final   Albumin 02/21/2021 3.9  3.5 - 5.0 g/dL Final   AST 02/21/2021 26  15 - 41 U/L Final    ALT 02/21/2021 45 (H)  0 - 44 U/L Final   Alkaline Phosphatase 02/21/2021 57  38 - 126 U/L Final   Total Bilirubin 02/21/2021 0.5  0.3 - 1.2 mg/dL Final   GFR, Estimated 02/21/2021 >60  >60 mL/min Final   Comment: (NOTE) Calculated using the CKD-EPI Creatinine Equation (2021)    Anion gap 02/21/2021 11  5 - 15 Final   Performed at [facility] [address], [place], [place] [address]   Alcohol, Ethyl (B) 02/21/2021 333 (HH)  <10 mg/dL Final   Comment: CRITICAL RESULT CALLED TO, READ BACK BY AND VERIFIED WITH: ROWE,C RN @ 1455 02/21/21 LEONARD,A (NOTE) Lowest detectable limit for serum alcohol is 10 mg/dL.  For medical purposes only. Performed at [facility], [place] [address], [place], [place] [address]    WBC 02/21/2021 10.2  4.0 - 10.5 K/uL Final   RBC 02/21/2021 5.07  4.22 - 5.81 MIL/uL Final   Hemoglobin 02/21/2021 18.3 (H)  13.0 - 17.0 g/dL Final   HCT 02/21/2021 50.4  39.0 - 52.0 % Final   MCV 02/21/2021 99.4  80.0 - 100.0 fL Final   MCH 02/21/2021 36.1 (H)  26.0 - 34.0 pg Final   MCHC 02/21/2021 36.3 (H)  30.0 - 36.0 g/dL Final   RDW 02/21/2021 12.8  11.5 - 15.5 % Final   Platelets 02/21/2021 256  150 - 400 K/uL Final   nRBC 02/21/2021 0.0  0.0 - 0.2 % Final   Performed at [facility], [place] [address], [place],  [address]   Opiates 02/21/2021 NONE DETECTED  NONE DETECTED Final   Cocaine 02/21/2021 POSITIVE (A)  NONE DETECTED Final   Benzodiazepines 02/21/2021 NONE DETECTED  NONE DETECTED Final   Amphetamines 02/21/2021 NONE DETECTED  NONE DETECTED Final   Tetrahydrocannabinol 02/21/2021 NONE DETECTED  NONE DETECTED Final   Barbiturates 02/21/2021 NONE DETECTED  NONE DETECTED Final   Comment: (NOTE) DRUG SCREEN FOR MEDICAL PURPOSES ONLY.  IF  CONFIRMATION IS NEEDED FOR ANY PURPOSE, NOTIFY LAB WITHIN 5 DAYS.  LOWEST DETECTABLE LIMITS FOR URINE DRUG SCREEN Drug Class                     Cutoff (ng/mL) Amphetamine and metabolites     1000 Barbiturate and metabolites    200 Benzodiazepine                 062 Tricyclics and metabolites     300 Opiates and metabolites        300 Cocaine and metabolites        300 THC                            50 Performed at [facility], [place] [address] [place], [place] [address]     Blood Alcohol level:  Lab Results  Component Value Date   ETH 14 (H) 02/25/2021   ETH 333 (HH) 31/51/7616    Metabolic Disorder Labs: Lab Results  Component Value Date   HGBA1C 5.3 02/25/2021   MPG 105.41 02/25/2021   MPG 99.67 07/24/2017   No results found for: PROLACTIN Lab Results  Component Value Date   CHOL 207 (H) 02/25/2021   TRIG 172 (H) 02/25/2021   HDL 75 02/25/2021   CHOLHDL 2.8 02/25/2021   VLDL 34 02/25/2021   LDLCALC 98 02/25/2021   LDLCALC 98 08/26/2018    Therapeutic Lab Levels: No results found for: LITHIUM Lab Results  Component Value Date   VALPROATE 50.6 10/23/2012   VALPROATE 18.6 (L) 06/30/2012   No components found for:  CBMZ  Physical Findings   AUDIT    Flowsheet Row Admission (Discharged) from 07/26/2013 in [facility] Admission (Discharged) from 02/18/2013 in [facility] Admission (Discharged) from 11/27/2012 in [facility] Admission (Discharged) from 10/24/2012 in [facility] Admission (Discharged) from 06/08/2012 in [facility]  Alcohol Use Disorder Identification Test Final Score (AUDIT) 33 36 31 35 38      PHQ2-9    [facility] Office Visit from 01/15/2021 in [facility] Office Visit from 11/08/2020 in [facility] Office Visit from 01/14/2020 in [facility] Office Visit from 10/05/2019 in [facility] Office Visit from 09/08/2019 in [facility]  PHQ-2 Total Score 0 0 0  0 0  PHQ-9 Total Score -- 3 6 -- [facility] ED from 02/25/2021 in [facility] ED from 02/21/2021 in [facility] No Risk No Risk        Musculoskeletal  Strength & Muscle Tone: within normal limits Gait & Station: normal Patient leans: N/A  Psychiatric Specialty Exam  Presentation  General Appearance: Appropriate for Environment; Casual  Eye Contact:Good  Speech:Clear and Coherent; Normal Rate  Speech Volume:Normal  Handedness:Right   Mood and Affect  Mood:Euthymic  Affect:Appropriate   Thought Process  Thought Processes:Coherent; Linear; Goal Directed  Descriptions of Associations:Intact  Orientation:Full (Time, Place and Person)  Thought Content:WDL; Logical  Diagnosis of Schizophrenia or Schizoaffective disorder in past: No    Hallucinations:Hallucinations: None  Ideas of Reference:None  Suicidal Thoughts:Suicidal Thoughts: No  Homicidal Thoughts:Homicidal Thoughts: No   Sensorium  Memory:Immediate Good; Recent Good;  Remote Good  Judgment:Fair  Insight:Good   Executive Functions  Concentration:Good  Attention Span:Good  Recall:Good  Fund of Knowledge:Good  Language:Good   Psychomotor Activity  Psychomotor Activity:Psychomotor Activity: Normal   Assets  Assets:Communication Skills; Desire for Improvement; Financial Resources/Insurance; Housing; Physical Health; Social Support; Vocational/Educational   Sleep  Sleep:Sleep: Fair   No data recorded  Physical Exam  Physical Exam Constitutional:      Appearance: Normal appearance. He is normal weight.  HENT:     Head: Normocephalic and atraumatic.  Eyes:     Extraocular Movements: Extraocular movements intact.  Pulmonary:     Effort: Pulmonary effort is normal.  Neurological:     General: No focal deficit present.     Mental Status: He is alert and oriented to person, place,  and time.  Psychiatric:        Attention and Perception: Attention and perception normal.        Speech: Speech normal.        Behavior: Behavior normal. Behavior is cooperative.        Thought Content: Thought content normal.   Review of Systems  Constitutional:  Negative for chills and fever.  HENT:  Negative for hearing loss.   Eyes:  Negative for discharge and redness.  Respiratory:  Negative for cough.   Cardiovascular:  Negative for chest pain.  Gastrointestinal:  Positive for constipation. Negative for abdominal pain, nausea and vomiting.  Musculoskeletal:  Negative for myalgias.  Neurological:  Negative for headaches.  Psychiatric/Behavioral:  Positive for substance abuse. Negative for hallucinations and suicidal ideas.   Blood pressure 121/87, pulse 80, temperature 97.8 F (36.6 C), temperature source Oral, resp. rate 18, SpO2 96 %. There is no height or weight on file to calculate BMI.  Treatment Plan Summary: 51 year old male with history of alcohol abuse, depression, cocaine use who presented to the [facility] on 1/1 5 for assistance with alcohol detox. Patient was started on ativan detox protocol upon admission. Most recent CIWA 0.  Today patient denies SI/HI/AVH. He was started on ativan taper and has been tolerating it well-patient remains appropriate for continued admission at the [facility] for continued alcohol detox--scheduled to end 1/19.   AUD, severe -continue CIWA protocol -continue ativan detox- 1 mg QID --> 1 mg TID--> 1 mg BID--> 1 mg daily -multivitamin -thiamine   HLD Zocor 40 mg  Hypertension -norvasc 5 mg -hydrouril 25 mg   Asthma -PRN albuterol inhaler   Afib (controlled) -lopressor 50 mg BID  rheumatoid arthritis.  600 mg BID gabapentin  Nicotine dependence -nicoderm cq patch  Dispo:ongoing. SW assisting. Anticipate discharged on 1/19 after completion of taper  [name], MD 02/26/2021 3:03 PM

## 2021-02-26 NOTE — ED Notes (Signed)
Pt resting in room in no acute distress. RR even and unlabored. Safety maintained.

## 2021-02-26 NOTE — ED Notes (Signed)
Patient reported that he has pain in hips and spine that is not controlled by medication at time of medication administration . He stated that he drinks because of the pain. He is currently resting quietly with eyes closed. Will continue to monitor for safety.

## 2021-02-27 DIAGNOSIS — Z20822 Contact with and (suspected) exposure to covid-19: Secondary | ICD-10-CM | POA: Diagnosis not present

## 2021-02-27 DIAGNOSIS — F32A Depression, unspecified: Secondary | ICD-10-CM | POA: Diagnosis not present

## 2021-02-27 DIAGNOSIS — F1010 Alcohol abuse, uncomplicated: Secondary | ICD-10-CM | POA: Diagnosis not present

## 2021-02-27 DIAGNOSIS — F1410 Cocaine abuse, uncomplicated: Secondary | ICD-10-CM | POA: Diagnosis not present

## 2021-02-27 MED ORDER — ADULT MULTIVITAMIN W/MINERALS CH: 1.0000 | ORAL_TABLET | Freq: Every day | ORAL | 0 refills | Status: DC

## 2021-02-27 MED ORDER — THIAMINE HCL 100 MG PO TABS: 100.0000 mg | ORAL_TABLET | Freq: Every day | ORAL | 0 refills | Status: DC

## 2021-02-27 NOTE — ED Notes (Signed)
Pt is in the bed sleeping. Respirations are even and unlabored. No acute distress noted. Will continue to monitor for safety. 

## 2021-02-27 NOTE — Group Note (Signed)
Group Topic: Understanding Self  Group Date: 02/27/2021 Start Time: 0950 End Time: 1035 Facilitators: Leana Roe  Department: [facility]  Number of Participants: 4  Group Focus: anxiety, clarity of thought, and concentration Treatment Modality:  Cognitive Behavioral Therapy and Individual Therapy Interventions utilized were group exercise and patient education Purpose: enhance coping skills  Name: Blake Arnold Date of Birth: 1971/01/07  MR: [id number]    Level of Participation: minimal Quality of Participation: attentive Interactions with others: gave feedback Mood/Affect: appropriate Triggers (if applicable): n/a Cognition: coherent/clear and concrete Progress: Moderate Response: n/a Plan: follow-up needed  Patients Problems:  Patient Active Problem List   Diagnosis Date Noted   Chronic pain 11/09/2020   History of COVID-19 07/03/2020   Tooth abscess 01/14/2020   Tongue lesion 10/11/2019   Rash 09/11/2019   Carpal tunnel syndrome 08/24/2019   Onychomycosis 08/24/2019   Cervicalgia 06/02/2019   Left hip pain 04/06/2019   Abdominal pain 02/28/2019   Shoulder pain 12/17/2018   Hyperlipidemia 09/01/2018   HTN (hypertension) 07/24/2017   Seizure ([place]) 07/24/2017   RA (rheumatoid arthritis) ([place]) 07/24/2017   Numbness in feet 03/04/2016   Degenerative joint disease 12/28/2015   OSA (obstructive sleep apnea) 11/23/2015   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

## 2021-02-27 NOTE — ED Provider Notes (Signed)
FBC/OBS ASAP Discharge Summary  Date and Time: 02/27/2021 10:54 AM  Name: Blake Arnold  MRN:  [id number]   Discharge Diagnoses:  Final diagnoses:  Alcohol abuse    Subjective:  Patient seen and chart reviewed-he has been medication compliant, attending groups been appropriate with staff and peers on the unit.  Most recent CIWA was 0.  Prior to assessment this morning, I was informed by staff members that patient was requesting discharge.  On interview this morning, patient states that he would like to discharge this morning due to "the food" and desire to return to work.  Discussed with patient that current recommendations are that he remain in the [facility] to complete Ativan taper which is scheduled to end Thursday morning 1/19.  Patient verbalizes understanding; however, reiterates that he would like to discharge.  Patient acknowledges the risks of leaving AMA prior to completing Ativan taper including but not limited to worsening of withdrawal symptoms (headache, nausea, vomiting, tremors, GI ups, etc), seizures and confusion.  Patient states "I realize the risks.  I quit 3-4 times.  I have been through this before I know what to do and what not to do".  Patient denies current alcohol withdrawal symptoms of nausea, vomiting, headache, GI upset, tremors, diaphoresis.  He reports sleeping well.  Patient states he plans to follow up with his provider [facility] [place].  Patient requests a letter for work.  Advised patient to return to the emergency room if his alcohol withdrawal symptoms worsen to the point that he feels that he needs assistance.  Patient verbalizes understanding and is in agreement.  Patient signed AMA form.  Patient reported that he did not need any prescriptions for medications at this time.  Stay Summary:  51 year old male with history of alcohol abuse, depression, cocaine use who presented to the [facility] on 1/15 for assistance with alcohol detox. Patient was started on  ativan detox protocol upon admission.  Throughout his stay at the [facility], patient was medication compliant and was appropriate with staff and peers on the unit.  Patient was at first agreeable to remain in the [facility] until completion of alcohol detox Ativan taper schedule to and 1/19; however, patient requested discharge on 1/17.  Discussed the risks of leaving AMA and the recommendation to remain at the [facility] to continue taper-patient was able to verbalize the risks of leaving AMA and continued to request discharge.  Patient was agreeable to returning to the ED if alcohol withdrawal symptoms worsened.  Patient has not provided Ativan at time of discharge due withdrawal symptoms being mild throughout his stay-consistently low CIWA scores, patient not appearing to be in any objective sign of withdrawal.  Upon completion of this admission the [name] was both mentally and medically stable for discharge denying suicidal/homicidal ideation, symptoms that would be consistent with psychosis (AVH, IOR, paranoia, etc).   On my interview today, day of discharge, , patient is in NAD, alert, oriented, calm, cooperative, and attentive, with normal affect, speech, and behavior. Objectively, there is no evidence of psychosis/ mania (able to converse coherently, linear and goal directed thought, no RIS, no distractibility, not pre-occupied, no FOI, etc) nor depression to the point of suicidality (able to concentrate, affect full and reactive, speech normal r/v/t, no psychomotor retardation/agitation, etc).  Overall, patient appears to be at the point, in the absence of inhibiting or disinhibiting symptoms, where he can successfully move to lesser restrictive setting for care.    Total Time  spent with patient: 15 minutes  Past Psychiatric History: depression, AUD, cocaine use Past Medical History:  Past Medical History:  Diagnosis Date   A-fib (HCC)    Acid reflux    Alcohol abuse    Anxiety    Asthma    AVN  (avascular necrosis of bone) (HCC)    Chronic back pain    COPD (chronic obstructive pulmonary disease) (HCC)    Depression    Hepatitis C    history of   History of urinary frequency    Hypertension    Peripheral neuropathy    hands and feet   Polysubstance abuse ([place]) 01/25/2011   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[place])    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[place])    Seizures ([place])     Past Surgical History:  Procedure Laterality Date   APPENDECTOMY     CARDIOVERSION  03/07/2006   CARPAL TUNNEL RELEASE     CERVICAL FUSION     LAPAROSCOPIC APPENDECTOMY N/A 07/23/2017   Procedure: APPENDECTOMY LAPAROSCOPIC;  Surgeon: Georganna Skeans, MD;  Location: [facility];  Service: General;  Laterality: N/A;   TOTAL HIP ARTHROPLASTY Right 2016   Family History:  Family History  Problem Relation Age of Onset   Heart attack Mother    Atrial fibrillation Mother    Skin cancer Father    Colon polyps Neg Hx    Esophageal cancer Neg Hx    Stomach cancer Neg Hx    Rectal cancer Neg Hx    Family Psychiatric History: none reported Social History:  Social History   Substance and Sexual Activity  Alcohol Use Yes   Comment: socially     Social History   Substance and Sexual Activity  Drug Use Not Currently   Types: "Crack" cocaine, Cocaine   Comment: last use 07/26/13    Social History   Socioeconomic History   Marital status: Single    Spouse name: Not on file   Number of children: Not on file   Years of education: Not on file   Highest education level: Not on file  Occupational History   Not on file  Tobacco Use   Smoking status: Every Day    Packs/day: 2.00    Years: 34.00    Pack years: 68.00    Types: Cigarettes    Start date: 02/12/1984   Smokeless tobacco: Former    Quit date: 10/24/2012   Tobacco comments:    1.5-2ppd  Quit attempt 12/24/2018  Vaping Use   Vaping Use: Never used  Substance and Sexual Activity   Alcohol use: Yes    Comment: socially   Drug use: Not Currently    Types: "Crack"  cocaine, Cocaine    Comment: last use 07/26/13   Sexual activity: Never  Other Topics Concern   Not on file  Social History Narrative   Not on file   Social Determinants of Health   Financial Resource Strain: Not on file  Food Insecurity: Not on file  Transportation Needs: Not on file  Physical Activity: Not on file  Stress: Not on file  Social Connections: Not on file   SDOH:  SDOH Screenings   Alcohol Screen: Not on file  Depression (PHQ2-9): Low Risk    PHQ-2 Score: 0  Financial Resource Strain: Not on file  Food Insecurity: Not on file  Housing: Not on file  Physical Activity: Not on file  Social Connections: Not on file  Stress: Not on file  Tobacco Use: High Risk   Smoking Tobacco  Use: Every Day   Smokeless Tobacco Use: Former   Passive Exposure: Not on file  Transportation Needs: Not on file    Tobacco Cessation:  Prescription not provided because: n/a  Current Medications:  Current Facility-Administered Medications  Medication Dose Route Frequency Provider Last Rate Last Admin   acetaminophen (TYLENOL) tablet 650 mg  650 mg Oral Q6H PRN Revonda Humphrey, NP       albuterol (VENTOLIN HFA) 108 (90 Base) MCG/ACT inhaler 2 puff  2 puff Inhalation Q4H PRN Revonda Humphrey, NP       alum & mag hydroxide-simeth (MAALOX/MYLANTA) 200-200-20 MG/5ML suspension 30 mL  30 mL Oral Q4H PRN Revonda Humphrey, NP       amLODipine (NORVASC) tablet 5 mg  5 mg Oral QHS Thomes Lolling H, NP   5 mg at 02/26/21 2115   gabapentin (NEURONTIN) capsule 600 mg  600 mg Oral BID Revonda Humphrey, NP   600 mg at 02/27/21 1034   hydrochlorothiazide (HYDRODIURIL) tablet 25 mg  25 mg Oral Daily Revonda Humphrey, NP   25 mg at 02/26/21 0941   hydrOXYzine (ATARAX) tablet 25 mg  25 mg Oral Q6H PRN Revonda Humphrey, NP       loperamide (IMODIUM) capsule 2-4 mg  2-4 mg Oral PRN Revonda Humphrey, NP       LORazepam (ATIVAN) tablet 1 mg  1 mg Oral Q6H PRN Revonda Humphrey, NP        LORazepam (ATIVAN) tablet 1 mg  1 mg Oral TID Revonda Humphrey, NP   1 mg at 02/27/21 1033   Followed by   LORazepam (ATIVAN) tablet 1 mg  1 mg Oral BID Revonda Humphrey, NP       Followed by   Derrill Memo ON 03/01/2021] LORazepam (ATIVAN) tablet 1 mg  1 mg Oral Daily Revonda Humphrey, NP       magnesium hydroxide (MILK OF MAGNESIA) suspension 30 mL  30 mL Oral Daily PRN Revonda Humphrey, NP       metoprolol tartrate (LOPRESSOR) tablet 50 mg  50 mg Oral BID Revonda Humphrey, NP   50 mg at 02/26/21 2116   multivitamin with minerals tablet 1 tablet  1 tablet Oral Daily Revonda Humphrey, NP   1 tablet at 02/27/21 1034   nicotine (NICODERM CQ - dosed in mg/24 hours) patch 21 mg  21 mg Transdermal Q0600 Revonda Humphrey, NP       ondansetron (ZOFRAN-ODT) disintegrating tablet 4 mg  4 mg Oral Q6H PRN Revonda Humphrey, NP       simvastatin (ZOCOR) tablet 40 mg  40 mg Oral QHS Thomes Lolling H, NP   40 mg at 02/26/21 2116   thiamine tablet 100 mg  100 mg Oral Daily Revonda Humphrey, NP   100 mg at 02/27/21 1034   traZODone (DESYREL) tablet 50 mg  50 mg Oral QHS PRN Revonda Humphrey, NP       Current Outpatient Medications  Medication Sig Dispense Refill   acetaminophen (TYLENOL) 500 MG tablet Take 500-1,000 mg by mouth every 8 (eight) hours as needed for mild pain or headache.     Adalimumab (HUMIRA) 40 MG/0.8ML PSKT Inject 0.8 mLs (40 mg total) into the skin once a week. tuesdays (Patient taking differently: Inject 40 mg into the skin every Sunday.) 3.2 mL 11   Albuterol Sulfate (PROAIR RESPICLICK) 195 (90 Base) MCG/ACT AEPB Inhale 2 puffs into the  lungs every 4 (four) hours as needed (wheezing; cough). (Patient taking differently: Inhale 2 puffs into the lungs every 4 (four) hours as needed (wheezing or coughing).) 1 each 0   amLODipine (NORVASC) 5 MG tablet TAKE 1 TABLET(5 MG) BY MOUTH AT BEDTIME (Patient taking differently: Take 5 mg by mouth at bedtime.) 90 tablet 3   diclofenac  Sodium (VOLTAREN) 1 % GEL Apply 1 application topically 3 (three) times daily. (Patient taking differently: Apply 2 g topically 3 (three) times daily as needed (for pain- affected sites).) 2 g 3   gabapentin (NEURONTIN) 300 MG capsule Take 1 capsule (300 mg total) by mouth 3 (three) times daily. (Patient taking differently: Take 600 mg by mouth 2 (two) times daily.) 90 capsule 3   hydrochlorothiazide (HYDRODIURIL) 25 MG tablet TAKE 1 TABLET(25 MG) BY MOUTH DAILY (Patient taking differently: Take 25 mg by mouth daily.) 90 tablet 1   loratadine (CLARITIN) 10 MG tablet Take 10 mg by mouth daily as needed for rhinitis or allergies.     metoprolol tartrate (LOPRESSOR) 50 MG tablet TAKE 1 TABLET(50 MG) BY MOUTH TWICE DAILY (Patient taking differently: Take 50 mg by mouth in the morning and at bedtime.) 180 tablet 3   ondansetron (ZOFRAN-ODT) 4 MG disintegrating tablet Take 1 tablet (4 mg total) by mouth every 8 (eight) hours as needed for nausea or vomiting. 8 tablet 0   simvastatin (ZOCOR) 40 MG tablet TAKE 1 TABLET(40 MG) BY MOUTH DAILY (Patient taking differently: Take 40 mg by mouth at bedtime.) 90 tablet 0    PTA Medications: (Not in a hospital admission)   Musculoskeletal  Strength & Muscle Tone: within normal limits Gait & Station: normal Patient leans: N/A  Psychiatric Specialty Exam  Presentation  General Appearance: Appropriate for Environment; Casual  Eye Contact:Good  Speech:Clear and Coherent; Normal Rate  Speech Volume:Normal  Handedness:Right   Mood and Affect  Mood:Euthymic ("good")  Affect:Appropriate; Congruent   Thought Process  Thought Processes:Coherent; Goal Directed; Linear  Descriptions of Associations:Intact  Orientation:Full (Time, Place and Person)  Thought Content:WDL; Logical  Diagnosis of Schizophrenia or Schizoaffective disorder in past: No    Hallucinations:Hallucinations: None  Ideas of Reference:None  Suicidal Thoughts:Suicidal Thoughts:  No  Homicidal Thoughts:Homicidal Thoughts: No   Sensorium  Memory:Immediate Good; Recent Good; Remote Good  Judgment:Fair  Insight:Fair   Executive Functions  Concentration:Good  Attention Span:Good  [place] of Knowledge:Good  Language:Good   Psychomotor Activity  Psychomotor Activity:Psychomotor Activity: Normal   Assets  Assets:Communication Skills; Desire for Improvement; Financial Resources/Insurance; Physical Health; Resilience; Social Support; Vocational/Educational   Sleep  Sleep:Sleep: Fair   No data recorded  Physical Exam  Physical Exam Constitutional:      Appearance: Normal appearance. He is normal weight.  HENT:     Head: Normocephalic and atraumatic.  Eyes:     Extraocular Movements: Extraocular movements intact.  Cardiovascular:     Rate and Rhythm: Normal rate and regular rhythm.     Heart sounds: Normal heart sounds.  Pulmonary:     Effort: Pulmonary effort is normal.     Breath sounds: Normal breath sounds.  Abdominal:     General: Bowel sounds are normal.     Palpations: Abdomen is soft.  Neurological:     General: No focal deficit present.     Mental Status: He is alert and oriented to person, place, and time.  Psychiatric:        Attention and Perception: Attention and perception normal.  Speech: Speech normal.        Behavior: Behavior normal. Behavior is cooperative.        Thought Content: Thought content normal.   Review of Systems  Constitutional:  Negative for chills and fever.  HENT:  Negative for hearing loss.   Eyes:  Negative for discharge and redness.  Respiratory:  Negative for cough.   Cardiovascular:  Negative for chest pain.  Gastrointestinal:  Negative for abdominal pain.  Musculoskeletal:  Negative for myalgias.  Neurological:  Negative for headaches.  Psychiatric/Behavioral:  Positive for substance abuse. Negative for depression and suicidal ideas.   Blood pressure 116/81, pulse 81,  temperature 97.7 F (36.5 C), temperature source Oral, resp. rate 18, SpO2 98 %. There is no height or weight on file to calculate BMI.  Demographic Factors:  Male and Caucasian  Loss Factors: Financial problems/change in socioeconomic status  Historical Factors: Impulsivity  Risk Reduction Factors:   Sense of responsibility to family, Employed, Positive social support, and Positive coping skills or problem solving skills  Continued Clinical Symptoms:  Depression:   Comorbid alcohol abuse/dependence Recent sense of peace/wellbeing Alcohol/Substance Abuse/Dependencies Previous Psychiatric Diagnoses and Treatments Medical Diagnoses and Treatments/Surgeries  Cognitive Features That Contribute To Risk:  Thought constriction (tunnel vision)    Suicide Risk:  Minimal: No identifiable suicidal ideation.  Patients presenting with no risk factors but with morbid ruminations; may be classified as minimal risk based on the severity of the depressive symptoms  Plan Of Care/Follow-up recommendations:  Activity:  as tolerated Diet:  regular Other:     Take all medications as prescribed by his/her mental healthcare provider. Report any adverse effects and or reactions from the medicines to your outpatient provider promptly. Do not engage in alcohol and or illegal drug use while on prescription medicines. In the event of worsening symptoms, call the crisis hotline, 911 and or go to the nearest ED for appropriate evaluation and treatment of symptoms. follow-up with your primary care provider for your other medical issues, concerns and or health care needs.  Allergies as of 02/27/2021       Reactions   Lisinopril Swelling, Other (See Comments)   Swelling of the lips        Medication List     TAKE these medications    acetaminophen 500 MG tablet Commonly known as: TYLENOL Take 500-1,000 mg by mouth every 8 (eight) hours as needed for mild pain or headache.   Adalimumab 40  MG/0.8ML Pskt Commonly known as: Humira Inject 0.8 mLs (40 mg total) into the skin once a week. tuesdays What changed:  when to take this additional instructions   amLODipine 5 MG tablet Commonly known as: NORVASC TAKE 1 TABLET(5 MG) BY MOUTH AT BEDTIME What changed: See the new instructions.   diclofenac Sodium 1 % Gel Commonly known as: VOLTAREN Apply 1 application topically 3 (three) times daily. What changed:  how much to take when to take this reasons to take this   gabapentin 300 MG capsule Commonly known as: Neurontin Take 1 capsule (300 mg total) by mouth 3 (three) times daily. What changed:  how much to take when to take this   hydrochlorothiazide 25 MG tablet Commonly known as: HYDRODIURIL TAKE 1 TABLET(25 MG) BY MOUTH DAILY What changed: See the new instructions.   loratadine 10 MG tablet Commonly known as: CLARITIN Take 10 mg by mouth daily as needed for rhinitis or allergies.   metoprolol tartrate 50 MG tablet Commonly known as: LOPRESSOR TAKE  1 TABLET(50 MG) BY MOUTH TWICE DAILY What changed: See the new instructions.   multivitamin with minerals Tabs tablet Take 1 tablet by mouth daily. Start taking on: February 28, 2021   ondansetron 4 MG disintegrating tablet Commonly known as: ZOFRAN-ODT Take 1 tablet (4 mg total) by mouth every 8 (eight) hours as needed for nausea or vomiting.   ProAir RespiClick 718 (90 Base) MCG/ACT Aepb Generic drug: Albuterol Sulfate Inhale 2 puffs into the lungs every 4 (four) hours as needed (wheezing; cough). What changed: reasons to take this   simvastatin 40 MG tablet Commonly known as: ZOCOR TAKE 1 TABLET(40 MG) BY MOUTH DAILY What changed: See the new instructions.   thiamine 100 MG tablet Take 1 tablet (100 mg total) by mouth daily. Start taking on: February 28, 2021         Disposition: self care; home  [name], MD 02/27/2021, 10:54 AM

## 2021-02-27 NOTE — ED Notes (Signed)
Snacks given 

## 2021-02-27 NOTE — ED Notes (Signed)
Pt currently participating in group.  Seen speaking and interacting appropriately with others on the unit during breakfast.  Denies pain at this time.  Denies SI, HI, and AVH.  Stated this morning he is interested in discharging home.  Jolan from SW and Dr. Serafina Mitchell notified.  No new orders at this time.  Will continue to monitor for safety.

## 2021-02-27 NOTE — ED Notes (Signed)
Pt has asked to leave facility.  Provider updated.  Due to treatment not being completed pt would be leaving AMA.  AMA paperwork explained to pt and signed.  Pt called father for ride home and was discharged to the lobby.  All belongings returned upon discharge.

## 2021-02-27 NOTE — Discharge Instructions (Signed)
Take all medications as prescribed by his/her mental healthcare provider. Report any adverse effects and or reactions from the medicines to your outpatient provider promptly. Do not engage in alcohol and or illegal drug use while on prescription medicines. In the event of worsening symptoms, call the crisis hotline, 911 and or go to the nearest ED for appropriate evaluation and treatment of symptoms. follow-up with your primary care provider for your other medical issues, concerns and or health care needs.     Please come to [facility] (this facility) during walk in hours for appointment with psychiatrist for further medication management and for therapists for therapy.    Walk in hours are 8-11 AM Monday through Thursday for medication management. Therapy walk in hours are Monday-Wednesday 8 AM-1PM.   It is first come, first -serve; it is best to arrive by 7:00 AM.   On Friday from 1 pm to 4 pm for therapy intake only. Please arrive by 12:00 pm as it is  first come, first -serve.    When you arrive please go upstairs for your appointment. If you are unsure of where to go, inform the front desk that you are here for a walk in appointment and they will assist you with directions upstairs.  Address:  [address] in [place], [place] Ph: [phone]

## 2021-03-01 ENCOUNTER — Ambulatory Visit: Payer: Medicaid Other

## 2021-03-08 ENCOUNTER — Ambulatory Visit: Payer: Medicaid Other | Admitting: Physician Assistant

## 2021-03-09 ENCOUNTER — Telehealth (HOSPITAL_COMMUNITY): Payer: Self-pay | Admitting: Family Medicine

## 2021-03-09 NOTE — BH Assessment (Signed)
Care Management - Follow Up [facility] Discharges   Writer made contact with the patient. Patient reports that he has not had an alcohol to drink since being discharged from [facility].    Patient reports that he will follow up with [facility].

## 2021-03-29 ENCOUNTER — Ambulatory Visit: Payer: Medicaid Other | Admitting: Physician Assistant
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
## 2021-04-10 NOTE — Patient Instructions (Signed)
It was wonderful to see you today. ? ?Please bring ALL of your medications with you to every visit.  ? ?Today we talked about: ? ?-I am sending a prescription for the shingles vaccine. Please get this at your earliest convenience. Most common side effects of the shingles vaccine include sore arm, headache, fever, and muscle aches following the vaccine so plan accordingly.   ?-I am starting you on Naltrexone. Take 50 mg daily. ?-We will call you once we schedule your CT for lung cancer screening.  ? ?-Tobacco use is damaging to your body. It increases your risk of stroke, heart attack, lung cancer, and serious lung disease in the future. It also reduces your fertility.  ? ?Quitting tobacco is the best thing for your health but is a challenge---nicotine, a chemical in cigarettes, is highly addictive.  ? ?You can call 1 800 QUIT NOW (1-504 680 8416)---you will be connected with a Artist. They can also mail you nicotine gums, lozenges, and patches to quit.  ? ?Ask me about patches (which you wear all day) and gums (which you use when you have a craving) to help you quit.  ? ?There are safe, effective medications to help you quit-- ? ?Varencline---also called Chantix---- is the most common medication used to help people stop smoking. It starts a low dose and is increased. I recommended choosing a quit date then starting the medication 8 days before this. Side effects include mild headache, difficulty sleeping, and odd dreams. The medication is typically very well tolerated.  ? ? ? ?Bupropion---also called Zyban---- is started 1 week before your quit date. You take 1 pill for three days then increase to 1 pill twice per day. Side effects include a mild headache and anxiety---this usually goes away. Some patients experience weight loss.   ? ? ?Thank you for choosing [facility].  ? ?Please call [phone] with any questions about today's appointment. ? ?Please be sure to schedule follow up at  the front  desk before you leave today.  ? ?[name], DO ?PGY-2 Family Medicine   ?

## 2021-04-12 ENCOUNTER — Other Ambulatory Visit: Payer: Self-pay

## 2021-04-12 ENCOUNTER — Encounter: Payer: Self-pay | Admitting: Family Medicine

## 2021-04-12 ENCOUNTER — Ambulatory Visit (INDEPENDENT_AMBULATORY_CARE_PROVIDER_SITE_OTHER): Payer: Medicaid Other | Admitting: Family Medicine

## 2021-04-12 VITALS — BP 130/72 | HR 69 | Ht 69.0 in | Wt 226.8 lb

## 2021-04-12 DIAGNOSIS — R569 Unspecified convulsions: Secondary | ICD-10-CM | POA: Diagnosis not present

## 2021-04-12 DIAGNOSIS — F17200 Nicotine dependence, unspecified, uncomplicated: Secondary | ICD-10-CM | POA: Diagnosis not present

## 2021-04-12 DIAGNOSIS — Z8619 Personal history of other infectious and parasitic diseases: Secondary | ICD-10-CM | POA: Diagnosis not present

## 2021-04-12 DIAGNOSIS — F1020 Alcohol dependence, uncomplicated: Secondary | ICD-10-CM

## 2021-04-12 DIAGNOSIS — Z122 Encounter for screening for malignant neoplasm of respiratory organs: Secondary | ICD-10-CM

## 2021-04-12 MED ORDER — ZOSTER VAC RECOMB ADJUVANTED 50 MCG/0.5ML IM SUSR: 0.5000 mL | Freq: Once | INTRAMUSCULAR | 0 refills | Status: AC

## 2021-04-12 MED ORDER — NALTREXONE HCL 50 MG PO TABS: 50.0000 mg | ORAL_TABLET | Freq: Every day | ORAL | 2 refills | Status: DC

## 2021-04-12 NOTE — Assessment & Plan Note (Signed)
>>  ASSESSMENT AND PLAN FOR ALCOHOL USE DISORDER, SEVERE, DEPENDENCE (HCC) WRITTEN ON 04/12/2021  5:15 PM BY Sabino Dick, DO  Patient recently presented to [facility] on 1/15 for assistance with alcohol detox.  He was started on Ativan detox protocol upon admission.  Unfortunately, he did leave AMA on 1/17, prior to completing Ativan taper. Reports this was due to nicotine withdrawal.  Patient had consistently low CIWA scores throughout admission and did not appear to be in any objective signs of withdrawal upon leaving AMA.  Behavioral health follow up assessment performed 1/27 via telephone and patient reported no additional drinking since discharge for [facility]. However, he did start back about a week later. Drinks to keep his pain under control.  He previously had goals assessed with naltrexone and was sober for about 5 years.  He would like to start back on naltrexone. -Rx naltrexone 50 mg daily -Encouraged good support group such as AA (has not had good experiences in the past) -Encouraged going back to [facility]  -Referral to CCM placed for community resources

## 2021-04-12 NOTE — Assessment & Plan Note (Signed)
Patient recently presented to [facility] on 1/15 for assistance with alcohol detox.  He was started on Ativan detox protocol upon admission.  Unfortunately, he did leave AMA on 1/17, prior to completing Ativan taper. Reports this was due to nicotine withdrawal.  Patient had consistently low CIWA scores throughout admission and did not appear to be in any objective signs of withdrawal upon leaving AMA.  Behavioral health follow up assessment performed 1/27 via telephone and patient reported no additional drinking since discharge for [facility]. However, he did start back about a week later. Drinks to keep his pain under control.  He previously had goals assessed with naltrexone and was sober for about 5 years.  He would like to start back on naltrexone. ?-Rx naltrexone 50 mg daily ?-Encouraged good support group such as AA (has not had good experiences in the past) ?-Encouraged going back to [facility] [place]  ?-Referral to CCM placed for community resources ?

## 2021-04-17 ENCOUNTER — Other Ambulatory Visit: Payer: Self-pay

## 2021-04-17 NOTE — Patient Instructions (Signed)
Visit Information ? ?Blake Arnold was given information about Medicaid Managed Care team care coordination services as a part of their [place] Medicaid benefit. Blake Arnold did not consentto engagement with the [facility] Managed Care team.  ? ?If you are experiencing a medical emergency, please call 911 or report to your local emergency department or urgent care.  ? ?If you have a non-emergency medical problem during routine business hours, please contact your provider's office and ask to speak with a nurse.  ? ?For questions related to your [employer] [facility] health plan, please call: [phone]  OR visit the member homepage at: [email or web address] ? ?If you would like to schedule transportation through your [place] Medicaid plan, please call the following number at least 2 days in advance of your appointment: [phone] ? ?If you are experiencing a behavioral health crisis, call the [facility] at [phone]). The line is available 24 hours a day, seven days a week. ? ?If you would like help to quit smoking, call 1-800-QUIT-NOW 272-609-6955) OR Espa?ol: 1-855-D?jelo-Ya 223-302-7373) o para m?s informaci?n haga clic aqu? or Text READY to 200-400 to register via text ? ?[name] - following are the goals we discussed in your visit today:  ? Goals Addressed   ?None ?  ? ? ?The  Patient                                              has been provided with contact information for the Managed Medicaid care management team and has been advised to call with any health related questions or concerns.  ? ?[name], BSW, MHA ?[place]  ?High Risk Managed Medicaid Team  ?[phone]  ? ?Following is a copy of your plan of care:  ?There are no care plans that you recently modified to display for this patient. ?  ?
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
## 2021-05-09 ENCOUNTER — Ambulatory Visit
Admission: RE | Admit: 2021-05-09 | Discharge: 2021-05-09 | Disposition: A | Discharge status: Home / Self Care | Payer: Medicaid Other | Source: Ambulatory Visit | Attending: Family Medicine | Admitting: Family Medicine

## 2021-05-09 DIAGNOSIS — Z122 Encounter for screening for malignant neoplasm of respiratory organs: Secondary | ICD-10-CM

## 2021-05-09 DIAGNOSIS — F17200 Nicotine dependence, unspecified, uncomplicated: Secondary | ICD-10-CM
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
## 2021-06-04 NOTE — Patient Instructions (Addendum)
It was wonderful to see you today. ? ?Please bring ALL of your medications with you to every visit.  ? ?Today we talked about: ? ?-I am sending a referral to Physical Therapy. ?-I am sending a referral to Rheumatology as well.  ?-You can continue to use Voltaren gel for the pain, as well as ice/heat (whichever helps best) ?-You can alternate Tylenol and Ibuprofen.  ?-I have refilled your Albuterol.  ? ?Thank you for choosing [facility].  ? ?Please call [phone] with any questions about today's appointment. ? ?Please be sure to schedule follow up at the front  desk before you leave today.  ? ?[name], DO ?PGY-2 Family Medicine   ?

## 2021-06-05 ENCOUNTER — Encounter: Payer: Self-pay | Admitting: Family Medicine

## 2021-06-05 ENCOUNTER — Ambulatory Visit (INDEPENDENT_AMBULATORY_CARE_PROVIDER_SITE_OTHER): Payer: Medicaid Other | Admitting: Family Medicine

## 2021-06-05 VITALS — BP 145/96 | HR 73 | Wt 226.8 lb

## 2021-06-05 DIAGNOSIS — M25561 Pain in right knee: Secondary | ICD-10-CM

## 2021-06-05 DIAGNOSIS — F17200 Nicotine dependence, unspecified, uncomplicated: Secondary | ICD-10-CM | POA: Diagnosis not present

## 2021-06-05 DIAGNOSIS — F1020 Alcohol dependence, uncomplicated: Secondary | ICD-10-CM | POA: Diagnosis not present

## 2021-06-05 DIAGNOSIS — M069 Rheumatoid arthritis, unspecified: Secondary | ICD-10-CM

## 2021-06-05 DIAGNOSIS — M25511 Pain in right shoulder: Secondary | ICD-10-CM

## 2021-06-05 DIAGNOSIS — M25562 Pain in left knee: Secondary | ICD-10-CM

## 2021-06-05 DIAGNOSIS — G8929 Other chronic pain: Secondary | ICD-10-CM | POA: Diagnosis not present

## 2021-06-05 DIAGNOSIS — I10 Essential (primary) hypertension: Secondary | ICD-10-CM | POA: Diagnosis not present

## 2021-06-05 DIAGNOSIS — M545 Low back pain, unspecified: Secondary | ICD-10-CM | POA: Diagnosis not present

## 2021-06-05 MED ORDER — PROAIR RESPICLICK 108 (90 BASE) MCG/ACT IN AEPB: 2.0000 | INHALATION_SPRAY | RESPIRATORY_TRACT | 0 refills | Status: DC | PRN

## 2021-06-05 NOTE — Assessment & Plan Note (Signed)
Still smoking 1ppd. Very motivated to stop. Have set him up with Dr. Valentina Lucks for smoking cessation which he is amenable to. Reports good results with Chantix in the past- the only reason he stopped was because he thought he was having an allergic reaction to it (turns out it was bed bugs!).  ?

## 2021-06-05 NOTE — Assessment & Plan Note (Signed)
Working towards cutting back. Doing well on Natrexone and has significantly decreased his drinking since last month. He is still motivated to continue cutting back. Continue Naltrexone. Encourage AA meetings for support.  ?

## 2021-06-05 NOTE — Assessment & Plan Note (Signed)
Blood pressure slightly elevated today at 145/96.  Patient admitted to smoking before appointment. Can recheck at next visit- will not adjust medications at this time. He is asymptomatic and working towards alcohol and tobacco cessation.   ?

## 2021-06-05 NOTE — Assessment & Plan Note (Signed)
Reports that he previously followed at [facility].  He reports stopping Humira two months ago and has felt improvement in his joints. Appears he was previously on three medications for his RA. He would like to get established with a Rheumatologist in [place]. ?-Amb referral to Rheumatology  ?

## 2021-06-05 NOTE — Assessment & Plan Note (Signed)
He has baseline chronic pain in his neck, hip, back for which she sees Dr. Martha Clan. Has an upcoming appointment at the beginning of May. I don't suspect any acute fracture or injury necessitating imaging today- especially after a largely unremarkable examination. He is walking well without assistance- only slightly antalgic. He has no edema and non-tender to palpation of joints. He does have some decreased active ROM in upper extremities R>L and has tight hamstrings/quads and would benefit from increased strength training and stretching with PT. ?-Amb referral to PT ?-Continue Voltaren gel ?-Can alternate tylenol/Ibuprofen as needed for pain ?-Use ice for swelling and heat pads for pain as needed  ?

## 2021-06-05 NOTE — Assessment & Plan Note (Signed)
>>  ASSESSMENT AND PLAN FOR TOBACCO DEPENDENCE SYNDROME WRITTEN ON 06/05/2021  4:43 PM BY ESPINOZA, ALEJANDRA, DO  Still smoking 1ppd. Very motivated to stop. Have set him up with Dr. Raymondo Band for smoking cessation which he is amenable to. Reports good results with Chantix in the past- the only reason he stopped was because he thought he was having an allergic reaction to it (turns out it was bed bugs!).

## 2021-06-12 NOTE — Therapy (Incomplete)
?OUTPATIENT PHYSICAL THERAPY EVALUATION ? ? ?Patient Name: Blake Arnold ?MRN: [id number] ?DOB:09/12/1970, 51 y.o., male ?Today's Date: 06/15/2021 ? ? ? ?Past Medical History:  ?Diagnosis Date  ? A-fib ([place])   ? Acid reflux   ? Alcohol abuse   ? Anxiety   ? Asthma   ? AVN (avascular necrosis of bone) ([place])   ? Chronic back pain   ? COPD (chronic obstructive pulmonary disease) ([place])   ? Depression   ? Hepatitis C   ? history of  ? History of urinary frequency   ? Hypertension   ? Peripheral neuropathy   ? hands and feet  ? Polysubstance abuse ([place]) 01/25/2011  ? 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[place])   ? 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[place])   ? Seizures ([place])   ? ?Past Surgical History:  ?Procedure Laterality Date  ? APPENDECTOMY    ? CARDIOVERSION  03/07/2006  ? CARPAL TUNNEL RELEASE    ? CERVICAL FUSION    ? LAPAROSCOPIC APPENDECTOMY N/A 07/23/2017  ? Procedure: APPENDECTOMY LAPAROSCOPIC;  Surgeon: Georganna Skeans, MD;  Location: [facility];  Service: General;  Laterality: N/A;  ? TOTAL HIP ARTHROPLASTY Right 2016  ? ?Patient Active Problem List  ? Diagnosis Date Noted  ? History of hepatitis C 04/12/2021  ? Alcohol use disorder, severe, dependence ([place]) 04/12/2021  ? Chronic pain 11/09/2020  ? Tooth abscess 01/14/2020  ? Tongue lesion 10/11/2019  ? Carpal tunnel syndrome 08/24/2019  ? Onychomycosis 08/24/2019  ? Cervicalgia 06/02/2019  ? Left hip pain 04/06/2019  ? Abdominal pain 02/28/2019  ? Shoulder pain 12/17/2018  ? Hyperlipidemia 09/01/2018  ? HTN (hypertension) 07/24/2017  ? Seizure ([place]) 07/24/2017  ? RA (rheumatoid arthritis) ([place]) 07/24/2017  ? Numbness in feet 03/04/2016  ? Degenerative joint disease 12/28/2015  ? OSA (obstructive sleep apnea) 11/23/2015  ? 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 Alcohol abuse with alcohol-induced mood disorder ([place]) 07/26/2013  ? Bipolar disorder, unspecified ([place]) 10/28/2012  ? Alcohol dependence ([place]) 04/02/2012  ? Alcohol withdrawal ([place]) 04/02/2012  ? Major depression  04/02/2012  ? Polysubstance abuse ([place]) 01/25/2011  ? TOBACCO ABUSE 02/09/2010  ? SUBSTANCE ABUSE 02/09/2010  ? Tobacco dependence syndrome 02/09/2010  ? FIBRILLATION, ATRIAL 06/08/2009  ? ? ?PCP: [name], DO ? ?REFERRING PROVIDER: Zenia Resides, MD ? ?REFERRING DIAG:  ?Acute pain of right shoulder ?Pain in both knees ?Chronic midline low back pain without sciatica ? ?THERAPY DIAG:  ?No diagnosis found. ? ?ONSET DATE: *** ? ?SUBJECTIVE:             ?SUBJECTIVE STATEMENT: ?*** ? ?PERTINENT HISTORY:  ?*** ? ?PAIN:  ?Are you having pain? Yes:  ?NPRS scale: ***/10 ?Pain location: Right shoulder ?Pain description: *** ?Aggravating factors: *** ?Relieving factors: *** ? ?NPRS scale: ***/10 ?Pain location: Low back ?Pain description: *** ?Aggravating factors: *** ?Relieving factors: *** ? ?NPRS scale: ***/10 ?Pain location: Bilateral knees ?Pain description: *** ?Aggravating factors: *** ?Relieving factors: *** ? ?PRECAUTIONS: None ? ?WEIGHT BEARING RESTRICTIONS No ? ?FALLS:  ?Has patient fallen in last 6 months? {fallsyesno:27318} ? ?LIVING ENVIRONMENT: ?Lives with: lives with their family ? ?OCCUPATION: *** ? ?PLOF: Independent ? ?PATIENT GOALS: Pain relief and *** ? ? ?OBJECTIVE:  ?DIAGNOSTIC FINDINGS:  ?Right shoulder x-ray 01/25/2021:  ?Mild to moderate AC joint arthritic changes. Humeral head is well located. No acute fractures or acute findings. Shoulder joint appears well-maintained.  No bony abnormalities otherwise. ? ?Lumbar MRI 11/26/2020:  ?1. Mild lower lumbar degenerative disc disease without  spinal canal or neural foraminal stenosis. ?2. Annular fissure in close proximity to the exiting left L4 nerve root could serve as a source of irritation. ? ?PATIENT SURVEYS:  ?{rehab surveys:24030} ? ?SCREENING FOR RED FLAGS: ?Negative ? ?COGNITION: ?Overall cognitive status: Within functional limits for tasks assessed   ?  ?SENSATION: ?WFL ? ?MUSCLE LENGTH: ?*** ? ?POSTURE:   ?*** ? ?PALPATION: ?*** ? ?LUMBAR ROM:  ? ?Active  A/PROM  ?06/15/2021  ?Flexion   ?Extension   ?Right lateral flexion   ?Left lateral flexion   ?Right rotation   ?Left rotation   ? ?LE ROM: ? ?Active  Right ?06/15/2021 Left ?06/15/2021  ?Knee flexion    ?Knee extension    ? ?LE MMT: ? ?MMT Right ?06/15/2021 Left ?06/15/2021  ?Hip flexion    ?Hip extension    ?Hip abduction    ?Knee flexion    ?Knee extension    ? ?UE ROM:  ? ?Active ROM Right ?06/12/2021 Left ?06/12/2021  ?Shoulder flexion    ?Shoulder extension    ?Shoulder abduction    ?Shoulder internal rotation    ?Shoulder external rotation    ? ?UE MMT: ? ?MMT Right ?06/12/2021 Left ?06/12/2021  ?Shoulder flexion    ?Shoulder extension    ?Shoulder abduction    ?Shoulder internal rotation    ?Shoulder external rotation    ?Middle trapezius    ?Lower trapezius    ? ?FUNCTIONAL TESTS:  ?{Functional tests:24029} ? ?GAIT: ?Distance walked: *** ?Assistive device utilized: {Assistive devices:23999} ?Level of assistance: {Levels of assistance:24026} ?Comments: *** ? ? ?TODAY'S TREATMENT  ?*** ? ? ?PATIENT EDUCATION:  ?Education details: Exam findings, POC, HEP ?Person educated: Patient ?Education method: Explanation, Demonstration, Tactile cues, Verbal cues, and Handouts ?Education comprehension: verbalized understanding, returned demonstration, verbal cues required, tactile cues required, and needs further education ? ?HOME EXERCISE PROGRAM: ?*** ? ? ?ASSESSMENT: ?CLINICAL IMPRESSION: ?Patient is a 51 y.o. male who was seen today for physical therapy evaluation and treatment for chronic right shoulder pain, low back pain, and bilateral knee pain.  ? ? ?OBJECTIVE IMPAIRMENTS {opptimpairments:25111}.  ? ?ACTIVITY LIMITATIONS {activity limitations:25113}.  ? ?PERSONAL FACTORS {Personal factors:25162} are also affecting patient's functional outcome.  ? ? ?REHAB POTENTIAL: Fair ? ?CLINICAL DECISION MAKING: Stable/uncomplicated ? ?EVALUATION COMPLEXITY: Low ? ? ?GOALS: ?Goals reviewed  with patient? Yes ? ?SHORT TERM GOALS: Target date: 07/13/2021 ? ?Patient will be I with initial HEP in order to progress with therapy. ?Baseline: HEP provided at evaluation ?Goal status: INITIAL ? ?2.  Patient will report pain level </= *** in order to reduce functional limitations.  ?Baseline: ***/10 pain level ?Goal status: INITIAL ? ?3.  *** ?Baseline:  ?Goal status: INITIAL ? ?LONG TERM GOALS: Target date: 08/10/2021 ? ?Patient will be I with final HEP to maintain progress from PT. ?Baseline: HEP provided at evalution ?Goal status: INITIAL ? ?2.  Patient will report *** ?Baseline:  ?Goal status: INITIAL ? ?3.  *** ?Baseline:  ?Goal status: INITIAL ? ?4.  *** ?Baseline:  ?Goal status: INITIAL ? ? ?PLAN: ?PT FREQUENCY: 1-2x/week ? ?PT DURATION: 8 weeks ? ?PLANNED INTERVENTIONS: Therapeutic exercises, Therapeutic activity, Neuromuscular re-education, Balance training, Gait training, Patient/Family education, Joint manipulation, Joint mobilization, Aquatic Therapy, Dry Needling, Spinal manipulation, Spinal mobilization, Cryotherapy, Moist heat, Taping, and Manual therapy. ? ?PLAN FOR NEXT SESSION: Review HEP and progress PRN, *** ? ? ?Hilda Blades, PT, DPT, LAT, ATC ?06/15/21  1:03 PM ?Phone: [phone] ?Fax: [phone] ? ? ?

## 2021-06-16 ENCOUNTER — Ambulatory Visit: Payer: Medicaid Other | Attending: Family Medicine | Admitting: Physical Therapy

## 2021-06-17 ENCOUNTER — Ambulatory Visit (INDEPENDENT_AMBULATORY_CARE_PROVIDER_SITE_OTHER): Payer: Medicaid Other

## 2021-06-17 ENCOUNTER — Encounter (HOSPITAL_COMMUNITY): Payer: Self-pay

## 2021-06-17 ENCOUNTER — Ambulatory Visit (HOSPITAL_COMMUNITY)
Admission: EM | Admit: 2021-06-17 | Discharge: 2021-06-17 | Disposition: A | Discharge status: Home / Self Care | Payer: Medicaid Other | Attending: Emergency Medicine | Admitting: Emergency Medicine

## 2021-06-17 DIAGNOSIS — M542 Cervicalgia: Secondary | ICD-10-CM

## 2021-06-17 DIAGNOSIS — W19XXXA Unspecified fall, initial encounter: Secondary | ICD-10-CM | POA: Diagnosis not present

## 2021-06-17 DIAGNOSIS — M5412 Radiculopathy, cervical region: Secondary | ICD-10-CM | POA: Diagnosis not present

## 2021-06-17 MED ORDER — METHOCARBAMOL 500 MG PO TABS: 500.0000 mg | ORAL_TABLET | Freq: Every evening | ORAL | 0 refills | Status: DC

## 2021-06-17 MED ORDER — IBUPROFEN 800 MG PO TABS: 800.0000 mg | ORAL_TABLET | Freq: Three times a day (TID) | ORAL | 0 refills | Status: DC
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
## 2021-06-17 NOTE — Discharge Instructions (Addendum)
Robaxin was prescribed/take as directed ?Prescribed ibuprofen as needed for inflammation and pain relief/take with food ?Follow-up with neurology for further evaluation ?Return here or go to ER if you have any new or worsening symptoms such as numbness/tingling, headache/blurry vision, nausea/vomiting, confusion/altered mental status, dizziness, weakness, passing out, imbalance, etc...  ?
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
## 2021-06-18 ENCOUNTER — Ambulatory Visit: Payer: Medicaid Other | Admitting: Family Medicine

## 2021-06-18 ENCOUNTER — Encounter: Payer: Self-pay | Admitting: Family Medicine

## 2021-06-18 VITALS — BP 134/80 | HR 62 | Wt 232.6 lb

## 2021-06-18 DIAGNOSIS — M542 Cervicalgia: Secondary | ICD-10-CM | POA: Diagnosis present

## 2021-06-18 MED ORDER — GABAPENTIN 300 MG PO CAPS: 600.0000 mg | ORAL_CAPSULE | Freq: Three times a day (TID) | ORAL | 0 refills | Status: DC

## 2021-06-18 MED ORDER — KETOROLAC TROMETHAMINE 60 MG/2ML IM SOLN
60.0000 mg | Freq: Once | INTRAMUSCULAR | Status: AC
  Administered 2021-06-18: 60 mg via INTRAMUSCULAR

## 2021-06-18 NOTE — Progress Notes (Signed)
    SUBJECTIVE:   CHIEF COMPLAINT / HPI: Neck pain and left arm pain/numbness  Patient reports neck pain with left-sided arm pain and numbness.  Was seen in ED yesterday for similar concerns.  C-spine x-ray negative for acute fracture.  Notable for prior C5-C6 ACDF with evidence of hardware loosening and backing out of the one of the screws of C7, out by 7 mm.  Patient reports that he has known of a loose screw for quite some time now but this appears to be further out.  He was followed by orthopedics at [facility] but has since moved to [place].  He was given ibuprofen and methocarbamol in the ED and advised to follow-up with PCP for neurology referral.  Patient reports he has appointment Thursday, May 11 with his sports medicine doctor.  Was previously set up for PT last Saturday but was not able to go due to significant pain.  PERTINENT  PMH / PSH:  Cervical radiculopathy RA Hypertension Peripheral neuropathy DJD   OBJECTIVE:   BP 134/80   Pulse 62   Wt 232 lb 9.6 oz (105.5 kg)   SpO2 96%   BMI 34.35 kg/m    General: Alert, no acute distress Neck/Right arm exam: No gross deformity, swelling, bruising. TTP along cervical spinal muscles.  No midline/bony TTP. ROM decreased secondary to pain BUE strength 5/5.  Sensation decreased right arm  2+ equal reflexes in triceps, biceps, brachioradialis tendons. Positive spurlings. NV intact distal BUEs.    ASSESSMENT/PLAN:   Cervicalgia Toradol 60 mg IM now Resume Ibuprofen tomorrow Increased Gabapentin 600 mg TID Follow up with [facility] May 11 as scheduled Strict return precautions provided     Carollee Leitz, MD [place]

## 2021-06-18 NOTE — Patient Instructions (Addendum)
Thank you for coming to see me today. It was a pleasure.  ? ?Increase gabapentin to 600 mg 3 times a day ? ?Toradol 60 mg injection today.  Do not take any other NSAID-containing products today including ibuprofen, diclofenac, Aleve, Excedrin. ? ?Follow-up with sports medicine on Thursday as scheduled. ? ?Please follow-up with PCP in 2 weeks ? ?If you have any questions or concerns, please do not hesitate to call the office at [phone]. ? ?Best,  ? ?Carollee Leitz, MD   ? ? ?

## 2021-06-19 ENCOUNTER — Ambulatory Visit: Payer: Medicaid Other | Admitting: Pharmacist

## 2021-06-19 ENCOUNTER — Encounter: Payer: Self-pay | Admitting: Family Medicine

## 2021-06-19 NOTE — Assessment & Plan Note (Signed)
Toradol 60 mg IM now ?Resume Ibuprofen tomorrow ?Increased Gabapentin 600 mg TID ?Follow up with [facility] May 11 as scheduled ?Strict return precautions provided ?

## 2021-06-21 ENCOUNTER — Encounter: Payer: Self-pay | Admitting: Physical Medicine and Rehabilitation

## 2021-06-21 ENCOUNTER — Encounter
Payer: Medicaid Other | Attending: Physical Medicine and Rehabilitation | Admitting: Physical Medicine and Rehabilitation

## 2021-06-21 VITALS — BP 145/92 | HR 60 | Ht 69.0 in | Wt 228.6 lb

## 2021-06-21 DIAGNOSIS — M5416 Radiculopathy, lumbar region: Secondary | ICD-10-CM | POA: Insufficient documentation

## 2021-06-21 DIAGNOSIS — M4802 Spinal stenosis, cervical region: Secondary | ICD-10-CM | POA: Insufficient documentation

## 2021-06-21 DIAGNOSIS — I10 Essential (primary) hypertension: Secondary | ICD-10-CM | POA: Insufficient documentation
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
## 2021-06-24 DIAGNOSIS — G8929 Other chronic pain: Principal | ICD-10-CM

## 2021-06-24 DIAGNOSIS — M542 Cervicalgia: Principal | ICD-10-CM

## 2021-06-25 ENCOUNTER — Other Ambulatory Visit: Payer: Self-pay | Admitting: Orthopaedic Surgery

## 2021-07-02 ENCOUNTER — Ambulatory Visit: Payer: Medicaid Other | Admitting: Pharmacist

## 2021-07-02 ENCOUNTER — Ambulatory Visit
Admit: 2021-07-02 | Discharge: 2021-07-03 | Payer: PRIVATE HEALTH INSURANCE | Attending: Orthopaedic Surgery | Primary: Orthopaedic Surgery

## 2021-07-02 ENCOUNTER — Ambulatory Visit: Admit: 2021-07-02 | Discharge: 2021-07-03 | Payer: PRIVATE HEALTH INSURANCE

## 2021-07-02 DIAGNOSIS — M5412 Radiculopathy, cervical region: Principal | ICD-10-CM

## 2021-07-02 DIAGNOSIS — T84498A Other mechanical complication of other internal orthopedic devices, implants and grafts, initial encounter: Principal | ICD-10-CM

## 2021-07-16 ENCOUNTER — Other Ambulatory Visit: Payer: Self-pay | Admitting: Family Medicine

## 2021-07-16 ENCOUNTER — Ambulatory Visit: Admit: 2021-07-16 | Discharge: 2021-07-17 | Payer: PRIVATE HEALTH INSURANCE

## 2021-07-16 ENCOUNTER — Ambulatory Visit
Admit: 2021-07-16 | Discharge: 2021-07-17 | Payer: PRIVATE HEALTH INSURANCE | Attending: Speech-Language Pathologist | Primary: Speech-Language Pathologist

## 2021-07-16 ENCOUNTER — Ambulatory Visit
Admit: 2021-07-16 | Discharge: 2021-07-17 | Payer: PRIVATE HEALTH INSURANCE | Attending: Otolaryngology | Primary: Otolaryngology

## 2021-07-16 ENCOUNTER — Ambulatory Visit
Admit: 2021-07-16 | Discharge: 2021-07-17 | Payer: PRIVATE HEALTH INSURANCE | Attending: Orthopaedic Surgery | Primary: Orthopaedic Surgery

## 2021-07-16 DIAGNOSIS — M542 Cervicalgia: Principal | ICD-10-CM

## 2021-07-16 DIAGNOSIS — F17200 Nicotine dependence, unspecified, uncomplicated: Principal | ICD-10-CM

## 2021-07-16 DIAGNOSIS — R1310 Dysphagia, unspecified: Principal | ICD-10-CM

## 2021-07-16 DIAGNOSIS — G8929 Other chronic pain: Principal | ICD-10-CM

## 2021-07-16 DIAGNOSIS — R221 Localized swelling, mass and lump, neck: Principal | ICD-10-CM

## 2021-07-16 DIAGNOSIS — T84498A Other mechanical complication of other internal orthopedic devices, implants and grafts, initial encounter: Principal | ICD-10-CM

## 2021-07-17 ENCOUNTER — Encounter: Payer: Self-pay | Admitting: *Deleted

## 2021-07-18 DIAGNOSIS — F17200 Nicotine dependence, unspecified, uncomplicated: Principal | ICD-10-CM

## 2021-07-18 MED ORDER — VARENICLINE 0.5 MG (11)-1 MG (42) TABLETS IN A DOSE PACK
2 refills | 0 days | Status: CP
Start: 2021-07-18 — End: ?

## 2021-07-23 ENCOUNTER — Ambulatory Visit: Admit: 2021-07-23 | Discharge: 2021-07-24 | Payer: PRIVATE HEALTH INSURANCE

## 2021-07-24 ENCOUNTER — Ambulatory Visit: Payer: Medicaid Other | Admitting: Orthopaedic Surgery

## 2021-08-01 ENCOUNTER — Ambulatory Visit: Payer: Medicaid Other | Admitting: Family Medicine

## 2021-08-01 VITALS — BP 129/83 | HR 73 | Ht 68.0 in | Wt 225.1 lb

## 2021-08-01 DIAGNOSIS — M8700 Idiopathic aseptic necrosis of unspecified bone: Secondary | ICD-10-CM | POA: Diagnosis present

## 2021-08-01 NOTE — Patient Instructions (Signed)
It was great seeing you today!  I am sorry you are having this left hip pain.  I want you to continue over-the-counter medication such as ibuprofen or Tylenol to help with the pain.  You can use topical medication such as Voltaren gel or Biofreeze.  Placed a referral for you to see an orthopedic surgeon for possible treatment of this avascular necrosis.  If you have any questions or concerns call the clinic.  Hope you have a wonderful day!

## 2021-08-01 NOTE — Progress Notes (Signed)
ortho   SUBJECTIVE:   CHIEF COMPLAINT / HPI:   Left hip pain Patient presents for evaluation of chronic left hip pain.  He reports known avascular necrosis of the left hip which has been present since 2016.  The pain waxes and wanes but has been worsening over the last few months.  He was referred to orthopedics in the past but missed the appointments due to work.  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  Reports no new trauma to the left hip.  Takes some OTC medications.  Occasionally uses heat.   OBJECTIVE:   BP 129/83   Pulse 73   Ht '5\' 8"'$  (1.727 m)   Wt 225 lb 2 oz (102.1 kg)   SpO2 98%   BMI 34.23 kg/m   General: Pleasant 51 year old male in no acute distress Cardiac: Regular rate Respiratory: Normal for breathing, speaking in full sentences MSK: Pain with flexion, extension of the left hip, tenderness to palpation of the left hip, no warmth, erythema of the left hip.  ASSESSMENT/PLAN:   AVN (avascular necrosis of bone) ([place]) Patient with known AVN in his left hip.  It was recommended that he follow-up with orthopedics but he has missed several appointments.  We will place another referral for him to see orthopedics.  Recommended continued use of over-the-counter medications to help with the pain and discomfort.  Recommended ice, heat, topicals.  Return precautions and ED precautions given.     Concepcion Living, MD [place]

## 2021-08-02 DIAGNOSIS — M8700 Idiopathic aseptic necrosis of unspecified bone: Secondary | ICD-10-CM | POA: Insufficient documentation

## 2021-08-02 NOTE — Assessment & Plan Note (Signed)
Patient with known AVN in his left hip.  It was recommended that he follow-up with orthopedics but he has missed several appointments.  We will place another referral for him to see orthopedics.  Recommended continued use of over-the-counter medications to help with the pain and discomfort.  Recommended ice, heat, topicals.  Return precautions and ED precautions given.

## 2021-08-07 DIAGNOSIS — M542 Cervicalgia: Principal | ICD-10-CM

## 2021-08-08 DIAGNOSIS — S129XXA Fracture of neck, unspecified, initial encounter: Principal | ICD-10-CM

## 2021-08-08 DIAGNOSIS — T84498A Other mechanical complication of other internal orthopedic devices, implants and grafts, initial encounter: Principal | ICD-10-CM

## 2021-08-08 MED ORDER — CHLORHEXIDINE GLUCONATE 4 % TOPICAL LIQUID
Freq: Two times a day (BID) | TOPICAL | 0 refills | 3 days | Status: CP
Start: 2021-08-08 — End: 2021-08-11

## 2021-08-13 ENCOUNTER — Ambulatory Visit (INDEPENDENT_AMBULATORY_CARE_PROVIDER_SITE_OTHER): Payer: Medicaid Other

## 2021-08-13 ENCOUNTER — Encounter: Payer: Self-pay | Admitting: Physician Assistant

## 2021-08-13 ENCOUNTER — Ambulatory Visit (INDEPENDENT_AMBULATORY_CARE_PROVIDER_SITE_OTHER): Payer: Medicaid Other | Admitting: Physician Assistant

## 2021-08-13 DIAGNOSIS — M25552 Pain in left hip: Secondary | ICD-10-CM

## 2021-08-13 MED ORDER — LIDOCAINE HCL 1 % IJ SOLN
3.0000 mL | INTRAMUSCULAR | Status: AC | PRN
  Administered 2021-08-13: 3 mL

## 2021-08-13 MED ORDER — METHYLPREDNISOLONE ACETATE 40 MG/ML IJ SUSP
40.0000 mg | INTRAMUSCULAR | Status: AC | PRN
  Administered 2021-08-13: 40 mg via INTRA_ARTICULAR
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
## 2021-08-31 ENCOUNTER — Ambulatory Visit: Admit: 2021-08-31 | Discharge: 2021-08-31 | Payer: PRIVATE HEALTH INSURANCE

## 2021-08-31 ENCOUNTER — Ambulatory Visit
Admit: 2021-08-31 | Discharge: 2021-08-31 | Payer: PRIVATE HEALTH INSURANCE | Attending: Orthopaedic Surgery | Primary: Orthopaedic Surgery

## 2021-08-31 DIAGNOSIS — M5412 Radiculopathy, cervical region: Principal | ICD-10-CM

## 2021-09-03 NOTE — Progress Notes (Unsigned)
    SUBJECTIVE:   CHIEF COMPLAINT / HPI:   Patient reports that he is undergoing spinal surgery in August. He is meeting with his anesthesiologist today and having blood work done. He is working on alcohol and smoking cessation prior to surgery.   Alcohol Use Disorder Patient has been taking Naltrexone on and off- not daily. Has not had any alcohol in 3 days. He feels that when he does drink, it is difficult to stop. He has had success at alcohol cessation in the past but states that it normally is after being on Naltrexone for a year.  Tobacco Use Disorder He is smoking 1 ppd. States that he is waiting for insurance authorization to go through for his Chantix. He is interested in nicotine patches. He has tried lozenges and gum in the past but they taste awful. He has not yet called the smoking cessation hotline. States he had to reschedule his appointment with Dr. Valentina Lucks for smoking cessation as well. In the past he has smoked up to 2 packs per day, so he has been able to cut back some.   PERTINENT  PMH / PSH:  COPD, depression, polysubstance use   OBJECTIVE:   BP 138/80   Pulse 62   Ht '5\' 8"'$  (1.727 m)   Wt 227 lb (103 kg)   SpO2 98%   BMI 34.52 kg/m    General: NAD, pleasant, able to participate in exam Cardiac: RRR, no murmurs. Respiratory: CTAB, normal effort, No wheezes, rales or rhonchi Extremities: no edema or cyanosis. Skin: warm and dry, no rashes noted Psych: Normal affect and mood     09/04/2021    8:33 AM 08/01/2021   11:10 AM 06/18/2021    2:40 PM  Depression screen PHQ 2/9  Decreased Interest 0 0 0  Down, Depressed, Hopeless 0 0 0  PHQ - 2 Score 0 0 0  Altered sleeping '2 2 2  '$ Tired, decreased energy '2 3 2  '$ Change in appetite 0 0 0  Feeling bad or failure about yourself  0 0 0  Trouble concentrating 0 0 2  Moving slowly or fidgety/restless 0 0 0  Suicidal thoughts 0 0 0  PHQ-9 Score '4 5 6  '$ Difficult doing work/chores  Somewhat difficult       ASSESSMENT/PLAN:   HTN (hypertension) BP slightly above goal at 138/80 today. Smoked prior to visit which can raise BP. Encourage smoking cessation.  -Continue current BP regimen.  TOBACCO ABUSE Provided with tobacco cessation hotline. Encouraged visit with Dr. Valentina Lucks for assistance as well, but patient would like to wait given his upcoming surgeries.  -Nicotine patch 21 mg sent in today -Info on AVS regarding tobacco cessation also provided -Encouraged cessation prior to surgery to prevent post-op complications  Alcohol dependence Has had success with Naltrexone in the past.  -Refilled Naltrexone -Encouraged AA meetings -Encouraged cessation prior to surgery      [name], [place]

## 2021-09-03 NOTE — Patient Instructions (Incomplete)
It was wonderful to see you today.  Please bring ALL of your medications with you to every visit.   Today we talked about:  -I have refilled your Naltrexone. Continue your efforts to cut back on drinking, this is especially important prior to your surgery! -I have sent in Nicotine patches to help you cut back on smoking. Use one patch a day. This is also very important to decrease any complications after surgery.  Tobacco use is damaging to your body. It increases your risk of stroke, heart attack, lung cancer, and serious lung disease in the future. It also reduces your fertility.   Quitting tobacco is the best thing for your health but is a challenge---nicotine, a chemical in cigarettes, is highly addictive.   You can call 1 800 QUIT NOW [phone])692-6959)---you will be connected with a Careers information officer. They can also mail you nicotine gums, lozenges, and patches to quit.   Ask me about patches (which you wear all day) and gums (which you use when you have a craving) to help you quit.   There are safe, effective medications to help you quit--  Varencline---also called Chantix---- is the most common medication used to help people stop smoking. It starts a low dose and is increased. I recommended choosing a quit date then starting the medication 8 days before this. Side effects include mild headache, difficulty sleeping, and odd dreams. The medication is typically very well tolerated.   Bupropion---also called Zyban---- is started 1 week before your quit date. You take 1 pill for three days then increase to 1 pill twice per day. Side effects include a mild headache and anxiety---this usually goes away. Some patients experience weight loss.      Thank you for choosing [facility].   Please call [phone] with any questions about today's appointment.  Please be sure to schedule follow up at the front  desk before you leave today.   Sabino Dick, DO PGY-3 Family  Medicine

## 2021-09-04 ENCOUNTER — Encounter: Payer: Self-pay | Admitting: Family Medicine

## 2021-09-04 ENCOUNTER — Ambulatory Visit (INDEPENDENT_AMBULATORY_CARE_PROVIDER_SITE_OTHER): Payer: Medicaid Other | Admitting: Family Medicine

## 2021-09-04 ENCOUNTER — Ambulatory Visit: Admit: 2021-09-04 | Discharge: 2021-09-04 | Payer: PRIVATE HEALTH INSURANCE

## 2021-09-04 VITALS — BP 138/80 | HR 62 | Ht 68.0 in | Wt 227.0 lb

## 2021-09-04 DIAGNOSIS — J449 Chronic obstructive pulmonary disease, unspecified: Principal | ICD-10-CM

## 2021-09-04 DIAGNOSIS — S129XXA Fracture of neck, unspecified, initial encounter: Principal | ICD-10-CM

## 2021-09-04 DIAGNOSIS — M542 Cervicalgia: Principal | ICD-10-CM

## 2021-09-04 DIAGNOSIS — Z01818 Encounter for other preprocedural examination: Principal | ICD-10-CM

## 2021-09-04 DIAGNOSIS — I4891 Unspecified atrial fibrillation: Principal | ICD-10-CM

## 2021-09-04 DIAGNOSIS — T84498A Other mechanical complication of other internal orthopedic devices, implants and grafts, initial encounter: Principal | ICD-10-CM

## 2021-09-04 DIAGNOSIS — G4733 Obstructive sleep apnea (adult) (pediatric): Principal | ICD-10-CM

## 2021-09-04 DIAGNOSIS — F1020 Alcohol dependence, uncomplicated: Secondary | ICD-10-CM | POA: Diagnosis not present

## 2021-09-04 DIAGNOSIS — F17200 Nicotine dependence, unspecified, uncomplicated: Secondary | ICD-10-CM | POA: Diagnosis present

## 2021-09-04 DIAGNOSIS — I10 Essential (primary) hypertension: Secondary | ICD-10-CM

## 2021-09-04 MED ORDER — NICOTINE 21 MG/24HR TD PT24: 21.0000 mg | MEDICATED_PATCH | Freq: Every day | TRANSDERMAL | 1 refills | Status: DC

## 2021-09-04 MED ORDER — NALTREXONE HCL 50 MG PO TABS: 50.0000 mg | ORAL_TABLET | Freq: Every day | ORAL | 2 refills | Status: DC

## 2021-09-04 NOTE — Assessment & Plan Note (Signed)
Has had success with Naltrexone in the past.  -Refilled Naltrexone -Encouraged AA meetings -Encouraged cessation prior to surgery

## 2021-09-04 NOTE — Assessment & Plan Note (Signed)
BP slightly above goal at 138/80 today. Smoked prior to visit which can raise BP. Encourage smoking cessation.  -Continue current BP regimen.

## 2021-09-04 NOTE — Assessment & Plan Note (Signed)
Provided with tobacco cessation hotline. Encouraged visit with Dr. Valentina Lucks for assistance as well, but patient would like to wait given his upcoming surgeries.  -Nicotine patch 21 mg sent in today -Info on AVS regarding tobacco cessation also provided -Encouraged cessation prior to surgery to prevent post-op complications

## 2021-09-10 MED ORDER — DOCUSATE SODIUM 100 MG CAPSULE
ORAL_CAPSULE | Freq: Two times a day (BID) | ORAL | 0 refills | 7.00000 days | Status: CP
Start: 2021-09-10 — End: 2021-09-17
  Filled 2021-09-13: qty 14, 7d supply, fill #0

## 2021-09-10 MED ORDER — ONDANSETRON HCL 4 MG TABLET
ORAL_TABLET | Freq: Three times a day (TID) | ORAL | 0 refills | 5.00000 days | Status: CP | PRN
Start: 2021-09-10 — End: 2021-09-15
  Filled 2021-09-13: qty 14, 5d supply, fill #0

## 2021-09-10 MED ORDER — OXYCODONE 5 MG TABLET
ORAL_TABLET | ORAL | 0 refills | 5.00000 days | Status: CP | PRN
Start: 2021-09-10 — End: 2021-09-15
  Filled 2021-09-13: qty 30, 5d supply, fill #0

## 2021-09-12 ENCOUNTER — Encounter
Admit: 2021-09-12 | Discharge: 2021-09-13 | Disposition: A | Discharge status: Home / Self Care | Payer: PRIVATE HEALTH INSURANCE | Admitting: Orthopaedic Surgery

## 2021-09-12 ENCOUNTER — Ambulatory Visit
Admit: 2021-09-12 | Discharge: 2021-09-13 | Disposition: A | Discharge status: Home / Self Care | Payer: PRIVATE HEALTH INSURANCE | Admitting: Orthopaedic Surgery

## 2021-09-17 DIAGNOSIS — M5412 Radiculopathy, cervical region: Principal | ICD-10-CM

## 2021-09-17 DIAGNOSIS — M79603 Pain in arm, unspecified: Principal | ICD-10-CM

## 2021-09-17 DIAGNOSIS — M960 Pseudarthrosis after fusion or arthrodesis: Principal | ICD-10-CM

## 2021-09-17 MED ORDER — CHLORHEXIDINE GLUCONATE 4 % TOPICAL LIQUID
Freq: Two times a day (BID) | TOPICAL | 0 refills | 3 days | Status: CP
Start: 2021-09-17 — End: 2021-09-20

## 2021-09-24 ENCOUNTER — Encounter: Payer: Self-pay | Admitting: Family Medicine

## 2021-09-24 ENCOUNTER — Encounter
Payer: Medicaid Other | Attending: Physical Medicine and Rehabilitation | Admitting: Physical Medicine and Rehabilitation

## 2021-09-24 ENCOUNTER — Ambulatory Visit (INDEPENDENT_AMBULATORY_CARE_PROVIDER_SITE_OTHER): Payer: Medicaid Other | Admitting: Family Medicine

## 2021-09-24 ENCOUNTER — Ambulatory Visit
Admit: 2021-09-24 | Discharge: 2021-09-24 | Payer: PRIVATE HEALTH INSURANCE | Attending: Orthopaedic Surgery | Primary: Orthopaedic Surgery

## 2021-09-24 ENCOUNTER — Ambulatory Visit: Admit: 2021-09-24 | Discharge: 2021-09-24 | Payer: PRIVATE HEALTH INSURANCE

## 2021-09-24 VITALS — BP 137/84 | HR 62 | Ht 68.0 in | Wt 225.1 lb

## 2021-09-24 DIAGNOSIS — M5412 Radiculopathy, cervical region: Principal | ICD-10-CM

## 2021-09-24 DIAGNOSIS — B356 Tinea cruris: Secondary | ICD-10-CM | POA: Diagnosis not present

## 2021-09-24 DIAGNOSIS — M5416 Radiculopathy, lumbar region: Secondary | ICD-10-CM | POA: Insufficient documentation

## 2021-09-24 DIAGNOSIS — I10 Essential (primary) hypertension: Secondary | ICD-10-CM | POA: Insufficient documentation

## 2021-09-24 DIAGNOSIS — M4802 Spinal stenosis, cervical region: Secondary | ICD-10-CM | POA: Insufficient documentation

## 2021-09-24 DIAGNOSIS — R21 Rash and other nonspecific skin eruption: Secondary | ICD-10-CM

## 2021-09-24 MED ORDER — MICONAZOLE NITRATE 2 % EX CREA: 1.0000 | TOPICAL_CREAM | Freq: Two times a day (BID) | CUTANEOUS | 0 refills | Status: DC

## 2021-09-24 NOTE — Patient Instructions (Signed)
The rash is consistent with tinea cruris (jock itch) as well as some folliculitis (infection of the hair follicles).  The recommendation would be getting an over-the-counter cream such as miconazole or clotrimazole that you would use twice daily.  Continue to use this cream until 2 to 3 days after the rash has resolved.  This can take a week or 2 to go away.

## 2021-09-24 NOTE — Progress Notes (Signed)
    SUBJECTIVE:   CHIEF COMPLAINT / HPI:   Rash on right inner thigh - Recent surgery, no new activity  - Used triple antibiotic cream - Painful and burning, itching  - Started 2-3 days ago, worsened overnight - Hx of mild shingles on his back in the last few years - Small blisters that popped up in lines, not currently draining - No recent STD exposure  - Has been very tired lately, but no fevers - No issues with urinating  PERTINENT  PMH / PSH: Reviewed  OBJECTIVE:   BP 137/84   Pulse 62   Ht '5\' 8"'$  (1.727 m)   Wt 225 lb 2 oz (102.1 kg)   SpO2 97%   BMI 34.23 kg/m   General: NAD, well-appearing, well-nourished Respiratory: No respiratory distress, breathing comfortably, able to speak in full sentences Skin: warm and dry. Erythematous, hyperpigmented, dry rash present on b/l inner upper thighs with sharp demarcations. Also has some erythema and tenderness to groin hair follicles.   ASSESSMENT/PLAN:   Groin rash  Tinea Cruris Physical exam consistent with tinea cruris mixed with some folliculitis.  Do not feel this is related to shingles as patient was concerned about given the distribution as well as the appearance. - Miconazole twice daily until 2 to 3 days after rash resolves - Patient given handout on tinea cruris treatment and management - Return precautions given   Rise Patience, [place]

## 2021-09-25 ENCOUNTER — Ambulatory Visit: Payer: Medicaid Other | Admitting: Physical Medicine and Rehabilitation

## 2021-10-03 MED ORDER — ONDANSETRON HCL 4 MG TABLET
ORAL_TABLET | Freq: Three times a day (TID) | ORAL | 0 refills | 5 days | Status: CP | PRN
Start: 2021-10-03 — End: 2021-10-08

## 2021-10-03 MED ORDER — OXYCODONE 5 MG TABLET
ORAL_TABLET | ORAL | 0 refills | 5 days | Status: CP | PRN
Start: 2021-10-03 — End: 2021-10-08
  Filled 2021-10-06: qty 30, 5d supply, fill #0

## 2021-10-03 MED ORDER — DOCUSATE SODIUM 100 MG CAPSULE
ORAL_CAPSULE | Freq: Two times a day (BID) | ORAL | 0 refills | 7 days | Status: CP
Start: 2021-10-03 — End: 2021-10-10

## 2021-10-04 ENCOUNTER — Ambulatory Visit
Admit: 2021-10-04 | Discharge: 2021-10-06 | Disposition: A | Discharge status: Home / Self Care | Payer: PRIVATE HEALTH INSURANCE | Admitting: Orthopaedic Surgery

## 2021-10-04 ENCOUNTER — Encounter
Admit: 2021-10-04 | Discharge: 2021-10-06 | Disposition: A | Discharge status: Home / Self Care | Payer: PRIVATE HEALTH INSURANCE | Attending: Anesthesiology | Admitting: Orthopaedic Surgery

## 2021-10-19 ENCOUNTER — Ambulatory Visit
Admit: 2021-10-19 | Discharge: 2021-10-19 | Payer: PRIVATE HEALTH INSURANCE | Attending: Orthopaedic Surgery | Primary: Orthopaedic Surgery

## 2021-10-19 ENCOUNTER — Ambulatory Visit: Admit: 2021-10-19 | Discharge: 2021-10-19 | Payer: PRIVATE HEALTH INSURANCE

## 2021-10-19 DIAGNOSIS — G8929 Other chronic pain: Principal | ICD-10-CM

## 2021-10-19 DIAGNOSIS — M542 Cervicalgia: Principal | ICD-10-CM

## 2021-10-19 MED ORDER — OXYCODONE 5 MG TABLET
ORAL_TABLET | Freq: Four times a day (QID) | ORAL | 0 refills | 5 days | Status: CP | PRN
Start: 2021-10-19 — End: ?

## 2021-10-23 DIAGNOSIS — F17200 Nicotine dependence, unspecified, uncomplicated: Principal | ICD-10-CM

## 2021-10-23 MED ORDER — VARENICLINE 0.5 MG (11)-1 MG (42) TABLETS IN A DOSE PACK
0 refills | 0 days | Status: CP
Start: 2021-10-23 — End: 2022-01-21

## 2021-12-13 ENCOUNTER — Other Ambulatory Visit: Payer: Self-pay | Admitting: Family Medicine

## 2021-12-27 ENCOUNTER — Encounter: Payer: Medicaid Other | Admitting: Physical Medicine and Rehabilitation

## 2022-01-14 ENCOUNTER — Ambulatory Visit
Admit: 2022-01-14 | Discharge: 2022-01-14 | Payer: PRIVATE HEALTH INSURANCE | Attending: Orthopaedic Surgery | Primary: Orthopaedic Surgery

## 2022-01-14 ENCOUNTER — Ambulatory Visit: Admit: 2022-01-14 | Discharge: 2022-01-14 | Payer: PRIVATE HEALTH INSURANCE

## 2022-01-14 DIAGNOSIS — M542 Cervicalgia: Principal | ICD-10-CM

## 2022-01-14 DIAGNOSIS — G8929 Other chronic pain: Principal | ICD-10-CM

## 2022-01-21 ENCOUNTER — Ambulatory Visit (INDEPENDENT_AMBULATORY_CARE_PROVIDER_SITE_OTHER): Payer: Medicaid Other | Admitting: Family Medicine

## 2022-01-21 ENCOUNTER — Encounter: Payer: Self-pay | Admitting: Family Medicine

## 2022-01-21 VITALS — BP 152/96 | HR 51 | Temp 97.9°F | Ht 68.0 in | Wt 245.8 lb

## 2022-01-21 DIAGNOSIS — R1110 Vomiting, unspecified: Secondary | ICD-10-CM

## 2022-01-21 MED ORDER — ONDANSETRON HCL 4 MG PO TABS: 4.0000 mg | ORAL_TABLET | Freq: Three times a day (TID) | ORAL | 0 refills | Status: DC | PRN

## 2022-01-21 NOTE — Progress Notes (Unsigned)
    SUBJECTIVE:   CHIEF COMPLAINT / HPI:   Vomiting Patient had diarrhea last week- this seemingly resolved 2 days ago Last night he developed upset stomach and "sour belching" Began vomiting at 1am Had 5 episodes of vomiting. Non bloody Scared to try eating but is tolerating liquids Took a dose of Maalox with slight improvement States he's been around kids (family) who are sick with stomach viruses Denies current nausea, no further belching/gas today No significant abdominal pain, no chest pain, some dyspnea which he reports is chronic and unchanged, no URI symptoms, no headache. Had 2 normal bowel movements today H/o alcohol use: last drink 3 days ago, denies any withdrawal symptoms, taking Naltrexone and cutting back (1/5 liquor per week)  PERTINENT  PMH / PSH: Alcohol use disorder, tobacco use  OBJECTIVE:   BP (!) 152/96   Pulse (!) 51   Temp 97.9 F (36.6 C)   Ht '5\' 8"'$  (1.727 m)   Wt 245 lb 12.8 oz (111.5 kg)   SpO2 99%   BMI 37.37 kg/m   Gen: NAD, able to participate in exam HEENT: oropharynx unremarkable CV: RRR, normal S1/S2, no murmur Resp: Normal effort, lungs CTAB GI: abdomen soft, NTND Extremities: no edema or cyanosis Skin: warm and dry, no rashes noted Neuro: alert, no obvious focal deficits Psych: Normal affect and mood   ASSESSMENT/PLAN:   Vomiting Patient here with 1 day of non-bloody emesis. Suspect viral gastroenteritis given recent diarrhea and +sick contacts. He is well-appearing and well-hydrated with reassuring abdominal exam. Rx sent for Zofran prn. Advised supportive care and adequate hydration. Discussed return precautions at length, especially in light of his co morbidities.   Alcus Dad, MD [place]

## 2022-01-21 NOTE — Patient Instructions (Addendum)
It was great to meet you!  I have sent some Zofran to your pharmacy to use as needed for nausea/vomiting.  It's ok if you're not eating much over the next few days but try to stay hydrated with water or pedialyte. Gatorade is fine too.  Please let us know right away if you notice blood in your vomit, chest pain, bloating that doesn't go away, if you stop having bowel movements but continue vomiting, or if you have any other concerns.  Take care, Dr Rock Nephew

## 2022-02-12 ENCOUNTER — Encounter (HOSPITAL_COMMUNITY): Payer: Self-pay

## 2022-02-12 ENCOUNTER — Ambulatory Visit (HOSPITAL_COMMUNITY)
Admission: RE | Admit: 2022-02-12 | Discharge: 2022-02-12 | Disposition: A | Discharge status: Home / Self Care | Payer: Medicaid Other | Source: Ambulatory Visit | Attending: Internal Medicine | Admitting: Internal Medicine

## 2022-02-12 VITALS — BP 137/87 | HR 84 | Temp 99.1°F | Resp 20

## 2022-02-12 DIAGNOSIS — R051 Acute cough: Secondary | ICD-10-CM

## 2022-02-12 DIAGNOSIS — J209 Acute bronchitis, unspecified: Secondary | ICD-10-CM

## 2022-02-12 DIAGNOSIS — R062 Wheezing: Secondary | ICD-10-CM | POA: Diagnosis not present

## 2022-02-12 MED ORDER — PROMETHAZINE-DM 6.25-15 MG/5ML PO SYRP: 5.0000 mL | ORAL_SOLUTION | Freq: Every evening | ORAL | 0 refills | Status: DC | PRN

## 2022-02-12 MED ORDER — ALBUTEROL SULFATE HFA 108 (90 BASE) MCG/ACT IN AERS: 1.0000 | INHALATION_SPRAY | Freq: Four times a day (QID) | RESPIRATORY_TRACT | 0 refills | Status: DC | PRN

## 2022-02-12 MED ORDER — METHYLPREDNISOLONE SODIUM SUCC 125 MG IJ SOLR
80.0000 mg | Freq: Once | INTRAMUSCULAR | Status: AC
  Administered 2022-02-12: 80 mg via INTRAMUSCULAR

## 2022-02-12 MED ORDER — BENZONATATE 100 MG PO CAPS: 100.0000 mg | ORAL_CAPSULE | Freq: Three times a day (TID) | ORAL | 0 refills | Status: DC

## 2022-02-12 MED ORDER — METHYLPREDNISOLONE SODIUM SUCC 125 MG IJ SOLR
INTRAMUSCULAR | Status: AC
  Filled 2022-02-12: qty 2

## 2022-02-12 MED ORDER — IPRATROPIUM-ALBUTEROL 0.5-2.5 (3) MG/3ML IN SOLN
RESPIRATORY_TRACT | Status: AC
  Filled 2022-02-12: qty 3

## 2022-02-12 MED ORDER — IPRATROPIUM-ALBUTEROL 0.5-2.5 (3) MG/3ML IN SOLN
3.0000 mL | Freq: Once | RESPIRATORY_TRACT | Status: AC
  Administered 2022-02-12: 3 mL via RESPIRATORY_TRACT

## 2022-02-12 NOTE — ED Provider Notes (Signed)
[place]    CSN: [id number] Arrival date & time: 02/12/22  1810      History   Chief Complaint Chief Complaint  Patient presents with  . Cough  . Headache  . Nasal Congestion    HPI Blake Arnold is a 52 y.o. male.   Patient presents here for evaluation of productive cough with yellow/green sputum, nasal congestion, and generalized fatigue for the last 5 days since Thursday, January 18, 2022.  Has been getting worse and is keeping him up at night.  Congestion makes it difficult for him to breathe through his nose.  He has heard wheezing to his chest over the last few days and reports mild shortness of breath at rest.  Denies chest pain, nausea, vomiting, heart palpitations, abdominal pain, body aches, decreased oral intake, fever/chills, and orthopnea.  No leg swelling.  History of avascular necrosis, RA, COPD, asthma, and HTN.  Current everyday cigarette smoker, denies other drug use.  No known sick contacts with similar symptoms.  Has been using over-the-counter medications in attempt to help with symptoms without relief prior to arrival urgent care.   Cough Associated symptoms: headaches   Headache Associated symptoms: cough     Past Medical History:  Diagnosis Date  . A-fib ([place])   . Acid reflux   . Alcohol abuse   . Anxiety   . Asthma   . AVN (avascular necrosis of bone) ([place])   . Chronic back pain   . COPD (chronic obstructive pulmonary disease) ([place])   . Depression   . Hepatitis C    history of  . History of urinary frequency   . Hypertension   . Peripheral neuropathy    hands and feet  . Polysubstance abuse ([place]) 01/25/2011  . 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[place])   . 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[place])   . Seizures [facility])     Patient Active Problem List   Diagnosis Date Noted  . AVN (avascular necrosis of bone) ([place]) 08/02/2021  . History of hepatitis C 04/12/2021  . Alcohol use disorder, severe, dependence ([place]) 04/12/2021  . Chronic pain 11/09/2020  .  Tooth abscess 01/14/2020  . Tongue lesion 10/11/2019  . Carpal tunnel syndrome 08/24/2019  . Onychomycosis 08/24/2019  . Cervicalgia 06/02/2019  . Left hip pain 04/06/2019  . Abdominal pain 02/28/2019  . Shoulder pain 12/17/2018  . Hyperlipidemia 09/01/2018  . HTN (hypertension) 07/24/2017  . Seizure ([place]) 07/24/2017  . RA (rheumatoid arthritis) ([place]) 07/24/2017  . Numbness in feet 03/04/2016  . Degenerative joint disease 12/28/2015  . OSA (obstructive sleep apnea) 11/23/2015  . 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 Alcohol abuse with alcohol-induced mood disorder ([place]) 07/26/2013  . Bipolar disorder, unspecified ([place]) 10/28/2012  . Alcohol dependence ([place]) 04/02/2012  . Alcohol withdrawal ([place]) 04/02/2012  . Major depression 04/02/2012  . Polysubstance abuse ([place]) 01/25/2011  . TOBACCO ABUSE 02/09/2010  . SUBSTANCE ABUSE 02/09/2010  . Tobacco dependence syndrome 02/09/2010  . FIBRILLATION, ATRIAL 06/08/2009    Past Surgical History:  Procedure Laterality Date  . APPENDECTOMY    . CARDIOVERSION  03/07/2006  . CARPAL TUNNEL RELEASE    . CERVICAL FUSION    . LAPAROSCOPIC APPENDECTOMY N/A 07/23/2017   Procedure: APPENDECTOMY LAPAROSCOPIC;  Surgeon: Georganna Skeans, MD;  Location: [place];  Service: General;  Laterality: N/A;  . TOTAL HIP ARTHROPLASTY Right 2016       Home Medications    Prior to Admission medications   Medication Sig Start Date  End Date Taking? Authorizing Provider  albuterol (VENTOLIN HFA) 108 (90 Base) MCG/ACT inhaler Inhale 1-2 puffs into the lungs every 6 (six) hours as needed for wheezing or shortness of breath. 02/12/22  Yes Talbot Grumbling, FNP  benzonatate (TESSALON) 100 MG capsule Take 1 capsule (100 mg total) by mouth every 8 (eight) hours. 02/12/22  Yes Talbot Grumbling, FNP  promethazine-dextromethorphan (PROMETHAZINE-DM) 6.25-15 MG/5ML syrup Take 5 mLs by mouth at bedtime as needed for cough. 02/12/22  Yes Talbot Grumbling, FNP  acetaminophen (TYLENOL) 500 MG tablet Take 500-1,000 mg by mouth every 8 (eight) hours as needed for mild pain or headache.    [provider]  Adalimumab (HUMIRA) 40 MG/0.8ML PSKT Inject 0.8 mLs (40 mg total) into the skin once a week. tuesdays Patient taking differently: Inject 40 mg into the skin every Sunday. 08/05/17 02/28/21  Saverio Danker, PA-C  amLODipine (NORVASC) 5 MG tablet TAKE 1 TABLET(5 MG) BY MOUTH AT BEDTIME Patient taking differently: Take 5 mg by mouth at bedtime. 12/15/19   [name], DO  diclofenac Sodium (VOLTAREN) 1 % GEL Apply 1 application topically 3 (three) times daily. Patient taking differently: Apply 2 g topically 3 (three) times daily as needed (for pain- affected sites). 12/22/19   Raulkar, Clide Deutscher, MD  gabapentin (NEURONTIN) 300 MG capsule TAKE 2 CAPSULES(600 MG) BY MOUTH THREE TIMES DAILY 07/16/21   [name], DO  hydrochlorothiazide (HYDRODIURIL) 25 MG tablet TAKE 1 TABLET(25 MG) BY MOUTH DAILY 12/13/21   [name], DO  ibuprofen (ADVIL) 800 MG tablet Take 1 tablet (800 mg total) by mouth 3 (three) times daily. 06/17/21   Avegno, Darrelyn Hillock, FNP  loratadine (CLARITIN) 10 MG tablet Take 10 mg by mouth daily as needed for rhinitis or allergies.    [provider]  metoprolol tartrate (LOPRESSOR) 50 MG tablet TAKE 1 TABLET(50 MG) BY MOUTH TWICE DAILY Patient taking differently: Take 50 mg by mouth in the morning and at bedtime. 01/15/21   [name], DO  miconazole (MICOTIN) 2 % cream Apply 1 Application topically 2 (two) times daily. Use until 2-3 days after the rash has resolved 09/24/21   Lilland, Alana, DO  naltrexone (DEPADE) 50 MG tablet Take 1 tablet (50 mg total) by mouth daily. 09/04/21   [name], DO  nicotine (NICODERM CQ - DOSED IN MG/24 HOURS) 21 mg/24hr patch Place 1 patch (21 mg total) onto the skin daily. 09/04/21   [name], DO  ondansetron (ZOFRAN) 4 MG tablet Take  1 tablet (4 mg total) by mouth every 8 (eight) hours as needed for nausea or vomiting. 01/21/22   Alcus Dad, MD  simvastatin (ZOCOR) 40 MG tablet TAKE 1 TABLET(40 MG) BY MOUTH DAILY Patient taking differently: Take 40 mg by mouth at bedtime. 01/15/21   [name], DO    Family History Family History  Problem Relation Age of Onset  . Heart attack Mother   . Atrial fibrillation Mother   . Skin cancer Father   . Colon polyps Neg Hx   . Esophageal cancer Neg Hx   . Stomach cancer Neg Hx   . Rectal cancer Neg Hx     Social History Social History   Tobacco Use  . Smoking status: Every Day    Packs/day: 1.00    Years: 34.00    Total pack years: 34.00    Types: Cigarettes    Start date: 02/12/1984    Passive exposure: Current  . Smokeless tobacco: Former  Quit date: 10/24/2012  . Tobacco comments:    1.5-2ppd  Quit attempt 12/24/2018  Vaping Use  . Vaping Use: Never used  Substance Use Topics  . Alcohol use: Yes    Comment: socially  . Drug use: Not Currently    Types: "Crack" cocaine, Cocaine    Comment: last use 07/26/13     Allergies   Lisinopril   Review of Systems Review of Systems  Respiratory:  Positive for cough.   Neurological:  Positive for headaches.  Per HPI   Physical Exam Triage Vital Signs ED Triage Vitals [02/12/22 1904]  Enc Vitals Group     BP 137/87     Pulse Rate 84     Resp 20     Temp 99.1 F (37.3 C)     Temp src      SpO2 98 %     Weight      Height      Head Circumference      Peak Flow      Pain Score      Pain Loc      Pain Edu?      Excl. in [place]?    No data found.  Updated Vital Signs BP 137/87   Pulse 84   Temp 99.1 F (37.3 C)   Resp 20   SpO2 98%   Visual Acuity Right Eye Distance:   Left Eye Distance:   Bilateral Distance:    Right Eye Near:   Left Eye Near:    Bilateral Near:     Physical Exam Vitals and nursing note reviewed.  Constitutional:      Appearance: He is not ill-appearing or  toxic-appearing.  HENT:     Head: Normocephalic and atraumatic.     Right Ear: Hearing, tympanic membrane, ear canal and external ear normal.     Left Ear: Hearing, tympanic membrane, ear canal and external ear normal.     Nose: Nose normal.     Mouth/Throat:     Lips: Pink.  Eyes:     General: Lids are normal. Vision grossly intact. Gaze aligned appropriately.     Extraocular Movements: Extraocular movements intact.     Conjunctiva/sclera: Conjunctivae normal.  Cardiovascular:     Rate and Rhythm: Normal rate and regular rhythm.     Heart sounds: Normal heart sounds, S1 normal and S2 normal.  Pulmonary:     Effort: Pulmonary effort is normal. No accessory muscle usage, prolonged expiration, respiratory distress or retractions.     Breath sounds: Normal air entry. Wheezing present. No rhonchi or rales.     Comments: Faint and diffuse expiratory wheezing heard to all lung fields.  No acute respiratory distress. Musculoskeletal:     Cervical back: Neck supple.  Skin:    General: Skin is warm and dry.     Capillary Refill: Capillary refill takes less than 2 seconds.     Findings: No rash.  Neurological:     General: No focal deficit present.     Mental Status: He is alert and oriented to person, place, and time. Mental status is at baseline.     Cranial Nerves: No dysarthria or facial asymmetry.  Psychiatric:        Mood and Affect: Mood normal.        Speech: Speech normal.        Behavior: Behavior normal.        Thought Content: Thought content normal.  Judgment: Judgment normal.     UC Treatments / Results  Labs (all labs ordered are listed, but only abnormal results are displayed) Labs Reviewed - No data to display  EKG   Radiology No results found.  Procedures Procedures (including critical care time)  Medications Ordered in UC Medications  ipratropium-albuterol (DUONEB) 0.5-2.5 (3) MG/3ML nebulizer solution 3 mL (3 mLs Nebulization Given 02/12/22 2009)   methylPREDNISolone sodium succinate (SOLU-MEDROL) 125 mg/2 mL injection 80 mg (80 mg Intramuscular Given 02/12/22 2009)    Initial Impression / Assessment and Plan / UC Course  I have reviewed the triage vital signs and the nursing notes.  Pertinent labs & imaging results that were available during my care of the patient were reviewed by me and considered in my medical decision making (see chart for details).   1.  Acute bronchitis, acute cough, wheeze Presentation is consistent with acute bronchitis etiology but we will manage this as a COPD exacerbation as I believe this is contributing.  Patient has history of avascular necrosis, therefore I gave him a Solu-Medrol 80 mg injection IM in the clinic but will defer prednisone burst orally.  He may use albuterol inhaler at home every 4-6 hours as needed for cough, shortness of breath, and wheeze.  DuoNeb provided in clinic to help with shortness of breath and wheezing.  Patient reports subjective improvement in shortness of breath after DuoNeb, lung sounds improved as well.  Deferred imaging based on stable cardiopulmonary exam and hemodynamically stable vital signs.  He is clinically well-appearing and does not appear to be dehydrated.  Tolerating food and fluids well.  May continue taking guaifenesin as this will help to break up mucus.  Tessalon Perles every 8 hours as needed for cough sent to pharmacy.  Promethazine DM at bedtime sent to pharmacy as well, drowsiness precautions discussed.  He is agreeable with this plan.  Discussed physical exam and available lab work findings in clinic with patient.  Counseled patient regarding appropriate use of medications and potential side effects for all medications recommended or prescribed today. Discussed red flag signs and symptoms of worsening condition,when to call the PCP office, return to urgent care, and when to seek higher level of care in the emergency department. Patient verbalizes understanding and  agreement with plan. All questions answered. Patient discharged in stable condition.  {The patient has been seen in Urgent Care in the last 3 years. :1} Final Clinical Impressions(s) / UC Diagnoses   Final diagnoses:  Acute bronchitis, unspecified organism  Acute cough  Wheeze     Discharge Instructions      You have bronchitis which is inflammation of the upper airways in your lungs.  Continue using guaifenesin (Mucinex).   You may use albuterol inhaler 1 to 2 puffs every 4-6 hours as needed for cough, shortness of breath, and wheezing.  Tessalon perles every 8 hours as needed for cough.  You may use Promethazine DM cough syrup at bedtime as needed.  Do not use this during the day, if you go to work, or drink alcohol when using this as it can make you very sleepy.   If you develop any new or worsening symptoms or do not improve in the next 2 to 3 days, please return.  If your symptoms are severe, please go to the emergency room.  Follow-up with your primary care provider for further evaluation and management of your symptoms as well as ongoing wellness visits.  I hope you feel better!  ED Prescriptions     Medication Sig Dispense Auth. Provider   benzonatate (TESSALON) 100 MG capsule Take 1 capsule (100 mg total) by mouth every 8 (eight) hours. 21 capsule Talbot Grumbling, FNP   promethazine-dextromethorphan (PROMETHAZINE-DM) 6.25-15 MG/5ML syrup Take 5 mLs by mouth at bedtime as needed for cough. 118 mL Joella Prince M, FNP   albuterol (VENTOLIN HFA) 108 (90 Base) MCG/ACT inhaler Inhale 1-2 puffs into the lungs every 6 (six) hours as needed for wheezing or shortness of breath. 18 g Talbot Grumbling, FNP      PDMP not reviewed this encounter.

## 2022-02-12 NOTE — ED Triage Notes (Signed)
Pt reports he has a runny nose ,congestion, productive cough and HA since last thursday.

## 2022-02-12 NOTE — Discharge Instructions (Addendum)
You have bronchitis which is inflammation of the upper airways in your lungs.  Continue using guaifenesin (Mucinex).   You may use albuterol inhaler 1 to 2 puffs every 4-6 hours as needed for cough, shortness of breath, and wheezing.  Tessalon perles every 8 hours as needed for cough.  You may use Promethazine DM cough syrup at bedtime as needed.  Do not use this during the day, if you go to work, or drink alcohol when using this as it can make you very sleepy.   If you develop any new or worsening symptoms or do not improve in the next 2 to 3 days, please return.  If your symptoms are severe, please go to the emergency room.  Follow-up with your primary care provider for further evaluation and management of your symptoms as well as ongoing wellness visits.  I hope you feel better!

## 2022-03-04 ENCOUNTER — Encounter: Payer: Self-pay | Admitting: Physical Medicine and Rehabilitation

## 2022-03-04 ENCOUNTER — Encounter
Payer: Medicaid Other | Attending: Physical Medicine and Rehabilitation | Admitting: Physical Medicine and Rehabilitation

## 2022-03-04 ENCOUNTER — Other Ambulatory Visit: Payer: Self-pay | Admitting: Physical Medicine and Rehabilitation

## 2022-03-04 VITALS — BP 154/89 | HR 74 | Ht 68.0 in | Wt 242.0 lb

## 2022-03-04 DIAGNOSIS — G622 Polyneuropathy due to other toxic agents: Secondary | ICD-10-CM | POA: Insufficient documentation

## 2022-03-04 DIAGNOSIS — M25511 Pain in right shoulder: Secondary | ICD-10-CM | POA: Diagnosis not present

## 2022-03-04 DIAGNOSIS — G8929 Other chronic pain: Secondary | ICD-10-CM

## 2022-03-04 DIAGNOSIS — I10 Essential (primary) hypertension: Secondary | ICD-10-CM | POA: Diagnosis not present

## 2022-03-04 MED ORDER — CAPSAICIN-CLEANSING GEL 8 % EX KIT
4.0000 | PACK | Freq: Once | CUTANEOUS | Status: AC
  Administered 2022-03-04: 1 via TOPICAL
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
## 2022-03-10 ENCOUNTER — Ambulatory Visit (HOSPITAL_COMMUNITY): Payer: Medicaid Other

## 2022-03-19 ENCOUNTER — Other Ambulatory Visit: Payer: Self-pay

## 2022-03-19 DIAGNOSIS — F1020 Alcohol dependence, uncomplicated: Secondary | ICD-10-CM

## 2022-03-19 MED ORDER — NALTREXONE HCL 50 MG PO TABS: 50.0000 mg | ORAL_TABLET | Freq: Every day | ORAL | 2 refills | Status: DC

## 2022-03-23 ENCOUNTER — Ambulatory Visit (HOSPITAL_COMMUNITY)
Admission: RE | Admit: 2022-03-23 | Discharge: 2022-03-23 | Disposition: A | Discharge status: Home / Self Care | Payer: Medicaid Other | Source: Ambulatory Visit | Attending: Physical Medicine and Rehabilitation | Admitting: Physical Medicine and Rehabilitation

## 2022-03-23 DIAGNOSIS — G8929 Other chronic pain: Secondary | ICD-10-CM | POA: Diagnosis present

## 2022-03-23 DIAGNOSIS — M25511 Pain in right shoulder: Secondary | ICD-10-CM | POA: Insufficient documentation

## 2022-03-26 ENCOUNTER — Encounter
Payer: Medicaid Other | Attending: Physical Medicine and Rehabilitation | Admitting: Physical Medicine and Rehabilitation

## 2022-03-26 DIAGNOSIS — S43431A Superior glenoid labrum lesion of right shoulder, initial encounter: Secondary | ICD-10-CM | POA: Insufficient documentation

## 2022-03-26 NOTE — Addendum Note (Signed)
Addended by: [name] on: 03/26/2022 05:29 PM   Modules accepted: Orders
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
## 2022-03-28 ENCOUNTER — Telehealth: Payer: Medicaid Other | Admitting: Physical Medicine and Rehabilitation

## 2022-04-01 ENCOUNTER — Other Ambulatory Visit: Payer: Self-pay | Admitting: Family Medicine

## 2022-04-08 ENCOUNTER — Ambulatory Visit: Payer: Medicaid Other | Admitting: Sports Medicine

## 2022-04-08 ENCOUNTER — Encounter: Payer: Self-pay | Admitting: Physician Assistant

## 2022-04-08 ENCOUNTER — Ambulatory Visit (INDEPENDENT_AMBULATORY_CARE_PROVIDER_SITE_OTHER): Payer: Medicaid Other | Admitting: Physician Assistant

## 2022-04-08 DIAGNOSIS — M19011 Primary osteoarthritis, right shoulder: Secondary | ICD-10-CM | POA: Diagnosis not present
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
## 2022-04-10 ENCOUNTER — Ambulatory Visit: Payer: Medicaid Other | Admitting: Sports Medicine

## 2022-04-15 ENCOUNTER — Ambulatory Visit: Payer: Self-pay

## 2022-04-15 ENCOUNTER — Encounter: Payer: Self-pay | Admitting: Sports Medicine

## 2022-04-15 ENCOUNTER — Ambulatory Visit (INDEPENDENT_AMBULATORY_CARE_PROVIDER_SITE_OTHER): Payer: Medicaid Other | Admitting: Sports Medicine

## 2022-04-15 DIAGNOSIS — M25511 Pain in right shoulder: Secondary | ICD-10-CM

## 2022-04-15 DIAGNOSIS — G8929 Other chronic pain: Secondary | ICD-10-CM | POA: Diagnosis not present

## 2022-04-15 DIAGNOSIS — M19011 Primary osteoarthritis, right shoulder: Secondary | ICD-10-CM | POA: Diagnosis not present

## 2022-04-15 MED ORDER — METHYLPREDNISOLONE ACETATE 40 MG/ML IJ SUSP
80.0000 mg | INTRAMUSCULAR | Status: AC | PRN
  Administered 2022-04-15: 80 mg via INTRA_ARTICULAR

## 2022-04-15 MED ORDER — BUPIVACAINE HCL 0.25 % IJ SOLN
2.0000 mL | INTRAMUSCULAR | Status: AC | PRN
  Administered 2022-04-15: 2 mL via INTRA_ARTICULAR

## 2022-04-15 MED ORDER — LIDOCAINE HCL 1 % IJ SOLN
2.0000 mL | INTRAMUSCULAR | Status: AC | PRN
  Administered 2022-04-15: 2 mL

## 2022-04-15 NOTE — Progress Notes (Signed)
   Procedure Note  Patient: Blake Arnold             Date of Birth: [birth year]           MRN: [id number]             Visit Date: 04/15/2022  Procedures: Visit Diagnoses:  1. Chronic right shoulder pain   2. Primary osteoarthritis of right shoulder    Large Joint Inj: R glenohumeral on 04/15/2022 3:50 PM Indications: pain Details: 22 G 3.5 in needle, ultrasound-guided posterior approach Medications: 2 mL lidocaine 1 %; 2 mL bupivacaine 0.25 %; 80 mg methylPREDNISolone acetate 40 MG/ML Outcome: tolerated well, no immediate complications  US-guided glenohumeral joint injection, right shoulder After discussion on risks/benefits/indications, informed verbal consent was obtained. A timeout was then performed. The patient was positioned lying lateral recumbent on examination table. The patient's shoulder was prepped with betadine and multiple alcohol swabs and utilizing ultrasound guidance, the patient's glenohumeral joint was identified on ultrasound. Using ultrasound guidance a 22-gauge, 3.5 inch needle with a mixture of 2:2:2 cc's lidocaine:bupivicaine:depomedrol was directed from a lateral to medial direction via in-plane technique into the glenohumeral joint with visualization of appropriate spread of injectate into the joint. Patient tolerated the procedure well without immediate complications.      Procedure, treatment alternatives, risks and benefits explained, specific risks discussed. Consent was given by the patient. Immediately prior to procedure a time out was called to verify the correct patient, procedure, equipment, support staff and site/side marked as required. Patient was prepped and draped in the usual sterile fashion.    - I evaluated the patient about 10 minutes post-injection and he had excellent improvement in pain and range of motion - note provided for work today - follow-up with Blake Arnold as indicated in about 3-4 weeks; I am happy to see them as needed  Blake Barman, DO [place]  This note was dictated using Dragon naturally speaking software and may contain errors in syntax, spelling, or content which have not been identified prior to signing this note.

## 2022-04-17 ENCOUNTER — Other Ambulatory Visit: Payer: Self-pay | Admitting: Family Medicine

## 2022-04-17 DIAGNOSIS — F1020 Alcohol dependence, uncomplicated: Secondary | ICD-10-CM

## 2022-04-18 ENCOUNTER — Encounter: Payer: Self-pay | Admitting: Radiology

## 2022-04-23 ENCOUNTER — Ambulatory Visit (INDEPENDENT_AMBULATORY_CARE_PROVIDER_SITE_OTHER): Payer: Medicaid Other | Admitting: Family Medicine

## 2022-04-23 ENCOUNTER — Encounter: Payer: Self-pay | Admitting: Family Medicine

## 2022-04-23 VITALS — BP 145/87 | HR 97 | Temp 98.9°F | Wt 238.0 lb

## 2022-04-23 DIAGNOSIS — K529 Noninfective gastroenteritis and colitis, unspecified: Secondary | ICD-10-CM

## 2022-04-23 DIAGNOSIS — I10 Essential (primary) hypertension: Secondary | ICD-10-CM

## 2022-04-23 LAB — POC SOFIA 2 FLU + SARS ANTIGEN FIA
Influenza A, POC: NEGATIVE
Influenza B, POC: NEGATIVE
SARS Coronavirus 2 Ag: NEGATIVE

## 2022-04-23 MED ORDER — ONDANSETRON HCL 4 MG PO TABS: 4.0000 mg | ORAL_TABLET | Freq: Three times a day (TID) | ORAL | 0 refills | Status: DC | PRN

## 2022-04-23 NOTE — Assessment & Plan Note (Signed)
Patient's acute history of nausea and diarrhea is consistent with gastroenteritis.  Patient has many contacts through which he could have become sick.  No signs of acute abdomen.  Did not recently take antibiotics unlikely C. difficile.  He does not seem acutely dehydrated needing IV fluids at this time. - Flu/COVID test - Encouraged hydration through Gatorade 0, Pedialyte, avoid excessively sugary drinks as to not exacerbate diarrhea - Zofran 4 mg p.o. every 8 hours as needed - Return precautions given if unable to stay hydrated

## 2022-04-23 NOTE — Patient Instructions (Signed)
It was wonderful to see you today.  Please bring ALL of your medications with you to every visit.   Today we talked about:  Nausea - I have prescribed zofran. Take this for nausea up to every 8 hours. Take tylenol for discomfort and fever. It is important to stay hydrated. Try to avoid sugary juices as this can make diarrhea worse.   Please follow up as needed.   Thank you for choosing [facility].   Please call [phone] with any questions about today's appointment.  Please be sure to schedule follow up at the front desk before you leave today.   Lowry Ram, MD  Family Medicine

## 2022-04-23 NOTE — Addendum Note (Signed)
Addended by: Lowry Ram on: 04/23/2022 04:51 PM   Modules accepted: Level of Service

## 2022-04-23 NOTE — Progress Notes (Cosign Needed Addendum)
    SUBJECTIVE:   CHIEF COMPLAINT / HPI:   Patient started feeling unwell on Sunday night with night sweats. His pillow and clothes were soaked. He complains of nausea, diarrhea, loss of appetite, no vomiting yet. Has not been taking tylenol or ibuprofen. Anything he eats or drinks leads to diarrhea. He has been around a lot of people. 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   Patient says that before this illness he did not have constipation. He was having 2 bowel movements a day. He did not start any medications recently.   Does endorse congestion. He says it has been going on  for about a week. He does smoke. He smokes a ppd. He takes flonase and an allergy pill everyday.    PERTINENT  PMH / PSH: RA, AUD, Tobacco use, OSA, Afib, HTN   OBJECTIVE:   BP (!) 145/87   Pulse 97   Temp 98.9 F (37.2 C)   Wt 238 lb (108 kg)   SpO2 97%   BMI 36.19 kg/m   General: uncomfortable  appearing, in no acute distress CV: RRR, radial pulses equal and palpable, no BLE edema, cap refill = 2 seconds Resp: Normal work of breathing on room air, CTAB Abd: Soft, non tender, non distended  Neuro: Alert & Oriented x 4    ASSESSMENT/PLAN:   Gastroenteritis Patient's acute history of nausea and diarrhea is consistent with gastroenteritis.  Patient has many contacts through which he could have become sick.  No signs of acute abdomen.  Did not recently take antibiotics unlikely C. difficile.  He does not seem acutely dehydrated needing IV fluids at this time. - Flu/COVID test - Encouraged hydration through Gatorade 0, Pedialyte, avoid excessively sugary drinks as to not exacerbate diarrhea - Zofran 4 mg p.o. every 8 hours as needed - Return precautions given if unable to stay hydrated  HTN (hypertension) Patient was hypertensive to 145 over 80s at this visit.  He has been taking his hypertensive medications however he has been having profuse diarrhea.   - Will not make  any changes to his medications at this time given that he is acutely ill and may be more hypertensive due to acute illness. - Recommended follow-up with PCP in 1 month     Lowry Ram, MD [place]

## 2022-04-23 NOTE — Assessment & Plan Note (Signed)
Patient was hypertensive to 145 over 80s at this visit.  He has been taking his hypertensive medications however he has been having profuse diarrhea.   - Will not make any changes to his medications at this time given that he is acutely ill and may be more hypertensive due to acute illness. - Recommended follow-up with PCP in 1 month

## 2022-05-02 ENCOUNTER — Other Ambulatory Visit (HOSPITAL_COMMUNITY)
Admission: EM | Admit: 2022-05-02 | Discharge: 2022-05-05 | Disposition: A | Discharge status: Home / Self Care | Payer: Medicaid Other | Attending: Psychiatry | Admitting: Psychiatry

## 2022-05-02 DIAGNOSIS — F1490 Cocaine use, unspecified, uncomplicated: Secondary | ICD-10-CM | POA: Insufficient documentation

## 2022-05-02 DIAGNOSIS — E785 Hyperlipidemia, unspecified: Secondary | ICD-10-CM | POA: Insufficient documentation

## 2022-05-02 DIAGNOSIS — I4891 Unspecified atrial fibrillation: Secondary | ICD-10-CM | POA: Insufficient documentation

## 2022-05-02 DIAGNOSIS — F32A Depression, unspecified: Secondary | ICD-10-CM | POA: Insufficient documentation

## 2022-05-02 DIAGNOSIS — Z87891 Personal history of nicotine dependence: Secondary | ICD-10-CM | POA: Insufficient documentation

## 2022-05-02 DIAGNOSIS — I10 Essential (primary) hypertension: Secondary | ICD-10-CM | POA: Insufficient documentation

## 2022-05-02 DIAGNOSIS — Z79899 Other long term (current) drug therapy: Secondary | ICD-10-CM | POA: Insufficient documentation

## 2022-05-02 DIAGNOSIS — Z1152 Encounter for screening for COVID-19: Secondary | ICD-10-CM | POA: Insufficient documentation

## 2022-05-02 DIAGNOSIS — G4733 Obstructive sleep apnea (adult) (pediatric): Secondary | ICD-10-CM | POA: Insufficient documentation

## 2022-05-02 DIAGNOSIS — R569 Unspecified convulsions: Secondary | ICD-10-CM | POA: Insufficient documentation

## 2022-05-02 DIAGNOSIS — F1020 Alcohol dependence, uncomplicated: Secondary | ICD-10-CM | POA: Diagnosis present

## 2022-05-02 DIAGNOSIS — M069 Rheumatoid arthritis, unspecified: Secondary | ICD-10-CM | POA: Insufficient documentation

## 2022-05-02 DIAGNOSIS — F419 Anxiety disorder, unspecified: Secondary | ICD-10-CM | POA: Insufficient documentation

## 2022-05-02 DIAGNOSIS — Z791 Long term (current) use of non-steroidal anti-inflammatories (NSAID): Secondary | ICD-10-CM | POA: Insufficient documentation

## 2022-05-02 LAB — CBC WITH DIFFERENTIAL/PLATELET
Abs Immature Granulocytes: 0.04 10*3/uL (ref 0.00–0.07)
Basophils Absolute: 0.1 10*3/uL (ref 0.0–0.1)
Basophils Relative: 1 %
Eosinophils Absolute: 0.2 10*3/uL (ref 0.0–0.5)
Eosinophils Relative: 2 %
HCT: 52.1 % — ABNORMAL HIGH (ref 39.0–52.0)
Hemoglobin: 18.1 g/dL — ABNORMAL HIGH (ref 13.0–17.0)
Immature Granulocytes: 1 %
Lymphocytes Relative: 35 %
Lymphs Abs: 2.7 10*3/uL (ref 0.7–4.0)
MCH: 32.6 pg (ref 26.0–34.0)
MCHC: 34.7 g/dL (ref 30.0–36.0)
MCV: 93.7 fL (ref 80.0–100.0)
Monocytes Absolute: 0.7 10*3/uL (ref 0.1–1.0)
Monocytes Relative: 9 %
Neutro Abs: 3.9 10*3/uL (ref 1.7–7.7)
Neutrophils Relative %: 52 %
Platelets: 260 10*3/uL (ref 150–400)
RBC: 5.56 MIL/uL (ref 4.22–5.81)
RDW: 13.1 % (ref 11.5–15.5)
WBC: 7.6 10*3/uL (ref 4.0–10.5)
nRBC: 0 % (ref 0.0–0.2)

## 2022-05-02 LAB — RESP PANEL BY RT-PCR (RSV, FLU A&B, COVID)  RVPGX2
Influenza A by PCR: NEGATIVE
Influenza B by PCR: NEGATIVE
Resp Syncytial Virus by PCR: NEGATIVE
SARS Coronavirus 2 by RT PCR: NEGATIVE

## 2022-05-02 LAB — POC SARS CORONAVIRUS 2 AG: SARSCOV2ONAVIRUS 2 AG: NEGATIVE

## 2022-05-02 LAB — HEMOGLOBIN A1C
Hgb A1c MFr Bld: 5.3 % (ref 4.8–5.6)
Mean Plasma Glucose: 105 mg/dL

## 2022-05-02 LAB — COMPREHENSIVE METABOLIC PANEL
ALT: 62 U/L — ABNORMAL HIGH (ref 0–44)
AST: 26 U/L (ref 15–41)
Albumin: 3.9 g/dL (ref 3.5–5.0)
Alkaline Phosphatase: 57 U/L (ref 38–126)
Anion gap: 11 (ref 5–15)
BUN: 9 mg/dL (ref 6–20)
CO2: 25 mmol/L (ref 22–32)
Calcium: 8.8 mg/dL — ABNORMAL LOW (ref 8.9–10.3)
Chloride: 99 mmol/L (ref 98–111)
Creatinine, Ser: 0.75 mg/dL (ref 0.61–1.24)
GFR, Estimated: >60 mL/min (ref >60)
Glucose, Bld: 105 mg/dL — ABNORMAL HIGH (ref 70–99)
Potassium: 3.6 mmol/L (ref 3.5–5.1)
Sodium: 135 mmol/L (ref 135–145)
Total Bilirubin: 1.1 mg/dL (ref 0.3–1.2)
Total Protein: 7.8 g/dL (ref 6.5–8.1)

## 2022-05-02 LAB — POCT URINE DRUG SCREEN - MANUAL ENTRY (I-SCREEN)
POC Amphetamine UR: NOT DETECTED
POC Buprenorphine (BUP): NOT DETECTED
POC Cocaine UR: POSITIVE — AB
POC Marijuana UR: NOT DETECTED
POC Methadone UR: NOT DETECTED
POC Methamphetamine UR: POSITIVE — AB
POC Morphine: NOT DETECTED
POC Oxazepam (BZO): NOT DETECTED
POC Oxycodone UR: NOT DETECTED
POC Secobarbital (BAR): NOT DETECTED

## 2022-05-02 LAB — LIPID PANEL
Cholesterol: 196 mg/dL (ref 0–200)
HDL: 50 mg/dL (ref >40)
LDL Cholesterol: UNDETERMINED mg/dL (ref 0–99)
Total CHOL/HDL Ratio: 3.9 RATIO
Triglycerides: 517 mg/dL — ABNORMAL HIGH (ref <150)
VLDL: UNDETERMINED mg/dL (ref 0–40)

## 2022-05-02 LAB — LDL CHOLESTEROL, DIRECT: Direct LDL: 76 mg/dL (ref 0–99)

## 2022-05-02 LAB — TSH: TSH: 1.885 u[IU]/mL (ref 0.350–4.500)

## 2022-05-02 LAB — ETHANOL: Alcohol, Ethyl (B): 233 mg/dL — ABNORMAL HIGH (ref <10)

## 2022-05-02 MED ORDER — LORAZEPAM 1 MG PO TABS: 1.0000 mg | ORAL_TABLET | Freq: Four times a day (QID) | ORAL | Status: DC | PRN

## 2022-05-02 MED ORDER — THIAMINE MONONITRATE 100 MG PO TABS
100.0000 mg | ORAL_TABLET | Freq: Every day | ORAL | Status: DC
  Administered 2022-05-03 – 2022-05-05 (×3): 100 mg via ORAL
  Filled 2022-05-02 (×3): qty 1

## 2022-05-02 MED ORDER — METOPROLOL TARTRATE 25 MG PO TABS
25.0000 mg | ORAL_TABLET | Freq: Two times a day (BID) | ORAL | Status: DC
  Administered 2022-05-02 – 2022-05-05 (×6): 25 mg via ORAL
  Filled 2022-05-02 (×6): qty 1

## 2022-05-02 MED ORDER — HYDROXYZINE HCL 25 MG PO TABS
25.0000 mg | ORAL_TABLET | Freq: Three times a day (TID) | ORAL | Status: DC | PRN
  Administered 2022-05-03: 25 mg via ORAL
  Filled 2022-05-02: qty 1

## 2022-05-02 MED ORDER — NALTREXONE HCL 50 MG PO TABS
50.0000 mg | ORAL_TABLET | Freq: Every day | ORAL | Status: DC
  Administered 2022-05-03 – 2022-05-05 (×3): 50 mg via ORAL
  Filled 2022-05-02 (×3): qty 1

## 2022-05-02 MED ORDER — ONDANSETRON 4 MG PO TBDP: 4.0000 mg | ORAL_TABLET | Freq: Four times a day (QID) | ORAL | Status: DC | PRN

## 2022-05-02 MED ORDER — ADULT MULTIVITAMIN W/MINERALS CH
1.0000 | ORAL_TABLET | Freq: Every day | ORAL | Status: DC
  Administered 2022-05-02 – 2022-05-03 (×2): 1 via ORAL
  Filled 2022-05-02 (×2): qty 1

## 2022-05-02 MED ORDER — LOPERAMIDE HCL 2 MG PO CAPS: 2.0000 mg | ORAL_CAPSULE | ORAL | Status: DC | PRN

## 2022-05-02 MED ORDER — AMLODIPINE BESYLATE 10 MG PO TABS
10.0000 mg | ORAL_TABLET | Freq: Every day | ORAL | Status: DC
  Administered 2022-05-02 – 2022-05-05 (×4): 10 mg via ORAL
  Filled 2022-05-02 (×4): qty 1

## 2022-05-02 MED ORDER — THIAMINE HCL 100 MG/ML IJ SOLN
100.0000 mg | Freq: Once | INTRAMUSCULAR | Status: AC
  Administered 2022-05-02: 100 mg via INTRAMUSCULAR
  Filled 2022-05-02: qty 2

## 2022-05-02 MED ORDER — HYDROCHLOROTHIAZIDE 25 MG PO TABS
25.0000 mg | ORAL_TABLET | Freq: Every day | ORAL | Status: DC
  Administered 2022-05-02 – 2022-05-05 (×4): 25 mg via ORAL
  Filled 2022-05-02 (×4): qty 1

## 2022-05-02 MED ORDER — GABAPENTIN 300 MG PO CAPS
300.0000 mg | ORAL_CAPSULE | Freq: Three times a day (TID) | ORAL | Status: DC
  Administered 2022-05-02 – 2022-05-05 (×10): 300 mg via ORAL
  Filled 2022-05-02 (×10): qty 1

## 2022-05-02 MED ORDER — TRAZODONE HCL 50 MG PO TABS
50.0000 mg | ORAL_TABLET | Freq: Every evening | ORAL | Status: DC | PRN
  Administered 2022-05-02: 50 mg via ORAL
  Filled 2022-05-02 (×2): qty 1

## 2022-05-02 MED ORDER — MAGNESIUM HYDROXIDE 400 MG/5ML PO SUSP: 30.0000 mL | Freq: Every day | ORAL | Status: DC | PRN

## 2022-05-02 MED ORDER — ACETAMINOPHEN 325 MG PO TABS: 650.0000 mg | ORAL_TABLET | Freq: Four times a day (QID) | ORAL | Status: DC | PRN

## 2022-05-02 NOTE — Progress Notes (Signed)
   05/02/22 1000  [place] (Walk-ins at [facility] only)  How Did You Hear About [place]? Self  What Is the Reason for Your Visit/Call Today? Seeking substance use treamtent for alcohol and cocaine use. Pt denies SI, HI, AVH.  How Long Has This Been Causing You Problems? > than 6 months  Have You Recently Had Any Thoughts About Hurting Yourself? No  Are You Planning to Commit Suicide/Harm Yourself At This time? No  Have you Recently Had Thoughts About [place]? No  Are You Planning To Harm Someone At This Time? No  Are you currently experiencing any auditory, visual or other hallucinations? No  Have You Used Any Alcohol or Drugs in the Past 24 Hours? Yes  How long ago did you use Drugs or Alcohol? this morning  What Did You Use and How Much? 2-25 ounce beers  Do you have any current medical co-morbidities that require immediate attention? No  Clinician description of patient physical appearance/behavior: NA  What Do You Feel Would Help You the Most Today? Alcohol or Drug Use Treatment  If access to [facility] was not available, would you have sought care in the Emergency Department? No  Determination of Need Routine (7 days)  Options For Referral Medication Management;Outpatient Therapy;Facility-Based Crisis

## 2022-05-02 NOTE — ED Notes (Signed)
Patient calm and pleasant.  Quiet on approach.  Patient attended a recovery group outside run by nursing.  He was attentive.

## 2022-05-02 NOTE — ED Notes (Signed)
Pt admitted to [facility] requesting ETOH detox. Pt denies SI/HI/AVH. Pt states, "I drink a lot. I last drank this morning to prevent me from having tremors. I drank 2-25oz beer. I feel pretty good right now (laughing)". Calm, cooperative throughout interview process. Skin assessment completed. Oriented to unit. Meal and drink offered. Pt verbally contract for safety. Will monitor for safety.

## 2022-05-02 NOTE — ED Notes (Signed)
Pt is in the [employer] [facility]. Respirations are even and unlabored. No acute distress noted. Will continue to monitor for safety.

## 2022-05-02 NOTE — ED Notes (Signed)
Pt is in the bed sleeping. Respirations are even and unlabored. No acute distress noted. Will continue to monitor for safety. 

## 2022-05-02 NOTE — ED Provider Notes (Signed)
Facility Based Crisis Admission H&P  Date: 05/02/22 Patient Name: Blake Arnold MRN: [id number] Chief Complaint: "I need help with detox to stop drinking"  Diagnoses:  Final diagnoses:  Alcohol use disorder, severe, dependence ([place])  Cocaine use    HPI:  Blake Arnold 52 y.o., male patient presented to [facility] as a walk in voluntarily unaccompanied requesting detox from alcohol.  Blake Arnold, 52 y.o., male patient seen face to face by this provider, consulted with Dr. Dwyane Dee ; and chart reviewed on 05/02/22.    On evaluation Blake Arnold reports that he is wanting to check himself in the [facility] for detox. Last drink was earlier this morning. Patient reports drinking an average of 5th of liquor per day along with a few beers every day. Patient reports achieving approximately 4 months of sobriety after discharging from [facility] last year and following up with an outpatient provider who started him on Natrexone. He endorses stressors with family and at work as exacerbating his drinking. He reports within the last 2 weeks his stress level has soared and he is drinking as soon as he awakened in the morning. He reports intermittent cocaine use and he last used last night. He reports feeling as if the cocaine he used last night was laced with methamphetamines as it made him feel different in a negative way. He plans to follow-up with outpatient alcohol addiction treatment program once completing detox at [facility]. Denies any recent history of DT or seizures.   Blake Arnold has a medical history significant for hypertension, atrial fibrillation, and rheumatoid arthritis. He only takes medication for hypertension and to control heart rate for atrial fibrillation. Patient denies being prescribed any anticoagulant therapy. He denies any chest pain, palpitations, or shortness of breath. Reports being compliant with routine preventative health maintenance follow-up.   During evaluation Blake Arnold is  sitting in chair in no acute distress. He is alert, oriented x 4, calm, cooperative and attentive.  His mood is euthymic with congruent affect.  He  has normal speech, and behavior.  Objectively there is no evidence of psychosis/mania or delusional thinking.  Patient is able to converse coherently, goal directed thoughts, no distractibility, or pre-occupation.  He also denies suicidal/self-harm/homicidal ideation, psychosis, and paranoia.  Patient answered question appropriately.     PHQ 2-9:  [facility] ED from 05/02/2022 in [facility] Office Visit from 01/21/2022 in [place] Office Visit from 09/24/2021 in [place]  Thoughts that you would be better off dead, or of hurting yourself in some way Not at all Not at all Not at all  PHQ-9 Total Score 6 1 5        Flowsheet Row ED from 05/02/2022 in [facility] ED from 02/12/2022 in [facility] at [facility] ED from 06/17/2021 in [facility] at [place] No Risk No Risk No Risk        Total Time spent with patient: 45 minutes  Musculoskeletal  Strength & Muscle Tone: within normal limits Gait & Station: normal Patient leans: N/A  Psychiatric Specialty Exam  Presentation General Appearance:  Appropriate for Environment  Eye Contact: Good  Speech: Clear and Coherent  Speech Volume: Normal  Handedness: Right   Mood and Affect  Mood: Euthymic  Affect: Appropriate; Congruent   Thought Process  Thought Processes: Coherent; Goal Directed; Linear  Descriptions of Associations:Intact  Orientation:Full (  Time, Place and Person)  Thought Content:WDL  Diagnosis of Schizophrenia or Schizoaffective disorder in past: No   Hallucinations:Hallucinations: None  Ideas of Reference:None  Suicidal Thoughts:Suicidal Thoughts: No  Homicidal Thoughts:Homicidal Thoughts:  No   Sensorium  Memory: Immediate Good; Recent Good; Remote Good  Judgment: Good  Insight: Good   Executive Functions  Concentration: Good  Attention Span: Good  Recall: Good  Fund of Knowledge: Good  Language: Good   Psychomotor Activity  Psychomotor Activity: Psychomotor Activity: Normal   Assets  Assets: Communication Skills; Desire for Improvement; Financial Resources/Insurance   Sleep  Sleep: Sleep: Good    Physical Exam Vitals reviewed.  Constitutional:      Appearance: Normal appearance.  HENT:     Head: Normocephalic.     Nose: Nose normal.     Mouth/Throat:     Mouth: Mucous membranes are moist.  Eyes:     Extraocular Movements: Extraocular movements intact.     Conjunctiva/sclera: Conjunctivae normal.     Pupils: Pupils are equal, round, and reactive to light.  Cardiovascular:     Rate and Rhythm: Normal rate and regular rhythm.  Pulmonary:     Effort: Pulmonary effort is normal.     Breath sounds: Normal breath sounds.  Musculoskeletal:     Cervical back: Normal range of motion.  Skin:    General: Skin is warm.  Neurological:     General: No focal deficit present.     Mental Status: He is alert.    Review of Systems  Psychiatric/Behavioral:  Positive for depression and substance abuse. The patient is nervous/anxious and has insomnia.     Blood pressure (!) 131/94, pulse 75, temperature (!) 97.4 F (36.3 C), temperature source Oral, resp. rate 17, SpO2 98 %. There is no height or weight on file to calculate BMI.  Past Psychiatric History: Self reported history of depression and anxiety   Is the patient at risk to self? No  Has the patient been a risk to self in the past 6 months? No .    Has the patient been a risk to self within the distant past? No   Is the patient a risk to others? No   Has the patient been a risk to others in the past 6 months? No   Has the patient been a risk to others within the distant past? No    Past Medical History: Atrial Fibrillation, Rheumatoid Arthritis, Hypertension   Social History: Lives with stepfather and nephew. He is engaged, never married.  Blake Arnold is currently in [place]. He is employed, full-time.  Last Labs:  Office Visit on 04/23/2022  Component Date Value Ref Range Status   Influenza A, POC 04/23/2022 Negative  Negative Final   Influenza B, POC 04/23/2022 Negative  Negative Final   SARS Coronavirus 2 Ag 04/23/2022 Negative  Negative Final    Allergies: Lisinopril  Medications:  Facility Ordered Medications  Medication   acetaminophen (TYLENOL) tablet 650 mg   magnesium hydroxide (MILK OF MAGNESIA) suspension 30 mL   hydrOXYzine (ATARAX) tablet 25 mg   traZODone (DESYREL) tablet 50 mg   thiamine (VITAMIN B1) injection 100 mg   [START ON 05/03/2022] thiamine (VITAMIN B1) tablet 100 mg   multivitamin with minerals tablet 1 tablet   LORazepam (ATIVAN) tablet 1 mg   loperamide (IMODIUM) capsule 2-4 mg   ondansetron (ZOFRAN-ODT) disintegrating tablet 4 mg   PTA Medications  Medication Sig   Adalimumab (HUMIRA) 40 MG/0.8ML PSKT Inject 0.8 mLs (  40 mg total) into the skin once a week. tuesdays (Patient taking differently: Inject 40 mg into the skin every Sunday.)   amLODipine (NORVASC) 5 MG tablet TAKE 1 TABLET(5 MG) BY MOUTH AT BEDTIME (Patient taking differently: Take 5 mg by mouth at bedtime.)   diclofenac Sodium (VOLTAREN) 1 % GEL Apply 1 application topically 3 (three) times daily. (Patient taking differently: Apply 2 g topically 3 (three) times daily as needed (for pain- affected sites).)   acetaminophen (TYLENOL) 500 MG tablet Take 500-1,000 mg by mouth every 8 (eight) hours as needed for mild pain or headache.   simvastatin (ZOCOR) 40 MG tablet TAKE 1 TABLET(40 MG) BY MOUTH DAILY (Patient taking differently: Take 40 mg by mouth at bedtime.)   loratadine (CLARITIN) 10 MG tablet Take 10 mg by mouth daily as needed for rhinitis or allergies.   ibuprofen  (ADVIL) 800 MG tablet Take 1 tablet (800 mg total) by mouth 3 (three) times daily.   gabapentin (NEURONTIN) 300 MG capsule TAKE 2 CAPSULES(600 MG) BY MOUTH THREE TIMES DAILY   nicotine (NICODERM CQ - DOSED IN MG/24 HOURS) 21 mg/24hr patch Place 1 patch (21 mg total) onto the skin daily.   miconazole (MICOTIN) 2 % cream Apply 1 Application topically 2 (two) times daily. Use until 2-3 days after the rash has resolved   hydrochlorothiazide (HYDRODIURIL) 25 MG tablet TAKE 1 TABLET(25 MG) BY MOUTH DAILY   benzonatate (TESSALON) 100 MG capsule Take 1 capsule (100 mg total) by mouth every 8 (eight) hours.   promethazine-dextromethorphan (PROMETHAZINE-DM) 6.25-15 MG/5ML syrup Take 5 mLs by mouth at bedtime as needed for cough.   albuterol (VENTOLIN HFA) 108 (90 Base) MCG/ACT inhaler Inhale 1-2 puffs into the lungs every 6 (six) hours as needed for wheezing or shortness of breath.   naltrexone (DEPADE) 50 MG tablet Take 1 tablet (50 mg total) by mouth daily.   metoprolol tartrate (LOPRESSOR) 50 MG tablet TAKE 1 TABLET(50 MG) BY MOUTH TWICE DAILY   ondansetron (ZOFRAN) 4 MG tablet Take 1 tablet (4 mg total) by mouth every 8 (eight) hours as needed for nausea or vomiting.    Long Term Goals: Improvement in symptoms so as ready for discharge and Abstaining from alcohol use   Short Term Goals: Patient will verbalize feelings in meetings with treatment team members., Patient will attend at least of 50% of the groups daily., Pt will complete the PHQ9 on admission, day 3 and discharge., Patient will participate in completing the [place], and Patient will take medications as prescribed daily.  Medical Decision Making  Admit to [facility] for detox. Will continue Naltrexone for AUD. ATIVAN Detox protocol with CIWA ordered. Continue home Gabapentin, metoprolol, HCTZ, Amlodipine. ECG: Rate 68, NSR, QTC 429 Recommendations  Based on my evaluation the patient does not appear to have an  emergency medical condition. Patient admitted to [facility] for alcohol Detox. Medically stable. Will continue to monitor.   Molli Barrows, FNP-C, PMHNP-BC  [place] [facility] [phone]  05/02/22  10:57 AM
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
## 2022-05-03 DIAGNOSIS — F1020 Alcohol dependence, uncomplicated: Secondary | ICD-10-CM | POA: Diagnosis not present

## 2022-05-03 DIAGNOSIS — F1490 Cocaine use, unspecified, uncomplicated: Secondary | ICD-10-CM | POA: Diagnosis not present

## 2022-05-03 DIAGNOSIS — I10 Essential (primary) hypertension: Secondary | ICD-10-CM | POA: Diagnosis not present

## 2022-05-03 DIAGNOSIS — I4891 Unspecified atrial fibrillation: Secondary | ICD-10-CM | POA: Diagnosis not present

## 2022-05-03 MED ORDER — LORAZEPAM 1 MG PO TABS
1.0000 mg | ORAL_TABLET | Freq: Four times a day (QID) | ORAL | Status: AC
  Administered 2022-05-03 (×4): 1 mg via ORAL
  Filled 2022-05-03 (×4): qty 1

## 2022-05-03 MED ORDER — LORAZEPAM 1 MG PO TABS: 1.0000 mg | ORAL_TABLET | Freq: Every day | ORAL | Status: DC

## 2022-05-03 MED ORDER — LORAZEPAM 1 MG PO TABS
1.0000 mg | ORAL_TABLET | Freq: Two times a day (BID) | ORAL | Status: DC
  Filled 2022-05-03: qty 1

## 2022-05-03 MED ORDER — THIAMINE MONONITRATE 100 MG PO TABS: 100.0000 mg | ORAL_TABLET | Freq: Every day | ORAL | Status: DC

## 2022-05-03 MED ORDER — ADULT MULTIVITAMIN W/MINERALS CH
1.0000 | ORAL_TABLET | Freq: Every day | ORAL | Status: DC
  Administered 2022-05-04 – 2022-05-05 (×2): 1 via ORAL
  Filled 2022-05-03 (×2): qty 1

## 2022-05-03 MED ORDER — LORAZEPAM 1 MG PO TABS
1.0000 mg | ORAL_TABLET | Freq: Three times a day (TID) | ORAL | Status: AC
  Administered 2022-05-04 (×2): 1 mg via ORAL
  Filled 2022-05-03 (×3): qty 1

## 2022-05-03 MED ORDER — HYDROXYZINE HCL 25 MG PO TABS: 25.0000 mg | ORAL_TABLET | Freq: Four times a day (QID) | ORAL | Status: DC | PRN

## 2022-05-03 MED ORDER — THIAMINE HCL 100 MG/ML IJ SOLN: 100.0000 mg | Freq: Once | INTRAMUSCULAR | Status: DC

## 2022-05-03 MED ORDER — ONDANSETRON 4 MG PO TBDP: 4.0000 mg | ORAL_TABLET | Freq: Four times a day (QID) | ORAL | Status: DC | PRN

## 2022-05-03 MED ORDER — LOPERAMIDE HCL 2 MG PO CAPS: 2.0000 mg | ORAL_CAPSULE | ORAL | Status: DC | PRN

## 2022-05-03 MED ORDER — LORAZEPAM 1 MG PO TABS: 1.0000 mg | ORAL_TABLET | Freq: Four times a day (QID) | ORAL | Status: DC | PRN

## 2022-05-03 NOTE — ED Notes (Signed)
Pt is in the bed sleeping. Respirations are even and unlabored. No acute distress noted. Will continue to monitor for safety. 

## 2022-05-03 NOTE — ED Notes (Signed)
Patient is sleeping. Respirations equal and unlabored, skin warm and dry, NAD. No change in assessment or acuity. Routine safety checks conducted according to facility protocol. Will continue to monitor for safety.   

## 2022-05-03 NOTE — Progress Notes (Signed)
Pt is resting quietly in his room. No distress noted or concerns voiced. Staff will monitor for pt's safety.

## 2022-05-03 NOTE — ED Notes (Signed)
SPIRITUALITY GROUP NOTE  Spirituality group facilitated by Simone Curia, MDiv, [place].  Group Description: Group focused on topic of hope. Patients participated in facilitated discussion around topic, connecting with one another around experiences and definitions for hope. Group members engaged with visual explorer photos, reflecting on what hope looks like for them today. Group engaged in discussion around how their definitions of hope are present today in hospital.  Modalities: Psycho-social ed, Adlerian, Narrative, MI  Patient Progress: Present throughout group.  Noted hope in changing his living situation and spoke with the group about the steps that are involved in this.  Received support from other group members.

## 2022-05-03 NOTE — Progress Notes (Signed)
Pt is awake, alert and oriented X4. Pt complained of "shakes." PRN Hydroxyzine and scheduled meds administered with no issue. Pt denies pain and current SI/HI/AVH, plan or intent. Staff will monitor for pt's safety.

## 2022-05-03 NOTE — Group Note (Signed)
Group Topic: Relapse and Recovery  Group Date: 05/03/2022 Start Time: 0800 End Time: 0830 Facilitators: Mervyn Skeeters D, NT  Department: [facility]  Number of Participants: 8  Group Focus: activities of daily living skills, communication, coping skills, individual meeting, and relapse prevention Treatment Modality:  Psychoeducation Interventions utilized were patient education Purpose: enhance coping skills, express feelings, improve communication skills, and relapse prevention strategies  Name: Blake Arnold Date of Birth: 08-Feb-1971  MR: [id number]    Level of Participation: moderate Quality of Participation: attentive, cooperative, and engaged Interactions with others: gave feedback Mood/Affect: appropriate Triggers (if applicable): n/a Cognition: coherent/clear Progress: Moderate Response: n/a Plan: follow-up needed  Patients Problems:  Patient Active Problem List   Diagnosis Date Noted   Gastroenteritis 04/23/2022   AVN (avascular necrosis of bone) ([place]) 08/02/2021   History of hepatitis C 04/12/2021   Alcohol use disorder, severe, dependence ([place]) 04/12/2021   Chronic pain 11/09/2020   Tooth abscess 01/14/2020   Tongue lesion 10/11/2019   Carpal tunnel syndrome 08/24/2019   Onychomycosis 08/24/2019   Cervicalgia 06/02/2019   Left hip pain 04/06/2019   Abdominal pain 02/28/2019   Shoulder pain 12/17/2018   Hyperlipidemia 09/01/2018   HTN (hypertension) 07/24/2017   Seizure ([place]) 07/24/2017   RA (rheumatoid arthritis) ([place]) 07/24/2017   Numbness in feet 03/04/2016   Degenerative joint disease 12/28/2015   OSA (obstructive sleep apnea) 11/23/2015   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

## 2022-05-03 NOTE — ED Notes (Signed)
Pt encouraged to get up and attend chaplin's group

## 2022-05-03 NOTE — ED Provider Notes (Signed)
Behavioral Health Progress Note   Date and Time: 05/03/2022 12:37 PM Name: Blake Arnold MRN:  [id number]   Subjective:   The patient is a 52 year old male with a history of alcohol and cocaine use.  Several previous behavioral health admissions the last one was in 2015 for alcohol use disorder.  The patient presented to the [facility] requesting help with alcohol and cocaine use.  He was admitted to the facility based crisis.  On assessment 3/22, the patient exhibits a linear and logical thought process.  He exhibits an appropriate affect.  Pertinent social history is obtained as follows.  The patient lives with his stepfather, Eddie Dibbles, as well as his great nephew Darlyn Chamber, who is 72 years old.  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  He states that he has a girlfriend in a long-distance relationship the past 5 years.  The patient reports a history of alcohol withdrawal symptoms.  He reports a seizure that does not appear to involve generalized tonic-clonic activity.  The patient states that he has to do outpatient treatment because he has to go back to work and make money.  Discussed FMLA with him and the potential for residential rehab.  The patient declined this.  The patient denies auditory/visual hallucinations.  The patient reports good mood, appetite, and sleep. They deny suicidal and homicidal thoughts. The patient denies side effects from their medications.  Review of systems as below. The patient denies experiencing any withdrawal symptoms.       Diagnosis:  Final diagnoses:  Alcohol use disorder, severe, dependence (HCC)  Cocaine use      Total Time spent with patient: 20 minutes   Past Psychiatric History: MDD, anxiety, EtOH disorder, crack cocaine use disorder, tobacco use disorder Past Medical History: afib, RA, HLD, OSA, HCV Family History: unable to  assess Family Psychiatric  History:  unable to assess Social History:              -Childhood: Grew up in [place], [place]             -Lives with step dad and nephew.             -Relationships: fiance lives in [place]. Patient is planning on saving money to bring her to the [place]. It is unclear if they have ever met in person. Otherwise never married.             -Financial: Patient reports having poor credit and debt             -Employment: works as a Secondary school teacher for [employer], reports work is stressful and he is planning on quitting soon             -Drug use                         -EtOH: Drinks a fifth of liquor a day                         -Cocaine: Uses 1g/day of crack                         -Methamphetamine: "accidentally" used methamphetamines the day before admission, but vigorously denies using meth otherwise                         -  Tobacco: 1ppd, denies cravings, reports planning to quitting upon discharge                         -Denies drug use otherwise   Additional Social History:  Pain Medications: SEE MAR Prescriptions: SEE MAR Over the Counter: SEE MAR History of alcohol / drug use?: Yes Longest period of sobriety (when/how long): 1 1/2 in 2012 Negative Consequences of Use: Financial, Personal relationships, Work / School Withdrawal Symptoms: Tremors, Irritability, Seizures, Cramps, Sweats, Diarrhea, Nausea / Vomiting Onset of Seizures: 2006 Date of most recent seizure: 2006 Name of Substance 1: ALCOHOL 1 - Age of First Use: 13 1 - Amount (size/oz): FIFTH OF LIQOUR AND A FEW BEERS 1 - Frequency: DAILY 1 - Duration: ONGOING 1 - Last Use / Amount: 05/02/22-TWO 25 OUNCE BEER 1 - Method of Aquiring: BUYING 1- Route of Use: ORAL Name of Substance 2: COCAINE 2 - Age of First Use: 17 2 - Amount (size/oz): 1 GRAM 2 - Frequency: DAILY 2 - Duration: ONGOING 2 - Last Use / Amount: 04/30/21/GRAM 2 - Method of Aquiring: BUYING 2 - Route of Substance Use: SMOKING    Sleep: Good   Appetite:  Good   Current Medications:           Current Facility-Administered Medications  Medication Dose Route Frequency Provider Last Rate Last Admin   acetaminophen (TYLENOL) tablet 650 mg  650 mg Oral Q6H PRN Scot Jun, NP       amLODipine (NORVASC) tablet 10 mg  10 mg Oral Daily Scot Jun, NP   10 mg at 05/03/22 0905   gabapentin (NEURONTIN) capsule 300 mg  300 mg Oral TID Scot Jun, NP   300 mg at 05/03/22 [id number]   hydrochlorothiazide (HYDRODIURIL) tablet 25 mg  25 mg Oral Daily Scot Jun, NP   25 mg at 05/03/22 [id number]   hydrOXYzine (ATARAX) tablet 25 mg  25 mg Oral Q6H PRN Corky Sox, MD       loperamide (IMODIUM) capsule 2-4 mg  2-4 mg Oral PRN Corky Sox, MD       LORazepam (ATIVAN) tablet 1 mg  1 mg Oral Q6H PRN Corky Sox, MD       LORazepam (ATIVAN) tablet 1 mg  1 mg Oral QID Corky Sox, MD   1 mg at 05/03/22 1125    Followed by   Derrill Memo ON 05/04/2022] LORazepam (ATIVAN) tablet 1 mg  1 mg Oral TID Corky Sox, MD        Followed by   Derrill Memo ON 05/05/2022] LORazepam (ATIVAN) tablet 1 mg  1 mg Oral BID Corky Sox, MD        Followed by   Derrill Memo ON 05/06/2022] LORazepam (ATIVAN) tablet 1 mg  1 mg Oral Daily Corky Sox, MD       magnesium hydroxide (MILK OF MAGNESIA) suspension 30 mL  30 mL Oral Daily PRN Scot Jun, NP       metoprolol tartrate (LOPRESSOR) tablet 25 mg  25 mg Oral BID Scot Jun, NP   25 mg at 05/03/22 [id number]   multivitamin with minerals tablet 1 tablet  1 tablet Oral Daily Corky Sox, MD       naltrexone (DEPADE) tablet 50 mg  50 mg Oral Daily Scot Jun, NP   50 mg at 05/03/22 0905   ondansetron (ZOFRAN-ODT) disintegrating tablet 4 mg  4 mg Oral Q6H PRN Corky Sox, MD  thiamine (VITAMIN B1) tablet 100 mg  100 mg Oral Daily Scot Jun, NP   100 mg at 05/03/22 [id number]   traZODone (DESYREL) tablet 50 mg  50 mg Oral QHS PRN Scot Jun,  NP   50 mg at 05/02/22 2127          Current Outpatient Medications  Medication Sig Dispense Refill   albuterol (VENTOLIN HFA) 108 (90 Base) MCG/ACT inhaler Inhale 1-2 puffs into the lungs every 6 (six) hours as needed for wheezing or shortness of breath. 18 g 0   diclofenac Sodium (VOLTAREN) 1 % GEL Apply 1 application topically 3 (three) times daily. (Patient taking differently: Apply 2 g topically 3 (three) times daily as needed (for pain- affected sites).) 2 g 3   hydrochlorothiazide (HYDRODIURIL) 25 MG tablet TAKE 1 TABLET(25 MG) BY MOUTH DAILY (Patient taking differently: Take 25 mg by mouth daily.) 90 tablet 3   loratadine (CLARITIN) 10 MG tablet Take 10 mg by mouth daily as needed for rhinitis or allergies.       metoprolol tartrate (LOPRESSOR) 50 MG tablet TAKE 1 TABLET(50 MG) BY MOUTH TWICE DAILY (Patient taking differently: Take 50 mg by mouth 2 (two) times daily.) 180 tablet 3   ondansetron (ZOFRAN) 4 MG tablet Take 1 tablet (4 mg total) by mouth every 8 (eight) hours as needed for nausea or vomiting. 20 tablet 0      Labs  Lab Results:         Admission on 05/02/2022  Component Date Value Ref Range Status   SARS Coronavirus 2 by RT PCR 05/02/2022 NEGATIVE  NEGATIVE Final   Influenza A by PCR 05/02/2022 NEGATIVE  NEGATIVE Final   Influenza B by PCR 05/02/2022 NEGATIVE  NEGATIVE Final    Comment: (NOTE) The Xpert Xpress SARS-CoV-2/FLU/RSV plus assay is intended as an aid in the diagnosis of influenza from Nasopharyngeal swab specimens and should not be used as a sole basis for treatment. Nasal washings and aspirates are unacceptable for Xpert Xpress SARS-CoV-2/FLU/RSV testing.   Fact Sheet for Patients: EntrepreneurPulse.com.au   Fact Sheet for Healthcare Providers: IncredibleEmployment.be   This test is not yet approved or cleared by the [place] FDA and has been authorized for detection and/or diagnosis of SARS-CoV-2 by FDA  under an Emergency Use Authorization (EUA). This EUA will remain in effect (meaning this test can be used) for the duration of the COVID-19 declaration under Section 564(b)(1) of the Act, 21 U.S.C. section 360bbb-3(b)(1), unless the authorization is terminated or revoked.       Resp Syncytial Virus by PCR 05/02/2022 NEGATIVE  NEGATIVE Final    Comment: (NOTE) Fact Sheet for Patients: EntrepreneurPulse.com.au   Fact Sheet for Healthcare Providers: IncredibleEmployment.be   This test is not yet approved or cleared by the [place] FDA and has been authorized for detection and/or diagnosis of SARS-CoV-2 by FDA under an Emergency Use Authorization (EUA). This EUA will remain in effect (meaning this test can be used) for the duration of the COVID-19 declaration under Section 564(b)(1) of the Act, 21 U.S.C. section 360bbb-3(b)(1), unless the authorization is terminated or revoked.   Performed at [facility], [place] [address] [place], [place] [address]     WBC 05/02/2022 7.6  4.0 - 10.5 K/uL Final   RBC 05/02/2022 5.56  4.22 - 5.81 MIL/uL Final   Hemoglobin 05/02/2022 18.1 (H)  13.0 - 17.0 g/dL Final   HCT 05/02/2022 52.1 (H)  39.0 - 52.0 % Final  MCV 05/02/2022 93.7  80.0 - 100.0 fL Final   MCH 05/02/2022 32.6  26.0 - 34.0 pg Final   MCHC 05/02/2022 34.7  30.0 - 36.0 g/dL Final    CORRECTED FOR COLD AGGLUTININS   RDW 05/02/2022 13.1  11.5 - 15.5 % Final   Platelets 05/02/2022 260  150 - 400 K/uL Final    REPEATED TO VERIFY   nRBC 05/02/2022 0.0  0.0 - 0.2 % Final   Neutrophils Relative % 05/02/2022 52  % Final   Neutro Abs 05/02/2022 3.9  1.7 - 7.7 K/uL Final   Lymphocytes Relative 05/02/2022 35  % Final   Lymphs Abs 05/02/2022 2.7  0.7 - 4.0 K/uL Final   Monocytes Relative 05/02/2022 9  % Final   Monocytes Absolute 05/02/2022 0.7  0.1 - 1.0 K/uL Final   Eosinophils Relative 05/02/2022 2  % Final   Eosinophils Absolute 05/02/2022  0.2  0.0 - 0.5 K/uL Final   Basophils Relative 05/02/2022 1  % Final   Basophils Absolute 05/02/2022 0.1  0.0 - 0.1 K/uL Final   Immature Granulocytes 05/02/2022 1  % Final   Abs Immature Granulocytes 05/02/2022 0.04  0.00 - 0.07 K/uL Final    Performed at [facility], [place] [address] [place], [place] [address]   Sodium 05/02/2022 135  135 - 145 mmol/L Final   Potassium 05/02/2022 3.6  3.5 - 5.1 mmol/L Final   Chloride 05/02/2022 99  98 - 111 mmol/L Final   CO2 05/02/2022 25  22 - 32 mmol/L Final   Glucose, Bld 05/02/2022 105 (H)  70 - 99 mg/dL Final    Glucose reference range applies only to samples taken after fasting for at least 8 hours.   BUN 05/02/2022 9  6 - 20 mg/dL Final   Creatinine, Ser 05/02/2022 0.75  0.61 - 1.24 mg/dL Final   Calcium 05/02/2022 8.8 (L)  8.9 - 10.3 mg/dL Final   Total Protein 05/02/2022 7.8  6.5 - 8.1 g/dL Final   Albumin 05/02/2022 3.9  3.5 - 5.0 g/dL Final   AST 05/02/2022 26  15 - 41 U/L Final   ALT 05/02/2022 62 (H)  0 - 44 U/L Final   Alkaline Phosphatase 05/02/2022 57  38 - 126 U/L Final   Total Bilirubin 05/02/2022 1.1  0.3 - 1.2 mg/dL Final   GFR, Estimated 05/02/2022 >60  >60 mL/min Final    Comment: (NOTE) Calculated using the CKD-EPI Creatinine Equation (2021)     Anion gap 05/02/2022 11  5 - 15 Final    Performed at [facility] [address], [place], [place] [address]   Hgb A1c MFr Bld 05/02/2022 5.3  4.8 - 5.6 % Final    Comment: (NOTE)         Prediabetes: 5.7 - 6.4         Diabetes: >6.4         Glycemic control for adults with diabetes: <7.0     Mean Plasma Glucose 05/02/2022 105  mg/dL Final    Comment: (NOTE) Performed At: [facility] [place], [place] [id number] Rush Farmer MD [id number]     Alcohol, Ethyl (B) 05/02/2022 233 (H)  <10 mg/dL Final    Comment: (NOTE) Lowest detectable limit for serum alcohol is 10 mg/dL.   For medical purposes only. Performed at [facility], [place] [address] [place], [place] [address]     POC Amphetamine UR 05/02/2022 None Detected  NONE DETECTED (Cut Off Level 1000 ng/mL) Final   POC Secobarbital (BAR) 05/02/2022 None Detected  NONE DETECTED (Cut Off Level 300 ng/mL) Final   POC Buprenorphine (BUP) 05/02/2022 None Detected  NONE DETECTED (Cut Off Level 10 ng/mL) Final   POC Oxazepam (BZO) 05/02/2022 None Detected  NONE DETECTED (Cut Off Level 300 ng/mL) Final   POC Cocaine UR 05/02/2022 Positive (A)  NONE DETECTED (Cut Off Level 300 ng/mL) Final   POC Methamphetamine UR 05/02/2022 Positive (A)  NONE DETECTED (Cut Off Level 1000 ng/mL) Final   POC Morphine 05/02/2022 None Detected  NONE DETECTED (Cut Off Level 300 ng/mL) Final   POC Methadone UR 05/02/2022 None Detected  NONE DETECTED (Cut Off Level 300 ng/mL) Final   POC Oxycodone UR 05/02/2022 None Detected  NONE DETECTED (Cut Off Level 100 ng/mL) Final   POC Marijuana UR 05/02/2022 None Detected  NONE DETECTED (Cut Off Level 50 ng/mL) Final   SARSCOV2ONAVIRUS 2 AG 05/02/2022 NEGATIVE  NEGATIVE Final    Comment: (NOTE) SARS-CoV-2 antigen NOT DETECTED.    Negative results are presumptive.  Negative results do not preclude SARS-CoV-2 infection and should not be used as the sole basis for treatment or other patient management decisions, including infection  control decisions, particularly in the presence of clinical signs and  symptoms consistent with COVID-19, or in those who have been in contact with the virus.  Negative results must be combined with clinical observations, patient history, and epidemiological information. The expected result is Negative.   Fact Sheet for Patients: HandmadeRecipes.com.cy   Fact Sheet for Healthcare Providers: FuneralLife.at   This test is not yet approved or cleared by the [place] FDA and  has been authorized for detection and/or diagnosis of SARS-CoV-2 by FDA under an Emergency Use  Authorization (EUA).  This EUA will remain in effect (meaning this test can be used) for the duration of  the COV                           ID-19 declaration under Section 564(b)(1) of the Act, 21 U.S.C. section 360bbb-3(b)(1), unless the authorization is terminated or revoked sooner.       Cholesterol 05/02/2022 196  0 - 200 mg/dL Final   Triglycerides 05/02/2022 517 (H)  <150 mg/dL Final   HDL 05/02/2022 50  >40 mg/dL Final   Total CHOL/HDL Ratio 05/02/2022 3.9  RATIO Final   VLDL 05/02/2022 UNABLE TO CALCULATE IF TRIGLYCERIDE OVER 400 mg/dL  0 - 40 mg/dL Final   LDL Cholesterol 05/02/2022 UNABLE TO CALCULATE IF TRIGLYCERIDE OVER 400 mg/dL  0 - 99 mg/dL Final    Comment:        Total Cholesterol/HDL:CHD Risk Coronary Heart Disease Risk Table                     Men   Women  1/2 Average Risk   3.4   3.3  Average Risk       5.0   4.4  2 X Average Risk   9.6   7.1  3 X Average Risk  23.4   11.0        Use the calculated Patient Ratio above and the CHD Risk Table to determine the patient's CHD Risk.        ATP III CLASSIFICATION (LDL):  <100     mg/dL   Optimal  100-129  mg/dL   Near or Above  Optimal  130-159  mg/dL   Borderline  160-189  mg/dL   High  >190     mg/dL   Very High Performed at [facility] [address] [place], [place] [address]     TSH 05/02/2022 1.885  0.350 - 4.500 uIU/mL Final    Comment: Performed by a 3rd Generation assay with a functional sensitivity of <=0.01 uIU/mL. Performed at [facility], [place] [address], [place], [place] [address]     Direct LDL 05/02/2022 76  0 - 99 mg/dL Final    Performed at [facility] [address], [place],  [address]  Office Visit on 04/23/2022  Component Date Value Ref Range Status   Influenza A, POC 04/23/2022 Negative  Negative Final   Influenza B, POC 04/23/2022 Negative  Negative Final   SARS Coronavirus 2 Ag 04/23/2022 Negative  Negative Final      Blood Alcohol  level:  Recent Labs       Lab Results  Component Value Date    ETH 233 (H) 05/02/2022    ETH 14 (H) [id number]        Metabolic Disorder Labs: Recent Labs       Lab Results  Component Value Date    HGBA1C 5.3 05/02/2022    MPG 105 05/02/2022    MPG 105.41 02/25/2021      Recent Labs  No results found for: "PROLACTIN"   Recent Labs       Lab Results  Component Value Date    CHOL 196 05/02/2022    TRIG 517 (H) 05/02/2022    HDL 50 05/02/2022    CHOLHDL 3.9 05/02/2022    VLDL UNABLE TO CALCULATE IF TRIGLYCERIDE OVER 400 mg/dL 05/02/2022    LDLCALC UNABLE TO CALCULATE IF TRIGLYCERIDE OVER 400 mg/dL 05/02/2022    [place] 98 02/25/2021        Therapeutic Lab Levels: Recent Labs  No results found for: "LITHIUM"   Recent Labs       Lab Results  Component Value Date    VALPROATE 50.6 10/23/2012    VALPROATE 18.6 (L) 06/30/2012      Recent Labs  No results found for: "CBMZ"     Physical Findings    AUDIT     Flowsheet Row Admission (Discharged) from 07/26/2013 in [facility] Admission (Discharged) from 02/18/2013 in [facility] Admission (Discharged) from 11/27/2012 in [facility] Admission (Discharged) from 10/24/2012 in [facility] Admission (Discharged) from 06/08/2012 in [facility]  Alcohol Use Disorder Identification Test Final Score (AUDIT) 33 36 31 35 38         PHQ2-9     [facility] ED from 05/02/2022 in [facility] Office Visit from 01/21/2022 in [facility] Office Visit from 09/24/2021 in [facility] Office Visit from 09/04/2021 in [facility] Office Visit from 08/01/2021 in [facility]  PHQ-2 Total Score 2 0 1 0 0  PHQ-9 Total Score 6 1 5 4 5          Flowsheet Row ED from  05/02/2022 in [facility] ED from 02/12/2022 in [facility] ED from 06/17/2021 in [facility] [place] No Risk No Risk No Risk  Musculoskeletal  Strength & Muscle Tone: within normal limits Gait & Station: normal Patient leans: N/A   Psychiatric Specialty Exam  Presentation  General Appearance:  Disheveled   Eye Contact: Good   Speech: Clear and Coherent; Normal Rate   Speech Volume: Normal   Handedness: Right     Mood and Affect  Mood: Euthymic   Affect: Congruent     Thought Process  Thought Processes: Linear   Descriptions of Associations:Intact   Orientation:Full (Time, Place and Person)   Thought Content:Logical  Diagnosis of Schizophrenia or Schizoaffective disorder in past: No    Hallucinations:Hallucinations: None   Ideas of Reference:None   Suicidal Thoughts:Suicidal Thoughts: No   Homicidal Thoughts:Homicidal Thoughts: No     Sensorium  Memory: Immediate Good; Recent Good; Remote Good   Judgment: Poor   Insight: Poor     Executive Functions  Concentration: Good   Attention Span: Good   Recall: Good   Fund of Knowledge: Good   Language: Good     Psychomotor Activity  Psychomotor Activity: Psychomotor Activity: Normal     Assets  Assets: Transportation; Housing     Sleep  Sleep: Sleep: Good     No data recorded   Physical Exam  Physical Exam Constitutional:      Appearance: NAD HENT:     Head: Normocephalic and atraumatic.  Pulmonary:     Effort: Pulmonary effort is normal. No respiratory distress.  Neurological:     General: No focal deficit present.     Mental Status: He is alert.  Psychiatric:        Mood and Affect: Mood normal.        Thought Content: Thought content normal.    Review of Systems  Constitutional:  Negative for chills and fever.  Respiratory:  Negative for cough and  hemoptysis.   Gastrointestinal:  Negative for blood in stool and melena.  Psychiatric/Behavioral:  Positive for substance abuse. Negative for depression, hallucinations and suicidal ideas. The patient is not nervous/anxious and does not have insomnia.     Blood pressure (!) 136/96, pulse (!) 110, temperature 97.8 F (36.6 C), temperature source Oral, resp. rate 16, SpO2 97 %. There is no height or weight on file to calculate BMI.   Treatment Plan Summary: Daily contact with patient to assess and evaluate symptoms and progress in treatment, Medication management, and Plan    #EtOH Use Disorder Ativan taper with CIWA and as needed Ativan Continue naltrexone  Medical: - Lab work unremarkable - Elevated hemoglobin, may have sleep apnea  Corky Sox, MD PGY-2

## 2022-05-03 NOTE — Progress Notes (Signed)
Pt's CIWA was 0,

## 2022-05-04 DIAGNOSIS — I4891 Unspecified atrial fibrillation: Secondary | ICD-10-CM | POA: Diagnosis not present

## 2022-05-04 DIAGNOSIS — F1490 Cocaine use, unspecified, uncomplicated: Secondary | ICD-10-CM | POA: Diagnosis not present

## 2022-05-04 DIAGNOSIS — F1020 Alcohol dependence, uncomplicated: Secondary | ICD-10-CM | POA: Diagnosis not present

## 2022-05-04 DIAGNOSIS — I10 Essential (primary) hypertension: Secondary | ICD-10-CM | POA: Diagnosis not present

## 2022-05-04 NOTE — ED Provider Notes (Signed)
Behavioral Health Progress Note   Date and Time: 05/03/2022 12:37 PM Name: Blake Arnold MRN:  [id number]   Subjective:   The patient is a 52 year old male with a history of alcohol and cocaine use.  Several previous behavioral health admissions the last one was in 2015 for alcohol use disorder.  The patient presented to the [facility] on 05/02/2022 requesting help with alcohol and cocaine use.  He was admitted to the facility based crisis.  On assessment 3/23, patient observed sitting in the day room watching TV.  He is pleasant and cooperative.  He reports that he is feeling "good".  He is denying alcohol withdrawal symptoms.  He reports the Ativan taper has been helpful and he is requesting to be discharged in the a.m.  Reports he needs to get back to work on Monday.  He is concerned about relapse due to returning to his stepfather's house with his 87 year old great nephew who he identifies is a Psychologist, clinical.  He plans to move.  He also reports that naltrexone is helpful in reducing his alcohol cravings.  States he has taken this medication in the past.  He does not need a prescription upon discharge due to having medications at home.  He is interested in the [facility] IOP program.  A referral will be made. He denies any concerns with appetite or sleep.  He is denying any depression at this time states he feels slightly anxious.  he continues to deny SI/HI/AVH.  He verbally contracts for safety.  He is forward thinking and is planning a trip to [place] to visit his girlfriend.     Diagnosis:  Final diagnoses:  Alcohol use disorder, severe, dependence (HCC)  Cocaine use      Total Time spent with patient: 20 minutes   Past Psychiatric History: MDD, anxiety, EtOH disorder, crack cocaine use disorder, tobacco use disorder Past Medical History: afib, RA, HLD, OSA, HCV Family History: unable to assess Family Psychiatric  History:  unable to assess Social History:               -Childhood: Grew up in [place], [place]             -Lives with step dad and nephew.             -Relationships: fiance lives in [place]. Patient is planning on saving money to bring her to the [place]. It is unclear if they have ever met in person. Otherwise never married.             -Financial: Patient reports having poor credit and debt             -Employment: works as a Secondary school teacher for [employer], reports work is stressful and he is planning on quitting soon             -Drug use                         -EtOH: Drinks a fifth of liquor a day                         -Cocaine: Uses 1g/day of crack                         -Methamphetamine: "accidentally" used methamphetamines the day before admission, but vigorously denies using meth otherwise                         -  Tobacco: 1ppd, denies cravings, reports planning to quitting upon discharge                         -Denies drug use otherwise   Additional Social History:  Pain Medications: SEE MAR Prescriptions: SEE MAR Over the Counter: SEE MAR History of alcohol / drug use?: Yes Longest period of sobriety (when/how long): 1 1/2 in 2012 Negative Consequences of Use: Financial, Personal relationships, Work / School Withdrawal Symptoms: Tremors, Irritability, Seizures, Cramps, Sweats, Diarrhea, Nausea / Vomiting Onset of Seizures: 2006 Date of most recent seizure: 2006 Name of Substance 1: ALCOHOL 1 - Age of First Use: 13 1 - Amount (size/oz): FIFTH OF LIQOUR AND A FEW BEERS 1 - Frequency: DAILY 1 - Duration: ONGOING 1 - Last Use / Amount: 05/02/22-TWO 25 OUNCE BEER 1 - Method of Aquiring: BUYING 1- Route of Use: ORAL Name of Substance 2: COCAINE 2 - Age of First Use: 17 2 - Amount (size/oz): 1 GRAM 2 - Frequency: DAILY 2 - Duration: ONGOING 2 - Last Use / Amount: 04/30/21/GRAM 2 - Method of Aquiring: BUYING 2 - Route of Substance Use: SMOKING   Sleep: Good   Appetite:  Good   Current Medications:           Current  Facility-Administered Medications  Medication Dose Route Frequency Provider Last Rate Last Admin   acetaminophen (TYLENOL) tablet 650 mg  650 mg Oral Q6H PRN Scot Jun, NP       amLODipine (NORVASC) tablet 10 mg  10 mg Oral Daily Scot Jun, NP   10 mg at 05/03/22 0905   gabapentin (NEURONTIN) capsule 300 mg  300 mg Oral TID Scot Jun, NP   300 mg at 05/03/22 [id number]   hydrochlorothiazide (HYDRODIURIL) tablet 25 mg  25 mg Oral Daily Scot Jun, NP   25 mg at 05/03/22 [id number]   hydrOXYzine (ATARAX) tablet 25 mg  25 mg Oral Q6H PRN Corky Sox, MD       loperamide (IMODIUM) capsule 2-4 mg  2-4 mg Oral PRN Corky Sox, MD       LORazepam (ATIVAN) tablet 1 mg  1 mg Oral Q6H PRN Corky Sox, MD       LORazepam (ATIVAN) tablet 1 mg  1 mg Oral QID Corky Sox, MD   1 mg at 05/03/22 1125    Followed by   Derrill Memo ON 05/04/2022] LORazepam (ATIVAN) tablet 1 mg  1 mg Oral TID Corky Sox, MD        Followed by   Derrill Memo ON 05/05/2022] LORazepam (ATIVAN) tablet 1 mg  1 mg Oral BID Corky Sox, MD        Followed by   Derrill Memo ON 05/06/2022] LORazepam (ATIVAN) tablet 1 mg  1 mg Oral Daily Corky Sox, MD       magnesium hydroxide (MILK OF MAGNESIA) suspension 30 mL  30 mL Oral Daily PRN Scot Jun, NP       metoprolol tartrate (LOPRESSOR) tablet 25 mg  25 mg Oral BID Scot Jun, NP   25 mg at 05/03/22 [id number]   multivitamin with minerals tablet 1 tablet  1 tablet Oral Daily Corky Sox, MD       naltrexone (DEPADE) tablet 50 mg  50 mg Oral Daily Scot Jun, NP   50 mg at 05/03/22 0905   ondansetron (ZOFRAN-ODT) disintegrating tablet 4 mg  4 mg Oral Q6H PRN Corky Sox, MD  thiamine (VITAMIN B1) tablet 100 mg  100 mg Oral Daily Scot Jun, NP   100 mg at 05/03/22 [id number]   traZODone (DESYREL) tablet 50 mg  50 mg Oral QHS PRN Scot Jun, NP   50 mg at 05/02/22 2127          Current Outpatient Medications   Medication Sig Dispense Refill   albuterol (VENTOLIN HFA) 108 (90 Base) MCG/ACT inhaler Inhale 1-2 puffs into the lungs every 6 (six) hours as needed for wheezing or shortness of breath. 18 g 0   diclofenac Sodium (VOLTAREN) 1 % GEL Apply 1 application topically 3 (three) times daily. (Patient taking differently: Apply 2 g topically 3 (three) times daily as needed (for pain- affected sites).) 2 g 3   hydrochlorothiazide (HYDRODIURIL) 25 MG tablet TAKE 1 TABLET(25 MG) BY MOUTH DAILY (Patient taking differently: Take 25 mg by mouth daily.) 90 tablet 3   loratadine (CLARITIN) 10 MG tablet Take 10 mg by mouth daily as needed for rhinitis or allergies.       metoprolol tartrate (LOPRESSOR) 50 MG tablet TAKE 1 TABLET(50 MG) BY MOUTH TWICE DAILY (Patient taking differently: Take 50 mg by mouth 2 (two) times daily.) 180 tablet 3   ondansetron (ZOFRAN) 4 MG tablet Take 1 tablet (4 mg total) by mouth every 8 (eight) hours as needed for nausea or vomiting. 20 tablet 0      Labs  Lab Results:         Admission on 05/02/2022  Component Date Value Ref Range Status   SARS Coronavirus 2 by RT PCR 05/02/2022 NEGATIVE  NEGATIVE Final   Influenza A by PCR 05/02/2022 NEGATIVE  NEGATIVE Final   Influenza B by PCR 05/02/2022 NEGATIVE  NEGATIVE Final    Comment: (NOTE) The Xpert Xpress SARS-CoV-2/FLU/RSV plus assay is intended as an aid in the diagnosis of influenza from Nasopharyngeal swab specimens and should not be used as a sole basis for treatment. Nasal washings and aspirates are unacceptable for Xpert Xpress SARS-CoV-2/FLU/RSV testing.   Fact Sheet for Patients: EntrepreneurPulse.com.au   Fact Sheet for Healthcare Providers: IncredibleEmployment.be   This test is not yet approved or cleared by the [place] FDA and has been authorized for detection and/or diagnosis of SARS-CoV-2 by FDA under an Emergency Use Authorization (EUA). This EUA will remain in effect  (meaning this test can be used) for the duration of the COVID-19 declaration under Section 564(b)(1) of the Act, 21 U.S.C. section 360bbb-3(b)(1), unless the authorization is terminated or revoked.       Resp Syncytial Virus by PCR 05/02/2022 NEGATIVE  NEGATIVE Final    Comment: (NOTE) Fact Sheet for Patients: EntrepreneurPulse.com.au   Fact Sheet for Healthcare Providers: IncredibleEmployment.be   This test is not yet approved or cleared by the [place] FDA and has been authorized for detection and/or diagnosis of SARS-CoV-2 by FDA under an Emergency Use Authorization (EUA). This EUA will remain in effect (meaning this test can be used) for the duration of the COVID-19 declaration under Section 564(b)(1) of the Act, 21 U.S.C. section 360bbb-3(b)(1), unless the authorization is terminated or revoked.   Performed at [facility], [place] [address], [place], [place] [address]     WBC 05/02/2022 7.6  4.0 - 10.5 K/uL Final   RBC 05/02/2022 5.56  4.22 - 5.81 MIL/uL Final   Hemoglobin 05/02/2022 18.1 (H)  13.0 - 17.0 g/dL Final   HCT 05/02/2022 52.1 (H)  39.0 - 52.0 % Final  MCV 05/02/2022 93.7  80.0 - 100.0 fL Final   MCH 05/02/2022 32.6  26.0 - 34.0 pg Final   MCHC 05/02/2022 34.7  30.0 - 36.0 g/dL Final    CORRECTED FOR COLD AGGLUTININS   RDW 05/02/2022 13.1  11.5 - 15.5 % Final   Platelets 05/02/2022 260  150 - 400 K/uL Final    REPEATED TO VERIFY   nRBC 05/02/2022 0.0  0.0 - 0.2 % Final   Neutrophils Relative % 05/02/2022 52  % Final   Neutro Abs 05/02/2022 3.9  1.7 - 7.7 K/uL Final   Lymphocytes Relative 05/02/2022 35  % Final   Lymphs Abs 05/02/2022 2.7  0.7 - 4.0 K/uL Final   Monocytes Relative 05/02/2022 9  % Final   Monocytes Absolute 05/02/2022 0.7  0.1 - 1.0 K/uL Final   Eosinophils Relative 05/02/2022 2  % Final   Eosinophils Absolute 05/02/2022 0.2  0.0 - 0.5 K/uL Final   Basophils Relative 05/02/2022 1  % Final    Basophils Absolute 05/02/2022 0.1  0.0 - 0.1 K/uL Final   Immature Granulocytes 05/02/2022 1  % Final   Abs Immature Granulocytes 05/02/2022 0.04  0.00 - 0.07 K/uL Final    Performed at [facility], [place] [address] [place], [place] [address]   Sodium 05/02/2022 135  135 - 145 mmol/L Final   Potassium 05/02/2022 3.6  3.5 - 5.1 mmol/L Final   Chloride 05/02/2022 99  98 - 111 mmol/L Final   CO2 05/02/2022 25  22 - 32 mmol/L Final   Glucose, Bld 05/02/2022 105 (H)  70 - 99 mg/dL Final    Glucose reference range applies only to samples taken after fasting for at least 8 hours.   BUN 05/02/2022 9  6 - 20 mg/dL Final   Creatinine, Ser 05/02/2022 0.75  0.61 - 1.24 mg/dL Final   Calcium 05/02/2022 8.8 (L)  8.9 - 10.3 mg/dL Final   Total Protein 05/02/2022 7.8  6.5 - 8.1 g/dL Final   Albumin 05/02/2022 3.9  3.5 - 5.0 g/dL Final   AST 05/02/2022 26  15 - 41 U/L Final   ALT 05/02/2022 62 (H)  0 - 44 U/L Final   Alkaline Phosphatase 05/02/2022 57  38 - 126 U/L Final   Total Bilirubin 05/02/2022 1.1  0.3 - 1.2 mg/dL Final   GFR, Estimated 05/02/2022 >60  >60 mL/min Final    Comment: (NOTE) Calculated using the CKD-EPI Creatinine Equation (2021)     Anion gap 05/02/2022 11  5 - 15 Final    Performed at [facility] [address] [place], [place] [address]   Hgb A1c MFr Bld 05/02/2022 5.3  4.8 - 5.6 % Final    Comment: (NOTE)         Prediabetes: 5.7 - 6.4         Diabetes: >6.4         Glycemic control for adults with diabetes: <7.0     Mean Plasma Glucose 05/02/2022 105  mg/dL Final    Comment: (NOTE) Performed At: [facility] [place], [place] [id number] Rush Farmer MD [id number]     Alcohol, Ethyl (B) 05/02/2022 233 (H)  <10 mg/dL Final    Comment: (NOTE) Lowest detectable limit for serum alcohol is 10 mg/dL.   For medical purposes only. Performed at [facility], [place] [address], [place], [place] [address]     POC Amphetamine UR  05/02/2022 None Detected  NONE DETECTED (Cut Off Level 1000 ng/mL) Final   POC Secobarbital (BAR) 05/02/2022 None Detected  NONE DETECTED (Cut Off Level 300 ng/mL) Final   POC Buprenorphine (BUP) 05/02/2022 None Detected  NONE DETECTED (Cut Off Level 10 ng/mL) Final   POC Oxazepam (BZO) 05/02/2022 None Detected  NONE DETECTED (Cut Off Level 300 ng/mL) Final   POC Cocaine UR 05/02/2022 Positive (A)  NONE DETECTED (Cut Off Level 300 ng/mL) Final   POC Methamphetamine UR 05/02/2022 Positive (A)  NONE DETECTED (Cut Off Level 1000 ng/mL) Final   POC Morphine 05/02/2022 None Detected  NONE DETECTED (Cut Off Level 300 ng/mL) Final   POC Methadone UR 05/02/2022 None Detected  NONE DETECTED (Cut Off Level 300 ng/mL) Final   POC Oxycodone UR 05/02/2022 None Detected  NONE DETECTED (Cut Off Level 100 ng/mL) Final   POC Marijuana UR 05/02/2022 None Detected  NONE DETECTED (Cut Off Level 50 ng/mL) Final   SARSCOV2ONAVIRUS 2 AG 05/02/2022 NEGATIVE  NEGATIVE Final    Comment: (NOTE) SARS-CoV-2 antigen NOT DETECTED.    Negative results are presumptive.  Negative results do not preclude SARS-CoV-2 infection and should not be used as the sole basis for treatment or other patient management decisions, including infection  control decisions, particularly in the presence of clinical signs and  symptoms consistent with COVID-19, or in those who have been in contact with the virus.  Negative results must be combined with clinical observations, patient history, and epidemiological information. The expected result is Negative.   Fact Sheet for Patients: HandmadeRecipes.com.cy   Fact Sheet for Healthcare Providers: FuneralLife.at   This test is not yet approved or cleared by the [place] FDA and  has been authorized for detection and/or diagnosis of SARS-CoV-2 by FDA under an Emergency Use Authorization (EUA).  This EUA will remain in effect (meaning this test  can be used) for the duration of  the COV                           ID-19 declaration under Section 564(b)(1) of the Act, 21 U.S.C. section 360bbb-3(b)(1), unless the authorization is terminated or revoked sooner.       Cholesterol 05/02/2022 196  0 - 200 mg/dL Final   Triglycerides 05/02/2022 517 (H)  <150 mg/dL Final   HDL 05/02/2022 50  >40 mg/dL Final   Total CHOL/HDL Ratio 05/02/2022 3.9  RATIO Final   VLDL 05/02/2022 UNABLE TO CALCULATE IF TRIGLYCERIDE OVER 400 mg/dL  0 - 40 mg/dL Final   LDL Cholesterol 05/02/2022 UNABLE TO CALCULATE IF TRIGLYCERIDE OVER 400 mg/dL  0 - 99 mg/dL Final    Comment:        Total Cholesterol/HDL:CHD Risk Coronary Heart Disease Risk Table                     Men   Women  1/2 Average Risk   3.4   3.3  Average Risk       5.0   4.4  2 X Average Risk   9.6   7.1  3 X Average Risk  23.4   11.0        Use the calculated Patient Ratio above and the CHD Risk Table to determine the patient's CHD Risk.        ATP III CLASSIFICATION (LDL):  <100     mg/dL   Optimal  100-129  mg/dL   Near or Above  Optimal  130-159  mg/dL   Borderline  160-189  mg/dL   High  >190     mg/dL   Very High Performed at [facility] [address], [place], [place] [address]     TSH 05/02/2022 1.885  0.350 - 4.500 uIU/mL Final    Comment: Performed by a 3rd Generation assay with a functional sensitivity of <=0.01 uIU/mL. Performed at [facility], [place] [address] [place], [place] [address]     Direct LDL 05/02/2022 76  0 - 99 mg/dL Final    Performed at [facility] [address], [place], [place] [address]  Office Visit on 04/23/2022  Component Date Value Ref Range Status   Influenza A, POC 04/23/2022 Negative  Negative Final   Influenza B, POC 04/23/2022 Negative  Negative Final   SARS Coronavirus 2 Ag 04/23/2022 Negative  Negative Final      Blood Alcohol level:  Recent Labs       Lab Results  Component Value Date    ETH  233 (H) 05/02/2022    ETH 14 (H) 99991111        Metabolic Disorder Labs: Recent Labs       Lab Results  Component Value Date    HGBA1C 5.3 05/02/2022    MPG 105 05/02/2022    MPG 105.41 02/25/2021      Recent Labs  No results found for: "PROLACTIN"   Recent Labs       Lab Results  Component Value Date    CHOL 196 05/02/2022    TRIG 517 (H) 05/02/2022    HDL 50 05/02/2022    CHOLHDL 3.9 05/02/2022    VLDL UNABLE TO CALCULATE IF TRIGLYCERIDE OVER 400 mg/dL 05/02/2022    LDLCALC UNABLE TO CALCULATE IF TRIGLYCERIDE OVER 400 mg/dL 05/02/2022    [place] 98 02/25/2021        Therapeutic Lab Levels: Recent Labs  No results found for: "LITHIUM"   Recent Labs       Lab Results  Component Value Date    VALPROATE 50.6 10/23/2012    VALPROATE 18.6 (L) 06/30/2012      Recent Labs  No results found for: "CBMZ"     Physical Findings    AUDIT     Flowsheet Row Admission (Discharged) from 07/26/2013 in [facility] Admission (Discharged) from 02/18/2013 in [facility] Admission (Discharged) from 11/27/2012 in [facility] Admission (Discharged) from 10/24/2012 in [facility] Admission (Discharged) from 06/08/2012 in [facility] 300B  Alcohol Use Disorder Identification Test Final Score (AUDIT) 33 36 31 35 38         PHQ2-9     [place] ED from 05/02/2022 in [facility] Office Visit from 01/21/2022 in [facility] Office Visit from 09/24/2021 in [facility] Office Visit from 09/04/2021 in [facility] Office Visit from 08/01/2021 in [facility]  PHQ-2 Total Score 2 0 1 0 0  PHQ-9 Total Score 6 1 5 4 5          Flowsheet Row ED from 05/02/2022 in [facility] ED from 02/12/2022 in  [facility] ED from 06/17/2021 in [facility] No Risk No Risk No Risk  Musculoskeletal  Strength & Muscle Tone: within normal limits Gait & Station: normal Patient leans: N/A   Psychiatric Specialty Exam  Presentation  General Appearance:  Disheveled   Eye Contact: Good   Speech: Clear and Coherent; Normal Rate   Speech Volume: Normal   Handedness: Right     Mood and Affect  Mood: Euthymic   Affect: Congruent     Thought Process  Thought Processes: Linear   Descriptions of Associations:Intact   Orientation:Full (Time, Place and Person)   Thought Content:Logical  Diagnosis of Schizophrenia or Schizoaffective disorder in past: No    Hallucinations:Hallucinations: None   Ideas of Reference:None   Suicidal Thoughts:Suicidal Thoughts: No   Homicidal Thoughts:Homicidal Thoughts: No     Sensorium  Memory: Immediate Good; Recent Good; Remote Good   Judgment: Poor   Insight: Poor     Executive Functions  Concentration: Good   Attention Span: Good   Recall: Good   Fund of Knowledge: Good   Language: Good     Psychomotor Activity  Psychomotor Activity: Psychomotor Activity: Normal     Assets  Assets: Transportation; Housing     Sleep  Sleep: Sleep: Good     No data recorded   Physical Exam  Physical Exam Constitutional:      Appearance: NAD HENT:     Head: Normocephalic and atraumatic.  Pulmonary:     Effort: Pulmonary effort is normal. No respiratory distress.  Neurological:     General: No focal deficit present.     Mental Status: He is alert.  Psychiatric:        Mood and Affect: Mood normal.        Thought Content: Thought content normal.    Review of Systems  Constitutional:  Negative for chills and fever.  Respiratory:  Negative for cough and hemoptysis.   Gastrointestinal:  Negative for blood in stool and melena.   Psychiatric/Behavioral:  Positive for substance abuse. Negative for depression, hallucinations and suicidal ideas. The patient is not nervous/anxious and does not have insomnia.     Blood pressure (!) 136/96, pulse (!) 110, temperature 97.8 F (36.6 C), temperature source Oral, resp. rate 16, SpO2 97 %. There is no height or weight on file to calculate BMI.   Treatment Plan Summary:  EtOH Use Disorder Ativan taper with CIWA and as needed Ativan-taper scheduled to end on 05/06/2022 naltrexone was started on 05/03/2022-patient tolerating well  Medical: - Lab work unremarkable  On admission, UDS positive for cocaine and methamphetamines.  EtOH 233 - Elevated hemoglobin, may have sleep apnea  05/04/2022-no medication changes at this time.  Plan: Patient is requesting to be discharged on 05/05/2022.  He is requesting outpatient psychiatric resources for medication management and therapy.  He will be referred to the [facility] IOP program.  He does not need a prescription for naltrexone upon discharge, he has medication at home.   Thomes Lolling NP 05/04/2022 12:52 pm

## 2022-05-04 NOTE — ED Notes (Signed)
Patient states that he does not want to his ativan scheduled for 1600.

## 2022-05-04 NOTE — Discharge Instructions (Signed)
Discharge recommendations:   Medications: Patient is to take medications as prescribed. The patient is to contact a medical professional and/or outpatient provider to address any new side effects that develop. The patient should update outpatient providers of any new medications and/or medication changes.   Outpatient Follow up: Please review list of outpatient resources for psychiatry, counseling and substance use. Please follow up with your primary care provider for all medical related needs.   Therapy: We recommend that patient participate in individual therapy to address mental health concerns and substance use.  Safety:   The following safety precautions should be taken:   No sharp objects. This includes scissors, razors, scrapers, and putty knives.   Chemicals should be removed and locked up.   Medications should be removed and locked up.   Weapons should be removed and locked up. This includes firearms, knives and instruments that can be used to cause injury.   The patient should abstain from use of illicit substances/drugs and abuse of any medications.  If symptoms worsen or do not continue to improve or if the patient becomes actively suicidal or homicidal then it is recommended that the patient return to the closest hospital emergency department, the [facility], or call 911 for further evaluation and treatment. National Suicide Prevention Lifeline 1-800-SUICIDE or 732-475-6074.  About 988 988 offers 24/7 access to trained crisis counselors who can help people experiencing mental health-related distress. People can call or text 988 or chat 988lifeline.org for themselves or if they are worried about a loved one who may need crisis support.        CHARITABLE RESIDENTIAL REHABS  [facility] [address] (women only at this campus) [place]. [address] [address] [place], [place] [address] [phone]  [facility] [place]  (men only at this campus) [address] [place], [place] [address] [phone]  ((These programs listed above have a one-time application fee.))   [facility] [address] [place], [place], [address] Men's Program - [place] - [phone] Women's Program - Lurline Idol [phone] Intake Coordinator - [phone]  (56-month program offering Hep C treatment to anyone who reaches the 28-month mark in their program.  Free of cost.  No controlled substances allowed for medications.  No heavy anti-psychotics like the ones that treat schizophrenia or Seroquel.  Transportation for pick-up is available.)   [employer] [address], [place] [address] [phone]  [facility] is not for everyone.  They have stringent acceptance criteria.  No pending legal issues, no psychotropic medications, and zero history of SI/HI/AVH. This is a free program, but it is a high end program.)  [employer] [address], [place] [address] [phone]  [facility] [address] [place], [place], [address] [phone] phone  (This is a halfway house for WOMEN ONLY, 18+.  Residential is 12 months, and non-residential is 6-12 months depending on progress.)  [facility] [employer] [facility] [employer] [address] [place], [place], [address] [phone] phone [phone] phone  [facility] halfway house, 9-12 months)  [address] [place] [address] [place], [place] [address] [phone], [place] [address] [phone]  [facility] (Rehab for men only) [address] [place], [place] [address] [phone]   RESIDENTIAL TREATMENT- PRIVATE PAY:  [facility] - [place], [place] [phone] Women's Division - [place], [place] [phone]  MEDICAL DETOX/RESIDENTIAL  TREATMENT- MEDICAID/IPRS:  [facility] [id number] [place], [place] [address] [id number]  [facility] [address] [place], [place] [address] [phone]  [facility] [address] [place]. [place], [place] [address] [phone]  [facility] [address] [place], [place] [address] [phone]  [address] [facility] [address] [place], [place] [address] [phone]  ((For admissions to these three [facility] facilities during weekday days and possibly other times, contact [place], phone: [phone]; fax: [phone]))  [facility] [address] [place], [place], [address] [phone] phone  [facility] [address] [place]  [place] [address] [place], [place], [address]  [place], [place], [address] [phone] phone [phone] fax  ([address], [place], [place], [place] Medicare and Medicaid, UHC, Uninsured/IPRS)  [facility] (detox for men and women) [address] [place], [place] [address] [phone]  MEDICAL DETOX AND RESIDENTIAL TREATMENT - INSURANCE:  [facility] (also offers CD-IOP for graduates of residential program) [address] [place], [place] [address] [phone]  [facility] (now accepting [place]) [address] [place], [place] [address] [phone]  [facility] (accept [facility] for some services) [address] [place], [place] [address] [phone]  OUTPATIENT PROGRAMS:  [facility] ([facility]) [address], [place] [address] [phone]  ((CD-IOP is currently not operational; Opioid replacement clinic is operational))   [facility] (private insurance) [address] [employer] [address] [place], [place] [address] [phone]  ((CD-IOP (afternoon program); individual therapy))   [facility] (Medicaid & IPRS for [facility] residents) [address] [place], [place] [address] [phone]  ((CD-IOP; new clients must go through walk-in clinic))   [facility] and safety inspector (Medicaid & IPRS for [facility] residents) [address] [place], [place] [address] [phone]  ((CD-IOP))   [facility] (Medicaid for [employer] and [employer]; some private insurance) [address] [place], [place] [address] [phone]  ((CD-IOP))   [facility] & private insurance) [phone] [address] [place], [place] [address] [phone]  ((CD-IOP (morning & evening programs); individual therapy))   HALFWAY HOUSES:  [facility] [phone]  [email or web address]   OPIOID REPLACEMENT PROVIDERS:  [facility] ([facility]) [address], [place] [address] [phone]  [facility] [address] [place], [place] [address] [phone]  [facility] [address] [place], [place] [address] [phone]   12 STEP PROGRAMS:  Alcoholics Anonymous of [place] [employer]  Narcotics Anonymous of [place] [employer]  Al-Anon of [place], [place] www.[place]alanon.org/find-meetings.html  Nar-Anon https://nar-anon.org/find-a-meetin

## 2022-05-04 NOTE — ED Notes (Signed)
Patient in room. Environment is secured. Will continue to monitor for safety. 

## 2022-05-04 NOTE — ED Notes (Signed)
Pt a/o . Denies SI/HI/AVH. Denies s/s of withdrawal. No noted distress. Will continue to monitor for safety

## 2022-05-04 NOTE — ED Notes (Signed)
Patient alert and oriented x 3. Denies SI/HI/AVH. Denies intent or plan to harm self or others. Routine conducted according to faculty protocol. Encourage patient to notify staff with any needs or concerns. Patient verbalized agreement and understanding. Will continue to monitor for safety. 

## 2022-05-04 NOTE — ED Notes (Signed)
Pt was given lunch.

## 2022-05-04 NOTE — ED Notes (Signed)
Patient is watching tv in the dayroom. Environment secured. Alert and oriented. Will continue to monitor for safety. 

## 2022-05-04 NOTE — ED Notes (Signed)
Pt was given dinner 

## 2022-05-04 NOTE — Group Note (Signed)
Group Topic: Communication  Group Date: 05/04/2022 Start Time: [place] End Time: [place] Facilitators: Sherrlyn Hock, RN  Department: [facility]  Number of Participants: 6  Group Focus: safety plan Treatment Modality:  Behavior Modification Therapy Interventions utilized were assignment Purpose: enhance coping skills  Name: Blake Arnold Date of Birth: [birth year]  MR: [id number]    Level of Participation: active Quality of Participation: cooperative Interactions with others: gave feedback Mood/Affect: appropriate Triggers (if applicable):  Cognition: coherent/clear Progress: Significant Response:  Plan: patient will be encouraged to use plan if feeling suicidal  Patients Problems:  Patient Active Problem List   Diagnosis Date Noted   Gastroenteritis 04/23/2022   AVN (avascular necrosis of bone) ([place]) 08/02/2021   History of hepatitis C 04/12/2021   Alcohol use disorder, severe, dependence ([place]) 04/12/2021   Chronic pain 11/09/2020   Tooth abscess 01/14/2020   Tongue lesion 10/11/2019   Carpal tunnel syndrome 08/24/2019   Onychomycosis 08/24/2019   Cervicalgia 06/02/2019   Left hip pain 04/06/2019   Abdominal pain 02/28/2019   Shoulder pain 12/17/2018   Hyperlipidemia 09/01/2018   HTN (hypertension) 07/24/2017   Seizure ([place]) 07/24/2017   RA (rheumatoid arthritis) ([place]) 07/24/2017   Numbness in feet 03/04/2016   Degenerative joint disease 12/28/2015   OSA (obstructive sleep apnea) 11/23/2015   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

## 2022-05-04 NOTE — ED Notes (Signed)
Pt is sleeping. No distress noted. Will continue to monitor for safety. 

## 2022-05-04 NOTE — ED Notes (Signed)
Pt was given breakfast

## 2022-05-05 DIAGNOSIS — F1490 Cocaine use, unspecified, uncomplicated: Secondary | ICD-10-CM | POA: Diagnosis not present

## 2022-05-05 DIAGNOSIS — F1020 Alcohol dependence, uncomplicated: Secondary | ICD-10-CM | POA: Diagnosis not present

## 2022-05-05 DIAGNOSIS — I10 Essential (primary) hypertension: Secondary | ICD-10-CM | POA: Diagnosis not present

## 2022-05-05 DIAGNOSIS — I4891 Unspecified atrial fibrillation: Secondary | ICD-10-CM | POA: Diagnosis not present

## 2022-05-05 MED ORDER — AMLODIPINE BESYLATE 10 MG PO TABS: 10.0000 mg | ORAL_TABLET | Freq: Every day | ORAL | Status: DC

## 2022-05-05 MED ORDER — NALTREXONE HCL 50 MG PO TABS: 50.0000 mg | ORAL_TABLET | Freq: Every day | ORAL | Status: DC

## 2022-05-05 MED ORDER — GABAPENTIN 300 MG PO CAPS: 300.0000 mg | ORAL_CAPSULE | Freq: Three times a day (TID) | ORAL | Status: DC

## 2022-05-05 MED ORDER — METOPROLOL TARTRATE 25 MG PO TABS: 25.0000 mg | ORAL_TABLET | Freq: Two times a day (BID) | ORAL | Status: DC

## 2022-05-05 NOTE — ED Notes (Signed)
Pt sleeping in bed. RR even and unlabored. No noted distress. Will continue to monitor for safety 

## 2022-05-05 NOTE — ED Provider Notes (Signed)
FBC/OBS ASAP Discharge Summary  Date and Time: 05/05/2022 10:16 AM  Name: Blake Arnold  MRN:  [id number]   Discharge Diagnoses:  Final diagnoses:  Alcohol use disorder, severe, dependence ([place])  Cocaine use    Subjective: Patient seen and evaluated face-to-face by this provider, chart reviewed and case discussed with Dr. Alroy Dust. On evaluation, patient is alert and oriented x 4. His thought process is logical and goal oriented. His speech is clear and coherent.  His mood is euthymic and affect is congruent. He has fair eye contact. He denies SI/HI/AVH. There is no objective evidence that the patient is currently responding to internal or external stimuli. He is calm and cooperative and does not appear to be in acute distress. Today, patient is requesting to discharge back home so that he can return back to work tomorrow. 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 He expresses that the job is stressful and that he is considering working for [employer].  He resides with his stepfather and great nephew. He denies access to firearms in the home. He identifies his stepfather, sister Donella Stade, and father Leyden Aronoff as his support system. He states that he may follow-up with [facility] for additional outpatient treatment to maintain his sobriety. He continues to express interest in the chemical dependency intensive outpatient program. He verbalizes readiness for discharge. He gives verbal consent for his provider to safety plan with his father Kalle Donais [phone] prior to discharge. Jaimin Plitt denies any safety concerns with the patient discharging and returning home today. He confirms that the patient does not have access to firearms in the home. I discussed with Karolee Ohs as safety precautions, the patient should not have access to firearms in the home and if his symptoms mental health symptoms worsen to take the patient to the [facility], or the nearest emergency department or call 911 for an evaluation. He verbalizes understanding. He states that the patient's sister Crystal will pick the patient up from the facility today at the time of discharge.  Stay Summary: Blake Arnold is a 52 year old male patient who was admitted to the [facility] unit on 05/02/2022 for polysubstance abuse, alcohol use disorder and alcohol detox. His UDS on arrival was positive for cocaine and methamphetamine. BAL on arrival was 233. Patient was started on an Ativan detox taper to treat withdrawal symptoms. He tolerated the medications without any side effects or significant withdrawal symptoms. He was restarted on his home medications which included Norvasc 10 mg p.o. daily, gabapentin 300 mg p.o. 3 times daily, hydrochlorothiazide 25 mg p.o. daily, metoprolol 25 mg p.o. twice daily, and naltrexone 50 mg p.o. daily. Patient was referred to the chemical dependence intensive outpatient program.  Total Time spent with patient: 30 minutes  Past Psychiatric History: MDD, anxiety, EtOH disorder, crack cocaine use disorder, tobacco use disorder  Past Medical History: afib, RA, HLD, OSA, HCV  Family History: No history reported.   Family Psychiatric History: No history reported.   Social History: Lives with step dad and nephew. Relationships: fiance lives in [place]. Patient is planning on saving money to bring her to the [place]. It is unclear if they have ever met in person. Otherwise never married. Employment: works as a Secondary school teacher for [employer], reports work is stressful and he is planning on quitting soon. -Drug use: EtOH: Drinks a fifth of liquor a day,  Cocaine: Uses 1g/day  of crack, and methamphetamine: "accidentally" used methamphetamines the day before                               Tobacco Cessation:  N/A, patient does not currently use tobacco products  Current Medications:  Current  Facility-Administered Medications  Medication Dose Route Frequency Provider Last Rate Last Admin   acetaminophen (TYLENOL) tablet 650 mg  650 mg Oral Q6H PRN Scot Jun, NP       amLODipine (NORVASC) tablet 10 mg  10 mg Oral Daily Scot Jun, NP   10 mg at 05/05/22 0934   gabapentin (NEURONTIN) capsule 300 mg  300 mg Oral TID Scot Jun, NP   300 mg at 05/05/22 [id number]   hydrochlorothiazide (HYDRODIURIL) tablet 25 mg  25 mg Oral Daily Scot Jun, NP   25 mg at 05/05/22 0935   hydrOXYzine (ATARAX) tablet 25 mg  25 mg Oral Q6H PRN Corky Sox, MD       loperamide (IMODIUM) capsule 2-4 mg  2-4 mg Oral PRN Corky Sox, MD       LORazepam (ATIVAN) tablet 1 mg  1 mg Oral Q6H PRN Corky Sox, MD       LORazepam (ATIVAN) tablet 1 mg  1 mg Oral BID Corky Sox, MD       Followed by   Derrill Memo ON 05/06/2022] LORazepam (ATIVAN) tablet 1 mg  1 mg Oral Daily Corky Sox, MD       magnesium hydroxide (MILK OF MAGNESIA) suspension 30 mL  30 mL Oral Daily PRN Scot Jun, NP       metoprolol tartrate (LOPRESSOR) tablet 25 mg  25 mg Oral BID Scot Jun, NP   25 mg at 05/05/22 [id number]   multivitamin with minerals tablet 1 tablet  1 tablet Oral Daily Corky Sox, MD   1 tablet at 05/05/22 0934   naltrexone (DEPADE) tablet 50 mg  50 mg Oral Daily Scot Jun, NP   50 mg at 05/05/22 0935   ondansetron (ZOFRAN-ODT) disintegrating tablet 4 mg  4 mg Oral Q6H PRN Corky Sox, MD       thiamine (VITAMIN B1) tablet 100 mg  100 mg Oral Daily Scot Jun, NP   100 mg at 05/05/22 0935   traZODone (DESYREL) tablet 50 mg  50 mg Oral QHS PRN Scot Jun, NP   50 mg at 05/02/22 2127   Current Outpatient Medications  Medication Sig Dispense Refill   albuterol (VENTOLIN HFA) 108 (90 Base) MCG/ACT inhaler Inhale 1-2 puffs into the lungs every 6 (six) hours as needed for wheezing or shortness of breath. 18 g 0   diclofenac Sodium (VOLTAREN) 1 %  GEL Apply 1 application topically 3 (three) times daily. (Patient taking differently: Apply 2 g topically 3 (three) times daily as needed (for pain- affected sites).) 2 g 3   loratadine (CLARITIN) 10 MG tablet Take 10 mg by mouth daily as needed for rhinitis or allergies.     [START ON 05/06/2022] amLODipine (NORVASC) 10 MG tablet Take 1 tablet (10 mg total) by mouth daily.     gabapentin (NEURONTIN) 300 MG capsule Take 1 capsule (300 mg total) by mouth 3 (three) times daily.     metoprolol tartrate (LOPRESSOR) 25 MG tablet Take 1 tablet (25 mg total) by mouth 2 (two) times daily.     [START ON 05/06/2022] naltrexone (DEPADE) 50 MG tablet Take  1 tablet (50 mg total) by mouth daily.      PTA Medications:  Facility Ordered Medications  Medication   acetaminophen (TYLENOL) tablet 650 mg   magnesium hydroxide (MILK OF MAGNESIA) suspension 30 mL   traZODone (DESYREL) tablet 50 mg   [COMPLETED] thiamine (VITAMIN B1) injection 100 mg   thiamine (VITAMIN B1) tablet 100 mg   metoprolol tartrate (LOPRESSOR) tablet 25 mg   hydrochlorothiazide (HYDRODIURIL) tablet 25 mg   amLODipine (NORVASC) tablet 10 mg   gabapentin (NEURONTIN) capsule 300 mg   naltrexone (DEPADE) tablet 50 mg   multivitamin with minerals tablet 1 tablet   LORazepam (ATIVAN) tablet 1 mg   hydrOXYzine (ATARAX) tablet 25 mg   loperamide (IMODIUM) capsule 2-4 mg   ondansetron (ZOFRAN-ODT) disintegrating tablet 4 mg   [COMPLETED] LORazepam (ATIVAN) tablet 1 mg   Followed by   [EXPIRED] LORazepam (ATIVAN) tablet 1 mg   Followed by   LORazepam (ATIVAN) tablet 1 mg   Followed by   Derrill Memo ON 05/06/2022] LORazepam (ATIVAN) tablet 1 mg   PTA Medications  Medication Sig   diclofenac Sodium (VOLTAREN) 1 % GEL Apply 1 application topically 3 (three) times daily. (Patient taking differently: Apply 2 g topically 3 (three) times daily as needed (for pain- affected sites).)   loratadine (CLARITIN) 10 MG tablet Take 10 mg by mouth daily as  needed for rhinitis or allergies.   albuterol (VENTOLIN HFA) 108 (90 Base) MCG/ACT inhaler Inhale 1-2 puffs into the lungs every 6 (six) hours as needed for wheezing or shortness of breath.   [START ON 05/06/2022] amLODipine (NORVASC) 10 MG tablet Take 1 tablet (10 mg total) by mouth daily.   gabapentin (NEURONTIN) 300 MG capsule Take 1 capsule (300 mg total) by mouth 3 (three) times daily.   metoprolol tartrate (LOPRESSOR) 25 MG tablet Take 1 tablet (25 mg total) by mouth 2 (two) times daily.   [START ON 05/06/2022] naltrexone (DEPADE) 50 MG tablet Take 1 tablet (50 mg total) by mouth daily.       05/02/2022   10:53 AM 01/21/2022    3:03 PM 09/24/2021    1:29 PM  Depression screen PHQ 2/9  Decreased Interest 1 0 1  Down, Depressed, Hopeless 1 0 0  PHQ - 2 Score 2 0 1  Altered sleeping 1 0 2  Tired, decreased energy 1 1 2   Change in appetite 1 0 0  Feeling bad or failure about yourself  0 0 0  Trouble concentrating 1 0 0  Moving slowly or fidgety/restless 0 0 0  Suicidal thoughts 0 0 0  PHQ-9 Score 6 1 5   Difficult doing work/chores Very difficult Not difficult at all Somewhat difficult    Flowsheet Row ED from 05/02/2022 in [facility] ED from 02/12/2022 [facility] ED from 06/17/2021 in [facility] No Risk No Risk No Risk       Musculoskeletal  Strength & Muscle Tone: within normal limits Gait & Station: normal Patient leans: N/A  Psychiatric Specialty Exam  Presentation  General Appearance:  Appropriate for Environment  Eye Contact: Fair  Speech: Clear and Coherent  Speech Volume: Normal  Handedness: Right   Mood and Affect  Mood: Euthymic  Affect: Congruent   Thought Process  Thought Processes: Coherent; Goal Directed  Descriptions of Associations:Intact  Orientation:Full (Time, Place and Person)  Thought Content:Logical  Diagnosis of  Schizophrenia or Schizoaffective disorder  in past: No    Hallucinations:Hallucinations: None  Ideas of Reference:None  Suicidal Thoughts:Suicidal Thoughts: No  Homicidal Thoughts:Homicidal Thoughts: No   Sensorium  Memory: Immediate Fair; Recent Fair; Remote Fair  Judgment: Fair  Insight: Fair   Materials engineer: Fair  Attention Span: Fair  Recall: [employer] of Knowledge: Fair  Language: Fair   Psychomotor Activity  Psychomotor Activity: Psychomotor Activity: Normal   Assets  Assets: Communication Skills; Desire for Improvement; Financial Resources/Insurance; Housing; Leisure Time; Physical Health; Resilience; Social Support; Talents/Skills; Vocational/Educational   Sleep  Sleep: Sleep: Fair Number of Hours of Sleep: 9   No data recorded  Physical Exam  Physical Exam HENT:     Head: Normocephalic.     Nose: Nose normal.  Eyes:     Conjunctiva/sclera: Conjunctivae normal.  Cardiovascular:     Rate and Rhythm: Normal rate.  Pulmonary:     Effort: Pulmonary effort is normal.  Musculoskeletal:        General: Normal range of motion.     Cervical back: Normal range of motion.  Neurological:     Mental Status: He is alert and oriented to person, place, and time.    Review of Systems  Constitutional: Negative.   HENT: Negative.    Eyes: Negative.   Respiratory: Negative.    Cardiovascular: Negative.   Gastrointestinal: Negative.   Genitourinary: Negative.   Musculoskeletal: Negative.   Neurological: Negative.   Endo/Heme/Allergies: Negative.    Blood pressure (!) 134/93, pulse 87, temperature 97.7 F (36.5 C), temperature source Oral, resp. rate 17, SpO2 96 %. There is no height or weight on file to calculate BMI.  Demographic Factors:  Male and Caucasian  Loss Factors: NA  Historical Factors: NA  Risk Reduction Factors:   Sense of responsibility to family, Religious beliefs about death, Employed, Living  with another person, especially a relative, and Positive social support  Continued Clinical Symptoms:  Alcohol/Substance Abuse/Dependencies  Cognitive Features That Contribute To Risk:  None    Suicide Risk:  Minimal: No identifiable suicidal ideation.  Patients presenting with no risk factors but with morbid ruminations; may be classified as minimal risk based on the severity of the depressive symptoms  Plan Of Care/Follow-up recommendations:  Medications: Patient is to take medications as prescribed. The patient is to contact a medical professional and/or outpatient provider to address any new side effects that develop. The patient should update outpatient providers of any new medications and/or medication changes. Patient declined prescriptions at that the time of discharge. He states that he has medications at home and follows up with [facility] for medication management.   Outpatient Follow up: Please review list of outpatient resources for psychiatry, counseling and substance use. Please follow up with your primary care provider for all medical related needs.   Therapy: We recommend that patient participate in individual therapy to address mental health concerns and substance use.  Safety:   The following safety precautions should be taken:   No sharp objects. This includes scissors, razors, scrapers, and putty knives.   Chemicals should be removed and locked up.   Medications should be removed and locked up.   Weapons should be removed and locked up. This includes firearms, knives and instruments that can be used to cause injury.   The patient should abstain from use of illicit substances/drugs and abuse of any medications.  If symptoms worsen or do not continue to improve or if the patient becomes actively suicidal or  homicidal then it is recommended that the patient return to the closest hospital emergency department, the [facility], or  call 911 for further evaluation and treatment. National Suicide Prevention Lifeline 1-800-SUICIDE or (321) 444-6192.  About 988 988 offers 24/7 access to trained crisis counselors who can help people experiencing mental health-related distress. People can call or text 988 or chat 988lifeline.org for themselves or if they are worried about a loved one who may need crisis support.   CHARITABLE RESIDENTIAL REHABS  [facility] (women only at this campus) [place], [place] [address] [phone]  [facility] [place] (men only at this campus) [place], [place] [address] [phone]  ((These programs listed above have a one-time application fee.))  [facility] [address]. [place], [place], [address] Men's Program - Roderic Ovens [place] - [id number] Women's Program - Delma Freeze [phone] Intake Coordinator - [phone]  (26-month program offering Hep C treatment to anyone who reaches the 75-month mark in their program.  Free of cost.  No controlled substances allowed for medications.  No heavy anti-psychotics like the ones that treat schizophrenia or Seroquel.  Transportation for pick-up is available.)  [facility] [address], [place] [address] [phone]  ([place] is not for everyone.  They have stringent acceptance criteria.  No pending legal issues, no psychotropic medications, and zero history of SI/HI/AVH. This is a free program, but it is a high end program.)  [place] [address], [place] [address] [phone]  [place]. [place], [place], [address] [phone] phone  (This is a halfway house for [place], 18+.  Residential is 12 months, and non-residential is 6-12 months depending on progress.)  [facility] [employer] [address] [place], [place], [address] [phone]  phone [phone] phone  [facility] halfway house, 9-12 months)  [place], [place] [address] [phone], [place] [address] [phone]  [facility] (Rehab for men only) [address] [place], [place] [address] [phone]   [place]:  [facility] [place], [place] [phone] Women's [place], [place] [phone]  MEDICAL DETOX/RESIDENTIAL TREATMENT- MEDICAID/IPRS:  [id number] [place], [place] [address] [phone]  [place] [address] [place], [place] [address] [phone]  [place]. [employer], [place] [address] [phone]  [place] [address] [place], [place] [address] [phone]  [place], [place] [address] [phone]  ((For admissions to these three [facility] facilities during weekday days and possibly other times, contact [place], phone: [phone]; fax: [phone]))  [facility] [place] ([place]) [address] [place], [place], [address] [phone] phone  [place] [facility] [place] [address] [place], [place], [address]  [place], [place], [address] [phone] phone [phone] fax  ([address] [place], [place], Most Medicare and Medicaid, [place], Uninsured/IPRS)  [facility] (detox for men and women) [place], [place] [address] [phone]  MEDICAL DETOX AND RESIDENTIAL TREATMENT - INSURANCE:  [facility] (also offers CD-IOP for graduates of residential program) [place]. [place],  [address] [phone]  [facility] (now accepting [place]) [place] [address] [phone]  [place] (accept [employer] for some services) [address] [place], [place] [address] [phone]  OUTPATIENT PROGRAMS:  [facility] (ADS) [place], [place] [address] [phone]  ((CD-IOP is currently not operational; Opioid replacement clinic is operational))   [facility] (private insurance) [place]. [employer]. [address] [place], [place] [address] [phone]  ((CD-IOP (afternoon program); individual therapy))   [place] (Medicaid & IPRS for [facility] residents) [place], [place] [address] [phone]  ((CD-IOP; new clients must go through walk-in clinic))   [place] (Medicaid & IPRS for [facility] residents) [place]. [place], [place] [address] [phone]  ((CD-IOP))   [facility] (Medicaid for [facility] and [employer]; some private insurance) [address] [place], [place] [address] [phone]  ((CD-IOP))   [facility] ([place] insurance) [phone] [address]. [place], [place] [address] [phone]  ((CD-IOP (morning & evening programs); individual therapy))   HALFWAY HOUSES:  [facility] [phone]  [employer].[email or web address]   OPIOID REPLACEMENT PROVIDERS:  [facility] (ADS) [place], [place] [address] [phone]  [place] [address] [place], [place] [address] [phone]  [facility] [address] [place], [place] [address] [phone]   12 STEP PROGRAMS:  Alcoholics Anonymous of [place] ReportZoo.com.cy  Narcotics Anonymous of [place] GreenScrapbooking.dk  Al-Anon of [place], [place] [email or web address]/find-meetings.html  Nar-Anon https://nar-anon.org/find-a-meetin   Disposition: Discharge home.   Talicia Sui L, NP 05/05/2022, 10:16 AM

## 2022-05-05 NOTE — ED Notes (Signed)
Patient awake and alert on unit.  He is calm and pleasant on approach.  Patient without complaint or withdrawal symptoms.  He has requested discharge this morning and provider made aware.  Denies avh shi or plan.  Will continue to monitor.

## 2022-05-05 NOTE — ED Notes (Signed)
Pt sleeping in room. RR even and unlabored. No noted distress. Will monitor for safety

## 2022-05-07 ENCOUNTER — Ambulatory Visit (INDEPENDENT_AMBULATORY_CARE_PROVIDER_SITE_OTHER): Payer: Medicaid Other | Admitting: Student

## 2022-05-07 VITALS — BP 120/74 | HR 73 | Wt 240.0 lb

## 2022-05-07 DIAGNOSIS — R1011 Right upper quadrant pain: Secondary | ICD-10-CM

## 2022-05-07 MED ORDER — AMLODIPINE BESYLATE 10 MG PO TABS: 10.0000 mg | ORAL_TABLET | Freq: Every day | ORAL | 3 refills | Status: DC

## 2022-05-07 MED ORDER — ONDANSETRON HCL 4 MG PO TABS: 4.0000 mg | ORAL_TABLET | Freq: Three times a day (TID) | ORAL | 0 refills | Status: DC | PRN

## 2022-05-07 MED ORDER — ALBUTEROL SULFATE HFA 108 (90 BASE) MCG/ACT IN AERS: 1.0000 | INHALATION_SPRAY | Freq: Four times a day (QID) | RESPIRATORY_TRACT | 0 refills | Status: DC | PRN

## 2022-05-07 NOTE — Assessment & Plan Note (Addendum)
Differential includes choledocholithiasis/cholelithiasis, diverticulitis. peptic ulcer disease, acute cholecystitis Clinical suspicion is highest for gallbladder pathology given chronicity of symptoms and pain with palpation of right upper quadrant. He is generally well-appearing and abdominal exam is overall benign, therefore not concern for an urgent pathology such as acute cholecystitis. I reviewed his prior colonoscopy in 2021 which did show moderate diverticula, but his signs and symptoms are not consistent with acute diverticulitis flare, although this was considered. Can also consider alcohol related liver disease, although he does not have any jaundice or other systemic signs of liver disease. -Scheduled right upper quadrant ultrasound for this week -Zofran 4 mg every 8 hours as needed for nausea and vomiting -Advised plenty of oral fluid hydration -Advised to avoid fatty/spicy foods -Advised to avoid NSAID use -Can consider GI referral if symptoms persist -Return precautions discussed

## 2022-05-07 NOTE — Progress Notes (Signed)
    SUBJECTIVE:   CHIEF COMPLAINT / HPI:   Blake Arnold is a 52 year old male with history of polysubstance use (recent detox) here for abdominal pain now ongoing for 2 to 3 weeks.  He says that the pain is little bit worse with food.   He has not had a change in appetite and has been able to tolerate meals, but does have the pain and nausea afterward. This morning, he ate bacon, eggs and grits but felt unwell afterward.  Last bowel movement was this morning, was of normal consistency.  No vomiting or diarrhea.  Of note, on 05/02/2022 he presented to be hypoxic for detox from alcohol, cocaine and meth. He says that he has been sober since then and is taking his naltrexone for his cravings, which works well.  He is also taking gabapentin 300 mg 3 times daily.  He tells me he was sober for 6 years prior to his most recent "downfall" with his drinking binge. Every night, he still night sweats and chills.   Denies any interest in AA, and says that he "does not believe in it."  He says that "I have just got a leave it alone and that 1 drink leads to many more."  PERTINENT  PMH / PSH: MDD, anxiety, EtOH disorder, crack cocaine use disorder, tobacco use disorder   OBJECTIVE:   BP 120/74   Pulse 73   Wt 240 lb (108.9 kg)   SpO2 97%   BMI 36.49 kg/m   General: Well-appearing, pleasant, no distress CV: Regular rate and rhythm Respiratory: Normal work of breathing on room air.  No wheezing or crackles. Abdomen: Inspection is normal.  Obese, soft, tender to palpation in the right upper quadrant with moderate to deep pressure.  Bowel sounds normal and active in all 4 quadrants. Extremities: Warm and well-perfused.   ASSESSMENT/PLAN:   Abdominal pain Differential includes choledocholithiasis/cholelithiasis, diverticulitis. peptic ulcer disease, acute cholecystitis Clinical suspicion is highest for gallbladder pathology given chronicity of symptoms and pain with palpation of right upper  quadrant. He is generally well-appearing and abdominal exam is overall benign, therefore not concern for an urgent pathology such as acute cholecystitis. I reviewed his prior colonoscopy in 2021 which did show moderate diverticula, but his signs and symptoms are not consistent with acute diverticulitis flare, although this was considered. Can also consider alcohol related liver disease, although he does not have any jaundice or other systemic signs of liver disease. -Scheduled right upper quadrant ultrasound for this week -Zofran 4 mg every 8 hours as needed for nausea and vomiting -Advised plenty of oral fluid hydration -Advised to avoid fatty/spicy foods -Advised to avoid NSAID use -Can consider GI referral if symptoms persist -Return precautions discussed  Of note, his most recent blood work with CBC did show polycythemia to 18.1 and recommend follow-up with PCP for further evaluation (although this is likely secondary to his tobacco use)   Orvis Brill, [place]

## 2022-05-07 NOTE — Patient Instructions (Addendum)
Great meeting you today, Mr. Huseby.  -Continue to abstain from alcohol and other substances.  -Please go get your abdominal ultrasound completed on  05/09/2022 at 11:10 at the address listed below. DO NOT EAT OR DRINK anything after midnight the day before your ultrasound (Wednesday 3/27)  [facility] [address] [place], [place] [address] [phone]  We will call you with results.  -In the meantime, avoid overly fatty/spicy foods as this can irritate your stomach. You may take Zofran (anti-nausea medicine) as needed every 8 hours for nausea/vomiting. I sent this to your pharmacy.  Please come back for a follow-up visit in 2-4 weeks with Dr. Nita Sells, your primary care doctor.  Best wishes, Dr. Owens Shark

## 2022-05-09 ENCOUNTER — Ambulatory Visit
Admission: RE | Admit: 2022-05-09 | Discharge: 2022-05-09 | Disposition: A | Discharge status: Home / Self Care | Payer: Medicaid Other | Source: Ambulatory Visit | Attending: Family Medicine | Admitting: Family Medicine

## 2022-05-09 DIAGNOSIS — R1011 Right upper quadrant pain: Secondary | ICD-10-CM

## 2022-05-12 DIAGNOSIS — M542 Cervicalgia: Principal | ICD-10-CM

## 2022-05-12 DIAGNOSIS — G8929 Other chronic pain: Principal | ICD-10-CM

## 2022-05-14 ENCOUNTER — Encounter: Payer: Self-pay | Admitting: Student

## 2022-05-16 ENCOUNTER — Ambulatory Visit (INDEPENDENT_AMBULATORY_CARE_PROVIDER_SITE_OTHER): Payer: Medicaid Other | Admitting: Physician Assistant

## 2022-05-16 ENCOUNTER — Encounter: Payer: Self-pay | Admitting: Physician Assistant

## 2022-05-16 DIAGNOSIS — M25511 Pain in right shoulder: Secondary | ICD-10-CM

## 2022-05-16 DIAGNOSIS — M19011 Primary osteoarthritis, right shoulder: Secondary | ICD-10-CM | POA: Diagnosis not present

## 2022-05-16 DIAGNOSIS — G8929 Other chronic pain: Secondary | ICD-10-CM

## 2022-05-16 NOTE — Progress Notes (Signed)
HPI: [name] returns today for follow-up of his right shoulder.  He underwent and intra articular injection of the right shoulder with Dr. Rolena Infante on 04/15/2022.  He states that the injection for about a week and a half took away almost all the pain.  However his pain now is became worse.  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  He is right-hand dominant.  In his shoulder pain began last October when he injured himself running into a door jam.  He is having no radicular symptoms down the arm.  He notes pain with overhead motion of the shoulder.  Given MRI 03/24/2022 it did show mild to moderate glenohumeral arthritis.  Posterior superior labral tear and multiloculated 10 mm posterior superior paralabral cyst.  Moderate arthritis of his AC joint.  Review of systems: See HPI otherwise negative  Physical exam: General well-developed well-nourished male no acute distress mood and affect appropriate. Bilateral shoulders: 5 out of 5 strength with external and internal rotation against resistance.  Empty can test is negative bilaterally.  Liftoff test negative on the left equivocal on the right secondary to pain.  Right shoulder is full overhead activity.  This begins having pain and about 90 degrees of forward flexion.  Abduction to has no pain on the left.  Slight pain with abduction of the right arm across the chest.  He has tenderness to the bicipital groove and over the right AC joint.  Impression: Chronic right shoulder pain  Right shoulder osteoarthritis  Plan: Discussed patient with Dr. Marlou Sa went over the MRI with him.  Will have him follow-up with Dr. Marlou Sa on 05/27/2022 at 1330 hrs. did discuss with the patient that after talking to Dr. Marlou Sa, it was felt that he could either benefit from 1 of 2 procedures.  1 being a right shoulder arthroplasty the other right shoulder arthroscopy with debridement, biceps tenodesis and possible distal clavicle resection.   Patient states he like to stay as conservative as possible.  Questions were encouraged and answered at length.

## 2022-05-17 ENCOUNTER — Encounter: Payer: Self-pay | Admitting: Gastroenterology

## 2022-05-27 ENCOUNTER — Encounter: Payer: Self-pay | Admitting: Orthopedic Surgery

## 2022-05-27 ENCOUNTER — Other Ambulatory Visit: Payer: Self-pay

## 2022-05-27 ENCOUNTER — Ambulatory Visit: Payer: Medicaid Other | Admitting: Orthopedic Surgery

## 2022-05-27 DIAGNOSIS — G8929 Other chronic pain: Secondary | ICD-10-CM

## 2022-05-27 DIAGNOSIS — M19011 Primary osteoarthritis, right shoulder: Secondary | ICD-10-CM

## 2022-05-27 DIAGNOSIS — M25511 Pain in right shoulder: Secondary | ICD-10-CM | POA: Diagnosis not present

## 2022-05-27 MED ORDER — LIDOCAINE HCL 1 % IJ SOLN
5.0000 mL | INTRAMUSCULAR | Status: AC | PRN
  Administered 2022-05-27: 5 mL

## 2022-05-27 MED ORDER — BUPIVACAINE HCL 0.5 % IJ SOLN
9.0000 mL | INTRAMUSCULAR | Status: AC | PRN
  Administered 2022-05-27: 9 mL via INTRA_ARTICULAR

## 2022-05-27 MED ORDER — BUPIVACAINE HCL 0.25 % IJ SOLN
0.6600 mL | INTRAMUSCULAR | Status: AC | PRN
  Administered 2022-05-27: .66 mL via INTRA_ARTICULAR

## 2022-05-27 MED ORDER — METHYLPREDNISOLONE ACETATE 40 MG/ML IJ SUSP
13.3300 mg | INTRAMUSCULAR | Status: AC | PRN
  Administered 2022-05-27: 13.33 mg via INTRA_ARTICULAR

## 2022-05-27 MED ORDER — LIDOCAINE HCL 1 % IJ SOLN
3.0000 mL | INTRAMUSCULAR | Status: AC | PRN
  Administered 2022-05-27: 3 mL

## 2022-05-27 MED ORDER — METHYLPREDNISOLONE ACETATE 40 MG/ML IJ SUSP
40.0000 mg | INTRAMUSCULAR | Status: AC | PRN
  Administered 2022-05-27: 40 mg via INTRA_ARTICULAR

## 2022-05-27 NOTE — Progress Notes (Signed)
Office Visit Note   Patient: Blake Arnold           Date of Birth: [birth year]           MRN: [id number] Visit Date: 05/27/2022 Requested by: Sabino Dick, DO [address] [place],  [place] [address] PCP: Sabino Dick, DO  Subjective: Chief Complaint  Patient presents with   Right Shoulder - Pain    HPI: Blake Arnold is a 52 y.o. male who presents to the office reporting right shoulder pain.  Has had pain for 2 years.  He is right-hand dominant.  Pain wakes him from sleep at night.  Radiates to the wrist.  Denies any numbness and tingling but does describe neck pain.  Reports decreased range of motion as well as difficulty with ADLs.  Has had prior injection with Dr. Shon Baton which helped about 1 to 2 weeks.  That was performed about 6 weeks ago.  Pain started when he injured his right shoulder in a door jam.  He just resigned from his job because of the shoulder issue.  He is on disability.  Pain is primarily in the front and back of the shoulder and also superiorly.  He is having uses left hand for many things.  He does have a history of rheumatoid arthritis but does not take any medication for that.  Last injection was a subacromial injection..                ROS: All systems reviewed are negative as they relate to the chief complaint within the history of present illness.  Patient denies fevers or chills.  Assessment & Plan: Visit Diagnoses: No diagnosis found.  Plan: Impression is right shoulder glenohumeral joint arthritis with intact rotator cuff but also with AC joint arthritis and possible biceps tendinitis.  MRI scan confirms intact rotator cuff as well as significant edema in the [facility] joint particular on the clavicular side.  Patient wants to hold off on any type of shoulder replacement which I think is understandable.  He is going to be going back on his rheumatoid arthritis medication shortly.  Plan at this time today is ultrasound-guided [facility] joint injection first  followed by ultrasound-guided glenohumeral joint injection in 6-week return for follow-up and decision for or against any further surgical intervention.  Follow-Up Instructions: No follow-ups on file.   Orders:  No orders of the defined types were placed in this encounter.  No orders of the defined types were placed in this encounter.     Procedures: Large Joint Inj: R glenohumeral on 05/27/2022 10:07 PM Indications: diagnostic evaluation and pain Details: 18 G 1.5 in needle, ultrasound-guided posterior approach  Arthrogram: No  Medications: 9 mL bupivacaine 0.5 %; 40 mg methylPREDNISolone acetate 40 MG/ML; 5 mL lidocaine 1 % Outcome: tolerated well, no immediate complications Procedure, treatment alternatives, risks and benefits explained, specific risks discussed. Consent was given by the patient. Immediately prior to procedure a time out was called to verify the correct patient, procedure, equipment, support staff and site/side marked as required. Patient was prepped and draped in the usual sterile fashion.    Medium Joint Inj: R acromioclavicular on 05/27/2022 10:07 PM Indications: diagnostic evaluation and pain Details: 25 G 1.5 in needle, ultrasound-guided superior approach Medications: 3 mL lidocaine 1 %; 0.66 mL bupivacaine 0.25 %; 13.33 mg methylPREDNISolone acetate 40 MG/ML Outcome: tolerated well, no immediate complications Procedure, treatment alternatives, risks and benefits explained, specific risks discussed. Consent was given by the  patient. Immediately prior to procedure a time out was called to verify the correct patient, procedure, equipment, support staff and site/side marked as required. Patient was prepped and draped in the usual sterile fashion.       Clinical Data: No additional findings.  Objective: Vital Signs: There were no vitals taken for this visit.  Physical Exam:  Constitutional: Patient appears well-developed HEENT:  Head:  Normocephalic Eyes:EOM are normal Neck: Normal range of motion Cardiovascular: Normal rate Pulmonary/chest: Effort normal Neurologic: Patient is alert Skin: Skin is warm Psychiatric: Patient has normal mood and affect  Ortho Exam: Ortho exam demonstrates mild tenderness on the right AC joint compared to the left.  Range of motion on the left is 45/115/175.  Range of motion on the right is 50/115/175.  Has good rotator cuff strength infraspinatus supraspinatus and subscap muscle testing on both the right and left-hand side.  Does have coarseness with passive range of motion at 90 degrees of abduction on the right which is not present on the left.  Cervical spine range of motion is full.  Deltoid is functional.  Specialty Comments:  No specialty comments available.  Imaging: No results found.   PMFS History: Patient Active Problem List   Diagnosis Date Noted   Gastroenteritis 04/23/2022   AVN (avascular necrosis of bone) 08/02/2021   History of hepatitis C 04/12/2021   Alcohol use disorder, severe, dependence 04/12/2021   Chronic pain 11/09/2020   Tooth abscess 01/14/2020   Tongue lesion 10/11/2019   Carpal tunnel syndrome 08/24/2019   Onychomycosis 08/24/2019   Cervicalgia 06/02/2019   Left hip pain 04/06/2019   Abdominal pain 02/28/2019   Shoulder pain 12/17/2018   Hyperlipidemia 09/01/2018   HTN (hypertension) 07/24/2017   Seizure 07/24/2017   RA (rheumatoid arthritis) 07/24/2017   Numbness in feet 03/04/2016   Degenerative joint disease 12/28/2015   OSA (obstructive sleep apnea) 11/23/2015   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   Alcohol abuse with alcohol-induced mood disorder 07/26/2013   Bipolar disorder, unspecified (HCC) 10/28/2012   Alcohol dependence 04/02/2012   Alcohol withdrawal 04/02/2012   Major depression 04/02/2012   Polysubstance abuse (HCC) 01/25/2011   TOBACCO ABUSE 02/09/2010   SUBSTANCE ABUSE 02/09/2010   Tobacco dependence syndrome  02/09/2010   FIBRILLATION, ATRIAL 06/08/2009   Past Medical History:  Diagnosis Date   A-fib    Acid reflux    Alcohol abuse    Anxiety    Asthma    AVN (avascular necrosis of bone)    Chronic back pain    COPD (chronic obstructive pulmonary disease)    Depression    Hepatitis C    history of   History of urinary frequency    Hypertension    Peripheral neuropathy    hands and feet   Polysubstance abuse 01/25/2011   Rheumatoid arteritis    Rheumatoid arteritis    Seizures     Family History  Problem Relation Age of Onset   Heart attack Mother    Atrial fibrillation Mother    Skin cancer Father    Colon polyps Neg Hx    Esophageal cancer Neg Hx    Stomach cancer Neg Hx    Rectal cancer Neg Hx     Past Surgical History:  Procedure Laterality Date   APPENDECTOMY     CARDIOVERSION  03/07/2006   CARPAL TUNNEL RELEASE     CERVICAL FUSION     LAPAROSCOPIC APPENDECTOMY N/A 07/23/2017   Procedure: APPENDECTOMY LAPAROSCOPIC;  Surgeon:  Violeta Gelinas, MD;  Location: [facility] OR;  Service: General;  Laterality: N/A;   TOTAL HIP ARTHROPLASTY Right 2016   Social History   Occupational History   Not on file  Tobacco Use   Smoking status: Every Day    Packs/day: 1.00    Years: 34.00    Additional pack years: 0.00    Total pack years: 34.00    Types: Cigarettes    Start date: 02/12/1984    Passive exposure: Current   Smokeless tobacco: Former    Quit date: 10/24/2012   Tobacco comments:    1.5-2ppd  Quit attempt 12/24/2018  Vaping Use   Vaping Use: Never used  Substance and Sexual Activity   Alcohol use: Yes    Comment: socially   Drug use: Not Currently    Types: "Crack" cocaine, Cocaine    Comment: last use 07/26/13   Sexual activity: Never

## 2022-05-28 ENCOUNTER — Telehealth: Payer: Self-pay

## 2022-05-28 NOTE — Telephone Encounter (Signed)
-----   Message from Cammy Copa, MD sent at 05/27/2022 10:08 PM EDT ----- Cresenciano Lick can you have [name] see Franky Macho in 6 weeks.  Thanks

## 2022-05-28 NOTE — Telephone Encounter (Signed)
Pls make sure appt scheduled per Dr Dean  

## 2022-06-03 ENCOUNTER — Encounter
Payer: Medicaid Other | Attending: Physical Medicine and Rehabilitation | Admitting: Physical Medicine and Rehabilitation

## 2022-06-03 ENCOUNTER — Encounter: Payer: Self-pay | Admitting: Physical Medicine and Rehabilitation

## 2022-06-03 VITALS — BP 139/84 | HR 70 | Ht 68.0 in | Wt 244.4 lb

## 2022-06-03 DIAGNOSIS — M25511 Pain in right shoulder: Secondary | ICD-10-CM | POA: Diagnosis present

## 2022-06-03 DIAGNOSIS — F17200 Nicotine dependence, unspecified, uncomplicated: Secondary | ICD-10-CM | POA: Diagnosis present

## 2022-06-03 DIAGNOSIS — G8929 Other chronic pain: Secondary | ICD-10-CM | POA: Insufficient documentation

## 2022-06-03 DIAGNOSIS — G4701 Insomnia due to medical condition: Secondary | ICD-10-CM

## 2022-06-03 MED ORDER — TOPIRAMATE 25 MG PO TABS: 25.0000 mg | ORAL_TABLET | Freq: Every evening | ORAL | 3 refills | Status: DC
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
## 2022-06-03 NOTE — Patient Instructions (Signed)
Insomnia: -Try to go outside near sunrise -Get exercise during the day.  -Turn off all devices an hour before bedtime.  -Teas that can benefit: chamomile, valerian root, Brahmi (Bacopa) -Can consider over the counter melatonin, magnesium, and/or L-theanine. Melatonin is an anti-oxidant with multiple health benefits. Magnesium is involved in greater than 300 enzymatic reactions in the body and most of us are deficient as our soil is often depleted. There are 7 different types of magnesium- Bioptemizer's is a supplement with all 7 types, and each has unique benefits. Magnesium can also help with constipation and anxiety.  -Pistachios naturally increase the production of melatonin -Cozy Earth bamboo bed sheets are free from toxic chemicals.  -Tart cherry juice or a tart cherry supplement can improve sleep and soreness post-workout  Foods that may reduce pain: 1) Ginger (especially studied for arthritis)- reduce leukotriene production to decrease inflammation 2) Blueberries- high in phytonutrients that decrease inflammation 3) Salmon- marine omega-3s reduce joint swelling and pain 4) Pumpkin seeds- reduce inflammation 5) dark chocolate- reduces inflammation 6) turmeric- reduces inflammation 7) tart cherries - reduce pain and stiffness 8) extra virgin olive oil - its compound olecanthal helps to block prostaglandins  9) chili peppers- can be eaten or applied topically via capsaicin 10) mint- helpful for headache, muscle aches, joint pain, and itching 11) garlic- reduces inflammation  Link to further information on diet for chronic pain: [email or web address]  

## 2022-06-19 ENCOUNTER — Ambulatory Visit (AMBULATORY_SURGERY_CENTER): Payer: Medicaid Other | Admitting: *Deleted

## 2022-06-19 VITALS — Ht 68.0 in | Wt 240.0 lb

## 2022-06-19 DIAGNOSIS — Z860100 Personal history of colon polyps, unspecified: Secondary | ICD-10-CM

## 2022-06-19 MED ORDER — NA SULFATE-K SULFATE-MG SULF 17.5-3.13-1.6 GM/177ML PO SOLN: 1.0000 | Freq: Once | ORAL | 0 refills | Status: AC

## 2022-06-19 NOTE — Progress Notes (Signed)
No egg or soy allergy known to patient  No issues known to pt with past sedation with any surgeries or procedures Patient denies ever being told they had issues or difficulty with intubation  No FH of Malignant Hyperthermia Pt is not on diet pills Pt is not on  home 02  Pt is not on blood thinners  Pt denies issues with constipation  H/o A fib year 2008 did a cardioversion went back into rhythm no A flutter Have any cardiac testing pending--no Pt instructed to use [email or web address] or GoodRx for a price reduction on prep

## 2022-06-30 ENCOUNTER — Ambulatory Visit (HOSPITAL_COMMUNITY)
Admission: EM | Admit: 2022-06-30 | Discharge: 2022-06-30 | Disposition: A | Discharge status: Home / Self Care | Payer: Medicaid Other | Attending: Urgent Care | Admitting: Urgent Care

## 2022-06-30 ENCOUNTER — Ambulatory Visit (INDEPENDENT_AMBULATORY_CARE_PROVIDER_SITE_OTHER): Payer: Medicaid Other

## 2022-06-30 ENCOUNTER — Encounter (HOSPITAL_COMMUNITY): Payer: Self-pay | Admitting: Emergency Medicine

## 2022-06-30 ENCOUNTER — Other Ambulatory Visit: Payer: Self-pay

## 2022-06-30 DIAGNOSIS — S161XXA Strain of muscle, fascia and tendon at neck level, initial encounter: Secondary | ICD-10-CM | POA: Diagnosis not present

## 2022-06-30 DIAGNOSIS — Z981 Arthrodesis status: Secondary | ICD-10-CM

## 2022-06-30 MED ORDER — METHYLPREDNISOLONE ACETATE 40 MG/ML IJ SUSP
INTRAMUSCULAR | Status: AC
  Filled 2022-06-30: qty 1

## 2022-06-30 MED ORDER — CYCLOBENZAPRINE HCL 5 MG PO TABS: 5.0000 mg | ORAL_TABLET | Freq: Three times a day (TID) | ORAL | 0 refills | Status: DC | PRN

## 2022-06-30 MED ORDER — METHYLPREDNISOLONE ACETATE 40 MG/ML IJ SUSP
40.0000 mg | Freq: Once | INTRAMUSCULAR | Status: AC
  Administered 2022-06-30: 40 mg via INTRAMUSCULAR

## 2022-06-30 NOTE — ED Triage Notes (Signed)
Patient fell outside when walking up steps.  Grabbed at hand rail and missed.  Fell down 3-4 steps.  No LOC.  Patient reports neck, left arm and left leg pain.  Reports left hand, fingers are more numb than usual ( left little and ring finger are reported as normally numb).    Has not had any medications for this  Reports he was wearing current clothing.  Patient does not have any abrasions or dirt on clothing.

## 2022-06-30 NOTE — ED Provider Notes (Addendum)
[facility]   MRN: [id number] DOB: 06/07/1970  Subjective:   Blake Arnold is a 52 y.o. male presenting for suffering an accidental fall this morning.  Patient was trying to walk down some steps and unfortunately did not realize the rail had been removed.  This caused the patient to fall forward but he cannot remember exactly what what made impact first.  Denies loss of consciousness, head injury.  No confusion, vision changes.  He does have significant neck pain that radiates into his left arm with associated numbness and tingling of the fingers and hand more so than his usual neuropathy.  Has a history of 2 neck surgeries for the same problem, cervical fusion as seen in his medical chart.  Pertinent medical history includes seizures, atrial fibrillation, hepatitis C.  Patient is status post cardioversion from 03/07/2006.  Has previously used methylprednisolone without issues.  No current facility-administered medications for this encounter.  Current Outpatient Medications:    albuterol (VENTOLIN HFA) 108 (90 Base) MCG/ACT inhaler, Inhale 1-2 puffs into the lungs every 6 (six) hours as needed for wheezing or shortness of breath., Disp: 18 g, Rfl: 0   amLODipine (NORVASC) 10 MG tablet, Take 1 tablet (10 mg total) by mouth daily., Disp: 90 tablet, Rfl: 3   diclofenac Sodium (VOLTAREN) 1 % GEL, Apply 1 application topically 3 (three) times daily. (Patient taking differently: Apply 2 g topically 3 (three) times daily as needed (for pain- affected sites).), Disp: 2 g, Rfl: 3   gabapentin (NEURONTIN) 300 MG capsule, Take 1 capsule (300 mg total) by mouth 3 (three) times daily. (Patient not taking: Reported on 06/19/2022), Disp: , Rfl:    loratadine (CLARITIN) 10 MG tablet, Take 10 mg by mouth daily as needed for rhinitis or allergies. (Patient not taking: Reported on 06/19/2022), Disp: , Rfl:    metoprolol tartrate (LOPRESSOR) 25 MG tablet, Take 1 tablet (25 mg total) by mouth 2 (two)  times daily., Disp: , Rfl:    naltrexone (DEPADE) 50 MG tablet, Take 1 tablet (50 mg total) by mouth daily., Disp: , Rfl:    ondansetron (ZOFRAN) 4 MG tablet, Take 1 tablet (4 mg total) by mouth every 8 (eight) hours as needed for nausea or vomiting. (Patient not taking: Reported on 06/19/2022), Disp: 20 tablet, Rfl: 0   topiramate (TOPAMAX) 25 MG tablet, Take 1 tablet (25 mg total) by mouth at bedtime., Disp: 90 tablet, Rfl: 3   Allergies  Allergen Reactions   Lisinopril Swelling and Other (See Comments)    Swelling of the lips    Past Medical History:  Diagnosis Date   A-fib (HCC)    Acid reflux    Alcohol abuse    Allergy    seasonal   Anxiety    Asthma    AVN (avascular necrosis of bone) (HCC)    Blood transfusion without reported diagnosis    "during surgery"   Chronic back pain    COPD (chronic obstructive pulmonary disease) (HCC)    Depression    Hepatitis C    history of   History of urinary frequency    Hyperlipidemia    Hypertension    Peripheral neuropathy    hands and feet   Polysubstance abuse (HCC) 01/25/2011   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HCC)    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HCC)    Seizures (HCC)    15 years ago   Sleep apnea      Past Surgical History:  Procedure Laterality Date  APPENDECTOMY     CARDIOVERSION  03/07/2006   CARPAL TUNNEL RELEASE     CERVICAL FUSION     LAPAROSCOPIC APPENDECTOMY N/A 07/23/2017   Procedure: APPENDECTOMY LAPAROSCOPIC;  Surgeon: Violeta Gelinas, MD;  Location: [facility] OR;  Service: General;  Laterality: N/A;   TOTAL HIP ARTHROPLASTY Right 2016    Family History  Problem Relation Age of Onset   Heart attack Mother    Atrial fibrillation Mother    Lung cancer Mother    Skin cancer Father    Colon polyps Neg Hx    Esophageal cancer Neg Hx    Stomach cancer Neg Hx    Rectal cancer Neg Hx    Colon cancer Neg Hx    Crohn's disease Neg Hx    Ulcerative colitis Neg Hx     Social History   Tobacco Use   Smoking status: Every Day     Packs/day: 1.00    Years: 34.00    Additional pack years: 0.00    Total pack years: 34.00    Types: Cigarettes    Start date: 02/12/1984    Passive exposure: Current   Smokeless tobacco: Former    Quit date: 10/24/2012   Tobacco comments:    1.5-2ppd  Quit attempt 12/24/2018  Vaping Use   Vaping Use: Never used  Substance Use Topics   Alcohol use: Yes    Comment: socially   Drug use: Not Currently    Types: "Crack" cocaine, Cocaine    Comment: last use 07/26/13    ROS   Objective:   Vitals: BP (!) 137/92 (BP Location: Right Arm) Comment (BP Location): large cuff  Pulse 80   Temp 98.1 F (36.7 C) (Oral)   Resp 20   SpO2 97%   Physical Exam Constitutional:      General: He is not in acute distress.    Appearance: Normal appearance. He is well-developed and normal weight. He is not ill-appearing, toxic-appearing or diaphoretic.  HENT:     Head: Normocephalic and atraumatic. No raccoon eyes, Battle's sign, abrasion, contusion, masses, right periorbital erythema, left periorbital erythema or laceration. Hair is normal.     Right Ear: External ear normal.     Left Ear: External ear normal.     Nose: Nose normal.     Mouth/Throat:     Pharynx: Oropharynx is clear.  Eyes:     General: No scleral icterus.       Right eye: No discharge.        Left eye: No discharge.     Extraocular Movements: Extraocular movements intact.  Cardiovascular:     Rate and Rhythm: Normal rate.  Pulmonary:     Effort: Pulmonary effort is normal.  Musculoskeletal:     Cervical back: Spasms and tenderness present. No swelling, edema, deformity, erythema, signs of trauma, lacerations, rigidity, torticollis, bony tenderness or crepitus. Pain with movement (+ Spurling maneuver to the left) present. Decreased range of motion.       Back:  Neurological:     Mental Status: He is alert and oriented to person, place, and time.     Cranial Nerves: No cranial nerve deficit.     Motor: No weakness.      Coordination: Coordination normal.     Gait: Gait normal.     Deep Tendon Reflexes: Reflexes normal.  Psychiatric:        Mood and Affect: Mood normal.        Behavior: Behavior normal.  Thought Content: Thought content normal.        Judgment: Judgment normal.     DG Cervical Spine Complete  Result Date: 06/30/2022 CLINICAL DATA:  Larey Seat, neck pain EXAM: CERVICAL SPINE - COMPLETE 4+ VIEW COMPARISON:  06/17/2021 FINDINGS: Frontal, bilateral oblique, and lateral views of the cervical spine are obtained. Postsurgical changes are seen from ACDF and posterior fusion spanning C5 through C7. There has been interval partial removal of the retracted screw within the left aspect of the C7 vertebral body seen previously. No evidence of orthopedic hardware failure or loosening on this exam. Alignment is anatomic. There are no acute displaced fractures. There is mild facet hypertrophy at C2-3 and C3-4. Mild spondylosis at C4-5. Prevertebral soft tissues are unremarkable. Lung apices are clear. IMPRESSION: 1. No acute cervical spine fracture. 2. Postsurgical changes at C5 through C7 as above, with interval removal of the retracted screw seen in the C7 vertebral body previously. No evidence of hardware failure or loosening on this exam. 3. Mild upper cervical degenerative changes as above. Electronically Signed   By: Sharlet Salina M.D.   On: 06/30/2022 16:27    IM Depomedrol 40mg  administered in clinic.   Assessment and Plan :   PDMP not reviewed this encounter.  1. Cervical strain, initial encounter   2. History of fusion of cervical spine    Low suspicion for an acute intracranial injury.  Given his second history recommended IM Depo-Medrol especially for his severe pain and the fact that he has responded well to this in the past without much issue.  Follow-up with his neck surgeon as soon as possible.  Counseled patient on potential for adverse effects with medications prescribed/recommended today,  ER and return-to-clinic precautions discussed, patient verbalized understanding.     Wallis Bamberg, [place] 06/30/22 [id number]

## 2022-06-30 NOTE — Discharge Instructions (Addendum)
You have received a Depomedrol injection of 40mg  and this is for your cervical strain given your neck history. Can also use a muscle relaxant as needed. Please follow up as soon as possible with your neck surgeon.

## 2022-07-02 ENCOUNTER — Encounter
Payer: Medicaid Other | Attending: Physical Medicine and Rehabilitation | Admitting: Physical Medicine and Rehabilitation

## 2022-07-02 VITALS — BP 120/87 | HR 80 | Ht 68.0 in | Wt 245.6 lb

## 2022-07-02 DIAGNOSIS — I10 Essential (primary) hypertension: Secondary | ICD-10-CM | POA: Insufficient documentation

## 2022-07-02 DIAGNOSIS — M25511 Pain in right shoulder: Secondary | ICD-10-CM | POA: Diagnosis not present

## 2022-07-02 DIAGNOSIS — W19XXXS Unspecified fall, sequela: Secondary | ICD-10-CM | POA: Diagnosis not present

## 2022-07-02 DIAGNOSIS — F17210 Nicotine dependence, cigarettes, uncomplicated: Secondary | ICD-10-CM | POA: Diagnosis not present

## 2022-07-02 DIAGNOSIS — G4700 Insomnia, unspecified: Secondary | ICD-10-CM | POA: Diagnosis not present

## 2022-07-02 DIAGNOSIS — Z716 Tobacco abuse counseling: Secondary | ICD-10-CM | POA: Insufficient documentation

## 2022-07-02 DIAGNOSIS — M47812 Spondylosis without myelopathy or radiculopathy, cervical region: Secondary | ICD-10-CM | POA: Insufficient documentation

## 2022-07-02 DIAGNOSIS — M069 Rheumatoid arthritis, unspecified: Secondary | ICD-10-CM | POA: Insufficient documentation

## 2022-07-02 DIAGNOSIS — M79602 Pain in left arm: Secondary | ICD-10-CM | POA: Insufficient documentation

## 2022-07-02 DIAGNOSIS — M79605 Pain in left leg: Secondary | ICD-10-CM | POA: Insufficient documentation

## 2022-07-02 DIAGNOSIS — M1990 Unspecified osteoarthritis, unspecified site: Secondary | ICD-10-CM | POA: Insufficient documentation

## 2022-07-02 DIAGNOSIS — M4802 Spinal stenosis, cervical region: Secondary | ICD-10-CM | POA: Insufficient documentation

## 2022-07-02 DIAGNOSIS — Z6837 Body mass index (BMI) 37.0-37.9, adult: Secondary | ICD-10-CM | POA: Diagnosis not present

## 2022-07-02 DIAGNOSIS — G8929 Other chronic pain: Secondary | ICD-10-CM | POA: Insufficient documentation

## 2022-07-02 MED ORDER — CAPSAICIN-CLEANSING GEL 8 % EX KIT
2.0000 | PACK | Freq: Once | CUTANEOUS | Status: AC
  Administered 2022-07-02: 2 via TOPICAL
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
## 2022-07-04 ENCOUNTER — Ambulatory Visit (AMBULATORY_SURGERY_CENTER): Payer: Medicaid Other | Admitting: Internal Medicine

## 2022-07-04 ENCOUNTER — Encounter: Payer: Self-pay | Admitting: Internal Medicine

## 2022-07-04 VITALS — BP 98/68 | HR 64 | Temp 98.6°F | Resp 17 | Ht 68.0 in | Wt 240.0 lb

## 2022-07-04 DIAGNOSIS — D123 Benign neoplasm of transverse colon: Secondary | ICD-10-CM | POA: Diagnosis not present

## 2022-07-04 DIAGNOSIS — K635 Polyp of colon: Secondary | ICD-10-CM | POA: Diagnosis not present

## 2022-07-04 DIAGNOSIS — D122 Benign neoplasm of ascending colon: Secondary | ICD-10-CM | POA: Diagnosis not present

## 2022-07-04 DIAGNOSIS — Z09 Encounter for follow-up examination after completed treatment for conditions other than malignant neoplasm: Secondary | ICD-10-CM | POA: Diagnosis not present

## 2022-07-04 DIAGNOSIS — Z86010 Personal history of colonic polyps: Secondary | ICD-10-CM | POA: Diagnosis not present

## 2022-07-04 DIAGNOSIS — D124 Benign neoplasm of descending colon: Secondary | ICD-10-CM

## 2022-07-04 DIAGNOSIS — D125 Benign neoplasm of sigmoid colon: Secondary | ICD-10-CM

## 2022-07-04 MED ORDER — SODIUM CHLORIDE 0.9 % IV SOLN
500.0000 mL | Freq: Once | INTRAVENOUS | Status: DC
Start: 2022-07-04 — End: 2022-07-04

## 2022-07-04 NOTE — Op Note (Signed)
[facility] Patient Name: Blake Arnold Procedure Date: 07/04/2022 9:58 AM MRN: [id number] Endoscopist: Wilhemina Bonito. Marina Goodell , MD, [id number] Age: 52 Referring MD:  Date of Birth: 03-14-1970 Gender: Male Account #: [id number] Procedure:                Colonoscopy with cold snare polypectomy x 4; biopsy                            polypectomy x 1 Indications:              High risk colon cancer surveillance: Personal                            history of multiple (3 or more) adenomas. Previous                            examination 2021 (Dr. Christella Hartigan). Medicines:                Monitored Anesthesia Care Procedure:                Pre-Anesthesia Assessment:                           - Prior to the procedure, a History and Physical                            was performed, and patient medications and                            allergies were reviewed. The patient's tolerance of                            previous anesthesia was also reviewed. The risks                            and benefits of the procedure and the sedation                            options and risks were discussed with the patient.                            All questions were answered, and informed consent                            was obtained. Prior Anticoagulants: The patient has                            taken no anticoagulant or antiplatelet agents. ASA                            Grade Assessment: II - A patient with mild systemic                            disease. After reviewing the risks and benefits,  the patient was deemed in satisfactory condition to                            undergo the procedure.                           After obtaining informed consent, the colonoscope                            was passed under direct vision. Throughout the                            procedure, the patient's blood pressure, pulse, and                            oxygen saturations were  monitored continuously. The                            Olympus CF-HQ190L [id number]) Colonoscope was                            introduced through the anus and advanced to the the                            cecum, identified by appendiceal orifice and                            ileocecal valve. The ileocecal valve, appendiceal                            orifice, and rectum were photographed. The quality                            of the bowel preparation was adequate to identify                            polyps. The colonoscopy was performed without                            difficulty. The patient tolerated the procedure                            well. The bowel preparation used was SUPREP via                            split dose instruction. Scope In: 10:12:41 AM Scope Out: 10:26:07 AM Scope Withdrawal Time: 0 hours 12 minutes 7 seconds  Total Procedure Duration: 0 hours 13 minutes 26 seconds  Findings:                 Four polyps were found in the sigmoid colon,                            descending colon and ascending colon. The polyps  were 2 to 5 mm in size. These polyps were removed                            with a cold snare. Resection and retrieval were                            complete.                           A 1 mm polyp was found in the transverse colon. The                            polyp was removed with a jumbo cold forceps.                            Resection and retrieval were complete.                           A few diverticula were found in the left colon.                           The exam was otherwise without abnormality on                            direct and retroflexion views. Complications:            No immediate complications. Estimated blood loss:                            None. Estimated Blood Loss:     Estimated blood loss: none. Impression:               - Four 2 to 5 mm polyps in the sigmoid colon, in                             the descending colon and in the ascending colon,                            removed with a cold snare. Resected and retrieved.                           - One 1 mm polyp in the transverse colon, removed                            with a jumbo cold forceps. Resected and retrieved.                           - Diverticulosis in the left colon.                           - The examination was otherwise normal on direct                            and retroflexion views. Recommendation:           -  Repeat colonoscopy in 3 years for surveillance.                           - Patient has a contact number available for                            emergencies. The signs and symptoms of potential                            delayed complications were discussed with the                            patient. Return to normal activities tomorrow.                            Written discharge instructions were provided to the                            patient.                           - Resume previous diet.                           - Continue present medications.                           - Await pathology results. Wilhemina Bonito. Marina Goodell, MD 07/04/2022 10:32:57 AM This report has been signed electronically.

## 2022-07-04 NOTE — Progress Notes (Signed)
Called to room to assist during endoscopic procedure.  Patient ID and intended procedure confirmed with present staff. Received instructions for my participation in the procedure from the performing physician.  

## 2022-07-04 NOTE — Progress Notes (Signed)
Pt's states no medical or surgical changes since previsit or office visit. 

## 2022-07-04 NOTE — Patient Instructions (Signed)
Resume previous diet and medications. Awaiting pathology results. Repeat Colonoscopy date to be determined based on pathology results. Likely in 3 years for surveillance.  YOU HAD AN ENDOSCOPIC PROCEDURE TODAY AT THE [facility]:   Refer to the procedure report that was given to you for any specific questions about what was found during the examination.  If the procedure report does not answer your questions, please call your gastroenterologist to clarify.  If you requested that your care partner not be given the details of your procedure findings, then the procedure report has been included in a sealed envelope for you to review at your convenience later.  YOU SHOULD EXPECT: Some feelings of bloating in the abdomen. Passage of more gas than usual.  Walking can help get rid of the air that was put into your GI tract during the procedure and reduce the bloating. If you had a lower endoscopy (such as a colonoscopy or flexible sigmoidoscopy) you may notice spotting of blood in your stool or on the toilet paper. If you underwent a bowel prep for your procedure, you may not have a normal bowel movement for a few days.  Please Note:  You might notice some irritation and congestion in your nose or some drainage.  This is from the oxygen used during your procedure.  There is no need for concern and it should clear up in a day or so.  SYMPTOMS TO REPORT IMMEDIATELY:  Following lower endoscopy (colonoscopy or flexible sigmoidoscopy):  Excessive amounts of blood in the stool  Significant tenderness or worsening of abdominal pains  Swelling of the abdomen that is new, acute  Fever of 100F or higher   For urgent or emergent issues, a gastroenterologist can be reached at any hour by calling [phone]. Do not use MyChart messaging for urgent concerns.    DIET:  We do recommend a small meal at first, but then you may proceed to your regular diet.  Drink plenty of fluids but you should avoid  alcoholic beverages for 24 hours.  ACTIVITY:  You should plan to take it easy for the rest of today and you should NOT DRIVE or use heavy machinery until tomorrow (because of the sedation medicines used during the test).    FOLLOW UP: Our staff will call the number listed on your records the next business day following your procedure.  We will call around 7:15- 8:00 am to check on you and address any questions or concerns that you may have regarding the information given to you following your procedure. If we do not reach you, we will leave a message.     If any biopsies were taken you will be contacted by phone or by letter within the next 1-3 weeks.  Please call us at [phone] if you have not heard about the biopsies in 3 weeks.    SIGNATURES/CONFIDENTIALITY: You and/or your care partner have signed paperwork which will be entered into your electronic medical record.  These signatures attest to the fact that that the information above on your After Visit Summary has been reviewed and is understood.  Full responsibility of the confidentiality of this discharge information lies with you and/or your care-partner.

## 2022-07-04 NOTE — Progress Notes (Signed)
Report to PACU, RN, vss, BBS= Clear.  

## 2022-07-04 NOTE — Progress Notes (Signed)
HISTORY OF PRESENT ILLNESS:  Blake Arnold is a 52 y.o. male with a history of multiple adenomatous colon polyps 2021.  Now for surveillance.  No complaints  REVIEW OF SYSTEMS:  All non-GI ROS negative except for  Past Medical History:  Diagnosis Date   A-fib (HCC)    Acid reflux    Alcohol abuse    Allergy    seasonal   Anxiety    Asthma    AVN (avascular necrosis of bone) (HCC)    Blood transfusion without reported diagnosis    "during surgery"   Chronic back pain    COPD (chronic obstructive pulmonary disease) (HCC)    Depression    Hepatitis C    history of   History of urinary frequency    Hyperlipidemia    Hypertension    Peripheral neuropathy    hands and feet   Polysubstance abuse (HCC) 01/25/2011   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HCC)    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HCC)    Seizures (HCC)    15 years ago   Sleep apnea     Past Surgical History:  Procedure Laterality Date   APPENDECTOMY     CARDIOVERSION  03/07/2006   CARPAL TUNNEL RELEASE     CERVICAL FUSION     LAPAROSCOPIC APPENDECTOMY N/A 07/23/2017   Procedure: APPENDECTOMY LAPAROSCOPIC;  Surgeon: Violeta Gelinas, MD;  Location: [facility] OR;  Service: General;  Laterality: N/A;   TOTAL HIP ARTHROPLASTY Right 2016    Social History Blake Arnold  reports that he has been smoking cigarettes. He started smoking about 38 years ago. He has a 34.00 pack-year smoking history. He has been exposed to tobacco smoke. He quit smokeless tobacco use about 9 years ago. He reports that he does not currently use alcohol. He reports that he does not currently use drugs after having used the following drugs: "Crack" cocaine and Cocaine.  family history includes Atrial fibrillation in his mother; Heart attack in his mother; Lung cancer in his mother; Skin cancer in his father.  Allergies  Allergen Reactions   Lisinopril Swelling and Other (See Comments)    Swelling of the lips       PHYSICAL EXAMINATION: Vital signs: BP 120/77    Pulse 63   Temp 98.6 F (37 C)   Resp 11   Ht 5\' 8"  (1.727 m)   Wt 240 lb (108.9 kg)   SpO2 96%   BMI 36.49 kg/m  General: Well-developed, well-nourished, no acute distress HEENT: Sclerae are anicteric, conjunctiva pink. Oral mucosa intact Lungs: Clear Heart: Regular Abdomen: soft, nontender, nondistended, no obvious ascites, no peritoneal signs, normal bowel sounds. No organomegaly. Extremities: No edema Psychiatric: alert and oriented x3. Cooperative     ASSESSMENT:  Personal history of multiple adenomatous colon polyps   PLAN:  Surveillance colonoscopy

## 2022-07-05 ENCOUNTER — Telehealth: Payer: Self-pay | Admitting: *Deleted

## 2022-07-05 NOTE — Telephone Encounter (Signed)
No answer on  follow up call. Left message.   

## 2022-07-09 ENCOUNTER — Encounter: Payer: Self-pay | Admitting: Internal Medicine

## 2022-07-10 ENCOUNTER — Other Ambulatory Visit: Payer: Self-pay

## 2022-07-10 ENCOUNTER — Ambulatory Visit (INDEPENDENT_AMBULATORY_CARE_PROVIDER_SITE_OTHER): Payer: Medicaid Other | Admitting: Surgical

## 2022-07-10 DIAGNOSIS — R200 Anesthesia of skin: Secondary | ICD-10-CM | POA: Diagnosis not present

## 2022-07-10 DIAGNOSIS — M79602 Pain in left arm: Secondary | ICD-10-CM

## 2022-07-10 DIAGNOSIS — M19011 Primary osteoarthritis, right shoulder: Secondary | ICD-10-CM | POA: Diagnosis not present

## 2022-07-12 ENCOUNTER — Ambulatory Visit: Admit: 2022-07-12 | Discharge: 2022-07-12 | Payer: MEDICAID

## 2022-07-12 DIAGNOSIS — R768 Other specified abnormal immunological findings in serum: Principal | ICD-10-CM

## 2022-07-12 DIAGNOSIS — Z111 Encounter for screening for respiratory tuberculosis: Principal | ICD-10-CM

## 2022-07-12 DIAGNOSIS — Z79631 Methotrexate, long term, current use: Principal | ICD-10-CM

## 2022-07-12 DIAGNOSIS — Z1159 Encounter for screening for other viral diseases: Principal | ICD-10-CM

## 2022-07-12 DIAGNOSIS — M059 Rheumatoid arthritis with rheumatoid factor, unspecified: Principal | ICD-10-CM

## 2022-07-12 LAB — CBC W/ AUTO DIFF
BASOPHILS ABSOLUTE COUNT: 0.1 10*9/L (ref 0.0–0.1)
BASOPHILS RELATIVE PERCENT: 0.6 %
EOSINOPHILS ABSOLUTE COUNT: 0.3 10*9/L (ref 0.0–0.5)
EOSINOPHILS RELATIVE PERCENT: 1.8 %
HEMATOCRIT: 48.2 % — ABNORMAL HIGH (ref 39.0–48.0)
HEMOGLOBIN: 16.7 g/dL — ABNORMAL HIGH (ref 12.9–16.5)
LYMPHOCYTES ABSOLUTE COUNT: 2.8 10*9/L (ref 1.1–3.6)
LYMPHOCYTES RELATIVE PERCENT: 17.8 %
MEAN CORPUSCULAR HEMOGLOBIN CONC: 34.7 g/dL (ref 32.0–36.0)
MEAN CORPUSCULAR HEMOGLOBIN: 32.9 pg — ABNORMAL HIGH (ref 25.9–32.4)
MEAN CORPUSCULAR VOLUME: 94.7 fL (ref 77.6–95.7)
MEAN PLATELET VOLUME: 7.5 fL (ref 6.8–10.7)
MONOCYTES ABSOLUTE COUNT: 1.3 10*9/L — ABNORMAL HIGH (ref 0.3–0.8)
MONOCYTES RELATIVE PERCENT: 8.4 %
NEUTROPHILS ABSOLUTE COUNT: 11.3 10*9/L — ABNORMAL HIGH (ref 1.8–7.8)
NEUTROPHILS RELATIVE PERCENT: 71.4 %
PLATELET COUNT: 297 10*9/L (ref 150–450)
RED BLOOD CELL COUNT: 5.09 10*12/L (ref 4.26–5.60)
RED CELL DISTRIBUTION WIDTH: 12.6 % (ref 12.2–15.2)
WBC ADJUSTED: 15.8 10*9/L — ABNORMAL HIGH (ref 3.6–11.2)

## 2022-07-12 LAB — CREATININE
CREATININE: 0.75 mg/dL
EGFR CKD-EPI (2021) MALE: >90 mL/min/{1.73_m2} (ref >=60)

## 2022-07-12 LAB — HEPATITIS C RNA, QUANTITATIVE, PCR: HCV RNA: NOT DETECTED

## 2022-07-12 LAB — ALBUMIN: ALBUMIN: 4.1 g/dL (ref 3.4–5.0)

## 2022-07-12 LAB — ALT: ALT (SGPT): 51 U/L — ABNORMAL HIGH (ref 10–49)

## 2022-07-12 LAB — AST: AST (SGOT): 36 U/L — ABNORMAL HIGH (ref <=34)

## 2022-07-12 MED ORDER — METHOTREXATE SODIUM (CONTAINS PRESERVATIVES) 25 MG/ML INJECTION SOLUTION
SUBCUTANEOUS | 1 refills | 105 days | Status: CP
Start: 2022-07-12 — End: ?

## 2022-07-12 MED ORDER — FOLIC ACID 1 MG TABLET
ORAL_TABLET | Freq: Every day | ORAL | 3 refills | 90 days | Status: CP
Start: 2022-07-12 — End: 2023-07-12
  Filled 2022-07-16: qty 90, 90d supply, fill #0

## 2022-07-12 MED ORDER — TUBERCULIN SYRINGE 1 ML 25 GAUGE X 5/8"
1 refills | 0 days | Status: CP
Start: 2022-07-12 — End: ?
  Filled 2022-07-16: qty 12, 84d supply, fill #0
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
## 2022-07-12 NOTE — Unmapped (Signed)
[facility]   Specialty Lite Counseling    Joseph Sandoval is a 52 y.o. male with RA who I am counseling today on initiation of therapy.  I am speaking to the patient.    Was a Nurse, learning disability used for this call? No    Verified patient's date of birth / HIPAA.    Specialty Lite medication(s) to be sent: Inflammatory Disorders: methotrexate (injectable)      Non-specialty medications/supplies to be sent: syringes      Medications not needed at this time: n/a         An offer to provide counseling to the patient regarding their medication was made. The patient declined counseling      Current Medications (including OTC/herbals), Comorbidities and Allergies     Current Outpatient Medications   Medication Sig Dispense Refill    acetaminophen (TYLENOL) 500 MG tablet Take 2 tablets (1,000 mg total) by mouth every six (6) hours as needed for pain.      amLODIPine (NORVASC) 10 MG tablet Take 1 tablet (10 mg total) by mouth daily.      diclofenac sodium (VOLTAREN) 1 % gel Apply 4 g topically four (4) times a day as needed.      empty container Misc Use as directed 1 each PRN    folic acid (FOLVITE) 1 MG tablet Take 1 tablet (1 mg total) by mouth daily. 90 tablet 3    ibuprofen (MOTRIN) 800 MG tablet Take 1 tablet (800 mg total) by mouth every eight (8) hours as needed.      loratadine (CLARITIN) 10 mg tablet Take 1 tablet (10 mg total) by mouth daily.      methotrexate sodium (METHOTREXATE, CONTAINS PRESERVATIVES,) 25 mg/mL injection solution Inject 0.8 mL (20 mg total) under the skin every seven (7) days. 12 mL 1    metoprolol tartrate (LOPRESSOR) 25 MG tablet Take 1 tablet (25 mg total) by mouth two (2) times a day.      naltrexone (DEPADE) 50 mg tablet Take 1 tablet (50 mg total) by mouth daily.      nicotine (NICODERM CQ) 21 mg/24 hr patch Place 21 mg on the skin.      simvastatin (ZOCOR) 40 MG tablet Take 1 tablet (40 mg total) by mouth nightly.      syringe with needle (TUBERCULIN SYRINGE) 1 mL 25 gauge x 5/8 Syrg 1 Units by Miscellaneous route every seven (7) days. To be used with methotrexate 50 each 1     No current facility-administered medications for this visit.       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    Appropriateness of  Therapy     Prescription has been clinically reviewed: Yes    Financial Information     Medication Assistance provided: None Required    Anticipated copay of $4 reviewed with patient.     Patient Specific Needs     Does the patient have any physical, cognitive, or cultural barriers? No    Does the patient have adequate living arrangements? (i.e. the ability to store and take their medication appropriately) Yes    Did you identify any home environmental safety or security hazards? No    Patient prefers to have medications discussed with  Patient     Is the patient or caregiver able to read and understand education materials at a high school level or above? Yes    Patient's primary language is  English     Is the patient high risk? No    SOCIAL DETERMINANTS OF HEALTH     At the [facility], we have learned that life circumstances - like trouble affording food, housing, utilities, or transportation can affect the health of many of our patients.   That is why we wanted to ask: are you currently experiencing any life circumstances that are negatively impacting your health and/or quality of life? No    Social Determinants of Health     Financial Resource Strain: Low Risk  (10/05/2021)    Overall Financial Resource Strain (CARDIA)     Difficulty of Paying Living Expenses: Not hard at all   Internet Connectivity: Not on file   Food Insecurity: No Food Insecurity (05/02/2022)    Received from [facility]    Hunger Vital Sign     Worried About Running Out of Food in the Last Year: Never true     Ran Out of Food in the Last Year: Never true   Tobacco Use: High Risk (07/12/2022)    Patient History     Smoking Tobacco Use: Every Day     Smokeless Tobacco Use: Former     Passive Exposure: Not on file   Housing/Utilities: Low Risk  (10/05/2021)    Housing/Utilities     Within the past 12 months, have you ever stayed: outside, in a car, in a tent, in an overnight shelter, or temporarily in someone else's home (i.e. couch-surfing)?: No     Are you worried about losing your housing?: No     Within the past 12 months, have you been unable to get utilities (heat, electricity) when it was really needed?: No   Alcohol Use: Not on file   Transportation Needs: No Transportation Needs (05/02/2022)    Received from [facility] - [facility]     Lack of Transportation (Medical): No     Lack of Transportation (Non-Medical): No   Substance Use: Not on file   Health Literacy: Not on file   Physical Activity: Not on file   Interpersonal Safety: Not on file   Stress: Not on file   Intimate Partner Violence: Not At Risk (05/02/2022)    Received from [facility]    Humiliation, Afraid, Rape, and Kick questionnaire     Fear of Current or Ex-Partner: No     Emotionally Abused: No     Physically Abused: No     Sexually Abused: No   Depression: Not at risk (07/02/2021)    PHQ-2     PHQ-2 Score: 0   Social Connections: Not on file       Would you be willing  to receive help with any of the needs that you have identified today? Not applicable    Delivery Information     Verified delivery address.    Scheduled delivery date: 6/5    Expected start date: 6/5    Medication will be delivered via UPS to the prescription address in [facility].  This shipment will not require a signature.      Explained the services we provide at [facility] and that each month we will send text messages and/or mychart messages to set up refills. Informed patient that refills should be scheduled 7-10 days prior to when they will run out of medication. Informed patient that a welcome packet, containing information about our pharmacy and other support services, a Notice of Privacy Practices, and a drug information handout will be sent.      The patient or caregiver noted above participated in the development of this care plan and knows that they can request review of or adjustments to the care plan at any time.      Patient or caregiver verbalized understanding of the above information as well as how to contact the pharmacy at [phone] option 4 with any questions/concerns.  The pharmacy is open Monday through Friday 8:30am-4:30pm.  A pharmacist is available 24/7 via pager to answer any clinical questions they may have.      Julianne Rice, PharmD  [facility] Specialty Pharmacist

## 2022-07-12 NOTE — Unmapped (Signed)
Rheumatology Clinic at [facility] Information    Nurse Triage Line: [phone] Option 3  Monday-Friday 8AM-4:30PM  Call this number  to report symptoms or if you have questions or concerns prior to your next appointment.     To schedule an appointment: [phone] Option 1  Monday-Friday 8AM-4:30PM    After hours emergencies(after 5PM or during a weekend or holiday):  For life-threatening emergencies, call 911.  If you have an urgent/emergent question after 5PM or during a weekend or holiday, call [phone] and ask the operator to page the Inpatient Rheumatology Consult Fellow. This number is not for prescription refills or general questions that can wait until normal business hours.     MyChart Messages:  Please use MyChart for non-urgent requests such as scheduling appointments, requesting prescription refills, and asking non-urgent questions about your health or followup questions about a recent visit.   Do NOT use MyChart for URGENT messages as these are only checked during normal business hours.  MyChart messages are routed to a central pool and one of your provider's team members will get back to you. Expect up to 3 business days for a response.   New or worsening symptoms should be reported by calling the nurse triage line. Do NOT use myChart messaging to report symptoms.   It is not necessary to both send a MyChart message and leave a message on the nurse triage line regarding the same issue on the same day.     Lab and test results:  During your visit, your provider may order labs, xrays or other tests. These results may take up to 10 business days and results may be communicated to you by our office in the following ways:  If you have signed up for a MyChart account, a private message will be sent to you through the account.  If you do not have a MyChart account, a letter may be sent in the mail.  If you have a return visit scheduled with your provider soon after the testing is done, your provider may wait to discuss the results with you in person.  Our nursing team cannot review lab or diagnostic tests with you unless directed by the provider.  You may notice that your lab results are available as soon as they are resulted--usually within 24 hours. This means you may see your results before your provider does. Please wait for your provider to respond to your results. Not all flagged or red results are always considered clinically significant.  The lab notifies your provider about critical values that need to be addressed urgently.  Otherwise, please wait for your provider to contact you.     Refills/Prior Authorizations:  Please have your pharmacy contact us for refills at least 1 week prior to running out of medication and allow 3 business days for complete processing.  Please have your pharmacy contact us when a prior authorization is needed.  Processing of prior authorizations can take up to 4 weeks.    Arrival time, late arrivals and no-show policy:  Please arrive 20 minutes prior to your scheduled appointment time to allow for registration and  comprehensive medication review.   Patients who arrive 15 minutes after the appointment time may be asked to reschedule.  Patients who no-show to the appointment, arrive late or cancel within 24 hours of the scheduled appointment 3 times in a 64-month period may be dismissed from the clinic per the [facility] No Show/Late Cancellation Policy. [email or web address]  If you  cannot keep your appointment, we kindly request that you call more than 24 hours in advance to reschedule the appointment. We understand that emergencies will occur, and we will do our best to reschedule the appointment.    Provider switch requests:  It is our practice not to accommodate requests for switching providers within our clinic. This practice is in place to ensure continuity of care and maintain the efficiency and effectiveness of our medical services. We thank you for your understanding regarding this practice.    Masking:  Many patients seen in Rheumatology have a weakened immune system. If you are sick with a respiratory illness, please consider rescheduling your appointment. If you arrive at the clinic for your appointment with symptoms of a respiratory illness, you must wear a mask per [facility] Policy for Patients and Visitors.     Insurance/FMLA paperwork:  Please allow 4 weeks for completion of all paperwork submitted.    For questions about billing:  Call Billing Department at [phone]    For questions about costs of tests or infusions:  Call Estimate Line at [phone]    For questions about Financial Assistance for health care visits and procedures:  Call Financial Assistance at [phone] or visit  [email or web address]    For questions about Pharmacy Assistance for medications:  Call Pharmacy Assistance Counselor at [phone] or visit  [email or web address]

## 2022-07-12 NOTE — Unmapped (Signed)
[facility] Specialty Medication Onboarding    Specialty Medication: methotrexate (CONTAINS PRESERVATIVES) 25 mg/mL injection solution  Prior Authorization: Not Required   Financial Assistance: No - copay  <$25  Final Copay/Day Supply: $4 / 34 days    Insurance Restrictions: Yes - max 1 month supply     Notes to Pharmacist: Folic acid $9.19 not covered (90 days)  & Syringes $3.34 (84 days)   Credit Card on File: no    The triage team has completed the benefits investigation and has determined that the patient is able to fill this medication at [facility]. Please contact the patient to complete the onboarding or follow up with the prescribing physician as needed.

## 2022-07-13 ENCOUNTER — Encounter: Payer: Self-pay | Admitting: Surgical

## 2022-07-13 NOTE — Progress Notes (Signed)
Follow-up Office Visit Note   Patient: Blake Arnold           Date of Birth: 1971-02-09           MRN: [id number] Visit Date: 07/10/2022 Requested by: Sabino Dick, DO [address] [place],  [place] [address] PCP: Sabino Dick, DO  Subjective: Chief Complaint  Patient presents with   Right Shoulder - Pain    HPI: Blake Arnold is a 52 y.o. male who returns to the office for follow-up visit.    Plan at last visit was: Impression is right shoulder glenohumeral joint arthritis with intact rotator cuff but also with AC joint arthritis and possible biceps tendinitis. MRI scan confirms intact rotator cuff as well as significant edema in the [facility] joint particular on the clavicular side. Patient wants to hold off on any type of shoulder replacement which I think is understandable. He is going to be going back on his rheumatoid arthritis medication shortly. Plan at this time today is ultrasound-guided [facility] joint injection first followed by ultrasound-guided glenohumeral joint injection and 6-week return for follow-up and decision for or against any further surgical intervention.   Since then, patient notes he had 100% relief of all of his shoulder pain from the 2 injections that he had at his last visit.  Injections only lasted for couple weeks before symptoms returned.  Even more than the pain in his right shoulder however, he complains of left arm pain that he localizes beginning in the left elbow and radiating down the ulnar aspect of the forearm into the fourth and fifth fingers in the left hand.  He has associated numbness and tingling in this area.  He has increased sensitivity throughout the distribution of pain.  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  Does have history of prior cervical fusion at [facility] that was done last August.              ROS: All systems reviewed are negative as they relate to the chief complaint within the history of  present illness.  Patient denies fevers or chills.  Assessment & Plan: Visit Diagnoses:  1. Left arm numbness   2. Arthritis of right acromioclavicular joint   3. Primary osteoarthritis of right shoulder     Plan: Blake Arnold is a 52 y.o. male who returns to the office for follow-up visit for right shoulder pain and left arm pain/numbness.  Plan from last visit was noted above in HPI.  They now return with 100 percent relief temporarily from right shoulder glenohumeral and AC joint injections.  He would like to proceed with surgical intervention for the right shoulder given the short-lived nature of the relief from injections.  However, his left arm is bothering him even more than the right shoulder and so he would like to get this worked up before committing to surgery on the right shoulder.  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  He does have history of prior cervical fusion that was done in last August for similar symptoms but he states that this never really helped his left arm symptoms that he is currently dealing with.  He has a little bit of weakness of finger abduction of the fifth finger compared with the right hand which has no weakness.  No subluxing ulnar nerve.  He does have history of prior carpal tunnel release in the left hand.  Will plan  to obtain nerve conduction study of the left upper extremity to evaluate for ulnar neuropathy and have him follow-up with Dr. August Saucer afterward to review results and decide if he would like to proceed with shoulder surgery or have his left arm symptoms addressed.  Follow-Up Instructions: No follow-ups on file.   Orders:  No orders of the defined types were placed in this encounter.  No orders of the defined types were placed in this encounter.     Procedures: No procedures performed   Clinical Data: No additional findings.  Objective: Vital Signs: There were no vitals taken for this visit.  Physical Exam:   Constitutional: Patient appears well-developed HEENT:  Head: Normocephalic Eyes:EOM are normal Neck: Normal range of motion Cardiovascular: Normal rate Pulmonary/chest: Effort normal Neurologic: Patient is alert Skin: Skin is warm Psychiatric: Patient has normal mood and affect  Ortho Exam: Ortho exam demonstrates right shoulder with 40 degrees X rotation, 100 degrees abduction, 170 degrees forward elevation passively and actively.  Good rotator cuff strength of supra, infra, subscap rated 5/5.  Moderate tenderness over the bicipital groove.  Mild to moderate tenderness over the [facility] joint.  Positive crossarm adduction test.  Positive O'Brien sign.  Axillary nerve intact with deltoid firing.  No weakness throughout the other muscle groups of the right arm.  He does have examination of the left arm demonstrating no subluxing ulnar nerve.  He has 5 -/5 finger abduction strength of the left hand relative to 5/5 strength of the right hand.  He has decreased sensation through the palmar aspect of the fourth and fifth fingers.  Positive Tinel sign over the ulnar nerve at the elbow with reproduction of tingling sensation in the fourth and fifth fingers.  Specialty Comments:  No specialty comments available.  Imaging: No results found.   PMFS History: Patient Active Problem List   Diagnosis Date Noted   Gastroenteritis 04/23/2022   AVN (avascular necrosis of bone) (HCC) 08/02/2021   History of hepatitis C 04/12/2021   Alcohol use disorder, severe, dependence (HCC) 04/12/2021   Chronic pain 11/09/2020   Tooth abscess 01/14/2020   Tongue lesion 10/11/2019   Carpal tunnel syndrome 08/24/2019   Onychomycosis 08/24/2019   Cervicalgia 06/02/2019   Left hip pain 04/06/2019   Abdominal pain 02/28/2019   Shoulder pain 12/17/2018   Hyperlipidemia 09/01/2018   HTN (hypertension) 07/24/2017   Seizure (HCC) 07/24/2017   RA (rheumatoid arthritis) (HCC) 07/24/2017   Numbness in feet 03/04/2016    Degenerative joint disease 12/28/2015   OSA (obstructive sleep apnea) 11/23/2015   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   Alcohol abuse with alcohol-induced mood disorder (HCC) 07/26/2013   Bipolar disorder, unspecified (HCC) 10/28/2012   Alcohol dependence (HCC) 04/02/2012   Alcohol withdrawal (HCC) 04/02/2012   Major depression 04/02/2012   Polysubstance abuse (HCC) 01/25/2011   TOBACCO ABUSE 02/09/2010   SUBSTANCE ABUSE 02/09/2010   Tobacco dependence syndrome 02/09/2010   FIBRILLATION, ATRIAL 06/08/2009   Past Medical History:  Diagnosis Date   A-fib (HCC)    Acid reflux    Alcohol abuse    Allergy    seasonal   Anxiety    Asthma    AVN (avascular necrosis of bone) (HCC)    Blood transfusion without reported diagnosis    "during surgery"   Chronic back pain    COPD (chronic obstructive pulmonary disease) (HCC)    Depression    Hepatitis C    history of   History of urinary frequency  Hyperlipidemia    Hypertension    Peripheral neuropathy    hands and feet   Polysubstance abuse (HCC) 01/25/2011   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HCC)    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HCC)    Seizures (HCC)    15 years ago   Sleep apnea     Family History  Problem Relation Age of Onset   Heart attack Mother    Atrial fibrillation Mother    Lung cancer Mother    Skin cancer Father    Colon polyps Neg Hx    Esophageal cancer Neg Hx    Stomach cancer Neg Hx    Rectal cancer Neg Hx    Colon cancer Neg Hx    Crohn's disease Neg Hx    Ulcerative colitis Neg Hx     Past Surgical History:  Procedure Laterality Date   APPENDECTOMY     CARDIOVERSION  03/07/2006   CARPAL TUNNEL RELEASE     CERVICAL FUSION     LAPAROSCOPIC APPENDECTOMY N/A 07/23/2017   Procedure: APPENDECTOMY LAPAROSCOPIC;  Surgeon: Violeta Gelinas, MD;  Location: [facility] OR;  Service: General;  Laterality: N/A;   TOTAL HIP ARTHROPLASTY Right 2016   Social History   Occupational History   Not on file  Tobacco  Use   Smoking status: Every Day    Packs/day: 1.00    Years: 34.00    Additional pack years: 0.00    Total pack years: 34.00    Types: Cigarettes    Start date: 02/12/1984    Passive exposure: Current   Smokeless tobacco: Former    Quit date: 10/24/2012   Tobacco comments:    1.5-2ppd  Quit attempt 12/24/2018  Vaping Use   Vaping Use: Never used  Substance and Sexual Activity   Alcohol use: Not Currently    Comment: 06/30/2022-states sober for 2-3 months   Drug use: Not Currently    Types: "Crack" cocaine, Cocaine    Comment: last use 07/26/13   Sexual activity: Never

## 2022-07-14 LAB — HEPATITIS B SURFACE ANTIGEN: HEPATITIS B SURFACE ANTIGEN: NONREACTIVE

## 2022-07-14 LAB — HEPATITIS B CORE ANTIBODY, TOTAL: HEPATITIS B CORE TOTAL ANTIBODY: NONREACTIVE

## 2022-07-14 LAB — HEPATITIS B SURFACE ANTIBODY
HEPATITIS B SURFACE ANTIBODY QUANT: 88.44 m[IU]/mL — ABNORMAL HIGH (ref <8.00)
HEPATITIS B SURFACE ANTIBODY: REACTIVE — AB

## 2022-07-16 LAB — QUANTIFERON TB GOLD PLUS
QUANTIFERON ANTIGEN 1 MINUS NIL: 0.01 [IU]/mL
QUANTIFERON ANTIGEN 2 MINUS NIL: 0.01 [IU]/mL
QUANTIFERON MITOGEN: 9.97 [IU]/mL
QUANTIFERON TB GOLD PLUS: NEGATIVE
QUANTIFERON TB NIL VALUE: 0.03 [IU]/mL

## 2022-07-16 LAB — TB AG1: TB AG1 VALUE: 0.04

## 2022-07-16 LAB — TB MITOGEN: TB MITOGEN VALUE: 10

## 2022-07-16 LAB — TB NIL: TB NIL VALUE: 0.03

## 2022-07-16 LAB — TB AG2: TB AG2 VALUE: 0.04

## 2022-07-16 MED FILL — METHOTREXATE SODIUM (CONTAINS PRESERVATIVES) 25 MG/ML INJECTION SOLUTION: SUBCUTANEOUS | 28 days supply | Qty: 4 | Fill #0

## 2022-07-24 NOTE — Unmapped (Signed)
Transitioning to methotrexate spec-lite queue at [facility].

## 2022-07-25 NOTE — Unmapped (Signed)
Repeat LFT order sent to [facility]

## 2022-08-10 LAB — ALT: ALT (SGPT): 49 IU/L — ABNORMAL HIGH (ref 0–44)

## 2022-08-10 LAB — AST: AST (SGOT): 26 IU/L (ref 0–40)

## 2022-08-12 NOTE — Unmapped (Signed)
[facility] Refill Coordination Note    Specialty Lite Medication(s) to be Shipped:   Inflammatory Disorders: methotrexate (injectable)    Other medication(s) to be shipped: No additional medications requested for fill at this time     [name], DOB: 10-17-1970  Phone: [phone] (home)       All above HIPAA information was verified with patient.     Was a Nurse, learning disability used for this call? No    Changes to medications: [name] reports no changes at this time.  Changes to insurance: No      REFERRAL TO PHARMACIST     Referral to the pharmacist: Not needed      [facility]     Shipping address confirmed in Epic.     Delivery Scheduled: Yes, Expected medication delivery date: 7/3.     Medication will be delivered via UPS to the prescription address in Epic WAM.    Gaspar Cola Shared [facility] Specialty Technician

## 2022-08-13 MED FILL — METHOTREXATE SODIUM (CONTAINS PRESERVATIVES) 25 MG/ML INJECTION SOLUTION: SUBCUTANEOUS | 28 days supply | Qty: 4 | Fill #1

## 2022-08-27 ENCOUNTER — Ambulatory Visit (INDEPENDENT_AMBULATORY_CARE_PROVIDER_SITE_OTHER): Payer: MEDICAID | Admitting: Physical Medicine and Rehabilitation

## 2022-08-27 ENCOUNTER — Encounter: Payer: Self-pay | Admitting: Physical Medicine and Rehabilitation

## 2022-08-27 DIAGNOSIS — Z981 Arthrodesis status: Secondary | ICD-10-CM

## 2022-08-27 DIAGNOSIS — R29898 Other symptoms and signs involving the musculoskeletal system: Secondary | ICD-10-CM

## 2022-08-27 DIAGNOSIS — M79602 Pain in left arm: Secondary | ICD-10-CM

## 2022-08-27 DIAGNOSIS — M25511 Pain in right shoulder: Secondary | ICD-10-CM

## 2022-08-27 DIAGNOSIS — R202 Paresthesia of skin: Secondary | ICD-10-CM | POA: Diagnosis not present

## 2022-08-27 DIAGNOSIS — G8929 Other chronic pain: Secondary | ICD-10-CM

## 2022-08-27 DIAGNOSIS — G894 Chronic pain syndrome: Secondary | ICD-10-CM

## 2022-08-27 NOTE — Progress Notes (Unsigned)
Pt is having numbness in pinky and ring finger for since Aug of last year since Cervical spine surgery at [facility] [place]   History of 2 prior carpal tunnel releases on the left and 1 on the right.  These are done between 2017 and 2018. First surgery did not seem to help very much and had second surgery about 2 years after the first and since then has had really no issues with median nerve symptoms.  Has had prior cervical surgery at [facility] with second surgery after a fall that occurred last year.  Has had the tingling sensations in the hands since the fall.  This has been essentially a year ago. S/p C5-7 ACDF (03/31/17) XR-C 06/2021 - Worsening screw protrusion. CT-C 07/2021 - C6-7 fusion status indeterminate.  MR-C 07/2021 - Mild C3-5 disc bulges. Left C7-T1 foram stenosis. S/p anterior screw removal (B.Sullivan) (09/12/21) S/p C5-7 PCF, L.C7-T1 foraminotomy (10/04/21)

## 2022-08-29 NOTE — Procedures (Unsigned)
EMG & NCV Findings: Evaluation of the left median (across palm) sensory nerve showed prolonged distal peak latency (Wrist, 3.8 ms) and prolonged distal peak latency (Palm, 2.2 ms).  All remaining nerves (as indicated in the following tables) were within normal limits.    All examined muscles (as indicated in the following table) showed no evidence of electrical instability.    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  Really no way to tell if this is residual or recurrent after prior carpal tunnel release x 2 on the left.  Clinically this does not match his symptoms.  There is no significant electrodiagnostic evidence of any other focal nerve entrapment (specifically ulnar), brachial plexopathy or cervical radiculopathy.  **As you know, this particular electrodiagnostic study cannot rule out chemical radiculitis or sensory only radiculopathy.  Recommendations: 1.  Follow-up with referring physician.  Clinical correlation of pain is paramount with this patient.  The paresthesias are likely due to prior mild nerve injury due to spine surgery and/or the pathological anatomy at the time.  There is no EMG findings showing acute radiculopathy.  He did have foraminotomies at C7-T1 on the left. 2.  Continue current management of symptoms.  ___________________________ Naaman Plummer FAAPMR Board Certified, American Board of Physical Medicine and Rehabilitation    Nerve Conduction Studies Anti Sensory Summary Table   Stim Site NR Peak (ms) Norm Peak (ms) P-T Amp (V) Norm P-T Amp Site1 Site2 Delta-P (ms) Dist (cm) Vel (m/s) Norm Vel (m/s)  Left Median Acr Palm Anti Sensory (2nd Digit)  31.9C  Wrist    *3.8 <3.6 14.6 >10 Wrist Palm 1.6 0.0    Palm    *2.2 <2.0 3.9         Left Radial Anti Sensory (Base 1st Digit)  31.4C  Wrist    1.9 <3.1 27.0  Wrist Base 1st  Digit 1.9 0.0    Left Ulnar Anti Sensory (5th Digit)  32.3C  Wrist    3.1 <3.7 19.7 >15.0 Wrist 5th Digit 3.1 14.0 45 >38   Motor Summary Table   Stim Site NR Onset (ms) Norm Onset (ms) O-P Amp (mV) Norm O-P Amp Site1 Site2 Delta-0 (ms) Dist (cm) Vel (m/s) Norm Vel (m/s)  Left Median Motor (Abd Poll Brev)  32.1C  Wrist    4.1 <4.2 5.5 >5 Elbow Wrist 4.1 20.5 50 >50  Elbow    8.2  5.4         Left Ulnar Motor (Abd Dig Min)  32.5C  Wrist    3.0 <4.2 12.7 >3 B Elbow Wrist 3.4 20.0 59 >53  B Elbow    6.4  12.5  A Elbow B Elbow 1.2 10.0 83 >53  A Elbow    7.6  12.3          EMG   Side Muscle Nerve Root Ins Act Fibs Psw Amp Dur Poly Recrt Int Dennie Bible Comment  Left Abd Poll Brev Median C8-T1 Nml Nml Nml Nml Nml 0 Nml Nml   Left 1stDorInt Ulnar C8-T1 Nml Nml Nml Nml Nml 0 Nml Nml   Left PronatorTeres Median C6-7 Nml Nml Nml Nml Nml 0 Nml Nml   Left Biceps Musculocut C5-6 Nml Nml Nml Nml Nml 0 Nml Nml   Left Deltoid Axillary C5-6 Nml Nml Nml Nml Nml 0 Nml Nml     Nerve Conduction Studies Anti Sensory Left/Right Comparison  Stim Site L Lat (ms) R Lat (ms) L-R Lat (ms) L Amp (V) R Amp (V) L-R Amp (%) Site1 Site2 L Vel (m/s) R Vel (m/s) L-R Vel (m/s)  Median Acr Palm Anti Sensory (2nd Digit)  31.9C  Wrist *3.8   14.6   Wrist Palm     Palm *2.2   3.9         Radial Anti Sensory (Base 1st Digit)  31.4C  Wrist 1.9   27.0   Wrist Base 1st Digit     Ulnar Anti Sensory (5th Digit)  32.3C  Wrist 3.1   19.7   Wrist 5th Digit 45     Motor Left/Right Comparison   Stim Site L Lat (ms) R Lat (ms) L-R Lat (ms) L Amp (mV) R Amp (mV) L-R Amp (%) Site1 Site2 L Vel (m/s) R Vel (m/s) L-R Vel (m/s)  Median Motor (Abd Poll Brev)  32.1C  Wrist 4.1   5.5   Elbow Wrist 50    Elbow 8.2   5.4         Ulnar Motor (Abd Dig Min)  32.5C  Wrist 3.0   12.7   B Elbow Wrist 59    B Elbow 6.4   12.5   A Elbow B Elbow 83    A Elbow 7.6   12.3            Waveforms:

## 2022-09-04 ENCOUNTER — Ambulatory Visit (INDEPENDENT_AMBULATORY_CARE_PROVIDER_SITE_OTHER): Payer: MEDICAID | Admitting: Student

## 2022-09-04 VITALS — BP 132/88 | HR 72 | Ht 68.0 in | Wt 251.2 lb

## 2022-09-04 DIAGNOSIS — R600 Localized edema: Secondary | ICD-10-CM

## 2022-09-04 NOTE — Patient Instructions (Signed)
It was great to see you! Thank you for allowing me to participate in your care!   I recommend that you always bring your medications to each appointment as this makes it easy to ensure we are on the correct medications and helps [place] not miss when refills are needed.  Our plans for today:  - Use compression socks at work - Raise legs to help with swelling  We are checking some labs today, I will call you if they are abnormal will send you a MyChart message or a letter if they are normal.  If you do not hear about your labs in the next 2 weeks please let us know.  Take care and seek immediate care sooner if you develop any concerns. Please remember to show up 15 minutes before your scheduled appointment time!  Tiffany Kocher, DO [facility]

## 2022-09-04 NOTE — Progress Notes (Signed)
Blake Arnold - 52 y.o. male MRN [id number]  Date of birth: 07/08/1970  Office Visit Note: Visit Date: 08/27/2022 PCP: Elberta Fortis, MD Referred by: Julieanne Cotton, PA-C  Subjective: Chief Complaint  Patient presents with   Left Ring Finger - Numbness   Left Little Finger - Numbness   HPI:  Blake Arnold is a 52 y.o. male who comes in today at the request of Blake Cai, PA-C for evaluation and management of chronic, worsening and severe pain, numbness and tingling in the Left upper extremities.  Patient is Right hand dominant.  He presents with a rather complicated course of chronic pain in multiple joint and limb pain.  Brief history from the patient and the chart show that he has been seeing Dr. August Saucer and Blake Cai, PA-C mainly for his right shoulder for which she did receive diagnostic relief with [facility] joint injection and shoulder injection.  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  In the midst of them seeing him he commented on continued pain numbness tingling and weakness in the left hand and arm particular from the elbow down to the ulnar side of the hand.  He has been in treatment with [facility], Sula Soda, MD.  He reports this paresthesia and dysesthesia in the left hand since August of last year.  Further complicated history is that he had 2 prior carpal tunnel releases on the left and 1 on the right.  Actually saw Dr. Glee Arvin in the office for the left hand sometime ago but was referred to hand surgeon who completed the left repeat carpal tunnel surgery.  First carpal tunnel surgery did not seem to help this was around 2017 and then in 2018 the surgery by the hand surgeon did seem to relieve his median nerve symptoms.  He had a prior C5-7 ACDF at [facility] in 2019.  Then after a fall 2023 he began having symptoms particularly into the left hand and into the ulnar digits and these are well-documented through the notes.  X-ray was obtained  showing screw protrusion in May of last year.  CT scan in June unable to confirm fusion at C6-7 and also MRI at that time showed a left C7-T1 foraminal stenosis.  It was decided at that time given his symptoms to complete surgery in August of last year where they did remove the affected screw as well as performed a foraminotomy on the left at C7-T1.  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   I spent more than 30 minutes speaking face-to-face with the patient with 50% of the time in counseling and discussing coordination of care.    Review of Systems  Musculoskeletal:  Positive for back pain, joint pain and neck pain.  Neurological:  Positive for tingling and weakness.  All other systems reviewed and are negative.  Otherwise per HPI.  Assessment & Plan: Visit Diagnoses:    ICD-10-CM   1. Paresthesia of skin  R20.2 NCV with EMG (electromyography)    2. Left arm pain  M79.602     3. Left hand weakness  R29.898     4. S/P cervical spinal fusion  Z98.1     5. Chronic right shoulder pain  M25.511    G89.29     6. Chronic pain syndrome  G89.4       Plan: Impression: Complicated differential  with symptoms pretty classic for ulnar nerve distribution but could be C8 nerve distribution.  Complicated history of prior carpal tunnel release and cervical spine surgery.  History of chronic pain syndrome and multiple limb and joint pain.  Electrodiagnostic study performed.  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  Really no way to tell if this is residual or recurrent after prior carpal tunnel release x 2 on the left.  Clinically this does not match his symptoms.  There is no significant electrodiagnostic evidence of any other focal nerve  entrapment (specifically ulnar), brachial plexopathy or cervical radiculopathy.  **As you know, this particular electrodiagnostic study cannot rule out chemical radiculitis or sensory only radiculopathy.  Recommendations: 1.  Follow-up with referring physician.  Clinical correlation of pain is paramount with this patient.  The paresthesias are likely due to prior mild nerve injury due to spine surgery and/or the pathological anatomy at the time.  There is no EMG findings showing acute radiculopathy.  He did have foraminotomies at C7-T1 on the left. 2.  Continue current management of symptoms.  Meds & Orders: No orders of the defined types were placed in this encounter.   Orders Placed This Encounter  Procedures   NCV with EMG (electromyography)    Follow-up: Return for [employer], PA-C.   Procedures: No procedures performed  EMG & NCV Findings: Evaluation of the left median (across palm) sensory nerve showed prolonged distal peak latency (Wrist, 3.8 ms) and prolonged distal peak latency (Palm, 2.2 ms).  All remaining nerves (as indicated in the following tables) were within normal limits.    All examined muscles (as indicated in the following table) showed no evidence of electrical instability.    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  Really no way to tell if this is residual or recurrent after prior carpal tunnel release x 2 on the left.  Clinically this does not match his symptoms.  There is no significant electrodiagnostic evidence of any other focal nerve entrapment (specifically ulnar), brachial plexopathy or cervical radiculopathy.  **As you know, this particular electrodiagnostic study cannot rule out chemical radiculitis or sensory only radiculopathy.  Recommendations: 1.  Follow-up with referring physician.  Clinical correlation of  pain is paramount with this patient.  The paresthesias are likely due to prior mild nerve injury due to spine surgery and/or the pathological anatomy at the time.  There is no EMG findings showing acute radiculopathy.  He did have foraminotomies at C7-T1 on the left. 2.  Continue current management of symptoms.  ___________________________ Naaman Plummer FAAPMR Board Certified, American Board of Physical Medicine and Rehabilitation    Nerve Conduction Studies Anti Sensory Summary Table   Stim Site NR Peak (ms) Norm Peak (ms) P-T Amp (V) Norm P-T Amp Site1 Site2 Delta-P (ms) Dist (cm) Vel (m/s) Norm Vel (m/s)  Left Median Acr Palm Anti Sensory (2nd Digit)  31.9C  Wrist    *3.8 <3.6 14.6 >10 Wrist Palm 1.6 0.0    Palm    *2.2 <2.0 3.9         Left Radial Anti Sensory (Base 1st Digit)  31.4C  Wrist    1.9 <3.1 27.0  Wrist Base 1st Digit 1.9 0.0    Left Ulnar Anti Sensory (5th Digit)  32.3C  Wrist    3.1 <3.7 19.7 >15.0 Wrist 5th Digit 3.1 14.0 45 >38   Motor Summary Table   Stim Site NR Onset (ms) Norm Onset (ms) O-P Amp (mV) Norm O-P Amp Site1 Site2 Delta-0 (ms) Dist (cm) Vel (m/s) Norm Vel (m/s)  Left Median Motor (Abd Poll Brev)  32.1C  Wrist    4.1 <4.2 5.5 >5 Elbow Wrist 4.1 20.5 50 >50  Elbow    8.2  5.4         Left Ulnar Motor (Abd Dig Min)  32.5C  Wrist    3.0 <4.2 12.7 >3 B Elbow Wrist 3.4 20.0 59 >53  B Elbow    6.4  12.5  A Elbow B Elbow 1.2 10.0 83 >53  A Elbow    7.6  12.3          EMG   Side Muscle Nerve Root Ins Act Fibs Psw Amp Dur Poly Recrt Int Dennie Bible Comment  Left Abd Poll Brev Median C8-T1 Nml Nml Nml Nml Nml 0 Nml Nml   Left 1stDorInt Ulnar C8-T1 Nml Nml Nml Nml Nml 0 Nml Nml   Left PronatorTeres Median C6-7 Nml Nml Nml Nml Nml 0 Nml Nml   Left Biceps Musculocut C5-6 Nml Nml Nml Nml Nml 0 Nml Nml   Left Deltoid Axillary C5-6 Nml Nml Nml Nml Nml 0 Nml Nml     Nerve Conduction Studies Anti Sensory Left/Right Comparison   Stim Site L Lat (ms) R Lat (ms)  L-R Lat (ms) L Amp (V) R Amp (V) L-R Amp (%) Site1 Site2 L Vel (m/s) R Vel (m/s) L-R Vel (m/s)  Median Acr Palm Anti Sensory (2nd Digit)  31.9C  Wrist *3.8   14.6   Wrist Palm     Palm *2.2   3.9         Radial Anti Sensory (Base 1st Digit)  31.4C  Wrist 1.9   27.0   Wrist Base 1st Digit     Ulnar Anti Sensory (5th Digit)  32.3C  Wrist 3.1   19.7   Wrist 5th Digit 45     Motor Left/Right Comparison   Stim Site L Lat (ms) R Lat (ms) L-R Lat (ms) L Amp (mV) R Amp (mV) L-R Amp (%) Site1 Site2 L Vel (m/s) R Vel (m/s) L-R Vel (m/s)  Median Motor (Abd Poll Brev)  32.1C  Wrist 4.1   5.5   Elbow Wrist 50    Elbow 8.2   5.4         Ulnar Motor (Abd Dig Min)  32.5C  Wrist 3.0   12.7   B Elbow Wrist 59    B Elbow 6.4   12.5   A Elbow B Elbow 83    A Elbow 7.6   12.3            Waveforms:             Clinical History: IMPRESSION: Status post C5-C7 ACDF with similar appearance of known fracture and backing out of the left C7 screw better demonstrated on recent CT.  Multilevel degenerative changes most notable at C3-C4 and C4-C5 where there is mild spinal canal narrowing. Moderate to severe C7-T1 left foraminal narrowing. There is also mild to moderate multilevel neural foraminal narrowing as above. Degenerative changes are slightly progressed since 2019.  Exam End: 07/23/21 17:13     Objective:  VS:  HT:    WT:   BMI:     BP:  HR: bpm  TEMP: ( )  RESP:  Physical Exam Vitals and nursing note reviewed.  Constitutional:      General: He is not in acute distress.    Appearance: Normal appearance. He is well-developed.  HENT:     Head: Normocephalic and atraumatic.  Eyes:     Conjunctiva/sclera: Conjunctivae normal.     Pupils: Pupils are equal, round, and reactive to light.  Cardiovascular:     Rate and Rhythm: Normal rate.     Pulses: Normal pulses.     Heart sounds: Normal heart sounds.  Pulmonary:     Effort: Pulmonary effort is normal. No respiratory distress.   Musculoskeletal:        General: No tenderness.     Cervical back: Normal range of motion and neck supple. No rigidity.     Right lower leg: No edema.     Left lower leg: No edema.     Comments: Inspection reveals well-healed bilateral carpal tunnel release scars but no atrophy of the bilateral APB or FDI or hand intrinsics. There is no swelling, color changes, allodynia or dystrophic changes. There is 5 out of 5 strength in the bilateral wrist extension, finger abduction and long finger flexion.  There is impaired sensation with dysesthesia in the left fifth digit.  Otherwise sensation intact.. There is a negative Froment's test bilaterally. There is a negative Tinel's test at the bilateral wrist and elbow. There is a negative Phalen's test bilaterally. There is a negative Hoffmann's test bilaterally.  Skin:    General: Skin is warm and dry.     Findings: No erythema or rash.  Neurological:     General: No focal deficit present.     Mental Status: He is alert and oriented to person, place, and time.     Sensory: No sensory deficit.     Motor: No weakness or abnormal muscle tone.     Coordination: Coordination normal.     Gait: Gait normal.  Psychiatric:        Mood and Affect: Mood normal.        Behavior: Behavior normal.        Thought Content: Thought content normal.      Imaging: No results found.

## 2022-09-04 NOTE — Progress Notes (Signed)
    SUBJECTIVE:   CHIEF COMPLAINT / HPI:   Left Leg swelling Patient endorses left leg swelling, however it is bilateral.  No recent trauma.  Swelling started few days ago, and got worse yesterday.  He notes history of bilateral leg swelling, particularly with his rheumatoid arthritis-however he was concerned because left leg was painful after soak in Epsom salt bath.  Skin is tender to touch, but not at rest.  History of varicose veins.  Additionally endorsing some exertional dyspnea at his job, but no chest pain.  He is having intermittent palpitations, with history of atrial fibrillation-on metoprolol 25 mg daily.  Denies fevers, chills, NVD.  PERTINENT  PMH / PSH: A-fib, hypertension, tobacco use, chronic pain, OSA  OBJECTIVE:   BP 132/88   Pulse 72   Ht 5\' 8"  (1.727 m)   Wt 251 lb 3.2 oz (113.9 kg)   SpO2 96%   BMI 38.19 kg/m    General: NAD, pleasant Cardio: RRR, no MRG Respiratory: CTAB, normal wob on RA Skin: Warm and dry Extremities: BL +1 pitting edema, dermatitis present. Varicose veins present. Dorsal Pedal pulses present BL.       ASSESSMENT/PLAN:   Bilateral lower extremity edema Primarily suspect venous stasis.  Dermatitis likely related to venous stasis.  Due to bilateral nature, lower suspicion for DVT.  Started back on methotrexate in late May. Will obtain CMP/BMP to assess renal/liver/cardiac functions.  Consider echocardiogram, pending BNP. - Compression socks, raise legs - BNP, CMP  Tiffany Kocher, DO [facility]

## 2022-09-05 LAB — COMPREHENSIVE METABOLIC PANEL
ALT: 34 IU/L (ref 0–44)
AST: 23 IU/L (ref 0–40)
Albumin: 4.2 g/dL (ref 3.8–4.9)
Alkaline Phosphatase: 69 IU/L (ref 44–121)
BUN: 9 mg/dL (ref 6–24)
Bilirubin Total: 0.4 mg/dL (ref 0.0–1.2)
CO2: 20 mmol/L (ref 20–29)
Creatinine, Ser: 0.69 mg/dL — ABNORMAL LOW (ref 0.76–1.27)
Globulin, Total: 3.1 g/dL (ref 1.5–4.5)
Sodium: 138 mmol/L (ref 134–144)
Total Protein: 7.3 g/dL (ref 6.0–8.5)
eGFR: 111 mL/min/{1.73_m2} (ref >59)

## 2022-09-05 LAB — BRAIN NATRIURETIC PEPTIDE

## 2022-09-06 ENCOUNTER — Telehealth: Payer: Self-pay | Admitting: Surgical

## 2022-09-06 ENCOUNTER — Ambulatory Visit: Payer: Medicaid Other | Admitting: Surgical

## 2022-09-07 ENCOUNTER — Emergency Department (HOSPITAL_COMMUNITY)
Admission: EM | Admit: 2022-09-07 | Discharge: 2022-09-08 | Disposition: A | Discharge status: Home / Self Care | Payer: MEDICAID | Attending: Emergency Medicine | Admitting: Emergency Medicine

## 2022-09-07 ENCOUNTER — Encounter (HOSPITAL_COMMUNITY): Payer: Self-pay | Admitting: Emergency Medicine

## 2022-09-07 ENCOUNTER — Other Ambulatory Visit: Payer: Self-pay

## 2022-09-07 DIAGNOSIS — D72829 Elevated white blood cell count, unspecified: Secondary | ICD-10-CM | POA: Diagnosis not present

## 2022-09-07 DIAGNOSIS — R109 Unspecified abdominal pain: Secondary | ICD-10-CM | POA: Diagnosis present

## 2022-09-07 DIAGNOSIS — K529 Noninfective gastroenteritis and colitis, unspecified: Secondary | ICD-10-CM

## 2022-09-07 DIAGNOSIS — I7143 Infrarenal abdominal aortic aneurysm, without rupture: Secondary | ICD-10-CM | POA: Insufficient documentation

## 2022-09-07 LAB — URINALYSIS, ROUTINE W REFLEX MICROSCOPIC
Bilirubin Urine: NEGATIVE
Glucose, UA: NEGATIVE mg/dL
Hgb urine dipstick: NEGATIVE
Ketones, ur: NEGATIVE mg/dL
Leukocytes,Ua: NEGATIVE
Nitrite: NEGATIVE
Protein, ur: NEGATIVE mg/dL
Specific Gravity, Urine: 1.021 (ref 1.005–1.030)
pH: 6 (ref 5.0–8.0)

## 2022-09-07 LAB — CBC
HCT: 49.7 % (ref 39.0–52.0)
Hemoglobin: 17.2 g/dL — ABNORMAL HIGH (ref 13.0–17.0)
MCH: 33.1 pg (ref 26.0–34.0)
MCHC: 34.6 g/dL (ref 30.0–36.0)
MCV: 95.8 fL (ref 80.0–100.0)
Platelets: 292 10*3/uL (ref 150–400)
RBC: 5.19 MIL/uL (ref 4.22–5.81)
RDW: 12.7 % (ref 11.5–15.5)
WBC: 14.1 10*3/uL — ABNORMAL HIGH (ref 4.0–10.5)
nRBC: 0 % (ref 0.0–0.2)

## 2022-09-07 LAB — COMPREHENSIVE METABOLIC PANEL WITH GFR
ALT: 37 U/L (ref 0–44)
AST: 25 U/L (ref 15–41)
Albumin: 4 g/dL (ref 3.5–5.0)
Alkaline Phosphatase: 61 U/L (ref 38–126)
Anion gap: 16 — ABNORMAL HIGH (ref 5–15)
BUN: 12 mg/dL (ref 6–20)
CO2: 24 mmol/L (ref 22–32)
Calcium: 9.7 mg/dL (ref 8.9–10.3)
Chloride: 96 mmol/L — ABNORMAL LOW (ref 98–111)
Creatinine, Ser: 1 mg/dL (ref 0.61–1.24)
GFR, Estimated: >60 mL/min (ref >60)
Glucose, Bld: 135 mg/dL — ABNORMAL HIGH (ref 70–99)
Potassium: 4.3 mmol/L (ref 3.5–5.1)
Sodium: 136 mmol/L (ref 135–145)
Total Bilirubin: 1 mg/dL (ref 0.3–1.2)
Total Protein: 7.6 g/dL (ref 6.5–8.1)

## 2022-09-07 LAB — LIPASE, BLOOD: Lipase: 39 U/L (ref 11–51)

## 2022-09-07 NOTE — ED Triage Notes (Signed)
Pt c/o abdominal pain with N/V since 1500 today.

## 2022-09-08 ENCOUNTER — Emergency Department (HOSPITAL_COMMUNITY): Payer: MEDICAID

## 2022-09-08 MED ORDER — LACTATED RINGERS IV BOLUS
1000.0000 mL | Freq: Once | INTRAVENOUS | Status: AC
  Administered 2022-09-08: 1000 mL via INTRAVENOUS

## 2022-09-08 MED ORDER — ONDANSETRON 4 MG PO TBDP: 4.0000 mg | ORAL_TABLET | Freq: Three times a day (TID) | ORAL | 0 refills | Status: DC | PRN

## 2022-09-08 MED ORDER — ONDANSETRON HCL 4 MG/2ML IJ SOLN
4.0000 mg | Freq: Once | INTRAMUSCULAR | Status: AC
  Administered 2022-09-08: 4 mg via INTRAVENOUS
  Filled 2022-09-08: qty 2

## 2022-09-08 MED ORDER — CIPROFLOXACIN HCL 500 MG PO TABS
500.0000 mg | ORAL_TABLET | Freq: Once | ORAL | Status: AC
  Administered 2022-09-08: 500 mg via ORAL
  Filled 2022-09-08: qty 1

## 2022-09-08 MED ORDER — HYDROMORPHONE HCL 1 MG/ML IJ SOLN
0.5000 mg | Freq: Once | INTRAMUSCULAR | Status: AC | PRN
  Administered 2022-09-08: 0.5 mg via INTRAVENOUS
  Filled 2022-09-08: qty 1

## 2022-09-08 MED ORDER — CIPROFLOXACIN HCL 500 MG PO TABS: 500.0000 mg | ORAL_TABLET | Freq: Two times a day (BID) | ORAL | 0 refills | Status: DC

## 2022-09-08 MED ORDER — IOHEXOL 350 MG/ML SOLN
75.0000 mL | Freq: Once | INTRAVENOUS | Status: AC | PRN
  Administered 2022-09-08: 75 mL via INTRAVENOUS

## 2022-09-08 MED ORDER — OXYCODONE-ACETAMINOPHEN 5-325 MG PO TABS: 1.0000 | ORAL_TABLET | Freq: Four times a day (QID) | ORAL | 0 refills | Status: DC | PRN

## 2022-09-08 NOTE — Discharge Instructions (Addendum)
Your CT scan showed enteritis, which can be inflammatory of infectious.  Due to your pain and elevated white blood cell count, we will treat this like infectious enteritis with antibiotics.  Your symptoms should improve in the next day or two with treatment.  Your CT also showed an abdominal aortic aneurysm.  You'll need to discuss this with your doctor.  It will likely need monitoring by way of ultrasound every few years.

## 2022-09-08 NOTE — ED Provider Notes (Signed)
[facility] Provider Note MRN:  [id number]  Arrival date & time: 09/08/22     Chief Complaint   Abdominal Pain   History of Present Illness   Blake Arnold is a 52 y.o. year-old male presents to the ED with chief complaint of abdominal pain and vomiting.  Onset was 3pm yesterday.  Reports severe, sharp, abdominal pain.  Denies measuring a fever, but he states he felt like he had one.  Denies any changes in bowel movements.  Denies any dysuria.  Hx of appendectomy.  Takes methotrexate for RA.  Not on any steroids.  History provided by patient.   Review of Systems  Pertinent positive and negative review of systems noted in HPI.    Physical Exam   Vitals:   09/08/22 0300 09/08/22 0452  BP: 127/76 131/87  Pulse: 69 70  Resp: 20 11  Temp:    SpO2: 95% 97%    CONSTITUTIONAL:  non toxic-appearing, NAD NEURO:  Alert and oriented x 3, CN 3-12 grossly intact EYES:  eyes equal and reactive ENT/NECK:  Supple, no stridor  CARDIO:  normal rate, regular rhythm, appears well-perfused  PULM:  No respiratory distress, CTAB GI/GU:  non-distended, generalized abdominal discomfort, but without focal tenderness or guarding MSK/SPINE:  No gross deformities, no edema, moves all extremities  SKIN:  no rash, atraumatic   *Additional and/or pertinent findings included in MDM below  Diagnostic and Interventional Summary    EKG Interpretation Date/Time:    Ventricular Rate:    PR Interval:    QRS Duration:    QT Interval:    QTC Calculation:   R Axis:      Text Interpretation:         Labs Reviewed  COMPREHENSIVE METABOLIC PANEL - Abnormal; Notable for the following components:      Result Value   Chloride 96 (*)    Glucose, Bld 135 (*)    Anion gap 16 (*)    All other components within normal limits  CBC - Abnormal; Notable for the following components:   WBC 14.1 (*)    Hemoglobin 17.2 (*)    All other components within normal  limits  LIPASE, BLOOD  URINALYSIS, ROUTINE W REFLEX MICROSCOPIC    CT ABDOMEN PELVIS W CONTRAST  Final Result      Medications  ciprofloxacin (CIPRO) tablet 500 mg (has no administration in time range)  HYDROmorphone (DILAUDID) injection 0.5 mg (0.5 mg Intravenous Given 09/08/22 0239)  ondansetron (ZOFRAN) injection 4 mg (4 mg Intravenous Given 09/08/22 0239)  lactated ringers bolus 1,000 mL (0 mLs Intravenous Stopped 09/08/22 0450)  iohexol (OMNIPAQUE) 350 MG/ML injection 75 mL (75 mLs Intravenous Contrast Given 09/08/22 0339)  HYDROmorphone (DILAUDID) injection 0.5 mg (0.5 mg Intravenous Given 09/08/22 0451)     Procedures  /  Critical Care Procedures  ED Course and Medical Decision Making  I have reviewed the triage vital signs, the nursing notes, and pertinent available records from the EMR.  Social Determinants Affecting Complexity of Care: Patient has no clinically significant social determinants affecting this chief complaint..   ED Course:    Medical Decision Making Patient here with abdominal pain and vomiting.  No diarrhea or fever.   Hx of appendectomy.   Moderate leukocytosis.  Intermittent severe pain.  Will check CT.  DDx: SBO, diverticulitis, gastritis, pancreatitis  Amount and/or Complexity of Data Reviewed Labs: ordered.    Details: Normal lipase, doubt pancreatitis No significant LFT abnormality, doubt  cholecystitis Moderate leukocytosis Radiology: ordered.    Details: CT shows enteritis and incident AAA.  I discussed the findings with the patient and the recommend follow-up with PCP.  Risk Prescription drug management.         Consultants: No consultations were needed in caring for this patient.   Treatment and Plan: I considered admission due to patient's initial presentation, but after considering the examination and diagnostic results, patient will not require admission and can be discharged with outpatient follow-up.    Final Clinical  Impressions(s) / ED Diagnoses     ICD-10-CM   1. Enteritis  K52.9     2. Infrarenal abdominal aortic aneurysm (AAA) without rupture (HCC)  I71.43       ED Discharge Orders          Ordered    oxyCODONE-acetaminophen (PERCOCET) 5-325 MG tablet  Every 6 hours PRN,   Status:  Discontinued        09/08/22 0515    ondansetron (ZOFRAN-ODT) 4 MG disintegrating tablet  Every 8 hours PRN        09/08/22 0515    ciprofloxacin (CIPRO) 500 MG tablet  Every 12 hours        09/08/22 0515    oxyCODONE-acetaminophen (PERCOCET) 5-325 MG tablet  Every 6 hours PRN        09/08/22 0516              Discharge Instructions Discussed with and Provided to Patient:     Discharge Instructions      Your CT scan showed enteritis, which can be inflammatory of infectious.  Due to your pain and elevated white blood cell count, we will treat this like infectious enteritis with antibiotics.  Your symptoms should improve in the next day or two with treatment.  Your CT also showed an abdominal aortic aneurysm.  You'll need to discuss this with your doctor.  It will likely need monitoring by way of ultrasound every few years.       Roxy Horseman, PA-C 09/08/22 1610    Gilda Crease, MD 09/08/22 0630

## 2022-09-10 NOTE — Unmapped (Signed)
[facility] has been contacted in regards to their refill of methotrexate (CONTAINS PRESERVATIVES) 25 mg/mL injection solution. At this time, they have declined refill due to  wanting to speak with dr about possible side effects . Refill assessment call date has been updated per the patient's request.

## 2022-09-11 NOTE — Progress Notes (Signed)
    SUBJECTIVE:   CHIEF COMPLAINT / HPI:   Aortic aneurysm Found incidentally on CT during ER visit on 7/28. 3.4 cm infrarenal abdominal aortic aneurysm. Recommend follow-up ultrasound every 3 years. Continues to smoke but states he is trying to cut back. Usually smokes 1 pack/day since the age of 34. Interested in help with quitting.  ED follow up - enteritis At the same ED visit, patient diagnosed with enteritis seen on CT. Mild leukocytosis and was given Cipro x 10 day course. States his N/ has resolved and having normal BM. Still having mostly epigastric pain now. Pain worse with drinking cokes or acidic things. Had GERD in the past and took Famotidine that worked really well.  Carpel tunnel syndrome - L hand Tingling in 4th and 5th digits worsening over the past few weeks. Has a brace but has not been wearing it. Denies weakness in his L hand. Sees Pain Management and states he was going to bring it up at his next visit.  PERTINENT  PMH / PSH: HTN, CTS, tobacco use, DJD, h/o alcohol use disorder  OBJECTIVE:   BP 137/82   Pulse (!) 103   Ht 5\' 8"  (1.727 m)   Wt 248 lb (112.5 kg)   SpO2 96%   BMI 37.71 kg/m    General: NAD, pleasant, able to participate in exam Cardiac: RRR, no murmurs. Respiratory: CTAB, normal effort, No wheezes, rales or rhonchi Abdomen: Soft, obese abdomen, tender over epigastric region Extremities: no edema or cyanosis. Skin: warm and dry, no rashes noted Neuro: alert, no obvious focal deficits Psych: Normal affect and mood MSK: Positive Tinel sign on L hand with tingling in 4th and 5th digit. 5/5 grip strength. No muscle atrophy of L hand noted.  ASSESSMENT/PLAN:   Abdominal aortic aneurysm (AAA) 30 to 34 mm in diameter [facility]) Incidental finding on CT, 34 mm infrarenal aortic aneurysm. Asymptomatic.  -Recommend follow up surveillance [place] in 3 year (in 2027) -Encouraged smoking cessation as below  TOBACCO ABUSE Current smoker, 40-pack year history.  Motivated to quit and would like assistance. Has NRT options at home already. -Varenicline taper: 0.5mg  daily x 3 days, 0.5mg  BID x 4 days, 1mg  BID x 11 weeks -Low-dose CT chest for lung cancer screening  Carpal tunnel syndrome Flare of CTS in L hand. Not currently wearing brace. Would like to discuss further with pain management but trying to avoid surgery. -Recommended use of wrist brace day and night x 2 weeks then daily after -Consider steroid injection into median nerve if not improving  GERD (gastroesophageal reflux disease) Epigastric pain worse with acidic food and drink consistent with reflux. No red flag symptoms. Previous tried Famotidine with resolution of symptoms. -Famotidine 20mg  daily  Gastroenteritis CT confirmed enteritis in the ED on 7/28. Mild leukocytosis so given Cipro x 10 day course. Symptoms greatly improved, most likely viral in origin. Recommend patient can stop antibiotic course when symptoms resolve given potential tendinopathy side effect.   Dr. Elberta Fortis, DO [facility]

## 2022-09-12 ENCOUNTER — Ambulatory Visit: Payer: MEDICAID | Admitting: Family Medicine

## 2022-09-12 ENCOUNTER — Encounter: Payer: Self-pay | Admitting: Family Medicine

## 2022-09-12 ENCOUNTER — Other Ambulatory Visit: Payer: Self-pay

## 2022-09-12 VITALS — BP 137/82 | HR 103 | Ht 68.0 in | Wt 248.0 lb

## 2022-09-12 DIAGNOSIS — Z122 Encounter for screening for malignant neoplasm of respiratory organs: Secondary | ICD-10-CM

## 2022-09-12 DIAGNOSIS — Z720 Tobacco use: Secondary | ICD-10-CM

## 2022-09-12 DIAGNOSIS — F1910 Other psychoactive substance abuse, uncomplicated: Secondary | ICD-10-CM | POA: Diagnosis not present

## 2022-09-12 DIAGNOSIS — F17200 Nicotine dependence, unspecified, uncomplicated: Secondary | ICD-10-CM

## 2022-09-12 DIAGNOSIS — K219 Gastro-esophageal reflux disease without esophagitis: Secondary | ICD-10-CM | POA: Diagnosis not present

## 2022-09-12 DIAGNOSIS — K529 Noninfective gastroenteritis and colitis, unspecified: Secondary | ICD-10-CM

## 2022-09-12 DIAGNOSIS — I714 Abdominal aortic aneurysm, without rupture, unspecified: Secondary | ICD-10-CM

## 2022-09-12 DIAGNOSIS — G5602 Carpal tunnel syndrome, left upper limb: Secondary | ICD-10-CM

## 2022-09-12 MED ORDER — FAMOTIDINE 20 MG PO TABS
20.0000 mg | ORAL_TABLET | Freq: Every day | ORAL | 2 refills | Status: AC
Start: 2022-09-12 — End: ?

## 2022-09-12 MED ORDER — VARENICLINE TARTRATE 0.5 MG PO TABS
ORAL_TABLET | ORAL | 0 refills | Status: AC
Start: 2022-09-12 — End: 2022-11-28

## 2022-09-12 NOTE — Patient Instructions (Addendum)
It was wonderful to see you today! Thank you for choosing [facility].   Please bring ALL of your medications with you to every visit.   Today we talked about:  For your aortic aneurysm, we can do screening in 3 years to monitor the size. There are no restriction in the mean time but I would recommend to stop smoking. I am prescribing the Chantix to stop smoking. Please take one tablet daily for the next 3 days, than 1 tablet twice per day for the next 4 days and then 2 tablets twice per day for the next 11 weeks. Please take it with food. I ordered the lung cancer screening that our office will call about I prescribed the Famotidine to help with reflux. You can complete the Cipro antibiotic for your bowel inflammation. For the carpel tunnel I would wear your wrist brace day and night for the next 2 weeks and see your pain management doctor.  Please follow up in 1 month for smoking cessation  Call the clinic at [phone] if your symptoms worsen or you have any concerns.  Please be sure to schedule follow up at the front desk before you leave today.   Elberta Fortis, DO Family Medicine

## 2022-09-12 NOTE — Assessment & Plan Note (Signed)
CT confirmed enteritis in the ED on 7/28. Mild leukocytosis so given Cipro x 10 day course. Symptoms greatly improved, most likely viral in origin. Recommend patient can stop antibiotic course when symptoms resolve given potential tendinopathy side effect.

## 2022-09-12 NOTE — Assessment & Plan Note (Addendum)
Incidental finding on CT, 34 mm infrarenal aortic aneurysm. Asymptomatic.  -Recommend follow up surveillance [place] in 3 year (in 2027) -Encouraged smoking cessation as below

## 2022-09-12 NOTE — Assessment & Plan Note (Signed)
Epigastric pain worse with acidic food and drink consistent with reflux. No red flag symptoms. Previous tried Famotidine with resolution of symptoms. -Famotidine 20mg  daily

## 2022-09-12 NOTE — Assessment & Plan Note (Addendum)
Current smoker, 40-pack year history. Motivated to quit and would like assistance. Has NRT options at home already. -Varenicline taper: 0.5mg  daily x 3 days, 0.5mg  BID x 4 days, 1mg  BID x 11 weeks -Low-dose CT chest for lung cancer screening

## 2022-09-12 NOTE — Assessment & Plan Note (Addendum)
Flare of CTS in L hand. Not currently wearing brace. Would like to discuss further with pain management but trying to avoid surgery. -Recommended use of wrist brace day and night x 2 weeks then daily after -Consider steroid injection into median nerve if not improving

## 2022-09-13 ENCOUNTER — Ambulatory Visit (HOSPITAL_COMMUNITY): Payer: MEDICAID | Attending: Family Medicine

## 2022-09-18 ENCOUNTER — Ambulatory Visit (HOSPITAL_COMMUNITY)
Admission: EM | Admit: 2022-09-18 | Discharge: 2022-09-18 | Disposition: A | Discharge status: Home / Self Care | Payer: MEDICAID | Attending: Emergency Medicine | Admitting: Emergency Medicine

## 2022-09-18 ENCOUNTER — Encounter (HOSPITAL_COMMUNITY): Payer: Self-pay

## 2022-09-18 DIAGNOSIS — G8929 Other chronic pain: Secondary | ICD-10-CM

## 2022-09-18 DIAGNOSIS — M19011 Primary osteoarthritis, right shoulder: Secondary | ICD-10-CM | POA: Diagnosis not present

## 2022-09-18 DIAGNOSIS — M25511 Pain in right shoulder: Secondary | ICD-10-CM

## 2022-09-18 MED ORDER — METHOCARBAMOL 500 MG PO TABS: 500.0000 mg | ORAL_TABLET | Freq: Two times a day (BID) | ORAL | 0 refills | Status: DC

## 2022-09-18 MED ORDER — DEXAMETHASONE SODIUM PHOSPHATE 10 MG/ML IJ SOLN
10.0000 mg | Freq: Once | INTRAMUSCULAR | Status: AC
  Administered 2022-09-18: 10 mg via INTRAMUSCULAR

## 2022-09-18 MED ORDER — DEXAMETHASONE SODIUM PHOSPHATE 10 MG/ML IJ SOLN
INTRAMUSCULAR | Status: AC
  Filled 2022-09-18: qty 1

## 2022-09-18 NOTE — ED Triage Notes (Signed)
Patient here today with c/o right shoulder pain X 1 month since starting a new job and having to lift frequently. He has a h/o neck surgery. He has a TENS unit which helps while he is using it. He is in pain mgmt.

## 2022-09-18 NOTE — ED Provider Notes (Signed)
[facility]    CSN: [id number] Arrival date & time: 09/18/22  1148      History   Chief Complaint Chief Complaint  Patient presents with   Shoulder Pain    HPI Blake Arnold is a 52 y.o. male.   Patient presents to clinic with chronic right shoulder pain that has been worsening over the past month due to repetitive motion at work.  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 He went to reach for his toothbrush earlier today to brush his teeth and had to reach with his left hand due to the pain in his right hand.  He does see orthopedics for this and has known osteoarthritis.  Was suggested to get a shoulder replacement, patient declined.  Has been doing steroid injections into his AC joint, last was several months ago.  He has been using a TENS unit, this helps but not for long.  He has been out of work for the past week and a half due to his shoulder pain.  Presents today because the pain is unbearable.  He does see someone through [facility] for pain management.  Reports that he might have to be on narcotics daily if he is going to continue working d/t his pain.    The history is provided by the patient and medical records.  Shoulder Pain   Past Medical History:  Diagnosis Date   A-fib [facility])    Abdominal aortic aneurysm (AAA) (HCC)    34mm, infrarenal in 2024. 4yr follow up surviellence   Acid reflux    Alcohol abuse    Allergy    seasonal   Anxiety    Asthma    AVN (avascular necrosis of bone) (HCC)    Blood transfusion without reported diagnosis    "during surgery"   Chronic back pain    COPD (chronic obstructive pulmonary disease) (HCC)    Depression    Hepatitis C    history of   History of urinary frequency    Hyperlipidemia    Hypertension    Peripheral neuropathy    hands and feet   Polysubstance abuse (HCC) 02/09/2010   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HCC)    Rheumatoid  arteritis (HCC)    Seizures (HCC)    15 years ago   Sleep apnea     Patient Active Problem List   Diagnosis Date Noted   Abdominal aortic aneurysm (AAA) 30 to 34 mm in diameter (HCC) 09/12/2022   GERD (gastroesophageal reflux disease) 09/12/2022   Gastroenteritis 04/23/2022   AVN (avascular necrosis of bone) (HCC) 08/02/2021   History of hepatitis C 04/12/2021   Chronic pain 11/09/2020   Tooth abscess 01/14/2020   Tongue lesion 10/11/2019   Carpal tunnel syndrome 08/24/2019   Onychomycosis 08/24/2019   Cervicalgia 06/02/2019   Left hip pain 04/06/2019   Abdominal pain 02/28/2019   Shoulder pain 12/17/2018   Hyperlipidemia 09/01/2018   HTN (hypertension) 07/24/2017   Seizure (HCC) 07/24/2017   RA (rheumatoid arthritis) (HCC) 07/24/2017   Numbness in feet 03/04/2016   Degenerative joint disease 12/28/2015   OSA (obstructive sleep apnea) 11/23/2015   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   Alcohol abuse with alcohol-induced mood disorder (HCC) 07/26/2013   Bipolar disorder, unspecified (HCC) 10/28/2012   Alcohol dependence (HCC) 04/02/2012   Major depression 04/02/2012   TOBACCO ABUSE 02/09/2010   Polysubstance abuse (HCC) 02/09/2010  FIBRILLATION, ATRIAL 06/08/2009    Past Surgical History:  Procedure Laterality Date   APPENDECTOMY     CARDIOVERSION  03/07/2006   CARPAL TUNNEL RELEASE     CERVICAL FUSION     LAPAROSCOPIC APPENDECTOMY N/A 07/23/2017   Procedure: APPENDECTOMY LAPAROSCOPIC;  Surgeon: Violeta Gelinas, MD;  Location: [facility] OR;  Service: General;  Laterality: N/A;   TOTAL HIP ARTHROPLASTY Right 2016       Home Medications    Prior to Admission medications   Medication Sig Start Date End Date Taking? Authorizing Provider  albuterol (VENTOLIN HFA) 108 (90 Base) MCG/ACT inhaler Inhale 1-2 puffs into the lungs every 6 (six) hours as needed for wheezing or shortness of breath. 05/07/22  Yes Dameron, Nolberto Hanlon, DO  amLODipine (NORVASC) 10 MG  tablet Take 1 tablet (10 mg total) by mouth daily. 05/07/22  Yes Dameron, Nolberto Hanlon, DO  ciprofloxacin (CIPRO) 500 MG tablet Take 1 tablet (500 mg total) by mouth every 12 (twelve) hours. 09/08/22  Yes Roxy Horseman, PA-C  famotidine (PEPCID) 20 MG tablet Take 1 tablet (20 mg total) by mouth daily. 09/12/22  Yes Elberta Fortis, MD  methocarbamol (ROBAXIN) 500 MG tablet Take 1 tablet (500 mg total) by mouth 2 (two) times daily. 09/18/22  Yes Rinaldo Ratel, [place] N, FNP  metoprolol tartrate (LOPRESSOR) 25 MG tablet Take 1 tablet (25 mg total) by mouth 2 (two) times daily. 05/05/22  Yes White, Patrice L, NP  naltrexone (DEPADE) 50 MG tablet Take 1 tablet (50 mg total) by mouth daily. 05/06/22  Yes White, Patrice L, NP  oxyCODONE-acetaminophen (PERCOCET) 5-325 MG tablet Take 1-2 tablets by mouth every 6 (six) hours as needed. 09/08/22  Yes Roxy Horseman, PA-C  varenicline (CHANTIX) 0.5 MG tablet Take 1 tablet (0.5 mg total) by mouth daily for 3 days, THEN 1 tablet (0.5 mg total) 2 (two) times daily for 4 days, THEN 2 tablets (1 mg total) 2 (two) times daily. 09/12/22 11/28/22 Yes Elberta Fortis, MD  diclofenac Sodium (VOLTAREN) 1 % GEL Apply 1 application topically 3 (three) times daily. Patient taking differently: Apply 2 g topically 3 (three) times daily as needed (for pain- affected sites). 12/22/19   Raulkar, Drema Pry, MD    Family History Family History  Problem Relation Age of Onset   Heart attack Mother    Atrial fibrillation Mother    Lung cancer Mother    Skin cancer Father    Colon polyps Neg Hx    Esophageal cancer Neg Hx    Stomach cancer Neg Hx    Rectal cancer Neg Hx    Colon cancer Neg Hx    Crohn's disease Neg Hx    Ulcerative colitis Neg Hx     Social History Social History   Tobacco Use   Smoking status: Every Day    Current packs/day: 1.00    Average packs/day: 1 pack/day for 38.6 years (38.6 ttl pk-yrs)    Types: Cigarettes    Start date: 02/12/1984    Passive exposure:  Current   Smokeless tobacco: Former    Quit date: 10/24/2012   Tobacco comments:    1.5-2ppd  Quit attempt 12/24/2018  Vaping Use   Vaping status: Never Used  Substance Use Topics   Alcohol use: Not Currently    Comment: 06/30/2022-states sober for 2-3 months   Drug use: Not Currently    Types: "Crack" cocaine, Cocaine    Comment: last use 07/26/13     Allergies   Lisinopril   Review of  Systems Review of Systems  Musculoskeletal:  Positive for arthralgias.     Physical Exam Triage Vital Signs ED Triage Vitals  Encounter Vitals Group     BP 09/18/22 1220 (!) 140/85     Systolic BP Percentile --      Diastolic BP Percentile --      Pulse Rate 09/18/22 1220 70     Resp 09/18/22 1220 16     Temp 09/18/22 1220 97.9 F (36.6 C)     Temp Source 09/18/22 1220 Oral     SpO2 09/18/22 1220 96 %     Weight 09/18/22 1219 250 lb (113.4 kg)     Height 09/18/22 1219 5\' 8"  (1.727 m)     Head Circumference --      Peak Flow --      Pain Score 09/18/22 1219 8     Pain Loc --      Pain Education --      Exclude from Growth Chart --    No data found.  Updated Vital Signs BP (!) 140/85 (BP Location: Right Arm)   Pulse 70   Temp 97.9 F (36.6 C) (Oral)   Resp 16   Ht 5\' 8"  (1.727 m)   Wt 250 lb (113.4 kg)   SpO2 96%   BMI 38.01 kg/m   Visual Acuity Right Eye Distance:   Left Eye Distance:   Bilateral Distance:    Right Eye Near:   Left Eye Near:    Bilateral Near:     Physical Exam Vitals and nursing note reviewed.  Constitutional:      Appearance: Normal appearance.  HENT:     Head: Normocephalic and atraumatic.     Right Ear: External ear normal.     Left Ear: External ear normal.     Nose: Nose normal.     Mouth/Throat:     Mouth: Mucous membranes are moist.  Eyes:     Conjunctiva/sclera: Conjunctivae normal.  Cardiovascular:     Rate and Rhythm: Normal rate.     Pulses: Normal pulses.  Pulmonary:     Effort: Pulmonary effort is normal. No respiratory  distress.  Musculoskeletal:        General: Tenderness present. No swelling, deformity or signs of injury.     Right shoulder: Tenderness present. No swelling, deformity or crepitus. Decreased range of motion. Normal strength. Normal pulse.       Arms:     Comments: Unable to flex his shoulder beyond 90 degrees d/t pain. Some TTP. Radial pulse 2+ with 5/5 strength in upper extremities.   Skin:    General: Skin is warm and dry.     Capillary Refill: Capillary refill takes less than 2 seconds.  Neurological:     General: No focal deficit present.     Mental Status: He is alert and oriented to person, place, and time.  Psychiatric:        Mood and Affect: Mood normal.        Behavior: Behavior normal. Behavior is cooperative.      UC Treatments / Results  Labs (all labs ordered are listed, but only abnormal results are displayed) Labs Reviewed - No data to display  EKG   Radiology No results found.  Procedures Procedures (including critical care time)  Medications Ordered in UC Medications  dexamethasone (DECADRON) injection 10 mg (has no administration in time range)    Initial Impression / Assessment and Plan / UC Course  I have  reviewed the triage vital signs and the nursing notes.  Pertinent labs & imaging results that were available during my care of the patient were reviewed by me and considered in my medical decision making (see chart for details).  Vitals and triage reviewed, patient is hemodynamically stable.  Has known osteoarthritis to his right shoulder, sees orthopedics and gets joint injections.  Currently he is unable to flex his shoulder beyond 90 degrees due to pain.  Atraumatic.  Will trial IM steroid injection, defer steroid prescription or oral steroids due to AVN.  Advised to follow-up with orthopedics to discuss repeat joint injections versus replacement.  Will withhold from narcotics as they are not indicated in chronic pain, patient does see pain  management.  Trial muscle relaxers, encouraged Ortho follow-up. POC, f/u care and return precautions given, no questions at this time.      Final Clinical Impressions(s) / UC Diagnoses   Final diagnoses:  Chronic right shoulder pain  Osteoarthritis of right shoulder, unspecified osteoarthritis type     Discharge Instructions      We will trial the muscle relaxers and the IM steroid injections.  You can also take Tylenol arthritis for pain. Please follow-up with orthopedics and pain management for further evaluation and management.   Return to clinic for new or urgent symptoms.      ED Prescriptions     Medication Sig Dispense Auth. Provider   methocarbamol (ROBAXIN) 500 MG tablet Take 1 tablet (500 mg total) by mouth 2 (two) times daily. 20 tablet , [place] N, [place]      PDMP not reviewed this encounter.   , [place] N, [place] 09/18/22 [phone]

## 2022-09-18 NOTE — Discharge Instructions (Signed)
We will trial the muscle relaxers and the IM steroid injections.  You can also take Tylenol arthritis for pain. Please follow-up with orthopedics and pain management for further evaluation and management.   Return to clinic for new or urgent symptoms.

## 2022-09-25 NOTE — Unmapped (Signed)
[facility] Refill Coordination Note    Specialty Lite Medication(s) to be Shipped:   Inflammatory Disorders: methotrexate (injectable)    Other medication(s) to be shipped: No additional medications requested for fill at this time     Joseph Sandoval, DOB: 28-Aug-1970  Phone: [phone] (home)       All above HIPAA information was verified with patient.     Was a Nurse, learning disability used for this call? No    Changes to medications: [name] reports starting the following medications: chantix and muscle relaxer  Changes to insurance: No      REFERRAL TO PHARMACIST     Referral to the pharmacist: Not needed      [facility]     Shipping address confirmed in Epic.     Delivery Scheduled: Yes, Expected medication delivery date: 8/20.     Medication will be delivered via UPS to the prescription address in Epic WAM.    Gaspar Cola Shared [facility] Specialty Technician

## 2022-09-30 MED FILL — METHOTREXATE SODIUM (CONTAINS PRESERVATIVES) 25 MG/ML INJECTION SOLUTION: SUBCUTANEOUS | 28 days supply | Qty: 4 | Fill #2

## 2022-10-03 ENCOUNTER — Encounter: Payer: MEDICAID | Admitting: Physical Medicine and Rehabilitation

## 2022-10-14 ENCOUNTER — Ambulatory Visit (HOSPITAL_COMMUNITY)
Admission: EM | Admit: 2022-10-14 | Discharge: 2022-10-14 | Disposition: A | Discharge status: Home / Self Care | Payer: MEDICAID | Attending: Family Medicine | Admitting: Family Medicine

## 2022-10-14 ENCOUNTER — Ambulatory Visit (INDEPENDENT_AMBULATORY_CARE_PROVIDER_SITE_OTHER): Payer: MEDICAID

## 2022-10-14 ENCOUNTER — Encounter (HOSPITAL_COMMUNITY): Payer: Self-pay

## 2022-10-14 DIAGNOSIS — M25511 Pain in right shoulder: Secondary | ICD-10-CM | POA: Diagnosis not present

## 2022-10-14 MED ORDER — KETOROLAC TROMETHAMINE 30 MG/ML IJ SOLN
INTRAMUSCULAR | Status: AC
  Filled 2022-10-14: qty 1

## 2022-10-14 MED ORDER — KETOROLAC TROMETHAMINE 10 MG PO TABS: 10.0000 mg | ORAL_TABLET | Freq: Four times a day (QID) | ORAL | 0 refills | Status: DC | PRN

## 2022-10-14 MED ORDER — KETOROLAC TROMETHAMINE 30 MG/ML IJ SOLN
30.0000 mg | Freq: Once | INTRAMUSCULAR | Status: AC
  Administered 2022-10-14: 30 mg via INTRAMUSCULAR

## 2022-10-14 NOTE — Discharge Instructions (Signed)
Your x-ray does not show any acute changes by my review.  Radiology will also look at your x-rays and if their interpretation differs significantly from mine, we will call you.  You have been given a shot of Toradol 30 mg today.  Ketorolac 10 mg tablets--take 1 tablet every 6 hours as needed for pain.  This is the same medicine that is in the shot we just gave you  Please follow-up with your primary care

## 2022-10-14 NOTE — ED Triage Notes (Signed)
Patient here today with c/o increased right shoulder pain since falling last Thursday. Patient was at work and he tripped backwards from stepping into a tear in a tarp that was on the ground. Patient fell onto his back. Patient had been having some pain in his right shoulder a month ago. Burning pain started after the fall.

## 2022-10-14 NOTE — ED Provider Notes (Signed)
[facility]    CSN: [id number] Arrival date & time: 10/14/22  1121      History   Chief Complaint Chief Complaint  Patient presents with   Shoulder Injury    HPI Blake Arnold is a 52 y.o. male.    Shoulder Injury  Here for right shoulder pain.  He been having some pain ongoing prior to the recent injury, but on August 29 he fell backwards when he tripped on a chair in the chart and fell onto his back.  Since then his posterior right shoulder and his shoulder joint are hurting more.  No fever no rash.  No shortness of breath and no pain in his chest.     He is allergic to lisinopril.  Past medical history includes atrial fibrillation, but he states he has not had any problem with it in a long time and he is not taking any blood thinners.   last EGFR was greater than 60 in July of this year.  He has stated it today that he is taking methocarbamol in the evenings and it does at least help him rest.   Past Medical History:  Diagnosis Date   A-fib [facility])    Abdominal aortic aneurysm (AAA) (HCC)    34mm, infrarenal in 2024. 25yr follow up surviellence   Acid reflux    Alcohol abuse    Allergy    seasonal   Anxiety    Asthma    AVN (avascular necrosis of bone) (HCC)    Blood transfusion without reported diagnosis    "during surgery"   Chronic back pain    COPD (chronic obstructive pulmonary disease) (HCC)    Depression    Hepatitis C    history of   History of urinary frequency    Hyperlipidemia    Hypertension    Peripheral neuropathy    hands and feet   Polysubstance abuse (HCC) 02/09/2010   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HCC)    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HCC)    Seizures (HCC)    15 years ago   Sleep apnea     Patient Active Problem List   Diagnosis Date Noted   Abdominal aortic aneurysm (AAA) 30 to 34 mm in diameter (HCC) 09/12/2022   GERD (gastroesophageal reflux disease) 09/12/2022   Gastroenteritis 04/23/2022   AVN (avascular necrosis of  bone) (HCC) 08/02/2021   History of hepatitis C 04/12/2021   Chronic pain 11/09/2020   Tooth abscess 01/14/2020   Tongue lesion 10/11/2019   Carpal tunnel syndrome 08/24/2019   Onychomycosis 08/24/2019   Cervicalgia 06/02/2019   Left hip pain 04/06/2019   Abdominal pain 02/28/2019   Shoulder pain 12/17/2018   Hyperlipidemia 09/01/2018   HTN (hypertension) 07/24/2017   Seizure (HCC) 07/24/2017   RA (rheumatoid arthritis) (HCC) 07/24/2017   Numbness in feet 03/04/2016   Degenerative joint disease 12/28/2015   OSA (obstructive sleep apnea) 11/23/2015   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   Alcohol abuse with alcohol-induced mood disorder (HCC) 07/26/2013   Bipolar disorder, unspecified (HCC) 10/28/2012   Alcohol dependence (HCC) 04/02/2012   Major depression 04/02/2012   TOBACCO ABUSE 02/09/2010   Polysubstance abuse (HCC) 02/09/2010   FIBRILLATION, ATRIAL 06/08/2009    Past Surgical History:  Procedure Laterality Date   APPENDECTOMY     CARDIOVERSION  03/07/2006   CARPAL TUNNEL RELEASE     CERVICAL FUSION     LAPAROSCOPIC APPENDECTOMY N/A 07/23/2017   Procedure: APPENDECTOMY LAPAROSCOPIC;  Surgeon: Violeta Gelinas, MD;  Location: [facility];  Service: General;  Laterality: N/A;   TOTAL HIP ARTHROPLASTY Right 2016       Home Medications    Prior to Admission medications   Medication Sig Start Date End Date Taking? Authorizing Provider  ketorolac (TORADOL) 10 MG tablet Take 1 tablet (10 mg total) by mouth every 6 (six) hours as needed (pain). 10/14/22  Yes Zenia Resides, MD  albuterol (VENTOLIN HFA) 108 (90 Base) MCG/ACT inhaler Inhale 1-2 puffs into the lungs every 6 (six) hours as needed for wheezing or shortness of breath. 05/07/22   Dameron, Nolberto Hanlon, DO  amLODipine (NORVASC) 10 MG tablet Take 1 tablet (10 mg total) by mouth daily. 05/07/22   Dameron, Nolberto Hanlon, DO  diclofenac Sodium (VOLTAREN) 1 % GEL Apply 1 application topically 3 (three) times  daily. Patient taking differently: Apply 2 g topically 3 (three) times daily as needed (for pain- affected sites). 12/22/19   Raulkar, Drema Pry, MD  famotidine (PEPCID) 20 MG tablet Take 1 tablet (20 mg total) by mouth daily. 09/12/22   Elberta Fortis, MD  folic acid (FOLVITE) 1 MG tablet Take 1 tablet by mouth daily. 07/12/22 07/12/23  [provider]  methocarbamol (ROBAXIN) 500 MG tablet Take 1 tablet (500 mg total) by mouth 2 (two) times daily. 09/18/22   Garrison, [place] N, FNP  Methotrexate, Anti-Rheumatic, (METHOTREXATE [place]) Inject into the skin. 07/12/22   [provider]  metoprolol tartrate (LOPRESSOR) 25 MG tablet Take 1 tablet (25 mg total) by mouth 2 (two) times daily. 05/05/22   White, Patrice L, NP  naltrexone (DEPADE) 50 MG tablet Take 1 tablet (50 mg total) by mouth daily. 05/06/22   White, Patrice L, NP  TUBERCULIN SYR 1CC/25GX5/8" 25G X 5/8" 1 ML MISC Use as directed every seven (7) days. To be used with methotrexate 07/12/22   [provider]  varenicline (CHANTIX) 0.5 MG tablet Take 1 tablet (0.5 mg total) by mouth daily for 3 days, THEN 1 tablet (0.5 mg total) 2 (two) times daily for 4 days, THEN 2 tablets (1 mg total) 2 (two) times daily. 09/12/22 11/28/22  Elberta Fortis, MD    Family History Family History  Problem Relation Age of Onset   Heart attack Mother    Atrial fibrillation Mother    Lung cancer Mother    Skin cancer Father    Colon polyps Neg Hx    Esophageal cancer Neg Hx    Stomach cancer Neg Hx    Rectal cancer Neg Hx    Colon cancer Neg Hx    Crohn's disease Neg Hx    Ulcerative colitis Neg Hx     Social History Social History   Tobacco Use   Smoking status: Every Day    Current packs/day: 1.00    Average packs/day: 1 pack/day for 38.7 years (38.7 ttl pk-yrs)    Types: Cigarettes    Start date: 02/12/1984    Passive exposure: Current   Smokeless tobacco: Former    Quit date: 10/24/2012   Tobacco comments:    1.5-2ppd  Quit  attempt 12/24/2018  Vaping Use   Vaping status: Never Used  Substance Use Topics   Alcohol use: Yes    Comment: Occasionally   Drug use: Not Currently    Types: "Crack" cocaine, Cocaine    Comment: last use 07/26/13     Allergies   Lisinopril   Review of Systems Review of Systems   Physical Exam Triage Vital Signs ED Triage Vitals  Encounter Vitals Group     BP 10/14/22 1145 122/82     Systolic BP Percentile --      Diastolic BP Percentile --      Pulse Rate 10/14/22 1145 87     Resp 10/14/22 1145 16     Temp 10/14/22 1145 98.1 F (36.7 C)     Temp Source 10/14/22 1145 Oral     SpO2 10/14/22 1145 94 %     Weight 10/14/22 1145 250 lb (113.4 kg)     Height 10/14/22 1145 5\' 8"  (1.727 m)     Head Circumference --      Peak Flow --      Pain Score 10/14/22 1142 7     Pain Loc --      Pain Education --      Exclude from Growth Chart --    No data found.  Updated Vital Signs BP 122/82 (BP Location: Right Arm)   Pulse 87   Temp 98.1 F (36.7 C) (Oral)   Resp 16   Ht 5\' 8"  (1.727 m)   Wt 113.4 kg   SpO2 94%   BMI 38.01 kg/m   Visual Acuity Right Eye Distance:   Left Eye Distance:   Bilateral Distance:    Right Eye Near:   Left Eye Near:    Bilateral Near:     Physical Exam Vitals reviewed.  Constitutional:      General: He is not in acute distress.    Appearance: He is not ill-appearing, toxic-appearing or diaphoretic.  HENT:     Mouth/Throat:     Mouth: Mucous membranes are moist.  Eyes:     Extraocular Movements: Extraocular movements intact.  Neck:     Comments: There is a well-healed midline scar in the posterior neck Cardiovascular:     Rate and Rhythm: Normal rate and regular rhythm.     Heart sounds: No murmur heard. Pulmonary:     Effort: Pulmonary effort is normal.     Breath sounds: Normal breath sounds.  Musculoskeletal:     Cervical back: Neck supple.     Comments: There is pain on passive and active range of motion of the right  shoulder.  Pulses are normal.  There is no rash or deformity  Skin:    Coloration: Skin is not pale.  Neurological:     Mental Status: He is alert and oriented to person, place, and time.  Psychiatric:        Behavior: Behavior normal.      UC Treatments / Results  Labs (all labs ordered are listed, but only abnormal results are displayed) Labs Reviewed - No data to display  EKG   Radiology No results found.  Procedures Procedures (including critical care time)  Medications Ordered in UC Medications  ketorolac (TORADOL) 30 MG/ML injection 30 mg (has no administration in time range)    Initial Impression / Assessment and Plan / UC Course  I have reviewed the triage vital signs and the nursing notes.  Pertinent labs & imaging results that were available during my care of the patient were reviewed by me and considered in my medical decision making (see chart for details).        By my review there are no acute bony changes in his shoulder.  He is advised of radiology overread.  Sling is provided here  Toradol injection is given, and Toradol tablets are sent to the pharmacy for pain relief. Final Clinical Impressions(s) /  UC Diagnoses   Final diagnoses:  Acute pain of right shoulder     Discharge Instructions      Your x-ray does not show any acute changes by my review.  Radiology will also look at your x-rays and if their interpretation differs significantly from mine, we will call you.  You have been given a shot of Toradol 30 mg today.  Ketorolac 10 mg tablets--take 1 tablet every 6 hours as needed for pain.  This is the same medicine that is in the shot we just gave you  Please follow-up with your primary care     ED Prescriptions     Medication Sig Dispense Auth. Provider   ketorolac (TORADOL) 10 MG tablet Take 1 tablet (10 mg total) by mouth every 6 (six) hours as needed (pain). 20 tablet Graelyn Bihl, Janace Aris, MD      I have reviewed the PDMP  during this encounter.   Zenia Resides, MD 10/14/22 [phone]

## 2022-10-24 NOTE — Unmapped (Signed)
[facility] Refill Coordination Note    Specialty Lite Medication(s) to be Shipped:   Inflammatory Disorders: methotrexate (injectable)    Other medication(s) to be shipped:  folic acid, syringes     [name], DOB: September 12, 1970  Phone: [phone] (home)       All above HIPAA information was verified with patient.     Was a Nurse, learning disability used for this call? No    Changes to medications: Braylan reports starting the following medications: chantix and muscle relaxer  Changes to insurance: No      REFERRAL TO PHARMACIST     Referral to the pharmacist: Not needed      [facility]     Shipping address confirmed in Epic.     Delivery Scheduled: Yes, Expected medication delivery date: 9/17.     Medication will be delivered via UPS to the prescription address in Epic WAM.    Gaspar Cola Shared [facility] Specialty Technician

## 2022-10-31 MED FILL — FOLIC ACID 1 MG TABLET: ORAL | 90 days supply | Qty: 90 | Fill #1

## 2022-10-31 MED FILL — BD TUBERCULIN SYRINGE 1 ML 25 GAUGE X 5/8": 84 days supply | Qty: 12 | Fill #1

## 2022-10-31 MED FILL — METHOTREXATE SODIUM (CONTAINS PRESERVATIVES) 25 MG/ML INJECTION SOLUTION: SUBCUTANEOUS | 28 days supply | Qty: 4 | Fill #3

## 2022-11-07 ENCOUNTER — Encounter: Payer: MEDICAID | Attending: Physical Medicine and Rehabilitation | Admitting: Physical Medicine and Rehabilitation

## 2022-11-07 ENCOUNTER — Encounter: Payer: Self-pay | Admitting: Physical Medicine and Rehabilitation

## 2022-11-07 VITALS — BP 145/91 | HR 73 | Ht 68.0 in | Wt 257.0 lb

## 2022-11-07 DIAGNOSIS — W19XXXA Unspecified fall, initial encounter: Secondary | ICD-10-CM | POA: Insufficient documentation

## 2022-11-07 DIAGNOSIS — M25511 Pain in right shoulder: Secondary | ICD-10-CM | POA: Diagnosis not present

## 2022-11-07 DIAGNOSIS — G8929 Other chronic pain: Secondary | ICD-10-CM | POA: Diagnosis not present

## 2022-11-07 DIAGNOSIS — I10 Essential (primary) hypertension: Secondary | ICD-10-CM | POA: Insufficient documentation

## 2022-11-07 MED ORDER — CAPSAICIN-CLEANSING GEL 8 % EX KIT
2.0000 | PACK | Freq: Once | CUTANEOUS | Status: AC
Start: 2022-11-07 — End: 2022-11-07
  Administered 2022-11-07: 2 via TOPICAL

## 2022-11-07 MED ORDER — DICLOFENAC SODIUM 1 % EX GEL: 2.0000 g | Freq: Four times a day (QID) | CUTANEOUS | 3 refills | Status: DC

## 2022-11-07 NOTE — Progress Notes (Signed)
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 that blood pressure is elevated today, recommended starting magnesium supplement

## 2022-11-07 NOTE — Patient Instructions (Signed)
Magnesium glycinate 250mg  HS

## 2022-11-13 ENCOUNTER — Ambulatory Visit: Payer: MEDICAID | Admitting: Orthopedic Surgery

## 2022-11-13 ENCOUNTER — Encounter: Payer: Self-pay | Admitting: Orthopedic Surgery

## 2022-11-13 DIAGNOSIS — R202 Paresthesia of skin: Secondary | ICD-10-CM

## 2022-11-13 NOTE — Progress Notes (Signed)
Office Visit Note   Patient: Blake Arnold           Date of Birth: Aug 18, 1970           MRN: [id number] Visit Date: 11/13/2022 Requested by: Elberta Fortis, MD [address] [place],  [place] [address] PCP: Elberta Fortis, MD  Subjective: Chief Complaint  Patient presents with   Right Shoulder - Pain    HPI: SUSANA AGUILLAR is a 52 y.o. male who presents to the office reporting follow-up for right shoulder pain.  He did have some right shoulder pain after a fall at the end of August.  Seen in the emergency department at that time with radiographs negative for fracture.  Does have a history of C-spine fusion x 2 with some persistent numbness and tingling in digits 4 and 5 since that fusion.  Since he was last seen has had an EMG nerve study done which does show moderate carpal tunnel syndrome.  He has had 2 carpal tunnel releases.  Denies any numbness and tingling in digits 1 through 3.  TENS unit has helped his shoulder tremendously.  He states that his left hand is not weak.  He is planning on returning to work next week potentially doing physical work.  He has had an MRI scan on the shoulder in February which was negative for rotator cuff tear.  Did have degenerative posterior superior labral tear with paralabral cyst..                ROS: All systems reviewed are negative as they relate to the chief complaint within the history of present illness.  Patient denies fevers or chills.  Assessment & Plan: Visit Diagnoses:  1. Paresthesia of skin     Plan: Impression is right shoulder pain improved with TENS unit.  Left hand looks like he has residual numbness and tingling primarily from his neck surgery.  No real intervention required on that front.  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  No intervention for the shoulder required as the TENS unit is helping his pain and there is no weakness or loss of motion today.  Follow-up as needed  Follow-Up  Instructions: No follow-ups on file.   Orders:  No orders of the defined types were placed in this encounter.  No orders of the defined types were placed in this encounter.     Procedures: No procedures performed   Clinical Data: No additional findings.  Objective: Vital Signs: There were no vitals taken for this visit.  Physical Exam:  Constitutional: Patient appears well-developed HEENT:  Head: Normocephalic Eyes:EOM are normal Neck: Normal range of motion Cardiovascular: Normal rate Pulmonary/chest: Effort normal Neurologic: Patient is alert Skin: Skin is warm Psychiatric: Patient has normal mood and affect  Ortho Exam: Ortho exam demonstrates pretty reasonable cervical spine range of motion.  5 out of 5 grip EPL FPL interosseous resection extension bicep triceps and deltoid strength.  Bilateral shoulder range of motion is 55/100/165.  Good rotator cuff strength on the right and left infraspinatus supraspinatus and subscap muscle testing with no coarse grinding or popping present in that right shoulder with passive range of motion  Specialty Comments:  IMPRESSION: Status post C5-C7 ACDF with similar appearance of known fracture and backing out of the left C7 screw better demonstrated on recent CT.  Multilevel degenerative changes most notable at C3-C4 and C4-C5 where there is mild spinal canal narrowing. Moderate to  severe C7-T1 left foraminal narrowing. There is also mild to moderate multilevel neural foraminal narrowing as above. Degenerative changes are slightly progressed since 2019.  Exam End: 07/23/21 17:13  Imaging: No results found.   PMFS History: Patient Active Problem List   Diagnosis Date Noted   Abdominal aortic aneurysm (AAA) 30 to 34 mm in diameter (HCC) 09/12/2022   GERD (gastroesophageal reflux disease) 09/12/2022   Gastroenteritis 04/23/2022   AVN (avascular necrosis of bone) (HCC) 08/02/2021   History of hepatitis C 04/12/2021   Chronic pain  11/09/2020   Tooth abscess 01/14/2020   Tongue lesion 10/11/2019   Carpal tunnel syndrome 08/24/2019   Onychomycosis 08/24/2019   Cervicalgia 06/02/2019   Left hip pain 04/06/2019   Abdominal pain 02/28/2019   Shoulder pain 12/17/2018   Hyperlipidemia 09/01/2018   HTN (hypertension) 07/24/2017   Seizure (HCC) 07/24/2017   RA (rheumatoid arthritis) (HCC) 07/24/2017   Numbness in feet 03/04/2016   Degenerative joint disease 12/28/2015   OSA (obstructive sleep apnea) 11/23/2015   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 41yr follow up surviellence   Acid reflux    Alcohol abuse    Allergy    seasonal   Anxiety    Asthma    AVN (avascular necrosis of bone) (HCC)    Blood transfusion without reported diagnosis    "during surgery"   Chronic back pain    COPD (chronic obstructive pulmonary disease) (HCC)    Depression    Hepatitis C    history of   History of urinary frequency    Hyperlipidemia    Hypertension    Peripheral neuropathy    hands and feet   Polysubstance abuse (HCC) 02/09/2010   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HCC)    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HCC)    Seizures (HCC)    15 years ago   Sleep apnea     Family History  Problem Relation Age of Onset   Heart attack Mother    Atrial fibrillation Mother    Lung cancer Mother    Skin cancer Father    Colon polyps Neg Hx    Esophageal cancer Neg Hx    Stomach cancer Neg Hx    Rectal cancer Neg Hx    Colon cancer Neg Hx    Crohn's disease Neg Hx    Ulcerative colitis Neg Hx     Past Surgical History:   Procedure Laterality Date   APPENDECTOMY     CARDIOVERSION  03/07/2006   CARPAL TUNNEL RELEASE     CERVICAL FUSION     LAPAROSCOPIC APPENDECTOMY N/A 07/23/2017   Procedure: APPENDECTOMY LAPAROSCOPIC;  Surgeon: Violeta Gelinas, MD;  Location: [facility] OR;  Service: General;  Laterality: N/A;   TOTAL HIP ARTHROPLASTY Right 2016   Social History   Occupational History   Not on file  Tobacco Use   Smoking status: Every Day    Current packs/day: 1.00    Average packs/day: 1 pack/day for 38.8 years (38.8 ttl pk-yrs)    Types: Cigarettes    Start date: 02/12/1984    Passive exposure: Current  Smokeless tobacco: Former    Quit date: 10/24/2012   Tobacco comments:    1.5-2ppd  Quit attempt 12/24/2018  Vaping Use   Vaping status: Never Used  Substance and Sexual Activity   Alcohol use: Yes    Comment: Occasionally   Drug use: Not Currently    Types: "Crack" cocaine, Cocaine    Comment: last use 07/26/13   Sexual activity: Not Currently

## 2022-11-15 ENCOUNTER — Encounter: Payer: Self-pay | Admitting: Orthopedic Surgery

## 2022-11-25 ENCOUNTER — Ambulatory Visit: Payer: MEDICAID | Admitting: Student

## 2022-11-25 ENCOUNTER — Encounter: Payer: Self-pay | Admitting: Student

## 2022-11-25 ENCOUNTER — Other Ambulatory Visit: Payer: Self-pay

## 2022-11-25 VITALS — BP 144/83 | HR 85 | Ht 68.0 in | Wt 256.6 lb

## 2022-11-25 DIAGNOSIS — Z23 Encounter for immunization: Secondary | ICD-10-CM

## 2022-11-25 DIAGNOSIS — R6889 Other general symptoms and signs: Secondary | ICD-10-CM

## 2022-11-25 DIAGNOSIS — B9689 Other specified bacterial agents as the cause of diseases classified elsewhere: Secondary | ICD-10-CM | POA: Insufficient documentation

## 2022-11-25 NOTE — Patient Instructions (Signed)
It was great to see you! Thank you for allowing me to participate in your care!   Our plans for today:  - It sounds like you have a virus that's causing all your symptoms. Be sure to stay hydrated. And eat when you can!  **If you start to feel dizzy when standing, or are having so many bowel movements that you began to feel dizzy or light headed, seek emergent medical care.    Take care and seek immediate care sooner if you develop any concerns.   Dr. Bess Kinds, MD [facility]

## 2022-11-25 NOTE — Progress Notes (Signed)
  SUBJECTIVE:   CHIEF COMPLAINT / HPI:   Flu-like symptoms HA, nausea, diarrhea, exhausted, all since Saturday. Hasn't taken his temperature, but is noticing hot sweats. Having diarrhea twice a day and is not really eating. No blood in the stool. Great niece at home is sick and just got antibiotics (he thinks walking PNA). Step father has it now. Coughing up thick flem in the am. Was able to eat a chicken pot pie and keep down, just before appointment.   PERTINENT  PMH / PSH:    OBJECTIVE:  BP (!) 144/83   Pulse 85   Ht 5\' 8"  (1.727 m)   Wt 256 lb 9.6 oz (116.4 kg)   SpO2 99%   BMI 39.02 kg/m  Physical Exam Constitutional:      General: He is not in acute distress.    Appearance: Normal appearance. He is ill-appearing.  HENT:     Nose: Congestion present.     Mouth/Throat:     Mouth: Mucous membranes are moist.  Cardiovascular:     Rate and Rhythm: Normal rate and regular rhythm.     Pulses: Normal pulses.     Heart sounds: Normal heart sounds. No murmur heard.    No friction rub. No gallop.  Pulmonary:     Effort: Pulmonary effort is normal. No respiratory distress.     Breath sounds: Normal breath sounds. No stridor. No wheezing, rhonchi or rales.  Abdominal:     General: Abdomen is flat. Bowel sounds are normal. There is no distension.     Palpations: Abdomen is soft. There is no mass.     Tenderness: There is no abdominal tenderness. There is no guarding or rebound.  Skin:    Capillary Refill: Capillary refill takes less than 2 seconds.  Neurological:     Mental Status: He is alert.  Psychiatric:        Mood and Affect: Mood normal.        Behavior: Behavior normal.      ASSESSMENT/PLAN:  Encounter for immunization [employer] Covid-19 Vaccine 69yrs & older  Flu-like symptoms Assessment & Plan: Patient note's flulike symptoms that began on Saturday, with diarrhea, headache, nausea, exhaustion/fatigue, and productive cough in the morning.  Patient  notes having 2 episodes of diarrhea a day, no blood in the diarrhea, was feeling unable to keep food down, but had chicken pot pie before appointment and was able to keep on his stomach.  Patient also appreciates some hot sweats, but has not checked his temperature during events.  Patient's lung clear to exam today, good cap refill, patient's history and host of symptoms most symptoms most concerning for viral illness.  Patient likely outside of window for treatment of flu as no flu test, patient could also be suffering from COVID although elected not to test today, also consider GI illness as multiple members of the family have this. Recommend supportive care, and note for work provided. - Supportive care - Note for work provided - Return precautions for dehydration    No follow-ups on file. Bess Kinds, MD 11/25/2022, 3:01 PM PGY-3, [facility]

## 2022-11-25 NOTE — Assessment & Plan Note (Signed)
Patient note's flulike symptoms that began on Saturday, with diarrhea, headache, nausea, exhaustion/fatigue, and productive cough in the morning.  Patient notes having 2 episodes of diarrhea a day, no blood in the diarrhea, was feeling unable to keep food down, but had chicken pot pie before appointment and was able to keep on his stomach.  Patient also appreciates some hot sweats, but has not checked his temperature during events.  Patient's lung clear to exam today, good cap refill, patient's history and host of symptoms most symptoms most concerning for viral illness.  Patient likely outside of window for treatment of flu as no flu test, patient could also be suffering from COVID although elected not to test today, also consider GI illness as multiple members of the family have this. Recommend supportive care, and note for work provided. - Supportive care - Note for work provided - Return precautions for dehydration

## 2022-11-28 NOTE — Unmapped (Signed)
The [facility] has made a second and final attempt to reach this patient to refill the following medication:methotrexate (CONTAINS PRESERVATIVES) 25 mg/mL injection solution.      We have left voicemails on the following phone numbers: [phone] and have sent a text message to the following phone numbers: [phone] .    Dates contacted: 10/10, 10/16  Last scheduled delivery: 9/19- 28ds    The patient may be at risk of non-compliance with this medication. The patient should call the [facility] at [phone]  Option 4, then Option 2: Dermatology, Gastroenterology, Rheumatology to refill medication.    [facility] Specialty Technician

## 2022-12-04 ENCOUNTER — Ambulatory Visit: Payer: MEDICAID

## 2022-12-04 VITALS — BP 138/85 | HR 84 | Temp 98.0°F | Ht 68.0 in | Wt 259.0 lb

## 2022-12-04 DIAGNOSIS — J019 Acute sinusitis, unspecified: Secondary | ICD-10-CM | POA: Diagnosis not present

## 2022-12-04 DIAGNOSIS — B9689 Other specified bacterial agents as the cause of diseases classified elsewhere: Secondary | ICD-10-CM | POA: Diagnosis not present

## 2022-12-04 MED ORDER — AMOXICILLIN-POT CLAVULANATE 875-125 MG PO TABS
1.0000 | ORAL_TABLET | Freq: Two times a day (BID) | ORAL | 0 refills | Status: AC
Start: 2022-12-04 — End: 2022-12-09

## 2022-12-04 NOTE — Assessment & Plan Note (Addendum)
Second sickening after previous viral syndrome.  Physical exam and history consistent with acute bacterial rhinosinusitis will treat with 5 days of high-dose Augmentin.  Further recommend continued use of Flonase in addition to nasal saline and attempts to decrease tobacco use further.

## 2022-12-04 NOTE — Progress Notes (Signed)
  SUBJECTIVE:   CHIEF COMPLAINT / HPI:   Presents today for concern of flu.  Of note he was seen on 11/25/2022 for flulike symptoms (headache, congested cough, nausea, diarrhea, fatigue) with known sick contact at home.  Patient states he got better for a couple days but then on Saturday some of his symptoms came back such as headache, ear pain, nasal congestion and congested cough.  He is having normal bowel movements.  He is taking Mucinex which has not been very helpful and additionally using Flonase.  Further, he has cut back on his cigarette smoking to 3 to 4 cigarettes/day with the assistance of Chantix.  PERTINENT  PMH / PSH: A-fib, polysubstance use, HTN, rheumatoid arthritis, HLD, OSA  OBJECTIVE:  BP 138/85   Pulse 84   Temp 98 F (36.7 C)   Ht 5\' 8"  (1.727 m)   Wt 259 lb (117.5 kg)   SpO2 96%   BMI 39.38 kg/m  General: NAD HEENT: Normal TMs, frontal and maxillary sinus tenderness, erythematous nasal turbinates with yellow/mucus discharge, clear oropharynx, bilateral cervical lymphadenopathy CV: RRR, no murmurs auscultated Pulm: CTAB, normal WOB  ASSESSMENT/PLAN:   Assessment & Plan Acute bacterial rhinosinusitis Second sickening after previous viral syndrome.  Physical exam and history consistent with acute bacterial rhinosinusitis will treat with 5 days of high-dose Augmentin.  Further recommend continued use of Flonase in addition to nasal saline and attempts to decrease tobacco use further.   Return if symptoms worsen or fail to improve. Shelby Mattocks, DO 12/04/2022, 9:26 AM PGY-3, [facility]

## 2022-12-04 NOTE — Patient Instructions (Addendum)
It was great to see you today! Thank you for choosing [facility] for your primary care.  Today we addressed: You have a sinusitis which you got after having a viral exposure.  Please continue to use Flonase and I would further recommend using nasal saline spray.  I have prescribed you Augmentin to take twice a day x 5 days.  Please continue to decrease your smoking as able.  If you haven't already, sign up for My Chart to have easy access to your labs results, and communication with your primary care physician. Call the clinic at [phone] if your symptoms worsen or you have any concerns. Return if symptoms worsen or fail to improve. Please arrive 15 minutes before your appointment to ensure smooth check in process.  We appreciate your efforts in making this happen.  Thank you for allowing me to participate in your care, Blake Mattocks, DO 12/04/2022, 9:26 AM PGY-3, [facility]

## 2022-12-05 NOTE — Unmapped (Signed)
[facility] RHEUMATOLOGY CLINIC - PHARMACIST NOTES    Called patient regarding his methotrexate refill since [facility] pharmacy has not been able to reach him. Patient said that he was meant to call back to the pharmacy to get the medication but he forgot. He will reach out to the [facility] pharmacy.     Jama Flavors, PharmD Candidate

## 2022-12-20 ENCOUNTER — Ambulatory Visit: Admit: 2022-12-20 | Discharge: 2022-12-20 | Payer: MEDICARE

## 2022-12-20 ENCOUNTER — Ambulatory Visit
Admit: 2022-12-20 | Discharge: 2022-12-20 | Payer: MEDICARE | Attending: Orthopaedic Surgery | Primary: Orthopaedic Surgery

## 2022-12-20 DIAGNOSIS — M5412 Radiculopathy, cervical region: Principal | ICD-10-CM

## 2022-12-20 NOTE — Unmapped (Signed)
ORTHOPAEDIC SPINE CLINIC NOTE       Nemiah Commander, MD  Clinical Professor of Orthopaedics  [phone]      Patient Name: Joseph Sandoval  MRN: [id number]  DOB: September 11, 1970    Date: 12/20/2022    PCP: Alric Quan, [facility]    ASSESSMENT:     52 y.o. male  [name]  Former contruction. Recov EtOH/71yrs. Applying for disability.  Neuropathy. Afib. COPD. OSA.   Bipolar. Tob. Seropos RA. HepC.    Neck and L.UE pain/n/t since 05/2021 -left arm pain has now resolved following surgery on 10/04/21.  S/p C5-7 ACDF (03/31/17)  XR-C 06/2021 - Worsening screw protrusion.  CT-C 07/2021 - C6-7 fusion status indeterminate.   MR-C 07/2021 - Mild C3-5 disc bulges. Left C7-T1 foram stenosis.  S/p anterior screw removal (B.Sullivan) (09/12/21)  S/p C5-7 PCF, L.C7-T1 foraminotomy (10/04/21)     PLAN:     Patient Instructions   Your diagnosis: Cervical Fusion    Recommendations/Plan  Medications: Take over-the-counter medications as needed and as tolerated  Activity: Activities as tolerated.  You may work as tolerated.  Imaging studies: None  Follow-up: As needed.  Please contact us via MyChart or phone.    Contact our Spine Surgery Nurse Coordinator Shanda Bumps) via MyChart or at [phone] with any questions/concerns.      SUBJECTIVE:     Chief Complaint:  Neck surgery    History of Present Illness:        12/20/22 1446   PainSc: 0-No pain     Doing well  No neck pain  Some residual numbness left arm       Medical History   Past Medical History:   Diagnosis Date    Alcoholism (CMS-HCC)     Anxiety     Asthma     Atrial fibrillation (CMS-HCC)     Bipolar affective (CMS-HCC)     Depression     Hepatitis C     TREATED    Hyperlipidemia     Hypertension     Joint pain     Polysubstance abuse (CMS-HCC)     RA (rheumatoid arthritis) (CMS-HCC)     Seizures (CMS-HCC)     none since ~2005    Shoulder injury     Sleep apnea, obstructive      Patient Active Problem List   Diagnosis    Alcohol-induced mood disorder (CMS-HCC)    Alcohol withdrawal syndrome (CMS-HCC)    Bipolar affective disorder (CMS-HCC)    Bronchiolitis    Chest pain    Atrial fibrillation (CMS-HCC)    Major depressive disorder    Tobacco dependence syndrome    Essential hypertension (RAF-HCC)    OSA (obstructive sleep apnea)    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 (CMS-HCC)    HLD (hyperlipidemia)    Degenerative joint disease    Numbness in feet    Carpal tunnel syndrome, left    Rheumatoid arthritis involving multiple sites (CMS-HCC)    Cervical spine pain    Seropositive rheumatoid arthritis (CMS-HCC)      Surgical History   He  has a past surgical history that includes Cardiac surgery; pr total hip arthroplasty (Right, 12/28/2015); pr wrist arthroscop,release xvers lig (Left, 03/21/2016); pr nervous system surgery unlisted (N/A, 03/21/2016); pr rad excis wrist synov/tendon,flexor (Left, 03/21/2016); pr revise median n/carpal tunnel surg (Left, 10/08/2016); pr revise median n/carpal tunnel surg (Right, 10/22/2016); pr arthrodesis ant interbody inc discectomy, cervical below c2 (N/A, 03/31/2017); pr arthrodesis  ant interbody inc discectomy, cervical below c2 each addl (N/A, 03/31/2017); pr anterior instrumentation 2-3 vertebral segments (N/A, 03/31/2017); pr insj biomchn dev intervertebral dsc spc w/arthrd (N/A, 03/31/2017); pr autograft spine surgery local from same incision (N/A, 03/31/2017); pr allograft for spine surgery only morselized (N/A, 03/31/2017); pr ionm 1 on 1 in or w/attendance each 15 minutes (N/A, 03/31/2017); pr laryngoscopy,direct,diagnostic (N/A, 09/12/2021); pr esophagoscopy,diagnostic (N/A, 09/12/2021); pr i&d deep absc/hematoma neck/chest (N/A, 09/12/2021); pr remove spine fix dev,anterior (N/A, 09/12/2021); pr excis cerv disk,one level (N/A, 10/04/2021); pr arthrd pst/pstlat tq 1ntrspc crv belw c2 segment (N/A, 10/04/2021); pr arthrodesis pst/pstlat tq 1ntrspc ea addl ntrspc (N/A, 10/04/2021); pr posterior non-segmental instrumentation (N/A, 10/04/2021); pr autograft spine surgery local from same incision (N/A, 10/04/2021); and pr ionm 1 on 1 in or w/attendance each 15 minutes (N/A, 10/04/2021).     Allergies   Lisinopril   Medications   Current Outpatient Medications   Medication Sig Dispense Refill    acetaminophen (TYLENOL) 500 MG tablet Take 2 tablets (1,000 mg total) by mouth every six (6) hours as needed for pain.      amLODIPine (NORVASC) 10 MG tablet Take 1 tablet (10 mg total) by mouth daily.      diclofenac sodium (VOLTAREN) 1 % gel Apply 4 g topically four (4) times a day as needed.      empty container Misc Use as directed 1 each PRN    folic acid (FOLVITE) 1 MG tablet Take 1 tablet (1 mg total) by mouth daily. 90 tablet 3    ibuprofen (MOTRIN) 800 MG tablet Take 1 tablet (800 mg total) by mouth every eight (8) hours as needed.      loratadine (CLARITIN) 10 mg tablet Take 1 tablet (10 mg total) by mouth daily.      methotrexate sodium (METHOTREXATE, CONTAINS PRESERVATIVES,) 25 mg/mL injection solution Inject 0.8 mL (20 mg total) under the skin every seven (7) days. 12 mL 1    metoprolol tartrate (LOPRESSOR) 25 MG tablet Take 1 tablet (25 mg total) by mouth two (2) times a day.      naltrexone (DEPADE) 50 mg tablet Take 1 tablet (50 mg total) by mouth daily.      nicotine (NICODERM CQ) 21 mg/24 hr patch Place 21 mg on the skin.      simvastatin (ZOCOR) 40 MG tablet Take 1 tablet (40 mg total) by mouth nightly.      syringe with needle (TUBERCULIN SYRINGE) 1 mL 25 gauge x 5/8 Syrg Use as directed every seven (7) days. To be used with methotrexate 50 each 1     No current facility-administered medications for this visit.      Social History   Social History     Social History Narrative    Not on file     He  reports that he has been smoking cigarettes. He started smoking about 38 years ago. He has a 38.6 pack-year smoking history. He has quit using smokeless tobacco.  His smokeless tobacco use included snuff. He reports current alcohol use. He reports that he does not use drugs.   The history recorded in the table above was reviewed.    OBJECTIVE:     PHYSICAL EXAM:  Vitals: Temp 36.9 ??C (98.4 ??F) (Temporal)  - Wt (!) 116.2 kg (256 lb 1.6 oz)  - BMI 38.94 kg/m??   Appearance: well-nourished and no acute distress   Affect: alert, cooperative, and pleasant  Strength/Extremities/Skin: Strength intact in bilat UE.  Incision healed.  Gait: normal     MEDICAL DECISION MAKING    Test Results:  Imaging:   C-spine XR. Today.  Implants stable. No instability.  The available reports were reviewed.    Discussion:  Activities - Advised activities as tolerated, using pain as a guide.    Orders:  No orders of the defined types were placed in this encounter.       cc: Referring, None Per Rinaldo Cloud, [facility]    E&M Coding:  Number/Complexity of Problems Addressed: 1 stable chronic illness (99203/99213)  Amount/Complexity of Data to be Reviewed/Analyzed: Independent interpretation of a test performed by another physician/other qualified health care professional (99204/99214)  Risk of Complications/Morbidity/Mortality of Management: --LOW Risk of Morbidity from Additional Diagnostic Testing or Treatment (99203/99213)--

## 2022-12-20 NOTE — Unmapped (Signed)
Your diagnosis: Cervical Fusion    Recommendations/Plan  Medications: Take over-the-counter medications as needed and as tolerated  Activity: Activities as tolerated.  You may work as tolerated.  Imaging studies: None  Follow-up: As needed.  Please contact us via MyChart or phone.    Contact our Spine Surgery Nurse Coordinator Shanda Bumps) via MyChart or at [phone] with any questions/concerns.

## 2023-01-06 NOTE — Unmapped (Signed)
[facility] Refill Coordination Note    Specialty Medication(s) to be Shipped:   Specialty Lite: methotrexate    Other medication(s) to be shipped: No additional medications requested for fill at this time     Joseph Sandoval, DOB: [birth year]  Phone: [phone] (home)       All above HIPAA information was verified with patient.     Was a Nurse, learning disability used for this call? No    Completed refill call assessment today to schedule patient's medication shipment from the [facility]  [phone]).  All relevant notes have been reviewed.     Specialty medication(s) and dose(s) confirmed:  Joseph Sandoval has been sick with the flu off and on over the past few months. He feels well enough to resume methotrexate injections.    Changes to medications: Joseph Sandoval reports no changes at this time.  Changes to insurance: No  New side effects reported not previously addressed with a pharmacist or physician: None reported  Questions for the pharmacist: No    Confirmed patient received a Conservation officer, historic buildings and a Surveyor, mining with first shipment. The patient will receive a drug information handout for each medication shipped and additional FDA Medication Guides as required.       DISEASE/MEDICATION-SPECIFIC INFORMATION        For patients on injectable medications: Patient currently has 1 doses left.  Next injection is scheduled for 01/06/23.    SPECIALTY MEDICATION ADHERENCE     Medication Adherence    Patient reported X missed doses in the last month: 4  Specialty Medication: methotrexate (CONTAINS PRESERVATIVES) 25 mg/mL injection solution  Patient is on additional specialty medications: No  Patient is on more than two specialty medications: No  Any gaps in refill history greater than 2 weeks in the last 3 months: no  Demonstrates understanding of importance of adherence: yes  Informant: patient  Reliability of informant: reliable  Provider-estimated medication adherence level: good  Patient is at risk for Non-Adherence: No  Reasons for non-adherence: no problems identified  Adherence tools used: calendar              Were doses missed due to medication being on hold? No    methotrexate (CONTAINS PRESERVATIVES) 25 mg/mL injection solution  : 1 doses of medicine on hand       REFERRAL TO PHARMACIST     Referral to the pharmacist: Not needed      SHIPPING     Shipping address confirmed in Epic.       Delivery Scheduled: Yes, Expected medication delivery date: 01/08/23.     Medication will be delivered via UPS to the prescription address in Epic WAM.    [name]  Specialty Technician

## 2023-01-07 MED FILL — METHOTREXATE SODIUM (CONTAINS PRESERVATIVES) 25 MG/ML INJECTION SOLUTION: SUBCUTANEOUS | 28 days supply | Qty: 4 | Fill #4

## 2023-01-17 ENCOUNTER — Ambulatory Visit (HOSPITAL_COMMUNITY)
Admission: EM | Admit: 2023-01-17 | Discharge: 2023-01-17 | Disposition: A | Discharge status: Home / Self Care | Payer: MEDICAID | Attending: Emergency Medicine | Admitting: Emergency Medicine

## 2023-01-17 ENCOUNTER — Ambulatory Visit (INDEPENDENT_AMBULATORY_CARE_PROVIDER_SITE_OTHER): Payer: MEDICAID

## 2023-01-17 ENCOUNTER — Ambulatory Visit: Payer: MEDICAID

## 2023-01-17 ENCOUNTER — Encounter (HOSPITAL_COMMUNITY): Payer: Self-pay

## 2023-01-17 DIAGNOSIS — J441 Chronic obstructive pulmonary disease with (acute) exacerbation: Secondary | ICD-10-CM | POA: Diagnosis not present

## 2023-01-17 MED ORDER — PROMETHAZINE-DM 6.25-15 MG/5ML PO SYRP: 5.0000 mL | ORAL_SOLUTION | Freq: Four times a day (QID) | ORAL | 0 refills | Status: DC | PRN

## 2023-01-17 MED ORDER — AZITHROMYCIN 250 MG PO TABS: 250.0000 mg | ORAL_TABLET | Freq: Every day | ORAL | 0 refills | Status: DC

## 2023-01-17 MED ORDER — PREDNISONE 20 MG PO TABS: 40.0000 mg | ORAL_TABLET | Freq: Every day | ORAL | 0 refills | Status: AC

## 2023-01-17 NOTE — ED Triage Notes (Signed)
Pt presents complaining mainly of shortness of breath, cough, and dizziness x 5 days. Pt currently denies pain however does reports "a burning sensation in my esophagus." Pt states he has taken Robitussin and Mucinex with no relief. Pt believes he may have "walking pneumonia."

## 2023-01-17 NOTE — ED Provider Notes (Signed)
[facility]    CSN: [id number] Arrival date & time: 01/17/23  1514      History   Chief Complaint Chief Complaint  Patient presents with   Cough   Dizziness    HPI Blake Arnold is a 52 y.o. male.   Patient presents to clinic complaining of shortness of breath, productive cough, and feeling warm for the past 5 days.  The cough is been productive with a yellow/brown sputum.  He has not measured his temperature at home but he has felt warm.  He lives with 3 children, reports they are not his, and they are always sick.  He has been taking Robitussin and Mucinex without relief.  He has not had any abdominal pain, vomiting or diarrhea.  He does have a history of COPD.    The history is provided by the patient and medical records.  Cough Dizziness   Past Medical History:  Diagnosis Date   A-fib [facility])    Abdominal aortic aneurysm (AAA) (HCC)    34mm, infrarenal in 2024. 63yr follow up surviellence   Acid reflux    Alcohol abuse    Allergy    seasonal   Anxiety    Asthma    AVN (avascular necrosis of bone) (HCC)    Blood transfusion without reported diagnosis    "during surgery"   Chronic back pain    COPD (chronic obstructive pulmonary disease) (HCC)    Depression    Hepatitis C    history of   History of urinary frequency    Hyperlipidemia    Hypertension    Peripheral neuropathy    hands and feet   Polysubstance abuse (HCC) 02/09/2010   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HCC)    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HCC)    Seizures (HCC)    15 years ago   Sleep apnea     Patient Active Problem List   Diagnosis Date Noted   Acute bacterial rhinosinusitis 11/25/2022   Abdominal aortic aneurysm (AAA) 30 to 34 mm in diameter (HCC) 09/12/2022   GERD (gastroesophageal reflux disease) 09/12/2022   Gastroenteritis 04/23/2022   AVN (avascular necrosis of bone) (HCC) 08/02/2021   History of hepatitis C 04/12/2021   Chronic pain 11/09/2020   Tooth abscess 01/14/2020    Tongue lesion 10/11/2019   Carpal tunnel syndrome 08/24/2019   Onychomycosis 08/24/2019   Cervicalgia 06/02/2019   Left hip pain 04/06/2019   Abdominal pain 02/28/2019   Shoulder pain 12/17/2018   Hyperlipidemia 09/01/2018   HTN (hypertension) 07/24/2017   Seizure (HCC) 07/24/2017   RA (rheumatoid arthritis) (HCC) 07/24/2017   Numbness in feet 03/04/2016   Degenerative joint disease 12/28/2015   OSA (obstructive sleep apnea) 11/23/2015   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   Alcohol abuse with alcohol-induced mood disorder (HCC) 07/26/2013   Bipolar disorder, unspecified (HCC) 10/28/2012   Alcohol dependence (HCC) 04/02/2012   Major depression 04/02/2012   TOBACCO ABUSE 02/09/2010   Polysubstance abuse (HCC) 02/09/2010   FIBRILLATION, ATRIAL 06/08/2009    Past Surgical History:  Procedure Laterality Date   APPENDECTOMY     CARDIOVERSION  03/07/2006   CARPAL TUNNEL RELEASE     CERVICAL FUSION     LAPAROSCOPIC APPENDECTOMY N/A 07/23/2017   Procedure: APPENDECTOMY LAPAROSCOPIC;  Surgeon: Violeta Gelinas, MD;  Location: [facility] OR;  Service: General;  Laterality: N/A;   TOTAL HIP ARTHROPLASTY Right 2016       Home Medications    Prior to Admission medications   Medication  Sig Start Date End Date Taking? Authorizing Provider  albuterol (VENTOLIN HFA) 108 (90 Base) MCG/ACT inhaler Inhale 1-2 puffs into the lungs every 6 (six) hours as needed for wheezing or shortness of breath. 05/07/22   Dameron, Nolberto Hanlon, DO  amLODipine (NORVASC) 10 MG tablet Take 1 tablet (10 mg total) by mouth daily. 05/07/22   Dameron, Nolberto Hanlon, DO  azithromycin (ZITHROMAX) 250 MG tablet Take 1 tablet (250 mg total) by mouth daily. Take first 2 tablets together, then 1 every day until finished. 01/17/23  Yes Rinaldo Ratel, [place] N, FNP  diclofenac Sodium (VOLTAREN) 1 % GEL Apply 2 g topically 4 (four) times daily. 11/07/22   Raulkar, Drema Pry, MD  famotidine (PEPCID) 20 MG tablet Take 1 tablet (20 mg  total) by mouth daily. 09/12/22   Elberta Fortis, MD  folic acid (FOLVITE) 1 MG tablet Take 1 tablet by mouth daily. 07/12/22 07/12/23  [provider]  methocarbamol (ROBAXIN) 500 MG tablet Take 1 tablet (500 mg total) by mouth 2 (two) times daily. 09/18/22   Alaijah Gibler, [place] N, FNP  Methotrexate, Anti-Rheumatic, (METHOTREXATE [place]) Inject into the skin. 07/12/22   [provider]  metoprolol tartrate (LOPRESSOR) 25 MG tablet Take 1 tablet (25 mg total) by mouth 2 (two) times daily. 05/05/22   White, Patrice L, NP  naltrexone (DEPADE) 50 MG tablet Take 1 tablet (50 mg total) by mouth daily. 05/06/22   White, Patrice L, NP  predniSONE (DELTASONE) 20 MG tablet Take 2 tablets (40 mg total) by mouth daily for 5 days. 01/17/23 01/22/23 Yes Rinaldo Ratel, [place] N, FNP  promethazine-dextromethorphan (PROMETHAZINE-DM) 6.25-15 MG/5ML syrup Take 5 mLs by mouth 4 (four) times daily as needed for cough. 01/17/23  Yes Rinaldo Ratel, [place] N, FNP  TUBERCULIN SYR 1CC/25GX5/8" 25G X 5/8" 1 ML MISC Use as directed every seven (7) days. To be used with methotrexate 07/12/22   [provider]    Family History Family History  Problem Relation Age of Onset   Heart attack Mother    Atrial fibrillation Mother    Lung cancer Mother    Skin cancer Father    Colon polyps Neg Hx    Esophageal cancer Neg Hx    Stomach cancer Neg Hx    Rectal cancer Neg Hx    Colon cancer Neg Hx    Crohn's disease Neg Hx    Ulcerative colitis Neg Hx     Social History Social History   Tobacco Use   Smoking status: Every Day    Current packs/day: 1.00    Average packs/day: 1 pack/day for 38.9 years (38.9 ttl pk-yrs)    Types: Cigarettes    Start date: 02/12/1984    Passive exposure: Current   Smokeless tobacco: Former    Quit date: 10/24/2012   Tobacco comments:    1.5-2ppd  Quit attempt 12/24/2018  Vaping Use   Vaping status: Never Used  Substance Use Topics   Alcohol use: Yes    Comment: Occasionally    Drug use: Not Currently    Types: "Crack" cocaine, Cocaine    Comment: last use 07/26/13     Allergies   Lisinopril   Review of Systems Review of Systems  Per HPI   Physical Exam Triage Vital Signs ED Triage Vitals  Encounter Vitals Group     BP 01/17/23 1631 (!) 148/90     Systolic BP Percentile --      Diastolic BP Percentile --      Pulse Rate 01/17/23 1631 85  Resp 01/17/23 1631 20     Temp 01/17/23 1631 97.9 F (36.6 C)     Temp src --      SpO2 01/17/23 1631 95 %     Weight 01/17/23 1630 265 lb (120.2 kg)     Height --      Head Circumference --      Peak Flow --      Pain Score 01/17/23 1629 0     Pain Loc --      Pain Education --      Exclude from Growth Chart --    No data found.  Updated Vital Signs BP (!) 148/90 (BP Location: Right Arm)   Pulse 85   Temp 97.9 F (36.6 C)   Resp 20   Wt 265 lb (120.2 kg)   SpO2 95%   BMI 40.29 kg/m   Visual Acuity Right Eye Distance:   Left Eye Distance:   Bilateral Distance:    Right Eye Near:   Left Eye Near:    Bilateral Near:     Physical Exam Vitals and nursing note reviewed.  Constitutional:      Appearance: Normal appearance.  HENT:     Head: Normocephalic and atraumatic.     Right Ear: External ear normal.     Left Ear: External ear normal.     Nose: Nose normal.     Mouth/Throat:     Mouth: Mucous membranes are moist.  Eyes:     Conjunctiva/sclera: Conjunctivae normal.  Cardiovascular:     Rate and Rhythm: Normal rate and regular rhythm.     Heart sounds: Normal heart sounds. No murmur heard. Pulmonary:     Effort: Pulmonary effort is normal.     Breath sounds: Normal breath sounds. No wheezing.  Musculoskeletal:        General: Normal range of motion.  Skin:    General: Skin is warm and dry.  Neurological:     General: No focal deficit present.     Mental Status: He is alert.  Psychiatric:        Mood and Affect: Mood normal.      UC Treatments / Results  Labs (all  labs ordered are listed, but only abnormal results are displayed) Labs Reviewed - No data to display  EKG   Radiology No results found.  Procedures Procedures (including critical care time)  Medications Ordered in UC Medications - No data to display  Initial Impression / Assessment and Plan / UC Course  I have reviewed the triage vital signs and the nursing notes.  Pertinent labs & imaging results that were available during my care of the patient were reviewed by me and considered in my medical decision making (see chart for details).  Vitals and triage reviewed, patient is hemodynamically stable.  Heart with regular rate and rhythm, lungs with mild expiratory wheezing.  Imaging awaiting official radiology overread, interpretation will not change plan of care.  Increased sputum viscosity, increased dyspnea and history of COPD, will cover for COPD exacerbation with azithromycin and prednisone.  Plan of care, follow-up care return precautions given, no questions at this time.     Final Clinical Impressions(s) / UC Diagnoses   Final diagnoses:  COPD exacerbation (HCC)     Discharge Instructions      Take all antibiotics as prescribed and until finished, you can take them with food to prevent gastrointestinal upset.  Use the cough medicine as needed, this may cause drowsiness so do  not drink or drive on this medication.  Start the steroids tomorrow with breakfast.  Your symptoms should improve with these medications, if no improvement or any changes please follow-up with your primary care provider or return to clinic.     ED Prescriptions     Medication Sig Dispense Auth. Provider   predniSONE (DELTASONE) 20 MG tablet Take 2 tablets (40 mg total) by mouth daily for 5 days. 10 tablet Rinaldo Ratel, [place] N, [place]   promethazine-dextromethorphan (PROMETHAZINE-DM) 6.25-15 MG/5ML syrup Take 5 mLs by mouth 4 (four) times daily as needed for cough. 118 mL Rinaldo Ratel, [place] N, FNP    azithromycin (ZITHROMAX) 250 MG tablet Take 1 tablet (250 mg total) by mouth daily. Take first 2 tablets together, then 1 every day until finished. 6 tablet Gissele Narducci, [place] N, [place]      PDMP not reviewed this encounter.   Xzavier Swinger, [place] N, [place] 01/17/23 1758

## 2023-01-17 NOTE — Discharge Instructions (Addendum)
Take all antibiotics as prescribed and until finished, you can take them with food to prevent gastrointestinal upset.  Use the cough medicine as needed, this may cause drowsiness so do not drink or drive on this medication.  Start the steroids tomorrow with breakfast.  Your symptoms should improve with these medications, if no improvement or any changes please follow-up with your primary care provider or return to clinic.

## 2023-01-21 NOTE — Unmapped (Unsigned)
Assessment/Plan:   Joseph Sandoval is a 52 y.o. male with a history of seropositive (+CCP) RA, AVN (R hip replacement), ***afib, htn, hld, hepatitis C (Rx with [facility]) who presents for evaluation.     Seropositive RA   ***       Health Care Maintenance:  Immunization History   Administered Date(s) Administered Comments    COVID-19 VAC,BIVALENT(21YR UP),PFIZER 11/01/2020 Historical - Not administered in Epic    COVID-19 VACCINE,MRNA(MODERNA)(PF) 04/26/2019 Historical - Not administered in Epic     05/24/2019 Historical - Not administered in Epic     12/22/2019 Historical - Not administered in Epic    Covid-19 Vac, (5yr+) (Comirnaty) Mrna Pfizer  11/25/2022 Adminis    HEPATITIS B VACCINE ADULT,IM(ENERGIX B, RECOMBIVAX) 01/20/2015     Influenza Vaccine Quad(IM)6 MO-Adult(PF) 12/02/2016      01/05/2018     Influenza Virus Vaccine, unspecified formulation 12/11/2015     PNEUMOCOCCAL POLYSACCHARIDE 23-VALENT 12/09/2016     Pneumococcal Conjugate 13-Valent 07/15/2016     Pneumococcal Conjugate 20-valent 07/12/2022     TdaP 08/09/2013      ***the following are needed: ***COVID, PCV 13, PCV 23, Tdap, Influenza, Shingles   CDC recommends all immunosuppressed adults receive vaccination against pneumonococcus and shingles.   For those who have not previously received any pneumococcal vaccine, CDC recommends you:  Give 1 dose of PCV15 or PCV20.  If PCV15 is used, this should be followed by a dose of PPSV23 at least one year later. The minimum interval is 8 weeks and can be considered in adults with an immunocompromising condition, cochlear implant, or cerebrospinal fluid leak.  If PCV20 is used, a dose of PPSV23 is NOT indicated.  For those who have only received PPSV23, CDC recommends you:  May give 1 dose of PCV15 or PCV20.  The PCV15 or PCV20 dose should be administered at least one year after the most recent PPSV23 vaccination.  Regardless of if PCV15 or PCV20 is given, an additional dose of PPSV23 is not recommended since they already received it.  For those who have received PCV13 with or without PPSV23, CDC recommends you:  Give PPSV23 as previously recommended.* See Pneumococcal Vaccine Timing for Adultspdf icon for specific guidance. The incremental public health benefits of providing PCV15 or PCV20 to adults who have received PCV13 only or both PCV13 and PPSV23 have not been evaluated.    Bone health:   *** DEXA Scan:       - Vitamin D:       There are no diagnoses linked to this encounter.    No follow-ups on file.    I appreciate the opportunity to participate and collaborate in the care of this patient.      History of Present Illness:     Primary Care Provider: [place]    HPI:  Joseph Sandoval is a 51 y.o.  male    As a background established care with [facility] rheumatology in 2017 for polyarthralgia. Labs with neg RF and +CCP 18.0. Initial visit showed synovitis of hands consistent with RA. XR w/o erosions of hands. Additionally had hip pain and XR showing AVN b/l hips. 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 Seen by NP Andrey Campanile 06/2022 been off all DMARD tx for the last 1.5-2 years. First stopped the mtx. Was taking mtx injection and his hand slipped, causing the injection to hurt. He did not want to take it after this. Then he felt he was doing  very well, and thought he may not need the humira any longer, so stopped the humira. Within 6 mo had notable worsening of pain. 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     Treatment history:  - mtx started 06/2015, maximized to 25 mg sq dosing 08/2015  - HCQ added 07/2016  - Humira added 11/2016  - persistent synovitis on exam, humira increased to weekly dosing 05/2017  - HCQ stopped 05/2017 due to lack of benefit   - restart methotrexate 20 mg     ***Today     Balance of 10 systems was reviewed and is negative except for that mentioned in the HPI.    Review of records: I have reviewed labs/images/clinic notes per the computerized medical record.       Allergies:  Lisinopril    Medications:     Current Outpatient Medications:     acetaminophen (TYLENOL) 500 MG tablet, Take 2 tablets (1,000 mg total) by mouth every six (6) hours as needed for pain., Disp: , Rfl:     amLODIPine (NORVASC) 10 MG tablet, Take 1 tablet (10 mg total) by mouth daily., Disp: , Rfl:     diclofenac sodium (VOLTAREN) 1 % gel, Apply 4 g topically four (4) times a day as needed., Disp: , Rfl:     empty container Misc, Use as directed, Disp: 1 each, Rfl: PRN    folic acid (FOLVITE) 1 MG tablet, Take 1 tablet (1 mg total) by mouth daily., Disp: 90 tablet, Rfl: 3    ibuprofen (MOTRIN) 800 MG tablet, Take 1 tablet (800 mg total) by mouth every eight (8) hours as needed., Disp: , Rfl:     loratadine (CLARITIN) 10 mg tablet, Take 1 tablet (10 mg total) by mouth daily., Disp: , Rfl:     methotrexate sodium (METHOTREXATE, CONTAINS PRESERVATIVES,) 25 mg/mL injection solution, Inject 0.8 mL (20 mg total) under the skin every seven (7) days., Disp: 12 mL, Rfl: 1    metoprolol tartrate (LOPRESSOR) 25 MG tablet, Take 1 tablet (25 mg total) by mouth two (2) times a day., Disp: , Rfl:     naltrexone (DEPADE) 50 mg tablet, Take 1 tablet (50 mg total) by mouth daily., Disp: , Rfl:     nicotine (NICODERM CQ) 21 mg/24 hr patch, Place 21 mg on the skin., Disp: , Rfl:     simvastatin (ZOCOR) 40 MG tablet, Take 1 tablet (40 mg total) by mouth nightly., Disp: , Rfl:     syringe with needle (TUBERCULIN SYRINGE) 1 mL 25 gauge x 5/8 Syrg, Use as directed every seven (7) days. To be used with methotrexate, Disp: 50 each, Rfl: 1    Medical History:  Past Medical History:   Diagnosis Date    Alcoholism (CMS-HCC)     Anxiety     Asthma     Atrial fibrillation (CMS-HCC)     Bipolar affective (CMS-HCC)     Depression     Hepatitis C     TREATED    Hyperlipidemia     Hypertension     Joint pain     Polysubstance abuse (CMS-HCC)     RA (rheumatoid arthritis) (CMS-HCC)     Seizures (CMS-HCC) none since ~2005    Shoulder injury     Sleep apnea, obstructive        Surgical & Family History:  Available for review in EMR     Social History:  Social History  Tobacco Use    Smoking status: Every Day     Current packs/day: 1.00     Average packs/day: 1 pack/day for 38.7 years (38.7 ttl pk-yrs)     Types: Cigarettes     Start date: 05/19/1984    Smokeless tobacco: Former     Types: Snuff   Vaping Use    Vaping status: Never Used   Substance Use Topics    Alcohol use: Yes     Comment: Less than 1 drink weekly    Drug use: No       Objective   There were no vitals filed for this visit.    Physical Exam:  General: Well-appearing.  HEENT: ***Adequate tear meniscus. Sclera anicteric; PERRL. MMM. ***Adequate sublingual salivary pooling. ***No oral mucosal ulcers.  Pulmonary: Unremarkable respiratory effort. CTAB.  Cardiac: S1 and S2 present, RRR. No murmurs/rubs/gallops.  Neuro: ***ambulation to exam table, *** Moving all four extremities.  Psych: Euthymic affect.  MSK: No overt bony deformities or gross abnormalities in range of motion.   Hands: No swelling or tenderness to palpation of the MCPs, PIPs, or DIPs.  Wrists: No swelling or tenderness to palpation of the wrists or deficit in range of motion.  Elbows: No swelling or tenderness to palpation of the elbows.  Shoulders: No swelling or tenderness to palpation of the bilateral shoulders..  Hips: No swelling or tenderness to palpation of the lateral hips. ***Negative FABER testing.  Knees: No swelling or tenderness to palpation of the bilateral knees.  Ankles: No swelling or tenderness to palpation of the bilateral ankles.  Feet: No swelling or tenderness to palpation of the ankle or MTPs.  Skin: No evidence of erythema or rash.  Lymph: No cervical adenopathy  Extremities: Warm, no LE edema.

## 2023-01-22 ENCOUNTER — Ambulatory Visit: Admit: 2023-01-22 | Payer: MEDICARE

## 2023-02-07 ENCOUNTER — Encounter: Payer: MEDICAID | Attending: Physical Medicine and Rehabilitation | Admitting: Physical Medicine and Rehabilitation

## 2023-02-07 VITALS — BP 149/93 | HR 67 | Ht 68.0 in | Wt 262.0 lb

## 2023-02-07 DIAGNOSIS — M4802 Spinal stenosis, cervical region: Secondary | ICD-10-CM

## 2023-02-07 DIAGNOSIS — L989 Disorder of the skin and subcutaneous tissue, unspecified: Secondary | ICD-10-CM | POA: Diagnosis not present

## 2023-02-07 DIAGNOSIS — M25511 Pain in right shoulder: Secondary | ICD-10-CM

## 2023-02-07 DIAGNOSIS — G8929 Other chronic pain: Secondary | ICD-10-CM

## 2023-02-07 MED ORDER — CAPSAICIN-CLEANSING GEL 8 % EX KIT
1.0000 | PACK | Freq: Once | CUTANEOUS | Status: AC
Start: 2023-02-07 — End: 2023-02-07
  Administered 2023-02-07: 1 via TOPICAL

## 2023-02-07 MED ORDER — CAPSAICIN-CLEANSING GEL 8 % EX KIT
1.0000 | PACK | Freq: Once | CUTANEOUS | Status: DC
Start: 2023-02-07 — End: 2023-02-07

## 2023-02-07 NOTE — Progress Notes (Signed)
-  Discussed Qutenza as an option for neuropathic pain control. Discussed that this is a capsaicin patch, stronger than capsaicin cream. Discussed that it is currently approved for diabetic peripheral neuropathy and post-herpetic neuralgia, but that it has also shown benefit in treating other forms of neuropathy. Provided patient with link to site to learn more about the patch: https://www.clark.biz/. Discussed that the patch would be placed in office and benefits usually last 3 months. Discussed that unintended exposure to capsaicin can cause severe irritation of eyes, mucous membranes, respiratory tract, and skin, but that Qutenza is a local treatment and does not have the systemic side effects of other nerve medications. Discussed that there may be pain, itching, erythema, and decreased sensory function associated with the application of Qutenza. Side effects usually subside within 1 week. A cold pack of analgesic medications can help with these side effects. Blood pressure can also be increased due to pain associated with administration of the patch.   1patch of Qutenza [id number]) was applied to the right shoulder. Ice packs were applied during the procedure to ensure patient comfort. Blood pressure was monitored every 15 minutes. The patient tolerated the procedure well. Post-procedure instructions were given and follow-up has been scheduled.  Topical system measures 14cm x20cm (280cm for a total 1120units) were applied which will cause deeper penetration for destruction of the peripheral nerve using a chemical (Qutenza) which infuses into the skin like an injection and heat technique (occlusive, compressive dressing cauing endothermic heat technique)

## 2023-02-07 NOTE — Patient Instructions (Signed)
 HTN: -Advised checking BP daily at home and logging results to bring into follow-up appointment with PCP and myself. -Reviewed BP meds today.  -Advised regarding healthy foods that can help lower blood pressure and provided with a list: 1) citrus foods- high in vitamins and minerals 2) salmon and other fatty fish - reduces inflammation and oxylipins 3) swiss chard (leafy green)- high level of nitrates 4) pumpkin seeds- one of the best natural sources of magnesium 5) Beans and lentils- high in fiber, magnesium, and potassium 6) Berries- high in flavonoids 7) Amaranth (whole grain, can be cooked similarly to rice and oats)- high in magnesium and fiber 8) Pistachios- even more effective at reducing BP than other nuts 9) Carrots- high in phenolic compounds that relax blood vessels and reduce inflammation 10) Celery- contain phthalides that relax tissues of arterial walls 11) Tomatoes- can also improve cholesterol and reduce risk of heart disease 12) Broccoli- good source of magnesium, calcium, and potassium 13) Greek yogurt: high in potassium and calcium 14) Herbs and spices: Celery seed, cilantro, saffron, lemongrass, black cumin, ginseng, cinnamon, cardamom, sweet basil, and ginger 15) Chia and flax seeds- also help to lower cholesterol and blood sugar 16) Beets- high levels of nitrates that relax blood vessels  17) spinach and bananas- high in potassium  -Provided lise of supplements that can help with hypertension:  1) magnesium: one high quality brand is Bioptemizers since it contains all 7 types of magnesium, otherwise over the counter magnesium gluconate 400mg  is a good option 2) B vitamins 3) vitamin D 4) potassium 5) CoQ10 6) L-arginine 7) Vitamin C 8) Beetroot -Educated that goal BP is 120/80. -Made goal to incorporate some of the above foods into diet.     -Discussed current symptoms of pain and history of pain.  -Discussed benefits of exercise in reducing pain. -Discussed  following foods that may reduce pain: 1) Ginger (especially studied for arthritis)- reduce leukotriene production to decrease inflammation 2) Blueberries- high in phytonutrients that decrease inflammation 3) Salmon- marine omega-3s reduce joint swelling and pain 4) Pumpkin seeds- reduce inflammation 5) dark chocolate- reduces inflammation 6) turmeric- reduces inflammation 7) tart cherries - reduce pain and stiffness 8) extra virgin olive oil - its compound olecanthal helps to block prostaglandins  9) chili peppers- can be eaten or applied topically via capsaicin 10) mint- helpful for headache, muscle aches, joint pain, and itching 11) garlic- reduces inflammation  Link to further information on diet for chronic pain: [email or web address]

## 2023-02-07 NOTE — Addendum Note (Signed)
Addended by: Sydnee Cabal D on: 02/07/2023 10:27 AM   Modules accepted: Orders

## 2023-02-07 NOTE — Progress Notes (Signed)
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 unique benefits. Magnesium can also help with constipation and anxiety.  -Pistachios naturally increase the production of melatonin -[employer] bamboo bed sheets are free from toxic chemicals.  -Tart cherry juice or a tart cherry supplement can improve sleep and soreness post-workout  12) Active smoker: -continue naltrexone   40 minutes spent in discussion of risks and benefits of Qutenza and obtaining informed consent, discussion of q90 day follow-up and expectation of improvement in pain with each repeat application, discussed application of Qutenza for only 15 minutes given that it burns him, discussed that he would like to  get right shoulder surgery before going to [place] to see his fiance, discussed that blood pressure is elevated today, recommended starting magnesium supplement

## 2023-03-04 NOTE — Progress Notes (Unsigned)
    SUBJECTIVE:   CHIEF COMPLAINT / HPI:   Atrial fibrillation, chest pressure Patient reports ongoing episodes of what he deems A-fib.  Reports he "knows what it feels like to be in A-fib" and feels palpitations and chest pressure.  Occurring at least every other day including with activity and at rest.  Last episode of chest pain was 3 nights ago and he "thought he was having heart attack" and it took around 10 minutes to go away.  Not currently on anticoagulation, per chart review appears to be related to past alcohol use and concern for falls.  History of OSA, has been noncompliant with CPAP for over 4 years.  States he is unable to wear device due to "feeling claustrophobic".  Prior history of polysubstance use has now recurred with persistent alcohol use.  Now drinking around 1 pint of liquor every day due to social stressors.  Has a history of withdrawal seizure and DTs.  Very serious about wanting to get off alcohol and restart naltrexone.  Currently smoking 1 pack/day, history of aortic aneurysm.  PERTINENT  PMH / PSH: HTN, A-fib, polysubstance use, tobacco use  OBJECTIVE:   BP (!) 152/99   Pulse 76   Ht 5\' 8"  (1.727 m)   Wt 265 lb 3.2 oz (120.3 kg)   SpO2 98%   BMI 40.32 kg/m    General: NAD, pleasant, able to participate in exam Cardiac: RRR, no murmurs. Respiratory: CTAB, normal effort, No wheezes, rales or rhonchi Abdomen: Bowel sounds present, nontender, nondistended Extremities: no edema or cyanosis. Skin: warm and dry, no rashes noted Neuro: alert, no obvious focal deficits Psych: Normal affect and mood  ASSESSMENT/PLAN:   Assessment & Plan Atrial fibrillation, unspecified type [facility]) Per chart review appears to be diagnosed in 2008, underwent cardioversion previously and now just on metoprolol 25 mg twice daily for management.  Has not seen cardiology in years, needs to be reestablished.  Not anticoagulated due to alcohol use and concern for falls per chart  review.  EKG in the office with normal rhythm and rate control but given episodes of recurrent chest pressure and palpitations concern patient is going into A-fib with RVR intermittently. -Assisted patient to the ED for further evaluation Chest pain, unspecified type Intermittent episodes of chest pressure both with activity and at rest concerning for A-fib vs unstable angina.  EKG did not show evidence of ST elevation but patient is high risk for cardiac event.  ED evaluation as above Alcohol use disorder, severe, dependence (HCC) Now drinking 1 pint of liquor daily, desires to stop using alcohol but history of withdrawal seizure and DTs previously.  Was on naltrexone that was "stolen" but would like to restart once safely detox. OSA (obstructive sleep apnea) Noncompliant with CPAP for years, likely contributing to A-fib recurrence and hypertension.  After hospital evaluation will likely need repeat sleep study and management.     Dr. Elberta Fortis, DO [facility]

## 2023-03-05 ENCOUNTER — Encounter (HOSPITAL_COMMUNITY): Payer: Self-pay | Admitting: Emergency Medicine

## 2023-03-05 ENCOUNTER — Emergency Department (HOSPITAL_COMMUNITY): Payer: MEDICAID

## 2023-03-05 ENCOUNTER — Encounter: Payer: Self-pay | Admitting: Family Medicine

## 2023-03-05 ENCOUNTER — Ambulatory Visit (INDEPENDENT_AMBULATORY_CARE_PROVIDER_SITE_OTHER): Payer: MEDICAID | Admitting: Family Medicine

## 2023-03-05 ENCOUNTER — Other Ambulatory Visit: Payer: Self-pay

## 2023-03-05 ENCOUNTER — Emergency Department (HOSPITAL_COMMUNITY)
Admission: EM | Admit: 2023-03-05 | Discharge: 2023-03-05 | Disposition: A | Discharge status: Home / Self Care | Payer: MEDICAID | Attending: Emergency Medicine | Admitting: Emergency Medicine

## 2023-03-05 VITALS — BP 152/99 | HR 76 | Ht 68.0 in | Wt 265.2 lb

## 2023-03-05 DIAGNOSIS — R002 Palpitations: Secondary | ICD-10-CM | POA: Diagnosis present

## 2023-03-05 DIAGNOSIS — F17210 Nicotine dependence, cigarettes, uncomplicated: Secondary | ICD-10-CM | POA: Insufficient documentation

## 2023-03-05 DIAGNOSIS — F1020 Alcohol dependence, uncomplicated: Secondary | ICD-10-CM

## 2023-03-05 DIAGNOSIS — G4733 Obstructive sleep apnea (adult) (pediatric): Secondary | ICD-10-CM | POA: Diagnosis not present

## 2023-03-05 DIAGNOSIS — R079 Chest pain, unspecified: Secondary | ICD-10-CM

## 2023-03-05 DIAGNOSIS — I4891 Unspecified atrial fibrillation: Secondary | ICD-10-CM

## 2023-03-05 LAB — CBC
HCT: 49.4 % (ref 39.0–52.0)
Hemoglobin: 17.4 g/dL — ABNORMAL HIGH (ref 13.0–17.0)
MCH: 33.3 pg (ref 26.0–34.0)
MCHC: 35.2 g/dL (ref 30.0–36.0)
MCV: 94.5 fL (ref 80.0–100.0)
Platelets: 264 10*3/uL (ref 150–400)
RBC: 5.23 MIL/uL (ref 4.22–5.81)
RDW: 12.2 % (ref 11.5–15.5)
WBC: 8.7 10*3/uL (ref 4.0–10.5)
nRBC: 0 % (ref 0.0–0.2)

## 2023-03-05 LAB — COMPREHENSIVE METABOLIC PANEL
ALT: 48 U/L — ABNORMAL HIGH (ref 0–44)
AST: 29 U/L (ref 15–41)
Albumin: 3.8 g/dL (ref 3.5–5.0)
Alkaline Phosphatase: 61 U/L (ref 38–126)
Anion gap: 8 (ref 5–15)
BUN: 15 mg/dL (ref 6–20)
CO2: 24 mmol/L (ref 22–32)
Calcium: 9.1 mg/dL (ref 8.9–10.3)
Chloride: 102 mmol/L (ref 98–111)
Creatinine, Ser: 0.81 mg/dL (ref 0.61–1.24)
GFR, Estimated: >60 mL/min (ref >60)
Glucose, Bld: 125 mg/dL — ABNORMAL HIGH (ref 70–99)
Potassium: 4.1 mmol/L (ref 3.5–5.1)
Sodium: 134 mmol/L — ABNORMAL LOW (ref 135–145)
Total Bilirubin: 1 mg/dL (ref 0.0–1.2)
Total Protein: 7.8 g/dL (ref 6.5–8.1)

## 2023-03-05 LAB — TROPONIN I (HIGH SENSITIVITY): Troponin I (High Sensitivity): 7 ng/L (ref <18)

## 2023-03-05 LAB — ETHANOL: Alcohol, Ethyl (B): <10 mg/dL (ref <10)

## 2023-03-05 MED ORDER — CHLORDIAZEPOXIDE HCL 25 MG PO CAPS: ORAL_CAPSULE | ORAL | 0 refills | Status: DC

## 2023-03-05 NOTE — Assessment & Plan Note (Signed)
Noncompliant with CPAP for years, likely contributing to A-fib recurrence and hypertension.  After hospital evaluation will likely need repeat sleep study and management.

## 2023-03-05 NOTE — ED Provider Triage Note (Signed)
Emergency Medicine Provider Triage Evaluation Note  Blake Arnold , a 53 y.o. male  was evaluated in triage.  Pt complains of palpitations, chest pain, request for detox.  Review of Systems  Positive: Daily alcohol use, chest pains, palpitations Negative: Lightheadedness, seizure, tingling, sob  Physical Exam  BP (!) 162/104 (BP Location: Left Arm)   Pulse 72   Temp 98.6 F (37 C) (Oral)   Resp 18   Ht 5\' 8"  (1.727 m)   Wt 120.2 kg   SpO2 94%   BMI 40.29 kg/m  Gen:   Awake, no distress   Resp:  Normal effort  MSK:   Moves extremities without difficulty  Other:  No tremor   Medical Decision Making  Medically screening exam initiated at 11:47 AM.  Appropriate orders placed.  [name] was informed that the remainder of the evaluation will be completed by another provider, this initial triage assessment does not replace that evaluation, and the importance of remaining in the ED until their evaluation is complete.     Lonell Grandchild, MD 03/05/23 [phone]

## 2023-03-05 NOTE — ED Provider Notes (Addendum)
[place] EMERGENCY DEPARTMENT AT [facility] Provider Note   CSN: [id number] Arrival date & time: 03/05/23  1032     History  Chief Complaint  Patient presents with   Palpitations   Alcohol Problem    Blake Arnold is a 53 y.o. male history of polysubstance abuse, alcohol dependence, AAA, A-fib not currently anticoagulated due to falls from alcohol use, seizures, hepatitis C presented for palpitations with interest in medications for alcohol withdrawal.  Patient states he does have history of DT and seizures.  Patient states that he he would like to be given a Librium taper as he is serious about decreasing alcohol intake.  Patient dates for the past 8 months he has had a glass of whiskey daily with his last drink being 10 PM last night.  Patient states that he feels that he goes into A-fib however this been going on for few months.  Patient notices this when he exerts himself.  Patient denies any shortness of breath with this.  Patient does have OSA however does not use a CPAP machine.  Patient denies any tremors.  Patient states he does smoke cigarettes.  Home Medications Prior to Admission medications   Medication Sig Start Date End Date Taking? Authorizing Provider  chlordiazePOXIDE (LIBRIUM) 25 MG capsule 50mg  PO TID x 1D, then 25-50mg  PO BID X 1D, then 25-50mg  PO QD X 1D 03/05/23  Yes Yarexi Pawlicki, Beverly Gust, PA-C  albuterol (VENTOLIN HFA) 108 (90 Base) MCG/ACT inhaler Inhale 1-2 puffs into the lungs every 6 (six) hours as needed for wheezing or shortness of breath. 05/07/22   Dameron, Nolberto Hanlon, DO  amLODipine (NORVASC) 10 MG tablet Take 1 tablet (10 mg total) by mouth daily. 05/07/22   Dameron, Nolberto Hanlon, DO  azithromycin (ZITHROMAX) 250 MG tablet Take 1 tablet (250 mg total) by mouth daily. Take first 2 tablets together, then 1 every day until finished. 01/17/23   Garrison, [place] N, FNP  diclofenac Sodium (VOLTAREN) 1 % GEL Apply 2 g topically 4 (four) times daily. 11/07/22    Raulkar, Drema Pry, MD  famotidine (PEPCID) 20 MG tablet Take 1 tablet (20 mg total) by mouth daily. 09/12/22   Elberta Fortis, MD  folic acid (FOLVITE) 1 MG tablet Take 1 tablet by mouth daily. 07/12/22 07/12/23  [provider]  methocarbamol (ROBAXIN) 500 MG tablet Take 1 tablet (500 mg total) by mouth 2 (two) times daily. 09/18/22   Garrison, [place] N, FNP  Methotrexate, Anti-Rheumatic, (METHOTREXATE [place]) Inject into the skin. 07/12/22   [provider]  metoprolol tartrate (LOPRESSOR) 25 MG tablet Take 1 tablet (25 mg total) by mouth 2 (two) times daily. 05/05/22   White, Patrice L, NP  naltrexone (DEPADE) 50 MG tablet Take 1 tablet (50 mg total) by mouth daily. 05/06/22   White, Patrice L, NP  promethazine-dextromethorphan (PROMETHAZINE-DM) 6.25-15 MG/5ML syrup Take 5 mLs by mouth 4 (four) times daily as needed for cough. 01/17/23   Garrison, [place] N, FNP  TUBERCULIN SYR 1CC/25GX5/8" 25G X 5/8" 1 ML MISC Use as directed every seven (7) days. To be used with methotrexate 07/12/22   [provider]      Allergies    Lisinopril    Review of Systems   Review of Systems  Cardiovascular:  Positive for palpitations.    Physical Exam Updated Vital Signs BP (!) 162/104 (BP Location: Left Arm)   Pulse 72   Temp 98.6 F (37 C) (Oral)   Resp 18   Ht  5\' 8"  (1.727 m)   Wt 120.2 kg   SpO2 94%   BMI 40.29 kg/m  Physical Exam Vitals reviewed.  Constitutional:      General: He is not in acute distress. HENT:     Head: Normocephalic and atraumatic.  Eyes:     Extraocular Movements: Extraocular movements intact.     Conjunctiva/sclera: Conjunctivae normal.     Pupils: Pupils are equal, round, and reactive to light.  Cardiovascular:     Rate and Rhythm: Normal rate and regular rhythm.     Pulses: Normal pulses.     Heart sounds: Normal heart sounds.     Comments: 2+ bilateral radial/dorsalis pedis pulses with regular rate Pulmonary:     Effort: Pulmonary effort is  normal. No respiratory distress.     Breath sounds: Normal breath sounds.  Abdominal:     Palpations: Abdomen is soft.     Tenderness: There is no abdominal tenderness. There is no guarding or rebound.  Musculoskeletal:        General: Normal range of motion.     Cervical back: Normal range of motion and neck supple.     Comments: 5 out of 5 bilateral grip/leg extension strength  Skin:    General: Skin is warm and dry.     Capillary Refill: Capillary refill takes less than 2 seconds.  Neurological:     General: No focal deficit present.     Mental Status: He is alert and oriented to person, place, and time.     Comments: Sensation intact in all 4 limbs No tongue fasciculations noted or tremors  Psychiatric:        Mood and Affect: Mood normal.     ED Results / Procedures / Treatments   Labs (all labs ordered are listed, but only abnormal results are displayed) Labs Reviewed  COMPREHENSIVE METABOLIC PANEL - Abnormal; Notable for the following components:      Result Value   Sodium 134 (*)    Glucose, Bld 125 (*)    ALT 48 (*)    All other components within normal limits  CBC - Abnormal; Notable for the following components:   Hemoglobin 17.4 (*)    All other components within normal limits  ETHANOL  RAPID URINE DRUG SCREEN, HOSP PERFORMED  TROPONIN I (HIGH SENSITIVITY)  TROPONIN I (HIGH SENSITIVITY)    EKG None  Radiology DG Chest 2 View Result Date: 03/05/2023 CLINICAL DATA:  Atrial fibrillation EXAM: CHEST - 2 VIEW COMPARISON:  01/17/2019 FINDINGS: Improvement in the perihilar interstitial edema or infiltrates seen previously, with mild residual left greater than right. No new infiltrate. Heart size and mediastinal contours are within normal limits. Aortic Atherosclerosis (ICD10-170.0). No effusion. Cervical fixation hardware partially visualized. IMPRESSION: Improvement in perihilar interstitial edema or infiltrates. Electronically Signed   By: Corlis Leak M.D.   On:  03/05/2023 11:38    Procedures Procedures    Medications Ordered in ED Medications - No data to display  ED Course/ Medical Decision Making/ A&P                                 Medical Decision Making Amount and/or Complexity of Data Reviewed Labs: ordered. Radiology: ordered.  Risk Prescription drug management.   [name] 53 y.o. presented today for palpitations and alcohol withdrawal. Working DDx that I considered at this time includes, but not limited to, ACS, pulmonary embolism, community-acquired  pneumonia, aortic dissection, pneumothorax, underlying bony abnormality, anemia, thyrotoxicosis, HTN urgency/emergency, esophageal rupture, CHF exacerbation, valvular disorder, myocarditis, pericarditis, endocarditis, pericardial effusion/cardiac tamponade, pulmonary edema, gastritis/PUD/GERD, esophagitis, MSK, DT, alcohol withdrawal seizures.  R/o Dx: ACS, pulmonary embolism, community-acquired pneumonia, aortic dissection, pneumothorax, underlying bony abnormality, anemia, thyrotoxicosis, HTN urgency/emergency, esophageal rupture, CHF exacerbation, valvular disorder, myocarditis, pericarditis, endocarditis, pericardial effusion/cardiac tamponade, pulmonary edema, gastritis/PUD/GERD, esophagitis, MSK, DT, alcohol drawl seizures: These are considered less likely due to history of present illness and physical exam findings. Aortic Dissection: less likely based on the location, quality, onset, and severity of symptoms in this case. Patient also has a lack of underlying history of AD or TAA.   Review of prior external notes: 03/05/2023 office visit  Unique Tests and My Interpretation:  EKG: Rate, rhythm, axis, intervals all examined and without medically relevant abnormality. ST segments without concerns for elevations Troponin: 7 CXR: No acute findings CBC: Unremarkable BMP: Unremarkable  Social Determinants of Health: none  Discussion with Independent Historian:  None  Discussion of Management of Tests: None  Risk: Medium: prescription drug management  Risk Stratification Score: none  Staffed with Belfi, MD  Plan: On exam patient was in no acute distress with stable vitals. Patient's physical was largely unremarkable as he does not have any tremors or signs of alcohol withdrawal.  So far labs and imaging from triage are ultimately reassuring and attending reviewed the EKG that shows he is not in A-fib as he is in sinus with normal rate.  Patient stated that he was interested in a Librium taper and at this time do feel this is reasonable as we are within 24 hours of his last alcohol intake and feel this is too early for DT or alcohol seizures.  I spoke to the patient about avoiding alcohol while on Librium and patient verbalized understanding agreement of this. After discussion with the attending we agreed this is reasonable as patient's labs are reassuring.  Patient does not want to wait for delta troponin only wants to be discharged on Librium and to follow-up with his primary care in the outpatient setting.  Patient's symptoms have been going on for a few months now and so 1 troponin is reasonable as per the patient's request.  I spoke to the patient about following up with primary care provider for Holter monitor if he feels that he is going into A-fib.  Patient was given return precautions. Patient stable for discharge at this time.  Patient verbalized understanding of plan.  Discussed smoking cessation with patient and was they were offerred resources to help stop.  Total time was 5 min CPT code 96045.   This chart was dictated using voice recognition software.  Despite best efforts to proofread,  errors can occur which can change the documentation meaning.  Final Clinical Impression(s) / ED Diagnoses Final diagnoses:  Palpitations    Rx / DC Orders ED Discharge Orders          Ordered    chlordiazePOXIDE (LIBRIUM) 25 MG capsule         03/05/23 1332              Remi Deter 03/05/23 1352    Rolan Bucco, MD 03/05/23 1444

## 2023-03-05 NOTE — Discharge Instructions (Addendum)
Please follow-up with your primary care provider and read the information below in regards to recent ER visit.  Today your labs and imaging were reassuring and you are not in A-fib during your stay.  You will need to speak with your primary care provider about Holter monitor to see if you are going into A-fib.  You have a very complex syndrome related to your alcohol use, We have had prolonged discussions today regarding alcohol withdrawals and you have expressed interest in starting a Librium taper and completely stopping alcohol use.   A Librium taper is a slowly decreasing medication which will help control your withdrawal symptoms.  Please note this medication has a variable effects on each person and you will have to change the amount of medication you are taking.  Only take the medicine when you feel the onset of withdrawals.  Withdrawals include symptoms of heart racing or tachycardia, agitation or anxiety, headaches, nausea or vomiting.  You may take the medicine as prescribed and the taper when you are having the above symptoms.   You are likely to need more medications at the beginning that you will at the end of the taper.  Start with 2 capsules (50 mg) as frequent as every 6 hours as needed for up to 2 days.  Then decrease to 2 capsules every 8 hours for up to 2 days, then decrease to 1 capsule every 8 hours for up to 2 days, then decrease to 1 capsule every 12 hours for up to 2 days then decrease to 1 capsule daily for 2 days.  The medication is only to be taken as needed.  If you are not having the above withdrawal symptoms, you may be more aggressive with your taper.   The taper does not work for every patient, some patients need more medication than can be safely prescribed in the outpatient setting.  If the Librium is not controlling your alcohol withdrawal syndrome you will need to return to the hospital for more aggressive interventions and possible admission.  If you begin to drink while  taking the Librium, you should completely stop taking the Librium as there is a risk for overdose if used in the context of alcohol use.   I recommend you give the medication to a trusted family member who may distribute the medicine to you on an "as needed" basis and can stop the distribution if you relapse.  Please follow up with a local Psychiatric provider.  Thank you for the opportunity to take part of your care, please return if you are having any problems with the medication or your ongoing symptoms.

## 2023-03-05 NOTE — ED Triage Notes (Signed)
PT sent from PCP for evaluation for AFIB and to help detox from alcohol. Pt states his last drink was yesterday. Typically drinks a pint of whiskey a day. Pt denies any Chest pain or SOB.

## 2023-03-05 NOTE — Assessment & Plan Note (Signed)
Per chart review appears to be diagnosed in 2008, underwent cardioversion previously and now just on metoprolol 25 mg twice daily for management.  Has not seen cardiology in years, needs to be reestablished.  Not anticoagulated due to alcohol use and concern for falls per chart review.  EKG in the office with normal rhythm and rate control but given episodes of recurrent chest pressure and palpitations concern patient is going into A-fib with RVR intermittently. -Assisted patient to the ED for further evaluation

## 2023-03-05 NOTE — Patient Instructions (Signed)
It was wonderful to see you today! Thank you for choosing [facility].   Please bring ALL of your medications with you to every visit.   Today we talked about:  Please proceed to the emergency department for further evaluation.  I do think you need close observation to withdrawal from alcohol given you have a history of seizures and DTs.  I also think your ongoing concern for A-fib and chest pressure needs very close monitoring as withdrawal puts extra strain on your heart.  Please come back to our clinic after you are seen there and we can discuss weight loss and better management of your OSA further.  Please follow up in our clinic within a week after leaving the hospital  Call the clinic at [phone] if your symptoms worsen or you have any concerns.  Please be sure to schedule follow up at the front desk before you leave today.   Elberta Fortis, DO Family Medicine

## 2023-04-01 ENCOUNTER — Ambulatory Visit: Payer: MEDICAID | Admitting: Dermatology

## 2023-04-04 ENCOUNTER — Ambulatory Visit: Payer: MEDICAID | Admitting: Family Medicine

## 2023-04-04 ENCOUNTER — Encounter: Payer: Self-pay | Admitting: Family Medicine

## 2023-04-04 VITALS — BP 125/80 | HR 80 | Temp 97.8°F | Ht 68.0 in | Wt 261.6 lb

## 2023-04-04 DIAGNOSIS — M25469 Effusion, unspecified knee: Secondary | ICD-10-CM | POA: Diagnosis not present

## 2023-04-04 MED ORDER — NAPROXEN 500 MG PO TABS: 500.0000 mg | ORAL_TABLET | Freq: Two times a day (BID) | ORAL | 0 refills | Status: DC

## 2023-04-04 NOTE — Patient Instructions (Addendum)
It was great to see you today! Thank you for choosing [facility] for your primary care.  Today we addressed:  Left knee swelling Please go to the Hospital Entrance A during daytime hours to get your knee Xray done, I will let you know the results afterwards. Please ice the knee at least twice per day.  You can elevate it to reduce swelling. Please take naproxen twice a day, but make sure to take your Pepcid with it to protect your stomach. Please DO NOT drink alcohol with the naproxen, this is very important to reduce risk of stomach bleeding and damage. You can also do the topical Voltaren gel. You had a Sports Medicine referral placed, they will call you to set up an appointment.  You can also call them directly to schedule an appointment at [phone].  Please continue this treatment plan until follow up with either [place] or Sports Medicine. I am giving you a note to keep you out of heavy duty work for 2 weeks.  You should return to our clinic on 04/15/2023 in 2 weeks to follow up on your progress and to determine next steps.  Thank you for coming to see [place] at [facility] and for the opportunity to care for you! Nevia Henkin, MD 04/04/2023, 4:49 PM

## 2023-04-04 NOTE — Progress Notes (Signed)
SUBJECTIVE:   CHIEF COMPLAINT / HPI:  Blake Arnold is a 53 y.o. male with a pertinent past medical history of COPD, DJD, osteoarthritis, RA on methotrexate, carpal tunnel syndrome, polysubstance use disorder, neuropathy, bipolar disorder, and MDD presenting to the clinic for L hip and L knee swelling.  L knee edema and pain Started new job on Monday and has to climb up and down ladders all day.  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 Noted progressive L knee pain and edema starting on Tuesday. Experienced significant L knee edema on Wednesday evening with significant pop upon standing up associated with pain.  Tried soaking in epsom salts with some mild improvement.  Reports swelling has reduced since Wednesday. Did not go to work Thursday (yesterday) due to edema and pain, but was unable to come to clinic due to closures in setting of winter weather. Does not like taking medicines and has not taken anything for this specifically.  Is keeping up with methotrexate shots.  Has not used Voltaren in this area. Reports difficulty weight bearing and limping gait.  Comfortable at rest. Denies falls or specific injuries.  No history of prior L knee injuries or surgeries.   PERTINENT PMH / PSH: Bilateral AVN with R hip replacement, DJD, osteoarthritis, RA on methotrexate, carpal tunnel syndrome, polysubstance use disorder, neuropathy, bipolar disorder, and MDD  Went to ED 03/05/2023 with palpitations from [facility] and discharged on Librium taper. Did not finish Librium taper. Used to drink 1 pint of liquor daily with history of withdrawal seizures and DTs. Cut down to 1/2 ppd and drinking 1 pint a day on weekends only. Not compliant with Pepcid at baseline.   OBJECTIVE:   BP 125/80   Pulse 80   Temp 97.8 F (36.6 C)   Ht 5\' 8"  (1.727 m)   Wt 261 lb 9.6 oz (118.7 kg)   SpO2 97%   BMI 39.78 kg/m   General: Age-appropriate, resting comfortably in chair, NAD, alert and at  baseline. Cardiovascular: Regular rate and rhythm. Normal S1/S2. No murmurs, rubs, or gallops appreciated. 2+ radial pulses. Pulmonary: Clear bilaterally to ascultation. No wheezes, crackles, or rhonchi. No increased WOB, no accessory muscle usage on room air. Abdominal: No tenderness to deep or light palpation. No rebound or guarding. Extremities: No peripheral edema bilaterally. 2+ DP and PT pulses bilaterally. Capillary refill <2 seconds. MSK: Moderate L knee edema without calor to touch, surrounding erythema, or visible contusions. Notable inferopatellar tenderness to palpation on medial and lateral sides as well as directly inferior. No suprapatellar tenderness and minimal edema. Knee stable to varus and valgus strain, negative posterior and anterior drawer tests. Active ROM limited by pain >120 degrees with knee extension. Limping gait with incomplete weight bearing on L leg.  POCUS with Dr. Manson Passey: Mild subpatellar edema, no marked lateral or medial fluid accumulation. Mild osteoarthritic changes. No obvious fractures in patella. Unable to fully visualize medial and lateral meniscus space, but no tears noted on limited exam. No joint effusion visualized.   ASSESSMENT/PLAN:   Assessment & Plan Edema of knee Differential includes prepatellar bursitis, meniscus tear, and patellar tendinitis.  No evidence of traumatic injury or large joint effusion, inconsistent with fracture.  Physical exam inconsistent with ACL/PCL or LCL/MCL injury given excellent knee stability.  Given acute onset of edema and pain as well as reduced weight bearing capability, XR knee is indicated and follow up with Sports Med appropriate. -XR 4 view L knee ordered -Naproxen 500 mg  BID until reevaluated, topical Voltaren QID -Ensure compliance with famotidine and absolute contraindication to alcohol while on NSAID reviewed -Recommended elevating knee at rest and icing minimum 2x daily, consider ACE bandage for compression if  ambulating -Work note for off duty for 2 weeks pending reevaluation in office -Urgent Sports Med referral, clinic phone number provided -Follow up in 2 weeks with PCP (scheduled 04/15/2023)  Chaysen Tillman Sharion Dove, MD [facility]

## 2023-04-08 ENCOUNTER — Ambulatory Visit (HOSPITAL_COMMUNITY)
Admission: RE | Admit: 2023-04-08 | Discharge: 2023-04-08 | Disposition: A | Discharge status: Home / Self Care | Payer: MEDICAID | Source: Ambulatory Visit | Attending: Family Medicine | Admitting: Family Medicine

## 2023-04-08 DIAGNOSIS — M25469 Effusion, unspecified knee: Secondary | ICD-10-CM | POA: Insufficient documentation

## 2023-04-14 NOTE — Progress Notes (Deleted)
    SUBJECTIVE:   CHIEF COMPLAINT / HPI:   *Low dose CT  Afib - Zio patch?  Chest pain - Cardiology referral  PERTINENT  PMH / PSH: ***  OBJECTIVE:   There were no vitals taken for this visit. ***  General: NAD, pleasant, able to participate in exam Cardiac: RRR, no murmurs. Respiratory: CTAB, normal effort, No wheezes, rales or rhonchi Abdomen: Bowel sounds present, nontender, nondistended Extremities: no edema or cyanosis. Skin: warm and dry, no rashes noted Neuro: alert, no obvious focal deficits Psych: Normal affect and mood  ASSESSMENT/PLAN:   No problem-specific Assessment & Plan notes found for this encounter.     Dr. Elberta Fortis, DO [facility]    {    This will disappear when note is signed, click to select method of visit    :1}

## 2023-04-15 ENCOUNTER — Ambulatory Visit: Payer: MEDICAID | Admitting: Family Medicine

## 2023-04-17 ENCOUNTER — Ambulatory Visit: Payer: MEDICAID | Admitting: Family Medicine

## 2023-04-17 ENCOUNTER — Encounter: Payer: Self-pay | Admitting: Family Medicine

## 2023-04-17 VITALS — BP 145/96 | Ht 68.0 in | Wt 263.0 lb

## 2023-04-17 DIAGNOSIS — M069 Rheumatoid arthritis, unspecified: Secondary | ICD-10-CM | POA: Diagnosis not present

## 2023-04-17 DIAGNOSIS — M8700 Idiopathic aseptic necrosis of unspecified bone: Secondary | ICD-10-CM

## 2023-04-17 DIAGNOSIS — M1712 Unilateral primary osteoarthritis, left knee: Secondary | ICD-10-CM

## 2023-04-17 NOTE — Assessment & Plan Note (Addendum)
 Hx AVN of b/l hips, s/p LT THA due to unknown cause, although patient is a smoker. -Now with acute left knee pain concerning for possible AVN versus flare of osteoarthritis versus meniscal tear  Plan: -MRI left knee STAT to rule out AVN versus meniscus tear versus other abnormality -Should discontinue Naprosyn due to GI discomfort.  Recommended Voltaren gel every 6 hours as needed -Should start using knee sleeve -Follow-up pending MRI results to discuss further treatment which could include PT, cortisone injection, referral to surgeon

## 2023-04-17 NOTE — Progress Notes (Signed)
 DATE OF VISIT: 04/17/2023        Blake Arnold DOB: Jan 20, 1971 MRN: [id number]  CC:  Lt knee pain  History- Blake Arnold is a 53 y.o.  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  He has not tried Voltaren gel yet He just purchased a knee sleeve the other day, he has not started using it but plans to do so He reports a history of AVN in his hip of uncertain etiology.  He is a prior smoker and does have underlying RA, but was never on chronic steroids.  Did require left hip replacement due to AVN Ended up quitting his job that he started 03/31/2023 due to pain and all the work on a ladder that was required   Past Medical History Past Medical History:  Diagnosis Date   A-fib [facility])    Abdominal aortic aneurysm (AAA) (HCC)    34mm, infrarenal in 2024. 23yr follow up surviellence   Acid reflux    Alcohol abuse    Allergy    seasonal   Anxiety    Asthma    AVN (avascular necrosis of bone) (HCC)    Blood transfusion without reported diagnosis    "during surgery"   Chronic back pain    COPD (chronic obstructive pulmonary disease) (HCC)    Depression    Hepatitis C    history of   History of urinary frequency    Hyperlipidemia    Hypertension    Peripheral neuropathy    hands and feet   Polysubstance abuse (HCC) 02/09/2010   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HCC)    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HCC)    Seizures (HCC)    15 years ago   Sleep apnea     Past Surgical  History Past Surgical History:  Procedure Laterality Date   APPENDECTOMY     CARDIOVERSION  03/07/2006   CARPAL TUNNEL RELEASE     CERVICAL FUSION     LAPAROSCOPIC APPENDECTOMY N/A 07/23/2017   Procedure: APPENDECTOMY LAPAROSCOPIC;  Surgeon: Violeta Gelinas, MD;  Location: [facility] OR;  Service: General;  Laterality: N/A;   TOTAL HIP ARTHROPLASTY Right 2016    Medications Current Outpatient Medications  Medication Sig Dispense Refill   albuterol (VENTOLIN HFA) 108 (90 Base) MCG/ACT inhaler Inhale 1-2 puffs into the lungs every 6 (six) hours as needed for wheezing or shortness of breath. 18 g 0   chlordiazePOXIDE (LIBRIUM) 25 MG capsule 50mg  PO TID x 1D, then 25-50mg  PO BID X 1D, then 25-50mg  PO QD X 1D (Patient not taking: Reported on 04/04/2023) 10 capsule 0   diclofenac Sodium (VOLTAREN) 1 % GEL Apply 2 g topically 4 (four) times daily. 50 g 3   famotidine (PEPCID) 20 MG tablet Take 1 tablet (20 mg total) by mouth daily. 30 tablet 2   folic acid (FOLVITE) 1 MG tablet Take 1 tablet by mouth daily.     methocarbamol (ROBAXIN) 500 MG tablet Take 1 tablet (500  mg total) by mouth 2 (two) times daily. (Patient not taking: Reported on 04/04/2023) 20 tablet 0   Methotrexate, Anti-Rheumatic, (METHOTREXATE [place]) Inject into the skin.     metoprolol tartrate (LOPRESSOR) 25 MG tablet Take 1 tablet (25 mg total) by mouth 2 (two) times daily.     naltrexone (DEPADE) 50 MG tablet Take 1 tablet (50 mg total) by mouth daily. (Patient not taking: Reported on 04/04/2023)     naproxen (NAPROSYN) 500 MG tablet Take 1 tablet (500 mg total) by mouth 2 (two) times daily with a meal. 60 tablet 0   No current facility-administered medications for this visit.    Allergies is allergic to lisinopril.  Family History - reviewed per EMR and intake form  Social History   reports that he does not currently use alcohol.  reports that he has been smoking cigarettes. He started smoking about 39 years ago. He has a 39.2  pack-year smoking history. He has been exposed to tobacco smoke. He quit smokeless tobacco use about 10 years ago.  reports that he does not currently use drugs after having used the following drugs: "Crack" cocaine and Cocaine. OCCUPATION: builds generators in stadiums   EXAM: Vitals: BP (!) 145/96   Ht 5\' 8"  (1.727 m)   Wt 263 lb (119.3 kg)   BMI 39.99 kg/m  General: AOx3, NAD, pleasant SKIN: no rashes or lesions, skin clean, dry, intact MSK: Knee: Left knee with trace effusion, no increased redness or warmth.  Near full range of motion with pain at terminal flexion and extension.  Tender to palpation along the medial and lateral joint lines, also mildly tender to palpation over the tibial tubercle.  Positive McMurray with pain and palpable click.  Negative Lachman, negative varus/valgus stress. Right knee with full range of motion without pain, weakness instability. Walking with antalgic gait  NEURO: sensation intact to light touch extremity bilaterally VASC: pulses 2+ and symmetric DP/PT bilaterally, no edema  IMAGING: XRAYS:  LT KNEE XR 4-views AP/Lateral/Obliques 04/08/23 personally reviewed and interpreted by me today showing: - fragmentation of the tibial tubercle c/w prior Osgood Schlatters - medial joint space narrowing with marginal spurring c/w mild OA - mild PF spurring  Assessment & Plan Primary osteoarthritis of left knee Acute left knee pain with x-ray showing mild osteoarthritis in the medial and patellofemoral compartments.  Also does have some fragmentation of the tibial tubercle consistent with prior Osgood slaughters when he was a teenager -On exam he has joint line tenderness, positive McMurray and he has a history of AVN.  DDx to include: Exacerbation of underlying osteoarthritis, meniscus tear, AVN, other abnormality  Plan: -Personally reviewed notes from PCP visit as noted in the HPI -Reviewed x-rays as noted above -MRI left knee STAT to rule out AVN versus  meniscus tear versus other abnormality -Should discontinue Naprosyn due to GI discomfort.  Recommended Voltaren gel every 6 hours as needed -Should start using knee sleeve -Follow-up pending MRI results to discuss further treatment which could include PT, cortisone injection, referral to surgeon Rheumatoid arthritis involving both hands, unspecified whether rheumatoid factor present (HCC) - on Methotrexate -no prior involvement of the left knee -No prior long-term oral steroids  Plan: -Continue methotrexate as prescribed AVN (avascular necrosis of bone) (HCC) Hx AVN of b/l hips, s/p LT THA due to unknown cause, although patient is a smoker. -Now with acute left knee pain concerning for possible AVN versus flare of osteoarthritis versus meniscal tear  Plan: -MRI left knee STAT  to rule out AVN versus meniscus tear versus other abnormality -Should discontinue Naprosyn due to GI discomfort.  Recommended Voltaren gel every 6 hours as needed -Should start using knee sleeve -Follow-up pending MRI results to discuss further treatment which could include PT, cortisone injection, referral to surgeon   Patient expressed understanding & agreement with above.  Encounter Diagnoses  Name Primary?   Primary osteoarthritis of left knee Yes   Rheumatoid arthritis involving both hands, unspecified whether rheumatoid factor present (HCC)    AVN (avascular necrosis of bone) (HCC)     Orders Placed This Encounter  Procedures   MR Knee Left  Wo Contrast    Orders Placed This Encounter  Procedures   MR Knee Left  Wo Contrast

## 2023-04-17 NOTE — Assessment & Plan Note (Signed)
 Acute left knee pain with x-ray showing mild osteoarthritis in the medial and patellofemoral compartments.  Also does have some fragmentation of the tibial tubercle consistent with prior Osgood slaughters when he was a teenager -On exam he has joint line tenderness, positive McMurray and he has a history of AVN.  DDx to include: Exacerbation of underlying osteoarthritis, meniscus tear, AVN, other abnormality  Plan: -Personally reviewed notes from PCP visit as noted in the HPI -Reviewed x-rays as noted above -MRI left knee STAT to rule out AVN versus meniscus tear versus other abnormality -Should discontinue Naprosyn due to GI discomfort.  Recommended Voltaren gel every 6 hours as needed -Should start using knee sleeve -Follow-up pending MRI results to discuss further treatment which could include PT, cortisone injection, referral to surgeon

## 2023-04-17 NOTE — Assessment & Plan Note (Addendum)
-   on Methotrexate -no prior involvement of the left knee -No prior long-term oral steroids  Plan: -Continue methotrexate as prescribed

## 2023-04-17 NOTE — Patient Instructions (Addendum)
 [facility] [address], [place], [place] [address] Phone: [phone]  [facility] [address], [place], [place] [address] Phone: [phone]

## 2023-04-20 ENCOUNTER — Ambulatory Visit
Admission: RE | Admit: 2023-04-20 | Discharge: 2023-04-20 | Disposition: A | Discharge status: Home / Self Care | Payer: MEDICAID | Source: Ambulatory Visit | Attending: Family Medicine | Admitting: Family Medicine

## 2023-04-20 DIAGNOSIS — M1712 Unilateral primary osteoarthritis, left knee: Secondary | ICD-10-CM

## 2023-04-20 DIAGNOSIS — M8700 Idiopathic aseptic necrosis of unspecified bone: Secondary | ICD-10-CM

## 2023-04-20 NOTE — Progress Notes (Unsigned)
    SUBJECTIVE:   CHIEF COMPLAINT / HPI:   Chest pain, palpitations Not happening anymore.  Feels like symptoms have improved since his niece moved out.  Stopped drinking alcohol, last drink on 3/8.  Down to half pack per day.  Knee pain f/u Able to walk. Improving a little bit. Cannot wear steel toe boot due to knee pain. Using Voltaren gel that helps some. Not interested in surgeon right now.  Has follow-up with sports medicine as needed.  PERTINENT  PMH / PSH: A-fib, HTN, OSA, alcohol use disorder, tobacco use  OBJECTIVE:   BP (!) 147/95   Pulse 65   Ht 5\' 8"  (1.727 m)   Wt 261 lb (118.4 kg)   SpO2 98%   BMI 39.68 kg/m    General: NAD, pleasant, able to participate in exam Cardiac: RRR, no murmurs. Respiratory: CTAB, normal effort, No wheezes, rales or rhonchi Abdomen: Bowel sounds present, nontender, nondistended Extremities: no edema or cyanosis. Skin: warm and dry, no rashes noted Neuro: alert, no obvious focal deficits Psych: Normal affect and mood  ASSESSMENT/PLAN:   Assessment & Plan Alcohol use disorder Last drink 3/8, denies withdrawal symptoms.  ED precautions for withdrawal discussed. -Start naltrexone 50 mg daily Coronary artery disease involving native heart without angina pectoris, unspecified vessel or lesion type PREVENT score ~11% (based on prior lipid panel) and notable CAD on prior CT chest imaging.  Recommend initiating statin therapy. -Start rosuvastatin 20 mg daily -Lipid panel Longstanding persistent atrial fibrillation (HCC) S/p ablation in 2008, has not been on anticoagulation secondary to alcohol use disorder.  Palpitations at office visit on 03/05/2023, concern for A-fib recurrence therefore will obtain long-term monitor to determine if additional rate/rhythm control is needed and further inform anticoagulation. -Zio patch Primary hypertension 147/95 upon repeat, has been persistently elevated at multiple office visits.  Will initiate  antihypertensive therapy today. -Start olmesartan-HCTZ 20-12.5 mg daily Polycythemia Multiple elevated hemoglobin above 17 in the past 2 years. Possibly secondary to OSA but will obtain additional blood work to rule out secondary cause. -CBC and EPO -Consider hematology referral if persistent Tobacco use Down to 1/2 pack/day, wants to see if Naltrexone will help with cravings to quit. -Low dose CT lung cancer screening Chronic pain of right knee Secondary to medial meniscal injury seen on MRI.  Wants to continue supportive care and hold on orthopedic referral.  Follow-up with sports medicine as needed. -Provided letter for work accommodation   Dr. Elberta Fortis, DO [facility]

## 2023-04-21 ENCOUNTER — Ambulatory Visit (INDEPENDENT_AMBULATORY_CARE_PROVIDER_SITE_OTHER): Payer: MEDICAID | Admitting: Family Medicine

## 2023-04-21 ENCOUNTER — Encounter: Payer: Self-pay | Admitting: Family Medicine

## 2023-04-21 ENCOUNTER — Ambulatory Visit: Payer: MEDICAID | Attending: Family Medicine

## 2023-04-21 VITALS — BP 147/95 | HR 65 | Ht 68.0 in | Wt 261.0 lb

## 2023-04-21 DIAGNOSIS — F1090 Alcohol use, unspecified, uncomplicated: Secondary | ICD-10-CM

## 2023-04-21 DIAGNOSIS — Z720 Tobacco use: Secondary | ICD-10-CM

## 2023-04-21 DIAGNOSIS — I4811 Longstanding persistent atrial fibrillation: Secondary | ICD-10-CM | POA: Diagnosis not present

## 2023-04-21 DIAGNOSIS — S83239A Complex tear of medial meniscus, current injury, unspecified knee, initial encounter: Secondary | ICD-10-CM | POA: Insufficient documentation

## 2023-04-21 DIAGNOSIS — M1712 Unilateral primary osteoarthritis, left knee: Secondary | ICD-10-CM | POA: Insufficient documentation

## 2023-04-21 DIAGNOSIS — I2510 Atherosclerotic heart disease of native coronary artery without angina pectoris: Secondary | ICD-10-CM

## 2023-04-21 DIAGNOSIS — M25561 Pain in right knee: Secondary | ICD-10-CM

## 2023-04-21 DIAGNOSIS — D751 Secondary polycythemia: Secondary | ICD-10-CM

## 2023-04-21 DIAGNOSIS — I10 Essential (primary) hypertension: Secondary | ICD-10-CM

## 2023-04-21 DIAGNOSIS — F1014 Alcohol abuse with alcohol-induced mood disorder: Secondary | ICD-10-CM

## 2023-04-21 DIAGNOSIS — G8929 Other chronic pain: Secondary | ICD-10-CM

## 2023-04-21 HISTORY — DX: Complex tear of medial meniscus, current injury, unspecified knee, initial encounter: S83.239A

## 2023-04-21 MED ORDER — OLMESARTAN MEDOXOMIL-HCTZ 20-12.5 MG PO TABS
1.0000 | ORAL_TABLET | Freq: Every day | ORAL | 1 refills | Status: DC
Start: 2023-04-21 — End: 2023-09-05

## 2023-04-21 MED ORDER — ROSUVASTATIN CALCIUM 20 MG PO TABS: 20.0000 mg | ORAL_TABLET | Freq: Every day | ORAL | 3 refills | Status: AC

## 2023-04-21 MED ORDER — NALTREXONE HCL 50 MG PO TABS: 50.0000 mg | ORAL_TABLET | Freq: Every day | ORAL | 1 refills | Status: DC

## 2023-04-21 NOTE — Progress Notes (Unsigned)
 EP to read.

## 2023-04-21 NOTE — Patient Instructions (Addendum)
 It was wonderful to see you today! Thank you for choosing [facility].   Please bring ALL of your medications with you to every visit.   Today we talked about:  For your atrial fibrillation I am sending a long-term monitor called a Zio patch to your home.  I will monitor your heart rate for 2 weeks and please send it back so I can get the results.  We can continue discussion about anticoagulation for possible referral to cardiology if you are having recurrent episodes. Your blood pressure is persistently high today I think we need to start medication.  Please take the combo pill Benicar 1 time daily.  I recommend checking her blood pressure at home if possible with a goal of less than 130/80. We are checking your cholesterol today but I would like you to restart a statin given your coronary artery disease.  Please take the rosuvastatin 1 time daily. Please start taking the naltrexone today and continue to abstain from alcohol possible.  If you have any symptoms of withdrawal that are concerning please return to care to be seen. For your elevated red blood cell count I am getting additional blood work today.  Based on the results he may need referral to the hematologist for further evaluation as this puts you at high risk for clot. I ordered a low-dose CT screening for lung cancer today are possible run through insurance and call you with the scheduling information. I will provide you with a work note for accommodations for your ongoing knee pain.  If you would like to discuss with the surgeon given your meniscal injury please let the sports medicine doctor know so he can refer you.  Please follow up in 3-4 weeks  If you haven't already, sign up for My Chart to have easy access to your labs results, and communication with your primary care physician.   We are checking some labs today. If they are abnormal, I will call you. If they are normal, I will send you a MyChart message (if it  is active) or a letter in the mail. If you do not hear about your labs in the next 2 weeks, please call the office.  Call the clinic at [phone] if your symptoms worsen or you have any concerns.  Please be sure to schedule follow up at the front desk before you leave today.   Elberta Fortis, DO Family Medicine

## 2023-04-22 LAB — CBC WITH DIFFERENTIAL/PLATELET
Basophils Absolute: 0.1 10*3/uL (ref 0.0–0.2)
Basos: 1 %
EOS (ABSOLUTE): 0.2 10*3/uL (ref 0.0–0.4)
Eos: 2 %
Hematocrit: 47.3 % (ref 37.5–51.0)
Hemoglobin: 17.3 g/dL (ref 13.0–17.7)
Immature Grans (Abs): 0 10*3/uL (ref 0.0–0.1)
Immature Granulocytes: 1 %
Lymphocytes Absolute: 2.4 10*3/uL (ref 0.7–3.1)
Lymphs: 27 %
MCH: 35.3 pg — ABNORMAL HIGH (ref 26.6–33.0)
MCHC: 36.6 g/dL — ABNORMAL HIGH (ref 31.5–35.7)
MCV: 97 fL (ref 79–97)
Monocytes Absolute: 0.6 10*3/uL (ref 0.1–0.9)
Monocytes: 7 %
Neutrophils Absolute: 5.3 10*3/uL (ref 1.4–7.0)
Neutrophils: 62 %
Platelets: 211 10*3/uL (ref 150–450)
RBC: 4.9 x10E6/uL (ref 4.14–5.80)
RDW: 12.6 % (ref 11.6–15.4)
WBC: 8.6 10*3/uL (ref 3.4–10.8)

## 2023-04-22 LAB — COMPREHENSIVE METABOLIC PANEL
ALT: 61 IU/L — ABNORMAL HIGH (ref 0–44)
AST: 31 IU/L (ref 0–40)
Albumin: 4.3 g/dL (ref 3.8–4.9)
Alkaline Phosphatase: 66 IU/L (ref 44–121)
BUN/Creatinine Ratio: 10 (ref 9–20)
BUN: 8 mg/dL (ref 6–24)
Bilirubin Total: 0.6 mg/dL (ref 0.0–1.2)
CO2: 23 mmol/L (ref 20–29)
Calcium: 9.5 mg/dL (ref 8.7–10.2)
Chloride: 99 mmol/L (ref 96–106)
Creatinine, Ser: 0.77 mg/dL (ref 0.76–1.27)
Globulin, Total: 2.9 g/dL (ref 1.5–4.5)
Glucose: 112 mg/dL — ABNORMAL HIGH (ref 70–99)
Potassium: 4.4 mmol/L (ref 3.5–5.2)
Sodium: 138 mmol/L (ref 134–144)
Total Protein: 7.2 g/dL (ref 6.0–8.5)
eGFR: 106 mL/min/{1.73_m2} (ref >59)

## 2023-04-22 LAB — ERYTHROPOIETIN: Erythropoietin: 9 m[IU]/mL (ref 2.6–18.5)

## 2023-04-22 LAB — LIPID PANEL
Chol/HDL Ratio: 4 ratio (ref 0.0–5.0)
Cholesterol, Total: 192 mg/dL (ref 100–199)
HDL: 48 mg/dL (ref >39)
LDL Chol Calc (NIH): 96 mg/dL (ref 0–99)
Triglycerides: 284 mg/dL — ABNORMAL HIGH (ref 0–149)
VLDL Cholesterol Cal: 48 mg/dL — ABNORMAL HIGH (ref 5–40)

## 2023-04-22 NOTE — Assessment & Plan Note (Signed)
 S/p ablation in 2008, has not been on anticoagulation secondary to alcohol use disorder.  Palpitations at office visit on 03/05/2023, concern for A-fib recurrence therefore will obtain long-term monitor to determine if additional rate/rhythm control is needed and further inform anticoagulation. -Zio patch

## 2023-04-22 NOTE — Assessment & Plan Note (Signed)
 Last drink 3/8, denies withdrawal symptoms.  ED precautions for withdrawal discussed. -Start naltrexone 50 mg daily

## 2023-04-22 NOTE — Assessment & Plan Note (Signed)
 147/95 upon repeat, has been persistently elevated at multiple office visits.  Will initiate antihypertensive therapy today. -Start olmesartan-HCTZ 20-12.5 mg daily

## 2023-04-23 ENCOUNTER — Telehealth: Payer: Self-pay

## 2023-04-23 NOTE — Telephone Encounter (Signed)
 What is the diagnosis for this patients Qutenza patch?

## 2023-04-28 ENCOUNTER — Encounter: Payer: Self-pay | Admitting: Family Medicine

## 2023-04-28 ENCOUNTER — Ambulatory Visit (HOSPITAL_COMMUNITY)
Admission: RE | Admit: 2023-04-28 | Discharge: 2023-04-28 | Disposition: A | Discharge status: Home / Self Care | Payer: MEDICAID | Source: Ambulatory Visit | Attending: Family Medicine | Admitting: Family Medicine

## 2023-04-28 DIAGNOSIS — Z720 Tobacco use: Secondary | ICD-10-CM | POA: Insufficient documentation

## 2023-04-30 ENCOUNTER — Telehealth: Payer: Self-pay

## 2023-04-30 NOTE — Telephone Encounter (Signed)
 Approved on March 18 by [employer] 2017 Approved. QUTENZA (4 PATCH) 8% Kit is approved from 04/28/2023 to 04/28/2024. All strengths of the drug are approved. Effective Date: 04/28/2023 Authorization Expiration Date: 04/28/2024

## 2023-05-03 DIAGNOSIS — I4811 Longstanding persistent atrial fibrillation: Secondary | ICD-10-CM

## 2023-05-11 NOTE — Telephone Encounter (Signed)
 [name]

## 2023-05-14 ENCOUNTER — Encounter: Payer: Self-pay | Admitting: Family Medicine

## 2023-05-19 ENCOUNTER — Encounter: Payer: MEDICAID | Admitting: Physical Medicine and Rehabilitation

## 2023-05-19 ENCOUNTER — Ambulatory Visit: Payer: MEDICAID | Admitting: Student

## 2023-05-26 ENCOUNTER — Ambulatory Visit: Payer: MEDICAID | Admitting: Student

## 2023-05-26 ENCOUNTER — Ambulatory Visit (INDEPENDENT_AMBULATORY_CARE_PROVIDER_SITE_OTHER): Payer: MEDICAID | Admitting: Dermatology

## 2023-05-26 ENCOUNTER — Encounter: Payer: Self-pay | Admitting: Student

## 2023-05-26 ENCOUNTER — Encounter: Payer: Self-pay | Admitting: Dermatology

## 2023-05-26 VITALS — BP 119/77 | HR 80 | Ht 68.0 in | Wt 254.6 lb

## 2023-05-26 VITALS — BP 103/62 | HR 77

## 2023-05-26 DIAGNOSIS — L720 Epidermal cyst: Secondary | ICD-10-CM

## 2023-05-26 DIAGNOSIS — H8111 Benign paroxysmal vertigo, right ear: Secondary | ICD-10-CM

## 2023-05-26 NOTE — Patient Instructions (Addendum)
 Skin Education :   We counseled the patient regarding the following: Sun screen (SPF 30 or greater) should be applied during peak UV exposure (between 10am and 2pm) and reapplied after exercise or swimming.  The ABCDEs of melanoma were reviewed with the patient, and the importance of monthly self-examination of moles was emphasized. Should any moles change in shape or color, or itch, bleed or burn, pt will contact our office for evaluation sooner then their interval appointment.  Plan: Sunscreen Recommendations We recommended a broad spectrum sunscreen with a SPF of 30 or higher. SPF 30 sunscreens block approximately 97 percent of the sun's harmful rays. Sunscreens should be applied at least 15 minutes prior to expected sun exposure and then every 2 hours after that as long as sun exposure continues. If swimming or exercising sunscreen should be reapplied every 45 minutes to an hour after getting wet or sweating. One ounce, or the equivalent of a shot glass full of sunscreen, is adequate to protect the skin not covered by a bathing suit. We also recommended a lip balm with a sunscreen as well. Sun protective clothing can be used in lieu of sunscreen but must be worn the entire time you are exposed to the sun's rays.   Important Information   Due to recent changes in healthcare laws, you may see results of your pathology and/or laboratory studies on MyChart before the doctors have had a chance to review them. We understand that in some cases there may be results that are confusing or concerning to you. Please understand that not all results are received at the same time and often the doctors may need to interpret multiple results in order to provide you with the best plan of care or course of treatment. Therefore, we ask that you please give [place] 2 business days to thoroughly review all your results before contacting the office for clarification. Should we see a critical lab result, you will be contacted  sooner.     If You Need Anything After Your Visit   If you have any questions or concerns for your doctor, please call our main line at [phone]. If no one answers, please leave a voicemail as directed and we will return your call as soon as possible. Messages left after 4 pm will be answered the following business day.    You may also send [place] a message via MyChart. We typically respond to MyChart messages within 1-2 business days.  For prescription refills, please ask your pharmacy to contact our office. Our fax number is [phone].  If you have an urgent issue when the clinic is closed that cannot wait until the next business day, you can page your doctor at the number below.     Please note that while we do our best to be available for urgent issues outside of office hours, we are not available 24/7.    If you have an urgent issue and are unable to reach [place], you may choose to seek medical care at your doctor's office, retail clinic, urgent care center, or emergency room.   If you have a medical emergency, please immediately call 911 or go to the emergency department. In the event of inclement weather, please call our main line at [phone] for an update on the status of any delays or closures.  Dermatology Medication Tips: Please keep the boxes that topical medications come in in order to help keep track of the instructions about where and how to use these.  Pharmacies typically print the medication instructions only on the boxes and not directly on the medication tubes.   If your medication is too expensive, please contact our office at [phone] or send [place] a message through MyChart.    We are unable to tell what your co-pay for medications will be in advance as this is different depending on your insurance coverage. However, we may be able to find a substitute medication at lower cost or fill out paperwork to get insurance to cover a needed medication.    If a prior  authorization is required to get your medication covered by your insurance company, please allow [place] 1-2 business days to complete this process.   Drug prices often vary depending on where the prescription is filled and some pharmacies may offer cheaper prices.   The website www.goodrx.com contains coupons for medications through different pharmacies. The prices here do not account for what the cost may be with help from insurance (it may be cheaper with your insurance), but the website can give you the price if you did not use any insurance.  - You can print the associated coupon and take it with your prescription to the pharmacy.  - You may also stop by our office during regular business hours and pick up a GoodRx coupon card.  - If you need your prescription sent electronically to a different pharmacy, notify our office through [facility] or by phone at [phone]

## 2023-05-26 NOTE — Progress Notes (Signed)
   New Patient Visit   Subjective  Blake Arnold is a 53 y.o. male who presents for the following: growths Pt has 2 growths on his back for several years that won't resolve. He has no hx of skin cancer nor family hx. Had been drained previously. Non painful  The following portions of the chart were reviewed this encounter and updated as appropriate: medications, allergies, medical history  Review of Systems:  No other skin or systemic complaints except as noted in HPI or Assessment and Plan.  Objective  Well appearing patient in no apparent distress; mood and affect are within normal limits.  A focused examination was performed of the following areas: back  Relevant exam findings are noted in the Assessment and Plan.    Assessment & Plan   EPIDERMAL INCLUSION CYST Exam: Subcutaneous nodule at mid and upper back  Benign-appearing. Exam most consistent with an epidermal inclusion cyst. Discussed that a cyst is a benign growth that can grow over time and sometimes get irritated or inflamed. Recommend observation if it is not bothersome. Discussed option of surgical excision to remove it if it is growing, symptomatic, or other changes noted. Please call for new or changing lesions so they can be evaluated. Patient can scheduled for WLE for each spot, 14 days apart   Return for SURGERY.  I, Wilson Hasten, CMA, am acting as scribe for Deneise Finlay, MD.   Documentation: I have reviewed the above documentation for accuracy and completeness, and I agree with the above.  Deneise Finlay, MD

## 2023-05-26 NOTE — Patient Instructions (Addendum)
 It was great to see you! Thank you for allowing me to participate in your care!   I recommend that you always bring your medications to each appointment as this makes it easy to ensure we are on the correct medications and helps us  not miss when refills are needed.  Our plans for today:  - I have sent a referral for vestibular therapy, you will receive a call in 1-2 weeks. - In the meantime, be cautious with fast head movements. Sit down when you feel dizzy.    Take care and seek immediate care sooner if you develop any concerns. Please remember to show up 15 minutes before your scheduled appointment time!  Lavada Porteous, DO [facility]

## 2023-05-26 NOTE — Assessment & Plan Note (Signed)
 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  Small abrasion in right ear likely 2/2 trauma from Q-tip-counseled on not using Q-tips. Low concern for arrhythmia (normal Zio patch), dehydration (hydrated on exam), meniere's (normal hearing, no tinnitus), orthostatics (negative today), anemia (hgb 17). - Referral to vestibular rehab - Education provided - Follow-up if symptoms fail to improve with rehab

## 2023-05-26 NOTE — Progress Notes (Deleted)
  SUBJECTIVE:   CHIEF COMPLAINT / HPI:   Dizziness:  Dizziness 2 weeks. Feels off balance. No falls. No NVD.   Hx of A.fib s/p ablation. Zio patch monitoring in March 2025, rare SVT.  Recently discontinued drinking, last drink was on social chart.  On naltrexone for alcohol cessation.  Currently taking Benicar HCT 20-12.5 mg tablet, and metoprolol 25 mg tablet twice daily reports compliance.  Taking Crestor 20 mg p.o. daily.  Recent lab work including CBC unremarkable.  Mildly elevated AST likely in setting of alcohol use.   PERTINENT  PMH / PSH: A-fib s/p ablation, HTN, OSA, alcohol use disorder, tobacco use, rheumatoid arthritis on methotrexate, HLD  OBJECTIVE:  BP 119/77   Pulse 80   Ht 5\' 8"  (1.727 m)   Wt 254 lb 9.6 oz (115.5 kg)   SpO2 97%   BMI 38.71 kg/m  General: NAD, pleasant, *** HEENT: Normocephalic, atraumatic head. Normal LEFT external ear, canal, TM bilaterally.  Blood in RIGHT canal, TM is   EOM intact and normal conjunctiva BL. Normal external nose. Throat not erythematous, no exudate, no deviation. Normal dentition.  Cardio: RRR, no MRG. Cap Refill <2s. Respiratory: CTAB, normal wob on RA GI: Abdomen is soft, not tender, not distended. BS present MSK: *** Skin: Warm and dry Dixhallpike POSITIVE   Orthostatic VS for the past 72 hrs (Last 3 readings):  Orthostatic BP Orthostatic Pulse  05/26/23 1122 101/70 84  05/26/23 1121 114/70 78  05/26/23 1120 115/75 80   ASSESSMENT/PLAN:   Assessment & Plan Dizziness Strongly suspect BPPV given positive Dix-Hallpike and history. Low concern for arrhythmia (normal Zio patch), dehydration (hydrated on exam), orthostatic (negative today), anemia (hgb 17).  Benign paroxysmal positional vertigo of right ear    No follow-ups on file. Lavada Porteous, DO 05/26/2023, 11:24 AM PGY-2, [facility] {    This will disappear when note is signed, click to select method of visit    :1}

## 2023-05-26 NOTE — Progress Notes (Signed)
  SUBJECTIVE:   CHIEF COMPLAINT / HPI:   Dizziness  Dizziness 2 weeks. Feels off balance. No falls. No NVD, fevers.  Still drinking alcohol, but has reduced.  Still smoking but has reduced to half a pack a day.  No other drug use.  Takes blood pressure medications in the morning, however dizziness typically occurs prior to taking his medication.  Dizziness occurs when he wakes up or when he turns his head to the right very quickly.  Denies hearing loss or tinnitus.  Dizzy episodes last for seconds at a time.  Exacerbated with position.  Hx of A.fib s/p ablation. Zio patch monitoring in March 2025, rare SVT.  Recently discontinued drinking, last drink was on social chart.  On naltrexone for alcohol cessation.  Currently taking Benicar HCT 20-12.5 mg tablet, and metoprolol 25 mg tablet twice daily reports compliance.  Taking Crestor 20 mg p.o. daily.  Recent lab work including CBC unremarkable.  Mildly elevated AST likely in setting of alcohol use.   PERTINENT  PMH / PSH: A-fib s/p ablation, HTN, OSA, alcohol use disorder, tobacco use, rheumatoid arthritis on methotrexate, HLD  OBJECTIVE:  BP 119/77   Pulse 80   Ht 5\' 8"  (1.727 m)   Wt 254 lb 9.6 oz (115.5 kg)   SpO2 97%   BMI 38.71 kg/m   General: NAD, pleasant HEENT: Normocephalic, atraumatic head. Normal LEFT external ear, canal, TM bilaterally.  Blood in RIGHT canal, TM is normal Cardio: RRR, no MRG. Cap Refill <2s. Respiratory: CTAB, normal wob on RA Skin: Warm and dry Dixhallpike POSITIVE   Orthostatic VS for the past 72 hrs (Last 3 readings):  Orthostatic BP Orthostatic Pulse  05/26/23 1122 101/70 84  05/26/23 1121 114/70 78  05/26/23 1120 115/75 80   ASSESSMENT/PLAN:   Assessment & Plan Benign paroxysmal positional vertigo of right ear Strongly suspect BPPV given positive Dix-Hallpike and history.  Small abrasion in right ear likely 2/2 trauma from Q-tip-counseled on not using Q-tips. Low concern for arrhythmia  (normal Zio patch), dehydration (hydrated on exam), meniere's (normal hearing, no tinnitus), orthostatics (negative today), anemia (hgb 17). - Referral to vestibular rehab - Education provided - Follow-up if symptoms fail to improve with rehab  Lavada Porteous, DO 05/26/2023, 11:48 AM PGY-2, [facility]

## 2023-05-29 ENCOUNTER — Encounter: Payer: Self-pay | Admitting: Family Medicine

## 2023-05-29 ENCOUNTER — Ambulatory Visit (INDEPENDENT_AMBULATORY_CARE_PROVIDER_SITE_OTHER): Payer: MEDICAID | Admitting: Family Medicine

## 2023-05-29 VITALS — BP 148/88 | HR 71 | Ht 68.0 in | Wt 255.0 lb

## 2023-05-29 DIAGNOSIS — J302 Other seasonal allergic rhinitis: Secondary | ICD-10-CM

## 2023-05-29 DIAGNOSIS — H8111 Benign paroxysmal vertigo, right ear: Secondary | ICD-10-CM

## 2023-05-29 MED ORDER — CETIRIZINE HCL 10 MG PO TABS
10.0000 mg | ORAL_TABLET | Freq: Every day | ORAL | 11 refills | Status: DC
Start: 2023-05-29 — End: 2023-12-17

## 2023-05-29 NOTE — Progress Notes (Signed)
   SUBJECTIVE:   CHIEF COMPLAINT / HPI:  RENSO SWETT is a 53 y.o. male with a pertinent past medical history of HTN, RA, seizure history, prior alcohol use disorder, mood disorder (BPD?), Afib, OSA, recently diagnosed BPPV presenting to the clinic for worsening vertigo  BPPV  Vertigo Vertigo is worse since last clinic visit on Monday 4/14. Vertigo described as spinning, moving sensation. Does improve by evening. No ringing in ears or nausea at onset of dizziness. Hearing has not recently changed. Has not heard from vestibular rehab, has not attempted Epley maneuver and is unfamiliar with the technique. His father had this issue when he was younger. No weakness on either side of body, just pain in L hand consistent with known rheumatoid arthritis. No dysarthria or difficulty with speech.   PERTINENT PMH / PSH: HTN, RA, seizure history, prior alcohol use disorder, mood disorder (BPD?), Afib, OSA, BPPV  Patient works for [employer], started his job last week..  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  He is unable to currently perform his work duties.   OBJECTIVE:   BP (!) 148/88   Pulse 71   Ht 5\' 8"  (1.727 m)   Wt 255 lb (115.7 kg)   SpO2 95%   BMI 38.77 kg/m   General: Age-appropriate, resting comfortably in chair in mild distress, alert and at baseline. HEENT:  Head: Normocephalic, atraumatic. Eyes: PERRLA. No conjunctival erythema or scleral injections. Nose: Erythematous turbinates, crusted rhinorrhea. Mouth/Oral: Clear, no tonsillar exudate. MMM. Extremities: Capillary refill <2 seconds.  Neurological examination: *Positive Dix-Hallpike MS:  Awake, alert, interactive. Normal eye contact, answered the questions appropriately, speech was fluent, normal comprehension.  Attention and concentration were normal.  Alert to self, place, month, and situation. Cranial nerves: CN II:  PERRLA.  Visual fields intact to confrontation.  Blinks normally to threat bilaterally. CNs  III, IV, VI:  Full extraocular eye movement without nystagmus.  No ptosis or diplopia. CN V:  Facial sensation is normal, no weakness of masticatory muscles.  CN VII:  No facial weakness or asymmetry.  CN VIII:  Auditory acuity grossly normal.  Hearing intact to finger rub bilaterally. CNs XI/X:  Palate elevation symmetric. CN XI:  Normal sternocleidomastoid and trapezius strength. CN XII: Tongue is midline without atrophy or fasciculations. MOTOR:  Strength 5/5 RUE, 5/5 LUE, 5/5 RLE, 5/5 LLE. COORDINATION:  Intact finger-to-nose, no tremor.  Balance affected by vertigo. SENSATION:  Intact to light touch over all four extremities.  Unable to complete Romberg due to vertigo. DTRs: Normal patellar reflexes bilaterally. GAIT:  Normal walk, limited by dizziness.  ASSESSMENT/PLAN:   Assessment & Plan Benign paroxysmal positional vertigo of right ear Presentation consistent with BPPV visit earlier this week, worsening exacerbation of vertigo.  Unremarkable neurological exam with no concern for stroke at this time.  Patient has not yet been seen by vestibular rehab or attempted Epley maneuver at home. 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 - Start cetirizine 10 mg daily - Continue fluticasone nasal spray daily  No follow-ups on file.  Lavert Matousek Sharion Dove, MD [facility]

## 2023-05-29 NOTE — Assessment & Plan Note (Signed)
 Presentation consistent with BPPV visit earlier this week, worsening exacerbation of vertigo.  Unremarkable neurological exam with no concern for stroke at this time.  Patient has not yet been seen by vestibular rehab or attempted Epley maneuver at home. 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

## 2023-05-29 NOTE — Patient Instructions (Addendum)
 It was great to see you today! Thank you for choosing [facility] for your primary care.  Today we addressed: Benign Paroxysmal Positional Vertigo Attached are directions for the Epley maneuver.  I would attempt that a few times, giving yourself a few minutes break between attempts.  Try it anytime you feel the vertigo coming on.  I have talked to our referral coordinator and we are rushing the vestibular rehab as much as possible.  You should give [place] a call or send a message if your vertigo changes at all.  Thank you for coming to see [place] at [facility] and for the opportunity to care for you! Bonney Berres, MD 05/29/2023, 9:55 AM   How to Perform the Epley Maneuver The Epley maneuver is an exercise that relieves symptoms of vertigo. Vertigo is the feeling that you or your surroundings are moving when they are not. When you feel vertigo, you may feel like the room is spinning and may have trouble walking. The Epley maneuver is used for a type of vertigo caused by a calcium deposit in a part of the inner ear. The maneuver involves changing head positions to help the deposit move out of the area. You can do this maneuver at home whenever you have symptoms of vertigo. You can repeat it in 24 hours if your vertigo has not gone away. Even though the Epley maneuver may relieve your vertigo for a few weeks, it is possible that your symptoms will return. This maneuver relieves vertigo, but it does not relieve dizziness. What are the risks? If it is done correctly, the Epley maneuver is considered safe. Sometimes it can lead to dizziness or nausea that goes away after a short time. If you develop other symptoms--such as changes in vision, weakness, or numbness--stop doing the maneuver and call your health care provider. Supplies needed: A bed or table. A pillow. How to do the Epley maneuver     Sit on the edge of a bed or table with your back straight and your legs extended or  hanging over the edge of the bed or table. Turn your head halfway toward the affected ear or side as told by your health care provider. Lie backward quickly with your head turned until you are lying flat on your back. Your head should dangle (head-hanging position). You may want to position a pillow under your shoulders. Hold this position for at least 30 seconds. If you feel dizzy or have symptoms of vertigo, continue to hold the position until the symptoms stop. Turn your head to the opposite direction until your unaffected ear is facing down. Your head should continue to dangle. Hold this position for at least 30 seconds. If you feel dizzy or have symptoms of vertigo, continue to hold the position until the symptoms stop. Turn your whole body to the same side as your head so that you are positioned on your side. Your head will now be nearly facedown and no longer needs to dangle. Hold for at least 30 seconds. If you feel dizzy or have symptoms of vertigo, continue to hold the position until the symptoms stop. Sit back up. You can repeat the maneuver in 24 hours if your vertigo does not go away. Follow these instructions at home: For 24 hours after doing the Epley maneuver: Keep your head in an upright position. When lying down to sleep or rest, keep your head raised (elevated) with two or more pillows. Avoid excessive neck movements. Activity  Do not drive or use machinery if you feel dizzy. After doing the Epley maneuver, return to your normal activities as told by your health care provider. Ask your health care provider what activities are safe for you. General instructions Drink enough fluid to keep your urine pale yellow. Do not drink alcohol. Take over-the-counter and prescription medicines only as told by your health care provider. Keep all follow-up visits. This is important. Preventing vertigo symptoms Ask your health care provider if there is anything you should do at home to  prevent vertigo. He or she may recommend that you: Keep your head elevated with two or more pillows while you sleep. Do not sleep on the side of your affected ear. Get up slowly from bed. Avoid sudden movements during the day. Avoid extreme head positions or movement, such as looking up or bending over. Contact a health care provider if: Your vertigo gets worse. You have other symptoms, including: Nausea. Vomiting. Headache. Get help right away if you: Have vision changes. Have a headache or neck pain that is severe or getting worse. Cannot stop vomiting. Have new numbness or weakness in any part of your body. These symptoms may represent a serious problem that is an emergency. Do not wait to see if the symptoms will go away. Get medical help right away. Call your local emergency services (911 in the [place] Do not drive yourself to the hospital. Summary Vertigo is the feeling that you or your surroundings are moving when they are not. The Epley maneuver is an exercise that relieves symptoms of vertigo. If the Epley maneuver is done correctly, it is considered safe. This information is not intended to replace advice given to you by your health care provider. Make sure you discuss any questions you have with your health care provider. Document Revised: 10/25/2022 Document Reviewed: 10/25/2022 Elsevier Patient Education  2024 [employer].

## 2023-05-30 ENCOUNTER — Telehealth: Payer: Self-pay | Admitting: Family Medicine

## 2023-05-30 DIAGNOSIS — I25118 Atherosclerotic heart disease of native coronary artery with other forms of angina pectoris: Secondary | ICD-10-CM

## 2023-05-30 MED ORDER — ASPIRIN 81 MG PO TBEC: 81.0000 mg | DELAYED_RELEASE_TABLET | Freq: Every day | ORAL | 12 refills | Status: AC

## 2023-05-30 NOTE — Telephone Encounter (Signed)
-----   Message from Charmel Cooter sent at 05/23/2023  9:42 AM EDT -----  ----- Message ----- From: Dannis Dy, Rad Results In Sent: 05/23/2023   9:31 AM EDT To: Charmel Cooter, MD

## 2023-05-30 NOTE — Telephone Encounter (Signed)
 Called patient to discuss low-dose CT results. Results indicated age advanced two-vessel coronary artery calcification and emphysema.  The patient does report intermittent chest pain that has been going on for some time and shortness of breath.  Unfortunately has returned to smoking.  Had a week where he did not take his statin but is now taking it daily.  Given CT results and history recommend patient starting aspirin  81 mg daily and compliance on statin.  Given two-vessel CAD, recommended follow-up with cardiology for possible stress test.  Referral placed, office to follow-up with patient.  Given emphysema findings and intermittent shortness of breath, may need daily maintenance inhaler for COPD management.  Will discuss at follow-up visit.  Izetta Nap, DO

## 2023-06-02 NOTE — Therapy (Signed)
 OUTPATIENT PHYSICAL THERAPY VESTIBULAR EVALUATION     Patient Name: Blake Arnold MRN: [id number] DOB:1971/01/11, 53 y.o., male Today's Date: 06/03/2023  END OF SESSION:  PT End of Session - 06/03/23 0833     Visit Number 1    Number of Visits 9    Date for PT Re-Evaluation 07/01/23    Authorization Type Medicaid [employer]    Authorization Time Period auth submitted    PT Start Time 0759    PT Stop Time 0832    PT Time Calculation (min) 33 min    Activity Tolerance Patient tolerated treatment well    Behavior During Therapy [facility] for tasks assessed/performed             Past Medical History:  Diagnosis Date   A-fib (HCC)    Abdominal aortic aneurysm (AAA) (HCC)    34mm, infrarenal in 2024. 24yr follow up surviellence   Acid reflux    Alcohol  abuse    Allergy    seasonal   Anxiety    Asthma    AVN (avascular necrosis of bone) (HCC)    Blood transfusion without reported diagnosis    "during surgery"   Chronic back pain    Complex tear of medial meniscus of knee 04/21/2023   COPD (chronic obstructive pulmonary disease) (HCC)    Depression    Hepatitis C    history of   History of urinary frequency    Hyperlipidemia    Hypertension    Peripheral neuropathy    hands and feet   Polysubstance abuse (HCC) 02/09/2010   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HCC)    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HCC)    Seizures (HCC)    15 years ago   Sleep apnea    Past Surgical History:  Procedure Laterality Date   APPENDECTOMY     CARDIOVERSION  03/07/2006   CARPAL TUNNEL RELEASE     CERVICAL FUSION     LAPAROSCOPIC APPENDECTOMY N/A 07/23/2017   Procedure: APPENDECTOMY LAPAROSCOPIC;  Surgeon: Dorena Gander, MD;  Location: [facility] OR;  Service: General;  Laterality: N/A;   TOTAL HIP ARTHROPLASTY Right 2016   Patient Active Problem List   Diagnosis Date Noted   Benign paroxysmal positional vertigo of right ear 05/26/2023   Complex tear of medial meniscus of knee 04/21/2023   Osteoarthritis of left  knee 04/21/2023   Acute bacterial rhinosinusitis 11/25/2022   Abdominal aortic aneurysm (AAA) 30 to 34 mm in diameter (HCC) 09/12/2022   GERD (gastroesophageal reflux disease) 09/12/2022   AVN (avascular necrosis of bone) (HCC) 08/02/2021   History of hepatitis C 04/12/2021   Tooth abscess 01/14/2020   Tongue lesion 10/11/2019   Carpal tunnel syndrome 08/24/2019   Onychomycosis 08/24/2019   Cervicalgia 06/02/2019   Abdominal pain 02/28/2019   Hyperlipidemia 09/01/2018   HTN (hypertension) 07/24/2017   Seizure (HCC) 07/24/2017   RA (rheumatoid arthritis) (HCC) 07/24/2017   Degenerative joint disease 12/28/2015   OSA (obstructive sleep apnea) 11/23/2015   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) 07/26/2013   Bipolar disorder, unspecified (HCC) 10/28/2012   Alcohol  dependence (HCC) 04/02/2012   Major depression 04/02/2012   TOBACCO ABUSE 02/09/2010   Polysubstance abuse (HCC) 02/09/2010   FIBRILLATION, ATRIAL 06/08/2009    PCP: Jonne Netters, MD  REFERRING PROVIDER: Kandis Ormond, DO  REFERRING DIAG: H81.11 (ICD-10-CM) - Benign paroxysmal positional vertigo of right ear  THERAPY DIAG:  BPPV (benign paroxysmal positional vertigo), right  Dizziness and  giddiness  ONSET DATE: 3-3.5 weeks ago  Rationale for Evaluation and Treatment: Rehabilitation  SUBJECTIVE:   SUBJECTIVE STATEMENT: Patient reports dizziness for 3-3.5 weeks. First noticed it when walking to his kitchen and had to grab a chair. Aggravated by turning head to the R when lying on his back, staring at the phone for too long. Denies head trauma, infection/illness, double vision, hearing loss, tinnitus, migraines, balance changes. Reports that he has been dealing with bad allergies lately. Reports occasional blurred vision.    Pt accompanied by: self  PERTINENT HISTORY: A-fib, AAA, polysubstance abuse, anxiety, asthma, chronic back  pain, COPD, depression, HLD, HTN, peripheral neuropathy, rheumatoid arthritis, seizures, cervical fusion, R THA 2016  PAIN:  Are you having pain?  "A little bit in my neck. It's not something I'm not used to"  PRECAUTIONS: None  RED FLAGS: None   WEIGHT BEARING RESTRICTIONS: No  FALLS: Has patient fallen in last 6 months? 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 Recently had to let the job do d/t dizziness. Pt likes to hike and camp    PATIENT GOALS: improve dizziness  OBJECTIVE:  Note: Objective measures were completed at Evaluation unless otherwise noted.  DIAGNOSTIC FINDINGS: none recent  COGNITION: Overall cognitive status: Within functional limits for tasks assessed   SENSATION: Reports constant numbness in L 2 medial fingers and occasional N/T in B feet  POSTURE:  rounded shoulders and forward head  GAIT: Gait pattern: R lateral lean Assistive device utilized: None Level of assistance: Modified independence   PATIENT SURVEYS:  DHI 52/100  VESTIBULAR ASSESSMENT:  GENERAL OBSERVATION: pt wears bifocals   OCULOMOTOR EXAM:  Ocular Alignment: normal  Ocular ROM: No Limitations  Spontaneous Nystagmus:  absent  Gaze-Induced Nystagmus: absent  Smooth Pursuits: intact and c/o "felt like I went a little cross eyes 1 time"  Saccades: intact  Convergence/Divergence: 9 cm   VESTIBULAR - OCULAR REFLEX:   Slow VOR: Normal and Comment: c/o mild latent dizziness after completing horizontal  VOR Cancellation: Corrective Saccades subtle to L side  Head-Impulse Test: HIT Right: negative HIT Left: negative   Alternating pronation/supination: WNL Alternating toe tap: WNL Finger to nose: WNL    POSITIONAL TESTING:   *c/o some dizziness upon lying supine Right Roll Test: negative; c/o mild dizziness, improved the longer position is held Left Roll Test: negative C/o mild dizziness upon sitting up    Loaded Left Dix-Hallpike: negative  Loaded Right Dix-Hallpike: R upbeating torsional nystagmus lasting 20 sec                                                                                                                               TREATMENT DATE: 06/03/23  Canalith Repositioning:  Epley Right: Number of Reps: 1, Response to Treatment: comment: did not retest, and Comment: tolerated well   PATIENT EDUCATION: Education details: prognosis, POC, post-CRM expectations, edu on BPPV Person educated: Patient Education method: Explanation Education comprehension: verbalized understanding  HOME EXERCISE PROGRAM:  GOALS: Goals reviewed with patient? Yes  SHORT TERM GOALS: Target date: 06/17/2023  Patient to be independent with initial HEP. Baseline: HEP initiated Goal status: INITIAL    LONG TERM GOALS: Target date: 07/01/2023  Patient to be independent with advanced HEP. Baseline: Not yet initiated  Goal status: INITIAL  Patient to report 0/10 dizziness with standing vertical and horizontal VOR for 30 seconds. Baseline: Unable Goal status: INITIAL  Patient will report 0/10 dizziness with bed mobility.  Baseline: Symptomatic  Goal status: INITIAL  Patient to score at least 18 points less on DHI in order to meet MCID and improve functional outcomes.  Baseline: 52 Goal status: INITIAL   ASSESSMENT:  CLINICAL IMPRESSION:   Patient is a 53 y/o M presenting to OPPT with c/o dizziness for the past 3 weeks. Denies head trauma, infection/illness, double vision, hearing loss, tinnitus, migraines, balance changes. Patient today presented with Gait deviation, convergence insufficiency, subtle corrective saccades with L VOR cancellation, and positive R DH. Patient treated with R Epley x1  which he tolerated well. Would benefit from skilled PT services 1-2x/week for 4 weeks to address aforementioned impairments in order to optimize level of function.    OBJECTIVE IMPAIRMENTS: Abnormal gait and dizziness.   ACTIVITY LIMITATIONS: carrying, lifting, bending, standing, transfers, bed mobility, bathing, toileting, dressing, and hygiene/grooming  PARTICIPATION LIMITATIONS: meal prep, cleaning, laundry, shopping, community activity, occupation, and church  PERSONAL FACTORS: Age, Past/current experiences, Time since onset of injury/illness/exacerbation, and 3+ comorbidities: A-fib, AAA, polysubstance abuse, anxiety, asthma, chronic back pain, COPD, depression, HLD, HTN, peripheral neuropathy, rheumatoid arthritis, seizures, cervical fusion, R THA 2016  are also affecting patient's functional outcome.   REHAB POTENTIAL: Good  CLINICAL DECISION MAKING: Evolving/moderate complexity  EVALUATION COMPLEXITY: Moderate   PLAN:  PT FREQUENCY: 1-2x/week  PT DURATION: 4 weeks  PLANNED INTERVENTIONS: 97164- PT Re-evaluation, 97750- Physical Performance Testing, 97110-Therapeutic exercises, 97530- Therapeutic activity, 97112- Neuromuscular re-education, 97535- Self Care, 13086- Manual therapy, [phone]- Gait training, [phone]- Canalith repositioning, Patient/Family education, Balance training, Stair training, Taping, Dry Needling, Vestibular training, DME instructions, Cryotherapy, and Moist heat  PLAN FOR NEXT SESSION: retest R DH and treat; habituation PRN   Thaddeus Filippo, PT, DPT 06/03/23 8:39 AM  [facility] [address] [place], [place] [address] Phone # [phone] Fax # [phone]    For all possible CPT codes, reference the Planned Interventions line above.     Check all conditions that are expected to impact treatment: {Conditions expected to impact treatment:Psychological or psychiatric disorders   If  treatment provided at initial evaluation, no treatment charged due to lack of authorization.

## 2023-06-03 ENCOUNTER — Other Ambulatory Visit: Payer: Self-pay

## 2023-06-03 ENCOUNTER — Ambulatory Visit: Payer: MEDICAID | Attending: Family Medicine | Admitting: Physical Therapy

## 2023-06-03 ENCOUNTER — Encounter: Payer: Self-pay | Admitting: Physical Therapy

## 2023-06-03 DIAGNOSIS — R42 Dizziness and giddiness: Secondary | ICD-10-CM | POA: Insufficient documentation

## 2023-06-03 DIAGNOSIS — H8111 Benign paroxysmal vertigo, right ear: Secondary | ICD-10-CM | POA: Insufficient documentation

## 2023-06-06 ENCOUNTER — Ambulatory Visit: Payer: MEDICAID | Admitting: Physical Therapy

## 2023-06-10 ENCOUNTER — Ambulatory Visit: Payer: MEDICAID | Admitting: Physical Therapy

## 2023-06-13 ENCOUNTER — Encounter: Payer: Self-pay | Admitting: Family Medicine

## 2023-06-13 ENCOUNTER — Ambulatory Visit: Payer: MEDICAID | Admitting: Physical Therapy

## 2023-06-13 ENCOUNTER — Ambulatory Visit (INDEPENDENT_AMBULATORY_CARE_PROVIDER_SITE_OTHER): Payer: MEDICAID | Admitting: Family Medicine

## 2023-06-13 VITALS — BP 147/100 | HR 73 | Ht 68.0 in | Wt 254.8 lb

## 2023-06-13 DIAGNOSIS — H8111 Benign paroxysmal vertigo, right ear: Secondary | ICD-10-CM | POA: Diagnosis not present

## 2023-06-13 DIAGNOSIS — I2510 Atherosclerotic heart disease of native coronary artery without angina pectoris: Secondary | ICD-10-CM | POA: Diagnosis not present

## 2023-06-13 DIAGNOSIS — I10 Essential (primary) hypertension: Secondary | ICD-10-CM | POA: Diagnosis not present

## 2023-06-13 DIAGNOSIS — S09301A Unspecified injury of right middle and inner ear, initial encounter: Secondary | ICD-10-CM | POA: Diagnosis not present

## 2023-06-13 NOTE — Assessment & Plan Note (Signed)
 Provided Cardiology phone number from referral note, instructed patient to call and schedule appointment. -Follow up with PCP at next availability for this and emphysema -No concern for ACS or stable angina at this time

## 2023-06-13 NOTE — Assessment & Plan Note (Signed)
 Poorly controlled today.  Smoked cigarette immediately prior to visit and difficult to titrate medications in this setting.  Follow up with PCP at next availability. -Continue olmesartan -hydrochlorothiazide  20-12.5

## 2023-06-13 NOTE — Progress Notes (Signed)
 SUBJECTIVE:   CHIEF COMPLAINT / HPI:  Blake Arnold is a 53 y.o. male with a pertinent past medical history of BPPV presenting to the clinic for paperwork required for clearance to return to work, also reporting new right ear pain.  BPPV Patient was seen on 4/17 and prior to that on 4/14 for significant BPPV. Provided Epley maneuver and referral to vestibular rehab. Vertigo has resolved and patient is doing very well. He would like to return to work now and provides a form required by job.  Right ear pain Stuck a Q tip in ear 4-6 weeks ago, felt a pop and small amount of blood. Ear did not hurt while having vertigo. Ear started hurting 1-1.5 week ago. Patient did use some peroxide in ear 2 days ago. Hearing is unchanged bilaterally. No foreign body sensation in ear, no itching. Denies ear drainage, leaking of fluids. Has not used any other ear products.  Emphysema  CAD Per Dr. Viviano Ground phone note 4/18, patient has 2 vessel coronary artery calcification and emphysema. Referred to Cardiology, patient has not yet heard from them. Patient denies chest pain at rest and with exercise, dyspnea with exertion. No dyspnea in general.  No persistent cough or nighttime cough. Feels his breathing is normal. Taking ASA 81 mg per Dr. Ival Marines instructions.   PERTINENT PMH / PSH: BPPV, HTN, RA, seizure history, prior alcohol  use disorder, mood disorder (BPD?), Afib, OSA   Patient works for [employer], would like to return to work.  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  He is now completely able to perform his work duties.  Has started smoking again in past month. Discussed cessation and patient continues to try, declines medication at this time.   OBJECTIVE:   BP (!) 147/100   Pulse 73   Ht 5\' 8"  (1.727 m)   Wt 254 lb 12.8 oz (115.6 kg)   SpO2 97%   BMI 38.74 kg/m   General: Age-appropriate, resting comfortably in chair, NAD, alert and at baseline. HEENT:  Ears: R TM  nonbulging but with significant injection, single punctuate spot of blood.  No visible TM rupture or puncture, no drainage of fluid.  No erythema of external ear canal. No cerumen impaction.  L TM and ear unremarkable. Nose: Erythematous turbinates. No rhinorrhea. Cardiovascular: Regular rate and rhythm. Normal S1/S2. No murmurs, rubs, or gallops appreciated. 2+ radial pulses. Pulmonary: Clear bilaterally to ascultation. No wheezes, crackles, or rhonchi. Normal WOB on room air. No accessory muscle use. Extremities: No peripheral edema bilaterally. Capillary refill <2 seconds.   ASSESSMENT/PLAN:   Assessment & Plan Benign paroxysmal positional vertigo of right ear Greatly improved today with Epley and vestibular rehab, patient would like to return to work. -Filled out return to work form for 06/18/2023 -Reviewed Epley maneuver if needed in future Injury of tympanic membrane, right, initial encounter Based on exam and history, suspect patient scratched or even mildly punctured R TM with Qtip 4 weeks ago.  Use of peroxide 2 days ago likely exacerbated TM irritation.  Reassuringly, hearing is unchanged.  No visible rupture or drainage on exam today, see no indication for ENT referral. -Strict precautions to avoid Qtip use -Discontinue Peroxide, counseled on using Debrox in future -Complete avoidance of Debrox or other ear products for at least 1 month to facilitate healing -Return precautions if hearing changes, increased bleeding, drainage of fluids, or worsening pain Primary hypertension Poorly controlled today.  Smoked cigarette immediately prior to visit and difficult to  titrate medications in this setting.  Follow up with PCP at next availability. -Continue olmesartan -hydrochlorothiazide  20-12.5 Coronary artery calcification Provided Cardiology phone number from referral note, instructed patient to call and schedule appointment. -Follow up with PCP at next availability for this and  emphysema -No concern for ACS or stable angina at this time  Return in about 1 month (around 07/14/2023) for PCP follow up, only with Dr. Ival Marines.  Jase Reep Lansing Planas, MD [facility]

## 2023-06-13 NOTE — Assessment & Plan Note (Signed)
 Greatly improved today with Epley and vestibular rehab, patient would like to return to work. -Filled out return to work form for 06/18/2023 -Reviewed Epley maneuver if needed in future

## 2023-06-13 NOTE — Patient Instructions (Addendum)
 It was great to see you today! Thank you for choosing [facility] for your primary care.  Today we addressed: Ear pain I think you may have poked a small hole in your eardrum a few weeks ago, it is now healing.  I would avoid Qtips and also do not use pure peroxide in the ears, instead you can use Debrox drops for earwax.  I recommend not using these for a month from now.  Vertigo Glad it is doing better, remember the Epley maneuver if needed.  This may occur again in the future, so be prepared!  Here is the Cardiology phone number for your coronary artery disease discussion, please give them a call to schedule: [phone].  You should return to our clinic within 1 month with your PCP.  Thank you for coming to see us  at [facility] and for the opportunity to care for you! Lansing Planas, Karisha Marlin, MD 06/13/2023, 3:45 PM

## 2023-06-16 ENCOUNTER — Ambulatory Visit (INDEPENDENT_AMBULATORY_CARE_PROVIDER_SITE_OTHER): Payer: MEDICAID | Admitting: Student

## 2023-06-16 VITALS — BP 148/94 | HR 64 | Ht 68.0 in | Wt 258.6 lb

## 2023-06-16 DIAGNOSIS — S20211A Contusion of right front wall of thorax, initial encounter: Secondary | ICD-10-CM

## 2023-06-16 DIAGNOSIS — I10 Essential (primary) hypertension: Secondary | ICD-10-CM | POA: Diagnosis not present

## 2023-06-16 DIAGNOSIS — S298XXA Other specified injuries of thorax, initial encounter: Secondary | ICD-10-CM

## 2023-06-16 MED ORDER — LIDOCAINE 5 % EX PTCH: 1.0000 | MEDICATED_PATCH | CUTANEOUS | 0 refills | Status: AC

## 2023-06-16 MED ORDER — DICLOFENAC SODIUM 1 % EX GEL: 2.0000 g | Freq: Four times a day (QID) | CUTANEOUS | 3 refills | Status: AC

## 2023-06-16 NOTE — Progress Notes (Signed)
    SUBJECTIVE:   CHIEF COMPLAINT / HPI:   Discussed the use of AI scribe software for clinical note transcription with the patient, who gave verbal consent to proceed.  History of Present Illness Blake Arnold is a 53 year old male with rheumatoid arthritis who presents after a fall with right-sided rib pain.  He slipped on soap while getting out of the bathtub, landing on his right side, resulting in rib pain. No lightheadedness or dizziness prior to event. The pain is less severe than a previous rib fracture. It worsens when lying on his back and with certain movements, but he has no significant breathing difficulties, only discomfort when initially lying down. There is no bruising observed, and he has not taken any pain medication.  His positional vertigo has resolved, allowing him to return to work, but the fall has delayed this. He is eager to resume work.  He typically takes baths in the morning to help with joint stiffness from rheumatoid arthritis. He lives with his niece and her children, which he finds stressful.  He is a smoker with no recent changes in his cough. He is not on blood thinners and did not lose consciousness or hit his head during the fall.   PERTINENT  PMH / PSH: A-fib, AAA, alcohol  dependence, polysubstance use, seizures, RA on MTX OBJECTIVE:   BP (!) 148/94   Pulse 64   Ht 5\' 8"  (1.727 m)   Wt 258 lb 9.6 oz (117.3 kg)   SpO2 97%   BMI 39.32 kg/m   General: Well appearing, NAD, awake, alert, responsive to questions Head: Normocephalic atraumatic CV: Regular rate and rhythm no murmurs rubs or gallops Respiratory: Clear to ausculation bilaterally good breath sounds throughout, no wheezes rales or crackles, chest rises symmetrically,  no increased work of breathing, tenderness to palpation of right lower rib cage, no overlying bruising, no step offs palpated  ASSESSMENT/PLAN:   Assessment & Plan Rib contusion, right, initial encounter Acute  right-sided rib pain post-fall, likely contusion. Discussed likely contusion, declined radiography. - Provide topical analgesics, such as Voltaren  gel and lidocaine  patches - ED/return precautions discussed Primary hypertension Patient's blood pressure is not controlled today. BP: (!) 148/94. Goal of 130/80. Patient's medication regimen includes Benicar  to 20-12.5. -Changes to current regimen include none - possibly related to pain but likely needs increase in meds -Follow up in 2 weeks, discussed with pt   Genora Kidd, MD [facility]

## 2023-06-16 NOTE — Patient Instructions (Signed)
 It was great to see you! Thank you for allowing me to participate in your care!   Our plans for today:  - I am prescribing lidocaine  patches and voltaren  gel to place over your rib area - Please return to care if having issues breathing or you feel like the pain is getting worse or not improving  Take care and seek immediate care sooner if you develop any concerns.  Genora Kidd, MD

## 2023-06-16 NOTE — Assessment & Plan Note (Signed)
 Patient's blood pressure is not controlled today. BP: (!) 148/94. Goal of 130/80. Patient's medication regimen includes Benicar  to 20-12.5. -Changes to current regimen include none - possibly related to pain but likely needs increase in meds -Follow up in 2 weeks, discussed with pt

## 2023-06-19 ENCOUNTER — Telehealth: Payer: Self-pay

## 2023-06-19 ENCOUNTER — Encounter: Payer: Self-pay | Admitting: Student

## 2023-06-19 ENCOUNTER — Ambulatory Visit (INDEPENDENT_AMBULATORY_CARE_PROVIDER_SITE_OTHER): Payer: MEDICAID | Admitting: Student

## 2023-06-19 VITALS — BP 154/84 | HR 72 | Wt 256.0 lb

## 2023-06-19 DIAGNOSIS — R0781 Pleurodynia: Secondary | ICD-10-CM

## 2023-06-19 DIAGNOSIS — I10 Essential (primary) hypertension: Secondary | ICD-10-CM

## 2023-06-19 DIAGNOSIS — M069 Rheumatoid arthritis, unspecified: Secondary | ICD-10-CM | POA: Diagnosis not present

## 2023-06-19 NOTE — Progress Notes (Signed)
  SUBJECTIVE:   CHIEF COMPLAINT / HPI:   Worsening rib pain following a fall.  He slipped while getting out of the bathtub a week ago, hitting his right side. The pain has intensified, especially when lying down and attempting to sleep. Significant discomfort occurs with deep breaths, described as feeling like the lungs are pushing the ribcage out. He has been unable to sleep well for the past two nights and has missed two days of work. Pain is present at rest and worsens when lying down, with some relief when standing. No pain in other areas except for a brief episode of back pain the previous night, which has resolved.  He is using Voltaren  gel for pain relief, which has not been effective for this issue. He has not tried Tylenol  due to concerns about interactions with his rheumatoid arthritis medication. He is awaiting authorization for Lidocaine  patches.  He is a long-term smoker, currently smoking about five cigarettes a day, down from a pack a day, and has been smoking since age 30.  PERTINENT  PMH / PSH: A-fib, AAA, alcohol  dependence, polysubstance use, seizures, RA on methotrexate  OBJECTIVE:  BP (!) 154/84   Pulse 72   Wt 256 lb (116.1 kg)   SpO2 98%   BMI 38.92 kg/m  General: Well-appearing, NAD Pulm: CTAB, normal WOB MSK: Tenderness of approximately ribs 6 on the right without appreciable bruising or abnormal rib motion  ASSESSMENT/PLAN:   Assessment & Plan Rib pain on right side Suspect still rib contusion.   -Obtain chest x-ray to assess for rib fracture and lung involvement. - Tylenol  as needed, lidocaine  patches Rheumatoid arthritis involving both hands, unspecified whether rheumatoid factor present (HCC) High risk for compression fractures and osteoporosis due to RA and smoking history, obtain DEXA.  Primary hypertension Notably still elevated, in the setting of pain.  Plan unchanged from previous visit.  To be addressed at next visit when pain is better  controlled.   Return if symptoms worsen or fail to improve. Veronia Goon, DO 06/19/2023, 4:26 PM PGY-3, [facility]

## 2023-06-19 NOTE — Assessment & Plan Note (Signed)
 High risk for compression fractures and osteoporosis due to RA and smoking history, obtain DEXA.

## 2023-06-19 NOTE — Telephone Encounter (Signed)
 Pharmacy Patient Advocate Encounter   Received notification from CoverMyMeds that prior authorization for LIDOCAINE  5% PATCHES is required/requested.   Insurance verification completed.   The patient is insured through [facility] .   PA required; PA submitted to above mentioned insurance via CoverMyMeds Key/confirmation #/[id number]. Status is pending

## 2023-06-19 NOTE — Assessment & Plan Note (Signed)
 Notably still elevated, in the setting of pain.  Plan unchanged from previous visit.  To be addressed at next visit when pain is better controlled.

## 2023-06-19 NOTE — Patient Instructions (Addendum)
 It was great to see you today! Thank you for choosing [facility] for your primary care.  Today we addressed: You may take Tylenol .  The prior Auth for your lidocaine  patches should go through soon.  You may return to work tomorrow if you feel ready.  Lets get an x-ray although even if there is a sign of a fracture, there is likely no intervention necessary.  We are getting the x-ray to make sure there is no lung involvement.  I have also ordered a DEXA scan for you which is a bone scan since you are at increased risk for low bone density secondary to your rheumatoid arthritis.  If you haven't already, sign up for My Chart to have easy access to your labs results, and communication with your primary care physician.  Return if symptoms worsen or fail to improve. Please arrive 15 minutes before your appointment to ensure smooth check in process.  We appreciate your efforts in making this happen.  Thank you for allowing me to participate in your care, Blake Goon, DO 06/19/2023, 3:35 PM PGY-3, [facility]

## 2023-06-20 ENCOUNTER — Ambulatory Visit
Admission: RE | Admit: 2023-06-20 | Discharge: 2023-06-20 | Disposition: A | Discharge status: Home / Self Care | Payer: MEDICAID | Source: Ambulatory Visit | Attending: Family Medicine | Admitting: Family Medicine

## 2023-06-20 DIAGNOSIS — R0781 Pleurodynia: Secondary | ICD-10-CM

## 2023-06-20 NOTE — Telephone Encounter (Signed)
 Pharmacy Patient Advocate Encounter  Received notification from [employer] [place] [employer] that Prior Authorization for LIDOCAINE  5% PATCHES has been DENIED.  Full denial letter will be uploaded to the media tab. See denial reason below.     LIDOCAINE  4% PATCHES ARE AVAILABLE OTC  PA #/Case ID/Reference #: [id number]

## 2023-06-23 NOTE — Progress Notes (Unsigned)
    SUBJECTIVE:   CHIEF COMPLAINT / HPI:   Hypertension: - Medications:  Benicar  to 20-12.5  - Compliance: *** - Checking BP at home: *** - Denies any SOB, CP, vision changes, LE edema, medication SEs, or symptoms of hypotension - Diet: *** - Exercise: ***   Right rib pain Seen 5/5 for right rib pain secondary to fall.  Obtain rib and CXR, pending official read.  Alcohol  use?  Cardiology f/u  PERTINENT  PMH / PSH: ***  OBJECTIVE:   There were no vitals taken for this visit. ***  General: NAD, pleasant, able to participate in exam Cardiac: RRR, no murmurs. Respiratory: CTAB, normal effort, No wheezes, rales or rhonchi Abdomen: Bowel sounds present, nontender, nondistended Extremities: no edema or cyanosis. Skin: warm and dry, no rashes noted Neuro: alert, no obvious focal deficits Psych: Normal affect and mood  ASSESSMENT/PLAN:   No problem-specific Assessment & Plan notes found for this encounter.     Dr. Jonne Netters, DO [facility]    {    This will disappear when note is signed, click to select method of visit    :1}

## 2023-06-24 ENCOUNTER — Ambulatory Visit: Payer: MEDICAID | Admitting: Family Medicine

## 2023-06-24 ENCOUNTER — Encounter: Payer: Self-pay | Admitting: Dermatology

## 2023-06-24 ENCOUNTER — Encounter: Payer: Self-pay | Admitting: Family Medicine

## 2023-06-24 VITALS — BP 108/76 | HR 69 | Wt 259.0 lb

## 2023-06-24 DIAGNOSIS — I10 Essential (primary) hypertension: Secondary | ICD-10-CM | POA: Diagnosis not present

## 2023-06-24 DIAGNOSIS — R0600 Dyspnea, unspecified: Secondary | ICD-10-CM

## 2023-06-24 DIAGNOSIS — Z720 Tobacco use: Secondary | ICD-10-CM

## 2023-06-24 DIAGNOSIS — J439 Emphysema, unspecified: Secondary | ICD-10-CM

## 2023-06-24 DIAGNOSIS — R0781 Pleurodynia: Secondary | ICD-10-CM | POA: Diagnosis not present

## 2023-06-24 DIAGNOSIS — F1090 Alcohol use, unspecified, uncomplicated: Secondary | ICD-10-CM

## 2023-06-24 DIAGNOSIS — I2510 Atherosclerotic heart disease of native coronary artery without angina pectoris: Secondary | ICD-10-CM

## 2023-06-24 MED ORDER — ALBUTEROL SULFATE HFA 108 (90 BASE) MCG/ACT IN AERS: 1.0000 | INHALATION_SPRAY | Freq: Four times a day (QID) | RESPIRATORY_TRACT | 0 refills | Status: DC | PRN

## 2023-06-24 MED ORDER — ANORO ELLIPTA 62.5-25 MCG/ACT IN AEPB
1.0000 | INHALATION_SPRAY | Freq: Every day | RESPIRATORY_TRACT | 1 refills | Status: DC
Start: 2023-06-24 — End: 2023-09-23

## 2023-06-24 MED ORDER — TIZANIDINE HCL 4 MG PO TABS: 4.0000 mg | ORAL_TABLET | Freq: Four times a day (QID) | ORAL | 1 refills | Status: DC | PRN

## 2023-06-24 NOTE — Patient Instructions (Addendum)
 It was wonderful to see you today! Thank you for choosing [facility].   Please bring ALL of your medications with you to every visit.   Today we talked about:  For your COPD and emphysema we are starting the Anoro inhaler today.  It is possible it would be covered by insurance if so we will send in an alternative.  It will be 1 puff 1 time daily and this is a long-acting medication so please use your albuterol  as needed for symptom relief.  I also sent in a refill of albuterol  to your pharmacy. I am glad you have pretty much stopped drinking.  Please continue to abstain from alcohol  and cut back the amount of cigarettes you smoke if possible.  I recommend setting a date to quit and sticking to it. Your blood pressure looks good today, please continue take your medication as prescribed. For your rib pain as we discussed I think we need to maximize all the other therapies before considering any other opiates.  Given you are about to see pain management please follow-up with them to discuss and you can even call to see if you get a sooner appointment.  I do recommend using the 4% lidocaine  patches that you can find at the pharmacy.  In addition to that you can take Tylenol  1000 mg 4 times daily and I will send in an alternative muscle relaxer you can try since the methocarbamol  did not work as well for you. Please call Cardiology to schedule an appointment for likely having a stress test done given you have the coronary calcifications present on imaging.  [facility] [address] [place], [place] [phone]  Please follow up in 1 months  Call the clinic at [phone] if your symptoms worsen or you have any concerns.  Please be sure to schedule follow up at the front desk before you leave today.   Jonne Netters, DO Family Medicine

## 2023-06-25 NOTE — Assessment & Plan Note (Signed)
 108/76, well-controlled on current regimen.

## 2023-06-30 ENCOUNTER — Ambulatory Visit: Payer: MEDICAID | Admitting: Dermatology

## 2023-06-30 ENCOUNTER — Ambulatory Visit: Payer: Self-pay | Admitting: Student

## 2023-06-30 ENCOUNTER — Encounter: Payer: Self-pay | Admitting: Dermatology

## 2023-06-30 VITALS — BP 146/90 | HR 70 | Temp 98.3°F

## 2023-06-30 DIAGNOSIS — R220 Localized swelling, mass and lump, head: Secondary | ICD-10-CM | POA: Diagnosis not present

## 2023-06-30 DIAGNOSIS — D492 Neoplasm of unspecified behavior of bone, soft tissue, and skin: Secondary | ICD-10-CM

## 2023-06-30 DIAGNOSIS — D485 Neoplasm of uncertain behavior of skin: Secondary | ICD-10-CM

## 2023-06-30 DIAGNOSIS — L720 Epidermal cyst: Secondary | ICD-10-CM

## 2023-06-30 DIAGNOSIS — R229 Localized swelling, mass and lump, unspecified: Secondary | ICD-10-CM

## 2023-06-30 NOTE — Patient Instructions (Signed)

## 2023-06-30 NOTE — Progress Notes (Signed)
   Follow-Up Visit   Subjective  Blake Arnold is a 53 y.o. male who presents for the following: Excision of a Neoplasm of Skin on the mid back;   The following portions of the chart were reviewed this encounter and updated as appropriate: medications, allergies, medical history  Review of Systems:  No other skin or systemic complaints except as noted in HPI or Assessment and Plan.  Objective  Well appearing patient in no apparent distress; mood and affect are within normal limits.  A focused examination was performed of the following areas: Mid back Relevant physical exam findings are noted in the Assessment and Plan.  Mid Back Subcutaneous nodule     Assessment & Plan   NEOPLASM OF UNCERTAIN BEHAVIOR OF SKIN Mid Back Skin excision  Excision method:  elliptical Lesion length (cm):  2 Lesion width (cm):  1.7 Margin per side (cm):  0.1 Total excision diameter (cm):  2.2 Informed consent: discussed and consent obtained   Timeout: patient name, date of birth, surgical site, and procedure verified   Procedure prep:  Patient was prepped and draped in usual sterile fashion Prep type:  Chlorhexidine Anesthesia: the lesion was anesthetized in a standard fashion   Anesthetic:  1% lidocaine  w/ epinephrine  1-100,000 buffered w/ 8.4% NaHCO3 Instrument used: #15 blade   Hemostasis achieved with: suture, pressure and electrodesiccation   Outcome: patient tolerated procedure well with no complications   Post-procedure details: sterile dressing applied and wound care instructions given   Dressing type: bandage and pressure dressing    Skin repair Complexity:  Complex Final length (cm):  4 Informed consent: discussed and consent obtained   Timeout: patient name, date of birth, surgical site, and procedure verified   Procedure prep:  Patient was prepped and draped in usual sterile fashion Prep type:  Chlorhexidine Anesthesia: the lesion was anesthetized in a standard fashion    Anesthetic:  1% lidocaine  w/ epinephrine  1-100,000 buffered w/ 8.4% NaHCO3 Reason for type of repair: reduce tension to allow closure, preserve normal anatomy, preserve normal anatomical and functional relationships, avoid adjacent structures and allow side-to-side closure without requiring a flap or graft   Undermining: area extensively undermined   Subcutaneous layers (deep stitches):  Suture size:  3-0 Suture type: PDS (polydioxanone)   Stitches:  Buried vertical mattress Fine/surface layer approximation (top stitches):  Suture type: cyanoacrylate tissue glue   Hemostasis achieved with: suture, pressure and electrodesiccation Outcome: patient tolerated procedure well with no complications   Post-procedure details: sterile dressing applied and wound care instructions given   Dressing type: bandage and pressure dressing   Specimen 1 - Surgical pathology Differential Diagnosis: R/O cyst vs lipoma vs other  Check Margins: No SUBCUTANEOUS NODULE   Related Procedures US  Soft Tissue Head/Neck (NON-THYROID )  Subcutaneous Nodule on the Left Forehead Exam: Subcutaneous nodule at left forehead  Benign-appearing. Exam most consistent with an epidermal inclusion cyst vs lipoma. Will order on ultrasound.    Return in about 2 weeks (around 07/14/2023) for Excision left upper back.  Maurine Sovereign, RN, am acting as scribe for Deneise Finlay, MD .   Documentation: I have reviewed the above documentation for accuracy and completeness, and I agree with the above.  Deneise Finlay, MD

## 2023-07-02 LAB — SURGICAL PATHOLOGY

## 2023-07-03 ENCOUNTER — Ambulatory Visit: Payer: Self-pay | Admitting: Dermatology

## 2023-07-10 ENCOUNTER — Encounter: Payer: Self-pay | Admitting: Dermatology

## 2023-07-14 ENCOUNTER — Encounter: Payer: MEDICAID | Admitting: Physical Medicine and Rehabilitation

## 2023-07-15 ENCOUNTER — Ambulatory Visit: Payer: MEDICAID | Admitting: Dermatology

## 2023-07-28 ENCOUNTER — Ambulatory Visit (INDEPENDENT_AMBULATORY_CARE_PROVIDER_SITE_OTHER): Payer: MEDICAID | Admitting: Dermatology

## 2023-07-28 ENCOUNTER — Encounter: Payer: Self-pay | Admitting: Dermatology

## 2023-07-28 VITALS — BP 120/74 | HR 66 | Temp 97.8°F

## 2023-07-28 DIAGNOSIS — L720 Epidermal cyst: Secondary | ICD-10-CM | POA: Diagnosis not present

## 2023-07-28 DIAGNOSIS — D492 Neoplasm of unspecified behavior of bone, soft tissue, and skin: Secondary | ICD-10-CM | POA: Diagnosis not present

## 2023-07-28 DIAGNOSIS — L905 Scar conditions and fibrosis of skin: Secondary | ICD-10-CM

## 2023-07-28 DIAGNOSIS — D485 Neoplasm of uncertain behavior of skin: Secondary | ICD-10-CM

## 2023-07-28 NOTE — Progress Notes (Signed)
 Follow-Up Visit   Subjective  Blake Arnold is a 53 y.o. male who presents for the following: Excision of a neoplasm of skin of the upper back. He is s/p WLE for cyst on the mid back, which is healing well.   The following portions of the chart were reviewed this encounter and updated as appropriate: medications, allergies, medical history  Review of Systems:  No other skin or systemic complaints except as noted in HPI or Assessment and Plan.  Objective  Well appearing patient in no apparent distress; mood and affect are within normal limits.  A focused examination was performed of the following areas: Upper back Relevant physical exam findings are noted in the Assessment and Plan.   Upper Back Subcutaneous nodule   Assessment & Plan   NEOPLASM OF UNCERTAIN BEHAVIOR OF SKIN Upper Back Skin excision  Excision method:  elliptical Lesion length (cm):  1.2 Lesion width (cm):  0.8 Margin per side (cm):  0.1 Total excision diameter (cm):  1.4 Informed consent: discussed and consent obtained   Timeout: patient name, date of birth, surgical site, and procedure verified   Procedure prep:  Patient was prepped and draped in usual sterile fashion Prep type:  Chlorhexidine Anesthesia: the lesion was anesthetized in a standard fashion   Anesthetic:  1% lidocaine  w/ epinephrine  1-100,000 buffered w/ 8.4% NaHCO3 Instrument used: #15 blade   Hemostasis achieved with: suture, pressure and electrodesiccation   Outcome: patient tolerated procedure well with no complications   Post-procedure details: sterile dressing applied and wound care instructions given   Dressing type: bandage and pressure dressing    Skin repair Complexity:  Complex Final length (cm):  2.5 Informed consent: discussed and consent obtained   Timeout: patient name, date of birth, surgical site, and procedure verified   Procedure prep:  Patient was prepped and draped in usual sterile fashion Prep type:   Chlorhexidine Anesthesia: the lesion was anesthetized in a standard fashion   Anesthetic:  1% lidocaine  w/ epinephrine  1-100,000 buffered w/ 8.4% NaHCO3 Reason for type of repair: reduce tension to allow closure and preserve normal anatomical and functional relationships   Undermining: area extensively undermined   Subcutaneous layers (deep stitches):  Suture size:  3-0 Suture type: PDS (polydioxanone)   Stitches:  Buried vertical mattress Fine/surface layer approximation (top stitches):  Suture type: cyanoacrylate tissue glue   Hemostasis achieved with: suture, pressure and electrodesiccation Outcome: patient tolerated procedure well with no complications   Post-procedure details: sterile dressing applied and wound care instructions given   Dressing type: bandage and pressure dressing   Specimen 1 - Surgical pathology Differential Diagnosis: R/O cyst vs lipoma vs other  Check Margins:yes  The surgical wound was then cleaned, prepped, and re-anesthetized as above. Wound edges were undermined extensively along at least one entire edge and at a distance equal to or greater than the width of the defect (see wound defect size above) in order to achieve closure and decrease wound tension and anatomic distortion. Redundant tissue repair including standing cone removal was performed. Hemostasis was achieved with electrocautery. Subcutaneous and epidermal tissues were approximated with the above sutures. The surgical site was then lightly scrubbed with sterile, saline-soaked gauze. Steri-strips were applied, and the area was then bandaged using Vaseline ointment, non-adherent gauze, gauze pads, and tape to provide an adequate pressure dressing. The patient tolerated the procedure well, was given detailed written and verbal wound care instructions, and was discharged in good condition.   The patient will follow-up:  PRN.  Scar s/p WLE for mid back for cyst, treated on 06/30/2023, repaired with linear  closure - Reassured that wound has healed well - Discussed that scars take up to 12 months to mature from the date of surgery - Recommend SPF 30+ to scar daily to prevent purple color - OK to start scar massage at 4-6 weeks post-op - Can consider silicone based products for scar healing  EPIDERMAL INCLUSION CYST Exam: Subcutaneous nodule at back  Benign-appearing. Exam most consistent with an epidermal inclusion cyst.   Return for [facility] .  I, Haig Levan, Surg Tech III, am acting as scribe for Deneise Finlay, MD.   Documentation: I have reviewed the above documentation for accuracy and completeness, and I agree with the above.  Deneise Finlay, MD

## 2023-07-28 NOTE — Patient Instructions (Signed)

## 2023-07-30 LAB — SURGICAL PATHOLOGY

## 2023-07-31 ENCOUNTER — Ambulatory Visit: Payer: Self-pay | Admitting: Dermatology

## 2023-08-17 ENCOUNTER — Ambulatory Visit (HOSPITAL_COMMUNITY): Admission: EM | Admit: 2023-08-17 | Discharge: 2023-08-17 | Discharge status: Against Medical Advice | Payer: MEDICAID

## 2023-08-17 NOTE — ED Provider Notes (Addendum)
 This NP attempted to assess patient however NP was informed that patient left the facility. Per triage note, Pt denies Si, Hi and Avh.

## 2023-08-17 NOTE — Progress Notes (Signed)
   08/17/23 1632  [facility] Triage Screening (Walk-ins at [facility] only)  How Did You Hear About Us ? Self  What Is the Reason for Your Visit/Call Today? Pt presents to [facility] unaccompanied. Pt reports he is looking to detox off of cocaine and alcohol . Pt reports he drank two 40oz of beer. Pt states he drank to keep the shakes away. Pt reports he last used cocaine on Friday. 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 Pt states he is using alcohol  daily and cocaine on occasions. Pt is currently not in withdrawl. Pt is looking for short term treatment. Pt also has a list of medication he is taking at this time. Pt denies Si, Hi and Avh.  How Long Has This Been Causing You Problems? <Week  Have You Recently Had Any Thoughts About Hurting Yourself? No  Are You Planning to Commit Suicide/Harm Yourself At This time? No  Have you Recently Had Thoughts About Hurting Someone Sherral? No  Are You Planning To Harm Someone At This Time? No  Physical Abuse Denies  Verbal Abuse Denies  Sexual Abuse Denies  Exploitation of patient/patient's resources Denies  Self-Neglect Denies  Possible abuse reported to: Other (Comment)  Are you currently experiencing any auditory, visual or other hallucinations? No  Have You Used Any Alcohol  or Drugs in the Past 24 Hours? Yes  Do you have any current medical co-morbidities that require immediate attention? No  Clinician description of patient physical appearance/behavior: calm, cooperative  What Do You Feel Would Help You the Most Today? Alcohol  or Drug Use Treatment  If access to [facility] was not available, would you have sought care in the Emergency Department? No  Determination of Need Urgent (48 hours)  Options For Referral Outpatient Therapy;Facility-Based Crisis  Determination of Need filed? Yes

## 2023-08-18 ENCOUNTER — Emergency Department (HOSPITAL_COMMUNITY): Payer: MEDICAID

## 2023-08-18 ENCOUNTER — Encounter (HOSPITAL_COMMUNITY): Payer: Self-pay

## 2023-08-18 ENCOUNTER — Emergency Department (HOSPITAL_COMMUNITY)
Admission: EM | Admit: 2023-08-18 | Discharge: 2023-08-18 | Disposition: A | Discharge status: Home / Self Care | Payer: MEDICAID | Attending: Emergency Medicine | Admitting: Emergency Medicine

## 2023-08-18 ENCOUNTER — Other Ambulatory Visit: Payer: Self-pay

## 2023-08-18 DIAGNOSIS — F17210 Nicotine dependence, cigarettes, uncomplicated: Secondary | ICD-10-CM | POA: Insufficient documentation

## 2023-08-18 DIAGNOSIS — J449 Chronic obstructive pulmonary disease, unspecified: Secondary | ICD-10-CM | POA: Diagnosis not present

## 2023-08-18 DIAGNOSIS — Z9189 Other specified personal risk factors, not elsewhere classified: Secondary | ICD-10-CM | POA: Diagnosis not present

## 2023-08-18 DIAGNOSIS — I7143 Infrarenal abdominal aortic aneurysm, without rupture: Secondary | ICD-10-CM | POA: Diagnosis not present

## 2023-08-18 DIAGNOSIS — Z96641 Presence of right artificial hip joint: Secondary | ICD-10-CM | POA: Diagnosis not present

## 2023-08-18 DIAGNOSIS — R748 Abnormal levels of other serum enzymes: Secondary | ICD-10-CM | POA: Insufficient documentation

## 2023-08-18 DIAGNOSIS — Z7982 Long term (current) use of aspirin: Secondary | ICD-10-CM | POA: Insufficient documentation

## 2023-08-18 DIAGNOSIS — I10 Essential (primary) hypertension: Secondary | ICD-10-CM | POA: Insufficient documentation

## 2023-08-18 DIAGNOSIS — J45909 Unspecified asthma, uncomplicated: Secondary | ICD-10-CM | POA: Diagnosis not present

## 2023-08-18 DIAGNOSIS — R079 Chest pain, unspecified: Secondary | ICD-10-CM | POA: Diagnosis present

## 2023-08-18 LAB — URINALYSIS, ROUTINE W REFLEX MICROSCOPIC
Bilirubin Urine: NEGATIVE
Glucose, UA: NEGATIVE mg/dL
Hgb urine dipstick: NEGATIVE
Ketones, ur: NEGATIVE mg/dL
Leukocytes,Ua: NEGATIVE
Nitrite: NEGATIVE
Protein, ur: NEGATIVE mg/dL
Specific Gravity, Urine: 1.024 (ref 1.005–1.030)
pH: 5 (ref 5.0–8.0)

## 2023-08-18 LAB — LIPASE, BLOOD: Lipase: 29 U/L (ref 11–51)

## 2023-08-18 LAB — COMPREHENSIVE METABOLIC PANEL WITH GFR
ALT: 91 U/L — ABNORMAL HIGH (ref 0–44)
AST: 46 U/L — ABNORMAL HIGH (ref 15–41)
Albumin: 3.8 g/dL (ref 3.5–5.0)
Alkaline Phosphatase: 54 U/L (ref 38–126)
Anion gap: 11 (ref 5–15)
BUN: 12 mg/dL (ref 6–20)
CO2: 24 mmol/L (ref 22–32)
Calcium: 9.2 mg/dL (ref 8.9–10.3)
Chloride: 102 mmol/L (ref 98–111)
Creatinine, Ser: 0.88 mg/dL (ref 0.61–1.24)
GFR, Estimated: >60 mL/min (ref >60)
Glucose, Bld: 134 mg/dL — ABNORMAL HIGH (ref 70–99)
Potassium: 3.9 mmol/L (ref 3.5–5.1)
Sodium: 137 mmol/L (ref 135–145)
Total Bilirubin: 0.9 mg/dL (ref 0.0–1.2)
Total Protein: 7.5 g/dL (ref 6.5–8.1)

## 2023-08-18 LAB — I-STAT CG4 LACTIC ACID, ED
Lactic Acid, Venous: 2.3 mmol/L (ref 0.5–1.9)
Lactic Acid, Venous: 1.8 mmol/L (ref 0.5–1.9)

## 2023-08-18 LAB — TROPONIN I (HIGH SENSITIVITY)
Troponin I (High Sensitivity): 4 ng/L (ref <18)
Troponin I (High Sensitivity): 4 ng/L

## 2023-08-18 LAB — CBC
HCT: 50.5 % (ref 39.0–52.0)
Hemoglobin: 17.6 g/dL — ABNORMAL HIGH (ref 13.0–17.0)
MCH: 33.5 pg (ref 26.0–34.0)
MCHC: 34.9 g/dL (ref 30.0–36.0)
MCV: 96 fL (ref 80.0–100.0)
Platelets: 240 K/uL (ref 150–400)
RBC: 5.26 MIL/uL (ref 4.22–5.81)
RDW: 12.8 % (ref 11.5–15.5)
WBC: 8.6 K/uL (ref 4.0–10.5)
nRBC: 0 % (ref 0.0–0.2)

## 2023-08-18 LAB — ETHANOL: Alcohol, Ethyl (B): <15 mg/dL (ref <15)

## 2023-08-18 MED ORDER — ALUM & MAG HYDROXIDE-SIMETH 200-200-20 MG/5ML PO SUSP
15.0000 mL | Freq: Once | ORAL | Status: AC
  Administered 2023-08-18: 15 mL via ORAL
  Filled 2023-08-18: qty 30

## 2023-08-18 MED ORDER — SUCRALFATE 1 G PO TABS: 1.0000 g | ORAL_TABLET | Freq: Three times a day (TID) | ORAL | 0 refills | Status: DC

## 2023-08-18 MED ORDER — IPRATROPIUM-ALBUTEROL 0.5-2.5 (3) MG/3ML IN SOLN
3.0000 mL | Freq: Once | RESPIRATORY_TRACT | Status: AC
  Administered 2023-08-18: 3 mL via RESPIRATORY_TRACT
  Filled 2023-08-18: qty 3

## 2023-08-18 MED ORDER — PANTOPRAZOLE SODIUM 40 MG IV SOLR
40.0000 mg | Freq: Once | INTRAVENOUS | Status: AC
  Administered 2023-08-18: 40 mg via INTRAVENOUS
  Filled 2023-08-18: qty 10

## 2023-08-18 MED ORDER — SODIUM CHLORIDE 0.9 % IV BOLUS
1000.0000 mL | Freq: Once | INTRAVENOUS | Status: AC
  Administered 2023-08-18: 1000 mL via INTRAVENOUS

## 2023-08-18 MED ORDER — PANTOPRAZOLE SODIUM 20 MG PO TBEC: 20.0000 mg | DELAYED_RELEASE_TABLET | Freq: Every day | ORAL | 0 refills | Status: DC

## 2023-08-18 MED ORDER — IOHEXOL 350 MG/ML SOLN
75.0000 mL | Freq: Once | INTRAVENOUS | Status: AC | PRN
  Administered 2023-08-18: 75 mL via INTRAVENOUS

## 2023-08-18 NOTE — ED Notes (Signed)
 Pt ambulated out of ED with no other needs voiced at this time. IV taken out, gauze secured with tape placed over insertion site

## 2023-08-18 NOTE — ED Provider Triage Note (Signed)
 Emergency Medicine Provider Triage Evaluation Note  Blake Arnold , a 53 y.o. male  was evaluated in triage.  Pt complains of past medical history significant for tobacco abuse, alcohol  abuse, bipolar disorder, hypertension, hyperlipidemia presents with concern for chest pain, shortness of breath, some dizziness.  Reports chest pain is worse with deep breathing, worse when laying flat.  Reports that he normally drinks about a pint of liquor a day as well as smokes 1/2 pack of cigarettes per day.  He has not had a drink since yesterday.  Reports he only had 2 cigarettes today.  Denies any nausea or vomiting.  Review of Systems  Positive: Chest pain, shob Negative: Nausea, vomiting  Physical Exam  BP (!) 198/98   Pulse 83   Temp 98.4 F (36.9 C)   Resp 20   Ht 5' 8 (1.727 m)   Wt 117.9 kg   SpO2 98%   BMI 39.53 kg/m  Gen:   Awake, no distress   Resp:  Normal effort  MSK:   Moves extremities without difficulty  Other:  No significant wheezing throughout  Medical Decision Making  Medically screening exam initiated at 2:12 PM.  Appropriate orders placed.  Toribio MALVA Collet was informed that the remainder of the evaluation will be completed by another provider, this initial triage assessment does not replace that evaluation, and the importance of remaining in the ED until their evaluation is complete.  Workup initiated in triage    Rosan Sherlean DEL, [place] 08/18/23 1412

## 2023-08-18 NOTE — ED Notes (Signed)
 Lactic result given to christain p,pa by at

## 2023-08-18 NOTE — Discharge Instructions (Addendum)
 It was a pleasure caring for you today in the emergency department.  Please cut back on your alcohol  use, stopping alcohol  all at once can lead to withdrawal symptoms; recommend a gradual reduction.     Please continue to cut back on tobacco use.  Please abstain from cocaine use.  Follow up with you pcp for routine surveillance of your abdominal aortic aneurysm   Please follow bland diet for the next 2 weeks, follow-up with your PCP for recheck  Please return to the emergency department for any worsening or worrisome symptoms.

## 2023-08-18 NOTE — ED Provider Notes (Addendum)
 [facility] EMERGENCY DEPARTMENT AT [facility] Provider Note  CSN: [id number] Arrival date & time: 08/18/23 1314  Chief Complaint(s) Chest Pain  HPI Blake Arnold is a 53 y.o. male with past medical history as below, significant for atrial fibrillation, chronic alcohol  abuse, abdominal aortic aneurysm, COPD, polysubstance abuse who presents to the ED with complaint of chest pain abdominal pain  Patient reports has been having epigastric/chest pain over the past few days.  Feels the pain worsened last night, early this morning.  Worse when lying flat or after eating. Pain is a squeezing, aching sensation midsternum, radiates to his left upper quadrant.  Had some nausea but no vomiting.  No significant dyspnea.  History of COPD and continues to smoke.  Also reports daily alcohol  use, has increased his alcohol  since 7/4.  No BRBPR or melena, no hematemesis.  Reports he went to [facility] last night but left due to prolonged wait time  Past Medical History Past Medical History:  Diagnosis Date   A-fib [facility])    Abdominal aortic aneurysm (AAA) (HCC)    34mm, infrarenal in 2024. 87yr follow up surviellence   Acid reflux    Alcohol  abuse    Allergy    seasonal   Anxiety    Asthma    AVN (avascular necrosis of bone) (HCC)    Blood transfusion without reported diagnosis    during surgery   Chronic back pain    Complex tear of medial meniscus of knee 04/21/2023   COPD (chronic obstructive pulmonary disease) (HCC)    Depression    Hepatitis C    history of   History of urinary frequency    Hyperlipidemia    Hypertension    Peripheral neuropathy    hands and feet   Polysubstance abuse (HCC) 02/09/2010   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HCC)    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HCC)    Seizures (HCC)    15 years ago   Sleep apnea    Patient Active Problem List   Diagnosis Date Noted   Coronary artery calcification 06/13/2023   Benign paroxysmal positional vertigo of right ear 05/26/2023   Complex  tear of medial meniscus of knee 04/21/2023   Osteoarthritis of left knee 04/21/2023   Acute bacterial rhinosinusitis 11/25/2022   Abdominal aortic aneurysm (AAA) 30 to 34 mm in diameter (HCC) 09/12/2022   GERD (gastroesophageal reflux disease) 09/12/2022   AVN (avascular necrosis of bone) (HCC) 08/02/2021   History of hepatitis C 04/12/2021   Tooth abscess 01/14/2020   Tongue lesion 10/11/2019   Carpal tunnel syndrome 08/24/2019   Onychomycosis 08/24/2019   Cervicalgia 06/02/2019   Abdominal pain 02/28/2019   Hyperlipidemia 09/01/2018   HTN (hypertension) 07/24/2017   Seizure (HCC) 07/24/2017   RA (rheumatoid arthritis) (HCC) 07/24/2017   Degenerative joint disease 12/28/2015   OSA (obstructive sleep apnea) 11/23/2015   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 Authorizing Provider  pantoprazole  (PROTONIX ) 20 MG tablet Take 1 tablet (20 mg total) by mouth daily for 7 days. 08/18/23 08/25/23 Yes Elnor Savant A, DO  sucralfate  (CARAFATE ) 1 g tablet Take 1 tablet (1 g total) by mouth with  breakfast, with lunch, and with evening meal for 7 days. 08/18/23 08/25/23 Yes Elnor Jayson LABOR, DO  albuterol  (VENTOLIN  HFA) 108 (90 Base) MCG/ACT inhaler Inhale 1-2 puffs into the lungs every 6 (six) hours as needed for wheezing or shortness of breath. 06/24/23   Theophilus Pagan, MD  aspirin  EC 81 MG tablet Take 1 tablet (81 mg total) by mouth daily. Swallow whole. 05/30/23   Theophilus Pagan, MD  cetirizine  (ZYRTEC ) 10 MG tablet Take 1 tablet (10 mg total) by mouth daily. 05/29/23   Shitarev, Dimitry, MD  diclofenac   Sodium (VOLTAREN ) 1 % GEL Apply 2 g topically 4 (four) times daily. 06/16/23   Christia Budds, MD  famotidine  (PEPCID ) 20 MG tablet Take 1 tablet (20 mg total) by mouth daily. 09/12/22   Theophilus Pagan, MD  lidocaine  (LIDODERM ) 5 % Place 1 patch onto the skin daily. Remove & Discard patch within 12 hours or as directed by MD 06/16/23   Christia Budds, MD  Methotrexate, Anti-Rheumatic, (METHOTREXATE [place]) Inject into the skin. 07/12/22   [provider]  metoprolol  tartrate (LOPRESSOR ) 25 MG tablet Take 1 tablet (25 mg total) by mouth 2 (two) times daily. 05/05/22   White, Patrice L, NP  naltrexone  (DEPADE) 50 MG tablet Take 1 tablet (50 mg total) by mouth daily. Patient not taking: Reported on 06/13/2023 04/21/23   Theophilus Pagan, MD  olmesartan -hydrochlorothiazide  (BENICAR  HCT) 20-12.5 MG tablet Take 1 tablet by mouth daily. 04/21/23   Theophilus Pagan, MD  rosuvastatin  (CRESTOR ) 20 MG tablet Take 1 tablet (20 mg total) by mouth daily. 04/21/23   Theophilus Pagan, MD  tiZANidine  (ZANAFLEX ) 4 MG tablet Take 1 tablet (4 mg total) by mouth every 6 (six) hours as needed for muscle spasms. 06/24/23   Theophilus Pagan, MD  umeclidinium-vilanterol (ANORO ELLIPTA ) 62.5-25 MCG/ACT AEPB Inhale 1 puff into the lungs daily. 06/24/23   Theophilus Pagan, MD                                                                                                                                    Past Surgical History Past Surgical History:  Procedure Laterality Date   APPENDECTOMY     CARDIOVERSION  03/07/2006   CARPAL TUNNEL RELEASE     CERVICAL FUSION     LAPAROSCOPIC APPENDECTOMY N/A 07/23/2017   Procedure: APPENDECTOMY LAPAROSCOPIC;  Surgeon: Sebastian Moles, MD;  Location: [facility] OR;  Service: General;  Laterality: N/A;   TOTAL HIP ARTHROPLASTY Right 2016   Family History Family History  Problem Relation Age of Onset   Heart attack Mother    Atrial fibrillation Mother    Lung cancer Mother    Skin cancer Father     Colon polyps Neg Hx    Esophageal cancer Neg Hx    Stomach cancer Neg Hx    Rectal cancer Neg Hx    Colon cancer Neg Hx    Crohn's disease Neg Hx  Ulcerative colitis Neg Hx     Social History Social History   Tobacco Use   Smoking status: Every Day    Current packs/day: 0.50    Average packs/day: 1 pack/day for 39.5 years (39.4 ttl pk-yrs)    Types: Cigarettes    Start date: 02/12/1984    Passive exposure: Current   Smokeless tobacco: Former    Quit date: 10/24/2012   Tobacco comments:    1.5-2ppd  Quit attempt 12/24/2018    0.5 ppd, quit March 2025, resumed April 2025  Vaping Use   Vaping status: Never Used  Substance Use Topics   Alcohol  use: Not Currently    Comment: pint a day   Drug use: Not Currently    Types: Crack cocaine, Cocaine    Comment: 03/02/23   Allergies Lisinopril   Review of Systems A thorough review of systems was obtained and all systems are negative except as noted in the HPI and PMH.   Physical Exam Vital Signs  I have reviewed the triage vital signs BP (!) 152/92   Pulse 62   Temp 98.1 F (36.7 C) (Oral)   Resp 15   Ht 5' 8 (1.727 m)   Wt 117.9 kg   SpO2 100%   BMI 39.53 kg/m  Physical Exam Vitals and nursing note reviewed.  Constitutional:      General: He is not in acute distress.    Appearance: He is well-developed. He is obese.  HENT:     Head: Normocephalic and atraumatic.     Right Ear: External ear normal.     Left Ear: External ear normal.     Mouth/Throat:     Mouth: Mucous membranes are moist.  Eyes:     General: No scleral icterus. Cardiovascular:     Rate and Rhythm: Normal rate and regular rhythm.     Pulses: Normal pulses.     Heart sounds: Normal heart sounds.  Pulmonary:     Effort: Pulmonary effort is normal. No respiratory distress.     Breath sounds: Normal breath sounds.  Abdominal:     General: Abdomen is flat.     Palpations: Abdomen is soft.     Tenderness: There is abdominal tenderness.   Musculoskeletal:     Cervical back: No rigidity.     Right lower leg: No edema.     Left lower leg: No edema.  Skin:    General: Skin is warm and dry.     Capillary Refill: Capillary refill takes less than 2 seconds.  Neurological:     Mental Status: He is alert.  Psychiatric:        Mood and Affect: Mood normal.        Behavior: Behavior normal.     ED Results and Treatments Labs (all labs ordered are listed, but only abnormal results are displayed) Labs Reviewed  CBC - Abnormal; Notable for the following components:      Result Value   Hemoglobin 17.6 (*)    All other components within normal limits  COMPREHENSIVE METABOLIC PANEL WITH GFR - Abnormal; Notable for the following components:   Glucose, Bld 134 (*)    AST 46 (*)    ALT 91 (*)    All other components within normal limits  I-STAT CG4 LACTIC ACID, ED - Abnormal; Notable for the following components:   Lactic Acid, Venous 2.3 (*)    All other components within normal limits  ETHANOL  LIPASE, BLOOD  URINALYSIS, ROUTINE W REFLEX  MICROSCOPIC  I-STAT CG4 LACTIC ACID, ED  TROPONIN I (HIGH SENSITIVITY)  TROPONIN I (HIGH SENSITIVITY)                                                                                                                          Radiology CT ABDOMEN PELVIS W CONTRAST Result Date: 08/18/2023 CLINICAL DATA:  Epigastric pain. EXAM: CT ABDOMEN AND PELVIS WITH CONTRAST TECHNIQUE: Multidetector CT imaging of the abdomen and pelvis was performed using the standard protocol following bolus administration of intravenous contrast. RADIATION DOSE REDUCTION: This exam was performed according to the departmental dose-optimization program which includes automated exposure control, adjustment of the mA and/or kV according to patient size and/or use of iterative reconstruction technique. CONTRAST:  75mL OMNIPAQUE  IOHEXOL  350 MG/ML SOLN COMPARISON:  CT dated 09/08/2022. FINDINGS: Lower chest: The visualized lung  bases are clear. No intra-abdominal free air or free fluid. Hepatobiliary: The liver is unremarkable. No biliary dilatation. The gallbladder is unremarkable. Pancreas: Unremarkable. No pancreatic ductal dilatation or surrounding inflammatory changes. Spleen: Normal in size without focal abnormality. Adrenals/Urinary Tract: The adrenal glands are unremarkable. There is no hydronephrosis on either side. There is symmetric enhancement and excretion of contrast by both kidneys. The visualized ureters and urinary bladder appear unremarkable. Stomach/Bowel: There is no bowel obstruction or active inflammation. Appendectomy. Vascular/Lymphatic: A 3.7 cm partially thrombosed infrarenal abdominal aortic aneurysm. There is moderate atherosclerotic calcification of the abdominal aorta. The IVC is unremarkable. No portal venous gas. There is no adenopathy. Reproductive: The prostate and seminal vesicles are grossly unremarkable. No pelvic mass. Other: Small fat containing umbilical hernia. Musculoskeletal: Total right hip arthroplasty. No acute osseous pathology. IMPRESSION: 1. No acute intra-abdominal or pelvic pathology. 2. A 3.7 cm infrarenal abdominal aortic aneurysm. Recommend follow-up ultrasound every 3 years. (Ref.: J Vasc Surg. 2018; 67:2-77 and J Am Coll Radiol 2013;10(10):789-794.) 3.  Aortic Atherosclerosis (ICD10-I70.0). Electronically Signed   By: Vanetta Chou M.D.   On: 08/18/2023 18:34   DG Chest 2 View Result Date: 08/18/2023 CLINICAL DATA:  Chest pain EXAM: CHEST - 2 VIEW COMPARISON:  Jun 20, 2023 radiographs and prior studies FINDINGS: The heart size and mediastinal contours are unchanged. Both lungs are clear. No acute osseous findings. IMPRESSION: No acute cardiopulmonary disease. Electronically Signed   By: Michaeline Blanch M.D.   On: 08/18/2023 15:05    Pertinent labs & imaging results that were available during my care of the patient were reviewed by me and considered in my medical decision making  (see MDM for details).  Medications Ordered in ED Medications  pantoprazole  (PROTONIX ) injection 40 mg (40 mg Intravenous Given 08/18/23 1641)  alum & mag hydroxide-simeth (MAALOX/MYLANTA) 200-200-20 MG/5ML suspension 15 mL (15 mLs Oral Given 08/18/23 1641)  sodium chloride  0.9 % bolus 1,000 mL (0 mLs Intravenous Stopped 08/18/23 1904)  ipratropium-albuterol  (DUONEB) 0.5-2.5 (3) MG/3ML nebulizer solution 3 mL (3 mLs Nebulization Given 08/18/23 1717)  iohexol  (OMNIPAQUE ) 350 MG/ML injection 75 mL (75 mLs Intravenous  Contrast Given 08/18/23 1815)                                                                                                                                     Procedures Procedures  (including critical care time)  Medical Decision Making / ED Course    Medical Decision Making:    Blake Arnold is a 53 y.o. male with past medical history as below, significant for atrial fibrillation, chronic alcohol  abuse, abdominal aortic aneurysm, COPD, polysubstance abuse who presents to the ED with complaint of chest pain abdominal pain. The complaint involves an extensive differential diagnosis and also carries with it a high risk of complications and morbidity.  Serious etiology was considered. Ddx includes but is not limited to: Differential diagnosis includes but is not exclusive to acute cholecystitis, intrathoracic causes for epigastric abdominal pain, gastritis, duodenitis, pancreatitis, small bowel or large bowel obstruction, abdominal aortic aneurysm, hernia, gastritis, etc.   Complete initial physical exam performed, notably the patient was in no acute distress, HDS.    Reviewed and confirmed nursing documentation for past medical history, family history, social history.  Vital signs reviewed.     Clinical Course as of 08/18/23 1924  Mon Aug 18, 2023  1636 Etoh ++ since 7/4 1x pint every day, beer 2-3 40oz  [SG]  1852 Feeling much better [SG]  1910 3.7 cm infrarenal aortic  aneurysm w/o rupture, f/u ultrasound q3 yrs  [SG]    Clinical Course User Index [SG] Elnor Savant A, DO     CP, epigastric pain Hx ETOH abuse, daily; AAA, COPD> - LFTs mildly elevated, he is not jaundiced, no hyperbilirubinemia, does not appear to be in acute alcohol  withdrawal.  Favor alcoholic liver injury - EKG is nonischemic, troponin negative, chest x-ray unremarkable; favor noncardiac source of discomfort, atypical chest pain. - CT stable - Tolerating p.o. intake without difficulty, symptoms have resolved. - favor GI source of discomfort, was prev on PPI but stopped - advised patient cut back on his alcohol , cut back on his tobacco use. Continue to abstain from cocaine (last use Friday)  Follow with outpatient rehab resources and w/ his PCP. - Follow-up PCP for routine ultrasound of his abdominal aortic aneurysm     The patient's chest pain is not suggestive of pulmonary embolus, cardiac ischemia, aortic dissection, pericarditis, myocarditis, pulmonary embolism, pneumothorax, pneumonia, Zoster, or esophageal perforation, or other serious etiology.  Historically not abrupt in onset, tearing or ripping, pulses symmetric. EKG nonspecific for ischemia/infarction. No dysrhythmias, brugada, WPW, prolonged QT noted.   Troponin negative x2. CXR reviewed. Labs without demonstration of acute pathology unless otherwise noted above. Low HEART Score: 0-3 points (0.9-1.7% risk of MACE).  Given the extremely low risk of these diagnoses further testing and evaluation for these possibilities does not appear to be indicated at this time. Patient in no distress and overall condition improved here in the ED. Detailed discussions  were had with the patient regarding current findings, and need for close f/u with PCP or on call doctor. The patient has been instructed to return immediately if the symptoms worsen in any way for re-evaluation. Patient verbalized understanding and is in agreement with current care  plan. All questions answered prior to discharge.                Additional history obtained: -Additional history obtained from na -External records from outside source obtained and reviewed including: Chart review including previous notes, labs, imaging, consultation notes including  Primary care documentation Prior labs/imaging   Lab Tests: -I ordered, reviewed, and interpreted labs.   The pertinent results include:   Labs Reviewed  CBC - Abnormal; Notable for the following components:      Result Value   Hemoglobin 17.6 (*)    All other components within normal limits  COMPREHENSIVE METABOLIC PANEL WITH GFR - Abnormal; Notable for the following components:   Glucose, Bld 134 (*)    AST 46 (*)    ALT 91 (*)    All other components within normal limits  I-STAT CG4 LACTIC ACID, ED - Abnormal; Notable for the following components:   Lactic Acid, Venous 2.3 (*)    All other components within normal limits  ETHANOL  LIPASE, BLOOD  URINALYSIS, ROUTINE W REFLEX MICROSCOPIC  I-STAT CG4 LACTIC ACID, ED  TROPONIN I (HIGH SENSITIVITY)  TROPONIN I (HIGH SENSITIVITY)    Notable for liver enzymes +  EKG   EKG Interpretation Date/Time:    Ventricular Rate:    PR Interval:    QRS Duration:    QT Interval:    QTC Calculation:   R Axis:      Text Interpretation:           Imaging Studies ordered: I ordered imaging studies including cxr ctap I independently visualized the following imaging with scope of interpretation limited to determining acute life threatening conditions related to emergency care; findings noted above I agree with the radiologist interpretation If any imaging was obtained with contrast I closely monitored patient for any possible adverse reaction a/w contrast administration in the emergency department   Medicines ordered and prescription drug management: Meds ordered this encounter  Medications   pantoprazole  (PROTONIX ) injection 40 mg    alum & mag hydroxide-simeth (MAALOX/MYLANTA) 200-200-20 MG/5ML suspension 15 mL   sodium chloride  0.9 % bolus 1,000 mL   ipratropium-albuterol  (DUONEB) 0.5-2.5 (3) MG/3ML nebulizer solution 3 mL   iohexol  (OMNIPAQUE ) 350 MG/ML injection 75 mL   pantoprazole  (PROTONIX ) 20 MG tablet    Sig: Take 1 tablet (20 mg total) by mouth daily for 7 days.    Dispense:  7 tablet    Refill:  0   sucralfate  (CARAFATE ) 1 g tablet    Sig: Take 1 tablet (1 g total) by mouth with breakfast, with lunch, and with evening meal for 7 days.    Dispense:  21 tablet    Refill:  0    -I have reviewed the patients home medicines and have made adjustments as needed   Consultations Obtained: na   Cardiac Monitoring: The patient was maintained on a cardiac monitor.  I personally viewed and interpreted the cardiac monitored which showed an underlying rhythm of: nsr Continuous pulse oximetry interpreted by myself, 100% on ra.    Social Determinants of Health:  Diagnosis or treatment significantly limited by social determinants of health: current smoker, obesity, and alcohol  use Counseled patient for approximately  3 minutes regarding smoking cessation. Discussed risks of smoking and how they applied and affected their visit here today.  CPT code: 00593: intermediate counseling for smoking cessation     Reevaluation: After the interventions noted above, I reevaluated the patient and found that they have resolved  Co morbidities that complicate the patient evaluation  Past Medical History:  Diagnosis Date   A-fib [facility])    Abdominal aortic aneurysm (AAA) (HCC)    34mm, infrarenal in 2024. 22yr follow up surviellence   Acid reflux    Alcohol  abuse    Allergy    seasonal   Anxiety    Asthma    AVN (avascular necrosis of bone) (HCC)    Blood transfusion without reported diagnosis    during surgery   Chronic back pain    Complex tear of medial meniscus of knee 04/21/2023   COPD (chronic obstructive  pulmonary disease) (HCC)    Depression    Hepatitis C    history of   History of urinary frequency    Hyperlipidemia    Hypertension    Peripheral neuropathy    hands and feet   Polysubstance abuse (HCC) 02/09/2010   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HCC)    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HCC)    Seizures (HCC)    15 years ago   Sleep apnea       Dispostion: Disposition decision including need for hospitalization was considered, and patient discharged from emergency department.    Final Clinical Impression(s) / ED Diagnoses Final diagnoses:  Elevated liver enzymes  Acute nonspecific chest pain with low risk of coronary artery disease  Infrarenal abdominal aortic aneurysm (AAA) without rupture (HCC)        Elnor Jayson LABOR, DO 08/18/23 1922    Elnor Jayson LABOR, DO 08/18/23 1924

## 2023-08-18 NOTE — ED Notes (Signed)
 Patient transported to CT

## 2023-08-18 NOTE — ED Triage Notes (Signed)
 Pt to er pt states that he has a hx of a fib, pt pulse is strong and regular, pt states that he has had chest pain and sob for the past three days, states that he was dx with vertigo about a month ago and has also had some dizziness for the past week.

## 2023-08-21 ENCOUNTER — Ambulatory Visit: Payer: MEDICAID

## 2023-08-21 VITALS — BP 140/92 | HR 75 | Ht 68.0 in | Wt 258.6 lb

## 2023-08-21 DIAGNOSIS — Z720 Tobacco use: Secondary | ICD-10-CM

## 2023-08-21 DIAGNOSIS — F1090 Alcohol use, unspecified, uncomplicated: Secondary | ICD-10-CM

## 2023-08-21 DIAGNOSIS — M069 Rheumatoid arthritis, unspecified: Secondary | ICD-10-CM | POA: Diagnosis not present

## 2023-08-21 NOTE — Patient Instructions (Signed)
   It was great to see you!  Our plans for today:  - For Cardiology referral Please contact below number for scheduling [facility] [address] [place], [place] [phone] - We are also put a referral for Rheumatology in again. They should contact you for scheduling - Please call  [facility]: Redell Latino, MD, [facility] Address: [address], [place], [place] [address] Phone: [phone]. For possible Naltrexone  implantation    Take care and seek immediate care sooner if you develop any concerns.     Houston Coralee HAS PGY 1 Family Medicine Resident [facility]  [address] [place], [place] [address] Fax [phone] Phone [phone] 08/21/2023, 4:27 PM

## 2023-08-21 NOTE — Progress Notes (Signed)
 SUBJECTIVE:   CHIEF COMPLAINT / HPI: Hospital follow up  Mr. Deakins is a 53 y.o. male with recent ED visit on 08/17/23 for chest pain which he thought he was having a heart attack. He is with PNH of alcohol  dependence, Tobacco use, atrial fibrillation, abdominal aortic aneurysm 3.7 cm on diameter on last CT, COPD, polysubstance abuse. He is doing well since he was discharged from the ED after negative work up. He denies chest pain, palpitation, SOB, abdominal pain during this visit  Alcohol  dependence Pt with history of chronic alcohol  use. He is currently on Naltrexone  which is very effective to make him stop drinking. However he admitted that he has been not comply on taking medication because his urge to drink is greater than wanted to take the medication. Pt is highly interested to get a Neltrexone implantation. Per patient he has has drink once since his ED visit 4 days ago.   Tobacco use  Pt state smoke 1 pack of cigarette per day. He would like to also quit smoking.   Rheumatoid Arthritis(RA) Pt c/o joints pain from RA during this visit and really would like to have a referral to Rheumatologist in [place] as he normally work from 8 am - 4 pm and does not have ability to travel to his appointment outside of [place]. Pt states he had a referral to Rheumatologist I [place] unfortunately his referral was denied w/o known reason  PERTINENT  PMH / PSH:  A-fib (HCC) Abdominal aortic aneurysm (AAA) (HCC) Alcohol  abuse COPD (chronic obstructive pulmonary disease) (HCC) Tobacco use Hepatitis C HLD HTN PAD of hands and feet Polysubstance abuse (HCC) 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HCC) Seizures (HCC)15 years ago OSA   OBJECTIVE:   BP (!) 140/92   Pulse 75   Ht 5' 8 (1.727 m)   Wt 258 lb 9.6 oz (117.3 kg)   SpO2 96%   BMI 39.32 kg/m   Physical Exam Constitutional:      Appearance: Normal appearance.  Cardiovascular:     Rate and Rhythm: Normal rate and regular rhythm.      Pulses: Normal pulses.     Heart sounds: Normal heart sounds.  Pulmonary:     Effort: Pulmonary effort is normal.     Breath sounds: Normal breath sounds.  Abdominal:     General: Abdomen is flat.     Palpations: Abdomen is soft.  Neurological:     General: No focal deficit present.     Mental Status: He is alert and oriented to person, place, and time. Mental status is at baseline.  Psychiatric:        Mood and Affect: Mood normal.        Behavior: Behavior normal.        Thought Content: Thought content normal.        Judgment: Judgment normal.      ASSESSMENT/PLAN:   Mr. Brannigan is 53 YO male present to [facility] for follow up after ED visit on 08/17/23 for chest pain and tightness. CT shows known 3.7 cm diameter from 3.4 cm last year Abdominal aortic aneurysm while cardiology referral was placed earlier this year in June pt still have no been schedule his follow up visit with cardiologist. I have provided him with the cardiology clinic contact information during this visit. While pt chest discomfort has been resolved, his main concerned during this visit is alcohol  use disorder which he would like to get a Naltrexone  implantation. Assessment & Plan Rheumatoid arthritis involving both  hands, unspecified whether rheumatoid factor present [facility]) - Referral place for Rheumatology located in [place]  Alcohol  use disorder - Recommending patient to call [facility]: Redell Latino, MD, [facility] at [phone]. For possible  Naltrexone  implantation  if the clinic offered   Tobacco use - Will pending on starting Tobacco regimen until his alcohol  use disorder improved   Plan discuss with Dr. Donah, attending physician, who help formulated and agreed with plan.    Shailey Butterbaugh, DO PGY 1 Family Medicine Resident [facility]

## 2023-08-21 NOTE — Assessment & Plan Note (Signed)
-   Referral place for Rheumatology located in [place]

## 2023-08-24 ENCOUNTER — Emergency Department (HOSPITAL_COMMUNITY): Payer: MEDICAID

## 2023-08-24 ENCOUNTER — Emergency Department (HOSPITAL_COMMUNITY)
Admission: EM | Admit: 2023-08-24 | Discharge: 2023-08-24 | Disposition: A | Discharge status: Home / Self Care | Payer: MEDICAID | Attending: Emergency Medicine | Admitting: Emergency Medicine

## 2023-08-24 ENCOUNTER — Other Ambulatory Visit: Payer: Self-pay

## 2023-08-24 ENCOUNTER — Encounter (HOSPITAL_COMMUNITY): Payer: Self-pay

## 2023-08-24 DIAGNOSIS — R110 Nausea: Secondary | ICD-10-CM | POA: Diagnosis present

## 2023-08-24 DIAGNOSIS — Z7982 Long term (current) use of aspirin: Secondary | ICD-10-CM | POA: Insufficient documentation

## 2023-08-24 DIAGNOSIS — R10819 Abdominal tenderness, unspecified site: Secondary | ICD-10-CM | POA: Diagnosis not present

## 2023-08-24 LAB — COMPREHENSIVE METABOLIC PANEL WITH GFR
ALT: 49 U/L — ABNORMAL HIGH (ref 0–44)
AST: 28 U/L (ref 15–41)
Albumin: 3.9 g/dL (ref 3.5–5.0)
Alkaline Phosphatase: 51 U/L (ref 38–126)
Anion gap: 9 (ref 5–15)
BUN: 11 mg/dL (ref 6–20)
CO2: 19 mmol/L — ABNORMAL LOW (ref 22–32)
Calcium: 9.1 mg/dL (ref 8.9–10.3)
Chloride: 104 mmol/L (ref 98–111)
Creatinine, Ser: 1.06 mg/dL (ref 0.61–1.24)
GFR, Estimated: >60 mL/min (ref >60)
Glucose, Bld: 122 mg/dL — ABNORMAL HIGH (ref 70–99)
Potassium: 3.7 mmol/L (ref 3.5–5.1)
Sodium: 132 mmol/L — ABNORMAL LOW (ref 135–145)
Total Bilirubin: 0.8 mg/dL (ref 0.0–1.2)
Total Protein: 8.1 g/dL (ref 6.5–8.1)

## 2023-08-24 LAB — CBC
HCT: 51.5 % (ref 39.0–52.0)
Hemoglobin: 17.5 g/dL — ABNORMAL HIGH (ref 13.0–17.0)
MCH: 33 pg (ref 26.0–34.0)
MCHC: 34 g/dL (ref 30.0–36.0)
MCV: 97.2 fL (ref 80.0–100.0)
Platelets: 182 K/uL (ref 150–400)
RBC: 5.3 MIL/uL (ref 4.22–5.81)
RDW: 13.2 % (ref 11.5–15.5)
WBC: 7.9 K/uL (ref 4.0–10.5)
nRBC: 0 % (ref 0.0–0.2)

## 2023-08-24 LAB — URINALYSIS, ROUTINE W REFLEX MICROSCOPIC
Bilirubin Urine: NEGATIVE
Glucose, UA: NEGATIVE mg/dL
Ketones, ur: NEGATIVE mg/dL
Leukocytes,Ua: NEGATIVE
Nitrite: NEGATIVE
Protein, ur: 100 mg/dL — AB
Specific Gravity, Urine: 1.029 (ref 1.005–1.030)
pH: 5 (ref 5.0–8.0)

## 2023-08-24 LAB — LIPASE, BLOOD: Lipase: 32 U/L (ref 11–51)

## 2023-08-24 MED ORDER — IOHEXOL 350 MG/ML SOLN
75.0000 mL | Freq: Once | INTRAVENOUS | Status: AC | PRN
Start: 2023-08-24 — End: 2023-08-24
  Administered 2023-08-24: 75 mL via INTRAVENOUS

## 2023-08-24 MED ORDER — SODIUM CHLORIDE 0.9 % IV BOLUS
1000.0000 mL | Freq: Once | INTRAVENOUS | Status: AC
  Administered 2023-08-24: 1000 mL via INTRAVENOUS

## 2023-08-24 MED ORDER — ONDANSETRON HCL 4 MG/2ML IJ SOLN
4.0000 mg | Freq: Once | INTRAMUSCULAR | Status: AC
  Administered 2023-08-24: 4 mg via INTRAVENOUS
  Filled 2023-08-24: qty 2

## 2023-08-24 NOTE — ED Notes (Signed)
 Pt paper work reviewed with pt. No questions at this time. No new onset distress. Pt leaving for lobby to drive self home.

## 2023-08-24 NOTE — ED Triage Notes (Signed)
 Pt c.o lower abd pain, n/v/d since Thursday.

## 2023-08-24 NOTE — ED Provider Notes (Signed)
 [facility] EMERGENCY DEPARTMENT AT [facility] Provider Note   CSN: [id number] Arrival date & time: 08/24/23  0945     Patient presents with: Abdominal Pain, Nausea, and Diarrhea   Blake Arnold is a 53 y.o. male.   53 year old male with prior medical history as detailed below presents for evaluation.  He complains of nausea, vomiting, diarrhea.  Complains of suprapubic abdominal pain.  Symptoms began 3 days ago.  He denies fever.  He denies bloody emesis or bloody stool.  The history is provided by the patient and medical records.       Prior to Admission medications   Medication Sig Start Date End Date Taking? Authorizing Provider  albuterol  (VENTOLIN  HFA) 108 (90 Base) MCG/ACT inhaler Inhale 1-2 puffs into the lungs every 6 (six) hours as needed for wheezing or shortness of breath. 06/24/23   Theophilus Pagan, MD  aspirin  EC 81 MG tablet Take 1 tablet (81 mg total) by mouth daily. Swallow whole. 05/30/23   Theophilus Pagan, MD  cetirizine  (ZYRTEC ) 10 MG tablet Take 1 tablet (10 mg total) by mouth daily. 05/29/23   Shitarev, Dimitry, MD  diclofenac  Sodium (VOLTAREN ) 1 % GEL Apply 2 g topically 4 (four) times daily. 06/16/23   Christia Budds, MD  famotidine  (PEPCID ) 20 MG tablet Take 1 tablet (20 mg total) by mouth daily. 09/12/22   Theophilus Pagan, MD  lidocaine  (LIDODERM ) 5 % Place 1 patch onto the skin daily. Remove & Discard patch within 12 hours or as directed by MD 06/16/23   Christia Budds, MD  Methotrexate, Anti-Rheumatic, (METHOTREXATE [place]) Inject into the skin. 07/12/22   [provider]  metoprolol  tartrate (LOPRESSOR ) 25 MG tablet Take 1 tablet (25 mg total) by mouth 2 (two) times daily. 05/05/22   White, Patrice L, NP  naltrexone  (DEPADE) 50 MG tablet Take 1 tablet (50 mg total) by mouth daily. 04/21/23   Theophilus Pagan, MD  olmesartan -hydrochlorothiazide  (BENICAR  HCT) 20-12.5 MG tablet Take 1 tablet by mouth daily. 04/21/23   Theophilus Pagan, MD   pantoprazole  (PROTONIX ) 20 MG tablet Take 1 tablet (20 mg total) by mouth daily for 7 days. Patient not taking: Reported on 08/21/2023 08/18/23 08/25/23  Elnor Jayson LABOR, DO  rosuvastatin  (CRESTOR ) 20 MG tablet Take 1 tablet (20 mg total) by mouth daily. 04/21/23   Theophilus Pagan, MD  sucralfate  (CARAFATE ) 1 g tablet Take 1 tablet (1 g total) by mouth with breakfast, with lunch, and with evening meal for 7 days. 08/18/23 08/25/23  Elnor Jayson LABOR, DO  tiZANidine  (ZANAFLEX ) 4 MG tablet Take 1 tablet (4 mg total) by mouth every 6 (six) hours as needed for muscle spasms. Patient not taking: Reported on 08/21/2023 06/24/23   Theophilus Pagan, MD  umeclidinium-vilanterol (ANORO ELLIPTA ) 62.5-25 MCG/ACT AEPB Inhale 1 puff into the lungs daily. 06/24/23   Theophilus Pagan, MD    Allergies: Lisinopril     Review of Systems  All other systems reviewed and are negative.   Updated Vital Signs BP 127/87 (BP Location: Left Arm)   Pulse 85   Temp 98.2 F (36.8 C)   Resp 16   Ht 5' 8 (1.727 m)   Wt 113.4 kg   SpO2 98%   BMI 38.01 kg/m   Physical Exam Vitals and nursing note reviewed.  Constitutional:      General: He is not in acute distress.    Appearance: Normal appearance. He is well-developed.  HENT:     Head: Normocephalic and atraumatic.  Eyes:  Conjunctiva/sclera: Conjunctivae normal.     Pupils: Pupils are equal, round, and reactive to light.  Cardiovascular:     Rate and Rhythm: Normal rate and regular rhythm.     Heart sounds: Normal heart sounds.  Pulmonary:     Effort: Pulmonary effort is normal. No respiratory distress.     Breath sounds: Normal breath sounds.  Abdominal:     General: There is no distension.     Palpations: Abdomen is soft.     Tenderness: There is abdominal tenderness in the suprapubic area.  Musculoskeletal:        General: No deformity. Normal range of motion.     Cervical back: Normal range of motion and neck supple.  Skin:    General: Skin is warm and  dry.  Neurological:     General: No focal deficit present.     Mental Status: He is alert and oriented to person, place, and time.     (all labs ordered are listed, but only abnormal results are displayed) Labs Reviewed  CBC - Abnormal; Notable for the following components:      Result Value   Hemoglobin 17.5 (*)    All other components within normal limits  URINALYSIS, ROUTINE W REFLEX MICROSCOPIC - Abnormal; Notable for the following components:   Color, Urine AMBER (*)    APPearance HAZY (*)    Hgb urine dipstick SMALL (*)    Protein, ur 100 (*)    Bacteria, UA RARE (*)    All other components within normal limits  LIPASE, BLOOD  COMPREHENSIVE METABOLIC PANEL WITH GFR    EKG: None  Radiology: No results found.   Procedures   Medications Ordered in the ED  sodium chloride  0.9 % bolus 1,000 mL (has no administration in time range)  ondansetron  (ZOFRAN ) injection 4 mg (has no administration in time range)                                    Medical Decision Making Amount and/or Complexity of Data Reviewed Labs: ordered. Radiology: ordered.  Risk Prescription drug management.    Medical Screen Complete  This patient presented to the ED with complaint of abdominal pain, nausea, vomiting.  This complaint involves an extensive number of treatment options. The initial differential diagnosis includes, but is not limited to, enteritis, colitis, metabolic abnormality, other intra-abdominal  This presentation is: Acute, Chronic, Self-Limited, Previously Undiagnosed, Uncertain Prognosis, Complicated, Systemic Symptoms, and Threat to Life/Bodily Function  Patient presents with nausea, vomiting, diarrhea, abdominal cramps.  IV fluids he feels much improved.  Screening labs and imaging are without significant acute abnormality identified.  Patient feels proved and desires discharge.  Importance of close follow-up stressed.  Strict precautions given  understood. Problem List / ED Course:  Abdominal pain, nausea, vomiting     Disposition:  After consideration of the diagnostic results and the patients response to treatment, I feel that the patent would benefit from close outpatient follow-up.       Final diagnoses:  Nausea    ED Discharge Orders     None          Laurice Maude BROCKS, MD 08/24/23 1623

## 2023-08-24 NOTE — Discharge Instructions (Signed)
 Return for any problem.  ?

## 2023-09-01 ENCOUNTER — Ambulatory Visit (HOSPITAL_COMMUNITY)
Admission: RE | Admit: 2023-09-01 | Discharge: 2023-09-01 | Disposition: A | Discharge status: Home / Self Care | Payer: MEDICAID | Source: Ambulatory Visit | Attending: Dermatology | Admitting: Dermatology

## 2023-09-01 DIAGNOSIS — R229 Localized swelling, mass and lump, unspecified: Secondary | ICD-10-CM | POA: Insufficient documentation

## 2023-09-03 ENCOUNTER — Ambulatory Visit: Payer: MEDICAID | Admitting: Dermatology

## 2023-09-05 ENCOUNTER — Other Ambulatory Visit: Payer: Self-pay

## 2023-09-05 DIAGNOSIS — I10 Essential (primary) hypertension: Secondary | ICD-10-CM

## 2023-09-05 MED ORDER — OLMESARTAN MEDOXOMIL-HCTZ 20-12.5 MG PO TABS: 1.0000 | ORAL_TABLET | Freq: Every day | ORAL | 1 refills | Status: DC

## 2023-09-05 NOTE — Telephone Encounter (Signed)
 Patient calls nurse line requesting a refill on olmesartan -hydrochlorothiazide .  Advised will forward to PCP for refill.

## 2023-09-08 ENCOUNTER — Encounter: Payer: MEDICAID | Attending: Physical Medicine and Rehabilitation | Admitting: Physical Medicine and Rehabilitation

## 2023-09-08 ENCOUNTER — Encounter: Payer: Self-pay | Admitting: Physical Medicine and Rehabilitation

## 2023-09-08 VITALS — BP 138/87 | HR 85 | Ht 68.0 in | Wt 257.0 lb

## 2023-09-08 DIAGNOSIS — G622 Polyneuropathy due to other toxic agents: Secondary | ICD-10-CM | POA: Insufficient documentation

## 2023-09-08 MED ORDER — CAPSAICIN-CLEANSING GEL 8 % EX KIT
1.0000 | PACK | Freq: Once | CUTANEOUS | Status: AC
  Administered 2023-09-08: 1 via TOPICAL

## 2023-09-08 NOTE — Progress Notes (Signed)
-  Discussed Qutenza  as an option for neuropathic pain control. Discussed that this is a capsaicin  patch, stronger than capsaicin  cream. Discussed that it is currently approved for diabetic peripheral neuropathy and post-herpetic neuralgia, but that it has also shown benefit in treating other forms of neuropathy. Provided patient with link to site to learn more about the patch: https://www.qutenza .com/. Discussed that the patch would be placed in office and benefits usually last 3 months. Discussed that unintended exposure to capsaicin  can cause severe irritation of eyes, mucous membranes, respiratory tract, and skin, but that Qutenza  is a local treatment and does not have the systemic side effects of other nerve medications. Discussed that there may be pain, itching, erythema, and decreased sensory function associated with the application of Qutenza . Side effects usually subside within 1 week. A cold pack of analgesic medications can help with these side effects. Blood pressure can also be increased due to pain associated with administration of the patch.   1 patch of Qutenza  [phone]) was cut in half and half patch was applied to each knee. Ice packs were applied during the procedure to ensure patient comfort. Blood pressure was monitored every 15 minutes. The patient tolerated the procedure well. Post-procedure instructions were given and follow-up has been scheduled.  Topical system measures 14cm x20cm (280cm for a total 1120units) were applied which will cause deeper penetration for destruction of the peripheral nerve using a chemical (Qutenza ) which infuses into the skin like an injection and heat technique (occlusive, compressive dressing cauing endothermic heat technique)

## 2023-09-11 ENCOUNTER — Other Ambulatory Visit: Payer: Self-pay | Admitting: Family Medicine

## 2023-09-11 DIAGNOSIS — R0600 Dyspnea, unspecified: Secondary | ICD-10-CM

## 2023-09-18 ENCOUNTER — Ambulatory Visit (INDEPENDENT_AMBULATORY_CARE_PROVIDER_SITE_OTHER): Payer: MEDICAID | Admitting: Family Medicine

## 2023-09-18 VITALS — BP 118/74 | HR 95 | Wt 256.0 lb

## 2023-09-18 DIAGNOSIS — R0600 Dyspnea, unspecified: Secondary | ICD-10-CM | POA: Diagnosis not present

## 2023-09-18 DIAGNOSIS — R918 Other nonspecific abnormal finding of lung field: Secondary | ICD-10-CM | POA: Diagnosis not present

## 2023-09-18 DIAGNOSIS — Z0000 Encounter for general adult medical examination without abnormal findings: Secondary | ICD-10-CM

## 2023-09-18 NOTE — Progress Notes (Cosign Needed Addendum)
    SUBJECTIVE:   CHIEF COMPLAINT / HPI:   Patient states he had some shortness of breath and wheezing a few days ago.  States his inhaler was in his car.  Since then, his symptoms have improved he has been using the inhaler as needed.  States he uses the albuterol  inhaler 3x a week.  Denies fevers, weight changes, other respiratory symptoms.  States he does have some night sweats but states his home stays very warm.  Reports some ?lung nodules that were found recently, cannot find more information about this in his chart.  Had PFTs done in the past, states this was at [facility].  Interested in getting these done again.  PERTINENT  PMH / PSH: A-fib, HTN, AAA, OSA, GERD, alcohol  abuse, RA, tobacco abuse  OBJECTIVE:   BP 118/74   Pulse 95   Wt 256 lb (116.1 kg)   SpO2 97%   BMI 38.92 kg/m   General: Awake and conversant, no acute distress Respiratory: CTAB, normal work of breathing on room air, speaking in full sentences Psych: Appropriate mood and affect Neuro: Ambulating normally, no focal deficits  ASSESSMENT/PLAN:   Assessment & Plan Dyspnea, unspecified type Episode of shortness of breath and wheezing a few days ago.  Has now fully resolved.  Patient has normal vitals and oxygen saturations.  Uses albuterol  as needed, states he uses it 3 times a week at most.  Scheduled appointment with Dr. Koval for PFTs next week.  Patient also requested follow-up appoint with PCP, this was scheduled today. --Reviewed return precautions --Recommend PCP following up on ?lung nodules that were found on recent imaging at upcoming appointment Healthcare maintenance Advised shingles vaccine, provided information in AVS     Rea Raring, MD [facility]

## 2023-09-18 NOTE — Patient Instructions (Signed)
 Thank you for coming in today! Here is a summary of what we discussed:  -You can get the Shingrix vaccine at the pharmacy. You will need a 2nd shot 2-6 months after the first. This vaccine is important to help prevent shingles.    -You have an appt with Dr Koval to test your lung function  -You have a follow up with your PCP   Please call the clinic at [phone] if your symptoms worsen or you have any concerns.  Best, Dr Adele

## 2023-09-19 NOTE — Addendum Note (Signed)
 Addended byBETHA ADELE SONG on: 09/19/2023 09:17 AM   Modules accepted: Orders

## 2023-09-23 ENCOUNTER — Ambulatory Visit (INDEPENDENT_AMBULATORY_CARE_PROVIDER_SITE_OTHER): Payer: MEDICAID | Admitting: Pharmacist

## 2023-09-23 ENCOUNTER — Encounter: Payer: Self-pay | Admitting: Pharmacist

## 2023-09-23 VITALS — BP 138/89 | HR 87 | Ht 69.5 in | Wt 252.6 lb

## 2023-09-23 DIAGNOSIS — J439 Emphysema, unspecified: Secondary | ICD-10-CM | POA: Diagnosis not present

## 2023-09-23 DIAGNOSIS — R0600 Dyspnea, unspecified: Secondary | ICD-10-CM

## 2023-09-23 DIAGNOSIS — F17200 Nicotine dependence, unspecified, uncomplicated: Secondary | ICD-10-CM | POA: Diagnosis not present

## 2023-09-23 DIAGNOSIS — J449 Chronic obstructive pulmonary disease, unspecified: Secondary | ICD-10-CM | POA: Insufficient documentation

## 2023-09-23 MED ORDER — VARENICLINE TARTRATE 1 MG PO TABS: 1.0000 mg | ORAL_TABLET | Freq: Two times a day (BID) | ORAL | 2 refills | Status: AC

## 2023-09-23 MED ORDER — UMECLIDINIUM-VILANTEROL 62.5-25 MCG/ACT IN AEPB: 1.0000 | INHALATION_SPRAY | Freq: Every day | RESPIRATORY_TRACT | 1 refills | Status: DC

## 2023-09-23 NOTE — Patient Instructions (Addendum)
 Nice to see you today! Your goal is to decrease cigarette intake to less than 10 cigarettes (0.5 pack per day) by next visit in 4 weeks.   Medication Changes: - START Chantix  (varenicline ) 1 mg 1 tab daily with food for 7 days, then increasing 1 tab twice daily with food. - Continue all other medication the same.   Tobacco Patient Instructions Quitting smoking is one of the most important decisions you can make for your current and future health. Consider what you dislike about smoking and how quitting could personally benefit you. Try to cut down.   Aim for reducing the amount you smoke by 10 cigarettes over the next 4 weeks.  My target quit date is: 9/31/2025   Starting today, Be a Quitter!  Remind yourself why you want to quit.  Delay your first cigarette of the day for as long as possible.  Start cleaning out all pockets, drawers, and your car of cigarettes.  Getting Through the Cravings Once You Are Smoke Free: Each craving will last about 10 minutes, whether or not you smoke. Here's how to get through the cravings without cigarettes:  DELAY: Tell yourself that you'll wait for the next craving. Do it every time! DEEP BREATHS: One reason smoking feels good is because you breathe in deeply to inhale. Take four slow, deep breaths and feel the relaxation without the hamful effects of cigarettes. DRINK WATER: Drink a glass of cool water. It will give your hands and mouth something to do and will help flush the nicotine  out of your system faster. DIVERT: Do something else -- brush your teeth, take a walk, call a friend who can offer you support. Just moving onto something other than thinking about cigarettes will move you through the craving.   Frequently Asked Questions  What can I do when I get the urge to smoke? To get through the urge to smoke, try the following:  Review your reasons for quitting and think of all the benefits to your health, your finances, and your family.   Remind yourself that there is no such thing as just one cigarette -- or even one puff.  Ride out the desire to smoke. Use the 4 Os -- Delay, Deep Breaths, Drink Water and Divert to get you through. The craving will go away eventually. Do not fool yourself into thinking you can have just one cigarette.  Any tips on how to deal with stress? Stress is a natural part of life. The key is to deal with it without reaching for a cigarette. Taking deep breaths, counting backwards from 10 and asking yourself 1-how big a deal is this?"  Writing down your feelings, talking with a friend and doing things like positive self-talk and meditation are some other ways that people deal with daily stress.  What if I start smoking again? Slips happen. Most people try to quit smoking a few times before they are successful. Don't beat yourself up if this happens to you! Ask yourself if this was a slip or a relapse. A slip is a one-time mistake that is quickly corrected. A relapse is going back to your old smoking habits.   If you slip, don't give up. Think of it as a learning experience. Ask yourself what went wrong and renew your commitment to staying away from smoking for good.  If you relapse, try not to get discouraged. Ask yourself the question "What caused me to start smoking?" Figure out what helped you and what didn't  when you tried to quit. Knowing why you relapsed is useful information for your next attempt to quit.  Please bring all medications to your clinic visits.  Please arrive 10-15 minutes prior to your scheduled visit time.

## 2023-09-23 NOTE — Assessment & Plan Note (Signed)
 Tobacco use disorder with severe nicotine  dependence of 39 years duration currently smoking 1 ppd in a patient who is good candidate for success because of internal motivation to quit and previous successful quit attempts with bupropion  and varenicline . Patient reports vivid dreams with varenicline , denies nightmares. Reports would like to quit by trip to [place] in October 2025.  -Initiated Chantix  (varenicline ) 1 mg 1 tab daily with food for 7 days, then increasing to 1 tab twice daily with food. Patient counseled on purpose, proper use, and potential adverse effects, including vivid dreams.  -Plan to follow-up with me in 4 weeks. Plan to reduce from 1 ppd to 0.5 ppd by time of next visit. Goal quit date 9/31/25.

## 2023-09-23 NOTE — Progress Notes (Signed)
 S:     Chief Complaint  Patient presents with   Medication Management    PFTs and Tobacco Cessation   53 y.o. male who presents for diabetes evaluation, education, and management. Patient arrives in good spirits and presents without any assistance.   Patient was referred and last seen by Primary Care Provider, Dr. Adele, on 09/18/23.  At last visit, patient reported shortness of breath and wheezing.   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   PMH is significant for COPD, tobacco use disorder, alcohol  use disorder,  AFib, hypertension, AAA, OSA, GERD, RA.   Patient reports having to stop to catch his breath when walking by himself and when walking up 1 flight of stairs. Reports using Anoro inhaler daily, denies use today. Reports using albuterol  as needed (3 times per week). Reports both inhalers help a little.  Medication adherence reported good. Patient reports last dose of COPD medications was yesterday. Current COPD medications: Anoro Ellipta  (umeclidinium-vilanterol) 62.5/25 mcg/act 1 inh daily, albuterol  108 mcg/act 1-2 inh Q6H PRN  Rescue inhaler use frequency: 3 times a week  Age when started using tobacco on a daily basis: 53 yo. Reports when using alcohol  smokes 2 ppd. Brand smoked: [employer] 100s for past 2 years because they are cheaper. Previously smoked Marlboro Reds. Number of cigarettes/day: 1 ppd. Denies waking to smoke. Reports previous history of waking to smoke multiple times a night when smoking 2 ppd.  Reports sleeping ~7 hours per night. Reports waiting until at least an hour or longer after waking to smoke.  Most recent quit attempt: Reports attempting to quit multiple times. Longest time ever been tobacco free 6 months.  Medications used in past cessation efforts include:  -bupropion  - reports working well until he stopped taking it -varenicline  - reports vivid dreams but no nightmares. Reports Wellbutrin   (bupropion ) working better than Chantix  (varenicline )  Rates IMPORTANCE of quitting tobacco on 1-10 scale of 10. Rates CONFIDENCE of quitting tobacco on 1-10 scale of 7.  Most common triggers to use tobacco include: reports increased intake when drinking alcohol , seeing cigarettes, being around friends or family that smoke. Denies smoking in car, Reports smoking outside at home - no other smokers living in home. Reports biggest barrier to quitting is work.  Reports 6-7 years of alcohol  free with use of naltrexone . Currently still using naltrexone , but has been drinking alcohol . Last alcoholic beverage 4 days ago (Saturday).  Motivation to quit: Reports desire to quit smoking and drinking ASAP after learning of lung nodules. Reports it would make him feel great to quit smoking. Reports his breathing improved during previous quit attempts.   Quit line was used in the past, however, patient found this to be stressful due to them calling too often.  Not interested in trying or utilizing the quit line again.   O: Review of Systems  Respiratory:  Positive for shortness of breath and wheezing.     Physical Exam Constitutional:      Appearance: Normal appearance.  Neurological:     Mental Status: He is alert.  Psychiatric:        Mood and Affect: Mood normal.        Behavior: Behavior normal.        Thought Content: Thought content normal.        Judgment: Judgment normal.     Vitals:   09/23/23 1408  BP: 138/89  Pulse: 87  SpO2: 98%  mMRC score = 2 CAT score = 23   See Documentation Flowsheet - CAT/COPD for complete symptom scoring.  See scanned report or Documentation Flowsheet (discrete results - PFTs) for Spirometry results. Patient provided good effort while attempting spirometry.   Lung Age = 53 years old Albuterol  Neb  Lot# [id number]     Exp. 06/2024   A/P: Patient has been experiencing shortness of breath and dyspnea on exertion reports previously diagnosed  with COPD at [facility]. Taking Anoro daily and albuterol  as needed (3 times a week). Spirometry evaluation reveals moderate obstruction, post nebulized albuterol  tx revealed mild obstruction with 8-9% improvement, indicative of non-reversible obstructive lung disease. Spirometry [facility] based on CAT score 23 and mMRC of 2. -Reviewed results of pulmonary function tests.  Pt verbalized understanding of results and education.   -Plan to continue Anoro Ellipta  daily and albuterol  PRN. Plan to focus on tobacco cessation rather than increasing to Trelegy at this time. Reevaluate tobacco cessation and need for LAMA/LABA/ICS combo inhaler at next PCP visit. -Refill of Anoro Ellipta  sent to pharmacy.  Tobacco use disorder with severe nicotine  dependence of 39 years duration currently smoking 1 ppd in a patient who is good candidate for success because of internal motivation to quit and previous successful quit attempts with bupropion  and varenicline . Patient reports vivid dreams with varenicline , denies nightmares. Reports would like to quit by trip to [place] in October 2025.  -Initiated Chantix  (varenicline ) 1 mg 1 tab daily with food for 7 days, then increasing to 1 tab twice daily with food. Patient counseled on purpose, proper use, and potential adverse effects, including vivid dreams.  -Plan to follow-up with me in 4 weeks. Plan to reduce from 1 ppd to 0.5 ppd by time of next visit. Goal quit date 9/31/25.    Written patient instructions provided.   Total time in face to face counseling 43 minutes.    Follow-up:  Pharmacist TBD (phone call in 4 weeks prior to PCP visit) PCP clinic visit 10/29/23  Patient seen with Fonda Blase, PharmD Candidate - PY3 student and Calton Nash, PharmD Candidate - PY4 student.

## 2023-09-23 NOTE — Assessment & Plan Note (Signed)
 Patient has been experiencing shortness of breath and dyspnea on exertion reports previously diagnosed with COPD at [facility]. Taking Anoro daily and albuterol  as needed (3 times a week). Spirometry evaluation reveals moderate obstruction, post nebulized albuterol  tx revealed mild obstruction with 8-9% improvement, indicative of non-reversible obstructive lung disease. Spirometry [facility] based on CAT score 23 and mMRC of 2. -Reviewed results of pulmonary function tests.  Pt verbalized understanding of results and education.   -Plan to continue Anoro Ellipta  daily and albuterol  PRN. Plan to focus on tobacco cessation rather than increasing to Trelegy at this time. Reevaluate tobacco cessation and need for LAMA/LABA/ICS combo inhaler at next PCP visit. -Refill of Anoro Ellipta  sent to pharmacy.

## 2023-09-24 NOTE — Progress Notes (Signed)
 Reviewed and agree with Dr Rennis plan.

## 2023-10-14 ENCOUNTER — Ambulatory Visit: Payer: MEDICAID | Admitting: Dermatology

## 2023-10-16 ENCOUNTER — Ambulatory Visit: Payer: MEDICAID | Attending: Cardiology | Admitting: Cardiology

## 2023-10-16 ENCOUNTER — Encounter: Payer: Self-pay | Admitting: Cardiology

## 2023-10-16 VITALS — BP 125/63 | HR 93 | Resp 16 | Ht 69.0 in | Wt 254.2 lb

## 2023-10-16 DIAGNOSIS — I700 Atherosclerosis of aorta: Secondary | ICD-10-CM | POA: Diagnosis not present

## 2023-10-16 DIAGNOSIS — I4891 Unspecified atrial fibrillation: Secondary | ICD-10-CM

## 2023-10-16 DIAGNOSIS — R0609 Other forms of dyspnea: Secondary | ICD-10-CM

## 2023-10-16 DIAGNOSIS — F1029 Alcohol dependence with unspecified alcohol-induced disorder: Secondary | ICD-10-CM | POA: Diagnosis present

## 2023-10-16 DIAGNOSIS — I4819 Other persistent atrial fibrillation: Secondary | ICD-10-CM

## 2023-10-16 DIAGNOSIS — F17200 Nicotine dependence, unspecified, uncomplicated: Secondary | ICD-10-CM

## 2023-10-16 DIAGNOSIS — I10 Essential (primary) hypertension: Secondary | ICD-10-CM | POA: Diagnosis present

## 2023-10-16 DIAGNOSIS — G4733 Obstructive sleep apnea (adult) (pediatric): Secondary | ICD-10-CM

## 2023-10-16 DIAGNOSIS — I2510 Atherosclerotic heart disease of native coronary artery without angina pectoris: Secondary | ICD-10-CM

## 2023-10-16 DIAGNOSIS — I714 Abdominal aortic aneurysm, without rupture, unspecified: Secondary | ICD-10-CM | POA: Diagnosis present

## 2023-10-16 DIAGNOSIS — Z9889 Other specified postprocedural states: Secondary | ICD-10-CM | POA: Diagnosis present

## 2023-10-16 DIAGNOSIS — I2584 Coronary atherosclerosis due to calcified coronary lesion: Secondary | ICD-10-CM | POA: Diagnosis not present

## 2023-10-16 DIAGNOSIS — E782 Mixed hyperlipidemia: Secondary | ICD-10-CM

## 2023-10-16 NOTE — Patient Instructions (Signed)
 Medication Instructions:  Your physician recommends that you continue on your current medications as directed. Please refer to the Current Medication list given to you today.  *If you need a refill on your cardiac medications before your next appointment, please call your pharmacy*   Testing/Procedures: Your physician has requested that you have an echocardiogram. Echocardiography is a painless test that uses sound waves to create images of your heart. It provides your doctor with information about the size and shape of your heart and how well your heart's chambers and valves are working. This procedure takes approximately one hour. There are no restrictions for this procedure. Please do NOT wear cologne, perfume, aftershave, or lotions (deodorant is allowed). Please arrive 15 minutes prior to your appointment time.  Please note: We ask at that you not bring children with you during ultrasound (echo/ vascular) testing. Due to room size and safety concerns, children are not allowed in the ultrasound rooms during exams. Our front office staff cannot provide observation of children in our lobby area while testing is being conducted. An adult accompanying a patient to their appointment will only be allowed in the ultrasound room at the discretion of the ultrasound technician under special circumstances. We apologize for any inconvenience.    Dr. Michele has ordered a Myocardial Perfusion Imaging Study.   The test will take approximately 3 to 4 hours to complete; you may bring reading material.  If someone comes with you to your appointment, they will need to remain in the main lobby due to limited space in the testing area. **If you are pregnant or breastfeeding, please notify the nuclear lab prior to your appointment**  You will need to hold the following medications prior to your stress test: beta-blockers (24 hours prior to test)   How to prepare for your Myocardial Perfusion Test: Do not eat or  drink 3 hours prior to your test, except you may have water. Do not consume products containing caffeine (regular or decaffeinated) 12 hours prior to your test. (ex: coffee, chocolate, sodas, tea). Do wear comfortable clothes (no dresses or overalls) and walking shoes, tennis shoes preferred (No heels or open toe shoes are allowed). Do NOT wear cologne, perfume, aftershave, or lotions (deodorant is allowed). If these instructions are not followed, your test will have to be rescheduled.    Follow-Up: At [facility], you and your health needs are our priority.  As part of our continuing mission to provide you with exceptional heart care, our providers are all part of one team.  This team includes your primary Cardiologist (physician) and Advanced Practice Providers or APPs (Physician Assistants and Nurse Practitioners) who all work together to provide you with the care you need, when you need it.  Your next appointment:   6 month(s)  Provider:   Dr. Michele   We recommend signing up for the patient portal called MyChart.  Sign up information is provided on this After Visit Summary.  MyChart is used to connect with patients for Virtual Visits (Telemedicine).  Patients are able to view lab/test results, encounter notes, upcoming appointments, etc.  Non-urgent messages can be sent to your provider as well.   To learn more about what you can do with MyChart, go to [email or web address].

## 2023-10-16 NOTE — Progress Notes (Signed)
 Cardiology Office Note:    Date:  10/16/2023  NAME:  Blake Arnold    MRN: [id number] DOB:  [birth year]   PCP:  Theophilus Pagan, MD  Former Cardiology Providers: NA Primary Cardiologist:  Madonna Large, DO, [facility] (established care 10/16/2023) Electrophysiologist:  None   Referring MD: Donah Laymon PARAS, MD  Reason of Consult: Coronary artery calcification  Chief Complaint  Patient presents with   New Patient (Initial Visit)    Coronary calcification    History of Present Illness:    Blake Arnold is a 53 y.o. Caucasian male whose past medical history and cardiovascular risk factors includes: 3.7 cm infrarenal abdominal aortic aneurysm (CT Abdomen 08/2023), Hypertension, rheumatoid arthritis, prior alcohol  use, mood disorder, persistent atrial fibrillation/history of atrial fibrillation ablation in 2008, OSA, BPPV, cigarette smoking, emphysema, history of crack/cocaine. He is being seen today for the evaluation of coronary calcification at the request of Donah Laymon PARAS, MD.  In March 2025 patient had a CT scan for lung cancer screening and was noted to have two-vessel coronary artery calcification as well as aortic atherosclerosis.  Given these findings and multiple cardiovascular risk factors he is referred to cardiology for further evaluation and management.  According to medical records patient has a history atrial fibrillation dating back to 2008, felt to be persistent, status post ablation. He is not on anticoagulation for thromboembolic prophylaxis secondary to his alcohol  use disorder (patient reconfirmed this).  Patient denies anginal chest pain. Review of systems positive for shortness of breath.  Predominantly with effort related activities. Denies orthopnea, PND, or lower extremity swelling.  No structured exercise program or daily routine.  Has a history of sleep apnea but not on CPAP. Currently smokes at least 0.5 packs/day Consumes at least 6 pack of alcohol   beverages per day. History of cocaine and crack, has been drug-free.  Currently on naltrexone . Last fall was approximately 6 months ago which resulted in broken ribs. No prior history of gastrointestinal intracranial bleeding.  Current Medications: Current Meds  Medication Sig   albuterol  (VENTOLIN  HFA) 108 (90 Base) MCG/ACT inhaler INHALE 1 TO 2 PUFFS INTO THE LUNGS EVERY 6 HOURS AS NEEDED FOR WHEEZING OR SHORTNESS OF BREATH   aspirin  EC 81 MG tablet Take 1 tablet (81 mg total) by mouth daily. Swallow whole.   cetirizine  (ZYRTEC ) 10 MG tablet Take 1 tablet (10 mg total) by mouth daily. (Patient taking differently: Take 1 tablet (10 mg total) by mouth daily.)   diclofenac  Sodium (VOLTAREN ) 1 % GEL Apply 2 g topically 4 (four) times daily. (Patient taking differently: Apply 2 g topically 4 (four) times daily.)   famotidine  (PEPCID ) 20 MG tablet Take 1 tablet (20 mg total) by mouth daily. (Patient taking differently: Take 1 tablet (20 mg total) by mouth daily.)   lidocaine  (LIDODERM ) 5 % Place 1 patch onto the skin daily. Remove & Discard patch within 12 hours or as directed by MD   naltrexone  (DEPADE) 50 MG tablet Take 1 tablet (50 mg total) by mouth daily.   olmesartan -hydrochlorothiazide  (BENICAR  HCT) 20-12.5 MG tablet Take 1 tablet by mouth daily.   rosuvastatin  (CRESTOR ) 20 MG tablet Take 1 tablet (20 mg total) by mouth daily.   umeclidinium-vilanterol (ANORO ELLIPTA ) 62.5-25 MCG/ACT AEPB Inhale 1 puff into the lungs daily.   varenicline  (CHANTIX ) 1 MG tablet Take 1 tablet (1 mg total) by mouth 2 (two) times daily.     Allergies:    Lisinopril    Past Medical History: Past  Medical History:  Diagnosis Date   A-fib [facility])    Abdominal aortic aneurysm (AAA) (HCC)    34mm, infrarenal in 2024. 76yr follow up surviellence   Acid reflux    Alcohol  abuse    Allergy    seasonal   Anxiety    Asthma    AVN (avascular necrosis of bone) (HCC)    Blood transfusion without reported diagnosis     during surgery   Chronic back pain    Complex tear of medial meniscus of knee 04/21/2023   COPD (chronic obstructive pulmonary disease) (HCC)    Depression    Hepatitis C    history of   History of urinary frequency    Hyperlipidemia    Hypertension    Peripheral neuropathy    hands and feet   Polysubstance abuse (HCC) 02/09/2010   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HCC)    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HCC)    Seizures (HCC)    15 years ago   Sleep apnea     Past Surgical History: Past Surgical History:  Procedure Laterality Date   APPENDECTOMY     CARDIOVERSION  03/07/2006   CARPAL TUNNEL RELEASE     CERVICAL FUSION     LAPAROSCOPIC APPENDECTOMY N/A 07/23/2017   Procedure: APPENDECTOMY LAPAROSCOPIC;  Surgeon: Sebastian Moles, MD;  Location: [facility] OR;  Service: General;  Laterality: N/A;   TOTAL HIP ARTHROPLASTY Right 2016    Social History: Social History   Tobacco Use   Smoking status: Every Day    Current packs/day: 0.50    Average packs/day: 1 pack/day for 39.7 years (39.5 ttl pk-yrs)    Types: Cigarettes    Start date: 02/12/1984    Passive exposure: Current   Smokeless tobacco: Former    Quit date: 10/24/2012   Tobacco comments:    1.5-2ppd  Quit attempt 12/24/2018    0.5 ppd, quit March 2025, resumed April 2025    Currently smoking 1 ppd as of August 2025  Vaping Use   Vaping status: Never Used  Substance Use Topics   Alcohol  use: Not Currently    Comment: pint a day   Drug use: Not Currently    Types: Crack cocaine, Cocaine    Comment: 03/02/23    Family History: Family History  Problem Relation Age of Onset   Heart attack Mother    Atrial fibrillation Mother    Lung cancer Mother    Skin cancer Father    Colon polyps Neg Hx    Esophageal cancer Neg Hx    Stomach cancer Neg Hx    Rectal cancer Neg Hx    Colon cancer Neg Hx    Crohn's disease Neg Hx    Ulcerative colitis Neg Hx     ROS:   Review of Systems  Cardiovascular:  Positive for dyspnea on  exertion. Negative for chest pain, claudication, irregular heartbeat, leg swelling, near-syncope, orthopnea, palpitations, paroxysmal nocturnal dyspnea and syncope.  Hematologic/Lymphatic: Negative for bleeding problem.   Risk Assessment/Calculations:   Click Here to Calculate/Change CHADS2VASc Score The patient's CHADS2-VASc score is 2, indicating a 2.2% annual risk of stroke.   CHF History: No HTN History: Yes Diabetes History: No Stroke History: No Vascular Disease History: Yes    EKGs/Labs/Other Studies Reviewed:   EKG: EKG Interpretation Date/Time:  Thursday October 16 2023 13:30:08 EDT Ventricular Rate:  93 PR Interval:  176 QRS Duration:  92 QT Interval:  364 QTC Calculation: 452 R Axis:   36  Text Interpretation: Normal sinus  rhythm Consider Anteroseptal infarct (cited on or before 05-Mar-2023) When compared with ECG of 18-Aug-2023 14:03, No significant change since last tracing Confirmed by Michele Richardson [phone]) on 10/16/2023 1:51:26 PM  Cardiac monitor 04/2023 HR 47 - 129, average 77 bpm. Rare supraventricular and ventricular ectopy. No sustained arrhythmias. No atrial fibrillation.  Labs:    Latest Ref Rng & Units 08/24/2023    9:53 AM 08/18/2023    2:10 PM 04/21/2023    3:42 PM  CBC  WBC 4.0 - 10.5 K/uL 7.9  8.6  8.6   Hemoglobin 13.0 - 17.0 g/dL 82.4  82.3  82.6   Hematocrit 39.0 - 52.0 % 51.5  50.5  47.3   Platelets 150 - 400 K/uL 182  240  211        Latest Ref Rng & Units 08/24/2023    9:53 AM 08/18/2023    2:10 PM 04/21/2023    3:42 PM  BMP  Glucose 70 - 99 mg/dL 877  865  887   BUN 6 - 20 mg/dL 11  12  8    Creatinine 0.61 - 1.24 mg/dL 8.93  9.11  9.22   BUN/Creat Ratio 9 - 20   10   Sodium 135 - 145 mmol/L 132  137  138   Potassium 3.5 - 5.1 mmol/L 3.7  3.9  4.4   Chloride 98 - 111 mmol/L 104  102  99   CO2 22 - 32 mmol/L 19  24  23    Calcium  8.9 - 10.3 mg/dL 9.1  9.2  9.5       Latest Ref Rng & Units 08/24/2023    9:53 AM 08/18/2023    2:10 PM  04/21/2023    3:42 PM  CMP  Glucose 70 - 99 mg/dL 877  865  887   BUN 6 - 20 mg/dL 11  12  8    Creatinine 0.61 - 1.24 mg/dL 8.93  9.11  9.22   Sodium 135 - 145 mmol/L 132  137  138   Potassium 3.5 - 5.1 mmol/L 3.7  3.9  4.4   Chloride 98 - 111 mmol/L 104  102  99   CO2 22 - 32 mmol/L 19  24  23    Calcium  8.9 - 10.3 mg/dL 9.1  9.2  9.5   Total Protein 6.5 - 8.1 g/dL 8.1  7.5  7.2   Total Bilirubin 0.0 - 1.2 mg/dL 0.8  0.9  0.6   Alkaline Phos 38 - 126 U/L 51  54  66   AST 15 - 41 U/L 28  46  31   ALT 0 - 44 U/L 49  91  61     Lab Results  Component Value Date   CHOL 192 04/21/2023   HDL 48 04/21/2023   LDLCALC 96 04/21/2023   LDLDIRECT 76 05/02/2022   TRIG 284 (H) 04/21/2023   CHOLHDL 4.0 04/21/2023   No results for input(s): LIPOA in the last 8760 hours. No components found for: NTPROBNP No results for input(s): PROBNP in the last 8760 hours. No results for input(s): TSH in the last 8760 hours.  Physical Exam:    Today's Vitals   10/16/23 1326  BP: 125/63  Pulse: 93  Resp: 16  SpO2: 93%  Weight: 254 lb 3.2 oz (115.3 kg)  Height: 5' 9 (1.753 m)   Body mass index is 37.54 kg/m. Wt Readings from Last 3 Encounters:  10/16/23 254 lb 3.2 oz (115.3 kg)  09/23/23 252 lb 9.6  oz (114.6 kg)  09/18/23 256 lb (116.1 kg)    Physical Exam  Constitutional: No distress.  hemodynamically stable  HENT:  Poor dentition  Neck: No JVD present.  Cardiovascular: Normal rate, regular rhythm, S1 normal and S2 normal. Exam reveals no gallop, no S3 and no S4.  No murmur heard. Pulmonary/Chest: Effort normal and breath sounds normal. No stridor. He has no wheezes. He has no rales.  Musculoskeletal:        General: No edema.     Cervical back: Neck supple.  Skin: Skin is warm.     Impression & Recommendation(s):  Impression:   ICD-10-CM   1. Coronary atherosclerosis due to calcified coronary lesion  I25.10 EKG 12-Lead   I25.84 ECHOCARDIOGRAM COMPLETE    MYOCARDIAL  PERFUSION IMAGING    Cardiac Stress Test: Informed Consent Details: Physician/Practitioner Attestation; Transcribe to consent form and obtain patient signature    2. Atherosclerosis of aorta (HCC)  I70.0     3. Dyspnea on exertion  R06.09 ECHOCARDIOGRAM COMPLETE    MYOCARDIAL PERFUSION IMAGING    Cardiac Stress Test: Informed Consent Details: Physician/Practitioner Attestation; Transcribe to consent form and obtain patient signature    4. Persistent atrial fibrillation (HCC)  I48.19 ECHOCARDIOGRAM COMPLETE    MYOCARDIAL PERFUSION IMAGING    Cardiac Stress Test: Informed Consent Details: Physician/Practitioner Attestation; Transcribe to consent form and obtain patient signature    5. History of cardiac ablation for atrial fibrillation (HCC)  Z98.890    I48.91     6. Abdominal aortic aneurysm (AAA) 30 to 34 mm in diameter (HCC)  I71.40     7. Benign hypertension  I10 ECHOCARDIOGRAM COMPLETE    MYOCARDIAL PERFUSION IMAGING    Cardiac Stress Test: Informed Consent Details: Physician/Practitioner Attestation; Transcribe to consent form and obtain patient signature    8. Mixed hyperlipidemia  E78.2     9. Alcohol  dependence with unspecified alcohol -induced disorder (HCC)  F10.29     10. TOBACCO ABUSE  F17.200     11. OSA (obstructive sleep apnea)  G47.33        Recommendation(s):  Coronary atherosclerosis due to calcified coronary lesion Atherosclerosis of aorta (HCC) Noted on nongated CT study. Denies anginal chest pain. Shortness of breath predominantly with effort related activities could be multifactorial but ischemia cannot be ruled out given his symptoms and risk factors Echo will be ordered to evaluate for structural heart disease and left ventricular systolic function. Exercise nuclear stress test to evaluate her functional capacity and exercise-induced arrhythmia/ischemia Continue aspirin  81 mg p.o. daily. Continue Crestor  20 mg p.o. nightly. Recommend a goal LDL at least  <70 mg/dL and if possible closer to 55 mg/dL  Dyspnea on exertion Multifactorial: Obesity hypoventilatory syndrome, undiagnosed COPD/emphysema, CAD?,  Etc. Ischemic workup as discussed above. Defer remainder of the management to PCP  Persistent atrial fibrillation (HCC) History of cardiac ablation for atrial fibrillation (HCC) Rate control: N/A. Thromboembolic prophylaxis: Aspirin  Given his CHA2DS2-VASc SCORE would benefit from anticoagulation. However, prior to establishing care patient was told that he is not a candidate for anticoagulation given the amount of alcohol  consumption/dependence. I informed him that aspirin  is not strong enough for thromboembolic prophylaxis and he is at risk of stroke.  If he has symptoms concerning for stroke as discussed at today's visit he needs to seek medical attention by going to the closest ER via EMS for further evaluation and management. Patient today also refuses to be on anticoagulation. For safety if he chooses to be on  anticoagulation it would be beneficial to to have him see GI to make sure he does not have esophageal varices that would predispose him to bleeding.    Abdominal aortic aneurysm (AAA) 30 to 34 mm in diameter (HCC) 3.7 cm infrarenal abdominal aortic aneurysm, noted on CT of the abdomen 08/24/2023 Radiology recommends follow-up ultrasound in 3 years, was to July 2028, will defer to PCP to arrange the follow-up ultrasound. Re emphasized importance of complete smoking cessation Continue lipid-lowering agents. Patient is asked to seek medical attention if he has symptoms consistent with AAA, as discussed at today's visit  Benign hypertension Office blood pressures are well-controlled. Continue olmesartan /HCTZ 20/12.5 mg p.o. daily  Mixed hyperlipidemia Continue Crestor  20 mg p.o. nightly. Cardiology following peripherally, managed by primary care provider.  Alcohol  dependence with unspecified alcohol -induced disorder  (HCC) Currently drinks at least a sixpack per day Reemphasized importance of complete smoking cessation as alcohol  consumption is linked to A-fib  TOBACCO ABUSE Smokes approximately 0.5 packs/day. Currently on Chantix   OSA (obstructive sleep apnea) Reemphasized the importance of being reevaluated as untreated sleep apnea risk factor for A-fib Patient states that he will follow-up with PCP for further guidance.  Orders Placed:  Orders Placed This Encounter  Procedures   Cardiac Stress Test: Informed Consent Details: Physician/Practitioner Attestation; Transcribe to consent form and obtain patient signature    Physician/Practitioner attestation of informed consent for procedure/surgical case:   I, the physician/practitioner, attest that I have discussed with the patient the benefits, risks, side effects, alternatives, likelihood of achieving goals and potential problems during recovery for the procedure that I have provided informed consent.    Procedure:   myocardial perfusion imaging    Indication/Reason:   dyspnea on exertion and elevated coronary artery calcium  score   MYOCARDIAL PERFUSION IMAGING    Standing Status:   Future    Patient weight in lbs:   254    Where [facility]    Type of stress:   Exercise   EKG 12-Lead   ECHOCARDIOGRAM COMPLETE    Standing Status:   Future    Expiration Date:   10/15/2024    Where should this test be performed:   [facility]    Does the patient weigh less than or greater than 250 lbs?:   Patient weighs less than 250 lbs    Perflutren DEFINITY (image enhancing agent) should be administered unless hypersensitivity or allergy exist:   Administer Perflutren    Reason for exam-Echo:   Other-Full Diagnosis List    Full ICD-10/Reason for Exam:   Shortness of breath [786.05.ICD-9-CM]    Full ICD-10/Reason for Exam:   Elevated coronary artery calcium  score [8658748]     Final Medication List:   No orders  of the defined types were placed in this encounter.   Medications Discontinued During This Encounter  Medication Reason   Methotrexate, Anti-Rheumatic, (METHOTREXATE [place]) Patient Preference   Methotrexate, Anti-Rheumatic, (METHOTREXATE [place]) Patient Preference     Current Outpatient Medications:    albuterol  (VENTOLIN  HFA) 108 (90 Base) MCG/ACT inhaler, INHALE 1 TO 2 PUFFS INTO THE LUNGS EVERY 6 HOURS AS NEEDED FOR WHEEZING OR SHORTNESS OF BREATH, Disp: 18 g, Rfl: 3   aspirin  EC 81 MG tablet, Take 1 tablet (81 mg total) by mouth daily. Swallow whole., Disp: 30 tablet, Rfl: 12   cetirizine  (ZYRTEC ) 10 MG tablet, Take 1 tablet (10 mg total) by mouth daily. (Patient taking differently:  Take 1 tablet (10 mg total) by mouth daily.), Disp: 30 tablet, Rfl: 11   diclofenac  Sodium (VOLTAREN ) 1 % GEL, Apply 2 g topically 4 (four) times daily. (Patient taking differently: Apply 2 g topically 4 (four) times daily.), Disp: 50 g, Rfl: 3   famotidine  (PEPCID ) 20 MG tablet, Take 1 tablet (20 mg total) by mouth daily. (Patient taking differently: Take 1 tablet (20 mg total) by mouth daily.), Disp: 30 tablet, Rfl: 2   lidocaine  (LIDODERM ) 5 %, Place 1 patch onto the skin daily. Remove & Discard patch within 12 hours or as directed by MD, Disp: 30 patch, Rfl: 0   naltrexone  (DEPADE) 50 MG tablet, Take 1 tablet (50 mg total) by mouth daily., Disp: 90 tablet, Rfl: 1   olmesartan -hydrochlorothiazide  (BENICAR  HCT) 20-12.5 MG tablet, Take 1 tablet by mouth daily., Disp: 30 tablet, Rfl: 1   rosuvastatin  (CRESTOR ) 20 MG tablet, Take 1 tablet (20 mg total) by mouth daily., Disp: 90 tablet, Rfl: 3   umeclidinium-vilanterol (ANORO ELLIPTA ) 62.5-25 MCG/ACT AEPB, Inhale 1 puff into the lungs daily., Disp: 60 each, Rfl: 1   varenicline  (CHANTIX ) 1 MG tablet, Take 1 tablet (1 mg total) by mouth 2 (two) times daily., Disp: 60 tablet, Rfl: 2  Consent:   Informed Consent   Shared Decision Making/Informed Consent The risks [chest  pain, shortness of breath, cardiac arrhythmias, dizziness, blood pressure fluctuations, myocardial infarction, stroke/transient ischemic attack, nausea, vomiting, allergic reaction, radiation exposure, metallic taste sensation and life-threatening complications (estimated to be 1 in 10,000)], benefits (risk stratification, diagnosing coronary artery disease, treatment guidance) and alternatives of a nuclear stress test were discussed in detail with Mr. Burley and he agrees to proceed.     Disposition:   1 year follow-up sooner if needed Patient may be asked to follow-up sooner based on the results of the above-mentioned testing.  His questions and concerns were addressed to his satisfaction. He voices understanding of the recommendations provided during this encounter.    Signed, Madonna Michele HAS, [facility]  A Division of [facility] [address] [place], [place] [address]  10/16/2023

## 2023-10-20 ENCOUNTER — Ambulatory Visit
Admission: RE | Admit: 2023-10-20 | Discharge: 2023-10-20 | Disposition: A | Discharge status: Home / Self Care | Payer: MEDICAID | Source: Ambulatory Visit | Attending: Family Medicine

## 2023-10-20 VITALS — BP 142/87 | HR 88 | Temp 98.0°F | Resp 18

## 2023-10-20 DIAGNOSIS — A084 Viral intestinal infection, unspecified: Secondary | ICD-10-CM

## 2023-10-20 DIAGNOSIS — R197 Diarrhea, unspecified: Secondary | ICD-10-CM | POA: Diagnosis not present

## 2023-10-20 DIAGNOSIS — R112 Nausea with vomiting, unspecified: Secondary | ICD-10-CM | POA: Diagnosis not present

## 2023-10-20 MED ORDER — ONDANSETRON 8 MG PO TBDP: 8.0000 mg | ORAL_TABLET | Freq: Three times a day (TID) | ORAL | 0 refills | Status: AC | PRN

## 2023-10-20 MED ORDER — LOPERAMIDE HCL 2 MG PO CAPS: 2.0000 mg | ORAL_CAPSULE | Freq: Two times a day (BID) | ORAL | 0 refills | Status: AC | PRN

## 2023-10-20 NOTE — Discharge Instructions (Addendum)
 Make sure you push fluids drinking mostly water but mix it with Gatorade.  Try to eat light meals including soups, broths and soft foods, fruits.  You may use Zofran for your nausea and vomiting once every 8 hours.  Imodium can help with diarrhea but use this carefully limiting it to 1-2 times per day only if you are having a lot of diarrhea.  Please return to the clinic if symptoms worsen or you start having severe abdominal pain not helped by taking Tylenol or start having bloody stools or blood in the vomit.

## 2023-10-20 NOTE — ED Provider Notes (Signed)
 [facility]  Note:  This document was prepared using Conservation officer, historic buildings and may include unintentional dictation errors.  MRN: [id number] DOB: 1970-11-27  Subjective:   Blake Arnold is a 53 y.o. male presenting for 3-day history of nausea, vomiting, multiple episodes of diarrhea, decreased appetite.  Has felt general malaise and fatigue because he had a hard time eating anything.  He has been able to hold fluids down.  Had multiple exposures last week.  Has not been drinking alcohol .  Patient is working with a GI specialist for intermittent abdominal pains.  No current facility-administered medications for this encounter.  Current Outpatient Medications:    albuterol  (VENTOLIN  HFA) 108 (90 Base) MCG/ACT inhaler, INHALE 1 TO 2 PUFFS INTO THE LUNGS EVERY 6 HOURS AS NEEDED FOR WHEEZING OR SHORTNESS OF BREATH, Disp: 18 g, Rfl: 3   aspirin  EC 81 MG tablet, Take 1 tablet (81 mg total) by mouth daily. Swallow whole., Disp: 30 tablet, Rfl: 12   cetirizine  (ZYRTEC ) 10 MG tablet, Take 1 tablet (10 mg total) by mouth daily. (Patient taking differently: Take 1 tablet (10 mg total) by mouth daily.), Disp: 30 tablet, Rfl: 11   diclofenac  Sodium (VOLTAREN ) 1 % GEL, Apply 2 g topically 4 (four) times daily. (Patient taking differently: Apply 2 g topically 4 (four) times daily.), Disp: 50 g, Rfl: 3   famotidine  (PEPCID ) 20 MG tablet, Take 1 tablet (20 mg total) by mouth daily. (Patient taking differently: Take 1 tablet (20 mg total) by mouth daily.), Disp: 30 tablet, Rfl: 2   lidocaine  (LIDODERM ) 5 %, Place 1 patch onto the skin daily. Remove & Discard patch within 12 hours or as directed by MD, Disp: 30 patch, Rfl: 0   naltrexone  (DEPADE) 50 MG tablet, Take 1 tablet (50 mg total) by mouth daily., Disp: 90 tablet, Rfl: 1   olmesartan -hydrochlorothiazide  (BENICAR  HCT) 20-12.5 MG tablet, Take 1 tablet by mouth daily., Disp: 30 tablet, Rfl: 1   rosuvastatin  (CRESTOR ) 20  MG tablet, Take 1 tablet (20 mg total) by mouth daily., Disp: 90 tablet, Rfl: 3   umeclidinium-vilanterol (ANORO ELLIPTA ) 62.5-25 MCG/ACT AEPB, Inhale 1 puff into the lungs daily., Disp: 60 each, Rfl: 1   varenicline  (CHANTIX ) 1 MG tablet, Take 1 tablet (1 mg total) by mouth 2 (two) times daily., Disp: 60 tablet, Rfl: 2   Allergies  Allergen Reactions   Lisinopril  Swelling and Other (See Comments)    Swelling of the lips    Past Medical History:  Diagnosis Date   A-fib (HCC)    Abdominal aortic aneurysm (AAA) (HCC)    34mm, infrarenal in 2024. 57yr follow up surviellence   Acid reflux    Alcohol  abuse    Allergy    seasonal   Anxiety    Asthma    AVN (avascular necrosis of bone) (HCC)    Blood transfusion without reported diagnosis    during surgery   Chronic back pain    Complex tear of medial meniscus of knee 04/21/2023   COPD (chronic obstructive pulmonary disease) (HCC)    Depression    Hepatitis C    history of   History of urinary frequency    Hyperlipidemia    Hypertension    Peripheral neuropathy    hands and feet   Polysubstance abuse (HCC) 02/09/2010   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HCC)    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HCC)    Seizures (HCC)    15 years ago   Sleep apnea  Past Surgical History:  Procedure Laterality Date   APPENDECTOMY     CARDIOVERSION  03/07/2006   CARPAL TUNNEL RELEASE     CERVICAL FUSION     LAPAROSCOPIC APPENDECTOMY N/A 07/23/2017   Procedure: APPENDECTOMY LAPAROSCOPIC;  Surgeon: Sebastian Moles, MD;  Location: [facility] OR;  Service: General;  Laterality: N/A;   TOTAL HIP ARTHROPLASTY Right 2016    Family History  Problem Relation Age of Onset   Heart attack Mother    Atrial fibrillation Mother    Lung cancer Mother    Skin cancer Father    Colon polyps Neg Hx    Esophageal cancer Neg Hx    Stomach cancer Neg Hx    Rectal cancer Neg Hx    Colon cancer Neg Hx    Crohn's disease Neg Hx    Ulcerative colitis Neg Hx     Social History    Tobacco Use   Smoking status: Every Day    Current packs/day: 0.50    Average packs/day: 1 pack/day for 39.7 years (39.5 ttl pk-yrs)    Types: Cigarettes    Start date: 02/12/1984    Passive exposure: Current   Smokeless tobacco: Former    Quit date: 10/24/2012   Tobacco comments:    1.5-2ppd  Quit attempt 12/24/2018    0.5 ppd, quit March 2025, resumed April 2025    Currently smoking 1 ppd as of August 2025  Vaping Use   Vaping status: Never Used  Substance Use Topics   Alcohol  use: Yes    Comment: 4-5 beers daily   Drug use: Not Currently    Types: Crack cocaine, Cocaine    Comment: 03/02/23    ROS   Objective:   Vitals: BP (!) 142/87 (BP Location: Right Arm)   Pulse 88   Temp 98 F (36.7 C) (Oral)   Resp 18   SpO2 95%   Physical Exam Constitutional:      General: He is not in acute distress.    Appearance: Normal appearance. He is well-developed and normal weight. He is not ill-appearing, toxic-appearing or diaphoretic.  HENT:     Head: Normocephalic and atraumatic.     Right Ear: External ear normal.     Left Ear: External ear normal.     Nose: Nose normal.     Mouth/Throat:     Pharynx: Oropharynx is clear.  Eyes:     General: No scleral icterus.       Right eye: No discharge.        Left eye: No discharge.     Extraocular Movements: Extraocular movements intact.  Cardiovascular:     Rate and Rhythm: Normal rate.  Pulmonary:     Effort: Pulmonary effort is normal.  Abdominal:     General: Bowel sounds are increased. There is no distension.     Palpations: Abdomen is soft. There is no mass.     Tenderness: There is abdominal tenderness (mild, generalized). There is no right CVA tenderness, left CVA tenderness, guarding or rebound.  Musculoskeletal:     Cervical back: Normal range of motion.  Neurological:     Mental Status: He is alert and oriented to person, place, and time.  Psychiatric:        Mood and Affect: Mood normal.        Behavior:  Behavior normal.        Thought Content: Thought content normal.        Judgment: Judgment normal.  Assessment and Plan :   PDMP not reviewed this encounter.  1. Viral gastroenteritis   2. Nausea and vomiting, unspecified vomiting type   3. Diarrhea, unspecified type    No signs of an acute abdomen.  Will manage for suspected viral gastroenteritis with supportive care.  Recommended patient hydrate well, eat light meals and maintain electrolytes.  Will use Zofran  and Imodium  for nausea, vomiting and diarrhea. Counseled patient on potential for adverse effects with medications prescribed/recommended today, ER and return-to-clinic precautions discussed, patient verbalized understanding.    Christopher Savannah, [place] 10/20/23 [phone]

## 2023-10-20 NOTE — ED Triage Notes (Signed)
 Pt reports nausea, fatigue and diarrhea x 3 days. Pt has not taken nay meds for complaints.

## 2023-10-28 NOTE — Progress Notes (Unsigned)
    SUBJECTIVE:   CHIEF COMPLAINT / HPI:   COPD Doing well on Anoro daily and albuterol  as needed.  Working with Dr. Koval for smoking cessation, currently on Chantix .  OSA-CPAP?  Was not using previously  PERTINENT  PMH / PSH: ***  OBJECTIVE:   There were no vitals taken for this visit. ***  General: NAD, pleasant, able to participate in exam Cardiac: RRR, no murmurs. Respiratory: CTAB, normal effort, No wheezes, rales or rhonchi Abdomen: Bowel sounds present, nontender, nondistended Extremities: no edema or cyanosis. Skin: warm and dry, no rashes noted Neuro: alert, no obvious focal deficits Psych: Normal affect and mood  ASSESSMENT/PLAN:   No problem-specific Assessment & Plan notes found for this encounter.     Dr. Izetta Nap, DO [facility]    {    This will disappear when note is signed, click to select method of visit    :1}

## 2023-10-29 ENCOUNTER — Ambulatory Visit: Payer: MEDICAID | Admitting: Family Medicine

## 2023-10-29 ENCOUNTER — Encounter: Payer: Self-pay | Admitting: Family Medicine

## 2023-10-29 VITALS — BP 118/91 | HR 78 | Ht 68.0 in | Wt 252.2 lb

## 2023-10-29 DIAGNOSIS — G4730 Sleep apnea, unspecified: Secondary | ICD-10-CM | POA: Diagnosis not present

## 2023-10-29 DIAGNOSIS — I10 Essential (primary) hypertension: Secondary | ICD-10-CM | POA: Diagnosis not present

## 2023-10-29 DIAGNOSIS — R918 Other nonspecific abnormal finding of lung field: Secondary | ICD-10-CM

## 2023-10-29 DIAGNOSIS — F1090 Alcohol use, unspecified, uncomplicated: Secondary | ICD-10-CM | POA: Diagnosis not present

## 2023-10-29 DIAGNOSIS — D751 Secondary polycythemia: Secondary | ICD-10-CM

## 2023-10-29 DIAGNOSIS — Z23 Encounter for immunization: Secondary | ICD-10-CM

## 2023-10-29 DIAGNOSIS — I2510 Atherosclerotic heart disease of native coronary artery without angina pectoris: Secondary | ICD-10-CM | POA: Diagnosis not present

## 2023-10-29 DIAGNOSIS — F17200 Nicotine dependence, unspecified, uncomplicated: Secondary | ICD-10-CM

## 2023-10-29 DIAGNOSIS — S0502XA Injury of conjunctiva and corneal abrasion without foreign body, left eye, initial encounter: Secondary | ICD-10-CM

## 2023-10-29 DIAGNOSIS — I4891 Unspecified atrial fibrillation: Secondary | ICD-10-CM

## 2023-10-29 LAB — POCT GLYCOSYLATED HEMOGLOBIN (HGB A1C): Hemoglobin A1C: 5.7 % — AB (ref 4.0–5.6)

## 2023-10-29 MED ORDER — NALTREXONE HCL 50 MG PO TABS: 50.0000 mg | ORAL_TABLET | Freq: Every day | ORAL | 1 refills | Status: AC

## 2023-10-29 MED ORDER — ERYTHROMYCIN 5 MG/GM OP OINT: 1.0000 | TOPICAL_OINTMENT | Freq: Three times a day (TID) | OPHTHALMIC | 0 refills | Status: AC

## 2023-10-29 NOTE — Patient Instructions (Addendum)
 It was wonderful to see you today! Thank you for choosing [facility].   Please bring ALL of your medications with you to every visit.   Today we talked about:  I do think we need a better control of your sleep apnea.  I did refer you to see lung doctor to have a sleep study and consideration for different sleep apnea treatment since you do not tolerate the CPAP.  I do think this will help with your other cardiac symptoms and help prevent heart disease long-term. Please continue to cut back on the amount of alcohol  you use.  I did prescribe you the naltrexone  to your pharmacy.  Ideally you would abstain from alcohol  altogether as this probably multiple ways. Please continue on the Chantix  and work on smoking cessation.  Any reduction in smoking is beneficial particularly for the aneurysm you have.  Please continue to work with Dr. Koval to set goals for reducing amount of cigarettes you use. Please continue to take your blood pressure medicine and check at home if possible.  Please continue take your medication and as we discussed any reduction in smoking and alcohol  use will help with your blood pressure management. You did have some lung nodules that were noted on imaging in July, we do need to repeat a CT of your chest sometime this fall. Please follow-up with your oncologist as scheduled, you can continue to use Tylenol  as needed for pain management but I would try to avoid ibuprofen  given you are on aspirin  and with your alcohol  use.   Please follow up in 2 months   Call the clinic at [phone] if your symptoms worsen or you have any concerns.  Please be sure to schedule follow up at the front desk before you leave today.   Izetta Nap, DO Family Medicine

## 2023-10-29 NOTE — Assessment & Plan Note (Signed)
 118/91 upon repeat, continue current therapy

## 2023-10-29 NOTE — Assessment & Plan Note (Signed)
 RRR on exam today.  Cardiology recommending anticoagulation and follow-up with GI for EGD for possible esophageal varices prior to starting but patient declined.  Will continue discussion and consider referral to GI if patient agreeable to future.

## 2023-10-29 NOTE — Assessment & Plan Note (Signed)
 Has cut back some and following with Dr. Koval for tobacco cessation.  On Chantix , continue to encourage setting goals for reduction use.

## 2023-11-03 ENCOUNTER — Encounter (HOSPITAL_COMMUNITY): Payer: Self-pay | Admitting: *Deleted

## 2023-11-07 ENCOUNTER — Other Ambulatory Visit: Payer: Self-pay | Admitting: Cardiology

## 2023-11-07 DIAGNOSIS — I10 Essential (primary) hypertension: Secondary | ICD-10-CM

## 2023-11-07 DIAGNOSIS — R0609 Other forms of dyspnea: Secondary | ICD-10-CM

## 2023-11-07 DIAGNOSIS — I4819 Other persistent atrial fibrillation: Secondary | ICD-10-CM

## 2023-11-07 DIAGNOSIS — I2510 Atherosclerotic heart disease of native coronary artery without angina pectoris: Secondary | ICD-10-CM

## 2023-11-11 ENCOUNTER — Ambulatory Visit (HOSPITAL_COMMUNITY)
Admission: RE | Admit: 2023-11-11 | Discharge: 2023-11-11 | Disposition: A | Discharge status: Home / Self Care | Payer: MEDICAID | Source: Ambulatory Visit | Attending: Cardiovascular Disease | Admitting: Cardiovascular Disease

## 2023-11-11 DIAGNOSIS — I2584 Coronary atherosclerosis due to calcified coronary lesion: Secondary | ICD-10-CM | POA: Diagnosis present

## 2023-11-11 DIAGNOSIS — I4819 Other persistent atrial fibrillation: Secondary | ICD-10-CM | POA: Insufficient documentation

## 2023-11-11 DIAGNOSIS — R0609 Other forms of dyspnea: Secondary | ICD-10-CM | POA: Insufficient documentation

## 2023-11-11 DIAGNOSIS — I2510 Atherosclerotic heart disease of native coronary artery without angina pectoris: Secondary | ICD-10-CM | POA: Diagnosis not present

## 2023-11-11 DIAGNOSIS — I10 Essential (primary) hypertension: Secondary | ICD-10-CM | POA: Diagnosis present

## 2023-11-11 LAB — ECHOCARDIOGRAM COMPLETE
Area-P 1/2: 3.85 cm2
S' Lateral: 3.1 cm

## 2023-11-12 ENCOUNTER — Ambulatory Visit (HOSPITAL_COMMUNITY)
Admission: RE | Admit: 2023-11-12 | Discharge: 2023-11-12 | Disposition: A | Discharge status: Home / Self Care | Payer: MEDICAID | Source: Ambulatory Visit | Attending: Cardiovascular Disease | Admitting: Cardiovascular Disease

## 2023-11-12 ENCOUNTER — Encounter (HOSPITAL_COMMUNITY): Payer: Self-pay | Admitting: *Deleted

## 2023-11-12 ENCOUNTER — Other Ambulatory Visit: Payer: Self-pay | Admitting: Family Medicine

## 2023-11-12 DIAGNOSIS — I2510 Atherosclerotic heart disease of native coronary artery without angina pectoris: Secondary | ICD-10-CM

## 2023-11-12 DIAGNOSIS — I10 Essential (primary) hypertension: Secondary | ICD-10-CM

## 2023-11-12 DIAGNOSIS — R0609 Other forms of dyspnea: Secondary | ICD-10-CM

## 2023-11-12 DIAGNOSIS — I4819 Other persistent atrial fibrillation: Secondary | ICD-10-CM

## 2023-11-13 ENCOUNTER — Ambulatory Visit
Admission: RE | Admit: 2023-11-13 | Discharge: 2023-11-13 | Disposition: A | Discharge status: Home / Self Care | Payer: MEDICAID

## 2023-11-13 ENCOUNTER — Encounter: Payer: Self-pay | Admitting: Family Medicine

## 2023-11-13 ENCOUNTER — Ambulatory Visit (HOSPITAL_COMMUNITY): Payer: Self-pay

## 2023-11-13 VITALS — BP 122/83 | HR 83 | Temp 98.4°F | Resp 20 | Ht 68.0 in | Wt 252.0 lb

## 2023-11-13 DIAGNOSIS — B349 Viral infection, unspecified: Secondary | ICD-10-CM | POA: Diagnosis not present

## 2023-11-13 LAB — POC COVID19/FLU A&B COMBO
Covid Antigen, POC: NEGATIVE
Influenza A Antigen, POC: NEGATIVE
Influenza B Antigen, POC: NEGATIVE

## 2023-11-13 MED ORDER — PROMETHAZINE-DM 6.25-15 MG/5ML PO SYRP: 10.0000 mL | ORAL_SOLUTION | Freq: Three times a day (TID) | ORAL | 0 refills | Status: DC | PRN

## 2023-11-13 MED ORDER — AZELASTINE HCL 0.1 % NA SOLN: 1.0000 | Freq: Two times a day (BID) | NASAL | 1 refills | Status: AC

## 2023-11-13 NOTE — Discharge Instructions (Signed)
  1. Acute viral syndrome (Primary) - POC Covid19/Flu A&B Antigen complete in UC is negative for COVID and influenza. - azelastine (ASTELIN) 0.1 % nasal spray; Place 1 spray into both nostrils 2 (two) times daily. Use in each nostril as directed  Dispense: 30 mL; Refill: 1 - promethazine -dextromethorphan (PROMETHAZINE -DM) 6.25-15 MG/5ML syrup; Take 10 mLs by mouth 3 (three) times daily as needed.  Dispense: 240 mL; Refill: 0 -Continue to monitor symptoms for any change in severity if there is any escalation of current symptoms or development of new symptoms follow-up in ER for further evaluation and management.

## 2023-11-13 NOTE — ED Triage Notes (Signed)
 Patient reports onset of symptoms beginning last night with nausea. This morning, experienced transient shortness of breath while getting into the bathtub, which resolved spontaneously. Subsequently noted loss of taste after eating. Reports mild headache, body aches, chills, and loose stools throughout the day. Denies fever.

## 2023-11-13 NOTE — ED Provider Notes (Signed)
 [facility]  Note:  This document was prepared using Dragon voice recognition software and may include unintentional dictation errors.  MRN: [id number] DOB: 06-08-1970  Subjective:   Blake Arnold is a 53 y.o. male presenting for new onset nausea that started last night and shortness of breath, headache, body aches, chills, loose stools and loss of taste that occurred today.  Patient reports that this morning he was taking a bath when he stood up he felt extremely short of breath, symptoms only lasted for a few seconds and then subsided.  Patient denies any recurrence of symptoms.  Still having loss of taste, body aches, headache, chills and multiple bouts of diarrhea today.  Patient denies any fever, sore throat, weakness, dizziness, chest pain.  Patient denies any known sick contacts but states he does work around a lot of other people and may have been exposed at work.  No current facility-administered medications for this encounter.  Current Outpatient Medications:    azelastine (ASTELIN) 0.1 % nasal spray, Place 1 spray into both nostrils 2 (two) times daily. Use in each nostril as directed, Disp: 30 mL, Rfl: 1   metoprolol  tartrate (LOPRESSOR ) 25 MG tablet, Take 25 mg by mouth 2 (two) times daily., Disp: , Rfl:    pantoprazole  (PROTONIX ) 20 MG tablet, Take 20 mg by mouth daily., Disp: , Rfl:    promethazine -dextromethorphan (PROMETHAZINE -DM) 6.25-15 MG/5ML syrup, Take 10 mLs by mouth 3 (three) times daily as needed., Disp: 240 mL, Rfl: 0   sucralfate  (CARAFATE ) 1 g tablet, Take 1 g by mouth as directed., Disp: , Rfl:    tiZANidine  (ZANAFLEX ) 4 MG tablet, Take 4 mg by mouth every 6 (six) hours as needed for muscle spasms., Disp: , Rfl:    albuterol  (VENTOLIN  HFA) 108 (90 Base) MCG/ACT inhaler, INHALE 1 TO 2 PUFFS INTO THE LUNGS EVERY 6 HOURS AS NEEDED FOR WHEEZING OR SHORTNESS OF BREATH, Disp: 18 g, Rfl: 3   aspirin  EC 81 MG tablet, Take 1 tablet (81 mg total) by  mouth daily. Swallow whole., Disp: 30 tablet, Rfl: 12   cetirizine  (ZYRTEC ) 10 MG tablet, Take 1 tablet (10 mg total) by mouth daily. (Patient taking differently: Take 1 tablet (10 mg total) by mouth daily.), Disp: 30 tablet, Rfl: 11   diclofenac  Sodium (VOLTAREN ) 1 % GEL, Apply 2 g topically 4 (four) times daily. (Patient taking differently: Apply 2 g topically 4 (four) times daily.), Disp: 50 g, Rfl: 3   famotidine  (PEPCID ) 20 MG tablet, Take 1 tablet (20 mg total) by mouth daily. (Patient taking differently: Take 1 tablet (20 mg total) by mouth daily.), Disp: 30 tablet, Rfl: 2   lidocaine  (LIDODERM ) 5 %, Place 1 patch onto the skin daily. Remove & Discard patch within 12 hours or as directed by MD, Disp: 30 patch, Rfl: 0   loperamide  (IMODIUM ) 2 MG capsule, Take 1 capsule (2 mg total) by mouth 2 (two) times daily as needed for diarrhea or loose stools., Disp: 14 capsule, Rfl: 0   naltrexone  (DEPADE) 50 MG tablet, Take 1 tablet (50 mg total) by mouth daily., Disp: 90 tablet, Rfl: 1   olmesartan -hydrochlorothiazide  (BENICAR  HCT) 20-12.5 MG tablet, TAKE 1 TABLET BY MOUTH DAILY, Disp: 90 tablet, Rfl: 1   ondansetron  (ZOFRAN -ODT) 8 MG disintegrating tablet, Take 1 tablet (8 mg total) by mouth every 8 (eight) hours as needed for nausea or vomiting., Disp: 20 tablet, Rfl: 0   rosuvastatin  (CRESTOR ) 20 MG tablet, Take 1 tablet (20  mg total) by mouth daily., Disp: 90 tablet, Rfl: 3   umeclidinium-vilanterol (ANORO ELLIPTA ) 62.5-25 MCG/ACT AEPB, Inhale 1 puff into the lungs daily., Disp: 60 each, Rfl: 1   varenicline  (CHANTIX ) 1 MG tablet, Take 1 tablet (1 mg total) by mouth 2 (two) times daily., Disp: 60 tablet, Rfl: 2   Allergies  Allergen Reactions   Lisinopril  Swelling and Other (See Comments)    Swelling of the lips    Past Medical History:  Diagnosis Date   A-fib (HCC)    Abdominal aortic aneurysm (AAA)    34mm, infrarenal in 2024. 19yr follow up surviellence   Acid reflux    Alcohol  abuse     Allergy    seasonal   Anxiety    Asthma    AVN (avascular necrosis of bone) (HCC)    Blood transfusion without reported diagnosis    during surgery   Chronic back pain    Complex tear of medial meniscus of knee 04/21/2023   COPD (chronic obstructive pulmonary disease) (HCC)    Depression    Hepatitis C    history of   History of urinary frequency    Hyperlipidemia    Hypertension    Peripheral neuropathy    hands and feet   Polysubstance abuse (HCC) 02/09/2010   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HCC)    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HCC)    Seizures (HCC)    15 years ago   Sleep apnea      Past Surgical History:  Procedure Laterality Date   APPENDECTOMY     CARDIOVERSION  03/07/2006   CARPAL TUNNEL RELEASE     CERVICAL FUSION     LAPAROSCOPIC APPENDECTOMY N/A 07/23/2017   Procedure: APPENDECTOMY LAPAROSCOPIC;  Surgeon: Sebastian Moles, MD;  Location: [facility] OR;  Service: General;  Laterality: N/A;   TOTAL HIP ARTHROPLASTY Right 2016    Family History  Problem Relation Age of Onset   Heart attack Mother    Atrial fibrillation Mother    Lung cancer Mother    Skin cancer Father    Colon polyps Neg Hx    Esophageal cancer Neg Hx    Stomach cancer Neg Hx    Rectal cancer Neg Hx    Colon cancer Neg Hx    Crohn's disease Neg Hx    Ulcerative colitis Neg Hx     Social History   Tobacco Use   Smoking status: Every Day    Current packs/day: 0.50    Average packs/day: 1 pack/day for 39.8 years (39.5 ttl pk-yrs)    Types: Cigarettes    Start date: 02/12/1984    Passive exposure: Current   Smokeless tobacco: Former    Quit date: 10/24/2012   Tobacco comments:    1.5-2ppd  Quit attempt 12/24/2018    0.5 ppd, quit March 2025, resumed April 2025    Currently smoking 1 ppd as of August 2025  Vaping Use   Vaping status: Never Used  Substance Use Topics   Alcohol  use: Yes    Comment: 4-5 beers daily   Drug use: Not Currently    Types: Crack cocaine, Cocaine    Comment: 03/02/23     ROS Refer to HPI for ROS details.  Objective:   Vitals: BP 122/83 (BP Location: Right Arm)   Pulse 83   Temp 98.4 F (36.9 C) (Oral)   Resp 20   Ht 5' 8 (1.727 m)   Wt 252 lb (114.3 kg)   SpO2 98%   BMI 38.32 kg/m  Physical Exam Vitals and nursing note reviewed.  Constitutional:      General: He is not in acute distress.    Appearance: Normal appearance. He is well-developed. He is not ill-appearing or toxic-appearing.  HENT:     Head: Normocephalic.     Nose: Congestion and rhinorrhea present.     Mouth/Throat:     Mouth: Mucous membranes are moist.     Pharynx: No posterior oropharyngeal erythema.  Eyes:     Extraocular Movements: Extraocular movements intact.     Conjunctiva/sclera: Conjunctivae normal.  Cardiovascular:     Rate and Rhythm: Normal rate.  Pulmonary:     Effort: Pulmonary effort is normal. No respiratory distress.     Breath sounds: No stridor. No wheezing.  Abdominal:     Palpations: Abdomen is soft.     Tenderness: There is no abdominal tenderness. There is no right CVA tenderness or left CVA tenderness.  Skin:    General: Skin is warm and dry.  Neurological:     General: No focal deficit present.     Mental Status: He is alert and oriented to person, place, and time.  Psychiatric:        Mood and Affect: Mood normal.        Behavior: Behavior normal.     Procedures  Results for orders placed or performed during the hospital encounter of 11/13/23 (from the past 24 hours)  POC Covid19/Flu A&B Antigen     Status: Normal   Collection Time: 11/13/23  4:40 PM  Result Value Ref Range   Influenza A Antigen, POC Negative Negative   Influenza B Antigen, POC Negative Negative   Covid Antigen, POC Negative Negative    No results found.   Assessment and Plan :     Discharge Instructions       1. Acute viral syndrome (Primary) - POC Covid19/Flu A&B Antigen complete in UC is negative for COVID and influenza. - azelastine (ASTELIN)  0.1 % nasal spray; Place 1 spray into both nostrils 2 (two) times daily. Use in each nostril as directed  Dispense: 30 mL; Refill: 1 - promethazine -dextromethorphan (PROMETHAZINE -DM) 6.25-15 MG/5ML syrup; Take 10 mLs by mouth 3 (three) times daily as needed.  Dispense: 240 mL; Refill: 0 -Continue to monitor symptoms for any change in severity if there is any escalation of current symptoms or development of new symptoms follow-up in ER for further evaluation and management.      Ambrie Carte B Bryla Burek   Kapri Nero, [place], [place] 11/13/23 1720

## 2023-11-19 ENCOUNTER — Telehealth (HOSPITAL_COMMUNITY): Payer: Self-pay | Admitting: Cardiology

## 2023-11-19 NOTE — Telephone Encounter (Signed)
 Patient cancelled scheduled Myoview for reason below:  11/19/2023 1:12 PM Ab:AMNNXD, SHARON S  Cancel Rsn: Patient (Has work conflict - will call back to reschedule.)   Order will be removed from the Wq and if patient calls back we will reinstate the order. Thank you.

## 2023-11-20 ENCOUNTER — Ambulatory Visit: Payer: Self-pay | Admitting: Cardiology

## 2023-11-21 ENCOUNTER — Encounter (HOSPITAL_COMMUNITY): Payer: Self-pay

## 2023-11-21 ENCOUNTER — Ambulatory Visit (HOSPITAL_COMMUNITY): Payer: MEDICAID

## 2023-12-09 ENCOUNTER — Encounter: Payer: MEDICAID | Admitting: Physical Medicine and Rehabilitation

## 2023-12-11 ENCOUNTER — Ambulatory Visit: Payer: MEDICAID | Admitting: Orthopaedic Surgery

## 2023-12-15 ENCOUNTER — Encounter: Payer: Self-pay | Admitting: Radiology

## 2023-12-17 ENCOUNTER — Encounter (HOSPITAL_BASED_OUTPATIENT_CLINIC_OR_DEPARTMENT_OTHER): Payer: Self-pay

## 2023-12-17 ENCOUNTER — Ambulatory Visit (INDEPENDENT_AMBULATORY_CARE_PROVIDER_SITE_OTHER): Payer: MEDICAID

## 2023-12-17 VITALS — BP 117/77 | HR 82 | Ht 68.0 in | Wt 248.0 lb

## 2023-12-17 DIAGNOSIS — R911 Solitary pulmonary nodule: Secondary | ICD-10-CM | POA: Diagnosis not present

## 2023-12-17 DIAGNOSIS — I4891 Unspecified atrial fibrillation: Secondary | ICD-10-CM | POA: Diagnosis not present

## 2023-12-17 DIAGNOSIS — F17210 Nicotine dependence, cigarettes, uncomplicated: Secondary | ICD-10-CM

## 2023-12-17 DIAGNOSIS — J45909 Unspecified asthma, uncomplicated: Secondary | ICD-10-CM | POA: Diagnosis not present

## 2023-12-17 DIAGNOSIS — G4733 Obstructive sleep apnea (adult) (pediatric): Secondary | ICD-10-CM

## 2023-12-17 DIAGNOSIS — F17200 Nicotine dependence, unspecified, uncomplicated: Secondary | ICD-10-CM

## 2023-12-17 DIAGNOSIS — R918 Other nonspecific abnormal finding of lung field: Secondary | ICD-10-CM

## 2023-12-17 DIAGNOSIS — J439 Emphysema, unspecified: Secondary | ICD-10-CM

## 2023-12-17 NOTE — Progress Notes (Signed)
 @Patient  ID: Blake Arnold, male    DOB: March 01, 1970, 53 y.o.   MRN: [id number]  Chief Complaint  Patient presents with   Establish Care    New Sleep     Referring provider: Donzetta Rollene BRAVO, MD  HPI: Discussed the use of AI scribe software for clinical note transcription with the patient, who gave verbal consent to proceed.  History of Present Illness Blake Arnold is a 53 year old male with sleep apnea who presents for evaluation of sleep apnea management options. He was referred by his doctor for evaluation of sleep apnea management options, including consideration of an implant.  He has a history of sleep apnea diagnosed around 2016 or 2017 following a hip replacement and diagnosis of rheumatoid arthritis and avascular necrosis. He reports having had two sleep studies at [facility] in [facility] and recalls being told he had severe sleep apnea. He was prescribed a CPAP machine but was unable to tolerate it due to feelings of claustrophobia and discomfort, leading to discontinuation. He currently manages symptoms by sleeping with a fan blowing in his face.  He experiences snoring and occasionally wakes up short of breath, with these episodes becoming more frequent recently. He has significant daytime fatigue, often feeling like he could 'go right back to sleep' upon waking. He sometimes feels drowsy while driving long distances, particularly when sunlight flickers through trees, but ensures to pull over if he feels he might fall asleep. His sleep schedule typically involves going to bed between 9 and 10 PM and waking around 6:30 AM.  His medical history includes rheumatoid arthritis, avascular necrosis, high blood pressure, asthma, high cholesterol, benign positional vertigo, and atrial fibrillation, which was treated with cardioversion in 2008. Alcohol  consumption can exacerbate his atrial fibrillation symptoms, which he refers to as 'holiday heart.'  He is a smoker, currently smoking  half a pack per day, down from two packs, and is taking varenicline  to aid in quitting. He has been smoking since age 49. He uses Anoro as an inhaler and albuterol  as needed, approximately once a month, although he often does not have it on hand when needed.  He is part of a lung nodule screening program due to stable nodules observed over the past two years on CT.  He experiences some shortness of breath with activity, cough, and occasional chest discomfort.  He has experienced weight fluctuations of about 20 pounds, which he attributes to his rheumatoid arthritis affecting his ability to work and stay active.     TEST/EVENTS : ESS of 19 at today's appt  Chest CT 04/28/2023: COMPARISON:  05/09/2021.   FINDINGS: Cardiovascular: Atherosclerotic calcification of the aorta with age advanced involvement of the left anterior descending and right coronary arteries. Heart size normal. No pericardial effusion.   Mediastinum/Nodes: No pathologically enlarged mediastinal or axillary lymph nodes. Hilar regions are difficult to definitively evaluate without IV contrast. Esophagus is grossly unremarkable.   Lungs/Pleura: Centrilobular and paraseptal emphysema. Smoking related respiratory bronchiolitis. Pulmonary nodules measure 6.3 mm or less in size, as before. No new pulmonary nodules. No pleural fluid. Airway is unremarkable.   Upper Abdomen: Visualized portions of the liver, gallbladder, adrenal glands, kidneys, spleen, pancreas, stomach and bowel are grossly unremarkable. No upper abdominal adenopathy.   Musculoskeletal: Degenerative changes in the spine.   IMPRESSION: 1. Lung-RADS 2, benign appearance or behavior. Continue annual screening with low-dose chest CT without contrast in 12 months. 2. Age advanced 2 vessel coronary artery calcification. 3.  Aortic atherosclerosis (ICD10-I70.0). 4.  Emphysema (ICD10-J43.9).    Allergies  Allergen Reactions   Lisinopril  Swelling and Other  (See Comments)    Swelling of the lips    Immunization History  Administered Date(s) Administered   Hepatitis B, ADULT 01/20/2015   Influenza, Seasonal, Injecte, Preservative Fre 10/29/2023   Influenza,inj,Quad PF,6+ Mos 12/02/2016, 01/05/2018, 11/02/2018, 11/01/2020   Influenza-Unspecified 12/11/2015, 12/13/2019   Moderna SARS-COV2 Booster Vaccination 12/22/2019   Moderna Sars-Covid-2 Vaccination 04/26/2019, 05/24/2019   PFIZER Comirnaty(Gray Top)Covid-19 Tri-Sucrose Vaccine 11/01/2020   PNEUMOCOCCAL CONJUGATE-20 07/12/2022   Pfizer(Comirnaty)Fall Seasonal Vaccine 12 years and older 11/25/2022   Pneumococcal Conjugate-13 07/15/2016   Pneumococcal Polysaccharide-23 12/09/2016   Tdap 08/09/2013, 09/27/2018    Past Medical History:  Diagnosis Date   A-fib (HCC)    Abdominal aortic aneurysm (AAA)    34mm, infrarenal in 2024. 40yr follow up surviellence   Acid reflux    Alcohol  abuse    Allergy    seasonal   Anxiety    Asthma    AVN (avascular necrosis of bone) (HCC)    Blood transfusion without reported diagnosis    during surgery   Chronic back pain    Complex tear of medial meniscus of knee 04/21/2023   COPD (chronic obstructive pulmonary disease) (HCC)    Depression    Hepatitis C    history of   History of urinary frequency    Hyperlipidemia    Hypertension    Peripheral neuropathy    hands and feet   Polysubstance abuse (HCC) 02/09/2010   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HCC)    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HCC)    Seizures (HCC)    15 years ago   Sleep apnea     Tobacco History: Social History   Tobacco Use  Smoking Status Every Day   Current packs/day: 0.50   Average packs/day: 1 pack/day for 39.8 years (39.5 ttl pk-yrs)   Types: Cigarettes   Start date: 02/12/1984   Passive exposure: Current  Smokeless Tobacco Former   Quit date: 10/24/2012  Tobacco Comments   1.5-2ppd  Quit attempt 12/24/2018   0.5 ppd, quit March 2025, resumed April 2025   Currently smoking 1  ppd as of August 2025   Ready to quit: Not Answered Counseling given: Not Answered Tobacco comments: 1.5-2ppd  Quit attempt 12/24/2018 0.5 ppd, quit March 2025, resumed April 2025 Currently smoking 1 ppd as of August 2025   Outpatient Medications Prior to Visit  Medication Sig Dispense Refill   albuterol  (VENTOLIN  HFA) 108 (90 Base) MCG/ACT inhaler INHALE 1 TO 2 PUFFS INTO THE LUNGS EVERY 6 HOURS AS NEEDED FOR WHEEZING OR SHORTNESS OF BREATH 18 g 3   aspirin  EC 81 MG tablet Take 1 tablet (81 mg total) by mouth daily. Swallow whole. 30 tablet 12   azelastine (ASTELIN) 0.1 % nasal spray Place 1 spray into both nostrils 2 (two) times daily. Use in each nostril as directed 30 mL 1   diclofenac  Sodium (VOLTAREN ) 1 % GEL Apply 2 g topically 4 (four) times daily. (Patient taking differently: Apply 2 g topically 4 (four) times daily.) 50 g 3   famotidine  (PEPCID ) 20 MG tablet Take 1 tablet (20 mg total) by mouth daily. (Patient taking differently: Take 1 tablet (20 mg total) by mouth daily.) 30 tablet 2   lidocaine  (LIDODERM ) 5 % Place 1 patch onto the skin daily. Remove & Discard patch within 12 hours or as directed by MD 30 patch 0   loperamide  (IMODIUM ) 2 MG  capsule Take 1 capsule (2 mg total) by mouth 2 (two) times daily as needed for diarrhea or loose stools. 14 capsule 0   metoprolol  tartrate (LOPRESSOR ) 25 MG tablet Take 25 mg by mouth 2 (two) times daily.     naltrexone  (DEPADE) 50 MG tablet Take 1 tablet (50 mg total) by mouth daily. 90 tablet 1   olmesartan -hydrochlorothiazide  (BENICAR  HCT) 20-12.5 MG tablet TAKE 1 TABLET BY MOUTH DAILY 90 tablet 1   ondansetron  (ZOFRAN -ODT) 8 MG disintegrating tablet Take 1 tablet (8 mg total) by mouth every 8 (eight) hours as needed for nausea or vomiting. 20 tablet 0   rosuvastatin  (CRESTOR ) 20 MG tablet Take 1 tablet (20 mg total) by mouth daily. 90 tablet 3   sucralfate  (CARAFATE ) 1 g tablet Take 1 g by mouth as directed.     umeclidinium-vilanterol  (ANORO ELLIPTA ) 62.5-25 MCG/ACT AEPB Inhale 1 puff into the lungs daily. 60 each 1   varenicline  (CHANTIX ) 1 MG tablet Take 1 tablet (1 mg total) by mouth 2 (two) times daily. 60 tablet 2   cetirizine  (ZYRTEC ) 10 MG tablet Take 1 tablet (10 mg total) by mouth daily. (Patient taking differently: Take 1 tablet (10 mg total) by mouth daily.) 30 tablet 11   pantoprazole  (PROTONIX ) 20 MG tablet Take 20 mg by mouth daily.     promethazine -dextromethorphan (PROMETHAZINE -DM) 6.25-15 MG/5ML syrup Take 10 mLs by mouth 3 (three) times daily as needed. 240 mL 0   tiZANidine  (ZANAFLEX ) 4 MG tablet Take 4 mg by mouth every 6 (six) hours as needed for muscle spasms.     No facility-administered medications prior to visit.     Review of Systems: as per HPI  Constitutional:   No  weight loss, night sweats,  Fevers, chills, fatigue, or  lassitude.  HEENT:   No headaches,  Difficulty swallowing,  Tooth/dental problems, or  Sore throat,                No sneezing, itching, ear ache, nasal congestion, post nasal drip,   CV:  No chest pain,  Orthopnea, PND, swelling in lower extremities, anasarca, dizziness, palpitations, syncope.   GI  No heartburn, indigestion, abdominal pain, nausea, vomiting, diarrhea, change in bowel habits, loss of appetite, bloody stools.   Resp: No shortness of breath with exertion or at rest.  No excess mucus, no productive cough,  No non-productive cough,  No coughing up of blood.  No change in color of mucus.  No wheezing.  No chest wall deformity  Skin: no rash or lesions.  GU: no dysuria, change in color of urine, no urgency or frequency.  No flank pain, no hematuria   MS:  No joint pain or swelling.  No decreased range of motion.  No back pain.    Physical Exam  BP 117/77 (BP Location: Left Arm)   Pulse 82   Ht 5' 8 (1.727 m)   Wt 248 lb (112.5 kg)   SpO2 94%   BMI 37.71 kg/m   GEN: A/Ox3; pleasant , NAD, well nourished    HEENT:  [place]/AT,  EACs-clear, TMs-wnl,  NOSE-clear, THROAT-clear, no lesions, no postnasal drip or exudate noted. Mallampati 4  NECK:  Supple w/ fair ROM; no JVD; normal carotid impulses w/o bruits; no thyromegaly or nodules palpated; no lymphadenopathy.    RESP  Clear  P & A; w/o, wheezes/ rales/ or rhonchi. no accessory muscle use, no dullness to percussion  CARD:  RRR, no m/r/g, no peripheral edema, pulses  intact, no cyanosis or clubbing.  GI:   obese, soft & nt; nml bowel sounds; no organomegaly or masses detected.   Musco: Warm bil, no deformities or joint swelling noted.   Neuro: alert, no focal deficits noted.    Skin: Warm, no lesions or rashes    Lab Results:  CBC    Component Value Date/Time   WBC 7.9 08/24/2023 0953   RBC 5.30 08/24/2023 0953   HGB 17.5 (H) 08/24/2023 0953   HGB 17.3 04/21/2023 1542   HCT 51.5 08/24/2023 0953   HCT 47.3 04/21/2023 1542   PLT 182 08/24/2023 0953   PLT 211 04/21/2023 1542   MCV 97.2 08/24/2023 0953   MCV 97 04/21/2023 1542   MCH 33.0 08/24/2023 0953   MCHC 34.0 08/24/2023 0953   RDW 13.2 08/24/2023 0953   RDW 12.6 04/21/2023 1542   LYMPHSABS 2.4 04/21/2023 1542   MONOABS 0.7 05/02/2022 1054   EOSABS 0.2 04/21/2023 1542   BASOSABS 0.1 04/21/2023 1542    BMET    Component Value Date/Time   NA 132 (L) 08/24/2023 0953   NA 138 04/21/2023 1542   K 3.7 08/24/2023 0953   CL 104 08/24/2023 0953   CO2 19 (L) 08/24/2023 0953   GLUCOSE 122 (H) 08/24/2023 0953   BUN 11 08/24/2023 0953   BUN 8 04/21/2023 1542   CREATININE 1.06 08/24/2023 0953   CALCIUM  9.1 08/24/2023 0953   GFRNONAA >60 08/24/2023 0953   GFRAA 101 09/08/2019 1707    BNP    Component Value Date/Time   BNP 20.7 09/04/2022 1151    ProBNP No results found for: PROBNP  Imaging: No results found.  Administration History     None           No data to display          No results found for: NITRICOXIDE   Assessment & Plan:   Assessment & Plan OSA (obstructive sleep  apnea)  Chronic obstructive pulmonary disease with emphysema, unspecified emphysema type (HCC)  TOBACCO ABUSE  Lung nodules  Assessment and Plan Assessment & Plan Obstructive sleep apnea Severe obstructive sleep apnea with CPAP intolerance due to claustrophobia. Potential contribution to atrial fibrillation and cardiovascular issues. - Ordered repeat sleep study to assess current severity. - Discussed CPAP and Inspire device options, contingent on sleep study results. - Provided information on sleep hygiene.  Pulmonary nodule, stable Pulmonary nodules stable over two years. - Continue with scheduled follow-up CT scan.  Asthma Well-controlled with infrequent albuterol  use. - Continue current management with Anoro inhaler and albuterol  as needed.  Nicotine  dependence, cigarettes Long-standing nicotine  dependence, currently smoking half a pack per day. Using varenicline  for cessation. - Continue varenicline  for smoking cessation.  Atrial fibrillation Managed with previous cardioversion. Occasional exacerbations related to alcohol . - Advised on limiting alcohol  intake.    Return in about 2 months (around 02/16/2024) for sleep study review.  Candis Dandy, PA-C 12/17/2023

## 2023-12-17 NOTE — Patient Instructions (Addendum)
 Complete home sleep test as ordered.  Follow up in 8-10 weeks.  Follow sleep hygiene as discussed.

## 2023-12-17 NOTE — Progress Notes (Signed)
 Epworth Sleepiness Scale  Use the following scale to choose the most appropriate number for each situation. 0 Would never nod off 1  Slight  chance of nodding off 2 Moderate chance of nodding off 3 High chance of nodding off  Sitting and reading: 3 Watching TV: 3 Sitting, inactive, in a public place (e.g., in a meeting, theater, or dinner event): 3 As a passenger in a car for an hour or more without stopping for a break: 3 Lying down to rest when circumstances permit:3 Sitting and talking to someone: 1 Sitting quietly after a meal without alcohol: 3 In a car, while stopped for a few  minutes in traffic or at a light: 0  TOTOAL: 19

## 2023-12-22 ENCOUNTER — Encounter: Payer: Self-pay | Admitting: Family Medicine

## 2023-12-25 ENCOUNTER — Ambulatory Visit
Admission: RE | Admit: 2023-12-25 | Discharge: 2023-12-25 | Disposition: A | Discharge status: Home / Self Care | Payer: MEDICAID | Source: Ambulatory Visit | Attending: Family Medicine | Admitting: Family Medicine

## 2023-12-25 DIAGNOSIS — R918 Other nonspecific abnormal finding of lung field: Secondary | ICD-10-CM

## 2023-12-30 ENCOUNTER — Ambulatory Visit: Payer: Self-pay | Admitting: Family Medicine

## 2023-12-30 DIAGNOSIS — R911 Solitary pulmonary nodule: Secondary | ICD-10-CM

## 2023-12-31 ENCOUNTER — Ambulatory Visit: Payer: MEDICAID | Admitting: Family Medicine

## 2023-12-31 NOTE — Progress Notes (Deleted)
    SUBJECTIVE:   CHIEF COMPLAINT / HPI:   ***  PERTINENT  PMH / PSH: CAD, HTN, HLD, HTN, GAD, alcohol  use disorder, tobacco use, OSA   OBJECTIVE:   There were no vitals taken for this visit. ***  General: NAD, pleasant, able to participate in exam Cardiac: RRR, no murmurs. Respiratory: CTAB, normal effort, No wheezes, rales or rhonchi Abdomen: Bowel sounds present, nontender, nondistended Extremities: no edema or cyanosis. Skin: warm and dry, no rashes noted Neuro: alert, no obvious focal deficits Psych: Normal affect and mood  ASSESSMENT/PLAN:   No problem-specific Assessment & Plan notes found for this encounter.     Dr. Izetta Nap, DO  [facility]    {    This will disappear when note is signed, click to select method of visit    :1}

## 2024-01-22 ENCOUNTER — Encounter: Payer: Self-pay | Admitting: Physical Medicine and Rehabilitation

## 2024-01-22 ENCOUNTER — Encounter: Payer: MEDICAID | Attending: Physical Medicine and Rehabilitation | Admitting: Physical Medicine and Rehabilitation

## 2024-01-22 VITALS — BP 146/81 | HR 79 | Ht 68.0 in | Wt 249.8 lb

## 2024-01-22 DIAGNOSIS — M4802 Spinal stenosis, cervical region: Secondary | ICD-10-CM | POA: Diagnosis not present

## 2024-01-22 DIAGNOSIS — G4701 Insomnia due to medical condition: Secondary | ICD-10-CM

## 2024-01-22 DIAGNOSIS — M25552 Pain in left hip: Secondary | ICD-10-CM

## 2024-01-22 MED ORDER — TRAZODONE HCL 50 MG PO TABS: 50.0000 mg | ORAL_TABLET | Freq: Every day | ORAL | 3 refills | Status: AC
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
## 2024-02-15 NOTE — Progress Notes (Signed)
 "  Office Visit Note  Patient: Blake Arnold             Date of Birth: March 04, 1970           MRN: [id number]             PCP: Theophilus Pagan, MD Referring: Donah Laymon PARAS, MD Visit Date: 02/19/2024 Occupation: Data Unavailable  Subjective:  New Patient (Initial Visit) (Treatment for RA)   Discussed the use of AI scribe software for clinical note transcription with the patient, who gave verbal consent to proceed.  History of Present Illness   Blake Arnold is a 54 year old male with rheumatoid arthritis who presents for evaluation and management.  He is experiencing a flare-up of rheumatoid arthritis symptoms after discontinuing his medications. He reports significant pain in his knees and hands, particularly in his middle fingers, though he states the pain is less severe today. He also experiences tingling in the last two fingers of his left hand, a symptom present since before his last neck surgery two years ago.  He has previously been on methotrexate and Humira  for his rheumatoid arthritis. Methotrexate was discontinued due to side effects, including feeling unwell and an incident where he hurt his hand with the needle. He is unsure why Humira  was discontinued. He has not been on any medication during the recent flare-up and has been 'waiting it out'.  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 He describes the pain as severe during the flare-up and reports that he is able to get up more easily when the flare-up resolves.  He has not taken any steroids recently for his rheumatoid arthritis but has used them in the past for joint pain. He is concerned about the impact of steroids on his left hip, which he is trying to preserve with history of previous right hip replacement for AVN.  He has a history of neck surgery and  has had elbow and wrist surgeries on both hands following hip surgery, though he cannot recall the exact timing. He has been advised to have x-rays of his knee and hip but has not yet completed them due to a busy work schedule.  He drinks alcohol  and smokes, and is currently on naltrexone  for alcohol  use and Chantix  to help reduce smoking. He has reduced his alcohol  intake from whiskey to beer and is working on quitting smoking.  No recent use of antibiotics or recent illnesses, though he mentions a recent viral illness with chest symptoms and phlegm, which he attributes to weather changes. No recent use of prednisone  or other steroids for his rheumatoid arthritis.       Activities of Daily Living:  Patient reports morning stiffness for 30-40 minutes.   Patient Reports nocturnal pain.  Difficulty dressing/grooming: Reports Difficulty climbing stairs: Reports Difficulty getting out of chair: Reports Difficulty using hands for taps, buttons, cutlery, and/or writing: Reports  Review of Systems  Constitutional:  Positive for fatigue.  HENT:  Positive for mouth dryness. Negative for mouth sores.   Eyes:  Negative for dryness.  Respiratory:  Positive for shortness of breath.   Cardiovascular:  Positive for chest pain. Negative for palpitations.  Gastrointestinal:  Negative for blood in stool, constipation and diarrhea.  Endocrine: Negative for increased urination.  Genitourinary:  Negative for involuntary urination.  Musculoskeletal:  Positive for joint pain, joint pain, joint swelling, myalgias, muscle weakness, morning stiffness, muscle tenderness and myalgias. Negative for gait problem.  Skin:  Positive for sensitivity to sunlight. Negative for color change, rash and hair loss.  Allergic/Immunologic: Positive for susceptible to infections.  Neurological:  Positive for dizziness. Negative for headaches.  Hematological:  Negative for swollen glands.  Psychiatric/Behavioral:  Positive for  sleep disturbance. Negative for depressed mood. The patient is not nervous/anxious.     PMFS History:  Patient Active Problem List   Diagnosis Date Noted   High risk medication use 02/19/2024   COPD (chronic obstructive pulmonary disease) (HCC) 09/23/2023   Coronary artery calcification 06/13/2023   Benign paroxysmal positional vertigo of right ear 05/26/2023   Complex tear of medial meniscus of knee 04/21/2023   Osteoarthritis of left knee 04/21/2023   Acute bacterial rhinosinusitis 11/25/2022   Abdominal aortic aneurysm (AAA) 30 to 34 mm in diameter 09/12/2022   GERD (gastroesophageal reflux disease) 09/12/2022   AVN (avascular necrosis of bone) (HCC) 08/02/2021   History of hepatitis C 04/12/2021   Tooth abscess 01/14/2020   Tongue lesion 10/11/2019   Carpal tunnel syndrome 08/24/2019   Onychomycosis 08/24/2019   Cervicalgia 06/02/2019   Abdominal pain 02/28/2019   Hyperlipidemia 09/01/2018   HTN (hypertension) 07/24/2017   Seizure (HCC) 07/24/2017   RA (rheumatoid arthritis) (HCC) 07/24/2017   Degenerative joint disease 12/28/2015   OSA (obstructive sleep apnea) 11/23/2015   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 42yr follow up surviellence   Acid reflux    Alcohol  abuse    Allergy    seasonal   Anxiety    Asthma    AVN (avascular necrosis of bone) (HCC)    Blood transfusion without reported diagnosis    during surgery   Chronic back pain    Complex tear of medial meniscus of knee 04/21/2023   COPD (chronic obstructive pulmonary disease) (HCC)     Depression    Hepatitis C    history of   History of urinary frequency    Hyperlipidemia    Hypertension    Peripheral neuropathy    hands and feet   Polysubstance abuse (HCC) 02/09/2010   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HCC)    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HCC)    Seizures (HCC)    15 years ago   Sleep apnea     Family History  Problem Relation Age of Onset   Heart attack Mother    Atrial fibrillation Mother    Lung cancer Mother    Skin cancer Father    Healthy Sister    Healthy Sister    Healthy Brother    Colon polyps Neg Hx    Esophageal cancer Neg Hx    Stomach cancer Neg Hx    Rectal cancer Neg Hx    Colon cancer Neg Hx    Crohn's disease Neg Hx    Ulcerative colitis Neg Hx    Past Surgical History:  Procedure Laterality Date   APPENDECTOMY     CARDIOVERSION  03/07/2006   CARPAL TUNNEL RELEASE  CERVICAL FUSION     LAPAROSCOPIC APPENDECTOMY N/A 07/23/2017   Procedure: APPENDECTOMY LAPAROSCOPIC;  Surgeon: Sebastian Moles, MD;  Location: [facility] OR;  Service: General;  Laterality: N/A;   TOTAL HIP ARTHROPLASTY Right 2016   Social History[1] Social History   Social History Narrative   Not on file     Immunization History  Administered Date(s) Administered   Hepatitis B, ADULT 01/20/2015   Influenza, Seasonal, Injecte, Preservative Fre 10/29/2023   Influenza,inj,Quad PF,6+ Mos 12/02/2016, 01/05/2018, 11/02/2018, 11/01/2020   Influenza-Unspecified 12/11/2015, 12/13/2019   Moderna SARS-COV2 Booster Vaccination 12/22/2019   Moderna Sars-Covid-2 Vaccination 04/26/2019, 05/24/2019   PFIZER Comirnaty(Gray Top)Covid-19 Tri-Sucrose Vaccine 11/01/2020   PNEUMOCOCCAL CONJUGATE-20 07/12/2022   Pfizer(Comirnaty)Fall Seasonal Vaccine 12 years and older 11/25/2022   Pneumococcal Conjugate-13 07/15/2016   Pneumococcal Polysaccharide-23 12/09/2016   Tdap 08/09/2013, 09/27/2018     Objective: Vital Signs: BP 139/73 (BP Location: Right Arm, Patient Position: Sitting, Cuff Size: Normal)    Pulse 91   Temp 98.7 F (37.1 C)   Resp 16   Ht 5' 8 (1.727 m)   Wt 248 lb (112.5 kg)   BMI 37.71 kg/m    Physical Exam Eyes:     Conjunctiva/sclera: Conjunctivae normal.  Cardiovascular:     Rate and Rhythm: Normal rate and regular rhythm.  Pulmonary:     Effort: Pulmonary effort is normal.     Breath sounds: Normal breath sounds.  Lymphadenopathy:     Cervical: No cervical adenopathy.  Skin:    General: Skin is warm and dry.     Findings: Rash present.     Comments: On forearms  Neurological:     Mental Status: He is alert.  Psychiatric:        Mood and Affect: Mood normal.      Musculoskeletal Exam:  Shoulders full ROM no tenderness or swelling Elbows full ROM no tenderness or swelling Wrists full ROM no tenderness or swelling Fingers full ROM, 3rd digit with tenderness to pressure, swelling Knees full ROM, tenderness to pressure,no effusion Ankles full ROM no tenderness or swelling    Investigation: No additional findings.  Imaging: No results found.  Recent Labs: Lab Results  Component Value Date   WBC 10.5 02/19/2024   HGB 16.7 02/19/2024   PLT 261 02/19/2024   NA 136 02/19/2024   K 3.9 02/19/2024   CL 100 02/19/2024   CO2 25 02/19/2024   GLUCOSE 99 02/19/2024   BUN 14 02/19/2024   CREATININE 0.72 02/19/2024   BILITOT 0.7 02/19/2024   ALKPHOS 51 08/24/2023   AST 42 (H) 02/19/2024   ALT 66 (H) 02/19/2024   PROT 7.6 02/19/2024   ALBUMIN 3.9 08/24/2023   CALCIUM  9.4 02/19/2024   GFRAA 101 09/08/2019   QFTBGOLDPLUS NEGATIVE 02/19/2024    Speciality Comments: No specialty comments available.  Procedures:  No procedures performed Allergies: Lisinopril    Assessment / Plan:     Visit Diagnoses:  Assessment & Plan Rheumatoid arthritis involving both hands, unspecified whether rheumatoid factor present (HCC) Avascular necrosis (HCC) Chronic pain of both knees Experiencing flare-up with significant pain in knees and hands. Previous  methotrexate and Humira  discontinued due to side effects and perceived improvement. Current flare-up requires restarting treatment. Humira  preferred for potency and effectiveness and do not expect monotherapy methotrexate to be adequate. Smoking and alcohol  may exacerbate disease activity. - Restart Humira  40 mg [place] q14days. - Order blood tests: sedimentation rate, C-reactive protein, WBC count, kidney and liver function, TB screening. - Order  x-rays of knees and hips. - Discuss potential methotrexate addition if Humira  insufficient. - Advise smoking cessation. - Discuss alcohol  use and methotrexate interaction.   Orders:   Sedimentation rate   C-reactive protein   Rheumatoid factor   Cyclic citrul peptide antibody, IgG   XR KNEE 3 VIEW RIGHT   XR KNEE 3 VIEW LEFT   XR HIPS BILAT W OR W/O PELVIS 3-4 VIEWS  High risk medication use No serious recent infections. Had tolerated previous DAMRD regimen without complications. Reviewed risks of medication including injcetion reactions, infections, malignancy. Patient with known cardiac history but not significant CHF so no contraindication. - Checking CBC and CMP and quantiferon for medication monitoring with resuming adalimumab  - Checking quantiferon  Orders:   CBC with Differential/Platelet   Comprehensive metabolic panel with GFR   QuantiFERON-TB Gold Plus     Follow-Up Instructions: Return in about 2 months (around 04/18/2024) for New pt RA/OA/AVN ADA restart f/u 2mos.   Lonni LELON Ester, MD  Note - This record has been created using [employer].  Chart creation errors have been sought, but may not always  have been located. Such creation errors do not reflect on  the standard of medical care.      [1]  Social History Tobacco Use   Smoking status: Every Day    Current packs/day: 0.50    Average packs/day: 1 pack/day for 40.1 years (39.7 ttl pk-yrs)    Types: Cigarettes    Start date: 02/12/1984    Passive exposure:  Past   Smokeless tobacco: Former    Types: Chew    Quit date: 10/24/2012   Tobacco comments:    1.5-2ppd  Quit attempt 12/24/2018    0.5 ppd, quit March 2025, resumed April 2025    Currently smoking 1 ppd as of August 2025  Vaping Use   Vaping status: Never Used  Substance Use Topics   Alcohol  use: Yes    Comment: 4-5 beers daily   Drug use: Not Currently    Types: Crack cocaine, Cocaine    Comment: 03/02/23   "

## 2024-02-17 ENCOUNTER — Ambulatory Visit (HOSPITAL_BASED_OUTPATIENT_CLINIC_OR_DEPARTMENT_OTHER): Payer: MEDICAID

## 2024-02-19 ENCOUNTER — Ambulatory Visit: Payer: MEDICAID

## 2024-02-19 ENCOUNTER — Ambulatory Visit: Payer: MEDICAID | Attending: Internal Medicine | Admitting: Internal Medicine

## 2024-02-19 ENCOUNTER — Encounter: Payer: Self-pay | Admitting: Internal Medicine

## 2024-02-19 VITALS — BP 139/73 | HR 91 | Temp 98.7°F | Resp 16 | Ht 68.0 in | Wt 248.0 lb

## 2024-02-19 DIAGNOSIS — M8700 Idiopathic aseptic necrosis of unspecified bone: Secondary | ICD-10-CM | POA: Insufficient documentation

## 2024-02-19 DIAGNOSIS — M25561 Pain in right knee: Secondary | ICD-10-CM | POA: Diagnosis present

## 2024-02-19 DIAGNOSIS — M069 Rheumatoid arthritis, unspecified: Secondary | ICD-10-CM | POA: Diagnosis present

## 2024-02-19 DIAGNOSIS — Z79899 Other long term (current) drug therapy: Secondary | ICD-10-CM | POA: Diagnosis present

## 2024-02-19 DIAGNOSIS — G8929 Other chronic pain: Secondary | ICD-10-CM | POA: Insufficient documentation

## 2024-02-19 DIAGNOSIS — M25562 Pain in left knee: Secondary | ICD-10-CM | POA: Diagnosis present

## 2024-02-19 NOTE — Patient Instructions (Signed)
       Humira instructions for use (from left to right):    Call Humira Complete if additional injection education is needed: [phone]  Safety: HOLD the dose if you have signs or symptoms of an infection. You can resume once you feel better or back to your baseline. HOLD the dose if you start antibiotics to treat an infection. HOLD the dose for invasive surgeries/procedures. Your surgeon will be able to provide recommendations on when to hold BEFORE and when you are cleared to RESUME.  Injection tips: Store your medication in the fridge (where your milk goes). Allow your medication to reach to room temperature by placing on counter for at least 30 minutes before you inject. Clean the injection area before you inject with an alcohol swab or rubbing alcohol You can SELF-inject in the upper thigh or lower abdomen. Only a caregiver should administer in the back of your arm. Avoid scars, bruises, tender or swollen areas. Alternate injection sites each time. Discard your used medication in a sharps box or a laundry detergent/bleach bottle.  How to manage an injection site reaction: Remember the 5 C's: COUNTER - leave on the counter at least 30 minutes to bring medication to room temperature. This may help prevent stinging COLD - place something cold (like an ice gel pack or cold water bottle) on the injection site just before cleansing with alcohol. This may help reduce pain CLARITIN - use Claritin (generic name is loratadine) for the first two weeks of treatment or the day of, the day before, and the day after injecting. This will help to minimize injection site reactions CORTISONE CREAM - apply if injection site is irritated and itching CALL OFFICE - if injection site reaction is bigger than the size of your fist, looks infected, blisters, or if you develop hives

## 2024-02-19 NOTE — Assessment & Plan Note (Addendum)
 Experiencing flare-up with significant pain in knees and hands. Previous methotrexate and Humira  discontinued due to side effects and perceived improvement. Current flare-up requires restarting treatment. Humira  preferred for potency and effectiveness and do not expect monotherapy methotrexate to be adequate. Smoking and alcohol  may exacerbate disease activity. - Restart Humira  40 mg [place] q14days. - Order blood tests: sedimentation rate, C-reactive protein, WBC count, kidney and liver function, TB screening. - Order x-rays of knees and hips. - Discuss potential methotrexate addition if Humira  insufficient. - Advise smoking cessation. - Discuss alcohol  use and methotrexate interaction.   Orders:   Sedimentation rate   C-reactive protein   Rheumatoid factor   Cyclic citrul peptide antibody, IgG   XR KNEE 3 VIEW RIGHT   XR KNEE 3 VIEW LEFT   XR HIPS BILAT W OR W/O PELVIS 3-4 VIEWS

## 2024-02-19 NOTE — Assessment & Plan Note (Addendum)
 No serious recent infections. Had tolerated previous DAMRD regimen without complications. Reviewed risks of medication including injcetion reactions, infections, malignancy. Patient with known cardiac history but not significant CHF so no contraindication. - Checking CBC and CMP and quantiferon for medication monitoring with resuming adalimumab  - Checking quantiferon  Orders:   CBC with Differential/Platelet   Comprehensive metabolic panel with GFR   QuantiFERON-TB Gold Plus

## 2024-02-23 LAB — COMPREHENSIVE METABOLIC PANEL WITH GFR
AG Ratio: 1.4 (calc) (ref 1.0–2.5)
ALT: 66 U/L — ABNORMAL HIGH (ref 9–46)
AST: 42 U/L — ABNORMAL HIGH (ref 10–35)
Albumin: 4.4 g/dL (ref 3.6–5.1)
Alkaline phosphatase (APISO): 64 U/L (ref 35–144)
BUN: 14 mg/dL (ref 7–25)
CO2: 25 mmol/L (ref 20–32)
Calcium: 9.4 mg/dL (ref 8.6–10.3)
Chloride: 100 mmol/L (ref 98–110)
Creat: 0.72 mg/dL (ref 0.70–1.30)
Globulin: 3.2 g/dL (ref 1.9–3.7)
Glucose, Bld: 99 mg/dL (ref 65–99)
Potassium: 3.9 mmol/L (ref 3.5–5.3)
Sodium: 136 mmol/L (ref 135–146)
Total Bilirubin: 0.7 mg/dL (ref 0.2–1.2)
Total Protein: 7.6 g/dL (ref 6.1–8.1)
eGFR: 109 mL/min/1.73m2

## 2024-02-23 LAB — QUANTIFERON-TB GOLD PLUS
Mitogen-NIL: >10 [IU]/mL
NIL: 0.02 [IU]/mL
QuantiFERON-TB Gold Plus: NEGATIVE
TB1-NIL: 0 [IU]/mL
TB2-NIL: 0 [IU]/mL

## 2024-02-23 LAB — C-REACTIVE PROTEIN: CRP: <3 mg/L

## 2024-02-23 LAB — CBC WITH DIFFERENTIAL/PLATELET
Absolute Lymphocytes: 2709 {cells}/uL (ref 850–3900)
Absolute Monocytes: 777 {cells}/uL (ref 200–950)
Basophils Absolute: 84 {cells}/uL (ref 0–200)
Basophils Relative: 0.8 %
Eosinophils Absolute: 221 {cells}/uL (ref 15–500)
Eosinophils Relative: 2.1 %
HCT: 47.1 % (ref 39.4–51.1)
Hemoglobin: 16.7 g/dL (ref 13.2–17.1)
MCH: 33.9 pg — ABNORMAL HIGH (ref 27.0–33.0)
MCHC: 35.5 g/dL — ABNORMAL HIGH (ref 31.6–35.4)
MCV: 95.5 fL (ref 81.4–101.7)
MPV: 9.9 fL (ref 7.5–12.5)
Monocytes Relative: 7.4 %
Neutro Abs: 6710 {cells}/uL (ref 1500–7800)
Neutrophils Relative %: 63.9 %
Platelets: 261 Thousand/uL (ref 140–400)
RBC: 4.93 Million/uL (ref 4.20–5.80)
RDW: 12.3 % (ref 11.0–15.0)
Total Lymphocyte: 25.8 %
WBC: 10.5 Thousand/uL (ref 3.8–10.8)

## 2024-02-23 LAB — CYCLIC CITRUL PEPTIDE ANTIBODY, IGG: Cyclic Citrullin Peptide Ab: <16 U

## 2024-02-23 LAB — RHEUMATOID FACTOR: Rheumatoid fact SerPl-aCnc: <10 [IU]/mL

## 2024-02-23 LAB — SEDIMENTATION RATE: Sed Rate: 6 mm/h (ref 0–20)

## 2024-02-24 ENCOUNTER — Encounter (HOSPITAL_BASED_OUTPATIENT_CLINIC_OR_DEPARTMENT_OTHER): Payer: Self-pay

## 2024-02-24 ENCOUNTER — Ambulatory Visit (INDEPENDENT_AMBULATORY_CARE_PROVIDER_SITE_OTHER): Payer: MEDICAID

## 2024-02-24 ENCOUNTER — Encounter (HOSPITAL_BASED_OUTPATIENT_CLINIC_OR_DEPARTMENT_OTHER): Payer: MEDICAID

## 2024-02-24 ENCOUNTER — Encounter (HOSPITAL_BASED_OUTPATIENT_CLINIC_OR_DEPARTMENT_OTHER): Payer: Self-pay | Admitting: *Deleted

## 2024-02-24 VITALS — BP 127/86 | HR 92 | Ht 68.0 in | Wt 250.0 lb

## 2024-02-24 DIAGNOSIS — F17210 Nicotine dependence, cigarettes, uncomplicated: Secondary | ICD-10-CM

## 2024-02-24 DIAGNOSIS — F17200 Nicotine dependence, unspecified, uncomplicated: Secondary | ICD-10-CM

## 2024-02-24 DIAGNOSIS — G4733 Obstructive sleep apnea (adult) (pediatric): Secondary | ICD-10-CM

## 2024-02-24 DIAGNOSIS — R918 Other nonspecific abnormal finding of lung field: Secondary | ICD-10-CM

## 2024-02-24 DIAGNOSIS — J439 Emphysema, unspecified: Secondary | ICD-10-CM | POA: Diagnosis not present

## 2024-02-24 DIAGNOSIS — R0600 Dyspnea, unspecified: Secondary | ICD-10-CM

## 2024-02-24 MED ORDER — ALBUTEROL SULFATE HFA 108 (90 BASE) MCG/ACT IN AERS: 2.0000 | INHALATION_SPRAY | RESPIRATORY_TRACT | 3 refills | Status: AC | PRN

## 2024-02-24 MED ORDER — UMECLIDINIUM-VILANTEROL 62.5-25 MCG/ACT IN AEPB: 1.0000 | INHALATION_SPRAY | Freq: Every day | RESPIRATORY_TRACT | 1 refills | Status: AC

## 2024-02-24 NOTE — Patient Instructions (Addendum)
 Order placed for home sleep test; pending results will send in order for new CPAP.  Rx renewed for Anoro and albuterol .   Follow up in 3 months to review sleep test and potentially compliance download.  Follow up Chest CT already ordered for May 2026.

## 2024-02-24 NOTE — Progress Notes (Signed)
 "  @Patient  ID: Blake Arnold, male    DOB: [birth year], 54 y.o.   MRN: [id number]  Chief Complaint  Patient presents with   Sleep Apnea    Referring provider: Theophilus Pagan, MD  HPI: Discussed the use of AI scribe software for clinical note transcription with the patient, who gave verbal consent to proceed.  History of Present Illness Blake Arnold is a 54 year old male with severe sleep apnea who presents for follow-up regarding CPAP therapy and sleep study.  He has a history of severe sleep apnea, with the last sleep test conducted several years ago. He experiences difficulty tolerating the CPAP machine, often waking up and removing it. He was expecting a new CPAP machine to be delivered to his home, but this has not occurred.    He uses Anoro as an inhaler and requires a refill, as he has only a few puffs left. He also uses albuterol  as a rescue inhaler, particularly at work when he experiences shortness of breath from physical activity such as lifting and climbing stairs. He keeps one inhaler at work and another at home.  He has a history of smoking and is attempting to quit with the aid of Chantix , although he sometimes forgets to take it. He has reduced his alcohol  intake, having quit whiskey about six to seven months ago, which he notes has improved his blood pressure. He still consumes beer occasionally.  A CT scan of his chest in November revealed multiple small nodules, including a new 4 mm nodule in the right middle lobe. A follow-up scan is scheduled for May.  Last OV 12/17/2023: Blake Arnold is a 54 year old male with sleep apnea who presents for evaluation of sleep apnea management options. He was referred by his doctor for evaluation of sleep apnea management options, including consideration of an implant.   He has a history of sleep apnea diagnosed around 2016 or 2017 following a hip replacement and diagnosis of rheumatoid arthritis and avascular necrosis. He  reports having had two sleep studies at [facility] in [facility] and recalls being told he had severe sleep apnea. He was prescribed a CPAP machine but was unable to tolerate it due to feelings of claustrophobia and discomfort, leading to discontinuation. He currently manages symptoms by sleeping with a fan blowing in his face.   He experiences snoring and occasionally wakes up short of breath, with these episodes becoming more frequent recently. He has significant daytime fatigue, often feeling like he could 'go right back to sleep' upon waking. He sometimes feels drowsy while driving long distances, particularly when sunlight flickers through trees, but ensures to pull over if he feels he might fall asleep. His sleep schedule typically involves going to bed between 9 and 10 PM and waking around 6:30 AM.   His medical history includes rheumatoid arthritis, avascular necrosis, high blood pressure, asthma, high cholesterol, benign positional vertigo, and atrial fibrillation, which was treated with cardioversion in 2008. Alcohol  consumption can exacerbate his atrial fibrillation symptoms, which he refers to as 'holiday heart.'   He is a smoker, currently smoking half a pack per day, down from two packs, and is taking varenicline  to aid in quitting. He has been smoking since age 52. He uses Anoro as an inhaler and albuterol  as needed, approximately once a month, although he often does not have it on hand when needed.   He is part of a lung nodule screening program due to stable nodules observed  over the past two years on CT.  He experiences some shortness of breath with activity, cough, and occasional chest discomfort.   He has experienced weight fluctuations of about 20 pounds, which he attributes to his rheumatoid arthritis affecting his ability to work and stay active.         TEST/EVENTS : ESS of 19 on 12/17/2023   Chest CT 04/28/2023: COMPARISON:  05/09/2021.   FINDINGS: Cardiovascular:  Atherosclerotic calcification of the aorta with age advanced involvement of the left anterior descending and right coronary arteries. Heart size normal. No pericardial effusion.   Mediastinum/Nodes: No pathologically enlarged mediastinal or axillary lymph nodes. Hilar regions are difficult to definitively evaluate without IV contrast. Esophagus is grossly unremarkable.   Lungs/Pleura: Centrilobular and paraseptal emphysema. Smoking related respiratory bronchiolitis. Pulmonary nodules measure 6.3 mm or less in size, as before. No new pulmonary nodules. No pleural fluid. Airway is unremarkable.   Upper Abdomen: Visualized portions of the liver, gallbladder, adrenal glands, kidneys, spleen, pancreas, stomach and bowel are grossly unremarkable. No upper abdominal adenopathy.   Musculoskeletal: Degenerative changes in the spine.   IMPRESSION: 1. Lung-RADS 2, benign appearance or behavior. Continue annual screening with low-dose chest CT without contrast in 12 months. 2. Age advanced 2 vessel coronary artery calcification. 3.  Aortic atherosclerosis (ICD10-I70.0). 4.  Emphysema (ICD10-J43.9).  Chest CT 12/25/2023: IMPRESSION: 1. New 4 mm solid right lower lobe pulmonary nodule; per ACR lung-rads guidelines, this is probably benign, however, follow-up imaging is recommended in 6 months to document continued stability. Additional bilateral scattered pulmonary nodules are stable and safely considered benign. 2. Moderate pulmonary emphysema; follow-up imaging may be performed utilizing low-dose lung cancer screening protocol imaging. 3. Improved bronchial wall thickening/airway inflammation Allergies[1]  Immunization History  Administered Date(s) Administered   Hepatitis B, ADULT 01/20/2015   Influenza, Seasonal, Injecte, Preservative Fre 10/29/2023   Influenza,inj,Quad PF,6+ Mos 12/02/2016, 01/05/2018, 11/02/2018, 11/01/2020   Influenza-Unspecified 12/11/2015, 12/13/2019   Moderna  SARS-COV2 Booster Vaccination 12/22/2019   Moderna Sars-Covid-2 Vaccination 04/26/2019, 05/24/2019   PFIZER Comirnaty(Gray Top)Covid-19 Tri-Sucrose Vaccine 11/01/2020   PNEUMOCOCCAL CONJUGATE-20 07/12/2022   Pfizer(Comirnaty)Fall Seasonal Vaccine 12 years and older 11/25/2022   Pneumococcal Conjugate-13 07/15/2016   Pneumococcal Polysaccharide-23 12/09/2016   Tdap 08/09/2013, 09/27/2018    Past Medical History:  Diagnosis Date   A-fib (HCC)    Abdominal aortic aneurysm (AAA)    34mm, infrarenal in 2024. 110yr follow up surviellence   Acid reflux    Alcohol  abuse    Allergy    seasonal   Anxiety    Asthma    AVN (avascular necrosis of bone) (HCC)    Blood transfusion without reported diagnosis    during surgery   Chronic back pain    Complex tear of medial meniscus of knee 04/21/2023   COPD (chronic obstructive pulmonary disease) (HCC)    Depression    Hepatitis C    history of   History of urinary frequency    Hyperlipidemia    Hypertension    Peripheral neuropathy    hands and feet   Polysubstance abuse (HCC) 02/09/2010   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HCC)    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HCC)    Seizures (HCC)    15 years ago   Sleep apnea     Tobacco History: Tobacco Use History[2] Ready to quit: Not Answered Counseling given: Not Answered Tobacco comments: 1.5-2ppd  Quit attempt 12/24/2018 0.5 ppd, quit March 2025, resumed April 2025 Currently smoking 1 ppd as of August 2025  Outpatient Medications Prior to Visit  Medication Sig Dispense Refill   aspirin  EC 81 MG tablet Take 1 tablet (81 mg total) by mouth daily. Swallow whole. 30 tablet 12   azelastine  (ASTELIN ) 0.1 % nasal spray Place 1 spray into both nostrils 2 (two) times daily. Use in each nostril as directed 30 mL 1   diclofenac  Sodium (VOLTAREN ) 1 % GEL Apply 2 g topically 4 (four) times daily. 50 g 3   famotidine  (PEPCID ) 20 MG tablet Take 1 tablet (20 mg total) by mouth daily. 30 tablet 2   lidocaine   (LIDODERM ) 5 % Place 1 patch onto the skin daily. Remove & Discard patch within 12 hours or as directed by MD 30 patch 0   loperamide  (IMODIUM ) 2 MG capsule Take 1 capsule (2 mg total) by mouth 2 (two) times daily as needed for diarrhea or loose stools. 14 capsule 0   metoprolol  tartrate (LOPRESSOR ) 25 MG tablet Take 25 mg by mouth 2 (two) times daily.     naltrexone  (DEPADE) 50 MG tablet Take 1 tablet (50 mg total) by mouth daily. 90 tablet 1   olmesartan -hydrochlorothiazide  (BENICAR  HCT) 20-12.5 MG tablet TAKE 1 TABLET BY MOUTH DAILY 90 tablet 1   ondansetron  (ZOFRAN -ODT) 8 MG disintegrating tablet Take 1 tablet (8 mg total) by mouth every 8 (eight) hours as needed for nausea or vomiting. 20 tablet 0   rosuvastatin  (CRESTOR ) 20 MG tablet Take 1 tablet (20 mg total) by mouth daily. 90 tablet 3   sucralfate  (CARAFATE ) 1 g tablet Take 1 g by mouth as directed.     traZODone  (DESYREL ) 50 MG tablet Take 1 tablet (50 mg total) by mouth at bedtime. 90 tablet 3   varenicline  (CHANTIX ) 1 MG tablet Take 1 tablet (1 mg total) by mouth 2 (two) times daily. 60 tablet 2   albuterol  (VENTOLIN  HFA) 108 (90 Base) MCG/ACT inhaler INHALE 1 TO 2 PUFFS INTO THE LUNGS EVERY 6 HOURS AS NEEDED FOR WHEEZING OR SHORTNESS OF BREATH 18 g 3   umeclidinium-vilanterol (ANORO ELLIPTA ) 62.5-25 MCG/ACT AEPB Inhale 1 puff into the lungs daily. 60 each 1   No facility-administered medications prior to visit.     Review of Systems: as per hpi  Constitutional:   No  weight loss, night sweats,  Fevers, chills, fatigue, or  lassitude.  HEENT:   No headaches,  Difficulty swallowing,  Tooth/dental problems, or  Sore throat,                No sneezing, itching, ear ache, nasal congestion, post nasal drip,   CV:  No chest pain,  Orthopnea, PND, swelling in lower extremities, anasarca, dizziness, palpitations, syncope.   GI  No heartburn, indigestion, abdominal pain, nausea, vomiting, diarrhea, change in bowel habits, loss of  appetite, bloody stools.   Resp: No shortness of breath with exertion or at rest.  No excess mucus, no productive cough,  No non-productive cough,  No coughing up of blood.  No change in color of mucus.  No wheezing.  No chest wall deformity  Skin: no rash or lesions.  GU: no dysuria, change in color of urine, no urgency or frequency.  No flank pain, no hematuria   MS:  No joint pain or swelling.  No decreased range of motion.  No back pain.    Physical Exam  BP 127/86   Pulse 92   Ht 5' 8 (1.727 m)   Wt 250 lb (113.4 kg)   SpO2 95%  BMI 38.01 kg/m   GEN: A/Ox3; pleasant , NAD, well nourished    HEENT:  [place]/AT,  EACs-clear, TMs-wnl, NOSE-clear, THROAT-clear, no lesions, no postnasal drip or exudate noted. Mallampati 4  NECK:  Supple w/ fair ROM; no JVD; normal carotid impulses w/o bruits; no thyromegaly or nodules palpated; no lymphadenopathy.    RESP  Clear  P & A; w/o, wheezes/ rales/ or rhonchi. no accessory muscle use, no dullness to percussion  CARD:  RRR, no m/r/g, no peripheral edema, pulses intact, no cyanosis or clubbing.  GI:   Soft & nt; nml bowel sounds; no organomegaly or masses detected.   Musco: Warm bil, no deformities or joint swelling noted.   Neuro: alert, no focal deficits noted.    Skin: Warm, no lesions or rashes    Lab Results:  CBC    Component Value Date/Time   WBC 10.5 02/19/2024 1421   RBC 4.93 02/19/2024 1421   HGB 16.7 02/19/2024 1421   HGB 17.3 04/21/2023 1542   HCT 47.1 02/19/2024 1421   HCT 47.3 04/21/2023 1542   PLT 261 02/19/2024 1421   PLT 211 04/21/2023 1542   MCV 95.5 02/19/2024 1421   MCV 97 04/21/2023 1542   MCH 33.9 (H) 02/19/2024 1421   MCHC 35.5 (H) 02/19/2024 1421   RDW 12.3 02/19/2024 1421   RDW 12.6 04/21/2023 1542   LYMPHSABS 2.4 04/21/2023 1542   MONOABS 0.7 05/02/2022 1054   EOSABS 221 02/19/2024 1421   EOSABS 0.2 04/21/2023 1542   BASOSABS 84 02/19/2024 1421   BASOSABS 0.1 04/21/2023 1542    BMET     Component Value Date/Time   NA 136 02/19/2024 1421   NA 138 04/21/2023 1542   K 3.9 02/19/2024 1421   CL 100 02/19/2024 1421   CO2 25 02/19/2024 1421   GLUCOSE 99 02/19/2024 1421   BUN 14 02/19/2024 1421   BUN 8 04/21/2023 1542   CREATININE 0.72 02/19/2024 1421   CALCIUM  9.4 02/19/2024 1421   GFRNONAA >60 08/24/2023 0953   GFRAA 101 09/08/2019 1707    BNP    Component Value Date/Time   BNP 20.7 09/04/2022 1151    ProBNP No results found for: PROBNP  Imaging: No results found.  Administration History     None           No data to display          No results found for: NITRICOXIDE   Assessment & Plan:   Assessment & Plan Dyspnea, unspecified type  OSA (obstructive sleep apnea)  Chronic obstructive pulmonary disease with emphysema, unspecified emphysema type (HCC)  TOBACCO ABUSE  Lung nodules  Assessment and Plan Assessment & Plan Obstructive sleep apnea Severe obstructive sleep apnea with previous CPAP intolerance. Discussed alternative treatments including the Inspire device. - Ordered home sleep study. - Provided information packet on Inspire device. - Will follow up in 6-8 weeks to discuss sleep study, CPAP compliance and potential Inspire device.  Chronic obstructive pulmonary disease with emphysema COPD with emphysema managed with Anoro and albuterol  inhalers. - Refilled Anoro inhaler. - Refilled albuterol  inhaler.  Lung nodules under surveillance Multiple small lung nodules with a new 4 mm nodule in the right middle lobe. High risk due to smoking history. - Ensure follow-up CT scan is completed in May.  Already ordered per PCP.  Nicotine  dependence, cigarettes Actively trying to quit smoking with Varenicline . Discussed impact of untreated sleep apnea on memory and smoking cessation. - Continue Varenicline  (Chantix ) for smoking  cessation.    Return in about 3 months (around 05/24/2024) for CT results, sleep study review.  Candis Dandy, PA-C 02/24/2024      [1]  Allergies Allergen Reactions   Lisinopril  Swelling and Other (See Comments)    Swelling of the lips  [2]  Social History Tobacco Use  Smoking Status Every Day   Current packs/day: 0.50   Average packs/day: 1 pack/day for 40.0 years (39.6 ttl pk-yrs)   Types: Cigarettes   Start date: 02/12/1984   Passive exposure: Past  Smokeless Tobacco Former   Types: Chew   Quit date: 10/24/2012  Tobacco Comments   1.5-2ppd  Quit attempt 12/24/2018   0.5 ppd, quit March 2025, resumed April 2025   Currently smoking 1 ppd as of August 2025   "

## 2024-02-26 ENCOUNTER — Ambulatory Visit (HOSPITAL_COMMUNITY)
Admission: RE | Admit: 2024-02-26 | Discharge: 2024-02-26 | Disposition: A | Discharge status: Home / Self Care | Payer: MEDICAID | Source: Ambulatory Visit

## 2024-02-26 ENCOUNTER — Encounter (HOSPITAL_COMMUNITY): Payer: Self-pay

## 2024-02-26 VITALS — BP 159/72 | HR 76 | Temp 98.1°F | Resp 16

## 2024-02-26 DIAGNOSIS — S39012A Strain of muscle, fascia and tendon of lower back, initial encounter: Secondary | ICD-10-CM

## 2024-02-26 MED ORDER — KETOROLAC TROMETHAMINE 30 MG/ML IJ SOLN
INTRAMUSCULAR | Status: AC
  Filled 2024-02-26: qty 1

## 2024-02-26 MED ORDER — METHOCARBAMOL 500 MG PO TABS: 500.0000 mg | ORAL_TABLET | Freq: Two times a day (BID) | ORAL | 0 refills | Status: AC

## 2024-02-26 MED ORDER — KETOROLAC TROMETHAMINE 30 MG/ML IJ SOLN
30.0000 mg | Freq: Once | INTRAMUSCULAR | Status: AC
  Administered 2024-02-26: 30 mg via INTRAMUSCULAR

## 2024-02-26 NOTE — ED Triage Notes (Signed)
 Pt has c/o lower back pain x 3 days. Pt reports he was at home lifting a box and hurt his back. Pt has Advil  at 2pm today with some relief and he states the pain is coming back.

## 2024-02-26 NOTE — Discharge Instructions (Addendum)
 Your back pain is likely due to a muscle strain which will improve on its own with time.   We gave you ketorolac  in the clinic today, so do not take any NSAIDs (ibuprofen , aleve , naproxen , goody powders) for 24 hours. This medicine will help with inflammation associated with the pulled muscle.   You may take tylenol  as needed for aches and pains.  Take methocarbamol  (muscle relaxer) as needed for muscle spasm, mostly take this at bedtime as this medicine can cause drowsiness.  Apply heat to the pulled muscle 20 minutes on 20 minutes off as needed, heat relaxes muscles.  Perform gentle exercises and stretches to area of tenderness.  I would like for you to rest, however I do not want you to avoid moving the area. Movement and stretching will help with healing.  Red flag symptoms to watch out for are numbness/tingling to the legs, weakness, loss of bowel/bladder control, and/or worsening pain that does not respond well to medicines.  Follow-up with your primary care provider or return to urgent care if your symptoms do not improve in the next 3 to 4 days with medications and interventions recommended today. If your symptoms are severe (red flag), please go to the emergency room.    If needed, orthopedic information below: [facility] [address] [place], [place]  [phone]

## 2024-02-26 NOTE — ED Provider Notes (Signed)
 " [facility]    CSN: [id number] Arrival date & time: 02/26/24  1722      History   Chief Complaint Chief Complaint  Patient presents with   Back Pain    Am experiencing lower back pain when trying to stand and walking up steps or moving in certain positions. - Entered by patient    HPI MARKE Arnold is a 54 y.o. male.   54 year old male is being seen for complaints of lower back pain.  He reports approximately 3 days ago he was lifting a box and developed low back pain.  He reports bilateral low back pain with some radiation down the left buttocks.  He denies headache, dizziness.  He denies chest pain, shortness of breath.  He denies numbness, tingling, weakness in his extremities.  He denies loss of bowel or bladder function.  He has been taking Advil  with some relief of pain.  Last dose was 2 PM today.   Back Pain Associated symptoms: no chest pain, no fever, no headaches, no numbness and no weakness     Past Medical History:  Diagnosis Date   A-fib (HCC)    Abdominal aortic aneurysm (AAA)    34mm, infrarenal in 2024. 26yr follow up surviellence   Acid reflux    Alcohol  abuse    Allergy    seasonal   Anxiety    Asthma    AVN (avascular necrosis of bone) (HCC)    Blood transfusion without reported diagnosis    during surgery   Chronic back pain    Complex tear of medial meniscus of knee 04/21/2023   COPD (chronic obstructive pulmonary disease) (HCC)    Depression    Hepatitis C    history of   History of urinary frequency    Hyperlipidemia    Hypertension    Peripheral neuropathy    hands and feet   Polysubstance abuse (HCC) 02/09/2010   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HCC)    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HCC)    Seizures (HCC)    15 years ago   Sleep apnea     Patient Active Problem List   Diagnosis Date Noted   High risk medication use 02/19/2024   COPD (chronic obstructive pulmonary disease) (HCC) 09/23/2023   Coronary artery calcification 06/13/2023    Benign paroxysmal positional vertigo of right ear 05/26/2023   Complex tear of medial meniscus of knee 04/21/2023   Osteoarthritis of left knee 04/21/2023   Acute bacterial rhinosinusitis 11/25/2022   Abdominal aortic aneurysm (AAA) 30 to 34 mm in diameter 09/12/2022   GERD (gastroesophageal reflux disease) 09/12/2022   AVN (avascular necrosis of bone) (HCC) 08/02/2021   History of hepatitis C 04/12/2021   Tooth abscess 01/14/2020   Tongue lesion 10/11/2019   Carpal tunnel syndrome 08/24/2019   Onychomycosis 08/24/2019   Cervicalgia 06/02/2019   Abdominal pain 02/28/2019   Hyperlipidemia 09/01/2018   HTN (hypertension) 07/24/2017   Seizure (HCC) 07/24/2017   RA (rheumatoid arthritis) (HCC) 07/24/2017   Degenerative joint disease 12/28/2015   OSA (obstructive sleep apnea) 11/23/2015   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) 07/26/2013   Bipolar disorder, unspecified (HCC) 10/28/2012   Alcohol  dependence (HCC) 04/02/2012   Major depression 04/02/2012   TOBACCO ABUSE 02/09/2010   Polysubstance abuse (HCC) 02/09/2010   FIBRILLATION, ATRIAL 06/08/2009    Past Surgical History:  Procedure Laterality Date   APPENDECTOMY     CARDIOVERSION  03/07/2006   CARPAL  TUNNEL RELEASE     CERVICAL FUSION     LAPAROSCOPIC APPENDECTOMY N/A 07/23/2017   Procedure: APPENDECTOMY LAPAROSCOPIC;  Surgeon: Sebastian Moles, MD;  Location: [facility] OR;  Service: General;  Laterality: N/A;   TOTAL HIP ARTHROPLASTY Right 2016       Home Medications    Prior to Admission medications  Medication Sig Start Date End Date Taking? Authorizing Provider  methocarbamol  (ROBAXIN ) 500 MG tablet Take 1 tablet (500 mg total) by mouth 2 (two) times daily for 7 days. 02/26/24 03/04/24 Yes Lennice Jon BROCKS, FNP  albuterol  (VENTOLIN  HFA) 108 (90 Base) MCG/ACT inhaler Inhale 2 puffs into the lungs every 4 (four) hours as needed for wheezing or shortness of  breath. 02/24/24   Charley Conger, PA-C  aspirin  EC 81 MG tablet Take 1 tablet (81 mg total) by mouth daily. Swallow whole. 05/30/23   Theophilus Pagan, MD  azelastine  (ASTELIN ) 0.1 % nasal spray Place 1 spray into both nostrils 2 (two) times daily. Use in each nostril as directed 11/13/23   Reddick, Johnathan B, NP  diclofenac  Sodium (VOLTAREN ) 1 % GEL Apply 2 g topically 4 (four) times daily. 06/16/23   Christia Budds, MD  famotidine  (PEPCID ) 20 MG tablet Take 1 tablet (20 mg total) by mouth daily. 09/12/22   Theophilus Pagan, MD  lidocaine  (LIDODERM ) 5 % Place 1 patch onto the skin daily. Remove & Discard patch within 12 hours or as directed by MD 06/16/23   Christia Budds, MD  loperamide  (IMODIUM ) 2 MG capsule Take 1 capsule (2 mg total) by mouth 2 (two) times daily as needed for diarrhea or loose stools. 10/20/23   Christopher Savannah, PA-C  metoprolol  tartrate (LOPRESSOR ) 25 MG tablet Take 25 mg by mouth 2 (two) times daily. 05/05/22   [provider]  naltrexone  (DEPADE) 50 MG tablet Take 1 tablet (50 mg total) by mouth daily. 10/29/23   Theophilus Pagan, MD  olmesartan -hydrochlorothiazide  (BENICAR  HCT) 20-12.5 MG tablet TAKE 1 TABLET BY MOUTH DAILY 11/13/23   Theophilus Pagan, MD  ondansetron  (ZOFRAN -ODT) 8 MG disintegrating tablet Take 1 tablet (8 mg total) by mouth every 8 (eight) hours as needed for nausea or vomiting. 10/20/23   Christopher Savannah, PA-C  rosuvastatin  (CRESTOR ) 20 MG tablet Take 1 tablet (20 mg total) by mouth daily. 04/21/23   Theophilus Pagan, MD  sucralfate  (CARAFATE ) 1 g tablet Take 1 g by mouth as directed. 08/18/23   [provider]  traZODone  (DESYREL ) 50 MG tablet Take 1 tablet (50 mg total) by mouth at bedtime. 01/22/24   Raulkar, Sven SQUIBB, MD  umeclidinium-vilanterol (ANORO ELLIPTA ) 62.5-25 MCG/ACT AEPB Inhale 1 puff into the lungs daily. 02/24/24   Charley Conger, PA-C  varenicline  (CHANTIX ) 1 MG tablet Take 1 tablet (1 mg total) by mouth 2 (two) times daily. 09/23/23    McDiarmid, Krystal BIRCH, MD    Family History Family History  Problem Relation Age of Onset   Heart attack Mother    Atrial fibrillation Mother    Lung cancer Mother    Skin cancer Father    Healthy Sister    Healthy Sister    Healthy Brother    Colon polyps Neg Hx    Esophageal cancer Neg Hx    Stomach cancer Neg Hx    Rectal cancer Neg Hx    Colon cancer Neg Hx    Crohn's disease Neg Hx    Ulcerative colitis Neg Hx     Social History Social History[1]   Allergies  Lisinopril    Review of Systems Review of Systems  Constitutional:  Positive for activity change. Negative for chills and fever.  Respiratory:  Negative for shortness of breath.   Cardiovascular:  Negative for chest pain.  Musculoskeletal:  Positive for back pain.  Skin:  Negative for color change and rash.  Neurological:  Negative for dizziness, weakness, numbness and headaches.  All other systems reviewed and are negative.    Physical Exam Triage Vital Signs ED Triage Vitals  Encounter Vitals Group     BP 02/26/24 1756 (!) 159/72     Girls Systolic BP Percentile --      Girls Diastolic BP Percentile --      Boys Systolic BP Percentile --      Boys Diastolic BP Percentile --      Pulse Rate 02/26/24 1756 76     Resp 02/26/24 1756 16     Temp 02/26/24 1756 98.1 F (36.7 C)     Temp Source 02/26/24 1756 Oral     SpO2 02/26/24 1756 94 %     Weight --      Height --      Head Circumference --      Peak Flow --      Pain Score 02/26/24 1754 6     Pain Loc --      Pain Education --      Exclude from Growth Chart --    No data found.  Updated Vital Signs BP (!) 159/72 (BP Location: Right Arm)   Pulse 76   Temp 98.1 F (36.7 C) (Oral)   Resp 16   SpO2 94%   Visual Acuity Right Eye Distance:   Left Eye Distance:   Bilateral Distance:    Right Eye Near:   Left Eye Near:    Bilateral Near:     Physical Exam Vitals and nursing note reviewed.  Constitutional:      General: He is not in  acute distress.    Appearance: He is well-developed.     Comments: Pleasant male appearing stated age found sitting in chair in no acute distress.  Cardiovascular:     Rate and Rhythm: Normal rate and regular rhythm.     Heart sounds: Normal heart sounds. No murmur heard. Pulmonary:     Effort: Pulmonary effort is normal. No respiratory distress.     Breath sounds: Normal breath sounds.  Musculoskeletal:     Cervical back: Normal.     Thoracic back: Normal.     Lumbar back: Tenderness present.  Skin:    General: Skin is warm and dry.  Neurological:     Mental Status: He is alert and oriented to person, place, and time.  Psychiatric:        Mood and Affect: Mood normal.      UC Treatments / Results  Labs (all labs ordered are listed, but only abnormal results are displayed) Labs Reviewed - No data to display  EKG   Radiology No results found.  Procedures Procedures (including critical care time)  Medications Ordered in UC Medications  ketorolac  (TORADOL ) 30 MG/ML injection 30 mg (30 mg Intramuscular Given 02/26/24 1821)    Initial Impression / Assessment and Plan / UC Course  I have reviewed the triage vital signs and the nursing notes.  Pertinent labs & imaging results that were available during my care of the patient were reviewed by me and considered in my medical decision making (see chart for details).  Vitals and triage reviewed, patient is hemodynamically stable.  Presentation consistent with lumbar muscle strain.  He is given ketorolac  in clinic today.  He is advised to avoid additional NSAIDs for 24 hours.  He is given prescription for methocarbamol .  Advised supportive care with Tylenol , heat application, gentle stretches.  He is provided with orthopedic follow-up information if needed.  Plan of care, follow-up care, return precautions given, no questions at this time. Final Clinical Impressions(s) / UC Diagnoses   Final diagnoses:  Strain of lumbar  region, initial encounter     Discharge Instructions      Your back pain is likely due to a muscle strain which will improve on its own with time.   We gave you ketorolac  in the clinic today, so do not take any NSAIDs (ibuprofen , aleve , naproxen , goody powders) for 24 hours. This medicine will help with inflammation associated with the pulled muscle.   You may take tylenol  as needed for aches and pains.  Take methocarbamol  (muscle relaxer) as needed for muscle spasm, mostly take this at bedtime as this medicine can cause drowsiness.  Apply heat to the pulled muscle 20 minutes on 20 minutes off as needed, heat relaxes muscles.  Perform gentle exercises and stretches to area of tenderness.  I would like for you to rest, however I do not want you to avoid moving the area. Movement and stretching will help with healing.  Red flag symptoms to watch out for are numbness/tingling to the legs, weakness, loss of bowel/bladder control, and/or worsening pain that does not respond well to medicines.  Follow-up with your primary care provider or return to urgent care if your symptoms do not improve in the next 3 to 4 days with medications and interventions recommended today. If your symptoms are severe (red flag), please go to the emergency room.    If needed, orthopedic information below: [facility] [address] [place], [place]  [address] [phone]     ED Prescriptions     Medication Sig Dispense Auth. Provider   methocarbamol  (ROBAXIN ) 500 MG tablet Take 1 tablet (500 mg total) by mouth 2 (two) times daily for 7 days. 14 tablet Brielle Moro C, FNP      PDMP not reviewed this encounter.    [1]  Social History Tobacco Use   Smoking status: Every Day    Current packs/day: 0.50    Average packs/day: 1 pack/day for 40.0 years (39.6 ttl pk-yrs)    Types: Cigarettes    Start date: 02/12/1984    Passive exposure: Past   Smokeless tobacco: Former    Types: Chew    Quit date:  10/24/2012   Tobacco comments:    1.5-2ppd  Quit attempt 12/24/2018    0.5 ppd, quit March 2025, resumed April 2025    Currently smoking 1 ppd as of August 2025  Vaping Use   Vaping status: Never Used  Substance Use Topics   Alcohol  use: Yes    Comment: 4-5 beers daily   Drug use: Not Currently    Types: Crack cocaine, Cocaine    Comment: 03/02/23     Lennice Jon BROCKS, FNP 02/26/24 1823  "

## 2024-03-02 ENCOUNTER — Encounter: Payer: Self-pay | Admitting: Internal Medicine

## 2024-03-04 ENCOUNTER — Telehealth: Payer: Self-pay

## 2024-03-04 DIAGNOSIS — M069 Rheumatoid arthritis, unspecified: Secondary | ICD-10-CM

## 2024-03-04 NOTE — Telephone Encounter (Addendum)
 Submitted a Prior Authorization request to [employer] for HUMIRA  via CoverMyMeds. Will update once we receive a response.  KeyBETHA NAO    ----- Message from Daved GORMAN Holstein sent at 03/03/2024  1:06 PM EST ----- Please get this patient restarted on Humira  or bio similar. Thanks!

## 2024-03-08 ENCOUNTER — Other Ambulatory Visit: Payer: MEDICAID

## 2024-03-09 ENCOUNTER — Encounter: Payer: Self-pay | Admitting: Pulmonary Disease

## 2024-03-09 ENCOUNTER — Telehealth (INDEPENDENT_AMBULATORY_CARE_PROVIDER_SITE_OTHER): Payer: MEDICAID | Admitting: Pulmonary Disease

## 2024-03-09 DIAGNOSIS — G4733 Obstructive sleep apnea (adult) (pediatric): Secondary | ICD-10-CM | POA: Diagnosis not present

## 2024-03-09 MED ORDER — HUMIRA (2 PEN) 40 MG/0.4ML ~~LOC~~ AJKT
40.0000 mg | AUTO-INJECTOR | SUBCUTANEOUS | 2 refills | Status: AC
  Filled 2024-03-16: qty 0.8, 28d supply, fill #0

## 2024-03-09 NOTE — Telephone Encounter (Signed)
 Received a fax regarding Prior Authorization from [employer] [place] [employer] for HUMIRA . Authorization has been DENIED because need to attach TB and Hepatitis B surface antigen and Hepatitis B core antibody labs.  Submitted an URGENT appeal to [employer] [place] [employer] for HUMIRA .  Case #: [id number] Appeal phone: [phone] Appeal fax: [phone]

## 2024-03-09 NOTE — Telephone Encounter (Signed)
 Received fax from [facility]. Humira  denial has been OVERTURNED.  Humira  is APPROVED from 03/04/2024 to 03/09/2025  Copay should be $4 through [facility] Rx sent to [facility] for onboarding  Sherry Pennant, PharmD, MPH, BCPS, CPP Clinical Pharmacist

## 2024-03-09 NOTE — Telephone Encounter (Signed)
 Sleep study interpreted: mild OSA identified. Cc'ing ordering provider.  Lamar JINNY Dales, MD

## 2024-03-16 ENCOUNTER — Other Ambulatory Visit: Payer: Self-pay

## 2024-03-16 ENCOUNTER — Other Ambulatory Visit (HOSPITAL_COMMUNITY): Payer: Self-pay

## 2024-03-17 ENCOUNTER — Other Ambulatory Visit: Payer: Self-pay

## 2024-03-18 ENCOUNTER — Encounter (HOSPITAL_COMMUNITY): Payer: Self-pay

## 2024-03-18 ENCOUNTER — Ambulatory Visit (HOSPITAL_COMMUNITY)
Admission: RE | Admit: 2024-03-18 | Discharge: 2024-03-18 | Disposition: A | Discharge status: Home / Self Care | Payer: MEDICAID | Source: Ambulatory Visit | Attending: Family Medicine | Admitting: Family Medicine

## 2024-03-18 VITALS — BP 143/90 | HR 95 | Temp 98.0°F | Resp 18

## 2024-03-18 DIAGNOSIS — J069 Acute upper respiratory infection, unspecified: Secondary | ICD-10-CM

## 2024-03-18 DIAGNOSIS — J441 Chronic obstructive pulmonary disease with (acute) exacerbation: Secondary | ICD-10-CM

## 2024-03-18 LAB — POCT INFLUENZA A/B
Influenza A, POC: NEGATIVE
Influenza B, POC: NEGATIVE

## 2024-03-18 LAB — POC SOFIA SARS ANTIGEN FIA: SARS Coronavirus 2 Ag: NEGATIVE

## 2024-03-18 MED ORDER — PROMETHAZINE-DM 6.25-15 MG/5ML PO SYRP: 5.0000 mL | ORAL_SOLUTION | Freq: Four times a day (QID) | ORAL | 0 refills | Status: AC | PRN

## 2024-03-18 MED ORDER — PREDNISONE 20 MG PO TABS: 40.0000 mg | ORAL_TABLET | Freq: Every day | ORAL | 0 refills | Status: AC

## 2024-03-18 NOTE — ED Provider Notes (Signed)
 " [facility]    CSN: [id number] Arrival date & time: 03/18/24  1211      History   Chief Complaint Chief Complaint  Patient presents with   Nausea    Soar throat, phlegm and weakness. - Entered by patient    HPI Blake Arnold is a 54 y.o. male.   HPI Here for cough and nasal congestion/rhinorrhea and mild sore throat.  He has been feeling tired and having a lot of aching.  He had subjective fever the first day that he had the symptoms.  Symptoms started on the evening of December 3.  No nausea vomiting or diarrhea.  He has felt just a little tight in his chest.  He has not used his rescue inhaler more, but he states the NyQuil has knocked him out sufficiently that he he is uncertain if he has needed it.  He is allergic to lisinopril   Past medical history includes COPD  Past Medical History:  Diagnosis Date   A-fib [facility])    Abdominal aortic aneurysm (AAA)    34mm, infrarenal in 2024. 32yr follow up surviellence   Acid reflux    Alcohol  abuse    Allergy    seasonal   Anxiety    Asthma    AVN (avascular necrosis of bone) (HCC)    Blood transfusion without reported diagnosis    during surgery   Chronic back pain    Complex tear of medial meniscus of knee 04/21/2023   COPD (chronic obstructive pulmonary disease) (HCC)    Depression    Hepatitis C    history of   History of urinary frequency    Hyperlipidemia    Hypertension    Peripheral neuropathy    hands and feet   Polysubstance abuse (HCC) 02/09/2010   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HCC)    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HCC)    Seizures (HCC)    15 years ago   Sleep apnea     Patient Active Problem List   Diagnosis Date Noted   High risk medication use 02/19/2024   COPD (chronic obstructive pulmonary disease) (HCC) 09/23/2023   Coronary artery calcification 06/13/2023   Benign paroxysmal positional vertigo of right ear 05/26/2023   Complex tear of medial meniscus of knee 04/21/2023   Osteoarthritis  of left knee 04/21/2023   Acute bacterial rhinosinusitis 11/25/2022   Abdominal aortic aneurysm (AAA) 30 to 34 mm in diameter 09/12/2022   GERD (gastroesophageal reflux disease) 09/12/2022   AVN (avascular necrosis of bone) (HCC) 08/02/2021   History of hepatitis C 04/12/2021   Tooth abscess 01/14/2020   Tongue lesion 10/11/2019   Carpal tunnel syndrome 08/24/2019   Onychomycosis 08/24/2019   Cervicalgia 06/02/2019   Abdominal pain 02/28/2019   Hyperlipidemia 09/01/2018   HTN (hypertension) 07/24/2017   Seizure (HCC) 07/24/2017   RA (rheumatoid arthritis) (HCC) 07/24/2017   Degenerative joint disease 12/28/2015   OSA (obstructive sleep apnea) 11/23/2015   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) 07/26/2013   Bipolar disorder, unspecified (HCC) 10/28/2012   Alcohol  dependence (HCC) 04/02/2012   Major depression 04/02/2012   TOBACCO ABUSE 02/09/2010   Polysubstance abuse (HCC) 02/09/2010   FIBRILLATION, ATRIAL 06/08/2009    Past Surgical History:  Procedure Laterality Date   APPENDECTOMY     CARDIOVERSION  03/07/2006   CARPAL TUNNEL RELEASE     CERVICAL FUSION     LAPAROSCOPIC APPENDECTOMY N/A 07/23/2017   Procedure: APPENDECTOMY LAPAROSCOPIC;  Surgeon:  Sebastian Moles, MD;  Location: [facility];  Service: General;  Laterality: N/A;   TOTAL HIP ARTHROPLASTY Right 2016       Home Medications    Prior to Admission medications  Medication Sig Start Date End Date Taking? Authorizing Provider  adalimumab  (HUMIRA , 2 PEN,) 40 MG/0.4ML pen Inject 0.4 mLs (40 mg total) into the skin every 14 (fourteen) days. 1 kit - 2 pens 03/09/24  Yes Rice, Lonni ORN, MD  aspirin  EC 81 MG tablet Take 1 tablet (81 mg total) by mouth daily. Swallow whole. 05/30/23  Yes Theophilus Pagan, MD  famotidine  (PEPCID ) 20 MG tablet Take 1 tablet (20 mg total) by mouth daily. 09/12/22  Yes Theophilus Pagan, MD  metoprolol  tartrate (LOPRESSOR ) 25 MG  tablet Take 25 mg by mouth 2 (two) times daily. 05/05/22  Yes [provider]  olmesartan -hydrochlorothiazide  (BENICAR  HCT) 20-12.5 MG tablet TAKE 1 TABLET BY MOUTH DAILY 11/13/23  Yes Theophilus Pagan, MD  predniSONE  (DELTASONE ) 20 MG tablet Take 2 tablets (40 mg total) by mouth daily with breakfast for 5 days. 03/18/24 03/23/24 Yes Vonna Sharlet POUR, MD  promethazine -dextromethorphan (PROMETHAZINE -DM) 6.25-15 MG/5ML syrup Take 5 mLs by mouth 4 (four) times daily as needed for cough. 03/18/24  Yes Vonna Sharlet POUR, MD  rosuvastatin  (CRESTOR ) 20 MG tablet Take 1 tablet (20 mg total) by mouth daily. 04/21/23  Yes Theophilus Pagan, MD  albuterol  (VENTOLIN  HFA) [phone] Base) MCG/ACT inhaler Inhale 2 puffs into the lungs every 4 (four) hours as needed for wheezing or shortness of breath. 02/24/24   Charley Conger, PA-C  azelastine  (ASTELIN ) 0.1 % nasal spray Place 1 spray into both nostrils 2 (two) times daily. Use in each nostril as directed 11/13/23   Reddick, Johnathan B, NP  diclofenac  Sodium (VOLTAREN ) 1 % GEL Apply 2 g topically 4 (four) times daily. 06/16/23   Christia Budds, MD  lidocaine  (LIDODERM ) 5 % Place 1 patch onto the skin daily. Remove & Discard patch within 12 hours or as directed by MD 06/16/23   Christia Budds, MD  loperamide  (IMODIUM ) 2 MG capsule Take 1 capsule (2 mg total) by mouth 2 (two) times daily as needed for diarrhea or loose stools. 10/20/23   Christopher Savannah, PA-C  naltrexone  (DEPADE) 50 MG tablet Take 1 tablet (50 mg total) by mouth daily. 10/29/23   Theophilus Pagan, MD  ondansetron  (ZOFRAN -ODT) 8 MG disintegrating tablet Take 1 tablet (8 mg total) by mouth every 8 (eight) hours as needed for nausea or vomiting. 10/20/23   Christopher Savannah, PA-C  sucralfate  (CARAFATE ) 1 g tablet Take 1 g by mouth as directed. 08/18/23   [provider]  traZODone  (DESYREL ) 50 MG tablet Take 1 tablet (50 mg total) by mouth at bedtime. 01/22/24   Raulkar, Sven SQUIBB, MD  umeclidinium-vilanterol  (ANORO ELLIPTA ) 62.5-25 MCG/ACT AEPB Inhale 1 puff into the lungs daily. 02/24/24   Charley Conger, PA-C  varenicline  (CHANTIX ) 1 MG tablet Take 1 tablet (1 mg total) by mouth 2 (two) times daily. 09/23/23   McDiarmid, Krystal BIRCH, MD    Family History Family History  Problem Relation Age of Onset   Heart attack Mother    Atrial fibrillation Mother    Lung cancer Mother    Skin cancer Father    Healthy Sister    Healthy Sister    Healthy Brother    Colon polyps Neg Hx    Esophageal cancer Neg Hx    Stomach cancer Neg Hx    Rectal cancer  Neg Hx    Colon cancer Neg Hx    Crohn's disease Neg Hx    Ulcerative colitis Neg Hx     Social History Social History[1]   Allergies   Lisinopril    Review of Systems Review of Systems   Physical Exam Triage Vital Signs ED Triage Vitals [03/18/24 1220]  Encounter Vitals Group     BP (!) 143/90     Girls Systolic BP Percentile      Girls Diastolic BP Percentile      Boys Systolic BP Percentile      Boys Diastolic BP Percentile      Pulse Rate 95     Resp 18     Temp 98 F (36.7 C)     Temp Source Oral     SpO2 96 %     Weight      Height      Head Circumference      Peak Flow      Pain Score      Pain Loc      Pain Education      Exclude from Growth Chart    No data found.  Updated Vital Signs BP (!) 143/90 (BP Location: Left Arm)   Pulse 95   Temp 98 F (36.7 C) (Oral)   Resp 18   SpO2 96%   Visual Acuity Right Eye Distance:   Left Eye Distance:   Bilateral Distance:    Right Eye Near:   Left Eye Near:    Bilateral Near:     Physical Exam Vitals reviewed.  Constitutional:      General: He is not in acute distress.    Appearance: He is not ill-appearing, toxic-appearing or diaphoretic.  HENT:     Right Ear: Tympanic membrane and ear canal normal.     Left Ear: Tympanic membrane and ear canal normal.     Nose: Congestion present.     Mouth/Throat:     Mouth: Mucous membranes are moist.     Comments: There  is some clear mucus draining Eyes:     Extraocular Movements: Extraocular movements intact.     Conjunctiva/sclera: Conjunctivae normal.     Pupils: Pupils are equal, round, and reactive to light.  Cardiovascular:     Rate and Rhythm: Normal rate and regular rhythm.     Heart sounds: No murmur heard. Pulmonary:     Effort: No respiratory distress.     Breath sounds: No stridor. No rhonchi or rales.     Comments: No wheezing at the time of exam, but he sounds wheezy when he coughs Musculoskeletal:     Cervical back: Neck supple.  Lymphadenopathy:     Cervical: No cervical adenopathy.  Skin:    Capillary Refill: Capillary refill takes less than 2 seconds.     Coloration: Skin is not jaundiced or pale.  Neurological:     General: No focal deficit present.     Mental Status: He is alert and oriented to person, place, and time.  Psychiatric:        Behavior: Behavior normal.      UC Treatments / Results  Labs (all labs ordered are listed, but only abnormal results are displayed) Labs Reviewed  POCT INFLUENZA A/B  POC SOFIA SARS ANTIGEN FIA    EKG   Radiology No results found.  Procedures Procedures (including critical care time)  Medications Ordered in UC Medications - No data to display  Initial Impression / Assessment and  Plan / UC Course  I have reviewed the triage vital signs and the nursing notes.  Pertinent labs & imaging results that were available during my care of the patient were reviewed by me and considered in my medical decision making (see chart for details).     Testing for flu and COVID is negative. Promethazine  DM cough syrup and prednisone  are sent in for URI symptoms and COPD exacerbation. Final Clinical Impressions(s) / UC Diagnoses   Final diagnoses:  Viral URI  COPD exacerbation (HCC)     Discharge Instructions      Testing for flu and for COVID is negative.  Take prednisone  20 mg--2 daily for 5 days  Take Phenergan  with  dextromethorphan syrup--5 mL or 1 teaspoon every 6 hours as needed for cough  Make sure drinking plenty of fluids.     ED Prescriptions     Medication Sig Dispense Auth. Provider   predniSONE  (DELTASONE ) 20 MG tablet Take 2 tablets (40 mg total) by mouth daily with breakfast for 5 days. 10 tablet Nabria Nevin K, MD   promethazine -dextromethorphan (PROMETHAZINE -DM) 6.25-15 MG/5ML syrup Take 5 mLs by mouth 4 (four) times daily as needed for cough. 118 mL Vonna Sharlet POUR, MD      PDMP not reviewed this encounter.     [1]  Social History Tobacco Use   Smoking status: Every Day    Current packs/day: 0.50    Average packs/day: 1 pack/day for 40.1 years (39.7 ttl pk-yrs)    Types: Cigarettes    Start date: 02/12/1984    Passive exposure: Past   Smokeless tobacco: Former    Types: Chew    Quit date: 10/24/2012   Tobacco comments:    1.5-2ppd  Quit attempt 12/24/2018    0.5 ppd, quit March 2025, resumed April 2025    Currently smoking 1 ppd as of August 2025  Vaping Use   Vaping status: Never Used  Substance Use Topics   Alcohol  use: Yes    Comment: 4-5 beers daily   Drug use: Not Currently    Types: Crack cocaine, Cocaine    Comment: 03/02/23     Vonna Sharlet POUR, MD 03/18/24 1943  "

## 2024-03-18 NOTE — ED Triage Notes (Signed)
 The patient reports sore throat, coughing up phlegm and body aches x 2 days.

## 2024-03-18 NOTE — Discharge Instructions (Signed)
 Testing for flu and for COVID is negative.  Take prednisone  20 mg--2 daily for 5 days  Take Phenergan  with dextromethorphan syrup--5 mL or 1 teaspoon every 6 hours as needed for cough  Make sure drinking plenty of fluids.

## 2024-03-19 NOTE — Progress Notes (Signed)
 Counseled patient that Humira  is a TNF blocking agent.  Counseled patient on purpose, proper use, and adverse effects of Humira .   Patient administered Humira  yesterday. He has taken Humira  in the past and did well on it. Previously experienced side effects to MTX.the most common adverse effects including infections, headache, and injection site reactions.  Reviewed the importance of regular labs while on Humira  therapy.  Will monitor CBC and CMP 1 month after starting and then every 3 months routinely thereafter. Will monitor TB gold annually.  Dose will be for rheumatoid arthritis Humira  40 mg every 14 days.  Currently has viral URI - advised to hold Humira  in 2 weeks if symptoms persistent.  Sherry Pennant, PharmD, MPH, BCPS, CPP Clinical Pharmacist

## 2024-04-27 ENCOUNTER — Encounter: Payer: MEDICAID | Admitting: Physical Medicine and Rehabilitation

## 2024-05-04 ENCOUNTER — Ambulatory Visit: Payer: MEDICAID | Admitting: Internal Medicine

## 2024-05-24 ENCOUNTER — Ambulatory Visit (HOSPITAL_BASED_OUTPATIENT_CLINIC_OR_DEPARTMENT_OTHER): Payer: MEDICAID
